# Patient Record
Sex: Female | Born: 1935 | Race: White | Hispanic: No | Smoking: Former smoker
Health system: Southern US, Community
[De-identification: ages and names within clinical notes are randomized; demographics above are authoritative.]

## PROBLEM LIST (undated history)

## (undated) DIAGNOSIS — B029 Zoster without complications: Secondary | ICD-10-CM

## (undated) DIAGNOSIS — E785 Hyperlipidemia, unspecified: Secondary | ICD-10-CM

## (undated) DIAGNOSIS — M810 Age-related osteoporosis without current pathological fracture: Secondary | ICD-10-CM

## (undated) DIAGNOSIS — E559 Vitamin D deficiency, unspecified: Secondary | ICD-10-CM

## (undated) DIAGNOSIS — C3410 Malignant neoplasm of upper lobe, unspecified bronchus or lung: Secondary | ICD-10-CM

## (undated) DIAGNOSIS — I10 Essential (primary) hypertension: Secondary | ICD-10-CM

## (undated) DIAGNOSIS — J439 Emphysema, unspecified: Secondary | ICD-10-CM

## (undated) DIAGNOSIS — Z902 Acquired absence of lung [part of]: Secondary | ICD-10-CM

## (undated) DIAGNOSIS — I700 Atherosclerosis of aorta: Secondary | ICD-10-CM

## (undated) HISTORY — DX: Emphysema, unspecified: J43.9

## (undated) HISTORY — PX: CARPAL TUNNEL RELEASE: SHX101

## (undated) HISTORY — DX: Vitamin D deficiency, unspecified: E55.9

## (undated) HISTORY — PX: SHOULDER ADHESION RELEASE: SHX773

## (undated) HISTORY — DX: Malignant neoplasm of upper lobe, unspecified bronchus or lung: C34.10

## (undated) HISTORY — DX: Age-related osteoporosis without current pathological fracture: M81.0

## (undated) HISTORY — DX: Zoster without complications: B02.9

## (undated) HISTORY — DX: Hyperlipidemia, unspecified: E78.5

## (undated) HISTORY — DX: Essential (primary) hypertension: I10

## (undated) HISTORY — DX: Acquired absence of lung (part of): Z90.2

## (undated) HISTORY — DX: Atherosclerosis of aorta: I70.0

## (undated) HISTORY — PX: LUNG REMOVAL, PARTIAL: SHX233

---

## 1996-12-04 DIAGNOSIS — C3410 Malignant neoplasm of upper lobe, unspecified bronchus or lung: Secondary | ICD-10-CM

## 1996-12-04 DIAGNOSIS — B029 Zoster without complications: Secondary | ICD-10-CM

## 1996-12-04 HISTORY — DX: Zoster without complications: B02.9

## 1996-12-04 HISTORY — DX: Malignant neoplasm of upper lobe, unspecified bronchus or lung: C34.10

## 1999-09-20 ENCOUNTER — Other Ambulatory Visit: Admission: RE | Admit: 1999-09-20 | Discharge: 1999-09-20 | Payer: Self-pay | Admitting: *Deleted

## 1999-10-05 ENCOUNTER — Encounter: Admission: RE | Admit: 1999-10-05 | Discharge: 1999-10-05 | Payer: Self-pay | Admitting: *Deleted

## 1999-10-05 ENCOUNTER — Encounter: Payer: Self-pay | Admitting: *Deleted

## 1999-10-26 ENCOUNTER — Encounter: Admission: RE | Admit: 1999-10-26 | Discharge: 1999-10-26 | Payer: Self-pay | Admitting: Thoracic Surgery

## 1999-10-26 ENCOUNTER — Encounter: Payer: Self-pay | Admitting: Thoracic Surgery

## 2000-04-26 ENCOUNTER — Encounter: Payer: Self-pay | Admitting: *Deleted

## 2000-04-26 ENCOUNTER — Encounter: Admission: RE | Admit: 2000-04-26 | Discharge: 2000-04-26 | Payer: Self-pay | Admitting: *Deleted

## 2000-07-04 ENCOUNTER — Encounter: Admission: RE | Admit: 2000-07-04 | Discharge: 2000-07-04 | Payer: Self-pay | Admitting: Thoracic Surgery

## 2000-07-04 ENCOUNTER — Encounter: Payer: Self-pay | Admitting: Thoracic Surgery

## 2000-09-19 ENCOUNTER — Other Ambulatory Visit: Admission: RE | Admit: 2000-09-19 | Discharge: 2000-09-19 | Payer: Self-pay | Admitting: *Deleted

## 2000-11-21 ENCOUNTER — Encounter: Admission: RE | Admit: 2000-11-21 | Discharge: 2000-11-21 | Payer: Self-pay | Admitting: Thoracic Surgery

## 2000-11-21 ENCOUNTER — Encounter: Payer: Self-pay | Admitting: Thoracic Surgery

## 2001-05-06 ENCOUNTER — Encounter: Payer: Self-pay | Admitting: Hematology & Oncology

## 2001-05-06 ENCOUNTER — Encounter: Admission: RE | Admit: 2001-05-06 | Discharge: 2001-05-06 | Payer: Self-pay | Admitting: Hematology & Oncology

## 2001-05-22 ENCOUNTER — Encounter: Payer: Self-pay | Admitting: Thoracic Surgery

## 2001-05-22 ENCOUNTER — Encounter: Admission: RE | Admit: 2001-05-22 | Discharge: 2001-05-22 | Payer: Self-pay | Admitting: Thoracic Surgery

## 2001-09-04 ENCOUNTER — Other Ambulatory Visit: Admission: RE | Admit: 2001-09-04 | Discharge: 2001-09-04 | Payer: Self-pay | Admitting: *Deleted

## 2001-10-23 ENCOUNTER — Ambulatory Visit (HOSPITAL_COMMUNITY): Admission: RE | Admit: 2001-10-23 | Discharge: 2001-10-23 | Payer: Self-pay | Admitting: Gastroenterology

## 2001-10-23 ENCOUNTER — Encounter (INDEPENDENT_AMBULATORY_CARE_PROVIDER_SITE_OTHER): Payer: Self-pay | Admitting: Specialist

## 2001-11-19 ENCOUNTER — Encounter: Payer: Self-pay | Admitting: Thoracic Surgery

## 2001-11-19 ENCOUNTER — Encounter: Admission: RE | Admit: 2001-11-19 | Discharge: 2001-11-19 | Payer: Self-pay | Admitting: Thoracic Surgery

## 2001-12-13 ENCOUNTER — Encounter: Admission: RE | Admit: 2001-12-13 | Discharge: 2001-12-13 | Payer: Self-pay | Admitting: Thoracic Surgery

## 2001-12-13 ENCOUNTER — Encounter: Payer: Self-pay | Admitting: Thoracic Surgery

## 2002-01-08 ENCOUNTER — Encounter: Admission: RE | Admit: 2002-01-08 | Discharge: 2002-01-08 | Payer: Self-pay | Admitting: Thoracic Surgery

## 2002-01-08 ENCOUNTER — Encounter: Payer: Self-pay | Admitting: Thoracic Surgery

## 2002-03-11 ENCOUNTER — Encounter: Admission: RE | Admit: 2002-03-11 | Discharge: 2002-03-11 | Payer: Self-pay | Admitting: Thoracic Surgery

## 2002-03-11 ENCOUNTER — Encounter: Payer: Self-pay | Admitting: Thoracic Surgery

## 2002-04-23 ENCOUNTER — Ambulatory Visit (HOSPITAL_COMMUNITY): Admission: RE | Admit: 2002-04-23 | Discharge: 2002-04-23 | Payer: Self-pay | Admitting: Internal Medicine

## 2002-04-23 ENCOUNTER — Encounter: Payer: Self-pay | Admitting: Internal Medicine

## 2002-05-06 ENCOUNTER — Encounter: Payer: Self-pay | Admitting: Internal Medicine

## 2002-05-06 ENCOUNTER — Encounter: Admission: RE | Admit: 2002-05-06 | Discharge: 2002-05-06 | Payer: Self-pay | Admitting: Internal Medicine

## 2002-08-20 ENCOUNTER — Encounter: Payer: Self-pay | Admitting: Thoracic Surgery

## 2002-08-20 ENCOUNTER — Encounter: Admission: RE | Admit: 2002-08-20 | Discharge: 2002-08-20 | Payer: Self-pay | Admitting: Thoracic Surgery

## 2002-10-05 ENCOUNTER — Encounter: Admission: RE | Admit: 2002-10-05 | Discharge: 2002-10-05 | Payer: Self-pay | Admitting: Orthopedic Surgery

## 2002-10-05 ENCOUNTER — Encounter: Payer: Self-pay | Admitting: Orthopedic Surgery

## 2002-10-14 ENCOUNTER — Other Ambulatory Visit: Admission: RE | Admit: 2002-10-14 | Discharge: 2002-10-14 | Payer: Self-pay | Admitting: Internal Medicine

## 2003-02-17 ENCOUNTER — Encounter: Payer: Self-pay | Admitting: Thoracic Surgery

## 2003-02-17 ENCOUNTER — Encounter: Admission: RE | Admit: 2003-02-17 | Discharge: 2003-02-17 | Payer: Self-pay | Admitting: Thoracic Surgery

## 2003-05-19 ENCOUNTER — Encounter: Admission: RE | Admit: 2003-05-19 | Discharge: 2003-05-19 | Payer: Self-pay | Admitting: Internal Medicine

## 2003-05-19 ENCOUNTER — Encounter: Payer: Self-pay | Admitting: Internal Medicine

## 2003-08-19 ENCOUNTER — Encounter: Admission: RE | Admit: 2003-08-19 | Discharge: 2003-08-19 | Payer: Self-pay | Admitting: Thoracic Surgery

## 2003-08-19 ENCOUNTER — Encounter: Payer: Self-pay | Admitting: Thoracic Surgery

## 2003-09-23 ENCOUNTER — Ambulatory Visit (HOSPITAL_BASED_OUTPATIENT_CLINIC_OR_DEPARTMENT_OTHER): Admission: RE | Admit: 2003-09-23 | Discharge: 2003-09-23 | Payer: Self-pay | Admitting: Orthopedic Surgery

## 2003-09-23 ENCOUNTER — Ambulatory Visit (HOSPITAL_COMMUNITY): Admission: RE | Admit: 2003-09-23 | Discharge: 2003-09-23 | Payer: Self-pay | Admitting: Orthopedic Surgery

## 2004-02-17 ENCOUNTER — Encounter: Admission: RE | Admit: 2004-02-17 | Discharge: 2004-02-17 | Payer: Self-pay | Admitting: Thoracic Surgery

## 2004-05-26 ENCOUNTER — Encounter: Admission: RE | Admit: 2004-05-26 | Discharge: 2004-05-26 | Payer: Self-pay | Admitting: Internal Medicine

## 2004-09-29 ENCOUNTER — Encounter: Admission: RE | Admit: 2004-09-29 | Discharge: 2004-09-29 | Payer: Self-pay | Admitting: Thoracic Surgery

## 2004-10-20 ENCOUNTER — Other Ambulatory Visit: Admission: RE | Admit: 2004-10-20 | Discharge: 2004-10-20 | Payer: Self-pay | Admitting: Internal Medicine

## 2005-03-15 ENCOUNTER — Encounter: Admission: RE | Admit: 2005-03-15 | Discharge: 2005-03-15 | Payer: Self-pay | Admitting: Thoracic Surgery

## 2005-06-27 ENCOUNTER — Encounter: Admission: RE | Admit: 2005-06-27 | Discharge: 2005-06-27 | Payer: Self-pay | Admitting: Internal Medicine

## 2005-09-13 ENCOUNTER — Encounter: Admission: RE | Admit: 2005-09-13 | Discharge: 2005-09-13 | Payer: Self-pay | Admitting: Thoracic Surgery

## 2006-03-27 ENCOUNTER — Encounter: Admission: EM | Admit: 2006-03-27 | Discharge: 2006-03-27 | Payer: Self-pay | Admitting: Gastroenterology

## 2006-03-27 ENCOUNTER — Encounter: Admission: RE | Admit: 2006-03-27 | Discharge: 2006-03-27 | Payer: Self-pay | Admitting: Thoracic Surgery

## 2006-07-03 ENCOUNTER — Encounter: Admission: RE | Admit: 2006-07-03 | Discharge: 2006-07-03 | Payer: Self-pay | Admitting: Internal Medicine

## 2006-10-17 ENCOUNTER — Encounter: Admission: RE | Admit: 2006-10-17 | Discharge: 2006-10-17 | Payer: Self-pay | Admitting: Thoracic Surgery

## 2006-11-07 ENCOUNTER — Other Ambulatory Visit: Admission: RE | Admit: 2006-11-07 | Discharge: 2006-11-07 | Payer: Self-pay | Admitting: Internal Medicine

## 2007-04-23 ENCOUNTER — Ambulatory Visit: Payer: Self-pay | Admitting: Thoracic Surgery

## 2007-04-23 ENCOUNTER — Encounter: Admission: RE | Admit: 2007-04-23 | Discharge: 2007-04-23 | Payer: Self-pay | Admitting: Thoracic Surgery

## 2007-08-02 ENCOUNTER — Encounter: Admission: RE | Admit: 2007-08-02 | Discharge: 2007-08-02 | Payer: Self-pay | Admitting: Internal Medicine

## 2007-10-16 ENCOUNTER — Ambulatory Visit: Payer: Self-pay | Admitting: Thoracic Surgery

## 2007-10-16 ENCOUNTER — Encounter: Admission: RE | Admit: 2007-10-16 | Discharge: 2007-10-16 | Payer: Self-pay | Admitting: Thoracic Surgery

## 2008-04-14 ENCOUNTER — Ambulatory Visit: Payer: Self-pay | Admitting: Thoracic Surgery

## 2008-04-14 ENCOUNTER — Encounter: Admission: RE | Admit: 2008-04-14 | Discharge: 2008-04-14 | Payer: Self-pay | Admitting: Thoracic Surgery

## 2008-04-25 ENCOUNTER — Emergency Department (HOSPITAL_COMMUNITY): Admission: EM | Admit: 2008-04-25 | Discharge: 2008-04-25 | Payer: Self-pay | Admitting: Emergency Medicine

## 2008-08-03 ENCOUNTER — Encounter: Admission: RE | Admit: 2008-08-03 | Discharge: 2008-08-03 | Payer: Self-pay | Admitting: Internal Medicine

## 2008-08-04 ENCOUNTER — Ambulatory Visit (HOSPITAL_BASED_OUTPATIENT_CLINIC_OR_DEPARTMENT_OTHER): Admission: RE | Admit: 2008-08-04 | Discharge: 2008-08-04 | Payer: Self-pay | Admitting: Orthopedic Surgery

## 2008-10-07 ENCOUNTER — Ambulatory Visit: Payer: Self-pay

## 2008-10-07 ENCOUNTER — Encounter (INDEPENDENT_AMBULATORY_CARE_PROVIDER_SITE_OTHER): Payer: Self-pay | Admitting: Internal Medicine

## 2008-10-20 ENCOUNTER — Encounter: Admission: RE | Admit: 2008-10-20 | Discharge: 2008-10-20 | Payer: Self-pay | Admitting: Thoracic Surgery

## 2008-10-20 ENCOUNTER — Ambulatory Visit: Payer: Self-pay | Admitting: Thoracic Surgery

## 2008-12-23 ENCOUNTER — Encounter: Admission: RE | Admit: 2008-12-23 | Discharge: 2008-12-23 | Payer: Self-pay | Admitting: Thoracic Surgery

## 2008-12-23 ENCOUNTER — Ambulatory Visit: Payer: Self-pay | Admitting: Thoracic Surgery

## 2009-06-23 ENCOUNTER — Ambulatory Visit: Payer: Self-pay | Admitting: Thoracic Surgery

## 2009-06-23 ENCOUNTER — Encounter: Admission: RE | Admit: 2009-06-23 | Discharge: 2009-06-23 | Payer: Self-pay | Admitting: Thoracic Surgery

## 2009-08-04 ENCOUNTER — Encounter: Admission: RE | Admit: 2009-08-04 | Discharge: 2009-08-04 | Payer: Self-pay | Admitting: Internal Medicine

## 2010-01-19 ENCOUNTER — Ambulatory Visit: Payer: Self-pay | Admitting: Thoracic Surgery

## 2010-01-19 ENCOUNTER — Encounter: Admission: RE | Admit: 2010-01-19 | Discharge: 2010-01-19 | Payer: Self-pay | Admitting: Thoracic Surgery

## 2010-09-01 ENCOUNTER — Encounter: Admission: RE | Admit: 2010-09-01 | Discharge: 2010-09-01 | Payer: Self-pay | Admitting: Internal Medicine

## 2010-12-24 ENCOUNTER — Other Ambulatory Visit: Payer: Self-pay | Admitting: Thoracic Surgery

## 2010-12-24 DIAGNOSIS — R911 Solitary pulmonary nodule: Secondary | ICD-10-CM

## 2011-01-13 ENCOUNTER — Other Ambulatory Visit: Payer: Self-pay | Admitting: Gastroenterology

## 2011-01-18 ENCOUNTER — Ambulatory Visit (INDEPENDENT_AMBULATORY_CARE_PROVIDER_SITE_OTHER): Payer: Medicare Other | Admitting: Thoracic Surgery

## 2011-01-18 ENCOUNTER — Ambulatory Visit
Admission: RE | Admit: 2011-01-18 | Discharge: 2011-01-18 | Disposition: A | Discharge status: Home / Self Care | Payer: Medicare Other | Source: Ambulatory Visit | Attending: Gastroenterology | Admitting: Gastroenterology

## 2011-01-18 ENCOUNTER — Other Ambulatory Visit: Payer: Self-pay

## 2011-01-18 ENCOUNTER — Ambulatory Visit
Admission: RE | Admit: 2011-01-18 | Discharge: 2011-01-18 | Disposition: A | Discharge status: Home / Self Care | Payer: Medicare Other | Source: Ambulatory Visit | Attending: Thoracic Surgery | Admitting: Thoracic Surgery

## 2011-01-18 DIAGNOSIS — R911 Solitary pulmonary nodule: Secondary | ICD-10-CM

## 2011-01-18 DIAGNOSIS — C3410 Malignant neoplasm of upper lobe, unspecified bronchus or lung: Secondary | ICD-10-CM

## 2011-01-18 NOTE — Letter (Signed)
January 18, 2011  Lovenia Kim, DO [address], [place] [place], [place] [address]  Re:  [name], Penny D                  DOB:  [birth year]  Dear Dr. Elisabeth Most:  I saw the patient back today and did a CT scan which we have been doing yearly.  She had lung cancer approximately 10 years ago.  There is no evidence of recurrence.  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  Her blood pressure is 107/67, pulse 100, respirations 20, sats were 98%.  I recommend she get at least a chest x-ray once a year from now on since there is a 3% chance of recurrence cancer, even at this second cancer. I doubt, she has since 10 years, she will not have recurrence of original cancer.  I will refer her back to you for long-term followup.  I appreciate the opportunity of seeing the patient.  Sincerely,  [name], M.D. Electronically Signed  DPB/MEDQ  D:  01/18/2011  T:  01/18/2011  Job:  [id number]

## 2011-01-19 NOTE — Letter (Signed)
January 18, 2011  Lovenia Kim, DO [address], [place] [place], [place] [address]  Re:  [name], Penny D                  DOB:  02-Jun-1933  Dear Dr. Elisabeth Most:  I saw the patient back for a yearly CT scan that shows no evidence of recurrence of the cancer.  The upper abdomen did show moderate-sized duodenum and diverticulum.  She is doing well overall and has had no medical problems.  Her blood pressure was 107/67, pulse 88, respirations 20, and sats were 96%.  I will refer her back to you for long-term followup.  She has a 3% risk for a year for this to develop.  I would suggest she get at least a chest x-ray once a year.  I will be happy to see her again if she develops any future problems.  Sincerely,  [name], M.D. Electronically Signed  DPB/MEDQ  D:  01/18/2011  T:  01/19/2011  Job:  [id number]

## 2011-04-03 ENCOUNTER — Other Ambulatory Visit (HOSPITAL_COMMUNITY)
Admission: RE | Admit: 2011-04-03 | Discharge: 2011-04-03 | Disposition: A | Discharge status: Home / Self Care | Payer: Medicare Other | Source: Ambulatory Visit | Attending: Internal Medicine | Admitting: Internal Medicine

## 2011-04-03 ENCOUNTER — Other Ambulatory Visit: Payer: Self-pay | Admitting: Internal Medicine

## 2011-04-03 DIAGNOSIS — Z01419 Encounter for gynecological examination (general) (routine) without abnormal findings: Secondary | ICD-10-CM | POA: Insufficient documentation

## 2011-04-18 NOTE — Assessment & Plan Note (Signed)
OFFICE VISIT   Keirsey, Michalina D  DOB:  [birth year]                                        Apr 14, 2008  CHART #:  [id number]   Ms. Newbury came today.  She is now 11 years since her surgery.  I  continue to follow her on a biannual basis.  Her CT scan showed no  evidence of recurrence.  Her lungs are clear to auscultation and  percussion.  Heart:  Regular, rate and rhythm.  Her blood pressure is  130/71 and pulse 86,  respirations 18, and saturations were 95%.   I plan to see her back again in 6 months with another chest x-ray as she  prefers to have [place] continue to follow her.   [name], M.D.  Electronically Signed   DPB/MEDQ  D:  04/14/2008  T:  04/14/2008  Job:  [id number]

## 2011-04-18 NOTE — Op Note (Signed)
NAME:  [name], [name]                  ACCOUNT NO.:  [id number]   MEDICAL RECORD NO.:  [id number]          PATIENT TYPE:  AMB   LOCATION:  DSC                          FACILITY:  [facility]   PHYSICIAN:  Cindee Salt, M.D.       DATE OF BIRTH:  [birth year]   DATE OF PROCEDURE:  08/04/2008  DATE OF DISCHARGE:                               OPERATIVE REPORT   PREOPERATIVE DIAGNOSIS:  Carpal tunnel syndrome right hand.   POSTOPERATIVE DIAGNOSIS:  Carpal tunnel syndrome right hand.   OPERATION:  Decompression right median nerve.   SURGEON:  Cindee Salt, MD   ANESTHESIA:  Forearm based IV regional.   HISTORY:  The patient is a 77 year old female with history of carpal  tunnel syndrome, EMG nerve conduction is positive.  This has not  responded to conservative treatment.  She has been referred for  consultation and treatment.  She has elected to undergo surgical  decompression.   Pre, peri, and postoperative course have been discussed along with the  risks and complications.  She is aware that there is no guarantee with  the surgery, possibility of infection, recurrence injury to arteries,  nerves, tendons, incomplete relief of symptoms, and dystrophy.  In the  preoperative area, the patient is seen.  The extremity marked by both  the patient and surgeon.  Antibiotic given.   PROCEDURE:  The patient is brought to the operating room where a forearm  based IV regional anesthetic was carried out without difficulty under  the direction of Dr. Katrinka Blazing.  She was prepped using DuraPrep, supine  position, right arm free.  A time-out was taken.  A longitudinal  incision was made in the palm and carried down through subcutaneous  tissue.  Bleeders were electrocauterized.  Palmar fascia was split.  Superficial palmar arch identified.  The flexor tendon to the ring and  little finger identified.  To the ulnar side of the median nerve, the  carpal retinaculum was incised with sharp dissection.  A right-angle  and  Sewall retractor were placed between skin and forearm fascia.  The  fascia released for approximately a centimeter and half proximal to the  wrist crease under direct vision.  Canal was explored.  No further  lesions were identified.  The wound was irrigated.  The skin closed with  interrupted 5-0 Vicryl Rapide sutures.  A sterile compressive dressing  and splint to the wrist applied.  The patient tolerated the procedure  well and was taken to the recovery room for observation in satisfactory  condition.  She will be discharged home to return to the [facility] in 1 week on Vicodin.           ______________________________  Cindee Salt, M.D.    GK/MEDQ  D:  08/04/2008  T:  08/04/2008  Job:  [id number]   cc:   Lovenia Kim, D.O.

## 2011-04-18 NOTE — Letter (Signed)
October 20, 2008   Lovenia Kim, DO  [address], [place]  [place]  [place] [address]   Re:  [name], Penny D                  DOB:  01/20/1933   Dear Dr. Elisabeth Most:   I saw the patient today for her routine followup.  A chest x-ray showed  a new big nodular density in the right lower lobe.  This was not present  on her previous CT scan.  It is a very nondescript density, so I am not  really sure that it is a cancer or not.  We usually get a CT scan in 6  months, but instead we will just shorten that to 2 months and see her  back at that time with a CT scan.  I did tell her this, but it looks to  me like this is probably or not anything she has had a recent upper  respiratory infection.  I appreciate the opportunity of seeing the  patient.  Her blood pressure was 146/84, pulse 100, respirations 18, and  sats were 94%.  She had some rhinorrhea, but her lungs were clear to  auscultation and percussion.  I will see her back again in 2 months with  a CT scan.   [name], M.[name].  Electronically Signed   DPB/MEDQ  [name]:  10/20/2008  T:  10/20/2008  Job:  [id number]

## 2011-04-18 NOTE — Letter (Signed)
December 23, 2008   Lovenia Kim, DO  [address] [place]  [place]  [place] [address]   Re:  Penny [name], Penny D                  DOB:  April 02, 1933   Dear Dr. Elisabeth Most:   I saw the patient back today.  We repeated a CT scan and as you remember  her chest x-ray showed a questionable new nodular density in the right  lung, so we moved her CT scan up from 6 months to 2 months and it was  stable changes.  No evidence of any recurrence of disease.  I informed  the patient of this.  We will see her again in 6 months with a chest x-  ray.  Her blood pressure is 152/87, pulse 79, respirations 18, and sats  were 98%.  Lungs are clear to auscultation and percussion.   [name], M.[name].  Electronically Signed   DPB/MEDQ  [name]:  12/23/2008  T:  12/24/2008  Job:  [id number]

## 2011-04-18 NOTE — Assessment & Plan Note (Signed)
OFFICE VISIT   Penny [name], Penny [name]  DOB:  Jul 30, 1933                                        October 16, 2007  CHART #:  [id number]   [name] came for follow-up today.  She is now 9 to 10 years since her  surgery.  Her chest x-ray was stable after a right upper lobectomy.  Blood pressure is 160/98, pulse 93, respirations 18, sats were 96%.  Lungs were clear to auscultation and  percussion. Overall she is doing  well.  I plan to see her back again in 6 months with a CT scan of the  chest.   [name], M.[name].  Electronically Signed   DPB/MEDQ  [name]:  10/16/2007  T:  10/17/2007  Job:  [id number]

## 2011-04-18 NOTE — Assessment & Plan Note (Signed)
OFFICE VISIT   Wahab, Naleah D  DOB:  [birth year]                                        June 23, 2009  CHART #:  [id number]   The patient came back today.  Her chest x-ray is stable with no evidence  of recurrence and no other new nodule.  She had a recent nodule that we  saw over the last 6 months that resolved.  Because of this, we will have  to repeat her CT scan again in about 6 months, but she is still doing  well.  She is now 12 years since her resection.   [name], M.D.  Electronically Signed   DPB/MEDQ  D:  06/23/2009  T:  06/24/2009  Job:  [id number]

## 2011-04-18 NOTE — Assessment & Plan Note (Signed)
OFFICE VISIT   [name], Penny [name]  DOB:  1933/07/24                                        January 19, 2010  CHART #:  [id number]   The patient came today, and her CT scan showed no evidence of recurrence  of her cancer.  She is now over 10 years since her surgery.  Her blood  pressure is 145/84, pulse 81, respirations 18, sats were 94%.  She has  had no real major problems since we saw her back.  I plan to see her  back again in 1 year with another CT scan.   [name], M.[name].  Electronically Signed   DPB/MEDQ  [name]:  01/19/2010  T:  01/20/2010  Job:  [id number]

## 2011-04-21 NOTE — Op Note (Signed)
NAME:  Penny [name], Penny [name]                            ACCOUNT NO.:  [id number]   MEDICAL RECORD NO.:  [id number]                   PATIENT TYPE:  AMB   LOCATION:  DSC                                  FACILITY:  [facility]   PHYSICIAN:  Mila Homer. Sherlean Foot, M.[name].              DATE OF BIRTH:  1933-07-04   DATE OF PROCEDURE:  09/23/2003  DATE OF DISCHARGE:                                 OPERATIVE REPORT   SURGEON:  Mila Homer. Sherlean Foot, M.[name].   ASSISTANT:  None.   PREOPERATIVE DIAGNOSIS:  Left shoulder impingement syndrome.   POSTOPERATIVE DIAGNOSIS:  Left shoulder impingement syndrome.   OPERATION/PROCEDURE:  Left shoulder arthroscopy with subacromial  decompression and labral debridement.   INDICATIONS FOR PROCEDURE:  The patient is a 77 year old, status post  injury, and also what appears to be overlying impingement syndrome.  MRI's  were done.  Did not verify a full-thickness rotator cuff tear but tendinosis  and a type 2 acromion.  Informed consent was obtained.   DESCRIPTION OF PROCEDURE:  The patient was taken to the operating room and  administered general anesthesia.  She was then placed in the beach chair  position and left shoulder was prepped and draped in the usual sterile  fashion.  Anterior, posterior and direct lateral portals were created with a  #11 blade, blunt trocar and cannula.  From the posterior portal into the  glenohumeral joint with the camera.  I placed the Gator shaver in the  anterior portal and debrided some frayed labral tearing which went from  approximately the 4 o'clock position to the 12 o'clock position.  There was  also a partial-thickness subscapularis tear which was just some fraying of  the subscapularis tendon which I debrided as well as well as some fraying on  the biceps tendon at the rotator cuff interval.  It appeared that there was  a very [place] tearing of the rotator interval.  This was debrided and I could  not see all the way through.  I did place a  needle with a blue Prolene  stitch through it so I could identify when we got in the subacromial space.  I then went from the posterior portal into the subacromial space and created  the direct lateral portal to go in with the shaver and perform an aggressive  bursectomy.  I found the Prolene stitch and debrided aggressively but it was  not a full-thickness tear that I could see needed to be repaired.  After I  completed the bursectomy, I used the ArthroCare Wand to further debride the  bursa off the undersurface of the acromion.  I then performed an aggressive  anterolateral acromioplasty with a 4.0 mm cylindrical bur.  I did not  violate the AC joint.  I then released the CA ligament with the ArthroCare  debridement wand and debrided it back to a stable piece of tissue.  I then  checked our impingement and our 30/30 test was positive for adequate bone  resection.  I then again went through aggressive range of motion, searching  for any full-thickness tears and could not find any.  So I, therefore,  removed the instruments and fluid, closed with interrupted 4-0 nylon  sutures, dressed with Adaptic 4 x 4's, ABD's, 2-inch silk tape, and a simple  sling.  The patient tolerated the procedure well.  Complications none.  Drains none.                                               Mila Homer. Sherlean Foot, M.[name].   SDL/MEDQ  [name]:  09/23/2003  T:  09/23/2003  Job:  [id number]

## 2011-08-25 ENCOUNTER — Other Ambulatory Visit: Payer: Self-pay | Admitting: Internal Medicine

## 2011-08-25 DIAGNOSIS — Z1231 Encounter for screening mammogram for malignant neoplasm of breast: Secondary | ICD-10-CM

## 2011-09-05 ENCOUNTER — Ambulatory Visit
Admission: RE | Admit: 2011-09-05 | Discharge: 2011-09-05 | Disposition: A | Discharge status: Home / Self Care | Payer: Medicare Other | Source: Ambulatory Visit | Attending: Internal Medicine | Admitting: Internal Medicine

## 2011-09-05 DIAGNOSIS — Z1231 Encounter for screening mammogram for malignant neoplasm of breast: Secondary | ICD-10-CM

## 2011-09-06 LAB — POCT HEMOGLOBIN-HEMACUE: Hemoglobin: 13.4

## 2012-04-02 ENCOUNTER — Ambulatory Visit (HOSPITAL_COMMUNITY)
Admission: RE | Admit: 2012-04-02 | Discharge: 2012-04-02 | Disposition: A | Discharge status: Home / Self Care | Payer: Medicare Other | Source: Ambulatory Visit | Attending: Internal Medicine | Admitting: Internal Medicine

## 2012-04-02 ENCOUNTER — Other Ambulatory Visit (HOSPITAL_COMMUNITY): Payer: Self-pay | Admitting: Internal Medicine

## 2012-04-02 DIAGNOSIS — Z85118 Personal history of other malignant neoplasm of bronchus and lung: Secondary | ICD-10-CM | POA: Insufficient documentation

## 2012-04-02 DIAGNOSIS — I10 Essential (primary) hypertension: Secondary | ICD-10-CM

## 2012-07-09 ENCOUNTER — Ambulatory Visit (HOSPITAL_COMMUNITY)
Admission: RE | Admit: 2012-07-09 | Discharge: 2012-07-09 | Disposition: A | Discharge status: Home / Self Care | Payer: Medicare Other | Source: Ambulatory Visit | Attending: Internal Medicine | Admitting: Internal Medicine

## 2012-07-09 ENCOUNTER — Other Ambulatory Visit (HOSPITAL_COMMUNITY): Payer: Self-pay | Admitting: Internal Medicine

## 2012-07-09 DIAGNOSIS — M545 Low back pain, unspecified: Secondary | ICD-10-CM | POA: Insufficient documentation

## 2012-07-09 DIAGNOSIS — G9589 Other specified diseases of spinal cord: Secondary | ICD-10-CM | POA: Insufficient documentation

## 2012-09-20 ENCOUNTER — Other Ambulatory Visit: Payer: Self-pay | Admitting: Internal Medicine

## 2012-09-20 DIAGNOSIS — Z1231 Encounter for screening mammogram for malignant neoplasm of breast: Secondary | ICD-10-CM

## 2012-10-22 ENCOUNTER — Ambulatory Visit
Admission: RE | Admit: 2012-10-22 | Discharge: 2012-10-22 | Disposition: A | Discharge status: Home / Self Care | Payer: Medicare Other | Source: Ambulatory Visit | Attending: Internal Medicine | Admitting: Internal Medicine

## 2012-10-22 DIAGNOSIS — Z1231 Encounter for screening mammogram for malignant neoplasm of breast: Secondary | ICD-10-CM

## 2013-04-09 ENCOUNTER — Ambulatory Visit (HOSPITAL_COMMUNITY)
Admission: RE | Admit: 2013-04-09 | Discharge: 2013-04-09 | Disposition: A | Discharge status: Home / Self Care | Payer: Medicare Other | Source: Ambulatory Visit | Attending: Internal Medicine | Admitting: Internal Medicine

## 2013-04-09 ENCOUNTER — Other Ambulatory Visit (HOSPITAL_COMMUNITY): Payer: Self-pay | Admitting: Internal Medicine

## 2013-04-09 DIAGNOSIS — I10 Essential (primary) hypertension: Secondary | ICD-10-CM

## 2013-09-27 ENCOUNTER — Encounter: Payer: Self-pay | Admitting: Internal Medicine

## 2013-09-29 LAB — HM DEXA SCAN: HM Dexa Scan: 2011

## 2013-09-29 LAB — HM COLONOSCOPY: HM Colonoscopy: 2006

## 2013-09-29 LAB — HM MAMMOGRAPHY: HM Mammogram: 2013

## 2013-10-06 ENCOUNTER — Other Ambulatory Visit: Payer: Self-pay

## 2013-10-06 DIAGNOSIS — Z1231 Encounter for screening mammogram for malignant neoplasm of breast: Secondary | ICD-10-CM

## 2013-10-09 ENCOUNTER — Ambulatory Visit: Payer: Medicare Other | Admitting: Internal Medicine

## 2013-10-09 ENCOUNTER — Encounter (INDEPENDENT_AMBULATORY_CARE_PROVIDER_SITE_OTHER): Payer: Self-pay

## 2013-10-09 ENCOUNTER — Encounter: Payer: Self-pay | Admitting: Internal Medicine

## 2013-10-09 VITALS — BP 114/72 | HR 64 | Temp 97.9°F | Resp 18 | Ht 62.0 in | Wt 145.2 lb

## 2013-10-09 DIAGNOSIS — Z79899 Other long term (current) drug therapy: Secondary | ICD-10-CM

## 2013-10-09 DIAGNOSIS — E782 Mixed hyperlipidemia: Secondary | ICD-10-CM

## 2013-10-09 DIAGNOSIS — E559 Vitamin D deficiency, unspecified: Secondary | ICD-10-CM

## 2013-10-09 DIAGNOSIS — I10 Essential (primary) hypertension: Secondary | ICD-10-CM

## 2013-10-09 DIAGNOSIS — R7309 Other abnormal glucose: Secondary | ICD-10-CM

## 2013-10-09 LAB — LIPID PANEL
HDL: 45 mg/dL (ref >39)
Total CHOL/HDL Ratio: 3.5 Ratio
VLDL: 36 mg/dL (ref 0–40)

## 2013-10-09 LAB — CBC WITH DIFFERENTIAL/PLATELET
Basophils Absolute: 0 10*3/uL (ref 0.0–0.1)
HCT: 38.6 % (ref 36.0–46.0)
Lymphocytes Relative: 33 % (ref 12–46)
Lymphs Abs: 1.6 10*3/uL (ref 0.7–4.0)
Neutro Abs: 2.5 10*3/uL (ref 1.7–7.7)
Platelets: 181 10*3/uL (ref 150–400)
RBC: 4.59 MIL/uL (ref 3.87–5.11)
RDW: 14.2 % (ref 11.5–15.5)
WBC: 4.8 10*3/uL (ref 4.0–10.5)

## 2013-10-09 LAB — MAGNESIUM: Magnesium: 1.8 mg/dL (ref 1.5–2.5)

## 2013-10-09 LAB — BASIC METABOLIC PANEL WITH GFR
BUN: 21 mg/dL (ref 6–23)
CO2: 32 mEq/L (ref 19–32)
Chloride: 103 mEq/L (ref 96–112)
Creat: 0.68 mg/dL (ref 0.50–1.10)
GFR, Est Non African American: 84 mL/min
Potassium: 3.7 mEq/L (ref 3.5–5.3)

## 2013-10-09 LAB — HEPATIC FUNCTION PANEL
ALT: 12 U/L (ref 0–35)
AST: 20 U/L (ref 0–37)
Albumin: 3.9 g/dL (ref 3.5–5.2)
Bilirubin, Direct: 0.1 mg/dL (ref 0.0–0.3)
Total Bilirubin: 0.3 mg/dL (ref 0.3–1.2)

## 2013-10-09 LAB — HEMOGLOBIN A1C: Hgb A1c MFr Bld: 6.1 % — ABNORMAL HIGH (ref <5.7)

## 2013-10-09 NOTE — Patient Instructions (Signed)
Diet and meds as discussed - further disposition pending labs  Vitamin D Deficiency Vitamin D is an important vitamin that your body needs. Having too little of it in your body is called a deficiency. A very bad deficiency can make your bones soft and can cause a condition called rickets.  Vitamin D is important to your body for different reasons, such as:   It helps your body absorb 2 minerals called calcium and phosphorus.  It helps make your bones healthy.  It may prevent some diseases, such as diabetes and multiple sclerosis.  It helps your muscles and heart. You can get vitamin D in several ways. It is a natural part of some foods. The vitamin is also added to some dairy products and cereals. Some people take vitamin D supplements. Also, your body makes vitamin D when you are in the sun. It changes the sun's rays into a form of the vitamin that your body can use. CAUSES   Not eating enough foods that contain vitamin D.  Not getting enough sunlight.  Having certain digestive system diseases that make it hard to absorb vitamin D. These diseases include Crohn's disease, chronic pancreatitis, and cystic fibrosis.  Having a surgery in which part of the stomach or Bauder intestine is removed.  Being obese. Fat cells pull vitamin D out of your blood. That means that obese people may not have enough vitamin D left in their blood and in other body tissues.  Having chronic kidney or liver disease. RISK FACTORS Risk factors are things that make you more likely to develop a vitamin D deficiency. They include:  Being older.  Not being able to get outside very much.  Living in a nursing home.  Having had broken bones.  Having weak or thin bones (osteoporosis).  Having a disease or condition that changes how your body absorbs vitamin D.  Having dark skin.  Some medicines such as seizure medicines or steroids.  Being overweight or obese. SYMPTOMS Mild cases of vitamin D deficiency  may not have any symptoms. If you have a very bad case, symptoms may include:  Bone pain.  Muscle pain.  Falling often.  Broken bones caused by a minor injury, due to osteoporosis. DIAGNOSIS A blood test is the best way to tell if you have a vitamin D deficiency. TREATMENT Vitamin D deficiency can be treated in different ways. Treatment for vitamin D deficiency depends on what is causing it. Options include:  Taking vitamin D supplements.  Taking a calcium supplement. Your caregiver will suggest what dose is best for you. HOME CARE INSTRUCTIONS  Take any supplements that your caregiver prescribes. Follow the directions carefully. Take only the suggested amount.  Have your blood tested 2 months after you start taking supplements.  Eat foods that contain vitamin D. Healthy choices include:  Fortified dairy products, cereals, or juices. Fortified means vitamin D has been added to the food. Check the label on the package to be sure.  Fatty fish like salmon or trout.  Eggs.  Oysters.  Do not use a tanning bed.  Keep your weight at a healthy level. Lose weight if you need to.  Keep all follow-up appointments. Your caregiver will need to perform blood tests to make sure your vitamin D deficiency is going away. SEEK MEDICAL CARE IF:  You have any questions about your treatment.  You continue to have symptoms of vitamin D deficiency.  You have nausea or vomiting.  You are constipated.  You  feel confused.  You have severe abdominal or back pain. MAKE SURE YOU:  Understand these instructions.  Will watch your condition.  Will get help right away if you are not doing well or get worse. Document Released: 02/12/2012 Document Revised: 03/17/2013 Document Reviewed: 02/12/2012 [facility] Patient Information 2014 [place], [place]. Diabetes and Exercise Exercising regularly is important. It is not just about losing weight. It has many health benefits, such as:  Improving  your overall fitness, flexibility, and endurance.  Increasing your bone density.  Helping with weight control.  Decreasing your body fat.  Increasing your muscle strength.  Reducing stress and tension.  Improving your overall health. People with diabetes who exercise gain additional benefits because exercise:  Reduces appetite.  Improves the body's use of blood sugar (glucose).  Helps lower or control blood glucose.  Decreases blood pressure.  Helps control blood lipids (such as cholesterol and triglycerides).  Improves the body's use of the hormone insulin by:  Increasing the body's insulin sensitivity.  Reducing the body's insulin needs.  Decreases the risk for heart disease because exercising:  Lowers cholesterol and triglycerides levels.  Increases the levels of good cholesterol (such as high-density lipoproteins [HDL]) in the body.  Lowers blood glucose levels. YOUR ACTIVITY PLAN  Choose an activity that you enjoy and set realistic goals. Your health care provider or diabetes educator can help you make an activity plan that works for you. You can break activities into 2 or 3 sessions throughout the day. Doing so is as good as one long session. Exercise ideas include:  Taking the dog for a walk.  Taking the stairs instead of the elevator.  Dancing to your favorite song.  Doing your favorite exercise with a friend. RECOMMENDATIONS FOR EXERCISING WITH TYPE 1 OR TYPE 2 DIABETES   Check your blood glucose before exercising. If blood glucose levels are greater than 240 mg/dL, check for urine ketones. Do not exercise if ketones are present.  Avoid injecting insulin into areas of the body that are going to be exercised. For example, avoid injecting insulin into:  The arms when playing tennis.  The legs when jogging.  Keep a record of:  Food intake before and after you exercise.  Expected peak times of insulin action.  Blood glucose levels before and after  you exercise.  The type and amount of exercise you have done.  Review your records with your health care provider. Your health care provider will help you to develop guidelines for adjusting food intake and insulin amounts before and after exercising.  If you take insulin or oral hypoglycemic agents, watch for signs and symptoms of hypoglycemia. They include:  Dizziness.  Shaking.  Sweating.  Chills.  Confusion.  Drink plenty of water while you exercise to prevent dehydration or heat stroke. Body water is lost during exercise and must be replaced.  Talk to your health care provider before starting an exercise program to make sure it is safe for you. Remember, almost any type of activity is better than none. Document Released: 02/10/2004 Document Revised: 07/23/2013 Document Reviewed: 04/29/2013 [facility] Patient Information 2014 [place], [place]. Hypertension As your heart beats, it forces blood through your arteries. This force is your blood pressure. If the pressure is too high, it is called hypertension (HTN) or high blood pressure. HTN is dangerous because you may have it and not know it. High blood pressure may mean that your heart has to work harder to pump blood. Your arteries may be narrow or stiff.  The extra work puts you at risk for heart disease, stroke, and other problems.  Blood pressure consists of two numbers, a higher number over a lower, 110/72, for example. It is stated as "110 over 72." The ideal is below 120 for the top number (systolic) and under 80 for the bottom (diastolic). Write down your blood pressure today. You should pay close attention to your blood pressure if you have certain conditions such as:  Heart failure.  Prior heart attack.  Diabetes  Chronic kidney disease.  Prior stroke.  Multiple risk factors for heart disease. To see if you have HTN, your blood pressure should be measured while you are seated with your arm held at the level of the  heart. It should be measured at least twice. A one-time elevated blood pressure reading (especially in the Emergency Department) does not mean that you need treatment. There may be conditions in which the blood pressure is different between your right and left arms. It is important to see your caregiver soon for a recheck. Most people have essential hypertension which means that there is not a specific cause. This type of high blood pressure may be lowered by changing lifestyle factors such as:  Stress.  Smoking.  Lack of exercise.  Excessive weight.  Drug/tobacco/alcohol use.  Eating less salt. Most people do not have symptoms from high blood pressure until it has caused damage to the body. Effective treatment can often prevent, delay or reduce that damage. TREATMENT  When a cause has been identified, treatment for high blood pressure is directed at the cause. There are a large number of medications to treat HTN. These fall into several categories, and your caregiver will help you select the medicines that are best for you. Medications may have side effects. You should review side effects with your caregiver. If your blood pressure stays high after you have made lifestyle changes or started on medicines,   Your medication(s) may need to be changed.  Other problems may need to be addressed.  Be certain you understand your prescriptions, and know how and when to take your medicine.  Be sure to follow up with your caregiver within the time frame advised (usually within two weeks) to have your blood pressure rechecked and to review your medications.  If you are taking more than one medicine to lower your blood pressure, make sure you know how and at what times they should be taken. Taking two medicines at the same time can result in blood pressure that is too low. SEEK IMMEDIATE MEDICAL CARE IF:  You develop a severe headache, blurred or changing vision, or confusion.  You have unusual  weakness or numbness, or a faint feeling.  You have severe chest or abdominal pain, vomiting, or breathing problems. MAKE SURE YOU:   Understand these instructions.  Will watch your condition.  Will get help right away if you are not doing well or get worse. Document Released: 11/20/2005 Document Revised: 02/12/2012 Document Reviewed: 07/10/2008 [facility] Patient Information 2014 [place], [place]. Cholesterol Cholesterol is a white, waxy, fat-like protein needed by your body in [place] amounts. The liver makes all the cholesterol you need. It is carried from the liver by the blood through the blood vessels. Deposits (plaque) may build up on blood vessel walls. This makes the arteries narrower and stiffer. Plaque increases the risk for heart attack and stroke. You cannot feel your cholesterol level even if it is very high. The only way to know is by a  blood test to check your lipid (fats) levels. Once you know your cholesterol levels, you should keep a record of the test results. Work with your caregiver to to keep your levels in the desired range. WHAT THE RESULTS MEAN:  Total cholesterol is a rough measure of all the cholesterol in your blood.  LDL is the so-called bad cholesterol. This is the type that deposits cholesterol in the walls of the arteries. You want this level to be low.  HDL is the good cholesterol because it cleans the arteries and carries the LDL away. You want this level to be high.  Triglycerides are fat that the body can either burn for energy or store. High levels are closely linked to heart disease. DESIRED LEVELS:  Total cholesterol below 200.  LDL below 100 for people at risk, below 70 for very high risk.  HDL above 50 is good, above 60 is best.  Triglycerides below 150. HOW TO LOWER YOUR CHOLESTEROL:  Diet.  Choose fish or white meat chicken and [place], roasted or baked. Limit fatty cuts of red meat, fried foods, and processed meats, such as sausage and lunch  meat.  Eat lots of fresh fruits and vegetables. Choose whole grains, beans, pasta, potatoes and cereals.  Use only [place] amounts of olive, corn or canola oils. Avoid butter, mayonnaise, shortening or palm kernel oils. Avoid foods with trans-fats.  Use skim/nonfat milk and low-fat/nonfat yogurt and cheeses. Avoid whole milk, cream, ice cream, egg yolks and cheeses. Healthy desserts include angel food cake, ginger snaps, animal crackers, hard candy, popsicles, and low-fat/nonfat frozen yogurt. Avoid pastries, cakes, pies and cookies.  Exercise.  A regular program helps decrease LDL and raises HDL.  Helps with weight control.  Do things that increase your activity level like gardening, walking, or taking the stairs.  Medication.  May be prescribed by your caregiver to help lowering cholesterol and the risk for heart disease.  You may need medicine even if your levels are normal if you have several risk factors. HOME CARE INSTRUCTIONS   Follow your diet and exercise programs as suggested by your caregiver.  Take medications as directed.  Have blood work done when your caregiver feels it is necessary. MAKE SURE YOU:   Understand these instructions.  Will watch your condition.  Will get help right away if you are not doing well or get worse. Document Released: 08/15/2001 Document Revised: 02/12/2012 Document Reviewed: 02/05/2008 [facility] Patient Information 2014 [place], [place].

## 2013-10-09 NOTE — Progress Notes (Signed)
Patient ID: Penny [name], female   DOB: 1933/03/31, 79 y.o.   MRN: [id number]  HPI: Patient presents for 3 month follow up with hypertension, hyperlipidemia, pre-diabetes and vitamin D deficiency.  BP has been controlled at home, today's BP 114/72. Patient denies chest pain, palpitations, dyspnea/orthopnea/PND, dizziness, claudication, or edema. Dyslipidemia is controlled with diet & is on meds. Denies myalgias w/o med SE's. her last cholesterol was 164,HDL 59 and LDL 92. Also; patient has prediabetes/insulin resistance with last A1c of 5.7%.  She denies any diabetic polys, paresthesias, or visual blurring. She also has a Hx/o vitamin D deficiency with pt supplementing same.  Current outpatient prescriptions:aspirin 81 MG chewable tablet, Chew 81 mg by mouth daily., Disp: , Rfl: ;   Biotin 10 MG TABS, Take 10 mg by mouth., Disp: , Rfl: ;   cetirizine (ZYRTEC) 10 MG tablet, Take 10 mg by mouth daily., Disp: , Rfl: ;   cholecalciferol (VITAMIN D) 5000 UNITS tablet, Take 10,000 Units by mouth 4 (four) times a week and 5,000 u 3x/wk, Disp: , Rfl: ;   hydrochlorothiazide (HYDRODIURIL) 25 MG tablet, Take 25 mg by mouth daily., Disp: , Rfl:  magnesium gluconate (MAGONATE) 500 MG tablet, Take 500 mg by mouth 1 day or 1 dose., Disp: , Rfl: ;   metoprolol (LOPRESSOR) 50 MG tablet, Take 50 mg by mouth 2 (two) times daily., Disp: , Rfl: ;  mometasone (NASONEX) 50 MCG/ACT nasal spray, Place 2 sprays into the nose 2 (two) times daily as needed., Disp: , Rfl: ;   simvastatin (ZOCOR) 40 MG tablet, Take 40 mg by mouth every evening., Disp: , Rfl:  loperamide (IMODIUM) 2 MG capsule, Take 2 mg by mouth 3 (three) times a week., Disp: , Rfl:    ALLERGIES Ace inhibitors; Calcium-containing compounds; and Lactose intolerance (gi)  Systems Review: Constitutional: Denies fever, chills, wt changes, headaches, insomnia, fatigue, night sweats, change in appetite. Eyes: Denies redness, blurred vision, diplopia, discharge,  itchy, watery eyes.  ENT: Denies discharge, congestion, post nasal drip, epistaxis, sore throat, earache, hearing loss, dental pain, Tinnitus, Vertigo, Sinus pain, snoring.  CV: Denies chest pain, palpitations, irregular heartbeat, syncope, dyspnea, diaphoresis, orthopnea, PND, claudication, edema. Occasional dry cough and hoarseness she attributes to allergies Respiratory: denies cough, dyspnea, DOE, pleurisy, laryngitis, wheezing.  Gastrointestinal: Denies dysphagia, oodynophagia, heartburn, reflux, waterbrash, pain, cramps, nausea, vomiting, bloating, diarrhea, constipation, hematemesis, melena, hematochezia. Musculoskeletal: Denies arthralgias, myalgias, stiffness, Jt. Swelling, pain, limp, strain/sprain.  Skin: Denies pruritus, rash, hives, warts, acne, eczema, change in in skin lesion(s) Neuro: No weakness, tremor, incoordination, spasms, paresthesia, pain Psychiatric: Denies confusion, memory loss, sensory loss Endo: Denies change in weight, skin, hair change, nocturia, diabetic polys, paresthesias, visual blurring, hyper/hypo-glycemic episodes.  Heme/Lymph: Excessive bleeding, bruising, enlarged lymph nodes PMHx: FHx: Reviewed / unchanged SHx: Reviewed / unchanged Filed Vitals:   10/09/13 1224  BP: 114/72  Pulse: 64  Temp: 97.9 F (36.6 C)  Resp: 18   On Exam: Appears well nourished - in NAD. Eyes: PERRLA, EOMs, conjunctiva no swelling or erythema, normal fundi and vessels. Sinuses: No Frontal/maxillary tenderness ENT/Mouth: EACs clear, TMs nl w/o erythema, bulging. Nares clear w/o erythema, swelling, exudates. OroPharynx clear. No ulcers, cracking, on lips. Dentition- unremarkable. No erythema. Tongue normal, non obstructing. Tonsils not swollen or erythematous. Hearing intact.  Neck: Supple. Car 2+/2+. Thyroid nl. No LN, bruits or JVD. Chest: Respirations nl, BS equal w/o  rales, rhonci, wheezing or stridor.  Cardio: Heart sounds normal, regular rate and rhythm without  sig.  murmurs, gallops,clicks, or rubs. Peripheral pulses brisk and equal bilaterally, without edema.  Abdomen: Flat, soft, with bowl sounds. Non-tender,  no guarding, rebound, hernias, masses, or organomegaly.  Lymphatics: Non tender without lymphadenopathy.  Musculoskeletal: Full ROM all peripheral extremities, joint stability, 5/5 strength, and normal gait.  Skin: Warm, dry without rashes, lesions, ecchymosis.  Neuro: Cranial nerves intact, reflexes equal bilaterally. Sensory-motor testing grossly intact. Tendon reflexes grossly intact.  Pysch: Alert & oriented x 3. Insight and judgement nl & appropriate. No ideations Assessment and Plan: 1. Hypertension: Continue medication, monitor blood pressure at home. Continue diet/meds. 2. Cholesterol: Continue diet/meds, exercise,& lifestyle modifications. Continue monitor cholesterol/LFTs.  3. Pre-diabetes/Diabesity- Continue diet, exercise, lifestyle modifications. Monitor appropriate labs. 4. Vitamin D Deficiency- continue supplements

## 2013-10-10 LAB — INSULIN, FASTING: Insulin fasting, serum: 15 u[IU]/mL (ref 3–28)

## 2013-10-10 LAB — VITAMIN D 25 HYDROXY (VIT D DEFICIENCY, FRACTURES): Vit D, 25-Hydroxy: 90 ng/mL — ABNORMAL HIGH (ref 30–89)

## 2013-10-13 ENCOUNTER — Other Ambulatory Visit: Payer: Self-pay | Admitting: Internal Medicine

## 2013-10-13 DIAGNOSIS — I10 Essential (primary) hypertension: Secondary | ICD-10-CM

## 2013-10-13 MED ORDER — HYDROCHLOROTHIAZIDE 25 MG PO TABS: 25.0000 mg | ORAL_TABLET | Freq: Every day | ORAL | Status: DC

## 2013-11-11 ENCOUNTER — Ambulatory Visit
Admission: RE | Admit: 2013-11-11 | Discharge: 2013-11-11 | Disposition: A | Discharge status: Home / Self Care | Payer: Medicare Other | Source: Ambulatory Visit

## 2013-11-11 DIAGNOSIS — Z1231 Encounter for screening mammogram for malignant neoplasm of breast: Secondary | ICD-10-CM

## 2014-01-08 ENCOUNTER — Other Ambulatory Visit: Payer: Self-pay | Admitting: Internal Medicine

## 2014-01-09 ENCOUNTER — Ambulatory Visit (INDEPENDENT_AMBULATORY_CARE_PROVIDER_SITE_OTHER): Payer: Medicare Other | Admitting: Emergency Medicine

## 2014-01-09 ENCOUNTER — Encounter: Payer: Self-pay | Admitting: Emergency Medicine

## 2014-01-09 VITALS — BP 124/80 | HR 72 | Temp 98.0°F | Resp 18 | Ht 62.5 in | Wt 140.0 lb

## 2014-01-09 DIAGNOSIS — I10 Essential (primary) hypertension: Secondary | ICD-10-CM

## 2014-01-09 DIAGNOSIS — J309 Allergic rhinitis, unspecified: Secondary | ICD-10-CM

## 2014-01-09 DIAGNOSIS — E785 Hyperlipidemia, unspecified: Secondary | ICD-10-CM

## 2014-01-09 DIAGNOSIS — R7309 Other abnormal glucose: Secondary | ICD-10-CM

## 2014-01-09 DIAGNOSIS — E559 Vitamin D deficiency, unspecified: Secondary | ICD-10-CM

## 2014-01-09 DIAGNOSIS — C3410 Malignant neoplasm of upper lobe, unspecified bronchus or lung: Secondary | ICD-10-CM

## 2014-01-09 DIAGNOSIS — E782 Mixed hyperlipidemia: Secondary | ICD-10-CM

## 2014-01-09 LAB — CBC WITH DIFFERENTIAL/PLATELET
BASOS ABS: 0 10*3/uL (ref 0.0–0.1)
Basophils Relative: 0 % (ref 0–1)
EOS PCT: 4 % (ref 0–5)
Eosinophils Absolute: 0.2 10*3/uL (ref 0.0–0.7)
HEMATOCRIT: 40.6 % (ref 36.0–46.0)
HEMOGLOBIN: 13.1 g/dL (ref 12.0–15.0)
LYMPHS ABS: 1.6 10*3/uL (ref 0.7–4.0)
LYMPHS PCT: 32 % (ref 12–46)
MCH: 27.6 pg (ref 26.0–34.0)
MCHC: 32.3 g/dL (ref 30.0–36.0)
MCV: 85.7 fL (ref 78.0–100.0)
MONO ABS: 0.6 10*3/uL (ref 0.1–1.0)
MONOS PCT: 11 % (ref 3–12)
NEUTROS ABS: 2.7 10*3/uL (ref 1.7–7.7)
Neutrophils Relative %: 53 % (ref 43–77)
Platelets: 188 10*3/uL (ref 150–400)
RBC: 4.74 MIL/uL (ref 3.87–5.11)
RDW: 14.4 % (ref 11.5–15.5)
WBC: 5.1 10*3/uL (ref 4.0–10.5)

## 2014-01-09 LAB — BASIC METABOLIC PANEL WITH GFR
BUN: 22 mg/dL (ref 6–23)
CHLORIDE: 99 meq/L (ref 96–112)
CO2: 35 meq/L — AB (ref 19–32)
CREATININE: 0.7 mg/dL (ref 0.50–1.10)
Calcium: 9.5 mg/dL (ref 8.4–10.5)
GFR, Est African American: >89 mL/min
GFR, Est Non African American: 83 mL/min
GLUCOSE: 89 mg/dL (ref 70–99)
POTASSIUM: 4.3 meq/L (ref 3.5–5.3)
Sodium: 142 mEq/L (ref 135–145)

## 2014-01-09 LAB — HEPATIC FUNCTION PANEL
ALBUMIN: 4.1 g/dL (ref 3.5–5.2)
ALT: 14 U/L (ref 0–35)
AST: 20 U/L (ref 0–37)
Alkaline Phosphatase: 62 U/L (ref 39–117)
Bilirubin, Direct: 0.1 mg/dL (ref 0.0–0.3)
Indirect Bilirubin: 0.4 mg/dL (ref 0.2–1.2)
Total Bilirubin: 0.5 mg/dL (ref 0.2–1.2)
Total Protein: 6.7 g/dL (ref 6.0–8.3)

## 2014-01-09 LAB — HEMOGLOBIN A1C
Hgb A1c MFr Bld: 5.9 % — ABNORMAL HIGH (ref <5.7)
MEAN PLASMA GLUCOSE: 123 mg/dL — AB (ref <117)

## 2014-01-09 LAB — LIPID PANEL
CHOLESTEROL: 160 mg/dL (ref 0–200)
HDL: 53 mg/dL (ref >39)
LDL Cholesterol: 91 mg/dL (ref 0–99)
TRIGLYCERIDES: 82 mg/dL (ref <150)
Total CHOL/HDL Ratio: 3 Ratio
VLDL: 16 mg/dL (ref 0–40)

## 2014-01-09 NOTE — Patient Instructions (Signed)
Allergic Rhinitis ALLEGRA allergy over the counter or Fexofenadine Allergic rhinitis is when the mucous membranes in the nose respond to allergens. Allergens are particles in the air that cause your body to have an allergic reaction. This causes you to release allergic antibodies. Through a chain of events, these eventually cause you to release histamine into the blood stream. Although meant to protect the body, it is this release of histamine that causes your discomfort, such as frequent sneezing, congestion, and an itchy, runny nose.  CAUSES  Seasonal allergic rhinitis (hay fever) is caused by pollen allergens that may come from grasses, trees, and weeds. Year-round allergic rhinitis (perennial allergic rhinitis) is caused by allergens such as house dust mites, pet dander, and mold spores.  SYMPTOMS   Nasal stuffiness (congestion).  Itchy, runny nose with sneezing and tearing of the eyes. DIAGNOSIS  Your health care provider can help you determine the allergen or allergens that trigger your symptoms. If you and your health care provider are unable to determine the allergen, skin or blood testing may be used. TREATMENT  Allergic Rhinitis does not have a cure, but it can be controlled by:  Medicines and allergy shots (immunotherapy).  Avoiding the allergen. Hay fever may often be treated with antihistamines in pill or nasal spray forms. Antihistamines block the effects of histamine. There are over-the-counter medicines that may help with nasal congestion and swelling around the eyes. Check with your health care provider before taking or giving this medicine.  If avoiding the allergen or the medicine prescribed do not work, there are many new medicines your health care provider can prescribe. Stronger medicine may be used if initial measures are ineffective. Desensitizing injections can be used if medicine and avoidance does not work. Desensitization is when a patient is given ongoing shots until  the body becomes less sensitive to the allergen. Make sure you follow up with your health care provider if problems continue. HOME CARE INSTRUCTIONS It is not possible to completely avoid allergens, but you can reduce your symptoms by taking steps to limit your exposure to them. It helps to know exactly what you are allergic to so that you can avoid your specific triggers. SEEK MEDICAL CARE IF:   You have a fever.  You develop a cough that does not stop easily (persistent).  You have shortness of breath.  You start wheezing.  Symptoms interfere with normal daily activities. Document Released: 08/15/2001 Document Revised: 09/10/2013 Document Reviewed: 07/28/2013 [facility] Patient Information 2014 [place].

## 2014-01-09 NOTE — Progress Notes (Signed)
Subjective:    Patient ID: Penny [name], female    DOB: 1933-04-26, 80 y.o.   MRN: [id number]  HPI Comments: 80 yo female presents for 3 month F/U for HTN, Cholesterol, Pre-Dm, D. Deficient. She has decreased portions and has lost a few pounds. She is staying more active. She is not checking BP routinely.LAST LABS Chol 157 / LDL vit D 90  A1c 6.1   She has chronic runny nose with clear drainage. She has tried multiple zyrtec/ allegra/ nose sprays without relief. She notes happens without triggers. She had allergy testing at [facility] with dust allergy DX.  Hypertension  Hyperlipidemia   Current Outpatient Prescriptions on File Prior to Visit  Medication Sig Dispense Refill  . aspirin 81 MG chewable tablet Chew 81 mg by mouth daily.      . Biotin 10 MG TABS Take 10 mg by mouth.      . cholecalciferol (VITAMIN D) 1000 UNITS tablet Take 10,000 Units by mouth 4 (four) times a week.      . loperamide (IMODIUM) 2 MG capsule Take 2 mg by mouth 2 (two) times daily. [employer] brand anti-diarrhea      . magnesium gluconate (MAGONATE) 500 MG tablet Take 500 mg by mouth 1 day or 1 dose.      . metoprolol (LOPRESSOR) 50 MG tablet Take 50 mg by mouth 2 (two) times daily.      . simvastatin (ZOCOR) 40 MG tablet Take 40 mg by mouth every evening.      . hydrochlorothiazide (HYDRODIURIL) 25 MG tablet TAKE 1 TABLET BY MOUTH EVERY DAY  90 tablet  0   No current facility-administered medications on file prior to visit.   Allergies  Allergen Reactions  . Ace Inhibitors Cough  . Calcium-Containing Compounds Other (See Comments)    constpation  . Lactose Intolerance (Gi) Diarrhea   Past Medical History  Diagnosis Date  . Hypertension   . Hyperlipidemia   . Lung cancer, upper lobe 12/04/1996    RUL  . Osteoporosis   . Thigh shingles 12/04/1996  . S/P partial lobectomy of lung     [id number]  . Vitamin D deficiency       Review of Systems  HENT: Positive for postnasal drip.   All other systems  reviewed and are negative.   BP 124/80  Pulse 72  Temp(Src) 98 F (36.7 C) (Temporal)  Resp 18  Ht 5' 2.5" (1.588 m)  Wt 140 lb (63.504 kg)  BMI 25.18 kg/m2     Objective:   Physical Exam  Nursing note and vitals reviewed. Constitutional: She is oriented to person, place, and time. She appears well-developed and well-nourished. No distress.  HENT:  Head: Normocephalic and atraumatic.  Right Ear: External ear normal.  Left Ear: External ear normal.  Nose: Nose normal.  Mouth/Throat: Oropharynx is clear and moist. No oropharyngeal exudate.  Cloudy TM's bilaterally   Eyes: Conjunctivae and EOM are normal.  Neck: Normal range of motion. Neck supple. No JVD present. No thyromegaly present.  Cardiovascular: Normal rate, regular rhythm, normal heart sounds and intact distal pulses.   Pulmonary/Chest: Effort normal and breath sounds normal.  Abdominal: Soft. Bowel sounds are normal. She exhibits no distension and no mass. There is no tenderness. There is no rebound and no guarding.  Musculoskeletal: Normal range of motion. She exhibits no edema and no tenderness.  Lymphadenopathy:    She has no cervical adenopathy.  Neurological: She is alert and oriented to person,  place, and time. No cranial nerve deficit.  Skin: Skin is warm and dry. No rash noted. No erythema. No pallor.  Psychiatric: She has a normal mood and affect. Her behavior is normal. Judgment and thought content normal.          Assessment & Plan:  1.  3 month F/U for HTN, Cholesterol, Pre-Dm, D. Deficient. Needs healthy diet, cardio QD and obtain healthy weight. Check Labs, Check BP if >130/80 call office 2. Allergic rhinitis- Allegra OTC, increase H2o, allergy hygiene explained.

## 2014-01-10 LAB — INSULIN, FASTING: Insulin fasting, serum: 6 u[IU]/mL (ref 3–28)

## 2014-01-12 DIAGNOSIS — I10 Essential (primary) hypertension: Secondary | ICD-10-CM | POA: Insufficient documentation

## 2014-01-12 DIAGNOSIS — E782 Mixed hyperlipidemia: Secondary | ICD-10-CM | POA: Insufficient documentation

## 2014-01-12 DIAGNOSIS — E559 Vitamin D deficiency, unspecified: Secondary | ICD-10-CM | POA: Insufficient documentation

## 2014-04-08 ENCOUNTER — Other Ambulatory Visit: Payer: Self-pay | Admitting: Internal Medicine

## 2014-04-15 ENCOUNTER — Ambulatory Visit (HOSPITAL_COMMUNITY)
Admission: RE | Admit: 2014-04-15 | Discharge: 2014-04-15 | Disposition: A | Discharge status: Home / Self Care | Payer: Medicare Other | Source: Ambulatory Visit | Attending: Internal Medicine | Admitting: Internal Medicine

## 2014-04-15 ENCOUNTER — Encounter: Payer: Self-pay | Admitting: Internal Medicine

## 2014-04-15 ENCOUNTER — Ambulatory Visit (INDEPENDENT_AMBULATORY_CARE_PROVIDER_SITE_OTHER): Payer: Medicare Other | Admitting: Internal Medicine

## 2014-04-15 VITALS — BP 128/70 | HR 64 | Temp 98.4°F | Resp 16 | Ht 62.0 in | Wt 135.0 lb

## 2014-04-15 DIAGNOSIS — Z789 Other specified health status: Secondary | ICD-10-CM

## 2014-04-15 DIAGNOSIS — E559 Vitamin D deficiency, unspecified: Secondary | ICD-10-CM

## 2014-04-15 DIAGNOSIS — Z85118 Personal history of other malignant neoplasm of bronchus and lung: Secondary | ICD-10-CM | POA: Insufficient documentation

## 2014-04-15 DIAGNOSIS — R7309 Other abnormal glucose: Secondary | ICD-10-CM

## 2014-04-15 DIAGNOSIS — I10 Essential (primary) hypertension: Secondary | ICD-10-CM

## 2014-04-15 DIAGNOSIS — R7303 Prediabetes: Secondary | ICD-10-CM | POA: Insufficient documentation

## 2014-04-15 DIAGNOSIS — Z0000 Encounter for general adult medical examination without abnormal findings: Secondary | ICD-10-CM | POA: Insufficient documentation

## 2014-04-15 DIAGNOSIS — I77810 Thoracic aortic ectasia: Secondary | ICD-10-CM | POA: Insufficient documentation

## 2014-04-15 DIAGNOSIS — Z1212 Encounter for screening for malignant neoplasm of rectum: Secondary | ICD-10-CM

## 2014-04-15 DIAGNOSIS — Z1331 Encounter for screening for depression: Secondary | ICD-10-CM

## 2014-04-15 DIAGNOSIS — Z79899 Other long term (current) drug therapy: Secondary | ICD-10-CM

## 2014-04-15 DIAGNOSIS — Z0001 Encounter for general adult medical examination with abnormal findings: Secondary | ICD-10-CM | POA: Insufficient documentation

## 2014-04-15 DIAGNOSIS — E785 Hyperlipidemia, unspecified: Secondary | ICD-10-CM

## 2014-04-15 LAB — CBC WITH DIFFERENTIAL/PLATELET
Basophils Absolute: 0 10*3/uL (ref 0.0–0.1)
Basophils Relative: 0 % (ref 0–1)
EOS ABS: 0.1 10*3/uL (ref 0.0–0.7)
EOS PCT: 2 % (ref 0–5)
HCT: 40.2 % (ref 36.0–46.0)
Hemoglobin: 13.2 g/dL (ref 12.0–15.0)
LYMPHS ABS: 1.7 10*3/uL (ref 0.7–4.0)
LYMPHS PCT: 31 % (ref 12–46)
MCH: 28.1 pg (ref 26.0–34.0)
MCHC: 32.8 g/dL (ref 30.0–36.0)
MCV: 85.7 fL (ref 78.0–100.0)
Monocytes Absolute: 0.4 10*3/uL (ref 0.1–1.0)
Monocytes Relative: 7 % (ref 3–12)
Neutro Abs: 3.4 10*3/uL (ref 1.7–7.7)
Neutrophils Relative %: 60 % (ref 43–77)
Platelets: 195 10*3/uL (ref 150–400)
RBC: 4.69 MIL/uL (ref 3.87–5.11)
RDW: 14.5 % (ref 11.5–15.5)
WBC: 5.6 10*3/uL (ref 4.0–10.5)

## 2014-04-15 LAB — HEMOGLOBIN A1C
HEMOGLOBIN A1C: 6 % — AB (ref <5.7)
MEAN PLASMA GLUCOSE: 126 mg/dL — AB (ref <117)

## 2014-04-15 MED ORDER — AZELASTINE HCL 0.1 % NA SOLN: NASAL | Status: DC

## 2014-04-15 NOTE — Progress Notes (Signed)
Patient ID: Penny [name], female   DOB: 03-17-1933, 80 y.o.   MRN: [id number]   Annual Screening Comprehensive Examination  This very nice 80 y.o. WWF presents for complete physical.  Patient has been followed for Labile HTN, Prediabetes, Hyperlipidemia, and Vitamin D Deficiency.    HTN predates since  2000. Patient's BP has been controlled and today's BP: 128/70 mmHg. Patient denies any cardiac symptoms as chest pain, palpitations, shortness of breath, dizziness or ankle swelling.   Patient's hyperlipidemia is controlled with diet and medications. Patient denies myalgias or other medication SE's. Last lipid profile as below was at goal.  Lab Results  Component Value Date   CHOL 160 01/09/2014   HDL 53 01/09/2014   LDLCALC 91 01/09/2014   TRIG 82 01/09/2014   CHOLHDL 3.0 01/09/2014    Patient has prediabetes with A1c 5.9% in 2012 and last A1c was 5.7% in Aug 2014. Patient denies reactive hypoglycemic symptoms, visual blurring, diabetic polys, or paresthesias.    Finally, patient has history of Vitamin D Deficiency of 26 in 2008 and last vitamin D was 60 May 2014.  Medication Sig  . aspirin 81 MG chewable tablet Chew 81 mg by mouth daily.  . Biotin 10 MG TABS Take 10 mg by mouth.  . cholecalciferol (VITAMIN D) 1000 U Take 10,000 Units by mouth 4 (four) times a week.  . hydrochlorothiazide  25 MG  TAKE 1 TABLET BY MOUTH EVERY DAY  . loperamide (IMODIUM) 2 MG  Take 2 mg 2  times daily  . magnesium gluconate  500 MG tab Take 500 mg by mouth 1 day or 1 dose.  . metoprolol (LOPRESSOR) 50 MG tablet Take 50 mg by mouth 2 (two) times daily.  . simvastatin (ZOCOR) 40 MG tablet Take 40 mg by mouth every evening.   Allergies  Allergen Reactions  . Ace Inhibitors Cough  . Calcium-Containing Compounds Other (See Comments)    constpation  . Lactose Intolerance (Gi) Diarrhea   Past Medical History  Diagnosis Date  . Hypertension   . Hyperlipidemia   . Lung cancer, upper lobe 12/04/1996    RUL  .  Osteoporosis   . Thigh shingles 12/04/1996  . S/P partial lobectomy of lung     [id number]  . Vitamin D deficiency    Past Surgical History  Procedure Laterality Date  . Carpal tunnel release Right     2009  . Shoulder adhesion release Right     2004  . Lung removal, partial Right     RUL 1998    Family History  Problem Relation Age of Onset  . Heart disease Mother   . Lung cancer Father     History  Substance Use Topics  . Smoking status: Former Smoker    Quit date: 12/04/1996  . Smokeless tobacco: Never Used  . Alcohol Use: 0.6 oz/week    1 Glasses of wine per week    ROS Constitutional: Denies fever, chills, weight loss/gain, headaches, insomnia, fatigue, night sweats, and change in appetite. Eyes: Denies redness, blurred vision, diplopia, discharge, itchy, watery eyes.  ENT: Denies discharge, congestion, post nasal drip, epistaxis, sore throat, earache, hearing loss, dental pain, Tinnitus, Vertigo, Sinus pain, snoring.  Cardio: Denies chest pain, palpitations, irregular heartbeat, syncope, dyspnea, diaphoresis, orthopnea, PND, claudication, edema Respiratory: denies cough, dyspnea, DOE, pleurisy, hoarseness, laryngitis, wheezing.  Gastrointestinal: Denies dysphagia, heartburn, reflux, water brash, pain, cramps, nausea, vomiting, bloating, diarrhea, constipation, hematemesis, melena, hematochezia, jaundice, hemorrhoids Genitourinary: Denies dysuria, frequency, urgency,  nocturia, hesitancy, discharge, hematuria, flank pain Breast:Breast lumps, nipple discharge, bleeding.  Musculoskeletal: Denies arthralgia, myalgia, stiffness, Jt. Swelling, pain, limp, and strain/sprain. Skin: Denies puritis, rash, hives, warts, acne, eczema, changing in skin lesion Neuro: No weakness, tremor, incoordination, spasms, paresthesia, pain Psychiatric: Denies confusion, memory loss, sensory loss Endocrine: Denies change in weight, skin, hair change, nocturia, and paresthesia, diabetic polys,  visual blurring, hyper / hypo glycemic episodes.  Heme/Lymph: No excessive bleeding, bruising, enlarged lymph nodes.   Physical Exam  BP 128/70  Pulse 64  Temp(Src) 98.4 F (36.9 C) (Temporal)  Resp 16  Ht 5\' 2"  (1.575 m)  Wt 135 lb (61.236 kg)  BMI 24.69 kg/m2  General Appearance: Well nourished, in no apparent distress. Eyes: PERRLA, EOMs, conjunctiva no swelling or erythema, normal fundi and vessels. Sinuses: No frontal/maxillary tenderness ENT/Mouth: EACs patent / TMs  nl. Nares clear without erythema, swelling, mucoid exudates. Oral hygiene is good. No erythema, swelling, or exudate. Tongue normal, non-obstructing. Tonsils not swollen or erythematous. Hearing normal.  Neck: Supple, thyroid normal. No bruits, nodes or JVD. Respiratory: Respiratory effort normal.  BS equal and clear bilateral without rales, rhonci, wheezing or stridor. Cardio: Heart sounds are normal with regular rate and rhythm and no murmurs, rubs or gallops. Peripheral pulses are normal and equal bilaterally without edema. No aortic or femoral bruits. Chest: symmetric with normal excursions and percussion. Breasts: Symmetric, without lumps, nipple discharge, retractions, or fibrocystic changes.  Abdomen: Flat, soft, with bowl sounds. Nontender, no guarding, rebound, hernias, masses, or organomegaly.  Lymphatics: Non tender without lymphadenopathy.  Musculoskeletal: Full ROM all peripheral extremities, joint stability, 5/5 strength, and normal gait. Skin: Warm and dry without rashes, lesions, cyanosis, clubbing or  ecchymosis.  Neuro: Cranial nerves intact, reflexes equal bilaterally. Normal muscle tone, no cerebellar symptoms. Sensation intact.  Pysch: Awake and oriented X 3, normal affect, Insight and Judgment appropriate.   Assessment and Plan  1. Annual Screening Examination 2. Hypertension  3. Hyperlipidemia 4. Pre Diabetes 5. Vitamin D Deficiency  Continue prudent diet as discussed, weight control,  BP monitoring, regular exercise, and medications. Discussed med's effects and SE's. Screening labs and tests as requested with regular follow-up as recommended.

## 2014-04-15 NOTE — Patient Instructions (Signed)
 Hypertension As your heart beats, it forces blood through your arteries. This force is your blood pressure. If the pressure is too high, it is called hypertension (HTN) or high blood pressure. HTN is dangerous because you may have it and not know it. High blood pressure may mean that your heart has to work harder to pump blood. Your arteries may be narrow or stiff. The extra work puts you at risk for heart disease, stroke, and other problems.  Blood pressure consists of two numbers, a higher number over a lower, 110/72, for example. It is stated as "110 over 72." The ideal is below 120 for the top number (systolic) and under 80 for the bottom (diastolic). Write down your blood pressure today. You should pay close attention to your blood pressure if you have certain conditions such as:  Heart failure.  Prior heart attack.  Diabetes  Chronic kidney disease.  Prior stroke.  Multiple risk factors for heart disease. To see if you have HTN, your blood pressure should be measured while you are seated with your arm held at the level of the heart. It should be measured at least twice. A one-time elevated blood pressure reading (especially in the Emergency Department) does not mean that you need treatment. There may be conditions in which the blood pressure is different between your right and left arms. It is important to see your caregiver soon for a recheck. Most people have essential hypertension which means that there is not a specific cause. This type of high blood pressure may be lowered by changing lifestyle factors such as:  Stress.  Smoking.  Lack of exercise.  Excessive weight.  Drug/tobacco/alcohol use.  Eating less salt. Most people do not have symptoms from high blood pressure until it has caused damage to the body. Effective treatment can often prevent, delay or reduce that damage. TREATMENT  When a cause has been identified, treatment for high blood pressure is directed at  the cause. There are a large number of medications to treat HTN. These fall into several categories, and your caregiver will help you select the medicines that are best for you. Medications may have side effects. You should review side effects with your caregiver. If your blood pressure stays high after you have made lifestyle changes or started on medicines,   Your medication(s) may need to be changed.  Other problems may need to be addressed.  Be certain you understand your prescriptions, and know how and when to take your medicine.  Be sure to follow up with your caregiver within the time frame advised (usually within two weeks) to have your blood pressure rechecked and to review your medications.  If you are taking more than one medicine to lower your blood pressure, make sure you know how and at what times they should be taken. Taking two medicines at the same time can result in blood pressure that is too low. SEEK IMMEDIATE MEDICAL CARE IF:  You develop a severe headache, blurred or changing vision, or confusion.  You have unusual weakness or numbness, or a faint feeling.  You have severe chest or abdominal pain, vomiting, or breathing problems. MAKE SURE YOU:   Understand these instructions.  Will watch your condition.  Will get help right away if you are not doing well or get worse.   Diabetes and Exercise Exercising regularly is important. It is not just about losing weight. It has many health benefits, such as:  Improving your overall fitness, flexibility, and   endurance.  Increasing your bone density.  Helping with weight control.  Decreasing your body fat.  Increasing your muscle strength.  Reducing stress and tension.  Improving your overall health. People with diabetes who exercise gain additional benefits because exercise:  Reduces appetite.  Improves the body's use of blood sugar (glucose).  Helps lower or control blood glucose.  Decreases blood  pressure.  Helps control blood lipids (such as cholesterol and triglycerides).  Improves the body's use of the hormone insulin by:  Increasing the body's insulin sensitivity.  Reducing the body's insulin needs.  Decreases the risk for heart disease because exercising:  Lowers cholesterol and triglycerides levels.  Increases the levels of good cholesterol (such as high-density lipoproteins [HDL]) in the body.  Lowers blood glucose levels. YOUR ACTIVITY PLAN  Choose an activity that you enjoy and set realistic goals. Your health care provider or diabetes educator can help you make an activity plan that works for you. You can break activities into 2 or 3 sessions throughout the day. Doing so is as good as one long session. Exercise ideas include:  Taking the dog for a walk.  Taking the stairs instead of the elevator.  Dancing to your favorite song.  Doing your favorite exercise with a friend. RECOMMENDATIONS FOR EXERCISING WITH TYPE 1 OR TYPE 2 DIABETES   Check your blood glucose before exercising. If blood glucose levels are greater than 240 mg/dL, check for urine ketones. Do not exercise if ketones are present.  Avoid injecting insulin into areas of the body that are going to be exercised. For example, avoid injecting insulin into:  The arms when playing tennis.  The legs when jogging.  Keep a record of:  Food intake before and after you exercise.  Expected peak times of insulin action.  Blood glucose levels before and after you exercise.  The type and amount of exercise you have done.  Review your records with your health care provider. Your health care provider will help you to develop guidelines for adjusting food intake and insulin amounts before and after exercising.  If you take insulin or oral hypoglycemic agents, watch for signs and symptoms of hypoglycemia. They include:  Dizziness.  Shaking.  Sweating.  Chills.  Confusion.  Drink plenty of water  while you exercise to prevent dehydration or heat stroke. Body water is lost during exercise and must be replaced.  Talk to your health care provider before starting an exercise program to make sure it is safe for you. Remember, almost any type of activity is better than none.    Cholesterol Cholesterol is a white, waxy, fat-like protein needed by your body in Hogan amounts. The liver makes all the cholesterol you need. It is carried from the liver by the blood through the blood vessels. Deposits (plaque) may build up on blood vessel walls. This makes the arteries narrower and stiffer. Plaque increases the risk for heart attack and stroke. You cannot feel your cholesterol level even if it is very high. The only way to know is by a blood test to check your lipid (fats) levels. Once you know your cholesterol levels, you should keep a record of the test results. Work with your caregiver to to keep your levels in the desired range. WHAT THE RESULTS MEAN:  Total cholesterol is a rough measure of all the cholesterol in your blood.  LDL is the so-called bad cholesterol. This is the type that deposits cholesterol in the walls of the arteries. You want this   level to be low.  HDL is the good cholesterol because it cleans the arteries and carries the LDL away. You want this level to be high.  Triglycerides are fat that the body can either burn for energy or store. High levels are closely linked to heart disease. DESIRED LEVELS:  Total cholesterol below 200.  LDL below 100 for people at risk, below 70 for very high risk.  HDL above 50 is good, above 60 is best.  Triglycerides below 150. HOW TO LOWER YOUR CHOLESTEROL:  Diet.  Choose fish or white meat chicken and turkey, roasted or baked. Limit fatty cuts of red meat, fried foods, and processed meats, such as sausage and lunch meat.  Eat lots of fresh fruits and vegetables. Choose whole grains, beans, pasta, potatoes and cereals.  Use only  K[place]rer amounts of olive, corn or canola oils. Avoid butter, mayonnaise, shortening or palm kernel oils. Avoid foods with trans-fats.  Use skim/nonfat milk and low-fat/nonfat yogurt and cheeses. Avoid whole milk, cream, ice cream, egg yolks and cheeses. Healthy desserts include angel food cake, ginger snaps, animal crackers, hard candy, popsicles, and low-fat/nonfat frozen yogurt. Avoid pastries, cakes, pies and cookies.  Exercise.  A regular program helps decrease LDL and raises HDL.  Helps with weight control.  Do things that increase your activity level like gardening, walking, or taking the stairs.  Medication.  May be prescribed by your caregiver to help lowering cholesterol and the risk for heart disease.  You may need medicine even if your levels are normal if you have several risk factors. HOME CARE INSTRUCTIONS   Follow your diet and exercise programs as suggested by your caregiver.  Take medications as directed.  Have blood work done when your caregiver feels it is necessary. MAKE SURE YOU:   Understand these instructions.  Will watch your condition.  Will get help right away if you are not doing well or get worse.      Vitamin D Deficiency Vitamin D is an important vitamin that your body needs. Having too little of it in your body is called a deficiency. A very bad deficiency can make your bones soft and can cause a condition called rickets.  Vitamin D is important to your body for different reasons, such as:   It helps your body absorb 2 minerals called calcium and phosphorus.  It helps make your bones healthy.  It may prevent some diseases, such as diabetes and multiple sclerosis.  It helps your muscles and heart. You can get vitamin D in several ways. It is a natural part of some foods. The vitamin is also added to some dairy products and cereals. Some people take vitamin D supplements. Also, your body makes vitamin D when you are in the sun. It changes the  sun's rays into a form of the vitamin that your body can use. CAUSES   Not eating enough foods that contain vitamin D.  Not getting enough sunlight.  Having certain digestive system diseases that make it hard to absorb vitamin D. These diseases include Crohn's disease, chronic pancreatitis, and cystic fibrosis.  Having a surgery in which part of the stomach or Arntson intestine is removed.  Being obese. Fat cells pull vitamin D out of your blood. That means that obese people may not have enough vitamin D left in their blood and in other body tissues.  Having chronic kidney or liver disease. RISK FACTORS Risk factors are things that make you more likely to develop a vitamin   D deficiency. They include:  Being older.  Not being able to get outside very much.  Living in a nursing home.  Having had broken bones.  Having weak or thin bones (osteoporosis).  Having a disease or condition that changes how your body absorbs vitamin D.  Having dark skin.  Some medicines such as seizure medicines or steroids.  Being overweight or obese. SYMPTOMS Mild cases of vitamin D deficiency may not have any symptoms. If you have a very bad case, symptoms may include:  Bone pain.  Muscle pain.  Falling often.  Broken bones caused by a minor injury, due to osteoporosis. DIAGNOSIS A blood test is the best way to tell if you have a vitamin D deficiency. TREATMENT Vitamin D deficiency can be treated in different ways. Treatment for vitamin D deficiency depends on what is causing it. Options include:  Taking vitamin D supplements.  Taking a calcium supplement. Your caregiver will suggest what dose is best for you. HOME CARE INSTRUCTIONS  Take any supplements that your caregiver prescribes. Follow the directions carefully. Take only the suggested amount.  Have your blood tested 2 months after you start taking supplements.  Eat foods that contain vitamin D. Healthy choices  include:  Fortified dairy products, cereals, or juices. Fortified means vitamin D has been added to the food. Check the label on the package to be sure.  Fatty fish like salmon or trout.  Eggs.  Oysters.  Do not use a tanning bed.  Keep your weight at a healthy level. Lose weight if you need to.  Keep all follow-up appointments. Your caregiver will need to perform blood tests to make sure your vitamin D deficiency is going away. SEEK MEDICAL CARE IF:  You have any questions about your treatment.  You continue to have symptoms of vitamin D deficiency.  You have nausea or vomiting.  You are constipated.  You feel confused.  You have severe abdominal or back pain. MAKE SURE YOU:  Understand these instructions.  Will watch your condition.  Will get help right away if you are not doing well or get worse.   

## 2014-04-16 LAB — TSH: TSH: 1.708 u[IU]/mL (ref 0.350–4.500)

## 2014-04-16 LAB — BASIC METABOLIC PANEL WITH GFR
BUN: 23 mg/dL (ref 6–23)
CHLORIDE: 102 meq/L (ref 96–112)
CO2: 31 meq/L (ref 19–32)
Calcium: 9.9 mg/dL (ref 8.4–10.5)
Creat: 0.7 mg/dL (ref 0.50–1.10)
GFR, Est African American: >89 mL/min
GFR, Est Non African American: 83 mL/min
Glucose, Bld: 108 mg/dL — ABNORMAL HIGH (ref 70–99)
Potassium: 3.8 mEq/L (ref 3.5–5.3)
Sodium: 143 mEq/L (ref 135–145)

## 2014-04-16 LAB — LIPID PANEL
Cholesterol: 173 mg/dL (ref 0–200)
HDL: 68 mg/dL (ref >39)
LDL CALC: 86 mg/dL (ref 0–99)
Total CHOL/HDL Ratio: 2.5 Ratio
Triglycerides: 93 mg/dL (ref <150)
VLDL: 19 mg/dL (ref 0–40)

## 2014-04-16 LAB — URINALYSIS, MICROSCOPIC ONLY
BACTERIA UA: NONE SEEN
CASTS: NONE SEEN
CRYSTALS: NONE SEEN
Squamous Epithelial / LPF: NONE SEEN

## 2014-04-16 LAB — INSULIN, FASTING: Insulin fasting, serum: 4 u[IU]/mL (ref 3–28)

## 2014-04-16 LAB — MAGNESIUM: Magnesium: 1.8 mg/dL (ref 1.5–2.5)

## 2014-04-16 LAB — HEPATIC FUNCTION PANEL
ALBUMIN: 4.5 g/dL (ref 3.5–5.2)
ALK PHOS: 67 U/L (ref 39–117)
ALT: 16 U/L (ref 0–35)
AST: 27 U/L (ref 0–37)
Bilirubin, Direct: 0.1 mg/dL (ref 0.0–0.3)
Indirect Bilirubin: 0.5 mg/dL (ref 0.2–1.2)
Total Bilirubin: 0.6 mg/dL (ref 0.2–1.2)
Total Protein: 7.4 g/dL (ref 6.0–8.3)

## 2014-04-16 LAB — MICROALBUMIN / CREATININE URINE RATIO
Creatinine, Urine: 20.2 mg/dL
MICROALB UR: 0.5 mg/dL (ref 0.00–1.89)
Microalb Creat Ratio: 24.8 mg/g (ref 0.0–30.0)

## 2014-04-16 LAB — VITAMIN D 25 HYDROXY (VIT D DEFICIENCY, FRACTURES): VIT D 25 HYDROXY: 96 ng/mL — AB (ref 30–89)

## 2014-04-20 ENCOUNTER — Telehealth: Payer: Self-pay

## 2014-04-20 NOTE — Telephone Encounter (Signed)
Message copied by Nadyne Coombes on Mon Apr 20, 2014  4:56 PM ------      Message from: Unk Pinto      Created: Sun Apr 19, 2014  6:01 PM       CBC U/A Kidneys Liver Thyroid Mag - All Nl Ok      Chol 173 - HDL 68 - LDL 86 - all terrific      Vit D 96 great (goal is 70-100_      A1c 6.0% - sl high - can try cinnamon 1000 mg tab 2 x da       Can get at [email or web address].es or any vitamin store - drug store etc ------

## 2014-04-20 NOTE — Telephone Encounter (Signed)
lmom to pt return my call.

## 2014-05-06 ENCOUNTER — Other Ambulatory Visit: Payer: Self-pay | Admitting: Internal Medicine

## 2014-07-21 ENCOUNTER — Ambulatory Visit (INDEPENDENT_AMBULATORY_CARE_PROVIDER_SITE_OTHER): Payer: Medicare Other | Admitting: Physician Assistant

## 2014-07-21 ENCOUNTER — Encounter: Payer: Self-pay | Admitting: Physician Assistant

## 2014-07-21 VITALS — BP 120/78 | HR 72 | Temp 98.6°F | Resp 16 | Wt 138.0 lb

## 2014-07-21 DIAGNOSIS — I10 Essential (primary) hypertension: Secondary | ICD-10-CM

## 2014-07-21 DIAGNOSIS — Z1331 Encounter for screening for depression: Secondary | ICD-10-CM

## 2014-07-21 DIAGNOSIS — E559 Vitamin D deficiency, unspecified: Secondary | ICD-10-CM

## 2014-07-21 DIAGNOSIS — Z79899 Other long term (current) drug therapy: Secondary | ICD-10-CM

## 2014-07-21 DIAGNOSIS — R7309 Other abnormal glucose: Secondary | ICD-10-CM

## 2014-07-21 DIAGNOSIS — Z23 Encounter for immunization: Secondary | ICD-10-CM

## 2014-07-21 DIAGNOSIS — Z789 Other specified health status: Secondary | ICD-10-CM

## 2014-07-21 DIAGNOSIS — E785 Hyperlipidemia, unspecified: Secondary | ICD-10-CM

## 2014-07-21 DIAGNOSIS — Z0000 Encounter for general adult medical examination without abnormal findings: Secondary | ICD-10-CM

## 2014-07-21 LAB — CBC WITH DIFFERENTIAL/PLATELET
BASOS ABS: 0 10*3/uL (ref 0.0–0.1)
Basophils Relative: 0 % (ref 0–1)
EOS PCT: 2 % (ref 0–5)
Eosinophils Absolute: 0.1 10*3/uL (ref 0.0–0.7)
HEMATOCRIT: 37.3 % (ref 36.0–46.0)
Hemoglobin: 12.2 g/dL (ref 12.0–15.0)
LYMPHS ABS: 1.7 10*3/uL (ref 0.7–4.0)
LYMPHS PCT: 33 % (ref 12–46)
MCH: 27.5 pg (ref 26.0–34.0)
MCHC: 32.7 g/dL (ref 30.0–36.0)
MCV: 84 fL (ref 78.0–100.0)
Monocytes Absolute: 0.6 10*3/uL (ref 0.1–1.0)
Monocytes Relative: 11 % (ref 3–12)
NEUTROS ABS: 2.8 10*3/uL (ref 1.7–7.7)
Neutrophils Relative %: 54 % (ref 43–77)
Platelets: 198 10*3/uL (ref 150–400)
RBC: 4.44 MIL/uL (ref 3.87–5.11)
RDW: 14.6 % (ref 11.5–15.5)
WBC: 5.2 10*3/uL (ref 4.0–10.5)

## 2014-07-21 MED ORDER — ALPRAZOLAM 0.5 MG PO TABS: 0.5000 mg | ORAL_TABLET | Freq: Three times a day (TID) | ORAL | Status: DC | PRN

## 2014-07-21 MED ORDER — ZOLPIDEM TARTRATE 5 MG PO TABS: 5.0000 mg | ORAL_TABLET | Freq: Every evening | ORAL | Status: DC | PRN

## 2014-07-21 NOTE — Patient Instructions (Addendum)
[employer] with no obligation # [phone] Tues-Sat 10-6  Preventative Care for Adults - Female      MAINTAIN REGULAR HEALTH EXAMS:  A routine yearly physical is a good way to check in with your primary care provider about your health and preventive screening. It is also an opportunity to share updates about your health and any concerns you have, and receive a thorough all-over exam.   Most health insurance companies pay for at least some preventative services.  Check with your health plan for specific coverages.  WHAT PREVENTATIVE SERVICES DO WOMEN NEED?  Adult women should have their weight and blood pressure checked regularly.   Women age 66 and older should have their cholesterol levels checked regularly.  Women should be screened for cervical cancer with a Pap smear and pelvic exam beginning at either age 72, or 3 years after they become sexually activity.    Breast cancer screening generally begins at age 35 with a mammogram and breast exam by your primary care provider.    Beginning at age 42 and continuing to age 53, women should be screened for colorectal cancer.  Certain people may need continued testing until age 71.  Updating vaccinations is part of preventative care.  Vaccinations help protect against diseases such as the flu.  Osteoporosis is a disease in which the bones lose minerals and strength as we age. Women ages 42 and over should discuss this with their caregivers, as should women after menopause who have other risk factors.  Lab tests are generally done as part of preventative care to screen for anemia and blood disorders, to screen for problems with the kidneys and liver, to screen for bladder problems, to check blood sugar, and to check your cholesterol level.  Preventative services generally include counseling about diet, exercise, avoiding tobacco, drugs, excessive alcohol consumption, and sexually transmitted infections.    GENERAL  RECOMMENDATIONS FOR GOOD HEALTH:  Healthy diet:  Eat a variety of foods, including fruit, vegetables, animal or vegetable protein, such as meat, fish, chicken, and eggs, or beans, lentils, tofu, and grains, such as rice.  Drink plenty of water daily.  Decrease saturated fat in the diet, avoid lots of red meat, processed foods, sweets, fast foods, and fried foods.  Exercise:  Aerobic exercise helps maintain good heart health. At least 30-40 minutes of moderate-intensity exercise is recommended. For example, a brisk walk that increases your heart rate and breathing. This should be done on most days of the week.   Find a type of exercise or a variety of exercises that you enjoy so that it becomes a part of your daily life.  Examples are running, walking, swimming, water aerobics, and biking.  For motivation and support, explore group exercise such as aerobic class, spin class, Zumba, Yoga,or  martial arts, etc.    Set exercise goals for yourself, such as a certain weight goal, walk or run in a race such as a 5k walk/run.  Speak to your primary care provider about exercise goals.  Disease prevention:  If you smoke or chew tobacco, find out from your caregiver how to quit. It can literally save your life, no matter how long you have been a tobacco user. If you do not use tobacco, never begin.   Maintain a healthy diet and normal weight. Increased weight leads to problems with blood pressure and diabetes.   The Body Mass Index or BMI is a way of measuring how much of your  body is fat. Having a BMI above 27 increases the risk of heart disease, diabetes, hypertension, stroke and other problems related to obesity. Your caregiver can help determine your BMI and based on it develop an exercise and dietary program to help you achieve or maintain this important measurement at a healthful level.  High blood pressure causes heart and blood vessel problems.  Persistent high blood pressure should be treated  with medicine if weight loss and exercise do not work.   Fat and cholesterol leaves deposits in your arteries that can block them. This causes heart disease and vessel disease elsewhere in your body.  If your cholesterol is found to be high, or if you have heart disease or certain other medical conditions, then you may need to have your cholesterol monitored frequently and be treated with medication.   Ask if you should have a cardiac stress test if your history suggests this. A stress test is a test done on a treadmill that looks for heart disease. This test can find disease prior to there being a problem.  Menopause can be associated with physical symptoms and risks. Hormone replacement therapy is available to decrease these. You should talk to your caregiver about whether starting or continuing to take hormones is right for you.   Osteoporosis is a disease in which the bones lose minerals and strength as we age. This can result in serious bone fractures. Risk of osteoporosis can be identified using a bone density scan. Women ages 51 and over should discuss this with their caregivers, as should women after menopause who have other risk factors. Ask your caregiver whether you should be taking a calcium supplement and Vitamin D, to reduce the rate of osteoporosis.   Avoid drinking alcohol in excess (more than two drinks per day).  Avoid use of street drugs. Do not share needles with anyone. Ask for professional help if you need assistance or instructions on stopping the use of alcohol, cigarettes, and/or drugs.  Brush your teeth twice a day with fluoride toothpaste, and floss once a day. Good oral hygiene prevents tooth decay and gum disease. The problems can be painful, unattractive, and can cause other health problems. Visit your dentist for a routine oral and dental check up and preventive care every 6-12 months.   Look at your skin regularly.  Use a mirror to look at your back. Notify your  caregivers of changes in moles, especially if there are changes in shapes, colors, a size larger than a pencil eraser, an irregular border, or development of new moles.  Safety:  Use seatbelts 100% of the time, whether driving or as a passenger.  Use safety devices such as hearing protection if you work in environments with loud noise or significant background noise.  Use safety glasses when doing any work that could send debris in to the eyes.  Use a helmet if you ride a bike or motorcycle.  Use appropriate safety gear for contact sports.  Talk to your caregiver about gun safety.  Use sunscreen with a SPF (or skin protection factor) of 15 or greater.  Lighter skinned people are at a greater risk of skin cancer. Don't forget to also wear sunglasses in order to protect your eyes from too much damaging sunlight. Damaging sunlight can accelerate cataract formation.   Practice safe sex. Use condoms. Condoms are used for birth control and to help reduce the spread of sexually transmitted infections (or STIs).  Some of the STIs are gonorrhea (the  clap), chlamydia, syphilis, trichomonas, herpes, HPV (human papilloma virus) and HIV (human immunodeficiency virus) which causes AIDS. The herpes, HIV and HPV are viral illnesses that have no cure. These can result in disability, cancer and death.   Keep carbon monoxide and smoke detectors in your home functioning at all times. Change the batteries every 6 months or use a model that plugs into the wall.   Vaccinations:  Stay up to date with your tetanus shots and other required immunizations. You should have a booster for tetanus every 10 years. Be sure to get your flu shot every year, since 5%-20% of the [place] population comes down with the flu. The flu vaccine changes each year, so being vaccinated once is not enough. Get your shot in the fall, before the flu season peaks.   Other vaccines to consider:  Human Papilloma Virus or HPV causes cancer of the cervix,  and other infections that can be transmitted from person to person. There is a vaccine for HPV, and females should get immunized between the ages of 25 and 17. It requires a series of 3 shots.   Pneumococcal vaccine to protect against certain types of pneumonia.  This is normally recommended for adults age 69 or older.  However, adults younger than 78 years old with certain underlying conditions such as diabetes, heart or lung disease should also receive the vaccine.  Shingles vaccine to protect against Varicella Zoster if you are older than age 68, or younger than 78 years old with certain underlying illness.  Hepatitis A vaccine to protect against a form of infection of the liver by a virus acquired from food.  Hepatitis B vaccine to protect against a form of infection of the liver by a virus acquired from blood or body fluids, particularly if you work in health care.  If you plan to travel internationally, check with your local health department for specific vaccination recommendations.  Cancer Screening:  Breast cancer screening is essential to preventive care for women. All women age 18 and older should perform a breast self-exam every month. At age 42 and older, women should have their caregiver complete a breast exam each year. Women at ages 13 and older should have a mammogram (x-ray film) of the breasts. Your caregiver can discuss how often you need mammograms.    Cervical cancer screening includes taking a Pap smear (sample of cells examined under a microscope) from the cervix (end of the uterus). It also includes testing for HPV (Human Papilloma Virus, which can cause cervical cancer). Screening and a pelvic exam should begin at age 43, or 3 years after a woman becomes sexually active. Screening should occur every year, with a Pap smear but no HPV testing, up to age 57. After age 34, you should have a Pap smear every 3 years with HPV testing, if no HPV was found previously.   Most  routine colon cancer screening begins at the age of 39. On a yearly basis, doctors may provide special easy to use take-home tests to check for hidden blood in the stool. Sigmoidoscopy or colonoscopy can detect the earliest forms of colon cancer and is life saving. These tests use a Diamond camera at the end of a tube to directly examine the colon. Speak to your caregiver about this at age 21, when routine screening begins (and is repeated every 5 years unless early forms of pre-cancerous polyps or Jaimes growths are found).

## 2014-07-21 NOTE — Progress Notes (Signed)
MEDICARE ANNUAL WELLNESS VISIT AND FOLLOW UP  Assessment:   1. Essential hypertension - CBC with Differential - BASIC METABOLIC PANEL WITH GFR - Hepatic function panel - TSH  2. Encounter for long-term (current) use of other medications - Magnesium  3. Hyperlipidemia - Lipid panel  4. PreDiabetes Discussed general issues about diabetes pathophysiology and management., Educational material distributed., Suggested low cholesterol diet., Encouraged aerobic exercise., Discussed foot care., Reminded to get yearly retinal exam. - Hemoglobin A1c - Insulin, fasting - HM DIABETES FOOT EXAM  5. Vitamin D deficiency - Vit D  25 hydroxy (rtn osteoporosis monitoring)  6. History of Lung cancer Continue follow ups, last CXR 04/2014   Plan:   During the course of the visit the patient was educated and counseled about appropriate screening and preventive services including:    Pneumococcal vaccine   Influenza vaccine  Td vaccine  Screening electrocardiogram  Screening mammography  Bone densitometry screening  Colorectal cancer screening  Diabetes screening  Glaucoma screening  Nutrition counseling   Advanced directives: given info/requested  Screening recommendations, referrals:  Vaccinations: Tdap vaccine not indicated Influenza vaccine requested Pneumococcal vaccine requested Shingles vaccine declined Hep B vaccine not indicated  Nutrition assessed and recommended  Colonoscopy due 2017 Mammogram due 11/2014 Pap smear not indicated Pelvic exam not indicated Recommended yearly ophthalmology/optometry visit for glaucoma screening and checkup Recommended yearly dental visit for hygiene and checkup Advanced directives - requested  Conditions/risks identified: BMI: Discussed weight loss, diet, and increase physical activity.  Increase physical activity: AHA recommends 150 minutes of physical activity a week.  Medications reviewed DEXA- due 2016 Diabetes is  at goal Urinary Incontinence is not an issue: discussed non pharmacology and pharmacology options.  Fall risk: low- discussed PT, home fall assessment, medications.    Subjective:   Penny [name] is a 80 y.o. female who presents for Medicare Annual Wellness Visit and 3 month follow up on hypertension, prediabetes, hyperlipidemia, vitamin D def.  Date of last medicare wellness visit is unknown.   Her blood pressure has been controlled at home, today their BP is BP: 120/78 mmHg She does not workout but is very active. She denies chest pain, shortness of breath, dizziness.  She is on cholesterol medication and denies myalgias. Her cholesterol is at goal. The cholesterol last visit was:   Lab Results  Component Value Date   CHOL 173 04/15/2014   HDL 68 04/15/2014   LDLCALC 86 04/15/2014   TRIG 93 04/15/2014   CHOLHDL 2.5 04/15/2014   She has been working on diet and exercise for prediabetes, and denies paresthesia of the feet, polydipsia and polyuria. Last A1C in the office was:  Lab Results  Component Value Date   HGBA1C 6.0* 04/15/2014   Patient is on Vitamin D supplement. Lab Results  Component Value Date   VD25OH 96* 04/15/2014     She has been having some intermittent back pain for years, this past weekend she was picking up something and felt lower back pain, she was using biofreeze but she can not get this anymore and she is using topical. She has some left lateral leg pain, denies incontinence or saddle anesthesia.   Names of Other Physician/Practitioners you currently use: 1. [facility]- here for primary care 2. Dr. Santa Lighter , eye doctor, last visit 01/2014 3. Dr. Gilford Rile, dentist, last visit 12/2013 Patient Care Team: Unk Pinto, MD as PCP - General (Internal Medicine) Simona Huh, MD as Consulting Physician (Dermatology)  Daryll Brod, MD as Consulting Physician (Orthopedic Surgery) Mayme Genta, MD as Consulting  Physician (Gastroenterology) Nicanor Alcon, MD as Attending Physician (Thoracic Surgery)  Medication Review Current Outpatient Prescriptions on File Prior to Visit  Medication Sig Dispense Refill  . aspirin 81 MG chewable tablet Chew 81 mg by mouth daily.      Marland Kitchen azelastine (ASTELIN) 0.1 % nasal spray Use i1 to 2 sprays each nostril once or twice day  30 mL  99  . Biotin 10 MG TABS Take 10 mg by mouth.      . cholecalciferol (VITAMIN D) 1000 UNITS tablet Take 10,000 Units by mouth 4 (four) times a week.      . hydrochlorothiazide (HYDRODIURIL) 25 MG tablet TAKE 1 TABLET BY MOUTH EVERY DAY  90 tablet  3  . loperamide (IMODIUM) 2 MG capsule Take 2 mg by mouth 2 (two) times daily. [employer] brand anti-diarrhea      . magnesium gluconate (MAGONATE) 500 MG tablet Take 500 mg by mouth 1 day or 1 dose.      . metoprolol (LOPRESSOR) 50 MG tablet TAKE 1 TABLET BY MOUTH TWICE DAILY  180 tablet  1  . simvastatin (ZOCOR) 40 MG tablet TAKE ONE TABLET BY MOUTH EVERY NIGHT AT BEDTIME FOR CHOLESTEROL  90 tablet  0   No current facility-administered medications on file prior to visit.    Current Problems (verified) Patient Active Problem List   Diagnosis Date Noted  . PreDiabetes 04/15/2014  . Encounter for long-term (current) use of other medications 04/15/2014  . Hypertension   . Hyperlipidemia   . Vitamin D deficiency   . Lung cancer, upper lobe 12/04/1996    Screening Tests Health Maintenance  Topic Date Due  . Zostavax  06/11/1995  . Pneumococcal Polysaccharide Vaccine Age 55 And Over  06/10/2000  . Influenza Vaccine  07/04/2014  . Tetanus/tdap  04/04/2023  . Colonoscopy  09/30/2023     Immunization History  Administered Date(s) Administered  . Influenza-Unspecified 08/04/2012, 09/03/2013  . Pneumococcal Conjugate-13 12/04/2004  . Td 04/03/2013   Preventative care: Last colonoscopy: 2012 due 2017 Last mammogram: 11/2013 Last pap smear/pelvic exam: 2012 negative DEXA: 2014 due  2016 Echo 2009 normal EF CXR 04/2014  Prior vaccinations: TD or Tdap: 2014  Influenza: 2014 gets from [place]  Pneumococcal: 2006 Prevnar 13: DUE Shingles/Zostavax: declines due to cost  History reviewed: allergies, current medications, past family history, past medical history, past social history, past surgical history and problem list  Risk Factors: Osteoporosis: postmenopausal estrogen deficiency and dietary calcium and/or vitamin D deficiency History of fracture in the past year: no  Tobacco History  Substance Use Topics  . Smoking status: Former Smoker    Quit date: 12/04/1996  . Smokeless tobacco: Never Used  . Alcohol Use: 0.6 oz/week    1 Glasses of wine per week   She does not smoke.  Patient is a former smoker. Are there smokers in your home (other than you)?  No  Alcohol Current alcohol use: glass of wine with dinner  Caffeine Current caffeine use: coffee 2 /day  Exercise Current exercise: walking  Nutrition/Diet Current diet: in general, a "healthy" diet    Cardiac risk factors: advanced age (older than 52 for men, 37 for women), dyslipidemia, hypertension and sedentary lifestyle.  Depression Screen (Note: if answer to either of the following is "Yes", a more complete depression screening is indicated)   Q1: Over the past two weeks, have you felt down,  depressed or hopeless? No  Q2: Over the past two weeks, have you felt little interest or pleasure in doing things? No  Have you lost interest or pleasure in daily life? No  Do you often feel hopeless? No  Do you cry easily over simple problems? No  Activities of Daily Living In your present state of health, do you have any difficulty performing the following activities?:  Driving? No Managing money?  No Feeding yourself? No Getting from bed to chair? No Climbing a flight of stairs? No Preparing food and eating?: No Bathing or showering? No Getting dressed: No Getting to the toilet? No Using  the toilet:No Moving around from place to place: No In the past year have you fallen or had a near fall?:No   Are you sexually active?  No  Do you have more than one partner?  No  Vision Difficulties: No  Hearing Difficulties: Yes Do you often ask people to speak up or repeat themselves? Yes Do you experience ringing or noises in your ears? No Do you have difficulty understanding soft or whispered voices? Yes  Cognition  Do you feel that you have a problem with memory?No  Do you often misplace items? No  Do you feel safe at home?  Yes  Advanced directives Does patient have a [place]? Yes Does patient have a Living Will? Yes   Objective:   Blood pressure 120/78, pulse 72, temperature 98.6 F (37 C), resp. rate 16, weight 138 lb (62.596 kg). Body mass index is 25.23 kg/(m^2).  General appearance: alert, no distress, WD/WN,  female Cognitive Testing  Alert? Yes  Normal Appearance?Yes  Oriented to person? No  Place? Yes   Time? Yes  Recall of three objects?  Yes  Can perform simple calculations? Yes  Displays appropriate judgment?Yes  Can read the correct time from a watch face?Yes  HEENT: normocephalic, sclerae anicteric, TMs pearly, nares patent, no discharge or erythema, pharynx normal Oral cavity: MMM, no lesions Neck: supple, no lymphadenopathy, no thyromegaly, no masses Heart: RRR, normal S1, S2, no murmurs Lungs: CTA bilaterally, no wheezes, rhonchi, or rales Abdomen: +bs, soft, non tender, non distended, no masses, no hepatomegaly, no splenomegaly Musculoskeletal: nontender, no swelling, no obvious deformity Extremities: no edema, no cyanosis, no clubbing Pulses: 2+ symmetric, upper and lower extremities, normal cap refill Neurological: alert, oriented x 3, CN2-12 intact, strength normal upper extremities and lower extremities, sensation normal throughout, DTRs 2+ throughout, no cerebellar signs, gait normal Psychiatric: normal affect,  behavior normal, pleasant  Breast: defer Gyn: defer Rectal: defer  Medicare Attestation I have personally reviewed: The patient's medical and social history Their use of alcohol, tobacco or illicit drugs Their current medications and supplements The patient's functional ability including ADLs,fall risks, home safety risks, cognitive, and hearing and visual impairment Diet and physical activities Evidence for depression or mood disorders  The patient's weight, height, BMI, and visual acuity have been recorded in the chart.  I have made referrals, counseling, and provided education to the patient based on review of the above and I have provided the patient with a written personalized care plan for preventive services.     Vicie Mutters, PA-C   07/21/2014

## 2014-07-22 LAB — HEPATIC FUNCTION PANEL
ALBUMIN: 4.1 g/dL (ref 3.5–5.2)
ALK PHOS: 70 U/L (ref 39–117)
ALT: 14 U/L (ref 0–35)
AST: 20 U/L (ref 0–37)
BILIRUBIN INDIRECT: 0.2 mg/dL (ref 0.2–1.2)
Bilirubin, Direct: 0.1 mg/dL (ref 0.0–0.3)
Total Bilirubin: 0.3 mg/dL (ref 0.2–1.2)
Total Protein: 6.8 g/dL (ref 6.0–8.3)

## 2014-07-22 LAB — BASIC METABOLIC PANEL WITH GFR
BUN: 20 mg/dL (ref 6–23)
CHLORIDE: 102 meq/L (ref 96–112)
CO2: 32 meq/L (ref 19–32)
CREATININE: 0.67 mg/dL (ref 0.50–1.10)
Calcium: 9.6 mg/dL (ref 8.4–10.5)
GFR, EST NON AFRICAN AMERICAN: 84 mL/min
GFR, Est African American: >89 mL/min
Glucose, Bld: 117 mg/dL — ABNORMAL HIGH (ref 70–99)
POTASSIUM: 3.9 meq/L (ref 3.5–5.3)
Sodium: 142 mEq/L (ref 135–145)

## 2014-07-22 LAB — LIPID PANEL
CHOL/HDL RATIO: 2.2 ratio
Cholesterol: 141 mg/dL (ref 0–200)
HDL: 63 mg/dL (ref >39)
LDL CALC: 62 mg/dL (ref 0–99)
Triglycerides: 81 mg/dL (ref <150)
VLDL: 16 mg/dL (ref 0–40)

## 2014-07-22 LAB — MAGNESIUM: Magnesium: 1.7 mg/dL (ref 1.5–2.5)

## 2014-07-22 LAB — VITAMIN D 25 HYDROXY (VIT D DEFICIENCY, FRACTURES): Vit D, 25-Hydroxy: 96 ng/mL — ABNORMAL HIGH (ref 30–89)

## 2014-07-22 LAB — INSULIN, FASTING: Insulin fasting, serum: 21 u[IU]/mL (ref 3–28)

## 2014-07-22 LAB — HEMOGLOBIN A1C
HEMOGLOBIN A1C: 6.1 % — AB (ref <5.7)
Mean Plasma Glucose: 128 mg/dL — ABNORMAL HIGH (ref <117)

## 2014-07-22 LAB — TSH: TSH: 0.964 u[IU]/mL (ref 0.350–4.500)

## 2014-08-05 ENCOUNTER — Other Ambulatory Visit: Payer: Self-pay | Admitting: Emergency Medicine

## 2014-10-16 ENCOUNTER — Other Ambulatory Visit: Payer: Self-pay

## 2014-10-16 DIAGNOSIS — Z1231 Encounter for screening mammogram for malignant neoplasm of breast: Secondary | ICD-10-CM

## 2014-10-20 ENCOUNTER — Inpatient Hospital Stay (HOSPITAL_COMMUNITY)
Admission: EM | Admit: 2014-10-20 | Discharge: 2014-10-22 | DRG: 184 | Disposition: A | Discharge status: Home / Self Care | Payer: Medicare Other | Attending: Surgery | Admitting: Surgery

## 2014-10-20 ENCOUNTER — Emergency Department (HOSPITAL_COMMUNITY): Payer: Medicare Other

## 2014-10-20 ENCOUNTER — Inpatient Hospital Stay (HOSPITAL_COMMUNITY): Payer: Medicare Other

## 2014-10-20 ENCOUNTER — Encounter (HOSPITAL_COMMUNITY): Payer: Self-pay

## 2014-10-20 DIAGNOSIS — S2249XA Multiple fractures of ribs, unspecified side, initial encounter for closed fracture: Secondary | ICD-10-CM | POA: Diagnosis present

## 2014-10-20 DIAGNOSIS — S2241XA Multiple fractures of ribs, right side, initial encounter for closed fracture: Principal | ICD-10-CM | POA: Diagnosis present

## 2014-10-20 DIAGNOSIS — W11XXXA Fall on and from ladder, initial encounter: Secondary | ICD-10-CM | POA: Diagnosis present

## 2014-10-20 DIAGNOSIS — Z87891 Personal history of nicotine dependence: Secondary | ICD-10-CM

## 2014-10-20 DIAGNOSIS — E739 Lactose intolerance, unspecified: Secondary | ICD-10-CM | POA: Diagnosis present

## 2014-10-20 DIAGNOSIS — J449 Chronic obstructive pulmonary disease, unspecified: Secondary | ICD-10-CM | POA: Diagnosis present

## 2014-10-20 DIAGNOSIS — W19XXXA Unspecified fall, initial encounter: Secondary | ICD-10-CM

## 2014-10-20 DIAGNOSIS — Z888 Allergy status to other drugs, medicaments and biological substances status: Secondary | ICD-10-CM

## 2014-10-20 DIAGNOSIS — S42191A Fracture of other part of scapula, right shoulder, initial encounter for closed fracture: Secondary | ICD-10-CM | POA: Diagnosis present

## 2014-10-20 DIAGNOSIS — S0101XA Laceration without foreign body of scalp, initial encounter: Secondary | ICD-10-CM | POA: Diagnosis present

## 2014-10-20 DIAGNOSIS — I10 Essential (primary) hypertension: Secondary | ICD-10-CM | POA: Diagnosis present

## 2014-10-20 DIAGNOSIS — Z85118 Personal history of other malignant neoplasm of bronchus and lung: Secondary | ICD-10-CM

## 2014-10-20 DIAGNOSIS — Z801 Family history of malignant neoplasm of trachea, bronchus and lung: Secondary | ICD-10-CM | POA: Diagnosis not present

## 2014-10-20 DIAGNOSIS — Z8249 Family history of ischemic heart disease and other diseases of the circulatory system: Secondary | ICD-10-CM

## 2014-10-20 DIAGNOSIS — M810 Age-related osteoporosis without current pathological fracture: Secondary | ICD-10-CM | POA: Diagnosis present

## 2014-10-20 DIAGNOSIS — I700 Atherosclerosis of aorta: Secondary | ICD-10-CM | POA: Diagnosis present

## 2014-10-20 DIAGNOSIS — S270XXA Traumatic pneumothorax, initial encounter: Secondary | ICD-10-CM | POA: Diagnosis present

## 2014-10-20 DIAGNOSIS — E785 Hyperlipidemia, unspecified: Secondary | ICD-10-CM | POA: Diagnosis present

## 2014-10-20 DIAGNOSIS — D62 Acute posthemorrhagic anemia: Secondary | ICD-10-CM | POA: Diagnosis present

## 2014-10-20 DIAGNOSIS — T07XXXA Unspecified multiple injuries, initial encounter: Secondary | ICD-10-CM

## 2014-10-20 DIAGNOSIS — J939 Pneumothorax, unspecified: Secondary | ICD-10-CM

## 2014-10-20 LAB — CBC WITH DIFFERENTIAL/PLATELET
BASOS ABS: 0 10*3/uL (ref 0.0–0.1)
BASOS PCT: 0 % (ref 0–1)
EOS ABS: 0 10*3/uL (ref 0.0–0.7)
EOS PCT: 1 % (ref 0–5)
HCT: 36.5 % (ref 36.0–46.0)
Hemoglobin: 12 g/dL (ref 12.0–15.0)
Lymphocytes Relative: 8 % — ABNORMAL LOW (ref 12–46)
Lymphs Abs: 0.6 10*3/uL — ABNORMAL LOW (ref 0.7–4.0)
MCH: 27.7 pg (ref 26.0–34.0)
MCHC: 32.9 g/dL (ref 30.0–36.0)
MCV: 84.3 fL (ref 78.0–100.0)
Monocytes Absolute: 0.5 10*3/uL (ref 0.1–1.0)
Monocytes Relative: 6 % (ref 3–12)
Neutro Abs: 7 10*3/uL (ref 1.7–7.7)
Neutrophils Relative %: 85 % — ABNORMAL HIGH (ref 43–77)
PLATELETS: 144 10*3/uL — AB (ref 150–400)
RBC: 4.33 MIL/uL (ref 3.87–5.11)
RDW: 14.6 % (ref 11.5–15.5)
WBC: 8.2 10*3/uL (ref 4.0–10.5)

## 2014-10-20 LAB — I-STAT CHEM 8, ED
BUN: 21 mg/dL (ref 6–23)
CREATININE: 0.8 mg/dL (ref 0.50–1.10)
Calcium, Ion: 1.18 mmol/L (ref 1.13–1.30)
Chloride: 99 mEq/L (ref 96–112)
Glucose, Bld: 149 mg/dL — ABNORMAL HIGH (ref 70–99)
HCT: 41 % (ref 36.0–46.0)
HEMOGLOBIN: 13.9 g/dL (ref 12.0–15.0)
Potassium: 3.4 mEq/L — ABNORMAL LOW (ref 3.7–5.3)
SODIUM: 139 meq/L (ref 137–147)
TCO2: 29 mmol/L (ref 0–100)

## 2014-10-20 MED ORDER — SODIUM CHLORIDE 0.9 % IV SOLN
INTRAVENOUS | Status: DC
  Administered 2014-10-20: 23:00:00 via INTRAVENOUS
  Administered 2014-10-20: 125 mL/h via INTRAVENOUS
  Administered 2014-10-21: 07:00:00 via INTRAVENOUS

## 2014-10-20 MED ORDER — HYDROCODONE-ACETAMINOPHEN 5-325 MG PO TABS
1.0000 | ORAL_TABLET | ORAL | Status: DC | PRN
  Filled 2014-10-20: qty 2
  Filled 2014-10-20: qty 1

## 2014-10-20 MED ORDER — MORPHINE SULFATE 2 MG/ML IJ SOLN
1.0000 mg | INTRAMUSCULAR | Status: DC | PRN
  Administered 2014-10-20: 1 mg via INTRAVENOUS
  Filled 2014-10-20: qty 1

## 2014-10-20 MED ORDER — MORPHINE SULFATE 4 MG/ML IJ SOLN
4.0000 mg | Freq: Once | INTRAMUSCULAR | Status: AC
  Administered 2014-10-20: 4 mg via INTRAVENOUS
  Filled 2014-10-20: qty 1

## 2014-10-20 MED ORDER — HYDROCODONE-ACETAMINOPHEN 5-325 MG PO TABS
1.0000 | ORAL_TABLET | ORAL | Status: DC | PRN
  Administered 2014-10-21: 1 via ORAL

## 2014-10-20 MED ORDER — HYDROCHLOROTHIAZIDE 25 MG PO TABS
25.0000 mg | ORAL_TABLET | Freq: Every day | ORAL | Status: DC
  Administered 2014-10-21 – 2014-10-22 (×2): 25 mg via ORAL
  Filled 2014-10-20 (×2): qty 1

## 2014-10-20 MED ORDER — MORPHINE SULFATE 4 MG/ML IJ SOLN: 4.0000 mg | INTRAMUSCULAR | Status: DC | PRN

## 2014-10-20 MED ORDER — ENOXAPARIN SODIUM 30 MG/0.3ML ~~LOC~~ SOLN
30.0000 mg | SUBCUTANEOUS | Status: DC
  Administered 2014-10-21 – 2014-10-22 (×2): 30 mg via SUBCUTANEOUS
  Filled 2014-10-20 (×4): qty 0.3

## 2014-10-20 MED ORDER — MORPHINE SULFATE 2 MG/ML IJ SOLN: 1.0000 mg | INTRAMUSCULAR | Status: DC | PRN

## 2014-10-20 MED ORDER — MORPHINE SULFATE 2 MG/ML IJ SOLN: 2.0000 mg | INTRAMUSCULAR | Status: DC | PRN

## 2014-10-20 MED ORDER — HYDROCODONE-ACETAMINOPHEN 5-325 MG PO TABS: 1.0000 | ORAL_TABLET | ORAL | Status: DC | PRN

## 2014-10-20 MED ORDER — ONDANSETRON HCL 4 MG/2ML IJ SOLN
4.0000 mg | Freq: Once | INTRAMUSCULAR | Status: AC
  Administered 2014-10-20: 4 mg via INTRAVENOUS
  Filled 2014-10-20: qty 2

## 2014-10-20 MED ORDER — HYDROCODONE-ACETAMINOPHEN 5-325 MG PO TABS
2.0000 | ORAL_TABLET | ORAL | Status: DC | PRN
  Administered 2014-10-21 – 2014-10-22 (×2): 2 via ORAL
  Filled 2014-10-20: qty 2

## 2014-10-20 MED ORDER — METOPROLOL TARTRATE 50 MG PO TABS
50.0000 mg | ORAL_TABLET | Freq: Two times a day (BID) | ORAL | Status: DC
  Administered 2014-10-21 – 2014-10-22 (×3): 50 mg via ORAL
  Filled 2014-10-20 (×6): qty 1

## 2014-10-20 MED ORDER — IOHEXOL 300 MG/ML  SOLN
80.0000 mL | Freq: Once | INTRAMUSCULAR | Status: AC | PRN
  Administered 2014-10-20: 80 mL via INTRAVENOUS

## 2014-10-20 MED ORDER — ONDANSETRON HCL 4 MG PO TABS: 4.0000 mg | ORAL_TABLET | Freq: Four times a day (QID) | ORAL | Status: DC | PRN

## 2014-10-20 MED ORDER — ONDANSETRON HCL 4 MG/2ML IJ SOLN
4.0000 mg | Freq: Four times a day (QID) | INTRAMUSCULAR | Status: DC | PRN
  Administered 2014-10-21 (×2): 4 mg via INTRAVENOUS
  Filled 2014-10-20 (×2): qty 2

## 2014-10-20 MED ORDER — HYDROMORPHONE HCL 1 MG/ML IJ SOLN: 1.0000 mg | INTRAMUSCULAR | Status: DC | PRN

## 2014-10-20 MED ORDER — CETYLPYRIDINIUM CHLORIDE 0.05 % MT LIQD
7.0000 mL | Freq: Two times a day (BID) | OROMUCOSAL | Status: DC
  Administered 2014-10-21: 7 mL via OROMUCOSAL

## 2014-10-20 NOTE — ED Notes (Addendum)
Per EMS, Patient was on the second rung of the ladder and started to become unsteady. Patient fell backwards and the patient hit her head on a chopper Shredder Leaf Machine and her shoulder landed on the cement. EMS estimates about four to five feet from the ground. Patient presents with a laceration to the back of the head. Vitals  Per EMS: 141/78, 86 HR, 20 RR, 5/10 Pain 100 mcg of Fentanyl Given. Patient alert and oriented x4 upon arrival. Denies loss of consciousness.

## 2014-10-20 NOTE — ED Notes (Signed)
Patient returned from CT

## 2014-10-20 NOTE — ED Notes (Addendum)
MD at the bedside. Patient returned from [place].

## 2014-10-20 NOTE — ED Notes (Signed)
Xray at the bedside.

## 2014-10-20 NOTE — H&P (Signed)
History   Penny [name] is an 80 y.o. female.   Chief Complaint:  Chief Complaint  Patient presents with  . Fall    Fall  this is a 80 year old female who fell off a ladder in her garage. She had her head on an object nearby. There was no loss of consciousness. She arrived complaining of right chest and back pain. A Dodds laceration on her posterior scalp was repaired in the emergency department by the staff there. During her workup, she was found to have right rib fractures and a Galiano right pneumothorax. She currently denies shortness of breath. Her O2 sats have been stable.  She denies neck pain or abdominal pain.  Past Medical History  Diagnosis Date  . Hypertension   . Hyperlipidemia   . Lung cancer, upper lobe 12/04/1996    RUL  . Osteoporosis   . Thigh shingles 12/04/1996  . S/P partial lobectomy of lung     [id number]  . Vitamin D deficiency   . Thoracic aorta atherosclerosis   . COPD (chronic obstructive pulmonary disease) with emphysema     Past Surgical History  Procedure Laterality Date  . Carpal tunnel release Right     2009  . Shoulder adhesion release Right     2004  . Lung removal, partial Right     RUL 1998    Family History  Problem Relation Age of Onset  . Heart disease Mother   . Lung cancer Father    Social History:  reports that she quit smoking about 17 years ago. She has never used smokeless tobacco. She reports that she drinks about 0.6 oz of alcohol per week. She reports that she does not use illicit drugs.  Allergies   Allergies  Allergen Reactions  . Ace Inhibitors Cough  . Calcium-Containing Compounds Other (See Comments)    constpation  . Lactose Intolerance (Gi) Diarrhea    Home Medications   (Not in a hospital admission)  Trauma Course   Results for orders placed or performed during the hospital encounter of 10/20/14 (from the past 48 hour(s))  CBC with Differential     Status: Abnormal   Collection Time: 10/20/14  5:58 PM   Result Value Ref Range   WBC 8.2 4.0 - 10.5 K/uL   RBC 4.33 3.87 - 5.11 MIL/uL   Hemoglobin 12.0 12.0 - 15.0 g/dL   HCT 36.5 36.0 - 46.0 %   MCV 84.3 78.0 - 100.0 fL   MCH 27.7 26.0 - 34.0 pg   MCHC 32.9 30.0 - 36.0 g/dL   RDW 14.6 11.5 - 15.5 %   Platelets 144 (L) 150 - 400 K/uL   Neutrophils Relative % 85 (H) 43 - 77 %   Neutro Abs 7.0 1.7 - 7.7 K/uL   Lymphocytes Relative 8 (L) 12 - 46 %   Lymphs Abs 0.6 (L) 0.7 - 4.0 K/uL   Monocytes Relative 6 3 - 12 %   Monocytes Absolute 0.5 0.1 - 1.0 K/uL   Eosinophils Relative 1 0 - 5 %   Eosinophils Absolute 0.0 0.0 - 0.7 K/uL   Basophils Relative 0 0 - 1 %   Basophils Absolute 0.0 0.0 - 0.1 K/uL  I-Stat Chem 8, ED     Status: Abnormal   Collection Time: 10/20/14  6:16 PM  Result Value Ref Range   Sodium 139 137 - 147 mEq/L   Potassium 3.4 (L) 3.7 - 5.3 mEq/L   Chloride 99 96 - 112 mEq/L  BUN 21 6 - 23 mg/dL   Creatinine, Ser 0.80 0.50 - 1.10 mg/dL   Glucose, Bld 149 (H) 70 - 99 mg/dL   Calcium, Ion 1.18 1.13 - 1.30 mmol/L   TCO2 29 0 - 100 mmol/L   Hemoglobin 13.9 12.0 - 15.0 g/dL   HCT 41.0 36.0 - 46.0 %   Dg Thoracic Spine 2 View  10/20/2014   CLINICAL DATA:  Thoracic spine pain after fall off ladder.  EXAM: THORACIC SPINE - 2 VIEW  COMPARISON:  None.  FINDINGS: There is no evidence of thoracic spine fracture. Alignment is normal. No other significant bone abnormalities are identified.  IMPRESSION: Normal thoracic spine.   Electronically Signed   By: Sabino Dick M.D.   On: 10/20/2014 17:12   Dg Shoulder Right  10/20/2014   CLINICAL DATA:  [place] from ladder  EXAM: RIGHT SHOULDER - 2+ VIEW  COMPARISON:  None.  FINDINGS: Glenohumeral joint is intact. No evidence of scapular fracture or humeral fracture. The acromioclavicular joint is intact.  IMPRESSION: No fracture or dislocation.   Electronically Signed   By: Suzy Bouchard M.D.   On: 10/20/2014 17:11   Ct Head Wo Contrast  10/20/2014   CLINICAL DATA:  Fall from ladder   EXAM: CT HEAD WITHOUT CONTRAST  CT CERVICAL SPINE WITHOUT CONTRAST  TECHNIQUE: Multidetector CT imaging of the head and cervical spine was performed following the standard protocol without intravenous contrast. Multiplanar CT image reconstructions of the cervical spine were also generated.  COMPARISON:  None.  FINDINGS: CT HEAD FINDINGS  No skull fracture is noted. No intracranial hemorrhage, mass effect or midline shift. Moderate cerebral atrophy. Periventricular and patchy subcortical extensive white matter decreased attenuation probable due to chronic Tith vessel ischemic changes. No acute cortical infarction. No mass lesion is noted on this unenhanced scan.  CT CERVICAL SPINE FINDINGS  Axial images of the cervical spine shows no acute fracture or subluxation.  There is soft tissue air in right paraspinal region and right deep chest wall. Best seen in axial image 57. There is pneumothorax in right apex and right upper lung anteriorly.  Computer processed images shows degenerative changes C1-C2 articulation. Significant disc space flattening noted at C4-C5-C5-C6 and C6-C7 level. There is about 3.5 mm anterolisthesis C3 on C4 vertebral body. No prevertebral soft tissue swelling. Cervical airway is patent.  IMPRESSION: 1. No acute intracranial abnormality. Mild cerebral atrophy. Periventricular and extensive patchy subcortical white matter decreased attenuation probable due to chronic Tax vessel ischemic changes. 2. No cervical spine acute fracture or subluxation. Degenerative changes as described above. There is deep soft tissue air in right upper paraspinal region and right upper chest wall. There is right pneumothorax.   Electronically Signed   By: Lahoma Crocker M.D.   On: 10/20/2014 16:53   Ct Cervical Spine Wo Contrast  10/20/2014   CLINICAL DATA:  Fall from ladder  EXAM: CT HEAD WITHOUT CONTRAST  CT CERVICAL SPINE WITHOUT CONTRAST  TECHNIQUE: Multidetector CT imaging of the head and cervical spine was  performed following the standard protocol without intravenous contrast. Multiplanar CT image reconstructions of the cervical spine were also generated.  COMPARISON:  None.  FINDINGS: CT HEAD FINDINGS  No skull fracture is noted. No intracranial hemorrhage, mass effect or midline shift. Moderate cerebral atrophy. Periventricular and patchy subcortical extensive white matter decreased attenuation probable due to chronic Mault vessel ischemic changes. No acute cortical infarction. No mass lesion is noted on this unenhanced scan.  CT CERVICAL SPINE  FINDINGS  Axial images of the cervical spine shows no acute fracture or subluxation.  There is soft tissue air in right paraspinal region and right deep chest wall. Best seen in axial image 57. There is pneumothorax in right apex and right upper lung anteriorly.  Computer processed images shows degenerative changes C1-C2 articulation. Significant disc space flattening noted at C4-C5-C5-C6 and C6-C7 level. There is about 3.5 mm anterolisthesis C3 on C4 vertebral body. No prevertebral soft tissue swelling. Cervical airway is patent.  IMPRESSION: 1. No acute intracranial abnormality. Mild cerebral atrophy. Periventricular and extensive patchy subcortical white matter decreased attenuation probable due to chronic Bye vessel ischemic changes. 2. No cervical spine acute fracture or subluxation. Degenerative changes as described above. There is deep soft tissue air in right upper paraspinal region and right upper chest wall. There is right pneumothorax.   Electronically Signed   By: Lahoma Crocker M.D.   On: 10/20/2014 16:53   Dg Chest Port 1 View  10/20/2014   CLINICAL DATA:  Fall from ladder with right-sided chest and shoulder pain.  EXAM: PORTABLE CHEST - 1 VIEW  COMPARISON:  None.  FINDINGS: Trachea is midline. Heart size normal. Thoracic aorta is calcified and somewhat uncoiled. Minimal bibasilar atelectasis. No definite pneumothorax. Postoperative changes are seen in the  right hilum. No pleural fluid.  There are acute appearing fractures of the right fourth, fifth and sixth posterior ribs. Right clavicle appears intact. Right scapula is poorly evaluated.  IMPRESSION: 1. Nondisplaced fractures of the right fourth, fifth and sixth posterior ribs. 2. Right scapula is poorly assessed on this single AP view. 3. No definite pneumothorax. 4. Mild bibasilar atelectasis, right greater than left.   Electronically Signed   By: Lorin Picket M.D.   On: 10/20/2014 18:07    Review of Systems  All other systems reviewed and are negative.   Blood pressure 124/65, pulse 80, temperature 97.5 F (36.4 C), temperature source Oral, resp. rate 16, SpO2 91 %. Physical Exam  Constitutional: She is oriented to person, place, and time. She appears well-developed and well-nourished. No distress.  HENT:  Head: Normocephalic.  Right Ear: External ear normal.  Left Ear: External ear normal.  Nose: Nose normal.  Mouth/Throat: Oropharynx is clear and moist.  Peake laceration on the posterior scalp with staples  Eyes: Conjunctivae are normal. Pupils are equal, round, and reactive to light. Right eye exhibits no discharge. Left eye exhibits no discharge. No scleral icterus.  Neck: Normal range of motion. Neck supple. No tracheal deviation present.  No cervical spine tenderness  Cardiovascular: Normal rate, regular rhythm and normal heart sounds.   No murmur heard. Respiratory: Effort normal and breath sounds normal. She exhibits tenderness.  There is right sided posterior chest tenderness. Lungs are clear  GI: Soft. Bowel sounds are normal. She exhibits no distension. There is no tenderness.  Musculoskeletal: Normal range of motion. She exhibits no edema or tenderness.  No long bone abnormalities  Neurological: She is alert and oriented to person, place, and time.  Skin: Skin is warm and dry. No rash noted. No erythema.  Psychiatric: Her behavior is normal. Judgment normal.      Assessment/Plan Multiple right rib fractures and Ewings pneumothorax status post fall  A CAT scan of the chest is pending. Plan currently is to hold on chest tube insertion unless she becomes symptomatic or the pneumothorax enlarges. She will be admitted to the stepdown unit for close monitoring and pain control. I explained to the patient  and her family in detail and they agree with the admission  Parris Signer A 10/20/2014, 6:39 PM   Procedures

## 2014-10-20 NOTE — Plan of Care (Signed)
Problem: Phase I Progression Outcomes Goal: Pain controlled with appropriate interventions Outcome: Progressing Goal: OOB as tolerated unless otherwise ordered Outcome: Completed/Met Date Met:  10/20/14 Goal: Voiding-avoid urinary catheter unless indicated Outcome: Completed/Met Date Met:  10/20/14 Goal: Hemodynamically stable Outcome: Progressing

## 2014-10-20 NOTE — ED Notes (Signed)
Attempted to call report x 1  

## 2014-10-20 NOTE — ED Provider Notes (Signed)
Care transferred to me by Dr. Jeneen Rinks. Pending head CT, Plan is to repair laceration to posterior head and discharge home to follow-up with her primary care. Imaging shows multiple rib frx and is suggestive of potential pneumothorax. Trauma saw in ED, admitted. Pt is AAO x 4. Managed nausea and pain during ED stay.  LACERATION REPAIR Performed by: Verl Dicker Authorized by: Verl Dicker Consent: Verbal consent obtained. Risks and benefits: risks, benefits and alternatives were discussed Consent given by: patient Patient identity confirmed: provided demographic data Prepped and Draped in normal sterile fashion Wound explored  Laceration Location: midline posterior occiput  Laceration Length: 2.5cm  No Foreign Bodies seen or palpated  Anesthesia: local infiltration  Local anesthetic: none  Anesthetic total: 0 ml  Irrigation method: syringe Amount of cleaning: standard  Skin closure: Staples  Number of sutures: 3 staples  Patient tolerance: Patient tolerated the procedure well with no immediate complications.   Viona Gilmore [place], PA-C 10/20/14 [place] Alvino [place], MD 10/21/14 [phone]

## 2014-10-20 NOTE — ED Provider Notes (Signed)
CSN: [id number]     Arrival date & time 10/20/14  1450 History   First MD Initiated Contact with Patient 10/20/14 1503     Chief Complaint  Patient presents with  . Fall     HPI  Patient present for evaluation after a fall. She was using a ladder to put some summer supplies up into a copy all her garage. She was on about 4 step of a ladder. The ladder went to the right, she went to the left. She landed on her right side. She struck her head against edge of a piece of lawn equipment has a laceration of the back of her scalp. No neck pain. No loss of consciousness. Has some discomfort in the neck and shoulder. She is ambulatory since that time. She presents here via EMS.  Past Medical History  Diagnosis Date  . Hypertension   . Hyperlipidemia   . Lung cancer, upper lobe 12/04/1996    RUL  . Osteoporosis   . Thigh shingles 12/04/1996  . S/P partial lobectomy of lung     [id number]  . Vitamin D deficiency   . Thoracic aorta atherosclerosis   . COPD (chronic obstructive pulmonary disease) with emphysema    Past Surgical History  Procedure Laterality Date  . Carpal tunnel release Right     2009  . Shoulder adhesion release Right     2004  . Lung removal, partial Right     RUL 1998   Family History  Problem Relation Age of Onset  . Heart disease Mother   . Lung cancer Father    History  Substance Use Topics  . Smoking status: Former Smoker    Quit date: 12/04/1996  . Smokeless tobacco: Never Used  . Alcohol Use: 0.6 oz/week    1 Glasses of wine per week   OB History    No data available     Review of Systems  Constitutional: Negative for fever, chills, diaphoresis, appetite change and fatigue.  HENT: Negative for mouth sores, sore throat and trouble swallowing.   Eyes: Negative for visual disturbance.  Respiratory: Negative for cough, chest tightness, shortness of breath and wheezing.   Cardiovascular: Negative for chest pain.  Gastrointestinal: Negative for nausea,  vomiting, abdominal pain, diarrhea and abdominal distention.  Endocrine: Negative for polydipsia, polyphagia and polyuria.  Genitourinary: Negative for dysuria, frequency and hematuria.  Musculoskeletal: Negative for gait problem.  Skin: Positive for wound. Negative for color change and pallor.  Neurological: Positive for headaches. Negative for dizziness, syncope and light-headedness.  Hematological: Does not bruise/bleed easily.  Psychiatric/Behavioral: Negative for behavioral problems and confusion.      Allergies  Ace inhibitors; Calcium-containing compounds; and Lactose intolerance (gi)  Home Medications   Prior to Admission medications   Medication Sig Start Date End Date Taking? Authorizing Provider  aspirin 81 MG chewable tablet Chew 81 mg by mouth daily.   Yes Historical Provider, MD  Cholecalciferol (VITAMIN D PO) Take 1-2 tablets by mouth See admin instructions. Vitamin d 5000 units per patient. Take twice every day except on Monday, Wednesday, and Fridays take 1 tablet   Yes Historical Provider, MD  diphenhydrAMINE (SOMINEX) 25 MG tablet Take 25 mg by mouth 2 (two) times daily. Take one in the morning and another one every evening per patient   Yes Historical Provider, MD  diphenhydramine-acetaminophen (TYLENOL PM) 25-500 MG TABS Take 1 tablet by mouth at bedtime. Take every night per patient   Yes Historical Provider, MD  hydrochlorothiazide (HYDRODIURIL) 25 MG tablet TAKE 1 TABLET BY MOUTH EVERY DAY   Yes Unk Pinto, MD  loperamide (IMODIUM) 2 MG capsule Take 2 mg by mouth 2 (two) times daily. [employer] brand anti-diarrhea. Take every day per patient   Yes Historical Provider, MD  magnesium gluconate (MAGONATE) 500 MG tablet Take 500 mg by mouth daily.    Yes Historical Provider, MD  metoprolol (LOPRESSOR) 50 MG tablet TAKE 1 TABLET BY MOUTH TWICE DAILY 05/06/14  Yes Melissa R Smith, PA-C  simvastatin (ZOCOR) 40 MG tablet TAKE ONE TABLET BY MOUTH EVERY NIGHT AT BEDTIME FOR  CHOLESTEROL 08/05/14  Yes Unk Pinto, MD  acetaminophen (TYLENOL) 325 MG tablet Take 2 tablets (650 mg total) by mouth every 6 (six) hours. 10/22/14   Megan N Dort, PA-C  ALPRAZolam (XANAX) 0.5 MG tablet Take 1 tablet (0.5 mg total) by mouth 3 (three) times daily as needed for sleep or anxiety. Patient not taking: Reported on 10/20/2014 07/21/14 07/21/15  Vicie Mutters, PA-C  azelastine (ASTELIN) 0.1 % nasal spray Use i1 to 2 sprays each nostril once or twice day Patient not taking: Reported on 10/20/2014 04/15/14 04/16/15  Unk Pinto, MD  Biotin 10 MG TABS Take 10 mg by mouth.    Historical Provider, MD  cholecalciferol (VITAMIN D) 1000 UNITS tablet Take 10,000 Units by mouth 4 (four) times a week.    Historical Provider, MD  docusate sodium 100 MG CAPS Take 100 mg by mouth 2 (two) times daily as needed for mild constipation. 10/22/14   Megan N Dort, PA-C  HYDROcodone-acetaminophen (NORCO/VICODIN) 5-325 MG per tablet Take 1-2 tablets by mouth every 6 (six) hours as needed for moderate pain or severe pain (mild pain). 10/22/14   Megan N Dort, PA-C  methocarbamol (ROBAXIN) 750 MG tablet Take 1 tablet (750 mg total) by mouth every 8 (eight) hours as needed for muscle spasms. 10/22/14   Megan N Dort, PA-C  polyethylene glycol (MIRALAX / GLYCOLAX) packet Take 17 g by mouth daily as needed for mild constipation or moderate constipation. 10/22/14   Megan N Dort, PA-C  promethazine (PHENERGAN) 12.5 MG tablet Take 1 tablet (12.5 mg total) by mouth every 8 (eight) hours as needed for nausea or vomiting. 10/22/14   Megan N Dort, PA-C  traMADol (ULTRAM) 50 MG tablet Take 1-2 tablets (50-100 mg total) by mouth every 6 (six) hours as needed for moderate pain or severe pain. 10/22/14   Megan N Dort, PA-C  zolpidem (AMBIEN) 5 MG tablet Take 1 tablet (5 mg total) by mouth at bedtime as needed for sleep. Patient not taking: Reported on 10/20/2014 07/21/14   Vicie Mutters, PA-C   BP 106/58 mmHg  Pulse 65   Temp(Src) 97.5 F (36.4 C) (Oral)  Resp 15  Ht 5\' 3"  (1.6 m)  Wt 134 lb 0.6 oz (60.8 kg)  BMI 23.75 kg/m2  SpO2 95% Physical Exam  Constitutional: She is oriented to person, place, and time. She appears well-developed and well-nourished. No distress.  HENT:  Head: Normocephalic.    No N the TMs, mastoids, or from ears nose or mouth.  Eyes: Conjunctivae are normal. Pupils are equal, round, and reactive to light. No scleral icterus.  Neck: Normal range of motion. Neck supple. No thyromegaly present.  Cardiovascular: Normal rate and regular rhythm.  Exam reveals no gallop and no friction rub.   No murmur heard. Pulmonary/Chest: Effort normal and breath sounds normal. No respiratory distress. She has no wheezes. She has no rales.  Abdominal: Soft. Bowel sounds are normal. She exhibits no distension. There is no tenderness. There is no rebound.  Musculoskeletal: Normal range of motion.       Back:  Neurological: She is alert and oriented to person, place, and time.  Normal symmetric Strength to shoulder shrug, triceps, biceps, grip,wrist flex/extend,and intrinsics  Norma lsymmetric sensation above and below clavicles, and to all distributions to UEs. Norma symmetric strength to flex/.extend hip and knees, dorsi/plantar flex ankles. Normal symmetric sensation to all distributions to LEs Patellar and achilles reflexes 1-2+. Downgoing Babinski   Skin: Skin is warm and dry. No rash noted.  Psychiatric: She has a normal mood and affect. Her behavior is normal.    ED Course  Procedures (including critical care time) Labs Review Labs Reviewed  CBC WITH DIFFERENTIAL - Abnormal; Notable for the following:    Platelets 144 (*)    Neutrophils Relative % 85 (*)    Lymphocytes Relative 8 (*)    Lymphs Abs 0.6 (*)    All other components within normal limits  CBC - Abnormal; Notable for the following:    Hemoglobin 10.9 (*)    HCT 32.6 (*)    Platelets 145 (*)    All other components  within normal limits  BASIC METABOLIC PANEL - Abnormal; Notable for the following:    Potassium 3.3 (*)    Glucose, Bld 112 (*)    GFR calc non Af Amer 81 (*)    All other components within normal limits  CBC - Abnormal; Notable for the following:    Hemoglobin 11.4 (*)    HCT 35.8 (*)    All other components within normal limits  I-STAT CHEM 8, ED - Abnormal; Notable for the following:    Potassium 3.4 (*)    Glucose, Bld 149 (*)    All other components within normal limits  OCCULT BLOOD, POC DEVICE - Abnormal; Notable for the following:    Fecal Occult Bld POSITIVE (*)    All other components within normal limits  MRSA PCR SCREENING    Imaging Review Dg Chest Port 1 View  10/22/2014   CLINICAL DATA:  80 year old female status post fall off ladder onto concrete with right rib fractures and pneumothorax. Initial encounter.  EXAM: PORTABLE CHEST - 1 VIEW  COMPARISON:  10/21/2014 and earlier.  FINDINGS: Portable AP semi upright view at 0504 hrs. Persistent right pneumothorax, but less apparent. Pleural edge is still visible at the right lung base. Stable lung volumes. Stable cardiac size and mediastinal contours. Visualized tracheal air column is within normal limits. decreased right supraclavicular subcutaneous gas. Stable left lung, with suggestion of Schlie pleural effusion.  IMPRESSION: 1. Right pneumothorax has mildly decreased. 2. Otherwise stable chest.   Electronically Signed   By: Lars Pinks M.D.   On: 10/22/2014 07:28     EKG Interpretation None      MDM   Final diagnoses:  Fall  Scalp laceration, initial encounter  Multiple contusions    Laceration repair. Awaiting CT head and neck, right shoulder, and T-spine.    Tanna Furry, MD 10/23/14 [phone]

## 2014-10-21 ENCOUNTER — Inpatient Hospital Stay (HOSPITAL_COMMUNITY): Payer: Medicare Other

## 2014-10-21 LAB — CBC
HCT: 32.6 % — ABNORMAL LOW (ref 36.0–46.0)
HEMOGLOBIN: 10.9 g/dL — AB (ref 12.0–15.0)
MCH: 28.2 pg (ref 26.0–34.0)
MCHC: 33.4 g/dL (ref 30.0–36.0)
MCV: 84.2 fL (ref 78.0–100.0)
Platelets: 145 10*3/uL — ABNORMAL LOW (ref 150–400)
RBC: 3.87 MIL/uL (ref 3.87–5.11)
RDW: 14.9 % (ref 11.5–15.5)
WBC: 5.7 10*3/uL (ref 4.0–10.5)

## 2014-10-21 LAB — BASIC METABOLIC PANEL
Anion gap: 9 (ref 5–15)
BUN: 16 mg/dL (ref 6–23)
CHLORIDE: 101 meq/L (ref 96–112)
CO2: 28 mEq/L (ref 19–32)
Calcium: 8.5 mg/dL (ref 8.4–10.5)
Creatinine, Ser: 0.67 mg/dL (ref 0.50–1.10)
GFR, EST NON AFRICAN AMERICAN: 81 mL/min — AB (ref >90)
Glucose, Bld: 112 mg/dL — ABNORMAL HIGH (ref 70–99)
Potassium: 3.3 mEq/L — ABNORMAL LOW (ref 3.7–5.3)
SODIUM: 138 meq/L (ref 137–147)

## 2014-10-21 LAB — MRSA PCR SCREENING: MRSA by PCR: NEGATIVE

## 2014-10-21 LAB — OCCULT BLOOD, POC DEVICE: FECAL OCCULT BLD: POSITIVE — AB

## 2014-10-21 MED ORDER — IPRATROPIUM-ALBUTEROL 0.5-2.5 (3) MG/3ML IN SOLN: 3.0000 mL | RESPIRATORY_TRACT | Status: DC | PRN

## 2014-10-21 MED ORDER — POLYETHYLENE GLYCOL 3350 17 G PO PACK
17.0000 g | PACK | Freq: Every day | ORAL | Status: DC | PRN
  Filled 2014-10-21 (×2): qty 1

## 2014-10-21 MED ORDER — IPRATROPIUM-ALBUTEROL 0.5-2.5 (3) MG/3ML IN SOLN
3.0000 mL | RESPIRATORY_TRACT | Status: DC
  Administered 2014-10-21: 3 mL via RESPIRATORY_TRACT
  Filled 2014-10-21: qty 3

## 2014-10-21 MED ORDER — PROMETHAZINE HCL 25 MG/ML IJ SOLN
12.5000 mg | Freq: Once | INTRAMUSCULAR | Status: AC
  Administered 2014-10-22: 12.5 mg via INTRAVENOUS
  Filled 2014-10-21: qty 1

## 2014-10-21 MED ORDER — DOCUSATE SODIUM 100 MG PO CAPS
100.0000 mg | ORAL_CAPSULE | Freq: Two times a day (BID) | ORAL | Status: DC
  Administered 2014-10-21 – 2014-10-22 (×2): 100 mg via ORAL
  Filled 2014-10-21 (×3): qty 1

## 2014-10-21 MED ORDER — ALUM & MAG HYDROXIDE-SIMETH 200-200-20 MG/5ML PO SUSP
30.0000 mL | ORAL | Status: DC | PRN
  Administered 2014-10-21: 30 mL via ORAL
  Filled 2014-10-21: qty 30

## 2014-10-21 MED ORDER — SIMVASTATIN 40 MG PO TABS
40.0000 mg | ORAL_TABLET | Freq: Every day | ORAL | Status: DC
  Administered 2014-10-21: 40 mg via ORAL
  Filled 2014-10-21 (×3): qty 1

## 2014-10-21 NOTE — Progress Notes (Signed)
Orthopedic Tech Progress Note Patient Details:  Penny [name] 1932/12/18 [id number]  Ortho Devices Type of Ortho Device: Arm sling Ortho Device/Splint Location: rue Ortho Device/Splint Interventions: Application   [name] 10/21/2014, 11:27 AM

## 2014-10-21 NOTE — Progress Notes (Signed)
UR completed.  Shamarra Warda, RN BSN [facility] CCM Trauma/Neuro ICU Case Manager [phone]  

## 2014-10-21 NOTE — Progress Notes (Signed)
Report called, pt transferring to [id number] via w/c with belongings.

## 2014-10-21 NOTE — Progress Notes (Signed)
   10/21/14 1500  Clinical Encounter Type  Visited With Patient  Visit Type Initial  Referral From Nurse   Chaplain was referred to patient via spiritual care consult. Chaplain spoke with patient briefly. Patient explained she had just had the lights turned off and was going to take a nap. Patient asked that chaplain come back tomorrow. Chaplain plans to follow up with patient tomorrow. Gar Ponto, Chaplain  3:42 PM

## 2014-10-21 NOTE — Progress Notes (Signed)
[name]  Progress Note   LOS: 1 day   Subjective: Pt doing well.  Sitting up in the chair, eating breakfast.  Mobilizing well.  Denies leg or left arm pain.  C/o pain when she lifts her right arm (pain in her scapula/back).  C/o rib pain.  No more nausea, and no vomiting.  Urinating well, no BM yet.  No abdominal pain, neck/back pain, extremity pain (other than right shoulder).  Objective: Vital signs in last 24 hours: Temp:  [97.5 F (36.4 C)-99.1 F (37.3 C)] 98.1 F (36.7 C) (11/18 0700) Pulse Rate:  [60-88] 73 (11/18 0729) Resp:  [12-18] 12 (11/18 0729) BP: (93-137)/(50-98) 114/56 mmHg (11/18 0729) SpO2:  [89 %-100 %] 97 % (11/18 0729) Weight:  [134 lb 0.6 oz (60.8 kg)] 134 lb 0.6 oz (60.8 kg) (11/17 2014) Last BM Date: 10/20/14 (per patient report )  Lab Results:  CBC  Recent Labs  10/20/14 1758 10/20/14 1816 10/21/14 0318  WBC 8.2  --  5.7  HGB 12.0 13.9 10.9*  HCT 36.5 41.0 32.6*  PLT 144*  --  145*   BMET  Recent Labs  10/20/14 1816 10/21/14 0318  NA 139 138  K 3.4* 3.3*  CL 99 101  CO2  --  28  GLUCOSE 149* 112*  BUN 21 16  CREATININE 0.80 0.67  CALCIUM  --  8.5    Imaging: Dg Thoracic Spine 2 View  10/20/2014   CLINICAL DATA:  Thoracic spine pain after fall off ladder.  EXAM: THORACIC SPINE - 2 VIEW  COMPARISON:  None.  FINDINGS: There is no evidence of thoracic spine fracture. Alignment is normal. No other significant bone abnormalities are identified.  IMPRESSION: Normal thoracic spine.   Electronically Signed   By: Sabino Dick M.D.   On: 10/20/2014 17:12   Dg Shoulder Right  10/20/2014   CLINICAL DATA:  [place] from ladder  EXAM: RIGHT SHOULDER - 2+ VIEW  COMPARISON:  None.  FINDINGS: Glenohumeral joint is intact. No evidence of scapular fracture or humeral fracture. The acromioclavicular joint is intact.  IMPRESSION: No fracture or dislocation.   Electronically Signed   By: Suzy Bouchard M.D.   On: 10/20/2014  17:11   Ct Head Wo Contrast  10/20/2014   CLINICAL DATA:  Fall from ladder  EXAM: CT HEAD WITHOUT CONTRAST  CT CERVICAL SPINE WITHOUT CONTRAST  TECHNIQUE: Multidetector CT imaging of the head and cervical spine was performed following the standard protocol without intravenous contrast. Multiplanar CT image reconstructions of the cervical spine were also generated.  COMPARISON:  None.  FINDINGS: CT HEAD FINDINGS  No skull fracture is noted. No intracranial hemorrhage, mass effect or midline shift. Moderate cerebral atrophy. Periventricular and patchy subcortical extensive white matter decreased attenuation probable due to chronic [place] vessel ischemic changes. No acute cortical infarction. No mass lesion is noted on this unenhanced scan.  CT CERVICAL SPINE FINDINGS  Axial images of the cervical spine shows no acute fracture or subluxation.  There is soft tissue air in right paraspinal region and right deep chest wall. Best seen in axial image 57. There is pneumothorax in right apex and right upper lung anteriorly.  Computer processed images shows degenerative changes C1-C2 articulation. Significant disc space flattening noted at C4-C5-C5-C6 and C6-C7 level. There is about 3.5 mm anterolisthesis C3 on C4 vertebral body. No prevertebral soft tissue swelling. Cervical airway is patent.  IMPRESSION: 1. No acute intracranial abnormality. Mild cerebral atrophy. Periventricular and extensive  patchy subcortical white matter decreased attenuation probable due to chronic [place] vessel ischemic changes. 2. No cervical spine acute fracture or subluxation. Degenerative changes as described above. There is deep soft tissue air in right upper paraspinal region and right upper chest wall. There is right pneumothorax.   Electronically Signed   By: Lahoma Crocker M.D.   On: 10/20/2014 16:53   Ct Chest W Contrast  10/20/2014   CLINICAL DATA:  Fall backwards from ladder with chest pain  EXAM: CT CHEST WITH CONTRAST  TECHNIQUE:  Multidetector CT imaging of the chest was performed during intravenous contrast administration.  CONTRAST:  48mL OMNIPAQUE IOHEXOL 300 MG/ML  SOLN  COMPARISON:  Chest x-ray from earlier in the same day.  FINDINGS: The left lung is well aerated without focal infiltrate or sizable effusion. A right-sided pneumothorax is noted of a mild degree most notably in the right lung base. Mild emphysematous changes are seen. A Romo right-sided pleural effusion is noted. The thoracic inlet is within normal limits. The thoracic AA or shows diffuse calcifications without aneurysmal dilatation. Coronary calcifications are seen. No definitive pulmonary artery is noted centrally. Scanning into the upper abdomen reveals no acute abnormality. Multiple hepatic cysts are seen.  The bony structures show a fracture through the tip of the scapula inferiorly. The thoracic spine demonstrates degenerative changes. No compression deformities are seen. All sternal fracture is noted. The right rib cage shows fractures of the right fourth, fifth and sixth ribs posteriorly. No other definitive rib fractures noted.  IMPRESSION: Right rib fractures with associated pneumothorax predominantly in the base. Mild subcutaneous emphysema is seen. A [place] right effusion is noted. The remainder of the exam shows no acute abnormality. Mild emphysematous changes are seen.  Critical Value/emergent results were called by telephone at the time of interpretation on 10/20/2014 at 7:48 pm to Dr. Davonna Belling , who verbally acknowledged these results.   Electronically Signed   By: Inez Catalina M.D.   On: 10/20/2014 19:49   Ct Cervical Spine Wo Contrast  10/20/2014   CLINICAL DATA:  Fall from ladder  EXAM: CT HEAD WITHOUT CONTRAST  CT CERVICAL SPINE WITHOUT CONTRAST  TECHNIQUE: Multidetector CT imaging of the head and cervical spine was performed following the standard protocol without intravenous contrast. Multiplanar CT image reconstructions of the cervical  spine were also generated.  COMPARISON:  None.  FINDINGS: CT HEAD FINDINGS  No skull fracture is noted. No intracranial hemorrhage, mass effect or midline shift. Moderate cerebral atrophy. Periventricular and patchy subcortical extensive white matter decreased attenuation probable due to chronic [place] vessel ischemic changes. No acute cortical infarction. No mass lesion is noted on this unenhanced scan.  CT CERVICAL SPINE FINDINGS  Axial images of the cervical spine shows no acute fracture or subluxation.  There is soft tissue air in right paraspinal region and right deep chest wall. Best seen in axial image 57. There is pneumothorax in right apex and right upper lung anteriorly.  Computer processed images shows degenerative changes C1-C2 articulation. Significant disc space flattening noted at C4-C5-C5-C6 and C6-C7 level. There is about 3.5 mm anterolisthesis C3 on C4 vertebral body. No prevertebral soft tissue swelling. Cervical airway is patent.  IMPRESSION: 1. No acute intracranial abnormality. Mild cerebral atrophy. Periventricular and extensive patchy subcortical white matter decreased attenuation probable due to chronic Brach vessel ischemic changes. 2. No cervical spine acute fracture or subluxation. Degenerative changes as described above. There is deep soft tissue air in right upper paraspinal region and right  upper chest wall. There is right pneumothorax.   Electronically Signed   By: Lahoma Crocker M.D.   On: 10/20/2014 16:53   Dg Chest Port 1 View  10/21/2014   CLINICAL DATA:  80 year old female with right rib fractures and pneumothorax after fall from ladder. Initial encounter.  EXAM: PORTABLE CHEST - 1 VIEW  COMPARISON:  Chest CT and radiographs 10/20/2014 and earlier.  FINDINGS: Portable AP view at 0613 hrs. [place] to moderate right pneumothorax, with pleural edge visible along the lateral lung and at the costophrenic angle. This is increased from the portable radiograph at 1728 hr yesterday. Stable  cardiac size and mediastinal contours. No pulmonary edema. No definite pleural effusion. No consolidation or confluent pulmonary opacity. Right supraclavicular subcutaneous gas is stable.  IMPRESSION: [place] to moderate right side pneumothorax appears increased compared to the portable film from 1728 hrs yesterday, but probably is stable compared to the subsequent chest CT at 1927 hrs.   Electronically Signed   By: Lars Pinks M.D.   On: 10/21/2014 07:19   Dg Chest Port 1 View  10/20/2014   CLINICAL DATA:  Fall from ladder with right-sided chest and shoulder pain.  EXAM: PORTABLE CHEST - 1 VIEW  COMPARISON:  None.  FINDINGS: Trachea is midline. Heart size normal. Thoracic aorta is calcified and somewhat uncoiled. Minimal bibasilar atelectasis. No definite pneumothorax. Postoperative changes are seen in the right hilum. No pleural fluid.  There are acute appearing fractures of the right fourth, fifth and sixth posterior ribs. Right clavicle appears intact. Right scapula is poorly evaluated.  IMPRESSION: 1. Nondisplaced fractures of the right fourth, fifth and sixth posterior ribs. 2. Right scapula is poorly assessed on this single AP view. 3. No definite pneumothorax. 4. Mild bibasilar atelectasis, right greater than left.   Electronically Signed   By: Lorin Picket M.D.   On: 10/20/2014 18:07     PE: General: pleasant, WD/WN white female who is laying in bed in NAD HEENT: head is normocephalic, laceration to posterior head with 4 staples in place.  Sclera are noninjected.  PERRL.  Ears and nose without any masses or lesions.  Mouth is pink and moist Heart: regular, rate, and rhythm.  Normal s1,s2. No obvious murmurs, gallops, or rubs noted.  Palpable radial and pedal pulses bilaterally Lungs: CTAB, no wheezes, rhonchi, or rales noted.  Respiratory effort nonlabored. Chest wall tenderness to posterior ribs on right.   Abd: soft, NT/ND, +BS, no masses, hernias, or organomegaly MS: all 4 extremities are  symmetrical with no cyanosis, clubbing, or edema.  Right scapula tender , posterior ribs tender, difficulty raising her right arm, distal CSM to all 4 extremities intact b/l Skin: warm and dry with no masses, lesions, or rashes Psych: A&Ox3 with an appropriate affect.   Assessment/Plan: Fall from ladder Right rib fx - pulm toilet/IS, duonebs ordered Sm R pneumothorax/effusion - hold off on CT for now Inferior R scapula fx - sling for comfort, not likely operable, will talk with Trauma ortho to make sure ABL anemia - mild, continue to monitor VTE - SCD's, Lovenox FEN - resume home meds, encourage orals for pain, bowel regimen Dispo -- d/c tomorrow?, transfer to floor   Coralie Keens, PA-C Pager: [place] Pager: [phone]   10/21/2014

## 2014-10-21 NOTE — Plan of Care (Signed)
Problem: Phase I Progression Outcomes Goal: Pain controlled with appropriate interventions Outcome: Progressing Goal: Hemodynamically stable Outcome: Completed/Met Date Met:  10/21/14

## 2014-10-22 ENCOUNTER — Inpatient Hospital Stay (HOSPITAL_COMMUNITY): Payer: Medicare Other

## 2014-10-22 LAB — CBC
HCT: 35.8 % — ABNORMAL LOW (ref 36.0–46.0)
Hemoglobin: 11.4 g/dL — ABNORMAL LOW (ref 12.0–15.0)
MCH: 26.9 pg (ref 26.0–34.0)
MCHC: 31.8 g/dL (ref 30.0–36.0)
MCV: 84.4 fL (ref 78.0–100.0)
Platelets: 151 10*3/uL (ref 150–400)
RBC: 4.24 MIL/uL (ref 3.87–5.11)
RDW: 14.8 % (ref 11.5–15.5)
WBC: 5.8 10*3/uL (ref 4.0–10.5)

## 2014-10-22 MED ORDER — PROMETHAZINE HCL 12.5 MG PO TABS: 12.5000 mg | ORAL_TABLET | Freq: Three times a day (TID) | ORAL | Status: DC | PRN

## 2014-10-22 MED ORDER — TRAMADOL HCL 50 MG PO TABS
50.0000 mg | ORAL_TABLET | Freq: Four times a day (QID) | ORAL | Status: DC | PRN
  Administered 2014-10-22 (×2): 100 mg via ORAL
  Filled 2014-10-22 (×2): qty 2

## 2014-10-22 MED ORDER — HYDROCODONE-ACETAMINOPHEN 5-325 MG PO TABS: 1.0000 | ORAL_TABLET | Freq: Four times a day (QID) | ORAL | Status: DC | PRN

## 2014-10-22 MED ORDER — METHOCARBAMOL 750 MG PO TABS: 750.0000 mg | ORAL_TABLET | Freq: Three times a day (TID) | ORAL | Status: DC | PRN

## 2014-10-22 MED ORDER — POLYETHYLENE GLYCOL 3350 17 G PO PACK: 17.0000 g | PACK | Freq: Every day | ORAL | Status: DC | PRN

## 2014-10-22 MED ORDER — ACETAMINOPHEN 325 MG PO TABS
650.0000 mg | ORAL_TABLET | Freq: Four times a day (QID) | ORAL | Status: DC
  Administered 2014-10-22 (×2): 650 mg via ORAL
  Filled 2014-10-22 (×2): qty 2

## 2014-10-22 MED ORDER — ACETAMINOPHEN 325 MG PO TABS: 650.0000 mg | ORAL_TABLET | Freq: Four times a day (QID) | ORAL | Status: DC

## 2014-10-22 MED ORDER — METHOCARBAMOL 750 MG PO TABS
750.0000 mg | ORAL_TABLET | Freq: Three times a day (TID) | ORAL | Status: DC
  Administered 2014-10-22 (×2): 750 mg via ORAL
  Filled 2014-10-22 (×4): qty 1

## 2014-10-22 MED ORDER — DSS 100 MG PO CAPS: 100.0000 mg | ORAL_CAPSULE | Freq: Two times a day (BID) | ORAL | Status: DC | PRN

## 2014-10-22 MED ORDER — TRAMADOL HCL 50 MG PO TABS: 50.0000 mg | ORAL_TABLET | Freq: Four times a day (QID) | ORAL | Status: DC | PRN

## 2014-10-22 NOTE — Consult Note (Signed)
Orthopaedic Trauma Service (OTS)  Reason for Consult: Right scapular body fracture Referring Physician: Trauma service   HPI: Penny [name] is an 80 y.o. right hand dominant white female who sustained a fall off of a ladder while trying to put cushions up. Patient sustained multiple right rib fractures, Bracamonte pneumothorax and a Dao fracture of the inferior angle of her right scapula. Orthopedic trauma service was consult and regarding her scapula fracture. Patient doing well overall does complain mostly of right-sided chest wall pain due to her rib fractures. She is able to move her right arm without tremendous difficulty. She does have a little nausea this morning but is doing much better overall.  Past Medical History  Diagnosis Date  . Hypertension   . Hyperlipidemia   . Lung cancer, upper lobe 12/04/1996    RUL  . Osteoporosis   . Thigh shingles 12/04/1996  . S/P partial lobectomy of lung     [id number]  . Vitamin D deficiency   . Thoracic aorta atherosclerosis   . COPD (chronic obstructive pulmonary disease) with emphysema     Past Surgical History  Procedure Laterality Date  . Carpal tunnel release Right     2009  . Shoulder adhesion release Right     2004  . Lung removal, partial Right     RUL 1998    Family History  Problem Relation Age of Onset  . Heart disease Mother   . Lung cancer Father     Social History:  reports that she quit smoking about 17 years ago. She has never used smokeless tobacco. She reports that she drinks about 0.6 oz of alcohol per week. She reports that she does not use illicit drugs.  Allergies:  Allergies  Allergen Reactions  . Ace Inhibitors Cough  . Calcium-Containing Compounds Other (See Comments)    constpation  . Lactose Intolerance (Gi) Diarrhea    Medications: I have reviewed the patient's current medications.  Results for orders placed or performed during the hospital encounter of 10/20/14 (from the past 48 hour(s))  CBC  with Differential     Status: Abnormal   Collection Time: 10/20/14  5:58 PM  Result Value Ref Range   WBC 8.2 4.0 - 10.5 K/uL   RBC 4.33 3.87 - 5.11 MIL/uL   Hemoglobin 12.0 12.0 - 15.0 g/dL   HCT 56.5 49.9 - 67.9 %   MCV 84.3 78.0 - 100.0 fL   MCH 27.7 26.0 - 34.0 pg   MCHC 32.9 30.0 - 36.0 g/dL   RDW 80.9 13.8 - 39.1 %   Platelets 144 (L) 150 - 400 K/uL   Neutrophils Relative % 85 (H) 43 - 77 %   Neutro Abs 7.0 1.7 - 7.7 K/uL   Lymphocytes Relative 8 (L) 12 - 46 %   Lymphs Abs 0.6 (L) 0.7 - 4.0 K/uL   Monocytes Relative 6 3 - 12 %   Monocytes Absolute 0.5 0.1 - 1.0 K/uL   Eosinophils Relative 1 0 - 5 %   Eosinophils Absolute 0.0 0.0 - 0.7 K/uL   Basophils Relative 0 0 - 1 %   Basophils Absolute 0.0 0.0 - 0.1 K/uL  I-Stat Chem 8, ED     Status: Abnormal   Collection Time: 10/20/14  6:16 PM  Result Value Ref Range   Sodium 139 137 - 147 mEq/L   Potassium 3.4 (L) 3.7 - 5.3 mEq/L   Chloride 99 96 - 112 mEq/L   BUN 21 6 - 23  mg/dL   Creatinine, Ser 0.80 0.50 - 1.10 mg/dL   Glucose, Bld 149 (H) 70 - 99 mg/dL   Calcium, Ion 1.18 1.13 - 1.30 mmol/L   TCO2 29 0 - 100 mmol/L   Hemoglobin 13.9 12.0 - 15.0 g/dL   HCT 41.0 36.0 - 46.0 %  Occult blood, poc device     Status: Abnormal   Collection Time: 10/20/14  9:33 PM  Result Value Ref Range   Fecal Occult Bld POSITIVE (A) NEGATIVE  MRSA PCR Screening     Status: None   Collection Time: 10/21/14  3:09 AM  Result Value Ref Range   MRSA by PCR NEGATIVE NEGATIVE    Comment:        The GeneXpert MRSA Assay (FDA approved for NASAL specimens only), is one component of a comprehensive MRSA colonization surveillance program. It is not intended to diagnose MRSA infection nor to guide or monitor treatment for MRSA infections.   CBC     Status: Abnormal   Collection Time: 10/21/14  3:18 AM  Result Value Ref Range   WBC 5.7 4.0 - 10.5 K/uL   RBC 3.87 3.87 - 5.11 MIL/uL   Hemoglobin 10.9 (L) 12.0 - 15.0 g/dL    Comment: REPEATED  TO VERIFY   HCT 32.6 (L) 36.0 - 46.0 %   MCV 84.2 78.0 - 100.0 fL   MCH 28.2 26.0 - 34.0 pg   MCHC 33.4 30.0 - 36.0 g/dL   RDW 14.9 11.5 - 15.5 %   Platelets 145 (L) 150 - 400 K/uL  Basic metabolic panel     Status: Abnormal   Collection Time: 10/21/14  3:18 AM  Result Value Ref Range   Sodium 138 137 - 147 mEq/L   Potassium 3.3 (L) 3.7 - 5.3 mEq/L   Chloride 101 96 - 112 mEq/L   CO2 28 19 - 32 mEq/L   Glucose, Bld 112 (H) 70 - 99 mg/dL   BUN 16 6 - 23 mg/dL   Creatinine, Ser 0.67 0.50 - 1.10 mg/dL   Calcium 8.5 8.4 - 10.5 mg/dL   GFR calc non Af Amer 81 (L) >90 mL/min   GFR calc Af Amer >90 >90 mL/min    Comment: (NOTE) The eGFR has been calculated using the CKD EPI equation. This calculation has not been validated in all clinical situations. eGFR's persistently <90 mL/min signify possible Chronic Kidney Disease.    Anion gap 9 5 - 15  CBC     Status: Abnormal   Collection Time: 10/22/14  4:39 AM  Result Value Ref Range   WBC 5.8 4.0 - 10.5 K/uL   RBC 4.24 3.87 - 5.11 MIL/uL   Hemoglobin 11.4 (L) 12.0 - 15.0 g/dL   HCT 35.8 (L) 36.0 - 46.0 %   MCV 84.4 78.0 - 100.0 fL   MCH 26.9 26.0 - 34.0 pg   MCHC 31.8 30.0 - 36.0 g/dL   RDW 14.8 11.5 - 15.5 %   Platelets 151 150 - 400 K/uL    Dg Thoracic Spine 2 View  10/20/2014   CLINICAL DATA:  Thoracic spine pain after fall off ladder.  EXAM: THORACIC SPINE - 2 VIEW  COMPARISON:  None.  FINDINGS: There is no evidence of thoracic spine fracture. Alignment is normal. No other significant bone abnormalities are identified.  IMPRESSION: Normal thoracic spine.   Electronically Signed   By: Sabino Dick M.D.   On: 10/20/2014 17:12   Dg Shoulder Right  10/20/2014  CLINICAL DATA:  [place] from ladder  EXAM: RIGHT SHOULDER - 2+ VIEW  COMPARISON:  None.  FINDINGS: Glenohumeral joint is intact. No evidence of scapular fracture or humeral fracture. The acromioclavicular joint is intact.  IMPRESSION: No fracture or dislocation.    Electronically Signed   By: Suzy Bouchard M.D.   On: 10/20/2014 17:11   Ct Head Wo Contrast  10/20/2014   CLINICAL DATA:  Fall from ladder  EXAM: CT HEAD WITHOUT CONTRAST  CT CERVICAL SPINE WITHOUT CONTRAST  TECHNIQUE: Multidetector CT imaging of the head and cervical spine was performed following the standard protocol without intravenous contrast. Multiplanar CT image reconstructions of the cervical spine were also generated.  COMPARISON:  None.  FINDINGS: CT HEAD FINDINGS  No skull fracture is noted. No intracranial hemorrhage, mass effect or midline shift. Moderate cerebral atrophy. Periventricular and patchy subcortical extensive white matter decreased attenuation probable due to chronic Ell vessel ischemic changes. No acute cortical infarction. No mass lesion is noted on this unenhanced scan.  CT CERVICAL SPINE FINDINGS  Axial images of the cervical spine shows no acute fracture or subluxation.  There is soft tissue air in right paraspinal region and right deep chest wall. Best seen in axial image 57. There is pneumothorax in right apex and right upper lung anteriorly.  Computer processed images shows degenerative changes C1-C2 articulation. Significant disc space flattening noted at C4-C5-C5-C6 and C6-C7 level. There is about 3.5 mm anterolisthesis C3 on C4 vertebral body. No prevertebral soft tissue swelling. Cervical airway is patent.  IMPRESSION: 1. No acute intracranial abnormality. Mild cerebral atrophy. Periventricular and extensive patchy subcortical white matter decreased attenuation probable due to chronic Bogus vessel ischemic changes. 2. No cervical spine acute fracture or subluxation. Degenerative changes as described above. There is deep soft tissue air in right upper paraspinal region and right upper chest wall. There is right pneumothorax.   Electronically Signed   By: Lahoma Crocker M.D.   On: 10/20/2014 16:53   Ct Chest W Contrast  10/20/2014   CLINICAL DATA:  Fall backwards from  ladder with chest pain  EXAM: CT CHEST WITH CONTRAST  TECHNIQUE: Multidetector CT imaging of the chest was performed during intravenous contrast administration.  CONTRAST:  50mL OMNIPAQUE IOHEXOL 300 MG/ML  SOLN  COMPARISON:  Chest x-ray from earlier in the same day.  FINDINGS: The left lung is well aerated without focal infiltrate or sizable effusion. A right-sided pneumothorax is noted of a mild degree most notably in the right lung base. Mild emphysematous changes are seen. A Wahid right-sided pleural effusion is noted. The thoracic inlet is within normal limits. The thoracic AA or shows diffuse calcifications without aneurysmal dilatation. Coronary calcifications are seen. No definitive pulmonary artery is noted centrally. Scanning into the upper abdomen reveals no acute abnormality. Multiple hepatic cysts are seen.  The bony structures show a fracture through the tip of the scapula inferiorly. The thoracic spine demonstrates degenerative changes. No compression deformities are seen. All sternal fracture is noted. The right rib cage shows fractures of the right fourth, fifth and sixth ribs posteriorly. No other definitive rib fractures noted.  IMPRESSION: Right rib fractures with associated pneumothorax predominantly in the base. Mild subcutaneous emphysema is seen. A Auth right effusion is noted. The remainder of the exam shows no acute abnormality. Mild emphysematous changes are seen.  Critical Value/emergent results were called by telephone at the time of interpretation on 10/20/2014 at 7:48 pm to Dr. Davonna Belling , who verbally acknowledged these  results.   Electronically Signed   By: Inez Catalina M.D.   On: 10/20/2014 19:49   Ct Cervical Spine Wo Contrast  10/20/2014   CLINICAL DATA:  Fall from ladder  EXAM: CT HEAD WITHOUT CONTRAST  CT CERVICAL SPINE WITHOUT CONTRAST  TECHNIQUE: Multidetector CT imaging of the head and cervical spine was performed following the standard protocol without  intravenous contrast. Multiplanar CT image reconstructions of the cervical spine were also generated.  COMPARISON:  None.  FINDINGS: CT HEAD FINDINGS  No skull fracture is noted. No intracranial hemorrhage, mass effect or midline shift. Moderate cerebral atrophy. Periventricular and patchy subcortical extensive white matter decreased attenuation probable due to chronic Howells vessel ischemic changes. No acute cortical infarction. No mass lesion is noted on this unenhanced scan.  CT CERVICAL SPINE FINDINGS  Axial images of the cervical spine shows no acute fracture or subluxation.  There is soft tissue air in right paraspinal region and right deep chest wall. Best seen in axial image 57. There is pneumothorax in right apex and right upper lung anteriorly.  Computer processed images shows degenerative changes C1-C2 articulation. Significant disc space flattening noted at C4-C5-C5-C6 and C6-C7 level. There is about 3.5 mm anterolisthesis C3 on C4 vertebral body. No prevertebral soft tissue swelling. Cervical airway is patent.  IMPRESSION: 1. No acute intracranial abnormality. Mild cerebral atrophy. Periventricular and extensive patchy subcortical white matter decreased attenuation probable due to chronic Stempel vessel ischemic changes. 2. No cervical spine acute fracture or subluxation. Degenerative changes as described above. There is deep soft tissue air in right upper paraspinal region and right upper chest wall. There is right pneumothorax.   Electronically Signed   By: Lahoma Crocker M.D.   On: 10/20/2014 16:53   Dg Chest Port 1 View  10/22/2014   CLINICAL DATA:  80 year old female status post fall off ladder onto concrete with right rib fractures and pneumothorax. Initial encounter.  EXAM: PORTABLE CHEST - 1 VIEW  COMPARISON:  10/21/2014 and earlier.  FINDINGS: Portable AP semi upright view at 0504 hrs. Persistent right pneumothorax, but less apparent. Pleural edge is still visible at the right lung base. Stable  lung volumes. Stable cardiac size and mediastinal contours. Visualized tracheal air column is within normal limits. decreased right supraclavicular subcutaneous gas. Stable left lung, with suggestion of Ahn pleural effusion.  IMPRESSION: 1. Right pneumothorax has mildly decreased. 2. Otherwise stable chest.   Electronically Signed   By: Lars Pinks M.D.   On: 10/22/2014 07:28   Dg Chest Port 1 View  10/21/2014   CLINICAL DATA:  81 year old female with right rib fractures and pneumothorax after fall from ladder. Initial encounter.  EXAM: PORTABLE CHEST - 1 VIEW  COMPARISON:  Chest CT and radiographs 10/20/2014 and earlier.  FINDINGS: Portable AP view at 0613 hrs. Nuzzo to moderate right pneumothorax, with pleural edge visible along the lateral lung and at the costophrenic angle. This is increased from the portable radiograph at 1728 hr yesterday. Stable cardiac size and mediastinal contours. No pulmonary edema. No definite pleural effusion. No consolidation or confluent pulmonary opacity. Right supraclavicular subcutaneous gas is stable.  IMPRESSION: Reisz to moderate right side pneumothorax appears increased compared to the portable film from 1728 hrs yesterday, but probably is stable compared to the subsequent chest CT at 1927 hrs.   Electronically Signed   By: Lars Pinks M.D.   On: 10/21/2014 07:19   Dg Chest Port 1 View  10/20/2014   CLINICAL DATA:  Fall from  ladder with right-sided chest and shoulder pain.  EXAM: PORTABLE CHEST - 1 VIEW  COMPARISON:  None.  FINDINGS: Trachea is midline. Heart size normal. Thoracic aorta is calcified and somewhat uncoiled. Minimal bibasilar atelectasis. No definite pneumothorax. Postoperative changes are seen in the right hilum. No pleural fluid.  There are acute appearing fractures of the right fourth, fifth and sixth posterior ribs. Right clavicle appears intact. Right scapula is poorly evaluated.  IMPRESSION: 1. Nondisplaced fractures of the right fourth, fifth and  sixth posterior ribs. 2. Right scapula is poorly assessed on this single AP view. 3. No definite pneumothorax. 4. Mild bibasilar atelectasis, right greater than left.   Electronically Signed   By: Lorin Picket M.D.   On: 10/20/2014 18:07    Review of Systems  Constitutional: Negative for fever and chills.  Respiratory: Negative for shortness of breath and wheezing.   Cardiovascular:       Chest wall pain from right-sided rib fractures  Gastrointestinal: Positive for nausea. Negative for abdominal pain.  Musculoskeletal:       Right posterior shoulder pain  Neurological: Negative for tingling, sensory change and headaches.   Blood pressure 152/73, pulse 77, temperature 97.9 F (36.6 C), temperature source Oral, resp. rate 18, height $RemoveBe'5\' 3"'bvwJnECWT$  (1.6 m), weight 60.8 kg (134 lb 0.6 oz), SpO2 94 %. Physical Exam  Constitutional: She appears well-developed. She is cooperative. No distress.  Musculoskeletal:  Right upper extremity Inspection:   No gross deformities   No open wounds or lesions Bony eval:   Acromioclavicular and sternoclavicular joints are unremarkable   Clavicle nontender   Proximal humerus humeral shaft elbow forearm wrist and hand are nontender   Mild tenderness palpation over her scapula   No crepitus or gross motion appreciated with manipulation of her right upper extremity Soft tissue:   No significant swelling   Shoulder, elbow, wrist and hand are stable ROM:   Excellent passive and active range of motion of her right shoulder minimal pain with motion. No significant disruption of her scapulohumeral rhythm is noted   Sensation:   Radial, ulnar, median, axillary nerve sensory functions intact Motor:   Radial, ulnar, median, axillary nerve motor functions intact Vascular:   Extremity is warm   Palpable radial pulse   Neurological: She is alert.  Psychiatric: She has a normal mood and affect. Her speech is normal and behavior is normal. Thought content normal.  Cognition and memory are normal.    Assessment/Plan:  80 year old right-hand-dominant white female status post fall off of a ladder  1. Fall off ladder  2. Right inferior angle scapular body fracture  Nonoperative  Symptomatic care  Weight-bear as tolerated, range of motion as tolerated, no lifting restrictions  Sling for comfort only, prefer patient to be out of sling as much as possible  Ice as needed  Follow-up in 4 weeks  3. Disposition  Orthopedic issues stable  Continue per TS   Jari Pigg, PA-C [facility] [phone] (P) 10/22/2014, 9:38 AM

## 2014-10-22 NOTE — Progress Notes (Signed)
Patient c/o indigestion and nausea. Medicated earlier with Zofran for c/o nausea. Good relief. Not time for repeat dose. M.D. Paged.

## 2014-10-22 NOTE — Progress Notes (Signed)
[facility] Trauma [facility] Discharge Summary   Patient ID: Penny [name] MRN: [id number] DOB/AGE: Nov 27, 1933 80 y.o.  Admit date: 10/20/2014 Discharge date: 10/22/2014  Discharge Diagnoses Patient Active Problem List   Diagnosis Date Noted  . Multiple rib fractures 10/20/2014  . PreDiabetes 04/15/2014  . Encounter for long-term (current) use of other medications 04/15/2014  . Hypertension   . Hyperlipidemia   . Vitamin D deficiency   . Lung cancer, upper lobe 12/04/1996    Consultants Dr. Marcelino Scot (Orthopedics)  Procedures None  Hospital Course:  80 year old female who fell off a ladder in her garage. She had her head on an object nearby. There was no loss of consciousness. She arrived complaining of right chest and back pain. A Merry laceration on her posterior scalp was repaired in the emergency department by the staff there. During her workup, she was found to have right rib fractures, Soltys right scapula tip fracture, and a Whan right pneumothorax. She denies neck pain or abdominal pain.  Patient was admitted and was transferred to the floor for close monitoring of her pneumothorax and pain control.  Follow up xrays showed be stable and improving pneumothorax.  Diet was advanced as tolerated.  She did experience some nausea which was improved by switching her to Ultram and robaxin instead of Vicodin.  Orthopedics evaluated the patients scapula fracture and recommended a sling for comfort, ROM as tolerated and OP follow up.  Physical therapy worked with the patient and recommended home with intermittent supervision.  No HH equipment or PT needed.  Her nausea is much improved.  On HD #3, the patient was voiding well, tolerating diet, ambulating well, pain well controlled, vital signs stable, incisions c/d/i and felt stable for discharge home.  Patient will follow up in our office in 2 weeks for staple removal and recheck of her ribs and knows to call with questions or  concerns.  She should follow up with Dr. Marcelino Scot in 4 weeks for a recheck on her scapula.      Medication List    TAKE these medications        acetaminophen 325 MG tablet  Commonly known as:  TYLENOL  Take 2 tablets (650 mg total) by mouth every 6 (six) hours.     ALPRAZolam 0.5 MG tablet  Commonly known as:  XANAX  Take 1 tablet (0.5 mg total) by mouth 3 (three) times daily as needed for sleep or anxiety.     aspirin 81 MG chewable tablet  Chew 81 mg by mouth daily.     azelastine 0.1 % nasal spray  Commonly known as:  ASTELIN  Use i1 to 2 sprays each nostril once or twice day     Biotin 10 MG Tabs  Take 10 mg by mouth.     cholecalciferol 1000 UNITS tablet  Commonly known as:  VITAMIN D  Take 10,000 Units by mouth 4 (four) times a week.     VITAMIN D PO  Take 1-2 tablets by mouth See admin instructions. Vitamin d 5000 units per patient. Take twice every day except on Monday, Wednesday, and Fridays take 1 tablet     diphenhydrAMINE 25 MG tablet  Commonly known as:  SOMINEX  Take 25 mg by mouth 2 (two) times daily. Take one in the morning and another one every evening per patient     diphenhydramine-acetaminophen 25-500 MG Tabs  Commonly known as:  TYLENOL PM  Take 1 tablet by mouth at bedtime. Take every  night per patient     DSS 100 MG Caps  Take 100 mg by mouth 2 (two) times daily as needed for mild constipation.     hydrochlorothiazide 25 MG tablet  Commonly known as:  HYDRODIURIL  TAKE 1 TABLET BY MOUTH EVERY DAY     HYDROcodone-acetaminophen 5-325 MG per tablet  Commonly known as:  NORCO/VICODIN  Take 1-2 tablets by mouth every 6 (six) hours as needed for moderate pain or severe pain (mild pain).     loperamide 2 MG capsule  Commonly known as:  IMODIUM  Take 2 mg by mouth 2 (two) times daily. [employer] brand anti-diarrhea. Take every day per patient     magnesium gluconate 500 MG tablet  Commonly known as:  MAGONATE  Take 500 mg by mouth daily.      methocarbamol 750 MG tablet  Commonly known as:  ROBAXIN  Take 1 tablet (750 mg total) by mouth every 8 (eight) hours as needed for muscle spasms.     metoprolol 50 MG tablet  Commonly known as:  LOPRESSOR  TAKE 1 TABLET BY MOUTH TWICE DAILY     polyethylene glycol packet  Commonly known as:  MIRALAX / GLYCOLAX  Take 17 g by mouth daily as needed for mild constipation or moderate constipation.     promethazine 12.5 MG tablet  Commonly known as:  PHENERGAN  Take 1 tablet (12.5 mg total) by mouth every 8 (eight) hours as needed for nausea or vomiting.     simvastatin 40 MG tablet  Commonly known as:  ZOCOR  TAKE ONE TABLET BY MOUTH EVERY NIGHT AT BEDTIME FOR CHOLESTEROL     traMADol 50 MG tablet  Commonly known as:  ULTRAM  Take 1-2 tablets (50-100 mg total) by mouth every 6 (six) hours as needed for moderate pain or severe pain.     zolpidem 5 MG tablet  Commonly known as:  AMBIEN  Take 1 tablet (5 mg total) by mouth at bedtime as needed for sleep.         Follow-up Information    Follow up with MCKEOWN,WILLIAM DAVID, MD. Schedule an appointment as soon as possible for a visit in 2 weeks.   Specialty:  Internal Medicine   Why:  For post-hospital follow up   Contact information:   [address] [place] [address] [phone]       Follow up with [facility]. Go on 11/04/2014.   Why:  For staple removal and recheck on your ribs, For post-hospital follow up at 2:00pm, please arrive 30 minutes early to fill out paperwork and check in   Contact information:   [place] [id number] [phone]      Follow up with Rozanna Box, MD. Schedule an appointment as soon as possible for a visit in 4 weeks.   Specialty:  Orthopedic Surgery   Contact information:   [place] [address] [phone]       Signed: Nehemiah Massed. [place], [facility] Surgery  Trauma  Service [phone]  10/22/2014, 2:45 PM

## 2014-10-22 NOTE — Progress Notes (Signed)
[facility] Trauma Service  Progress Note   LOS: 2 days   Subjective: Pt feels a bit nauseated.  No vomiting.  Thinks it may be from moving too quickly out of bed or because of the pain medication.  Not very hungry, but does tolerate PO when she tries.  Ambulating well OOB.  Pain well controlled.  IS to 750.  No BM yet.  +flatus.  Objective: Vital signs in last 24 hours: Temp:  [97.7 F (36.5 C)-98.3 F (36.8 C)] 97.7 F (36.5 C) (11/19 0454) Pulse Rate:  [70-80] 76 (11/19 0454) Resp:  [16] 16 (11/19 0454) BP: (129-154)/(65-75) 148/65 mmHg (11/19 0454) SpO2:  [91 %-96 %] 95 % (11/19 0454) Last BM Date: 10/20/14  Lab Results:  CBC  Recent Labs  10/21/14 0318 10/22/14 0439  WBC 5.7 5.8  HGB 10.9* 11.4*  HCT 32.6* 35.8*  PLT 145* 151   BMET  Recent Labs  10/20/14 1816 10/21/14 0318  NA 139 138  K 3.4* 3.3*  CL 99 101  CO2  --  28  GLUCOSE 149* 112*  BUN 21 16  CREATININE 0.80 0.67  CALCIUM  --  8.5    Imaging: Dg Thoracic Spine 2 View  10/20/2014   CLINICAL DATA:  Thoracic spine pain after fall off ladder.  EXAM: THORACIC SPINE - 2 VIEW  COMPARISON:  None.  FINDINGS: There is no evidence of thoracic spine fracture. Alignment is normal. No other significant bone abnormalities are identified.  IMPRESSION: Normal thoracic spine.   Electronically Signed   By: Sabino Dick M.D.   On: 10/20/2014 17:12   Dg Shoulder Right  10/20/2014   CLINICAL DATA:  [place] from ladder  EXAM: RIGHT SHOULDER - 2+ VIEW  COMPARISON:  None.  FINDINGS: Glenohumeral joint is intact. No evidence of scapular fracture or humeral fracture. The acromioclavicular joint is intact.  IMPRESSION: No fracture or dislocation.   Electronically Signed   By: Suzy Bouchard M.D.   On: 10/20/2014 17:11   Ct Head Wo Contrast  10/20/2014   CLINICAL DATA:  Fall from ladder  EXAM: CT HEAD WITHOUT CONTRAST  CT CERVICAL SPINE WITHOUT CONTRAST  TECHNIQUE: Multidetector CT imaging of the head and  cervical spine was performed following the standard protocol without intravenous contrast. Multiplanar CT image reconstructions of the cervical spine were also generated.  COMPARISON:  None.  FINDINGS: CT HEAD FINDINGS  No skull fracture is noted. No intracranial hemorrhage, mass effect or midline shift. Moderate cerebral atrophy. Periventricular and patchy subcortical extensive white matter decreased attenuation probable due to chronic [place] vessel ischemic changes. No acute cortical infarction. No mass lesion is noted on this unenhanced scan.  CT CERVICAL SPINE FINDINGS  Axial images of the cervical spine shows no acute fracture or subluxation.  There is soft tissue air in right paraspinal region and right deep chest wall. Best seen in axial image 57. There is pneumothorax in right apex and right upper lung anteriorly.  Computer processed images shows degenerative changes C1-C2 articulation. Significant disc space flattening noted at C4-C5-C5-C6 and C6-C7 level. There is about 3.5 mm anterolisthesis C3 on C4 vertebral body. No prevertebral soft tissue swelling. Cervical airway is patent.  IMPRESSION: 1. No acute intracranial abnormality. Mild cerebral atrophy. Periventricular and extensive patchy subcortical white matter decreased attenuation probable due to chronic Shoff vessel ischemic changes. 2. No cervical spine acute fracture or subluxation. Degenerative changes as described above. There is deep soft tissue air in right upper paraspinal region  and right upper chest wall. There is right pneumothorax.   Electronically Signed   By: Lahoma Crocker M.D.   On: 10/20/2014 16:53   Ct Chest W Contrast  10/20/2014   CLINICAL DATA:  Fall backwards from ladder with chest pain  EXAM: CT CHEST WITH CONTRAST  TECHNIQUE: Multidetector CT imaging of the chest was performed during intravenous contrast administration.  CONTRAST:  70mL OMNIPAQUE IOHEXOL 300 MG/ML  SOLN  COMPARISON:  Chest x-ray from earlier in the same day.   FINDINGS: The left lung is well aerated without focal infiltrate or sizable effusion. A right-sided pneumothorax is noted of a mild degree most notably in the right lung base. Mild emphysematous changes are seen. A Sarno right-sided pleural effusion is noted. The thoracic inlet is within normal limits. The thoracic AA or shows diffuse calcifications without aneurysmal dilatation. Coronary calcifications are seen. No definitive pulmonary artery is noted centrally. Scanning into the upper abdomen reveals no acute abnormality. Multiple hepatic cysts are seen.  The bony structures show a fracture through the tip of the scapula inferiorly. The thoracic spine demonstrates degenerative changes. No compression deformities are seen. All sternal fracture is noted. The right rib cage shows fractures of the right fourth, fifth and sixth ribs posteriorly. No other definitive rib fractures noted.  IMPRESSION: Right rib fractures with associated pneumothorax predominantly in the base. Mild subcutaneous emphysema is seen. A Bon right effusion is noted. The remainder of the exam shows no acute abnormality. Mild emphysematous changes are seen.  Critical Value/emergent results were called by telephone at the time of interpretation on 10/20/2014 at 7:48 pm to Dr. Davonna Belling , who verbally acknowledged these results.   Electronically Signed   By: Inez Catalina M.D.   On: 10/20/2014 19:49   Ct Cervical Spine Wo Contrast  10/20/2014   CLINICAL DATA:  Fall from ladder  EXAM: CT HEAD WITHOUT CONTRAST  CT CERVICAL SPINE WITHOUT CONTRAST  TECHNIQUE: Multidetector CT imaging of the head and cervical spine was performed following the standard protocol without intravenous contrast. Multiplanar CT image reconstructions of the cervical spine were also generated.  COMPARISON:  None.  FINDINGS: CT HEAD FINDINGS  No skull fracture is noted. No intracranial hemorrhage, mass effect or midline shift. Moderate cerebral atrophy. Periventricular  and patchy subcortical extensive white matter decreased attenuation probable due to chronic [place] vessel ischemic changes. No acute cortical infarction. No mass lesion is noted on this unenhanced scan.  CT CERVICAL SPINE FINDINGS  Axial images of the cervical spine shows no acute fracture or subluxation.  There is soft tissue air in right paraspinal region and right deep chest wall. Best seen in axial image 57. There is pneumothorax in right apex and right upper lung anteriorly.  Computer processed images shows degenerative changes C1-C2 articulation. Significant disc space flattening noted at C4-C5-C5-C6 and C6-C7 level. There is about 3.5 mm anterolisthesis C3 on C4 vertebral body. No prevertebral soft tissue swelling. Cervical airway is patent.  IMPRESSION: 1. No acute intracranial abnormality. Mild cerebral atrophy. Periventricular and extensive patchy subcortical white matter decreased attenuation probable due to chronic [place] vessel ischemic changes. 2. No cervical spine acute fracture or subluxation. Degenerative changes as described above. There is deep soft tissue air in right upper paraspinal region and right upper chest wall. There is right pneumothorax.   Electronically Signed   By: Lahoma Crocker M.D.   On: 10/20/2014 16:53   Dg Chest Port 1 View  10/22/2014   CLINICAL DATA:  80 year old  female status post fall off ladder onto concrete with right rib fractures and pneumothorax. Initial encounter.  EXAM: PORTABLE CHEST - 1 VIEW  COMPARISON:  10/21/2014 and earlier.  FINDINGS: Portable AP semi upright view at 0504 hrs. Persistent right pneumothorax, but less apparent. Pleural edge is still visible at the right lung base. Stable lung volumes. Stable cardiac size and mediastinal contours. Visualized tracheal air column is within normal limits. decreased right supraclavicular subcutaneous gas. Stable left lung, with suggestion of Lanting pleural effusion.  IMPRESSION: 1. Right pneumothorax has mildly  decreased. 2. Otherwise stable chest.   Electronically Signed   By: Lars Pinks M.D.   On: 10/22/2014 07:28   Dg Chest Port 1 View  10/21/2014   CLINICAL DATA:  80 year old female with right rib fractures and pneumothorax after fall from ladder. Initial encounter.  EXAM: PORTABLE CHEST - 1 VIEW  COMPARISON:  Chest CT and radiographs 10/20/2014 and earlier.  FINDINGS: Portable AP view at 0613 hrs. [place] to moderate right pneumothorax, with pleural edge visible along the lateral lung and at the costophrenic angle. This is increased from the portable radiograph at 1728 hr yesterday. Stable cardiac size and mediastinal contours. No pulmonary edema. No definite pleural effusion. No consolidation or confluent pulmonary opacity. Right supraclavicular subcutaneous gas is stable.  IMPRESSION: [place] to moderate right side pneumothorax appears increased compared to the portable film from 1728 hrs yesterday, but probably is stable compared to the subsequent chest CT at 1927 hrs.   Electronically Signed   By: Lars Pinks M.D.   On: 10/21/2014 07:19   Dg Chest Port 1 View  10/20/2014   CLINICAL DATA:  Fall from ladder with right-sided chest and shoulder pain.  EXAM: PORTABLE CHEST - 1 VIEW  COMPARISON:  None.  FINDINGS: Trachea is midline. Heart size normal. Thoracic aorta is calcified and somewhat uncoiled. Minimal bibasilar atelectasis. No definite pneumothorax. Postoperative changes are seen in the right hilum. No pleural fluid.  There are acute appearing fractures of the right fourth, fifth and sixth posterior ribs. Right clavicle appears intact. Right scapula is poorly evaluated.  IMPRESSION: 1. Nondisplaced fractures of the right fourth, fifth and sixth posterior ribs. 2. Right scapula is poorly assessed on this single AP view. 3. No definite pneumothorax. 4. Mild bibasilar atelectasis, right greater than left.   Electronically Signed   By: Lorin Picket M.D.   On: 10/20/2014 18:07     Physical Exam: General:  pleasant, WD/WN white female who is laying in bed in NAD HEENT: head is normocephalic, laceration to posterior head with 4 staples in place. Sclera are noninjected. PERRL. Ears and nose without any masses or lesions. Mouth is pink but dry. Heart: regular, rate, and rhythm. Normal s1,s2. No obvious murmurs, gallops, or rubs noted. Palpable radial and pedal pulses bilaterally Lungs: CTAB, no wheezes, rhonchi, or rales noted. Respiratory effort nonlabored. Chest wall tenderness to posterior ribs on right. IS to 750. Abd: soft, NT/ND, +BS, no masses, hernias, or organomegaly MS: all 4 extremities are symmetrical with no cyanosis, clubbing, or edema. Right scapula tender, posterior ribs tender, difficulty raising her right arm, distal CSM to all 4 extremities intact b/l Skin: warm and dry with no masses, lesions, or rashes Psych: A&Ox3 with an appropriate affect.   Assessment/Plan: Fall from ladder Right rib fx - pulm toilet/IS, duonebs ordered Sm R pneumothorax/effusion - No CT needed Inferior R scapula fx - sling for comfort, not operable, ROM as tolerated, Dr. Marcelino Scot to eval today ABL  anemia - improved VTE - SCD's, Lovenox FEN - home meds, encourage orals for pain, bowel regimen Dispo -- d/c today likely, ? Need for HH or OP PT, does have son and daughter locally to help her, but they both work   Coralie Keens, [place] Pager: [place] Pager: [phone]   10/22/2014

## 2014-10-22 NOTE — Evaluation (Signed)
Physical Therapy Evaluation Patient Details Name: Penny [name] MRN: [id number] DOB: Jun 04, 1933 Today's Date: 10/22/2014   History of Present Illness  Adm 10/20/14 after fall from ladder with Rt rib fractures, Jackson pneumothorax, Rt inferior angle of scapula fracture PMHx- HTN, lung Ca,, osteoporosis, COPD  Clinical Impression  Patient evaluated by Physical Therapy with no further acute PT needs identified. All education has been completed and the patient has no further questions.  See below for any follow-up Physial Therapy or equipment needs. PT is signing off. Thank you for this referral.     Follow Up Recommendations No PT follow up    Equipment Recommendations  None recommended by PT    Recommendations for Other Services       Precautions / Restrictions Precautions Precautions: None Restrictions Weight Bearing Restrictions: No      Mobility  Bed Mobility Overal bed mobility: Needs Assistance Bed Mobility: Rolling;Sidelying to Sit;Sit to Sidelying Rolling: Supervision Sidelying to sit: Supervision     Sit to sidelying: Supervision General bed mobility comments: incr effort with HOB 0 and no rail;   Transfers Overall transfer level: Independent Equipment used: None                Ambulation/Gait Ambulation/Gait assistance: Supervision;Independent Ambulation Distance (Feet): 250 Feet Assistive device: None Gait Pattern/deviations: WFL(Within Functional Limits) Gait velocity: sl decr, however able to incr to WNL with cues Gait velocity interpretation: at or above normal speed for age/gender General Gait Details: no drifting or LOB with head turns, turns, sudden stop  Stairs Stairs: Yes Stairs assistance: Min guard Stair Management: No rails;Step to pattern;Forwards Number of Stairs: 3 General stair comments: hesitant, however did well  Wheelchair Mobility    Modified Rankin (Stroke Patients Only)       Balance Overall balance assessment:  Independent                   Tandem Stance - Right Leg: 10 (then steps to her Rt)   Rhomberg - Eyes Opened: 30 Rhomberg - Eyes Closed: 10 High level balance activites: Backward walking;Direction changes;Turns;Sudden stops;Head turns               Pertinent Vitals/Pain Pain Assessment: 0-10 Pain Score: 3  Pain Location: Rt scapula Pain Intervention(s): Limited activity within patient's tolerance;Monitored during session;Repositioned (used sling when up)    Home Living Family/patient expects to be discharged to:: Private residence Living Arrangements: Children Available Help at Discharge: Family;Available 24 hours/day (daughter plans not to return to work until pt can be alone) Type of Home: House Home Access: Stairs to enter Entrance Stairs-Rails: None Entrance Stairs-Number of Steps: 2 Home Layout: Two level;Able to live on main level with bedroom/bathroom Home Equipment: Kasandra Knudsen - single point;Shower seat - built in      Prior Function Level of Independence: Independent         Comments: denies h/o balance deficits; reports ladder "was wobbly" and she fell     Hand Dominance   Dominant Hand: Right    Extremity/Trunk Assessment   Upper Extremity Assessment: RUE deficits/detail RUE Deficits / Details: AROM shoulder flexion 45, elbow, wrist, hand WFL         Lower Extremity Assessment: Overall WFL for tasks assessed      Cervical / Trunk Assessment: Normal  Communication   Communication: No difficulties  Cognition Arousal/Alertness: Awake/alert Behavior During Therapy: WFL for tasks assessed/performed Overall Cognitive Status: Within Functional Limits for tasks assessed  General Comments General comments (skin integrity, edema, etc.): Daughter present throughout.     Exercises Other Exercises Other Exercises: Educated on use of RUE (including shoulder flexion and abdct to avoid adhesive capsulitis; educated to  perform sitting or standing without leaning against her scapula. Other Exercises: Discussed proper fit  and use of RUE sling.      Assessment/Plan    PT Assessment Patent does not need any further PT services  PT Diagnosis Acute pain;Difficulty walking   PT Problem List    PT Treatment Interventions     PT Goals (Current goals can be found in the Care Plan section) Acute Rehab PT Goals Patient Stated Goal: Go home and regain independence PT Goal Formulation: All assessment and education complete, DC therapy    Frequency     Barriers to discharge        Co-evaluation               End of Session Equipment Utilized During Treatment: Gait belt (RUE sling) Activity Tolerance: Patient tolerated treatment well Patient left: in bed;with call bell/phone within reach;with family/visitor present Nurse Communication: Mobility status         Time: 6168-3729 PT Time Calculation (min) (ACUTE ONLY): 31 min   Charges:   PT Evaluation $Initial PT Evaluation Tier I: 1 Procedure PT Treatments $Gait Training: 8-22 mins   PT G Codes:          Meaghan Whistler October 28, 2014, 2:53 PM Pager [phone]

## 2014-10-22 NOTE — Progress Notes (Signed)
Medicare IM (Important Message) delivered to patient today by me in anticipation of discharge.   Sandi Mariscal, RN BSN MHA CCM  Case Manager, Trauma Service/Unit [facility] [phone]

## 2014-10-22 NOTE — Discharge Instructions (Signed)
Scapular Fracture You have a fracture (break in bone) of your scapula. This is your shoulder blade. It is the large flat bone behind your shoulder. This is also the bone that makes up the ball and socket joint of your shoulder. Most of the time surgery is not required for injuries to this bone unless the socket of the shoulder joint is involved. DIAGNOSIS  Because of the severity of force usually required to break this bone, x-rays are often taken of other bones likely to be injured at the same time. X-rays of the hip, knee, and pelvis may be taken. Specialized x-rays (arteriograms) may be needed if there are injuries to large blood vessels associated with this injury. HOME CARE INSTRUCTIONS   Simple fractures of the scapula can be treated with a sling and swathe type of immobilization. This means the involved area is held in place by putting the arm in a sling. A wrap is made around the upper arm with the sling holding the arm next to the chest. This may be removed for bathing as instructed by your caregiver.  Apply ice to the injury for 15-20 minutes 03-04 times per day. Put the ice in a plastic bag. Place a towel between the bag of ice and your skin, splint, or immobilization device.  Do not resume use until instructed by your caregiver. Usually full rehabilitation (exercises to improve the injury site) will begin sometime after the sling and swathe are removed. Then begin use gradually as directed. Do not increase use to the point of pain. If pain develops, decrease use and continue the above measures. Slowly increase activities that do not cause discomfort until you gradually achieve normal use without pain.  Only take over-the-counter or prescription medicines for pain, discomfort, or fever as directed by your caregiver. SEEK IMMEDIATE MEDICAL CARE IF:   Your pain and swelling increase and is uncontrolled with medications.  You develop new, unexplained symptoms or an increase of the symptoms  which brought you to your caregiver.  You develop shortness or breath or cough up blood.  You are unable to move your arm or fingers. You develop warmth and swelling in your affected arm.  You develop an unexplained temperature. Document Released: 11/20/2005 Document Revised: 02/12/2012 Document Reviewed: 10/12/2006 [facility] Patient Information 2015 [place], [place]. This information is not intended to replace advice given to you by your health care provider. Make sure you discuss any questions you have with your health care provider. Rib Fracture A rib fracture is a break or crack in one of the bones of the ribs. The ribs are a group of long, curved bones that wrap around your chest and attach to your spine. They protect your lungs and other organs in the chest cavity. A broken or cracked rib is often painful, but most do not cause other problems. Most rib fractures heal on their own over time. However, rib fractures can be more serious if multiple ribs are broken or if broken ribs move out of place and push against other structures. CAUSES   A direct blow to the chest. For example, this could happen during contact sports, a car accident, or a fall against a hard object.  Repetitive movements with high force, such as pitching a baseball or having severe coughing spells. SYMPTOMS   Pain when you breathe in or cough.  Pain when someone presses on the injured area. DIAGNOSIS  Your caregiver will perform a physical exam. Various imaging tests may be ordered to confirm  the diagnosis and to look for related injuries. These tests may include a chest X-ray, computed tomography (CT), magnetic resonance imaging (MRI), or a bone scan. TREATMENT  Rib fractures usually heal on their own in 1-3 months. The longer healing period is often associated with a continued cough or other aggravating activities. During the healing period, pain control is very important. Medication is usually given to control pain.  Hospitalization or surgery may be needed for more severe injuries, such as those in which multiple ribs are broken or the ribs have moved out of place.  HOME CARE INSTRUCTIONS   Avoid strenuous activity and any activities or movements that cause pain. Be careful during activities and avoid bumping the injured rib.  Gradually increase activity as directed by your caregiver.  Only take over-the-counter or prescription medications as directed by your caregiver. Do not take other medications without asking your caregiver first.  Apply ice to the injured area for the first 1-2 days after you have been treated or as directed by your caregiver. Applying ice helps to reduce inflammation and pain.  Put ice in a plastic bag.  Place a towel between your skin and the bag.   Leave the ice on for 15-20 minutes at a time, every 2 hours while you are awake.  Perform deep breathing as directed by your caregiver. This will help prevent pneumonia, which is a common complication of a broken rib. Your caregiver may instruct you to:  Take deep breaths several times a day.  Try to cough several times a day, holding a pillow against the injured area.  Use a device called an incentive spirometer to practice deep breathing several times a day.  Drink enough fluids to keep your urine clear or pale yellow. This will help you avoid constipation.   Do not wear a rib belt or binder. These restrict breathing, which can lead to pneumonia.  SEEK IMMEDIATE MEDICAL CARE IF:   You have a fever.   You have difficulty breathing or shortness of breath.   You develop a continual cough, or you cough up thick or bloody sputum.  You feel sick to your stomach (nausea), throw up (vomit), or have abdominal pain.   You have worsening pain not controlled with medications.  MAKE SURE YOU:  Understand these instructions.  Will watch your condition.  Will get help right away if you are not doing well or get  worse. Document Released: 11/20/2005 Document Revised: 07/23/2013 Document Reviewed: 01/22/2013 [facility] [place] Patient Information 2015 [place], [place]. This information is not intended to replace advice given to you by your health care provider. Make sure you discuss any questions you have with your health care provider.   Orthopaedic DC instructions  Weight-bear as tolerated, range of motion as tolerated, no lifting restrictions  Sling for comfort only, prefer you to be out of sling as much as possible

## 2014-10-22 NOTE — Plan of Care (Signed)
Problem: Phase II Progression Outcomes Goal: Progress activity as tolerated unless otherwise ordered Outcome: Progressing     

## 2014-10-22 NOTE — Progress Notes (Signed)
Patient transferred to [place]. S/P fall from ladder. Alert and oriented. RUE with sling. Oriented to room. Call bell in reach. Family visiting.

## 2014-10-22 NOTE — Plan of Care (Signed)
Problem: Discharge Progression Outcomes Goal: Activity appropriate for discharge plan Outcome: Completed/Met Date Met:  10/22/14

## 2014-10-23 NOTE — Discharge Summary (Signed)
Created as a progress note yesterday, now resubmitting as discharge summary note type.  Attestation signed by Georganna Skeans, MD at 10/22/2014 2:49 PM  Georganna Skeans, MD, MPH, FACS Trauma: [phone] General Surgery: [facility] Discharge Summary   Patient ID: Penny [name] MRN: [id number] DOB/AGE: 1933/05/04 80 y.o.  Admit date: 10/20/2014 Discharge date: 10/22/2014  Discharge Diagnoses Patient Active Problem List   Diagnosis Date Noted  . Multiple rib fractures 10/20/2014  . PreDiabetes 04/15/2014  . Encounter for long-term (current) use of other medications 04/15/2014  . Hypertension   . Hyperlipidemia   . Vitamin D deficiency   . Lung cancer, upper lobe 12/04/1996    Consultants Dr. Marcelino Scot (Orthopedics)  Procedures None  Hospital Course:  80 year old female who fell off a ladder in her garage. She had her head on an object nearby. There was no loss of consciousness. She arrived complaining of right chest and back pain. A [place] laceration on her posterior scalp was repaired in the emergency department by the staff there. During her workup, she was found to have right rib fractures, [place] right scapula tip fracture, and a Newman right pneumothorax. She denies neck pain or abdominal pain.  Patient was admitted and was transferred to the floor for close monitoring of her pneumothorax and pain control. Follow up xrays showed be stable and improving pneumothorax. Diet was advanced as tolerated. She did experience some nausea which was improved by switching her to Ultram and robaxin instead of Vicodin. Orthopedics evaluated the patients scapula fracture and recommended a sling for comfort, ROM as tolerated and OP follow up. Physical therapy worked with the patient and recommended home with intermittent supervision. No HH equipment or PT needed. Her nausea is much improved. On  HD #3, the patient was voiding well, tolerating diet, ambulating well, pain well controlled, vital signs stable, incisions c/d/i and felt stable for discharge home. Patient will follow up in our office in 2 weeks for staple removal and recheck of her ribs and knows to call with questions or concerns. She should follow up with Dr. Marcelino Scot in 4 weeks for a recheck on her scapula.      Medication List    TAKE these medications       acetaminophen 325 MG tablet  Commonly known as: TYLENOL  Take 2 tablets (650 mg total) by mouth every 6 (six) hours.     ALPRAZolam 0.5 MG tablet  Commonly known as: XANAX  Take 1 tablet (0.5 mg total) by mouth 3 (three) times daily as needed for sleep or anxiety.     aspirin 81 MG chewable tablet  Chew 81 mg by mouth daily.     azelastine 0.1 % nasal spray  Commonly known as: ASTELIN  Use i1 to 2 sprays each nostril once or twice day     Biotin 10 MG Tabs  Take 10 mg by mouth.     cholecalciferol 1000 UNITS tablet  Commonly known as: VITAMIN D  Take 10,000 Units by mouth 4 (four) times a week.     VITAMIN D PO  Take 1-2 tablets by mouth See admin instructions. Vitamin d 5000 units per patient. Take twice every day except on Monday, Wednesday, and Fridays take 1 tablet     diphenhydrAMINE 25 MG tablet  Commonly known as: SOMINEX  Take 25 mg by mouth 2 (two) times daily. Take one in the morning and another one  every evening per patient     diphenhydramine-acetaminophen 25-500 MG Tabs  Commonly known as: TYLENOL PM  Take 1 tablet by mouth at bedtime. Take every night per patient     DSS 100 MG Caps  Take 100 mg by mouth 2 (two) times daily as needed for mild constipation.     hydrochlorothiazide 25 MG tablet  Commonly known as: HYDRODIURIL  TAKE 1 TABLET BY MOUTH EVERY DAY     HYDROcodone-acetaminophen 5-325 MG per tablet  Commonly known as: NORCO/VICODIN  Take 1-2 tablets  by mouth every 6 (six) hours as needed for moderate pain or severe pain (mild pain).     loperamide 2 MG capsule  Commonly known as: IMODIUM  Take 2 mg by mouth 2 (two) times daily. [employer] brand anti-diarrhea. Take every day per patient     magnesium gluconate 500 MG tablet  Commonly known as: MAGONATE  Take 500 mg by mouth daily.     methocarbamol 750 MG tablet  Commonly known as: ROBAXIN  Take 1 tablet (750 mg total) by mouth every 8 (eight) hours as needed for muscle spasms.     metoprolol 50 MG tablet  Commonly known as: LOPRESSOR  TAKE 1 TABLET BY MOUTH TWICE DAILY     polyethylene glycol packet  Commonly known as: MIRALAX / GLYCOLAX  Take 17 g by mouth daily as needed for mild constipation or moderate constipation.     promethazine 12.5 MG tablet  Commonly known as: PHENERGAN  Take 1 tablet (12.5 mg total) by mouth every 8 (eight) hours as needed for nausea or vomiting.     simvastatin 40 MG tablet  Commonly known as: ZOCOR  TAKE ONE TABLET BY MOUTH EVERY NIGHT AT BEDTIME FOR CHOLESTEROL     traMADol 50 MG tablet  Commonly known as: ULTRAM  Take 1-2 tablets (50-100 mg total) by mouth every 6 (six) hours as needed for moderate pain or severe pain.     zolpidem 5 MG tablet  Commonly known as: AMBIEN  Take 1 tablet (5 mg total) by mouth at bedtime as needed for sleep.         Follow-up Information    Follow up with MCKEOWN,WILLIAM DAVID, MD. Schedule an appointment as soon as possible for a visit in 2 weeks.   Specialty: Internal Medicine   Why: For post-hospital follow up   Contact information:   [address] [place] [address] [phone]       Follow up with [facility]. [facility] on 11/04/2014.   Why: For staple removal and recheck on your ribs, For post-hospital follow up at 2:00pm, please arrive 30 minutes early to fill out paperwork and check in    Contact information:   [place] [address] [phone]      Follow up with Rozanna Box, MD. Schedule an appointment as soon as possible for a visit in 4 weeks.   Specialty: Orthopedic Surgery   Contact information:   [place] [address] [phone]       Signed: Nehemiah Massed. Dort, [facility]  Trauma Service [phone]  10/22/2014, 2:45 PM        Cosigned by: Georganna Skeans, MD at 10/22/2014 2:49 PM

## 2014-10-27 ENCOUNTER — Encounter: Payer: Self-pay | Admitting: Internal Medicine

## 2014-10-27 ENCOUNTER — Ambulatory Visit (INDEPENDENT_AMBULATORY_CARE_PROVIDER_SITE_OTHER): Payer: Medicare Other | Admitting: Internal Medicine

## 2014-10-27 VITALS — BP 128/78 | HR 76 | Temp 98.1°F | Resp 18 | Ht 62.5 in | Wt 135.6 lb

## 2014-10-27 DIAGNOSIS — I10 Essential (primary) hypertension: Secondary | ICD-10-CM

## 2014-10-27 DIAGNOSIS — E559 Vitamin D deficiency, unspecified: Secondary | ICD-10-CM

## 2014-10-27 DIAGNOSIS — E785 Hyperlipidemia, unspecified: Secondary | ICD-10-CM

## 2014-10-27 DIAGNOSIS — R7309 Other abnormal glucose: Secondary | ICD-10-CM

## 2014-10-27 LAB — BASIC METABOLIC PANEL WITH GFR
BUN: 19 mg/dL (ref 6–23)
CHLORIDE: 97 meq/L (ref 96–112)
CO2: 32 mEq/L (ref 19–32)
Calcium: 9.4 mg/dL (ref 8.4–10.5)
Creat: 0.66 mg/dL (ref 0.50–1.10)
GFR, Est Non African American: 84 mL/min
Glucose, Bld: 161 mg/dL — ABNORMAL HIGH (ref 70–99)
POTASSIUM: 3.5 meq/L (ref 3.5–5.3)
SODIUM: 139 meq/L (ref 135–145)

## 2014-10-27 LAB — HEPATIC FUNCTION PANEL
ALK PHOS: 107 U/L (ref 39–117)
ALT: 109 U/L — ABNORMAL HIGH (ref 0–35)
AST: 71 U/L — ABNORMAL HIGH (ref 0–37)
Albumin: 3.8 g/dL (ref 3.5–5.2)
BILIRUBIN DIRECT: 0.1 mg/dL (ref 0.0–0.3)
BILIRUBIN INDIRECT: 0.4 mg/dL (ref 0.2–1.2)
TOTAL PROTEIN: 6.6 g/dL (ref 6.0–8.3)
Total Bilirubin: 0.5 mg/dL (ref 0.2–1.2)

## 2014-10-27 LAB — CBC WITH DIFFERENTIAL/PLATELET
Basophils Absolute: 0 10*3/uL (ref 0.0–0.1)
Basophils Relative: 0 % (ref 0–1)
EOS PCT: 3 % (ref 0–5)
Eosinophils Absolute: 0.2 10*3/uL (ref 0.0–0.7)
HEMATOCRIT: 37.4 % (ref 36.0–46.0)
HEMOGLOBIN: 12.2 g/dL (ref 12.0–15.0)
LYMPHS ABS: 1 10*3/uL (ref 0.7–4.0)
LYMPHS PCT: 12 % (ref 12–46)
MCH: 27.8 pg (ref 26.0–34.0)
MCHC: 32.6 g/dL (ref 30.0–36.0)
MCV: 85.2 fL (ref 78.0–100.0)
MPV: 9.8 fL (ref 9.4–12.4)
Monocytes Absolute: 1.2 10*3/uL — ABNORMAL HIGH (ref 0.1–1.0)
Monocytes Relative: 14 % — ABNORMAL HIGH (ref 3–12)
NEUTROS ABS: 5.9 10*3/uL (ref 1.7–7.7)
Neutrophils Relative %: 71 % (ref 43–77)
Platelets: 227 10*3/uL (ref 150–400)
RBC: 4.39 MIL/uL (ref 3.87–5.11)
RDW: 14.9 % (ref 11.5–15.5)
WBC: 8.3 10*3/uL (ref 4.0–10.5)

## 2014-10-27 LAB — TSH: TSH: 1.172 u[IU]/mL (ref 0.350–4.500)

## 2014-10-27 LAB — LIPID PANEL
Cholesterol: 159 mg/dL (ref 0–200)
HDL: 54 mg/dL (ref >39)
LDL CALC: 87 mg/dL (ref 0–99)
Total CHOL/HDL Ratio: 2.9 Ratio
Triglycerides: 89 mg/dL (ref <150)
VLDL: 18 mg/dL (ref 0–40)

## 2014-10-27 LAB — MAGNESIUM: Magnesium: 1.4 mg/dL — ABNORMAL LOW (ref 1.5–2.5)

## 2014-10-27 NOTE — Patient Instructions (Signed)

## 2014-10-27 NOTE — Progress Notes (Signed)
Patient ID: Penny [name], female   DOB: 1933/09/04, 80 y.o.   MRN: [id number]   This very nice 80 y.o.WWF presents for 3 month follow up with Hypertension, Hyperlipidemia, Pre-Diabetes and Vitamin D Deficiency.    Patient fell off of a ladder last week and at ER was found to have a nondisplaced Rt Scapular fx and several rt posterior ribs. She's doing well with decreased pain since the accident and no respiratory sx's.   Patient is treated for HTN & BP has been controlled at home. Today's BP: 128/78 mmHg. Patient has had no complaints of any cardiac type chest pain, palpitations, dyspnea/orthopnea/PND, dizziness, claudication, or dependent edema.   Hyperlipidemia is controlled with diet & meds. Patient denies myalgias or other med SE's. Last Lipids were at goal - Total Chol 141; HDL  63; LDL  62; Trig 81 on 07/21/2014.   Also, the patient has history of T2_NIDDM PreDiabetes and has had no symptoms of reactive hypoglycemia, diabetic polys, paresthesias or visual blurring.  Last A1c was  6.1% on  07/21/2014.   Further, the patient also has history of Vitamin D Deficiency and supplements vitamin D without any suspected side-effects. Last vitamin D was  96 on  07/21/2014.    Medication List   ALPRAZolam 0.5 MG tablet  Commonly known as:  XANAX  Take 1 tablet (0.5 mg total) by mouth 3 (three) times daily as needed for sleep or anxiety.     aspirin 81 MG chewable tablet  Chew 81 mg by mouth daily.     azelastine 0.1 % nasal spray  Commonly known as:  ASTELIN  Use i1 to 2 sprays each nostril once or twice day     Biotin 10 MG Tabs  Take 10 mg by mouth.     cholecalciferol 1000 UNITS tablet  Commonly known as:  VITAMIN D  Take 10,000 Units by mouth 4 (four) times a week.     VITAMIN D PO  Take 1-2 tablets by mouth See admin instructions. Vitamin d 5000 units per patient. Take twice every day except on Monday, Wednesday, and Fridays take 1 tablet     diphenhydrAMINE 25 MG tablet  Commonly known  as:  SOMINEX  Take 25 mg by mouth 2 (two) times daily. Take one in the morning and another one every evening per patient     diphenhydramine-acetaminophen 25-500 MG Tabs  Commonly known as:  TYLENOL PM  Take 1 tablet by mouth at bedtime. Take every night per patient     DSS 100 MG Caps  Take 100 mg by mouth 2 (two) times daily as needed for mild constipation.     hydrochlorothiazide 25 MG tablet  Commonly known as:  HYDRODIURIL  TAKE 1 TABLET BY MOUTH EVERY DAY     HYDROcodone-acetaminophen 5-325 MG per tablet  Commonly known as:  NORCO/VICODIN  Take 1-2 tablets by mouth every 6 (six) hours as needed for moderate pain or severe pain (mild pain).     loperamide 2 MG capsule  Commonly known as:  IMODIUM  Take 2 mg by mouth 2 (two) times daily. [employer] brand anti-diarrhea. Take every day per patient     magnesium gluconate 500 MG tablet  Commonly known as:  MAGONATE  Take 500 mg by mouth daily.     methocarbamol 750 MG tablet  Commonly known as:  ROBAXIN  Take 1 tablet (750 mg total) by mouth every 8 (eight) hours as needed for muscle spasms.  metoprolol 50 MG tablet  Commonly known as:  LOPRESSOR  TAKE 1 TABLET BY MOUTH TWICE DAILY     polyethylene glycol packet  Commonly known as:  MIRALAX / GLYCOLAX  Take 17 g by mouth daily as needed for mild constipation or moderate constipation.     promethazine 12.5 MG tablet  Commonly known as:  PHENERGAN  Take 1 tablet (12.5 mg total) by mouth every 8 (eight) hours as needed for nausea or vomiting.     simvastatin 40 MG tablet  Commonly known as:  ZOCOR  TAKE ONE TABLET BY MOUTH EVERY NIGHT AT BEDTIME FOR CHOLESTEROL     traMADol 50 MG tablet  Commonly known as:  ULTRAM  Take 1-2 tablets (50-100 mg total) by mouth every 6 (six) hours as needed for moderate pain or severe pain.     zolpidem 5 MG tablet  Commonly known as:  AMBIEN  Take 1 tablet (5 mg total) by mouth at bedtime as needed for sleep.       Allergies   Allergen Reactions  . Ace Inhibitors Cough  . Calcium-Containing Compounds Other (See Comments)    constpation  . Lactose Intolerance (Gi) Diarrhea    PMHx:   Past Medical History  Diagnosis Date  . Hypertension   . Hyperlipidemia   . Lung cancer, upper lobe 12/04/1996    RUL  . Osteoporosis   . Thigh shingles 12/04/1996  . S/P partial lobectomy of lung     [id number]  . Vitamin D deficiency   . Thoracic aorta atherosclerosis   . COPD (chronic obstructive pulmonary disease) with emphysema    Immunization History  Administered Date(s) Administered  . Influenza-Unspecified 08/04/2012, 09/03/2013  . Pneumococcal Conjugate-13 12/04/2004, 07/21/2014  . Td 04/03/2013   Past Surgical History  Procedure Laterality Date  . Carpal tunnel release Right     2009  . Shoulder adhesion release Right     2004  . Lung removal, partial Right     RUL 1998   FHx:    Reviewed / unchanged  SHx:    Reviewed / unchanged  Systems Review:  Constitutional: Denies fever, chills, wt changes, headaches, insomnia, fatigue, night sweats, change in appetite. Eyes: Denies redness, blurred vision, diplopia, discharge, itchy, watery eyes.  ENT: Denies discharge, congestion, post nasal drip, epistaxis, sore throat, earache, hearing loss, dental pain, tinnitus, vertigo, sinus pain, snoring.  CV: Denies chest pain, palpitations, irregular heartbeat, syncope, dyspnea, diaphoresis, orthopnea, PND, claudication or edema. Respiratory: denies cough, dyspnea, DOE, pleurisy, hoarseness, laryngitis, wheezing.  Gastrointestinal: Denies dysphagia, odynophagia, heartburn, reflux, water brash, abdominal pain or cramps, nausea, vomiting, bloating, diarrhea, constipation, hematemesis, melena, hematochezia  or hemorrhoids. Genitourinary: Denies dysuria, frequency, urgency, nocturia, hesitancy, discharge, hematuria or flank pain. Musculoskeletal: Denies arthralgias, myalgias, stiffness, jt. swelling, pain, limping or  strain/sprain.  Skin: Denies pruritus, rash, hives, warts, acne, eczema or change in skin lesion(s). Neuro: No weakness, tremor, incoordination, spasms, paresthesia or pain. Psychiatric: Denies confusion, memory loss or sensory loss. Endo: Denies change in weight, skin or hair change.  Heme/Lymph: No excessive bleeding, bruising or enlarged lymph nodes.  Exam:  BP 128/78   Pulse 76  Temp 98.1 F (36.7 C)  Resp 18  Ht 5' 2.5"  Wt 135 lb 9.6 oz   BMI 24.39 kg/m2  Appears well nourished and in no distress. Eyes: PERRLA, EOMs, conjunctiva no swelling or erythema. Sinuses: No frontal/maxillary tenderness ENT/Mouth: EAC's clear, TM's nl w/o erythema, bulging. Nares clear w/o erythema, swelling, exudates.  Oropharynx clear without erythema or exudates. Oral hygiene is good. Tongue normal, non obstructing. Hearing intact.  Neck: Supple. Thyroid nl. Car 2+/2+ without bruits, nodes or JVD. Chest: Respirations nl with BS clear & equal w/o rales, rhonchi, wheezing or stridor.  Cor: Heart sounds normal w/ regular rate and rhythm without sig. murmurs, gallops, clicks, or rubs. Peripheral pulses normal and equal  without edema.  Abdomen: Soft & bowel sounds normal. Non-tender w/o guarding, rebound, hernias, masses, or organomegaly.  Lymphatics: Unremarkable.  Musculoskeletal: Full ROM all peripheral extremities, joint stability, 5/5 strength, and normal gait.  Skin: Warm, dry without exposed rashes, lesions or ecchymosis apparent.  Neuro: Cranial nerves intact, reflexes equal bilaterally. Sensory-motor testing grossly intact. Tendon reflexes grossly intact.  Pysch: Alert & oriented x 3.  Insight and judgement nl & appropriate. No ideations.  Assessment and Plan:  1. Hypertension - Continue monitor blood pressure at home. Continue diet/meds same.  2. Hyperlipidemia - Continue diet/meds, exercise,& lifestyle modifications. Continue monitor periodic cholesterol/liver & renal functions   3.  Pre-Diabetes - Continue diet, exercise, lifestyle modifications. Monitor appropriate labs.  4. Vitamin D Deficiency - Continue supplementation.   Recommended regular exercise, BP monitoring, weight control, and discussed med and SE's. Recommended labs to assess and monitor clinical status. Further disposition pending results of labs.

## 2014-10-28 LAB — HEMOGLOBIN A1C
Hgb A1c MFr Bld: 5.9 % — ABNORMAL HIGH
Mean Plasma Glucose: 123 mg/dL — ABNORMAL HIGH

## 2014-10-28 LAB — VITAMIN D 25 HYDROXY (VIT D DEFICIENCY, FRACTURES): Vit D, 25-Hydroxy: 74 ng/mL (ref 30–100)

## 2014-10-28 LAB — INSULIN, FASTING: Insulin fasting, serum: 29.3 u[IU]/mL — ABNORMAL HIGH (ref 2.0–19.6)

## 2014-11-05 ENCOUNTER — Other Ambulatory Visit: Payer: Self-pay | Admitting: Emergency Medicine

## 2014-11-05 MED ORDER — METOPROLOL TARTRATE 50 MG PO TABS: 50.0000 mg | ORAL_TABLET | Freq: Two times a day (BID) | ORAL | Status: DC

## 2014-11-12 ENCOUNTER — Ambulatory Visit: Payer: Medicare Other

## 2014-11-30 ENCOUNTER — Ambulatory Visit (INDEPENDENT_AMBULATORY_CARE_PROVIDER_SITE_OTHER): Payer: Medicare Other | Admitting: *Deleted

## 2014-11-30 DIAGNOSIS — Z79899 Other long term (current) drug therapy: Secondary | ICD-10-CM

## 2014-11-30 LAB — HEPATIC FUNCTION PANEL
ALT: 13 U/L (ref 0–35)
AST: 21 U/L (ref 0–37)
Albumin: 4 g/dL (ref 3.5–5.2)
Alkaline Phosphatase: 103 U/L (ref 39–117)
BILIRUBIN DIRECT: 0.1 mg/dL (ref 0.0–0.3)
BILIRUBIN INDIRECT: 0.3 mg/dL (ref 0.2–1.2)
TOTAL PROTEIN: 6.6 g/dL (ref 6.0–8.3)
Total Bilirubin: 0.4 mg/dL (ref 0.2–1.2)

## 2014-11-30 NOTE — Progress Notes (Signed)
Patient ID: Penny [name], female   DOB: 1933/02/15, 80 y.o.   MRN: [id number] Patient presents for 1 month recheck HFP per Dr. Idell Pickles orders.

## 2014-12-17 ENCOUNTER — Ambulatory Visit
Admission: RE | Admit: 2014-12-17 | Discharge: 2014-12-17 | Disposition: A | Discharge status: Home / Self Care | Payer: Medicare Other | Source: Ambulatory Visit

## 2014-12-17 DIAGNOSIS — Z1231 Encounter for screening mammogram for malignant neoplasm of breast: Secondary | ICD-10-CM | POA: Diagnosis not present

## 2015-02-11 ENCOUNTER — Encounter: Payer: Self-pay | Admitting: Physician Assistant

## 2015-02-11 ENCOUNTER — Ambulatory Visit (INDEPENDENT_AMBULATORY_CARE_PROVIDER_SITE_OTHER): Payer: Medicare Other | Admitting: Physician Assistant

## 2015-02-11 VITALS — BP 110/68 | HR 68 | Temp 97.7°F | Resp 16 | Ht 62.5 in | Wt 134.0 lb

## 2015-02-11 DIAGNOSIS — E785 Hyperlipidemia, unspecified: Secondary | ICD-10-CM | POA: Diagnosis not present

## 2015-02-11 DIAGNOSIS — M8580 Other specified disorders of bone density and structure, unspecified site: Secondary | ICD-10-CM

## 2015-02-11 DIAGNOSIS — R7303 Prediabetes: Secondary | ICD-10-CM

## 2015-02-11 DIAGNOSIS — D225 Melanocytic nevi of trunk: Secondary | ICD-10-CM | POA: Diagnosis not present

## 2015-02-11 DIAGNOSIS — M7711 Lateral epicondylitis, right elbow: Secondary | ICD-10-CM

## 2015-02-11 DIAGNOSIS — R6889 Other general symptoms and signs: Secondary | ICD-10-CM

## 2015-02-11 DIAGNOSIS — Z0001 Encounter for general adult medical examination with abnormal findings: Secondary | ICD-10-CM | POA: Diagnosis not present

## 2015-02-11 DIAGNOSIS — R7309 Other abnormal glucose: Secondary | ICD-10-CM

## 2015-02-11 DIAGNOSIS — I10 Essential (primary) hypertension: Secondary | ICD-10-CM

## 2015-02-11 DIAGNOSIS — Z1331 Encounter for screening for depression: Secondary | ICD-10-CM

## 2015-02-11 DIAGNOSIS — L814 Other melanin hyperpigmentation: Secondary | ICD-10-CM | POA: Diagnosis not present

## 2015-02-11 DIAGNOSIS — Z9109 Other allergy status, other than to drugs and biological substances: Secondary | ICD-10-CM

## 2015-02-11 DIAGNOSIS — E559 Vitamin D deficiency, unspecified: Secondary | ICD-10-CM | POA: Diagnosis not present

## 2015-02-11 DIAGNOSIS — Z0000 Encounter for general adult medical examination without abnormal findings: Secondary | ICD-10-CM

## 2015-02-11 DIAGNOSIS — Z85118 Personal history of other malignant neoplasm of bronchus and lung: Secondary | ICD-10-CM

## 2015-02-11 DIAGNOSIS — L570 Actinic keratosis: Secondary | ICD-10-CM | POA: Diagnosis not present

## 2015-02-11 DIAGNOSIS — Z85828 Personal history of other malignant neoplasm of skin: Secondary | ICD-10-CM | POA: Diagnosis not present

## 2015-02-11 DIAGNOSIS — Z08 Encounter for follow-up examination after completed treatment for malignant neoplasm: Secondary | ICD-10-CM | POA: Diagnosis not present

## 2015-02-11 DIAGNOSIS — Z9181 History of falling: Secondary | ICD-10-CM

## 2015-02-11 DIAGNOSIS — Z79899 Other long term (current) drug therapy: Secondary | ICD-10-CM | POA: Diagnosis not present

## 2015-02-11 LAB — CBC WITH DIFFERENTIAL/PLATELET
BASOS ABS: 0 10*3/uL (ref 0.0–0.1)
BASOS PCT: 0 % (ref 0–1)
Eosinophils Absolute: 0.2 10*3/uL (ref 0.0–0.7)
Eosinophils Relative: 3 % (ref 0–5)
HCT: 41.5 % (ref 36.0–46.0)
Hemoglobin: 13.3 g/dL (ref 12.0–15.0)
Lymphocytes Relative: 27 % (ref 12–46)
Lymphs Abs: 1.6 10*3/uL (ref 0.7–4.0)
MCH: 27.7 pg (ref 26.0–34.0)
MCHC: 32 g/dL (ref 30.0–36.0)
MCV: 86.5 fL (ref 78.0–100.0)
MPV: 9.7 fL (ref 8.6–12.4)
Monocytes Absolute: 0.7 10*3/uL (ref 0.1–1.0)
Monocytes Relative: 11 % (ref 3–12)
NEUTROS PCT: 59 % (ref 43–77)
Neutro Abs: 3.6 10*3/uL (ref 1.7–7.7)
Platelets: 200 10*3/uL (ref 150–400)
RBC: 4.8 MIL/uL (ref 3.87–5.11)
RDW: 14.2 % (ref 11.5–15.5)
WBC: 6.1 10*3/uL (ref 4.0–10.5)

## 2015-02-11 LAB — BASIC METABOLIC PANEL WITH GFR
BUN: 21 mg/dL (ref 6–23)
CO2: 30 mEq/L (ref 19–32)
CREATININE: 0.79 mg/dL (ref 0.50–1.10)
Calcium: 9.3 mg/dL (ref 8.4–10.5)
Chloride: 100 mEq/L (ref 96–112)
GFR, Est African American: 82 mL/min
GFR, Est Non African American: 71 mL/min
GLUCOSE: 92 mg/dL (ref 70–99)
POTASSIUM: 4.3 meq/L (ref 3.5–5.3)
Sodium: 143 mEq/L (ref 135–145)

## 2015-02-11 LAB — HEPATIC FUNCTION PANEL
ALBUMIN: 4.3 g/dL (ref 3.5–5.2)
ALT: 18 U/L (ref 0–35)
AST: 28 U/L (ref 0–37)
Alkaline Phosphatase: 78 U/L (ref 39–117)
BILIRUBIN DIRECT: 0.1 mg/dL (ref 0.0–0.3)
BILIRUBIN TOTAL: 0.5 mg/dL (ref 0.2–1.2)
Indirect Bilirubin: 0.4 mg/dL (ref 0.2–1.2)
TOTAL PROTEIN: 6.8 g/dL (ref 6.0–8.3)

## 2015-02-11 LAB — LIPID PANEL
CHOL/HDL RATIO: 2.3 ratio
CHOLESTEROL: 160 mg/dL (ref 0–200)
HDL: 69 mg/dL (ref >=46)
LDL Cholesterol: 74 mg/dL (ref 0–99)
Triglycerides: 84 mg/dL (ref <150)
VLDL: 17 mg/dL (ref 0–40)

## 2015-02-11 LAB — MAGNESIUM: Magnesium: 1.7 mg/dL (ref 1.5–2.5)

## 2015-02-11 LAB — TSH: TSH: 1.136 u[IU]/mL (ref 0.350–4.500)

## 2015-02-11 MED ORDER — ALPRAZOLAM 0.5 MG PO TABS: 0.5000 mg | ORAL_TABLET | Freq: Three times a day (TID) | ORAL | Status: AC | PRN

## 2015-02-11 MED ORDER — MELOXICAM 15 MG PO TABS: ORAL_TABLET | ORAL | Status: DC

## 2015-02-11 MED ORDER — ACYCLOVIR 400 MG PO TABS: 400.0000 mg | ORAL_TABLET | Freq: Two times a day (BID) | ORAL | Status: DC

## 2015-02-11 NOTE — Progress Notes (Signed)
MEDICARE ANNUAL WELLNESS VISIT AND FOLLOW UP  Assessment:   1. Essential hypertension - continue medications, DASH diet, exercise and monitor at home. Call if greater than 130/80.  - CBC with Differential/Platelet - BASIC METABOLIC PANEL WITH GFR - Hepatic function panel - TSH  2. Hyperlipidemia -continue medications, check lipids, decrease fatty foods, increase activity.  - Lipid panel  3. Prediabetes Discussed general issues about diabetes pathophysiology and management., Educational material distributed., Suggested low cholesterol diet., Encouraged aerobic exercise., Discussed foot care., Reminded to get yearly retinal exam. - Hemoglobin A1c - HM DIABETES FOOT EXAM  4. Vitamin D deficiency - Vit D  25 hydroxy (rtn osteoporosis monitoring)  5. Medication management - Magnesium  6. Screening for depression negative  7. Routine general medical examination at a health care facility  8. Hx of cancer of lung CXR 10/2014 normal, continue follow up  9. At moderate risk for fall Fall safety discussed  10. Osteopenia Get with recent Fracture - DG Bone Density; Future  11. Environmental allergies Allergic rhinitis- Allegra OTC, increase H20, allergy hygiene explained.  12. Lateral epicondylitis (tennis elbow), right If worse follow up in office.  - meloxicam (MOBIC) 15 MG tablet; Take once daily as needed daily with food for pain, do not take aleve/ibuprofen with it, can take tylenol  Dispense: 90 tablet; Refill: 0   Plan:   During the course of the visit the patient was educated and counseled about appropriate screening and preventive services including:    Pneumococcal vaccine   Influenza vaccine  Td vaccine  Screening electrocardiogram  Screening mammography  Bone densitometry screening  Colorectal cancer screening  Diabetes screening  Glaucoma screening  Nutrition counseling   Advanced directives: given info/requested  Conditions/risks  identified: BMI: Discussed weight loss, diet, and increase physical activity.  Increase physical activity: AHA recommends 150 minutes of physical activity a week.  Medications reviewed DEXA- due 2016, especially with fracture.  Diabetes is at goal Urinary Incontinence is not an issue: discussed non pharmacology and pharmacology options.  Fall risk: low-moderate- had fall from ladder- discussed PT, home fall assessment, medications.    Subjective:   Penny [name] is a 81 y.o. female who presents for Medicare Annual Wellness Visit and 3 month follow up on hypertension, prediabetes, hyperlipidemia, vitamin D def.  Date of last medicare wellness visit 07/21/2014  Her blood pressure has been controlled at home, today their BP is BP: 110/68 mmHg She does not workout but is very active and does try to walk. She denies chest pain, shortness of breath, dizziness.  She is on cholesterol medication, pravastatin 40 and denies myalgias. Her cholesterol is at goal. The cholesterol last visit was:   Lab Results  Component Value Date   CHOL 159 10/27/2014   HDL 54 10/27/2014   LDLCALC 87 10/27/2014   TRIG 89 10/27/2014   CHOLHDL 2.9 10/27/2014   She has been working on diet and exercise for prediabetes, and denies paresthesia of the feet, polydipsia and polyuria. Last A1C in the office was:  Lab Results  Component Value Date   HGBA1C 5.9* 10/27/2014  Patient is on Vitamin D supplement. Lab Results  Component Value Date   VD25OH 74 10/27/2014     She had a fall in Nov off a ladder with fractured ribs and shoulder, she states that she has recovered well, no sequela other than her right elbow is tender. Has lateral epicondyle pain, worse with extension, with lifting items, no numbness/tingling/radiation of pain/weakness.  Last  bone density was 09/2013. She is part time job at [employer] and tries to be careful not to pick up anything too heavy.    Medication Review Current Outpatient  Prescriptions on File Prior to Visit  Medication Sig Dispense Refill  . aspirin 81 MG chewable tablet Chew 81 mg by mouth daily.    . Cholecalciferol (VITAMIN D PO) Take 1-2 tablets by mouth See admin instructions. Vitamin d 5000 units per patient. Take twice every day except on Monday, Wednesday, and Fridays take 1 tablet    . diphenhydramine-acetaminophen (TYLENOL PM) 25-500 MG TABS Take 1 tablet by mouth at bedtime. Take every night per patient    . hydrochlorothiazide (HYDRODIURIL) 25 MG tablet TAKE 1 TABLET BY MOUTH EVERY DAY 90 tablet 3  . loperamide (IMODIUM) 2 MG capsule Take 2 mg by mouth 2 (two) times daily. [employer] brand anti-diarrhea. Take every day per patient    . magnesium gluconate (MAGONATE) 500 MG tablet Take 500 mg by mouth daily.     . metoprolol (LOPRESSOR) 50 MG tablet Take 1 tablet (50 mg total) by mouth 2 (two) times daily. 180 tablet 1  . simvastatin (ZOCOR) 40 MG tablet TAKE ONE TABLET BY MOUTH EVERY NIGHT AT BEDTIME FOR CHOLESTEROL 90 tablet 2   No current facility-administered medications on file prior to visit.    Current Problems (verified) Patient Active Problem List   Diagnosis Date Noted  . Multiple rib fractures 10/20/2014  . Prediabetes 04/15/2014  . Medication management 04/15/2014  . Hypertension   . Hyperlipidemia   . Vitamin D deficiency   . Lung cancer, upper lobe 12/04/1996    Immunization History  Administered Date(s) Administered  . Influenza-Unspecified 08/04/2012, 09/03/2013  . Pneumococcal Conjugate-13 12/04/2004, 07/21/2014  . Td 04/03/2013   Preventative care: Last colonoscopy: 2012 due 2017 Last mammogram: 12/2014 Last pap smear/pelvic exam: 2012 negative DEXA: 2014 due 2016 Echo 2009 normal EF CXR 10/2014 CT cervical spin 10/2014 CT head 10/20/2014  Prior vaccinations: TD or Tdap: 2014  Influenza: 2014 gets from [facility]  Pneumococcal: 2006 Prevnar 13: DUE Shingles/Zostavax: declines due to cost   Names of Other  Physician/Practitioners you currently use: 1. [facility]- here for primary care 2. Dr. Santa Lighter , eye doctor, last visit 02/2014, has appointment tomorrow 3. Dr. Gilford Rile, dentist, last visit 07/2014 Patient Care Team: Unk Pinto, MD as PCP - General (Internal Medicine) Druscilla Brownie, MD as Consulting Physician (Dermatology)- has appointment tomorrow Daryll Brod, MD as Consulting Physician (Orthopedic Surgery) Richmond Campbell, MD as Consulting Physician (Gastroenterology) Nicanor Alcon, MD as Attending Physician (Thoracic Surgery)  History reviewed: allergies, current medications, past family history, past medical history, past social history, past surgical history and problem list  Past Surgical History  Procedure Laterality Date  . Carpal tunnel release Right     2009  . Shoulder adhesion release Right     2004  . Lung removal, partial Right     RUL 1998   Allergies  Allergen Reactions  . Ace Inhibitors Cough  . Calcium-Containing Compounds Other (See Comments)    constpation  . Lactose Intolerance (Gi) Diarrhea   Family History  Problem Relation Age of Onset  . Heart disease Mother   . Lung cancer Father     Tobacco History  Substance Use Topics  . Smoking status: Former Smoker    Quit date: 12/04/1996  . Smokeless tobacco: Never Used  . Alcohol Use: 0.6 oz/week    1  Glasses of wine per week   MEDICARE WELLNESS OBJECTIVES: Tobacco use: She does not smoke.  Patient is a former smoker. Alcohol Current alcohol use: glass of wine with dinner Caffeine Current caffeine use: coffee 2 /day Osteoporosis: postmenopausal estrogen deficiency and dietary calcium and/or vitamin D deficiency, History of fracture in the past year: yes Diet: in general, a "healthy" diet   Physical activity: walking Depression/mood screen:  Yes - No Depression Hearing: impaired Visual acuity: normal,  does perform annual eye exam  ADLs: self care Fall  risk:  Low- Moderate fall risk Home safety: good, discussed fall safety and not climbing on ladders.  Cognitive Testing  Alert? Yes  Normal Appearance?Yes  Oriented to person? Yes  Place? Yes   Time? Yes  Recall of three objects?  Yes  Can perform simple calculations? Yes  Displays appropriate judgment?Yes  Can read the correct time from a watch face?Yes EOL planning: Yes   Objective:   Blood pressure 110/68, pulse 68, temperature 97.7 F (36.5 C), resp. rate 16, height 5' 2.5" (1.588 m), weight 134 lb (60.782 kg). Body mass index is 24.1 kg/(m^2).  General appearance: alert, no distress, WD/WN,  female HEENT: normocephalic, sclerae anicteric, TMs pearly, nares patent, no discharge or erythema, pharynx normal Oral cavity: MMM, no lesions Neck: supple, no lymphadenopathy, no thyromegaly, no masses Heart: RRR, normal S1, S2, no murmurs Lungs: CTA bilaterally, no wheezes, rhonchi, or rales Abdomen: +bs, soft, non tender, non distended, no masses, no hepatomegaly, no splenomegaly Musculoskeletal:  no swelling, no obvious deformity, Right shoulder and right wrist full ROM and nontender.right elbow without erythema, warmth. without minor swelling, with tenderness at lateral epicondyle with slight decreased grip. Pain with supination against resistance. Good distal pulses, cap refill, and sensation intact.  ROM Intact bilaterally in upper extremities. Extremities: no edema, no cyanosis, no clubbing Pulses: 2+ symmetric, upper and lower extremities, normal cap refill Neurological: alert, oriented x 3, CN2-12 intact, strength normal upper extremities and lower extremities, sensation normal throughout, DTRs 2+ throughout, no cerebellar signs, gait normal Psychiatric: normal affect, behavior normal, pleasant  Breast: defer Gyn: defer Rectal: defer  Medicare Attestation I have personally reviewed: The patient's medical and social history Their use of alcohol, tobacco or illicit  drugs Their current medications and supplements The patient's functional ability including ADLs,fall risks, home safety risks, cognitive, and hearing and visual impairment Diet and physical activities Evidence for depression or mood disorders  The patient's weight, height, BMI, and visual acuity have been recorded in the chart.  I have made referrals, counseling, and provided education to the patient based on review of the above and I have provided the patient with a written personalized care plan for preventive services.     Vicie Mutters, PA-C   02/11/2015

## 2015-02-11 NOTE — Patient Instructions (Signed)
Will need DEXA   Lateral Epicondylitis (Tennis Elbow) with Rehab Lateral epicondylitis involves inflammation and pain around the outer portion of the elbow. The pain is caused by inflammation of the tendons in the forearm that bring back (extend) the wrist. Lateral epicondylitis is also called tennis elbow, because it is very common in tennis players. However, it may occur in any individual who extends the wrist repetitively. If lateral epicondylitis is left untreated, it may become a chronic problem. SYMPTOMS   Pain, tenderness, and inflammation on the outer (lateral) side of the elbow.  Pain or weakness with gripping activities.  Pain that increases with wrist-twisting motions (playing tennis, using a screwdriver, opening a door or a jar).  Pain with lifting objects, including a coffee cup. CAUSES  Lateral epicondylitis is caused by inflammation of the tendons that extend the wrist. Causes of injury may include:  Repetitive stress and strain on the muscles and tendons that extend the wrist.  Sudden change in activity level or intensity.  Incorrect grip in racquet sports.  Incorrect grip size of racquet (often too large).  Incorrect hitting position or technique (usually backhand, leading with the elbow).  Using a racket that is too heavy. RISK INCREASES WITH:  Sports or occupations that require repetitive and/or strenuous forearm and wrist movements (tennis, squash, racquetball, carpentry).  Poor wrist and forearm strength and flexibility.  Failure to warm up properly before activity.  Resuming activity before healing, rehabilitation, and conditioning are complete. PREVENTION   Warm up and stretch properly before activity.  Maintain physical fitness:  Strength, flexibility, and endurance.  Cardiovascular fitness.  Wear and use properly fitted equipment.  Learn and use proper technique and have a coach correct improper technique.  Wear a tennis elbow  (counterforce) brace. PROGNOSIS  The course of this condition depends on the degree of the injury. If treated properly, acute cases (symptoms lasting less than 4 weeks) are often resolved in 2 to 6 weeks. Chronic (longer lasting cases) often resolve in 3 to 6 months but may require physical therapy. RELATED COMPLICATIONS   Frequently recurring symptoms, resulting in a chronic problem. Properly treating the problem the first time decreases frequency of recurrence.  Chronic inflammation, scarring tendon degeneration, and partial tendon tear, requiring surgery.  Delayed healing or resolution of symptoms. TREATMENT  Treatment first involves the use of ice and medicine to reduce pain and inflammation. Strengthening and stretching exercises may help reduce discomfort if performed regularly. These exercises may be performed at home if the condition is an acute injury. Chronic cases may require a referral to a physical therapist for evaluation and treatment. Your caregiver may advise a corticosteroid injection to help reduce inflammation. Rarely, surgery is needed. MEDICATION  If pain medicine is needed, nonsteroidal anti-inflammatory medicines (aspirin and ibuprofen), or other minor pain relievers (acetaminophen), are often advised.  Do not take pain medicine for 7 days before surgery.  Prescription pain relievers may be given, if your caregiver thinks they are needed. Use only as directed and only as much as you need.  Corticosteroid injections may be recommended. These injections should be reserved only for the most severe cases, because they can only be given a certain number of times. HEAT AND COLD  Cold treatment (icing) should be applied for 10 to 15 minutes every 2 to 3 hours for inflammation and pain, and immediately after activity that aggravates your symptoms. Use ice packs or an ice massage.  Heat treatment may be used before performing stretching and strengthening  activities prescribed  by your caregiver, physical therapist, or athletic trainer. Use a heat pack or a warm water soak. SEEK MEDICAL CARE IF: Symptoms get worse or do not improve in 2 weeks, despite treatment. EXERCISES  RANGE OF MOTION (ROM) AND STRETCHING EXERCISES - Epicondylitis, Lateral (Tennis Elbow) These exercises may help you when beginning to rehabilitate your injury. Your symptoms may go away with or without further involvement from your physician, physical therapist, or athletic trainer. While completing these exercises, remember:   Restoring tissue flexibility helps normal motion to return to the joints. This allows healthier, less painful movement and activity.  An effective stretch should be held for at least 30 seconds.  A stretch should never be painful. You should only feel a gentle lengthening or release in the stretched tissue. RANGE OF MOTION - Wrist Flexion, Active-Assisted  Extend your right / left elbow with your fingers pointing down.*  Gently pull the back of your hand towards you, until you feel a gentle stretch on the top of your forearm.  Hold this position for __________ seconds. Repeat __________ times. Complete this exercise __________ times per day.  *If directed by your physician, physical therapist or athletic trainer, complete this stretch with your elbow bent, rather than extended. RANGE OF MOTION - Wrist Extension, Active-Assisted  Extend your right / left elbow and turn your palm upwards.*  Gently pull your palm and fingertips back, so your wrist extends and your fingers point more toward the ground.  You should feel a gentle stretch on the inside of your forearm.  Hold this position for __________ seconds. Repeat __________ times. Complete this exercise __________ times per day. *If directed by your physician, physical therapist or athletic trainer, complete this stretch with your elbow bent, rather than extended. STRETCH - Wrist Flexion  Place the back of your  right / left hand on a tabletop, leaving your elbow slightly bent. Your fingers should point away from your body.  Gently press the back of your hand down onto the table by straightening your elbow. You should feel a stretch on the top of your forearm.  Hold this position for __________ seconds. Repeat __________ times. Complete this stretch __________ times per day.  STRETCH - Wrist Extension   Place your right / left fingertips on a tabletop, leaving your elbow slightly bent. Your fingers should point backwards.  Gently press your fingers and palm down onto the table by straightening your elbow. You should feel a stretch on the inside of your forearm.  Hold this position for __________ seconds. Repeat __________ times. Complete this stretch __________ times per day.  STRENGTHENING EXERCISES - Epicondylitis, Lateral (Tennis Elbow) These exercises may help you when beginning to rehabilitate your injury. They may resolve your symptoms with or without further involvement from your physician, physical therapist, or athletic trainer. While completing these exercises, remember:   Muscles can gain both the endurance and the strength needed for everyday activities through controlled exercises.  Complete these exercises as instructed by your physician, physical therapist or athletic trainer. Increase the resistance and repetitions only as guided.  You may experience muscle soreness or fatigue, but the pain or discomfort you are trying to eliminate should never worsen during these exercises. If this pain does get worse, stop and make sure you are following the directions exactly. If the pain is still present after adjustments, discontinue the exercise until you can discuss the trouble with your caregiver. STRENGTH - Wrist Flexors  Sit with your right /  left forearm palm-up and fully supported on a table or countertop. Your elbow should be resting below the height of your shoulder. Allow your wrist to  extend over the edge of the surface.  Loosely holding a __________ weight, or a piece of rubber exercise band or tubing, slowly curl your hand up toward your forearm.  Hold this position for __________ seconds. Slowly lower the wrist back to the starting position in a controlled manner. Repeat __________ times. Complete this exercise __________ times per day.  STRENGTH - Wrist Extensors  Sit with your right / left forearm palm-down and fully supported on a table or countertop. Your elbow should be resting below the height of your shoulder. Allow your wrist to extend over the edge of the surface.  Loosely holding a __________ weight, or a piece of rubber exercise band or tubing, slowly curl your hand up toward your forearm.  Hold this position for __________ seconds. Slowly lower the wrist back to the starting position in a controlled manner. Repeat __________ times. Complete this exercise __________ times per day.  STRENGTH - Ulnar Deviators  Stand with a ____________________ weight in your right / left hand, or sit while holding a rubber exercise band or tubing, with your healthy arm supported on a table or countertop.  Move your wrist, so that your pinkie travels toward your forearm and your thumb moves away from your forearm.  Hold this position for __________ seconds and then slowly lower the wrist back to the starting position. Repeat __________ times. Complete this exercise __________ times per day STRENGTH - Radial Deviators  Stand with a ____________________ weight in your right / left hand, or sit while holding a rubber exercise band or tubing, with your injured arm supported on a table or countertop.  Raise your hand upward in front of you or pull up on the rubber tubing.  Hold this position for __________ seconds and then slowly lower the wrist back to the starting position. Repeat __________ times. Complete this exercise __________ times per day. STRENGTH - Forearm  Supinators   Sit with your right / left forearm supported on a table, keeping your elbow below shoulder height. Rest your hand over the edge, palm down.  Gently grip a hammer or a soup ladle.  Without moving your elbow, slowly turn your palm and hand upward to a "thumbs-up" position.  Hold this position for __________ seconds. Slowly return to the starting position. Repeat __________ times. Complete this exercise __________ times per day.  STRENGTH - Forearm Pronators   Sit with your right / left forearm supported on a table, keeping your elbow below shoulder height. Rest your hand over the edge, palm up.  Gently grip a hammer or a soup ladle.  Without moving your elbow, slowly turn your palm and hand upward to a "thumbs-up" position.  Hold this position for __________ seconds. Slowly return to the starting position. Repeat __________ times. Complete this exercise __________ times per day.  STRENGTH - Grip  Grasp a tennis ball, a dense sponge, or a large, rolled sock in your hand.  Squeeze as hard as you can, without increasing any pain.  Hold this position for __________ seconds. Release your grip slowly. Repeat __________ times. Complete this exercise __________ times per day.  STRENGTH - Elbow Extensors, Isometric  Stand or sit upright, on a firm surface. Place your right / left arm so that your palm faces your stomach, and it is at the height of your waist.  Place  your opposite hand on the underside of your forearm. Gently push up as your right / left arm resists. Push as hard as you can with both arms, without causing any pain or movement at your right / left elbow. Hold this stationary position for __________ seconds. Gradually release the tension in both arms. Allow your muscles to relax completely before repeating. Document Released: 11/20/2005 Document Revised: 04/06/2014 Document Reviewed: 03/04/2009 [facility] Patient Information 2015 [place], [place]. This information is  not intended to replace advice given to you by your health care provider. Make sure you discuss any questions you have with your health care provider.

## 2015-02-12 DIAGNOSIS — H2513 Age-related nuclear cataract, bilateral: Secondary | ICD-10-CM | POA: Diagnosis not present

## 2015-02-12 LAB — HEMOGLOBIN A1C
Hgb A1c MFr Bld: 5.6 % (ref <5.7)
MEAN PLASMA GLUCOSE: 114 mg/dL (ref <117)

## 2015-02-12 LAB — VITAMIN D 25 HYDROXY (VIT D DEFICIENCY, FRACTURES): VIT D 25 HYDROXY: 80 ng/mL (ref 30–100)

## 2015-02-18 ENCOUNTER — Telehealth: Payer: Self-pay

## 2015-02-18 NOTE — Telephone Encounter (Signed)
I called patient and left message on her voicemail that the order/referral has been faxed to [facility] fax # [phone], verified that referral was received and that they did make a call to her that she has not yet returned, I advised her on her voicemail to call [facility] at [phone] to schedule bone density at her convenience

## 2015-02-25 ENCOUNTER — Encounter: Payer: Self-pay | Admitting: Physician Assistant

## 2015-02-25 DIAGNOSIS — I700 Atherosclerosis of aorta: Secondary | ICD-10-CM | POA: Insufficient documentation

## 2015-02-25 DIAGNOSIS — J439 Emphysema, unspecified: Secondary | ICD-10-CM | POA: Insufficient documentation

## 2015-02-26 DIAGNOSIS — M8580 Other specified disorders of bone density and structure, unspecified site: Secondary | ICD-10-CM | POA: Diagnosis not present

## 2015-03-02 ENCOUNTER — Other Ambulatory Visit: Payer: Self-pay

## 2015-03-02 NOTE — Telephone Encounter (Signed)
Patient aware of Bone Density results, declines Fosamax at this time, will discuss with provider at next follow up

## 2015-04-17 ENCOUNTER — Encounter: Payer: Self-pay | Admitting: *Deleted

## 2015-04-17 DIAGNOSIS — M8580 Other specified disorders of bone density and structure, unspecified site: Secondary | ICD-10-CM

## 2015-04-19 ENCOUNTER — Encounter: Payer: Self-pay | Admitting: Internal Medicine

## 2015-05-04 ENCOUNTER — Other Ambulatory Visit: Payer: Self-pay | Admitting: Emergency Medicine

## 2015-05-04 ENCOUNTER — Other Ambulatory Visit: Payer: Self-pay | Admitting: Internal Medicine

## 2015-05-13 ENCOUNTER — Ambulatory Visit (INDEPENDENT_AMBULATORY_CARE_PROVIDER_SITE_OTHER): Payer: Medicare Other | Admitting: Internal Medicine

## 2015-05-13 ENCOUNTER — Encounter: Payer: Self-pay | Admitting: Internal Medicine

## 2015-05-13 VITALS — BP 132/70 | HR 74 | Temp 98.2°F | Resp 16 | Ht 62.5 in | Wt 133.0 lb

## 2015-05-13 DIAGNOSIS — E559 Vitamin D deficiency, unspecified: Secondary | ICD-10-CM

## 2015-05-13 DIAGNOSIS — E785 Hyperlipidemia, unspecified: Secondary | ICD-10-CM

## 2015-05-13 DIAGNOSIS — R7309 Other abnormal glucose: Secondary | ICD-10-CM

## 2015-05-13 DIAGNOSIS — Z79899 Other long term (current) drug therapy: Secondary | ICD-10-CM

## 2015-05-13 DIAGNOSIS — I10 Essential (primary) hypertension: Secondary | ICD-10-CM

## 2015-05-13 DIAGNOSIS — R21 Rash and other nonspecific skin eruption: Secondary | ICD-10-CM

## 2015-05-13 DIAGNOSIS — R7303 Prediabetes: Secondary | ICD-10-CM

## 2015-05-13 MED ORDER — VALACYCLOVIR HCL 1 G PO TABS: 2000.0000 mg | ORAL_TABLET | Freq: Two times a day (BID) | ORAL | Status: DC

## 2015-05-13 MED ORDER — LIDOCAINE-HYDROCORTISONE ACE 2-2 % RE KIT: PACK | RECTAL | Status: DC

## 2015-05-13 MED ORDER — PREDNISONE 20 MG PO TABS: ORAL_TABLET | ORAL | Status: DC

## 2015-05-13 MED ORDER — VALACYCLOVIR HCL 1 G PO TABS: 1000.0000 mg | ORAL_TABLET | Freq: Three times a day (TID) | ORAL | Status: AC

## 2015-05-13 NOTE — Progress Notes (Signed)
Patient ID: Penny [name], female   DOB: 09/22/1933, 81 y.o.   MRN: [id number]  Assessment and Plan:    1. Essential hypertension -monitor at home -cont diet and exercise - TSH  2. Prediabetes  - Hemoglobin A1c - Insulin, random  3. Hyperlipidemia -cut simvistatin to T,Th, S - Lipid panel  4. Vitamin D deficiency -cont supplement - Vit D  25 hydroxy (rtn osteoporosis monitoring)  5. Medication management  - CBC with Differential/Platelet - BASIC METABOLIC PANEL WITH GFR - Hepatic function panel - Magnesium  6. Rash and nonspecific skin eruption -rash is vessicular and appears to be one sided.  History of HSV.  Shingles vs HSV infection?   - predniSONE (DELTASONE) 20 MG tablet; 3 tabs po day one, then 2 tabs daily x 4 days  Dispense: 11 tablet; Refill: 0 - Lidocaine-Hydrocortisone Ace 2-2 % KIT; Use 2-3 times daily as needed for rash and rectal discomfort.  Dispense: 1 each; Refill: 3 - valACYclovir (VALTREX) 1000 MG tablet; Take 1 tablet (1,000 mg total) by mouth 3 (three) times daily.  Dispense: 21 tablet; Refill: 0  Patient to call early next week to check in and report on rash.    Continue diet and meds as discussed. Further disposition pending results of labs.  HPI 81 y.o. female  presents for 3 month follow up with hypertension, hyperlipidemia, prediabetes and vitamin D.   Her blood pressure has been controlled at home, today their BP is BP: 132/70 mmHg.   She does workout. She denies chest pain, shortness of breath, dizziness.   She is on cholesterol medication and denies myalgias. Her cholesterol is at goal. The cholesterol last visit was:   Lab Results  Component Value Date   CHOL 160 02/11/2015   HDL 69 02/11/2015   LDLCALC 74 02/11/2015   TRIG 84 02/11/2015   CHOLHDL 2.3 02/11/2015     She has been working on diet and exercise for prediabetes, and denies foot ulcerations, hyperglycemia, hypoglycemia , increased appetite, nausea, paresthesia of the feet,  polydipsia, polyuria, visual disturbances, vomiting and weight loss. Last A1C in the office was:  Lab Results  Component Value Date   HGBA1C 5.6 02/11/2015    Patient is on Vitamin D supplement.  Lab Results  Component Value Date   VD25OH 80 02/11/2015     Patient reports that she has a rash and sensitivity on her right butt cheek.  She reports that she has some bumps on her bottom and reports that it has been itchy and sensitive.  She reports that she noticed this this week.  She has tried taking acyclovir.  She does have a history of shingles and also a history of HSV1.   She has tried taking lidocaine and also acyclovir with some mild relief.     Current Medications:  Current Outpatient Prescriptions on File Prior to Visit  Medication Sig Dispense Refill  . acyclovir (ZOVIRAX) 400 MG tablet Take 1 tablet (400 mg total) by mouth 2 (two) times daily. 30 tablet 0  . ALPRAZolam (XANAX) 0.5 MG tablet Take 1 tablet (0.5 mg total) by mouth 3 (three) times daily as needed for sleep or anxiety. 60 tablet 1  . aspirin 81 MG chewable tablet Chew 81 mg by mouth daily.    . Cholecalciferol (VITAMIN D PO) Take 1-2 tablets by mouth See admin instructions. Vitamin d 5000 units per patient. Take twice every day except on Monday, Wednesday, and Fridays take 1 tablet    .  diphenhydramine-acetaminophen (TYLENOL PM) 25-500 MG TABS Take 1 tablet by mouth at bedtime. Take every night per patient    . loperamide (IMODIUM) 2 MG capsule Take 2 mg by mouth 2 (two) times daily. [employer] brand anti-diarrhea. Take every day per patient    . magnesium gluconate (MAGONATE) 500 MG tablet Take 500 mg by mouth daily.     . meloxicam (MOBIC) 15 MG tablet Take once daily as needed daily with food for pain, do not take aleve/ibuprofen with it, can take tylenol 90 tablet 0  . metoprolol (LOPRESSOR) 50 MG tablet TAKE ONE TABLET BY MOUTH TWICE DAILY 180 tablet 0  . simvastatin (ZOCOR) 40 MG tablet TAKE 1 TABLET BY MOUTH EVERY  NIGHT AT BEDTIME FOR CHOLESTEROL 90 tablet 0  . hydrochlorothiazide (HYDRODIURIL) 25 MG tablet TAKE 1 TABLET BY MOUTH EVERY DAY (Patient not taking: Reported on 05/13/2015) 90 tablet 3   No current facility-administered medications on file prior to visit.    Medical History:  Past Medical History  Diagnosis Date  . Hypertension   . Hyperlipidemia   . Lung cancer, upper lobe 12/04/1996    RUL  . Osteoporosis   . Thigh shingles 12/04/1996  . S/P partial lobectomy of lung     [id number]  . Vitamin D deficiency   . Thoracic aorta atherosclerosis   . COPD (chronic obstructive pulmonary disease) with emphysema     Allergies:  Allergies  Allergen Reactions  . Ace Inhibitors Cough  . Calcium-Containing Compounds Other (See Comments)    constpation  . Lactose Intolerance (Gi) Diarrhea     Review of Systems:  Review of Systems  Constitutional: Negative for fever, chills and malaise/fatigue.  HENT: Negative for congestion, sore throat and tinnitus.   Respiratory: Negative for cough, shortness of breath and wheezing.   Cardiovascular: Negative for chest pain, palpitations and leg swelling.  Gastrointestinal: Negative for nausea, vomiting, diarrhea, constipation, blood in stool and melena.  Genitourinary: Negative.   Skin: Positive for itching and rash.  Neurological: Negative for dizziness, sensory change, loss of consciousness and headaches.  Psychiatric/Behavioral: Negative for depression. The patient is not nervous/anxious and does not have insomnia.     Family history- Review and unchanged  Social history- Review and unchanged  Physical Exam: BP 132/70 mmHg  Pulse 74  Temp(Src) 98.2 F (36.8 C) (Temporal)  Resp 16  Ht 5' 2.5" (1.588 m)  Wt 133 lb (60.328 kg)  BMI 23.92 kg/m2 Wt Readings from Last 3 Encounters:  05/13/15 133 lb (60.328 kg)  02/11/15 134 lb (60.782 kg)  10/27/14 135 lb 9.6 oz (61.508 kg)    General Appearance: Well nourished well developed, in no  apparent distress. Eyes: PERRLA, EOMs, conjunctiva no swelling or erythema ENT/Mouth: Ear canals normal without obstruction, swelling, erythma, discharge.  TMs normal bilaterally.  Oropharynx moist, clear, without exudate, or postoropharyngeal swelling. Neck: Supple, thyroid normal,no cervical adenopathy  Respiratory: Respiratory effort normal, Breath sounds clear A&P without rhonchi, wheeze, or rale.  No retractions, no accessory usage. Cardio: RRR with no MRGs. Brisk peripheral pulses without edema.  Abdomen: Soft, + BS,  Non tender, no guarding, rebound, hernias, masses. Musculoskeletal: Full ROM, 5/5 strength, Normal gait Skin: Warm, dry without rashes, lesions, ecchymosis. Vessicular rash around the buttocks on the right side with scattered erythematous bases.  NO evidence of drainage.  Some mild ulceration.   Neuro: Awake and oriented X 3, Cranial nerves intact. Normal muscle tone, no cerebellar symptoms. Psych: Normal affect, Insight and Judgment appropriate.  FORCUCCI, Dywane Peruski, PA-C 4:05 PM [facility]

## 2015-05-13 NOTE — Patient Instructions (Signed)
Please start taking your simvistatin T, TH, and Sat in the evening.    Please take the valtrex 3 times daily with meals.  To help with rash.  Please take the prednisone to help with itching and inflammation.  You can use the cream to place on the area up to three times daily as needed for discomfort and itching.  Call Monday or Tuesday to let me know how the rash is doing.

## 2015-05-14 LAB — HEPATIC FUNCTION PANEL
ALBUMIN: 4.1 g/dL (ref 3.5–5.2)
ALT: 20 U/L (ref 0–35)
AST: 29 U/L (ref 0–37)
Alkaline Phosphatase: 65 U/L (ref 39–117)
BILIRUBIN DIRECT: 0.1 mg/dL (ref 0.0–0.3)
Indirect Bilirubin: 0.4 mg/dL (ref 0.2–1.2)
Total Bilirubin: 0.5 mg/dL (ref 0.2–1.2)
Total Protein: 6.6 g/dL (ref 6.0–8.3)

## 2015-05-14 LAB — LIPID PANEL
Cholesterol: 151 mg/dL (ref 0–200)
HDL: 68 mg/dL (ref >=46)
LDL CALC: 72 mg/dL (ref 0–99)
TRIGLYCERIDES: 56 mg/dL (ref <150)
Total CHOL/HDL Ratio: 2.2 Ratio
VLDL: 11 mg/dL (ref 0–40)

## 2015-05-14 LAB — BASIC METABOLIC PANEL WITH GFR
BUN: 17 mg/dL (ref 6–23)
CHLORIDE: 104 meq/L (ref 96–112)
CO2: 29 meq/L (ref 19–32)
CREATININE: 0.64 mg/dL (ref 0.50–1.10)
Calcium: 9.2 mg/dL (ref 8.4–10.5)
GFR, Est African American: >89 mL/min
GFR, Est Non African American: 85 mL/min
Glucose, Bld: 88 mg/dL (ref 70–99)
POTASSIUM: 4.2 meq/L (ref 3.5–5.3)
Sodium: 142 mEq/L (ref 135–145)

## 2015-05-14 LAB — CBC WITH DIFFERENTIAL/PLATELET
BASOS ABS: 0 10*3/uL (ref 0.0–0.1)
Basophils Relative: 0 % (ref 0–1)
EOS ABS: 0.1 10*3/uL (ref 0.0–0.7)
Eosinophils Relative: 2 % (ref 0–5)
HEMATOCRIT: 38.4 % (ref 36.0–46.0)
HEMOGLOBIN: 12.2 g/dL (ref 12.0–15.0)
LYMPHS ABS: 1.7 10*3/uL (ref 0.7–4.0)
Lymphocytes Relative: 37 % (ref 12–46)
MCH: 27.5 pg (ref 26.0–34.0)
MCHC: 31.8 g/dL (ref 30.0–36.0)
MCV: 86.5 fL (ref 78.0–100.0)
MPV: 9.7 fL (ref 8.6–12.4)
Monocytes Absolute: 0.5 10*3/uL (ref 0.1–1.0)
Monocytes Relative: 10 % (ref 3–12)
NEUTROS ABS: 2.3 10*3/uL (ref 1.7–7.7)
NEUTROS PCT: 51 % (ref 43–77)
Platelets: 158 10*3/uL (ref 150–400)
RBC: 4.44 MIL/uL (ref 3.87–5.11)
RDW: 15 % (ref 11.5–15.5)
WBC: 4.6 10*3/uL (ref 4.0–10.5)

## 2015-05-14 LAB — MAGNESIUM: MAGNESIUM: 1.9 mg/dL (ref 1.5–2.5)

## 2015-05-14 LAB — TSH: TSH: 1.422 u[IU]/mL (ref 0.350–4.500)

## 2015-05-14 LAB — VITAMIN D 25 HYDROXY (VIT D DEFICIENCY, FRACTURES): Vit D, 25-Hydroxy: 63 ng/mL (ref 30–100)

## 2015-05-14 LAB — INSULIN, RANDOM: Insulin: 1.9 u[IU]/mL — ABNORMAL LOW (ref 2.0–19.6)

## 2015-05-14 LAB — HEMOGLOBIN A1C
HEMOGLOBIN A1C: 5.8 % — AB (ref <5.7)
Mean Plasma Glucose: 120 mg/dL — ABNORMAL HIGH (ref <117)

## 2015-06-15 ENCOUNTER — Other Ambulatory Visit: Payer: Self-pay | Admitting: *Deleted

## 2015-06-15 ENCOUNTER — Encounter: Payer: Self-pay | Admitting: Internal Medicine

## 2015-06-15 ENCOUNTER — Ambulatory Visit (INDEPENDENT_AMBULATORY_CARE_PROVIDER_SITE_OTHER): Payer: Medicare Other | Admitting: Internal Medicine

## 2015-06-15 VITALS — BP 148/84 | HR 60 | Temp 97.3°F | Resp 16 | Ht 62.0 in | Wt 132.6 lb

## 2015-06-15 DIAGNOSIS — C3410 Malignant neoplasm of upper lobe, unspecified bronchus or lung: Secondary | ICD-10-CM

## 2015-06-15 DIAGNOSIS — I10 Essential (primary) hypertension: Secondary | ICD-10-CM | POA: Diagnosis not present

## 2015-06-15 DIAGNOSIS — J309 Allergic rhinitis, unspecified: Secondary | ICD-10-CM

## 2015-06-15 DIAGNOSIS — Z79899 Other long term (current) drug therapy: Secondary | ICD-10-CM

## 2015-06-15 DIAGNOSIS — Z6824 Body mass index (BMI) 24.0-24.9, adult: Secondary | ICD-10-CM

## 2015-06-15 DIAGNOSIS — E559 Vitamin D deficiency, unspecified: Secondary | ICD-10-CM | POA: Diagnosis not present

## 2015-06-15 DIAGNOSIS — E785 Hyperlipidemia, unspecified: Secondary | ICD-10-CM | POA: Diagnosis not present

## 2015-06-15 DIAGNOSIS — R7303 Prediabetes: Secondary | ICD-10-CM

## 2015-06-15 DIAGNOSIS — R7309 Other abnormal glucose: Secondary | ICD-10-CM

## 2015-06-15 DIAGNOSIS — Z9181 History of falling: Secondary | ICD-10-CM

## 2015-06-15 DIAGNOSIS — Z1331 Encounter for screening for depression: Secondary | ICD-10-CM

## 2015-06-15 DIAGNOSIS — Z1212 Encounter for screening for malignant neoplasm of rectum: Secondary | ICD-10-CM

## 2015-06-15 LAB — CBC WITH DIFFERENTIAL/PLATELET
BASOS PCT: 1 % (ref 0–1)
Basophils Absolute: 0 10*3/uL (ref 0.0–0.1)
EOS ABS: 0.1 10*3/uL (ref 0.0–0.7)
Eosinophils Relative: 2 % (ref 0–5)
HCT: 41.2 % (ref 36.0–46.0)
Hemoglobin: 13.1 g/dL (ref 12.0–15.0)
Lymphocytes Relative: 32 % (ref 12–46)
Lymphs Abs: 1.5 10*3/uL (ref 0.7–4.0)
MCH: 27.8 pg (ref 26.0–34.0)
MCHC: 31.8 g/dL (ref 30.0–36.0)
MCV: 87.5 fL (ref 78.0–100.0)
MONO ABS: 0.4 10*3/uL (ref 0.1–1.0)
MONOS PCT: 9 % (ref 3–12)
MPV: 9.6 fL (ref 8.6–12.4)
NEUTROS ABS: 2.6 10*3/uL (ref 1.7–7.7)
Neutrophils Relative %: 56 % (ref 43–77)
Platelets: 184 10*3/uL (ref 150–400)
RBC: 4.71 MIL/uL (ref 3.87–5.11)
RDW: 14.9 % (ref 11.5–15.5)
WBC: 4.6 10*3/uL (ref 4.0–10.5)

## 2015-06-15 LAB — BASIC METABOLIC PANEL WITH GFR
BUN: 22 mg/dL (ref 6–23)
CALCIUM: 9.2 mg/dL (ref 8.4–10.5)
CO2: 29 meq/L (ref 19–32)
Chloride: 103 mEq/L (ref 96–112)
Creat: 0.63 mg/dL (ref 0.50–1.10)
GFR, EST NON AFRICAN AMERICAN: 85 mL/min
Glucose, Bld: 85 mg/dL (ref 70–99)
Potassium: 4.2 mEq/L (ref 3.5–5.3)
Sodium: 142 mEq/L (ref 135–145)

## 2015-06-15 LAB — LIPID PANEL
CHOLESTEROL: 187 mg/dL (ref 0–200)
HDL: 70 mg/dL (ref >=46)
LDL CALC: 106 mg/dL — AB (ref 0–99)
Total CHOL/HDL Ratio: 2.7 Ratio
Triglycerides: 56 mg/dL (ref <150)
VLDL: 11 mg/dL (ref 0–40)

## 2015-06-15 LAB — HEPATIC FUNCTION PANEL
ALT: 14 U/L (ref 0–35)
AST: 20 U/L (ref 0–37)
Albumin: 4.1 g/dL (ref 3.5–5.2)
Alkaline Phosphatase: 74 U/L (ref 39–117)
BILIRUBIN TOTAL: 0.4 mg/dL (ref 0.2–1.2)
Bilirubin, Direct: 0.1 mg/dL (ref 0.0–0.3)
Indirect Bilirubin: 0.3 mg/dL (ref 0.2–1.2)
Total Protein: 6.4 g/dL (ref 6.0–8.3)

## 2015-06-15 LAB — TSH: TSH: 1.186 u[IU]/mL (ref 0.350–4.500)

## 2015-06-15 LAB — MAGNESIUM: Magnesium: 1.8 mg/dL (ref 1.5–2.5)

## 2015-06-15 MED ORDER — MONTELUKAST SODIUM 10 MG PO TABS: ORAL_TABLET | ORAL | Status: DC

## 2015-06-15 MED ORDER — HYDROCHLOROTHIAZIDE 25 MG PO TABS: 25.0000 mg | ORAL_TABLET | Freq: Every day | ORAL | Status: DC

## 2015-06-15 NOTE — Patient Instructions (Signed)
Recommend Adult Low dose Aspirin or coated  Aspirin 81 mg daily   To reduce risk of Colon Cancer 20 %,   Skin Cancer 26 % ,   Melanoma 46%   and   Pancreatic cancer 60% ++++++++++++++++++ Vitamin D goal is between 70-100.   Please make sure that you are taking your Vitamin D as directed.   It is very important as a natural anti-inflammatory   helping hair, skin, and nails, as well as reducing stroke and heart attack risk.   It helps your bones and helps with mood.  It also decreases numerous cancer risks so please take it as directed.   Low Vit D is associated with a 200-300% higher risk for CANCER   and 200-300% higher risk for HEART   ATTACK  &  STROKE.   ......................................  It is also associated with higher death rate at younger ages,   autoimmune diseases like Rheumatoid arthritis, Lupus, Multiple Sclerosis.     Also many other serious conditions, like depression, Alzheimer's  Dementia, infertility, muscle aches, fatigue, fibromyalgia - just to name a few.  +++++++++++++++++++  Recommend the book "The END of DIETING" by Dr Joel Fuhrman   & the book "The END of DIABETES " by Dr Joel Fuhrman  At [employer] - get book & Audio CD's     Being diabetic has a  300% increased risk for heart attack, stroke, cancer, and alzheimer- type vascular dementia. It is very important that you work harder with diet by avoiding all foods that are white. Avoid white rice (brown & wild rice is OK), white potatoes (sweetpotatoes in moderation is OK), White bread or wheat bread or anything made out of white flour like bagels, donuts, rolls, buns, biscuits, cakes, pastries, cookies, pizza crust, and pasta (made from white flour & egg whites) - vegetarian pasta or spinach or wheat pasta is OK. Multigrain breads like Arnold's or [employer], or multigrain sandwich thins or flatbreads.  Diet, exercise and weight loss can reverse and cure diabetes in the early stages.   Diet, exercise and weight loss is very important in the control and prevention of complications of diabetes which affects every system in your body, ie. Brain - dementia/stroke, eyes - glaucoma/blindness, heart - heart attack/heart failure, kidneys - dialysis, stomach - gastric paralysis, intestines - malabsorption, nerves - severe painful neuritis, circulation - gangrene & loss of a leg(s), and finally cancer and Alzheimers.    I recommend avoid fried & greasy foods,  sweets/candy, white rice (brown or wild rice or Quinoa is OK), white potatoes (sweet potatoes are OK) - anything made from white flour - bagels, doughnuts, rolls, buns, biscuits,white and wheat breads, pizza crust and traditional pasta made of white flour & egg white(vegetarian pasta or spinach or wheat pasta is OK).  Multi-grain bread is OK - like multi-grain flat bread or sandwich thins. Avoid alcohol in excess. Exercise is also important.    Eat all the vegetables you want - avoid meat, especially red meat and dairy - especially cheese.  Cheese is the most concentrated form of trans-fats which is the worst thing to clog up our arteries. Veggie cheese is OK which can be found in the fresh produce section at [employer] or [employer] or [employer]  ++++++++++++++++++++++++++   Preventive Care for Adults  A healthy lifestyle and preventive care can promote health and wellness. Preventive health guidelines for women include the following key practices.  A routine yearly physical is a good way   to check with your health care provider about your health and preventive screening. It is a chance to share any concerns and updates on your health and to receive a thorough exam.  Visit your dentist for a routine exam and preventive care every 6 months. Brush your teeth twice a day and floss once a day. Good oral hygiene prevents tooth decay and gum disease.  The frequency of eye exams is based on your age, health, family medical history, use  of contact lenses, and other factors. Follow your health care provider's recommendations for frequency of eye exams.  Eat a healthy diet. Foods like vegetables, fruits, whole grains, low-fat dairy products, and lean protein foods contain the nutrients you need without too many calories. Decrease your intake of foods high in solid fats, added sugars, and salt. Eat the right amount of calories for you.Get information about a proper diet from your health care provider, if necessary.  Regular physical exercise is one of the most important things you can do for your health. Most adults should get at least 150 minutes of moderate-intensity exercise (any activity that increases your heart rate and causes you to sweat) each week. In addition, most adults need muscle-strengthening exercises on 2 or more days a week.  Maintain a healthy weight. The body mass index (BMI) is a screening tool to identify possible weight problems. It provides an estimate of body fat based on height and weight. Your health care provider can find your BMI and can help you achieve or maintain a healthy weight.For adults 20 years and older:  A BMI below 18.5 is considered underweight.  A BMI of 18.5 to 24.9 is normal.  A BMI of 25 to 29.9 is considered overweight.  A BMI of 30 and above is considered obese.  Maintain normal blood lipids and cholesterol levels by exercising and minimizing your intake of saturated fat. Eat a balanced diet with plenty of fruit and vegetables. If your lipid or cholesterol levels are high, you are over 50, or you are at high risk for heart disease, you may need your cholesterol levels checked more frequently.Ongoing high lipid and cholesterol levels should be treated with medicines if diet and exercise are not working.  If you smoke, find out from your health care provider how to quit. If you do not use tobacco, do not start.  Lung cancer screening is recommended for adults aged 17-80 years who are  at high risk for developing lung cancer because of a history of smoking. A yearly low-dose CT scan of the lungs is recommended for people who have at least a 30-pack-year history of smoking and are a current smoker or have quit within the past 15 years. A pack year of smoking is smoking an average of 1 pack of cigarettes a day for 1 year (for example: 1 pack a day for 30 years or 2 packs a day for 15 years). Yearly screening should continue until the smoker has stopped smoking for at least 15 years. Yearly screening should be stopped for people who develop a health problem that would prevent them from having lung cancer treatment.  Avoid use of street drugs. Do not share needles with anyone. Ask for help if you need support or instructions about stopping the use of drugs.  High blood pressure causes heart disease and increases the risk of stroke.  Ongoing high blood pressure should be treated with medicines if weight loss and exercise do not work.  If you are 55-79  years old, ask your health care provider if you should take aspirin to prevent strokes.  Diabetes screening involves taking a blood sample to check your fasting blood sugar level. This should be done once every 3 years, after age 3, if you are within normal weight and without risk factors for diabetes. Testing should be considered at a younger age or be carried out more frequently if you are overweight and have at least 1 risk factor for diabetes.  Breast cancer screening is essential preventive care for women. You should practice "breast self-awareness." This means understanding the normal appearance and feel of your breasts and may include breast self-examination. Any changes detected, no matter how Arman, should be reported to a health care provider. Women in their 10s and 30s should have a clinical breast exam (CBE) by a health care provider as part of a regular health exam every 1 to 3 years. After age 67, women should have a CBE every  year. Starting at age 66, women should consider having a mammogram (breast X-ray test) every year. Women who have a family history of breast cancer should talk to their health care provider about genetic screening. Women at a high risk of breast cancer should talk to their health care providers about having an MRI and a mammogram every year.  Breast cancer gene (BRCA)-related cancer risk assessment is recommended for women who have family members with BRCA-related cancers. BRCA-related cancers include breast, ovarian, tubal, and peritoneal cancers. Having family members with these cancers may be associated with an increased risk for harmful changes (mutations) in the breast cancer genes BRCA1 and BRCA2. Results of the assessment will determine the need for genetic counseling and BRCA1 and BRCA2 testing.  Routine pelvic exams to screen for cancer are no longer recommended for nonpregnant women who are considered low risk for cancer of the pelvic organs (ovaries, uterus, and vagina) and who do not have symptoms. Ask your health care provider if a screening pelvic exam is right for you.  If you have had past treatment for cervical cancer or a condition that could lead to cancer, you need Pap tests and screening for cancer for at least 20 years after your treatment. If Pap tests have been discontinued, your risk factors (such as having a new sexual partner) need to be reassessed to determine if screening should be resumed. Some women have medical problems that increase the chance of getting cervical cancer. In these cases, your health care provider may recommend more frequent screening and Pap tests.    Colorectal cancer can be detected and often prevented. Most routine colorectal cancer screening begins at the age of 97 years and continues through age 68 years. However, your health care provider may recommend screening at an earlier age if you have risk factors for colon cancer. On a yearly basis, your health  care provider may provide home test kits to check for hidden blood in the stool. Use of a Deems camera at the end of a tube, to directly examine the colon (sigmoidoscopy or colonoscopy), can detect the earliest forms of colorectal cancer. Talk to your health care provider about this at age 65, when routine screening begins. Direct exam of the colon should be repeated every 5-10 years through age 81 years, unless early forms of pre-cancerous polyps or Sthilaire growths are found.  Osteoporosis is a disease in which the bones lose minerals and strength with aging. This can result in serious bone fractures or breaks. The risk of osteoporosis  can be identified using a bone density scan. Women ages 13 years and over and women at risk for fractures or osteoporosis should discuss screening with their health care providers. Ask your health care provider whether you should take a calcium supplement or vitamin D to reduce the rate of osteoporosis.  Menopause can be associated with physical symptoms and risks. Hormone replacement therapy is available to decrease symptoms and risks. You should talk to your health care provider about whether hormone replacement therapy is right for you.  Use sunscreen. Apply sunscreen liberally and repeatedly throughout the day. You should seek shade when your shadow is shorter than you. Protect yourself by wearing long sleeves, pants, a wide-brimmed hat, and sunglasses year round, whenever you are outdoors.  Once a month, do a whole body skin exam, using a mirror to look at the skin on your back. Tell your health care provider of new moles, moles that have irregular borders, moles that are larger than a pencil eraser, or moles that have changed in shape or color.  Stay current with required vaccines (immunizations).  Influenza vaccine. All adults should be immunized every year.  Tetanus, diphtheria, and acellular pertussis (Td, Tdap) vaccine. Pregnant women should receive 1 dose of  Tdap vaccine during each pregnancy. The dose should be obtained regardless of the length of time since the last dose. Immunization is preferred during the 27th-36th week of gestation. An adult who has not previously received Tdap or who does not know her vaccine status should receive 1 dose of Tdap. This initial dose should be followed by tetanus and diphtheria toxoids (Td) booster doses every 10 years. Adults with an unknown or incomplete history of completing a 3-dose immunization series with Td-containing vaccines should begin or complete a primary immunization series including a Tdap dose. Adults should receive a Td booster every 10 years.    Zoster vaccine. One dose is recommended for adults aged 35 years or older unless certain conditions are present.    Pneumococcal 13-valent conjugate (PCV13) vaccine. When indicated, a person who is uncertain of her immunization history and has no record of immunization should receive the PCV13 vaccine. An adult aged 71 years or older who has certain medical conditions and has not been previously immunized should receive 1 dose of PCV13 vaccine. This PCV13 should be followed with a dose of pneumococcal polysaccharide (PPSV23) vaccine. The PPSV23 vaccine dose should be obtained at least 8 weeks after the dose of PCV13 vaccine. An adult aged 63 years or older who has certain medical conditions and previously received 1 or more doses of PPSV23 vaccine should receive 1 dose of PCV13. The PCV13 vaccine dose should be obtained 1 or more years after the last PPSV23 vaccine dose.    Pneumococcal polysaccharide (PPSV23) vaccine. When PCV13 is also indicated, PCV13 should be obtained first. All adults aged 40 years and older should be immunized. An adult younger than age 62 years who has certain medical conditions should be immunized. Any person who resides in a nursing home or long-term care facility should be immunized. An adult smoker should be immunized. People with an  immunocompromised condition and certain other conditions should receive both PCV13 and PPSV23 vaccines. People with human immunodeficiency virus (HIV) infection should be immunized as soon as possible after diagnosis. Immunization during chemotherapy or radiation therapy should be avoided. Routine use of PPSV23 vaccine is not recommended for American Indians, [place] Natives, or people younger than 65 years unless there are medical conditions that require  PPSV23 vaccine. When indicated, people who have unknown immunization and have no record of immunization should receive PPSV23 vaccine. One-time revaccination 5 years after the first dose of PPSV23 is recommended for people aged 19-64 years who have chronic kidney failure, nephrotic syndrome, asplenia, or immunocompromised conditions. People who received 1-2 doses of PPSV23 before age 6 years should receive another dose of PPSV23 vaccine at age 81 years or later if at least 5 years have passed since the previous dose. Doses of PPSV23 are not needed for people immunized with PPSV23 at or after age 25 years.   Preventive Services / Frequency  Ages 65 years and over  Blood pressure check.  Lipid and cholesterol check.  Lung cancer screening. / Every year if you are aged 72-80 years and have a 30-pack-year history of smoking and currently smoke or have quit within the past 15 years. Yearly screening is stopped once you have quit smoking for at least 15 years or develop a health problem that would prevent you from having lung cancer treatment.  Clinical breast exam.** / Every year after age 28 years.  BRCA-related cancer risk assessment.** / For women who have family members with a BRCA-related cancer (breast, ovarian, tubal, or peritoneal cancers).  Mammogram.** / Every year beginning at age 67 years and continuing for as long as you are in good health. Consult with your health care provider.  Pap test.** / Every 3 years starting at age 44 years  through age 38 or 31 years with 3 consecutive normal Pap tests. Testing can be stopped between 65 and 70 years with 3 consecutive normal Pap tests and no abnormal Pap or HPV tests in the past 10 years.  Fecal occult blood test (FOBT) of stool. / Every year beginning at age 97 years and continuing until age 31 years. You may not need to do this test if you get a colonoscopy every 10 years.  Flexible sigmoidoscopy or colonoscopy.** / Every 5 years for a flexible sigmoidoscopy or every 10 years for a colonoscopy beginning at age 3 years and continuing until age 60 years.  Hepatitis C blood test.** / For all people born from [birth year] through 1963 and any individual with known risks for hepatitis C.  Osteoporosis screening.** / A one-time screening for women ages 44 years and over and women at risk for fractures or osteoporosis.  Skin self-exam. / Monthly.  Influenza vaccine. / Every year.  Tetanus, diphtheria, and acellular pertussis (Tdap/Td) vaccine.** / 1 dose of Td every 10 years.  Zoster vaccine.** / 1 dose for adults aged 72 years or older.  Pneumococcal 13-valent conjugate (PCV13) vaccine.** / Consult your health care provider.  Pneumococcal polysaccharide (PPSV23) vaccine.** / 1 dose for all adults aged 64 years and older. Screening for abdominal aortic aneurysm (AAA)  by ultrasound is recommended for people who have history of high blood pressure or who are current or former smokers.

## 2015-06-15 NOTE — Progress Notes (Signed)
Patient ID: Penny [name], female   DOB: 12/31/1932, 81 y.o.   MRN: [id number]  Annual Comprehensive Examination  This very nice 81 y.o. [facility] presents for complete physical.  Patient has been followed for HTN, Prediabetes, Hyperlipidemia and Vitamin D Deficiency.    HTN predates since 2000. Patient's BP has been controlled at home and patient denies any cardiac symptoms as chest pain, palpitations, shortness of breath, dizziness or ankle swelling. Today's BP is 148/84 and dropped to 136/ 82 on recheck.    Patient's hyperlipidemia is controlled with diet and medications. Patient denies myalgias or other medication SE's. Last lipids were at goal -  Cholesterol 151; HDL 68; LDL 72; Trig 56 on 05/13/2015.   Patient has prediabetes predating since with A1c 5.9% in 2009 and patient denies reactive hypoglycemic symptoms, visual blurring, diabetic polys, or paresthesias. Last A1c was  5.8% on6/08/2015.       Finally, patient has history of Vitamin D Deficiency of 26 in 2008 and last Vitamin D was 63 on 05/13/2015.      Medication Sig  . acyclovir  400 MG tablet Take 1 tablet  2  times daily.  Marland Kitchen ALPRAZolam  0.5 MG tablet Take 1 tab 3  times daily as needed for sleep or anxiety.  Marland Kitchen aspirin 81 MG chewable tab Chewdaily.  Marland Kitchen VITAMIN D   Vitamin d 5000 units twice every day except on Monday, Wednesday, and Fridays take 1 tablet  . diphenhydramine-APAP  25-500  Take 1 tab at bedtime.   . hctz 25 MG tab TAKE 1 TAB EVERY DAY  . Lidocaine-Hydrocortisone Ace 2-2 % KIT Use 2-3 times daily as needed for rash and rectal discomfort.  . loperamide (IMODIUM) 2 MG capsule Take 2 mg 2 x daily. [employer] brand . Take every day per patient  . magnesium gluconate  500 MG  Take  daily.   . meloxicam 15 MG tablet Take once daily as needed daily with food f  . metoprolol tartrate 50 MG tablet TAKE ONE TAB TWICE DAILY  . simvastatin  40 MG tablet TAKE 1 TAB EVERY NIGHT    Allergies  Allergen Reactions  . Ace Inhibitors Cough  .  Calcium-Containing Compounds Other (See Comments)    constpation  . Lactose Intolerance (Gi) Diarrhea   Past Medical History  Diagnosis Date  . Hypertension   . Hyperlipidemia   . Lung cancer, upper lobe 12/04/1996    RUL  . Osteoporosis   . Thigh shingles 12/04/1996  . S/P partial lobectomy of lung     [id number]  . Vitamin D deficiency   . Thoracic aorta atherosclerosis   . COPD (chronic obstructive pulmonary disease) with emphysema    Health Maintenance  Topic Date Due  . ZOSTAVAX  06/11/1995  . INFLUENZA VACCINE  07/05/2015  . PNA vac Low Risk Adult (2 of 2 - PPSV23) 07/22/2015  . TETANUS/TDAP  04/04/2023  . COLONOSCOPY  09/30/2023  . DEXA SCAN  Completed   Immunization History  Administered Date(s) Administered  . Influenza-Unspecified 08/04/2012, 09/03/2013  . Pneumococcal Conjugate-13 12/04/2004, 07/21/2014  . Td 04/03/2013   Past Surgical History  Procedure Laterality Date  . Carpal tunnel release Right     2009  . Shoulder adhesion release Right     2004  . Lung removal, partial Right     RUL 1998   Family History  Problem Relation Age of Onset  . Heart disease Mother   . Lung cancer Father  History  Substance Use Topics  . Smoking status: Former Smoker    Quit date: 12/04/1996  . Smokeless tobacco: Never Used  . Alcohol Use: 0.6 oz/week    1 Glasses of wine per week    ROS Constitutional: Denies fever, chills, weight loss/gain, headaches, insomnia,  night sweats, and change in appetite. Does c/o fatigue. Eyes: Denies redness, blurred vision, diplopia, discharge, itchy, watery eyes.  ENT: Denies discharge, congestion, post nasal drip, epistaxis, sore throat, earache, hearing loss, dental pain, Tinnitus, Vertigo, Sinus pain, snoring.  Cardio: Denies chest pain, palpitations, irregular heartbeat, syncope, dyspnea, diaphoresis, orthopnea, PND, claudication, edema Respiratory: denies cough, dyspnea, DOE, pleurisy, hoarseness, laryngitis, wheezing.   Gastrointestinal: Denies dysphagia, heartburn, reflux, water brash, pain, cramps, nausea, vomiting, bloating, diarrhea, constipation, hematemesis, melena, hematochezia, jaundice, hemorrhoids Genitourinary: Denies dysuria, frequency, urgency, nocturia, hesitancy, discharge, hematuria, flank pain Breast: Breast lumps, nipple discharge, bleeding.  Musculoskeletal: Denies arthralgia, myalgia, stiffness, Jt. Swelling, pain, limp, and strain/sprain. Denies falls. Skin: Denies puritis, rash, hives, warts, acne, eczema, changing in skin lesion Neuro: No weakness, tremor, incoordination, spasms, paresthesia, pain Psychiatric: Denies confusion, memory loss, sensory loss. Denies Depression. Endocrine: Denies change in weight, skin, hair change, nocturia, and paresthesia, diabetic polys, visual blurring, hyper / hypo glycemic episodes.  Heme/Lymph: No excessive bleeding, bruising, enlarged lymph nodes.  Physical Exam  BP 148/84   Pulse 60  Temp 97.3 F   Resp 16  Ht $R'5\' 2"'Op$    Wt 132 lb 9.6 oz     BMI 24.25   General Appearance: Well nourished and in no apparent distress. Eyes: PERRLA, EOMs, conjunctiva no swelling or erythema, normal fundi and vessels. Sinuses: No frontal/maxillary tenderness ENT/Mouth: EACs patent / TMs  nl. Nares clear without erythema, swelling, mucoid exudates. Oral hygiene is good. No erythema, swelling, or exudate. Tongue normal, non-obstructing. Tonsils not swollen or erythematous. Hearing normal.  Neck: Supple, thyroid normal. No bruits, nodes or JVD. Respiratory: Respiratory effort normal.  BS equal and clear bilateral without rales, rhonci, wheezing or stridor. Cardio: Heart sounds are normal with regular rate and rhythm and no murmurs, rubs or gallops. Peripheral pulses are normal and equal bilaterally without edema. No aortic or femoral bruits. Chest: symmetric with normal excursions and percussion. Breasts: Symmetric, without lumps, nipple discharge, retractions, or  fibrocystic changes.  Abdomen: Flat, soft, with bowel sounds. Nontender, no guarding, rebound, hernias, masses, or organomegaly.  Lymphatics: Non tender without lymphadenopathy.  Genitourinary:  Musculoskeletal: Full ROM all peripheral extremities, joint stability, 5/5 strength, and normal gait. Skin: Warm and dry without rashes, lesions, cyanosis, clubbing or  ecchymosis.  Neuro: Cranial nerves intact, reflexes equal bilaterally. Normal muscle tone, no cerebellar symptoms. Sensation intact.  Pysch: Awake and oriented X 3, normal affect, Insight and Judgment appropriate.   Assessment and Plan  1. Essential hypertension  - Microalbumin / creatinine urine ratio - EKG 12-Lead - [place], RETROPERITNL ABD,  LTD - TSH  2. Hyperlipidemia  - Lipid panel  3. Prediabetes  - Hemoglobin A1c - Insulin, random  4. Vitamin D deficiency  - Vit D  25 hydroxy   5. 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 Medication management  - Urine Microscopic - CBC with Differential/Platelet - BASIC METABOLIC PANEL WITH GFR - Hepatic function panel - Magnesium  7. Screening for rectal cancer  - POC Hemoccult Bld/Stl    Continue prudent diet as discussed, weight control, BP monitoring, regular exercise, and medications. Discussed med's effects and SE's. Screening labs and tests  as requested with regular follow-up as recommended.  Over 40 minutes of exam, counseling, chart review was performed.

## 2015-06-16 ENCOUNTER — Ambulatory Visit (HOSPITAL_COMMUNITY)
Admission: RE | Admit: 2015-06-16 | Discharge: 2015-06-16 | Disposition: A | Discharge status: Home / Self Care | Payer: Medicare Other | Source: Ambulatory Visit | Attending: Internal Medicine | Admitting: Internal Medicine

## 2015-06-16 DIAGNOSIS — R0602 Shortness of breath: Secondary | ICD-10-CM | POA: Insufficient documentation

## 2015-06-16 DIAGNOSIS — J439 Emphysema, unspecified: Secondary | ICD-10-CM | POA: Diagnosis not present

## 2015-06-16 DIAGNOSIS — I10 Essential (primary) hypertension: Secondary | ICD-10-CM

## 2015-06-16 DIAGNOSIS — J449 Chronic obstructive pulmonary disease, unspecified: Secondary | ICD-10-CM | POA: Insufficient documentation

## 2015-06-16 LAB — URINALYSIS, MICROSCOPIC ONLY
Bacteria, UA: NONE SEEN
Casts: NONE SEEN
Crystals: NONE SEEN
SQUAMOUS EPITHELIAL / LPF: NONE SEEN

## 2015-06-16 LAB — MICROALBUMIN / CREATININE URINE RATIO
Creatinine, Urine: 39.9 mg/dL
MICROALB/CREAT RATIO: 37.6 mg/g — AB (ref 0.0–30.0)
Microalb, Ur: 1.5 mg/dL (ref <2.0)

## 2015-06-16 LAB — VITAMIN D 25 HYDROXY (VIT D DEFICIENCY, FRACTURES): Vit D, 25-Hydroxy: 69 ng/mL (ref 30–100)

## 2015-06-16 LAB — HEMOGLOBIN A1C
Hgb A1c MFr Bld: 5.8 % — ABNORMAL HIGH (ref <5.7)
MEAN PLASMA GLUCOSE: 120 mg/dL — AB (ref <117)

## 2015-06-16 LAB — INSULIN, RANDOM: INSULIN: 2.9 u[IU]/mL (ref 2.0–19.6)

## 2015-08-05 ENCOUNTER — Other Ambulatory Visit: Payer: Self-pay | Admitting: Internal Medicine

## 2015-09-15 ENCOUNTER — Ambulatory Visit (INDEPENDENT_AMBULATORY_CARE_PROVIDER_SITE_OTHER): Payer: Medicare Other | Admitting: Internal Medicine

## 2015-09-15 ENCOUNTER — Encounter: Payer: Self-pay | Admitting: Internal Medicine

## 2015-09-15 VITALS — BP 132/74 | HR 64 | Temp 97.9°F | Resp 16 | Ht 62.0 in | Wt 136.0 lb

## 2015-09-15 DIAGNOSIS — I10 Essential (primary) hypertension: Secondary | ICD-10-CM | POA: Diagnosis not present

## 2015-09-15 DIAGNOSIS — R7303 Prediabetes: Secondary | ICD-10-CM | POA: Diagnosis not present

## 2015-09-15 DIAGNOSIS — Z23 Encounter for immunization: Secondary | ICD-10-CM | POA: Diagnosis not present

## 2015-09-15 DIAGNOSIS — E785 Hyperlipidemia, unspecified: Secondary | ICD-10-CM | POA: Diagnosis not present

## 2015-09-15 DIAGNOSIS — E559 Vitamin D deficiency, unspecified: Secondary | ICD-10-CM

## 2015-09-15 DIAGNOSIS — Z79899 Other long term (current) drug therapy: Secondary | ICD-10-CM

## 2015-09-15 LAB — HEPATIC FUNCTION PANEL
ALBUMIN: 4.3 g/dL (ref 3.6–5.1)
ALK PHOS: 68 U/L (ref 33–130)
ALT: 15 U/L (ref 6–29)
AST: 25 U/L (ref 10–35)
BILIRUBIN DIRECT: 0.1 mg/dL (ref <=0.2)
BILIRUBIN INDIRECT: 0.6 mg/dL (ref 0.2–1.2)
BILIRUBIN TOTAL: 0.7 mg/dL (ref 0.2–1.2)
Total Protein: 6.7 g/dL (ref 6.1–8.1)

## 2015-09-15 LAB — CBC WITH DIFFERENTIAL/PLATELET
Basophils Absolute: 0.1 10*3/uL (ref 0.0–0.1)
Basophils Relative: 1 % (ref 0–1)
EOS ABS: 0.2 10*3/uL (ref 0.0–0.7)
EOS PCT: 3 % (ref 0–5)
HCT: 40.9 % (ref 36.0–46.0)
Hemoglobin: 13.2 g/dL (ref 12.0–15.0)
Lymphocytes Relative: 29 % (ref 12–46)
Lymphs Abs: 1.6 10*3/uL (ref 0.7–4.0)
MCH: 27.8 pg (ref 26.0–34.0)
MCHC: 32.3 g/dL (ref 30.0–36.0)
MCV: 86.1 fL (ref 78.0–100.0)
MPV: 9.9 fL (ref 8.6–12.4)
Monocytes Absolute: 0.7 10*3/uL (ref 0.1–1.0)
Monocytes Relative: 12 % (ref 3–12)
Neutro Abs: 3.1 10*3/uL (ref 1.7–7.7)
Neutrophils Relative %: 55 % (ref 43–77)
Platelets: 190 10*3/uL (ref 150–400)
RBC: 4.75 MIL/uL (ref 3.87–5.11)
RDW: 15.4 % (ref 11.5–15.5)
WBC: 5.6 10*3/uL (ref 4.0–10.5)

## 2015-09-15 LAB — BASIC METABOLIC PANEL WITH GFR
BUN: 14 mg/dL (ref 7–25)
CHLORIDE: 101 mmol/L (ref 98–110)
CO2: 34 mmol/L — AB (ref 20–31)
Calcium: 9.5 mg/dL (ref 8.6–10.4)
Creat: 0.69 mg/dL (ref 0.60–0.88)
GFR, EST NON AFRICAN AMERICAN: 82 mL/min (ref >=60)
GFR, Est African American: >89 mL/min (ref >=60)
Glucose, Bld: 99 mg/dL (ref 65–99)
POTASSIUM: 3.8 mmol/L (ref 3.5–5.3)
SODIUM: 142 mmol/L (ref 135–146)

## 2015-09-15 MED ORDER — ACYCLOVIR 400 MG PO TABS
400.0000 mg | ORAL_TABLET | Freq: Three times a day (TID) | ORAL | Status: DC
Start: 2015-09-15 — End: 2018-02-20

## 2015-09-15 NOTE — Progress Notes (Signed)
Patient ID: Penny [name], female   DOB: [birth year], 81 y.o.   MRN: [id number]  Assessment and Plan:  Hypertension:  -Continue medication,  -monitor blood pressure at home.  -Continue DASH diet.   -Reminder to go to the ER if any CP, SOB, nausea, dizziness, severe HA, changes vision/speech, left arm numbness and tingling, and jaw pain.  Cholesterol: -Continue diet and exercise.  -Check cholesterol.   Pre-diabetes: -Continue diet and exercise.  -Check A1C  Vitamin D Def: -check level -continue medications.   HSV -acyclovir refilled  Continue diet and meds as discussed. Further disposition pending results of labs.  HPI 81 y.o. female  presents for 3 month follow up with hypertension, hyperlipidemia, prediabetes and vitamin D.   Her blood pressure has been controlled at home, today their BP is BP: 132/74 mmHg.   She does workout. She denies chest pain, shortness of breath, dizziness.   She is on cholesterol medication and denies myalgias. Her cholesterol is at goal. The cholesterol last visit was:   Lab Results  Component Value Date   CHOL 187 06/15/2015   HDL 70 06/15/2015   LDLCALC 106* 06/15/2015   TRIG 56 06/15/2015   CHOLHDL 2.7 06/15/2015     She has been working on diet and exercise for prediabetes, and denies foot ulcerations, hyperglycemia, hypoglycemia , increased appetite, nausea, paresthesia of the feet, polydipsia, polyuria, visual disturbances, vomiting and weight loss. Last A1C in the office was:  Lab Results  Component Value Date   HGBA1C 5.8* 06/15/2015    Patient is on Vitamin D supplement.  Lab Results  Component Value Date   VD25OH 69 06/15/2015     Patient reports that she does need a refill today on her acyclovir as she takes it when she is having mouth lesion outbreaks and it helps.  Current Medications:  Current Outpatient Prescriptions on File Prior to Visit  Medication Sig Dispense Refill  . acyclovir (ZOVIRAX) 400 MG tablet Take 1 tablet  (400 mg total) by mouth 2 (two) times daily. 30 tablet 0  . ALPRAZolam (XANAX) 0.5 MG tablet Take 1 tablet (0.5 mg total) by mouth 3 (three) times daily as needed for sleep or anxiety. 60 tablet 1  . aspirin 81 MG chewable tablet Chew 81 mg by mouth daily.    . Cholecalciferol (VITAMIN D PO) Take 1-2 tablets by mouth See admin instructions. Vitamin d 5000 units per patient. Take twice every day except on Monday, Wednesday, and Fridays take 1 tablet    . diphenhydramine-acetaminophen (TYLENOL PM) 25-500 MG TABS Take 1 tablet by mouth at bedtime. Take every night per patient    . hydrochlorothiazide (HYDRODIURIL) 25 MG tablet Take 1 tablet (25 mg total) by mouth daily. 90 tablet 3  . Lidocaine-Hydrocortisone Ace 2-2 % KIT Use 2-3 times daily as needed for rash and rectal discomfort. 1 each 3  . loperamide (IMODIUM) 2 MG capsule Take 2 mg by mouth 2 (two) times daily. [employer] brand anti-diarrhea. Take every day per patient    . magnesium gluconate (MAGONATE) 500 MG tablet Take 500 mg by mouth daily.     . meloxicam (MOBIC) 15 MG tablet Take once daily as needed daily with food for pain, do not take aleve/ibuprofen with it, can take tylenol 90 tablet 0  . metoprolol (LOPRESSOR) 50 MG tablet TAKE 1 TABLET BY MOUTH TWICE DAILY 180 tablet 1  . montelukast (SINGULAIR) 10 MG tablet Take 1 tablet daily for allergies 30 tablet 99  .  simvastatin (ZOCOR) 40 MG tablet TAKE 1 TABLET BY MOUTH EVERY NIGHT AT BEDTIME FOR CHOLESTEROL 90 tablet 1   No current facility-administered medications on file prior to visit.    Medical History:  Past Medical History  Diagnosis Date  . Hypertension   . Hyperlipidemia   . Lung cancer, upper lobe ([place]) 12/04/1996    RUL  . Osteoporosis   . Thigh shingles 12/04/1996  . S/P partial lobectomy of lung     [id number]  . Vitamin D deficiency   . Thoracic aorta atherosclerosis ([place])   . COPD (chronic obstructive pulmonary disease) with emphysema ([facility])     Allergies:   Allergies  Allergen Reactions  . Ace Inhibitors Cough  . Calcium-Containing Compounds Other (See Comments)    constpation  . Lactose Intolerance (Gi) Diarrhea     Review of Systems:  Review of Systems  Constitutional: Negative for fever, chills and malaise/fatigue.  HENT: Positive for congestion. Negative for ear pain and sore throat.   Eyes: Negative.   Respiratory: Negative for cough, shortness of breath and wheezing.   Cardiovascular: Negative for chest pain, palpitations and leg swelling.  Gastrointestinal: Negative for heartburn, diarrhea, constipation, blood in stool and melena.  Genitourinary: Negative.   Skin: Negative.   Neurological: Negative for dizziness, sensory change and headaches.  Psychiatric/Behavioral: Negative for depression. The patient is not nervous/anxious.     Family history- Review and unchanged  Social history- Review and unchanged  Physical Exam: BP 132/74 mmHg  Pulse 64  Temp(Src) 97.9 F (36.6 C)  Resp 16  Ht $R'5\' 2"'Ps$  (1.575 m)  Wt 136 lb (61.689 kg)  BMI 24.87 kg/m2 Wt Readings from Last 3 Encounters:  09/15/15 136 lb (61.689 kg)  06/15/15 132 lb 9.6 oz (60.147 kg)  05/13/15 133 lb (60.328 kg)    General Appearance: Well nourished well developed, in no apparent distress. Eyes: PERRLA, EOMs, conjunctiva no swelling or erythema ENT/Mouth: Ear canals normal without obstruction, swelling, erythma, discharge.  TMs normal bilaterally.  Oropharynx moist, clear, without exudate, or postoropharyngeal swelling. Neck: Supple, thyroid normal,no cervical adenopathy  Respiratory: Respiratory effort normal, Breath sounds clear A&P without rhonchi, wheeze, or rale.  No retractions, no accessory usage. Cardio: RRR with no MRGs. Brisk peripheral pulses without edema.  Abdomen: Soft, + BS,  Non tender, no guarding, rebound, hernias, masses. Musculoskeletal: Full ROM, 5/5 strength, Normal gait Skin: Warm, dry without rashes, lesions, ecchymosis.  Neuro:  Awake and oriented X 3, Cranial nerves intact. Normal muscle tone, no cerebellar symptoms. Psych: Normal affect, Insight and Judgment appropriate.    Starlyn Skeans, PA-C 11:44 AM [facility]

## 2015-11-30 ENCOUNTER — Other Ambulatory Visit: Payer: Self-pay | Admitting: *Deleted

## 2015-11-30 DIAGNOSIS — Z1212 Encounter for screening for malignant neoplasm of rectum: Secondary | ICD-10-CM

## 2015-11-30 LAB — POC HEMOCCULT BLD/STL (HOME/3-CARD/SCREEN)
FECAL OCCULT BLD: NEGATIVE
FECAL OCCULT BLD: NEGATIVE
Fecal Occult Blood, POC: NEGATIVE

## 2015-12-28 ENCOUNTER — Encounter: Payer: Self-pay | Admitting: Physician Assistant

## 2015-12-28 ENCOUNTER — Ambulatory Visit (INDEPENDENT_AMBULATORY_CARE_PROVIDER_SITE_OTHER): Payer: Medicare Other | Admitting: Physician Assistant

## 2015-12-28 VITALS — BP 110/70 | HR 61 | Temp 97.5°F | Resp 14 | Ht 63.0 in | Wt 139.0 lb

## 2015-12-28 DIAGNOSIS — Z79899 Other long term (current) drug therapy: Secondary | ICD-10-CM

## 2015-12-28 DIAGNOSIS — C3410 Malignant neoplasm of upper lobe, unspecified bronchus or lung: Secondary | ICD-10-CM

## 2015-12-28 DIAGNOSIS — E785 Hyperlipidemia, unspecified: Secondary | ICD-10-CM | POA: Diagnosis not present

## 2015-12-28 DIAGNOSIS — J439 Emphysema, unspecified: Secondary | ICD-10-CM

## 2015-12-28 DIAGNOSIS — E559 Vitamin D deficiency, unspecified: Secondary | ICD-10-CM | POA: Diagnosis not present

## 2015-12-28 DIAGNOSIS — R6889 Other general symptoms and signs: Secondary | ICD-10-CM

## 2015-12-28 DIAGNOSIS — M810 Age-related osteoporosis without current pathological fracture: Secondary | ICD-10-CM | POA: Diagnosis not present

## 2015-12-28 DIAGNOSIS — Z0001 Encounter for general adult medical examination with abnormal findings: Secondary | ICD-10-CM

## 2015-12-28 DIAGNOSIS — I700 Atherosclerosis of aorta: Secondary | ICD-10-CM | POA: Diagnosis not present

## 2015-12-28 DIAGNOSIS — R7303 Prediabetes: Secondary | ICD-10-CM

## 2015-12-28 DIAGNOSIS — I10 Essential (primary) hypertension: Secondary | ICD-10-CM | POA: Diagnosis not present

## 2015-12-28 DIAGNOSIS — R7309 Other abnormal glucose: Secondary | ICD-10-CM | POA: Diagnosis not present

## 2015-12-28 DIAGNOSIS — Z0000 Encounter for general adult medical examination without abnormal findings: Secondary | ICD-10-CM

## 2015-12-28 LAB — CBC WITH DIFFERENTIAL/PLATELET
BASOS PCT: 0 % (ref 0–1)
Basophils Absolute: 0 10*3/uL (ref 0.0–0.1)
Eosinophils Absolute: 0.1 10*3/uL (ref 0.0–0.7)
Eosinophils Relative: 3 % (ref 0–5)
HCT: 40.7 % (ref 36.0–46.0)
HEMOGLOBIN: 13.2 g/dL (ref 12.0–15.0)
LYMPHS ABS: 1.5 10*3/uL (ref 0.7–4.0)
LYMPHS PCT: 31 % (ref 12–46)
MCH: 28.3 pg (ref 26.0–34.0)
MCHC: 32.4 g/dL (ref 30.0–36.0)
MCV: 87.3 fL (ref 78.0–100.0)
MPV: 9.7 fL (ref 8.6–12.4)
Monocytes Absolute: 0.5 10*3/uL (ref 0.1–1.0)
Monocytes Relative: 10 % (ref 3–12)
NEUTROS ABS: 2.7 10*3/uL (ref 1.7–7.7)
Neutrophils Relative %: 56 % (ref 43–77)
Platelets: 171 10*3/uL (ref 150–400)
RBC: 4.66 MIL/uL (ref 3.87–5.11)
RDW: 14.5 % (ref 11.5–15.5)
WBC: 4.8 10*3/uL (ref 4.0–10.5)

## 2015-12-28 LAB — BASIC METABOLIC PANEL WITH GFR
BUN: 23 mg/dL (ref 7–25)
CALCIUM: 9 mg/dL (ref 8.6–10.4)
CO2: 32 mmol/L — AB (ref 20–31)
CREATININE: 0.68 mg/dL (ref 0.60–0.88)
Chloride: 101 mmol/L (ref 98–110)
GFR, EST NON AFRICAN AMERICAN: 83 mL/min (ref >=60)
GLUCOSE: 89 mg/dL (ref 65–99)
Potassium: 4 mmol/L (ref 3.5–5.3)
SODIUM: 141 mmol/L (ref 135–146)

## 2015-12-28 LAB — LIPID PANEL
CHOLESTEROL: 153 mg/dL (ref 125–200)
HDL: 57 mg/dL (ref >=46)
LDL Cholesterol: 76 mg/dL (ref <130)
TRIGLYCERIDES: 99 mg/dL (ref <150)
Total CHOL/HDL Ratio: 2.7 Ratio (ref <=5.0)
VLDL: 20 mg/dL (ref <30)

## 2015-12-28 LAB — HEPATIC FUNCTION PANEL
ALT: 16 U/L (ref 6–29)
AST: 23 U/L (ref 10–35)
Albumin: 4 g/dL (ref 3.6–5.1)
Alkaline Phosphatase: 62 U/L (ref 33–130)
BILIRUBIN INDIRECT: 0.4 mg/dL (ref 0.2–1.2)
Bilirubin, Direct: 0.1 mg/dL (ref <=0.2)
TOTAL PROTEIN: 6.3 g/dL (ref 6.1–8.1)
Total Bilirubin: 0.5 mg/dL (ref 0.2–1.2)

## 2015-12-28 LAB — MAGNESIUM: MAGNESIUM: 1.8 mg/dL (ref 1.5–2.5)

## 2015-12-28 LAB — TSH: TSH: 1.076 u[IU]/mL (ref 0.350–4.500)

## 2015-12-28 MED ORDER — ALENDRONATE SODIUM 70 MG PO TABS: 70.0000 mg | ORAL_TABLET | ORAL | Status: DC

## 2015-12-28 NOTE — Patient Instructions (Addendum)
Call Dr. Earlean Shawl Phone: [phone];  [employer] Free Hearing Test with no obligation # [phone] Do not have to be a member Tues-Sat 10-6  [place]- free test with no obligation # [phone] MUST BE A MEMBER Call for store hours   Your bone density shows that you have osteoporosis. Osteoporosis is the weakening of your bones and this puts you at much greater risk for fracture with a fall. I'm going to send a medication called Fosamax for you to take once weekly, here is some information below:  Breaking a bone can be serious, especially if the bone is in the hip. People who break a hip sometimes lose the ability to walk on their own. Many of them end up in a nursing home. That's why it is so important to avoid breaking a bone in the first place.   What can I do to keep my bones as healthy as possible? - You can: -Eat foods with a lot of calcium, such as milk, yogurt, and green leafy vegetables  -Eat foods with a lot of vitamin D, such as milk that has vitamin D added, and fish from the ocean  -Take calcium and vitamin D pills (if you do not get enough from the food that you eat) -Be active for at least 30 minutes, most days of the week -Avoid smoking  -Limit the amount of alcohol you drink to 1 to 2 drinks a day at most  What do osteoporosis medicines do? - If you have osteoporosis or a high risk of breaking a bone, the medicines your doctor prescribes can: -Reduce bone loss  -Increase bone density or keep it about the same -Reduce the chances that you will break a bone   Bisphosphonates - Most people being treated for osteoporosis take these medicines first. If they do not work well enough or cause side effects that are too hard to handle, there are other options.   Most people take one pill every week. If your doctor prescribes a bisphosphonate pill, you must take the medicine exactly as directed. If you don't, the medicine can irritate your throat or stomach. For  most bisphosphonate pills, you must:  -Take the pill first thing in the morning, before you have anything to eat or drink.  -Drink an 8-ounce glass of water with the pill, but not eat or drink anything else for 30 minutes or 1 hour (depending on which pill you take).  -Avoid lying down for 30 minutes after taking the pill. (You must sit or stand during that time).

## 2015-12-28 NOTE — Progress Notes (Signed)
MEDICARE ANNUAL WELLNESS VISIT AND FOLLOW UP  Assessment:   1. Essential hypertension - continue medications, DASH diet, exercise and monitor at home. Call if greater than 130/80.  - CBC with Differential/Platelet - BASIC METABOLIC PANEL WITH GFR - Hepatic function panel - TSH  2. Hyperlipidemia -continue medications, check lipids, decrease fatty foods, increase activity.  - Lipid panel  3. Prediabetes Discussed general issues about diabetes pathophysiology and management., Educational material distributed., Suggested low cholesterol diet., Encouraged aerobic exercise., Discussed foot care., Reminded to get yearly retinal exam. - Hemoglobin A1c - HM DIABETES FOOT EXAM  4. Vitamin D deficiency - Vit D  25 hydroxy (rtn osteoporosis monitoring)  5. Medication management - Magnesium  6. COPD monitor  7. Atherosclerosis of aorta Control blood pressure, cholesterol, glucose, increase exercise.   8. Hx of cancer of lung CXR 06/2015 normal, continue follow up  9. At low- moderate risk for fall Fall safety discussed  10. Osteoporosis Continue vitamin D, Ca Will start on fosamax  11. Environmental allergies Allergic rhinitis- Allegra OTC, increase H20, allergy hygiene explained.  Future Appointments Date Time Provider [place]  06/30/2016 10:00 AM Unk Pinto, MD [facility] None     Plan:   During the course of the visit the patient was educated and counseled about appropriate screening and preventive services including:    Pneumococcal vaccine   Influenza vaccine  Td vaccine  Screening electrocardiogram  Screening mammography  Bone densitometry screening  Colorectal cancer screening  Diabetes screening  Glaucoma screening  Nutrition counseling   Advanced directives: given info/requested  Conditions/risks identified: BMI: Discussed weight loss, diet, and increase physical activity.  Increase physical activity: AHA recommends 150  minutes of physical activity a week.  Medications reviewed Diabetes is at goal Urinary Incontinence is not an issue: discussed non pharmacology and pharmacology options.  Fall risk: low-moderate- had fall from ladder- discussed PT, home fall assessment, medications.    Subjective:   Penny [name] is a 82 y.o. female who presents for Medicare Annual Wellness Visit and 3 month follow up on hypertension, prediabetes, hyperlipidemia, vitamin D def.  Date of last medicare wellness visit 02/2015  Her blood pressure has been controlled at home, today their BP is BP: 110/70 mmHg She does not workout but is very active and does try to walk, works 3 days a week, Youth worker. She denies chest pain, shortness of breath, dizziness.  She is on cholesterol medication, pravastatin 40 and denies myalgias. Her cholesterol is at goal. The cholesterol last visit was:   Lab Results  Component Value Date   CHOL 187 06/15/2015   HDL 70 06/15/2015   LDLCALC 106* 06/15/2015   TRIG 56 06/15/2015   CHOLHDL 2.7 06/15/2015   She has been working on diet and exercise for prediabetes, and denies paresthesia of the feet, polydipsia and polyuria. Last A1C in the office was:  Lab Results  Component Value Date   HGBA1C 5.8* 06/15/2015  Patient is on Vitamin D supplement. Lab Results  Component Value Date   VD25OH 69 06/15/2015        Medication Review Current Outpatient Prescriptions on File Prior to Visit  Medication Sig Dispense Refill  . acyclovir (ZOVIRAX) 400 MG tablet Take 1 tablet (400 mg total) by mouth 3 (three) times daily. 90 tablet 0  . ALPRAZolam (XANAX) 0.5 MG tablet Take 1 tablet (0.5 mg total) by mouth 3 (three) times daily as needed for sleep or anxiety. 60 tablet 1  .  aspirin 81 MG chewable tablet Chew 81 mg by mouth daily.    . Cholecalciferol (VITAMIN D PO) Take 1-2 tablets by mouth See admin instructions. Vitamin d 5000 units per patient. Take twice every day except on  Monday, Wednesday, and Fridays take 1 tablet    . diphenhydramine-acetaminophen (TYLENOL PM) 25-500 MG TABS Take 1 tablet by mouth at bedtime. Take every night per patient    . hydrochlorothiazide (HYDRODIURIL) 25 MG tablet Take 1 tablet (25 mg total) by mouth daily. 90 tablet 3  . Lidocaine-Hydrocortisone Ace 2-2 % KIT Use 2-3 times daily as needed for rash and rectal discomfort. 1 each 3  . loperamide (IMODIUM) 2 MG capsule Take 2 mg by mouth 2 (two) times daily. [employer] brand anti-diarrhea. Take every day per patient    . magnesium gluconate (MAGONATE) 500 MG tablet Take 500 mg by mouth daily.     . meloxicam (MOBIC) 15 MG tablet Take once daily as needed daily with food for pain, do not take aleve/ibuprofen with it, can take tylenol 90 tablet 0  . metoprolol (LOPRESSOR) 50 MG tablet TAKE 1 TABLET BY MOUTH TWICE DAILY 180 tablet 1  . montelukast (SINGULAIR) 10 MG tablet Take 1 tablet daily for allergies 30 tablet 99  . simvastatin (ZOCOR) 40 MG tablet TAKE 1 TABLET BY MOUTH EVERY NIGHT AT BEDTIME FOR CHOLESTEROL 90 tablet 1   No current facility-administered medications on file prior to visit.    Current Problems (verified) Patient Active Problem List   Diagnosis Date Noted  . COPD (chronic obstructive pulmonary disease) with emphysema ([place]) 02/25/2015  . Atherosclerosis of aorta ([place]) 02/25/2015  . Multiple rib fractures 10/20/2014  . Prediabetes 04/15/2014  . Medication management 04/15/2014  . Hypertension   . Hyperlipidemia   . Vitamin D deficiency   . Lung cancer, upper lobe ([place]) 12/04/1996    Immunization History  Administered Date(s) Administered  . Influenza, High Dose Seasonal PF 09/15/2015  . Influenza-Unspecified 08/04/2012, 09/03/2013  . Pneumococcal Conjugate-13 12/04/2004, 07/21/2014  . Td 04/03/2013   Preventative care: Last colonoscopy: 2012 due 2017 Last mammogram: 12/2014, will schedule this week Last pap smear/pelvic exam: 2012 negative DEXA: 02/2015 Echo  2009 normal EF CXR 10/2014 CT cervical spin 10/2014 CT head 10/20/2014  Prior vaccinations: TD or Tdap: 2014  Influenza: 2016 Pneumococcal: 2006 Prevnar 13: 2015 Shingles/Zostavax: declines due to cost   Names of Other Physician/Practitioners you currently use: 1.  [facility]- here for primary care 2. Dr. Santa Lighter , eye doctor, last visit 02/2015 3. Dr. Gilford Rile, dentist, last visit 07/2015 Patient Care Team: Unk Pinto, MD as PCP - General (Internal Medicine) Druscilla Brownie, MD as Consulting Physician (Dermatology)- has appointment tomorrow Daryll Brod, MD as Consulting Physician (Orthopedic Surgery) Richmond Campbell, MD as Consulting Physician (Gastroenterology) Nicanor Alcon, MD as Attending Physician (Thoracic Surgery)   Past Surgical History  Procedure Laterality Date  . Carpal tunnel release Right     2009  . Shoulder adhesion release Right     2004  . Lung removal, partial Right     RUL 1998   Allergies  Allergen Reactions  . Ace Inhibitors Cough  . Calcium-Containing Compounds Other (See Comments)    constpation  . Lactose Intolerance (Gi) Diarrhea   Family History  Problem Relation Age of Onset  . Heart disease Mother   . Lung cancer Father     Tobacco Social History  Substance Use Topics  . Smoking status: Former Smoker  Quit date: 12/04/1996  . Smokeless tobacco: Never Used  . Alcohol Use: 0.6 oz/week    1 Glasses of wine per week   MEDICARE WELLNESS OBJECTIVES: Tobacco use: She does not smoke.  Patient is a former smoker. Alcohol Current alcohol use: glass of wine with dinner Caffeine Current caffeine use: coffee 2 /day Osteoporosis: postmenopausal estrogen deficiency and dietary calcium and/or vitamin D deficiency, History of fracture in the past year: No Diet: in general, a "healthy" diet   Physical activity: walking Depression/mood screen:  Yes - No Depression Hearing: impaired Visual acuity:  normal,  does perform annual eye exam  ADLs: self care Fall risk:  Low fall risk Home safety: good, discussed fall safety and not climbing on ladders.  Cognitive Testing  Alert? Yes  Normal Appearance?Yes  Oriented to person? Yes  Place? Yes   Time? Yes  Recall of three objects?  Yes  Can perform simple calculations? Yes  Displays appropriate judgment?Yes  Can read the correct time from a watch face?Yes EOL planning: Yes   Objective:   Blood pressure 110/70, pulse 61, temperature 97.5 F (36.4 C), temperature source Temporal, resp. rate 14, height _0  (1.6 m), weight 139 lb (63.05 kg), SpO2 94 %. Body mass index is 24.63 kg/(m^2).  General appearance: alert, no distress, WD/WN,  female HEENT: normocephalic, sclerae anicteric, TMs pearly, nares patent, no discharge or erythema, pharynx normal Oral cavity: MMM, no lesions Neck: supple, no lymphadenopathy, no thyromegaly, no masses Heart: RRR, normal S1, S2, no murmurs Lungs: CTA bilaterally, no wheezes, rhonchi, or rales Abdomen: +bs, soft, non tender, non distended, no masses, no hepatomegaly, no splenomegaly Musculoskeletal:  no swelling, no obvious deformity Extremities: no edema, no cyanosis, no clubbing Pulses: 2+ symmetric, upper and lower extremities, normal cap refill Neurological: alert, oriented x 3, CN2-12 intact, strength normal upper extremities and lower extremities, sensation normal throughout, DTRs 2+ throughout, no cerebellar signs, gait normal Psychiatric: normal affect, behavior normal, pleasant  Breast: defer Gyn: defer Rectal: defer  Medicare Attestation I have personally reviewed: The patient's medical and social history Their use of alcohol, tobacco or illicit drugs Their current medications and supplements The patient's functional ability including ADLs,fall risks, home safety risks, cognitive, and hearing and visual impairment Diet and physical activities Evidence for depression or mood  disorders  The patient's weight, height, BMI, and visual acuity have been recorded in the chart.  I have made referrals, counseling, and provided education to the patient based on review of the above and I have provided the patient with a written personalized care plan for preventive services.     Vicie Mutters, PA-C   12/28/2015

## 2015-12-29 LAB — HEMOGLOBIN A1C
Hgb A1c MFr Bld: 6 % — ABNORMAL HIGH (ref <5.7)
MEAN PLASMA GLUCOSE: 126 mg/dL — AB (ref <117)

## 2015-12-29 LAB — VITAMIN D 25 HYDROXY (VIT D DEFICIENCY, FRACTURES): VIT D 25 HYDROXY: 67 ng/mL (ref 30–100)

## 2016-01-25 ENCOUNTER — Other Ambulatory Visit: Payer: Self-pay

## 2016-01-25 DIAGNOSIS — Z1231 Encounter for screening mammogram for malignant neoplasm of breast: Secondary | ICD-10-CM

## 2016-02-08 ENCOUNTER — Other Ambulatory Visit: Payer: Self-pay | Admitting: Internal Medicine

## 2016-02-14 DIAGNOSIS — L821 Other seborrheic keratosis: Secondary | ICD-10-CM | POA: Diagnosis not present

## 2016-02-14 DIAGNOSIS — L919 Hypertrophic disorder of the skin, unspecified: Secondary | ICD-10-CM | POA: Diagnosis not present

## 2016-02-15 ENCOUNTER — Ambulatory Visit
Admission: RE | Admit: 2016-02-15 | Discharge: 2016-02-15 | Disposition: A | Discharge status: Home / Self Care | Payer: Medicare Other | Source: Ambulatory Visit

## 2016-02-15 DIAGNOSIS — Z1231 Encounter for screening mammogram for malignant neoplasm of breast: Secondary | ICD-10-CM | POA: Diagnosis not present

## 2016-02-16 DIAGNOSIS — Z0100 Encounter for examination of eyes and vision without abnormal findings: Secondary | ICD-10-CM | POA: Diagnosis not present

## 2016-02-16 DIAGNOSIS — H2513 Age-related nuclear cataract, bilateral: Secondary | ICD-10-CM | POA: Diagnosis not present

## 2016-03-21 DIAGNOSIS — H25812 Combined forms of age-related cataract, left eye: Secondary | ICD-10-CM | POA: Diagnosis not present

## 2016-03-21 DIAGNOSIS — H2512 Age-related nuclear cataract, left eye: Secondary | ICD-10-CM | POA: Diagnosis not present

## 2016-04-14 ENCOUNTER — Other Ambulatory Visit: Payer: Self-pay | Admitting: Physician Assistant

## 2016-05-07 ENCOUNTER — Other Ambulatory Visit: Payer: Self-pay | Admitting: Internal Medicine

## 2016-05-09 ENCOUNTER — Ambulatory Visit (INDEPENDENT_AMBULATORY_CARE_PROVIDER_SITE_OTHER): Payer: Medicare Other | Admitting: Physician Assistant

## 2016-05-09 VITALS — BP 118/66 | HR 72 | Temp 97.9°F | Resp 16 | Ht 62.5 in | Wt 135.8 lb

## 2016-05-09 DIAGNOSIS — M7022 Olecranon bursitis, left elbow: Secondary | ICD-10-CM | POA: Diagnosis not present

## 2016-05-09 MED ORDER — PREDNISONE 20 MG PO TABS: ORAL_TABLET | ORAL | Status: DC

## 2016-05-09 NOTE — Patient Instructions (Signed)
Elbow Bursitis Elbow bursitis is inflammation of the fluid-filled sac (bursa) between the tip of your elbow bone (olecranon) and your skin. Elbow bursitis may also be called olecranon bursitis. Normally, the olecranon bursa has only a [place] amount of fluid in it to cushion and protect your elbow bone. Elbow bursitis causes fluid to build up inside the bursa. Over time, this swelling and inflammation can cause pain when you bend or lean on your elbow.  CAUSES Elbow bursitis may be caused by:   Elbow injury (acute trauma).  Leaning on hard surfaces for long periods of time.  Infection from an injury that breaks the skin near your elbow.  A bone growth (spur) that forms at the tip of your elbow.  A medical condition that causes inflammation in your body, such as gout or rheumatoid arthritis.  The cause may also be unknown.  SIGNS AND SYMPTOMS  The first sign of elbow bursitis is usually swelling over the tip of your elbow. This can grow to be the size of a golf ball. This may start suddenly or develop gradually. You may also have:  Pain when bending or leaning on your elbow.  Restricted movement of your elbow.  If your bursitis is caused by an infection, symptoms may also include:  Redness, warmth, and tenderness of the elbow.  Drainage of pus from the swollen area over your elbow, if the skin breaks open. DIAGNOSIS  Your health care provider may be able to diagnose elbow bursitis based on your signs and symptoms, especially if you have recently been injured. Your health care provider will also do a physical exam. This may include:  X-rays to look for a bone spur or a bone fracture.  Draining fluid from the bursa to test it for infection.  Blood tests to rule out gout or rheumatoid arthritis. TREATMENT  Treatment for elbow bursitis depends on the cause. Treatment may include:  Medicines. These may include:  Over-the-counter medicines to relieve pain and  inflammation.  Antibiotic medicines to fight infection.  Injections of anti-inflammatory medicines (steroids).  Wrapping your elbow with a bandage.  Draining fluid from the bursa.  Wearing elbow pads.  If your bursitis does not get better with treatment, surgery may be needed to remove the bursa.  HOME CARE INSTRUCTIONS   Take medicines only as directed by your health care provider.  If you were prescribed an antibiotic medicine, finish all of it even if you start to feel better.  If your bursitis is caused by an injury, rest your elbow and wear your bandage as directed by your health care provider. You may alsoapply ice to the injured area as directed by your health care provider:  Put ice in a plastic bag.  Place a towel between your skin and the bag.  Leave the ice on for 20 minutes, 2-3 times per day.  Avoid any activities that cause elbow pain.  Use elbow pads or elbow wraps to cushion your elbow. SEEK MEDICAL CARE IF:  You have a fever.   Your symptoms do not get better with treatment.  Your pain or swelling gets worse.  Your elbow pain or swelling goes away and then returns.  You have drainage of pus from the swollen area over your elbow.   This information is not intended to replace advice given to you by your health care provider. Make sure you discuss any questions you have with your health care provider.   Document Released: 12/20/2006 Document Revised: 12/11/2014 Document  Reviewed: 07/29/2014 Elsevier Interactive Patient Education 2016 [place] A bursa is a fluid-filled sac that is located between soft tissues (ligaments, tendons, skin) and bones. The purpose of a bursa is to allow the soft tissue to function smoothly, without friction. The olecranon bursa is located between the back of the elbow (olecranon) and the skin. Olecranon bursitis involves inflammation of this bursa, resulting in pain. SYMPTOMS   Pain,  tenderness, swelling, warmth, or redness over the back of the elbow.  Reduced range of motion of the affected elbow.  Sometimes, severe pain with movement of the affected elbow.  Crackling sound (crepitation) when the bursa is moved or touched.  Often, painless swelling of the bursa.  Fever (when infected). CAUSES  Olecranon bursitis is often caused by direct hit (trauma) to the elbow. Less commonly, it is due to overuse and/or strenuous exercise that the elbow is not used to. RISK INCREASES WITH:  Sports that require bending or landing on the elbow (football, volleyball).  Vigorous or repetitive athletic training, or sudden increase or change in activity level (weekend warriors).  Failure to warm up properly before activity.  Poor exercise technique.  Playing on artificial turf. PREVENTION  Avoid injuries and the overuse of muscles whenever possible.  Warm up and stretch properly before activity.  Allow for adequate recovery between workouts.  Maintain physical fitness:  Strength, flexibility, and endurance.  Cardiovascular fitness.  Learn and use proper technique.  Wear properly fitted and padded protective equipment. PROGNOSIS  If treated properly, olecranon bursitis is usually curable within 2 weeks.  RELATED COMPLICATIONS   Longer healing time, if not properly treated or if not given enough time to heal.  Recurring symptoms that result in a chronic problem.  Joint stiffness with permanent limitation of the affected joint's movement.  Infection of the bursa.  Chronic inflammation or scarring of the bursa. TREATMENT Treatment first involves the use of ice and medicine to reduce pain and inflammation. The use of strengthening and stretching exercises may help reduce pain with activity. These exercises may be performed at home or with a therapist. Elbow pads may be advised to protect the bursa. If symptoms persist despite nonsurgical treatment, a procedure to  withdraw fluid from the bursa may be advised. This procedure may be accompanied with an injection of corticosteroids to reduce inflammation. Sometimes, surgery is needed to remove the bursa. MEDICATION  If pain medicine is needed, nonsteroidal anti-inflammatory medicines (aspirin and ibuprofen), or other minor pain relievers (acetaminophen), are often advised.  Do not take pain medicine for 7 days before surgery.  Prescription pain relievers may be given, if your caregiver thinks they are needed. Use only as directed and only as much as you need.  Corticosteroid injections may be given by your caregiver. These injections should be reserved for the most serious cases, because they may only be given a certain number of times. HEAT AND COLD  Cold treatment (icing) should be applied for 10 to 15 minutes every 2 to 3 hours for inflammation and pain, and immediately after activity that aggravates your symptoms. Use ice packs or an ice massage.  Heat treatment may be used before performing stretching and strengthening activities prescribed by your caregiver, physical therapist, or athletic trainer. Use a heat pack or a warm water soak. SEEK IMMEDIATE MEDICAL CARE IF:   Symptoms get worse or do not improve in 2 weeks, despite treatment.  Signs of infection develop, including fever of 102 F (38.9  C), increased pain, redness, warmth, or pus draining from the bursa.  New, unexplained symptoms develop. (Drugs used in treatment may produce side effects.) EXERCISES  RANGE OF MOTION (ROM) AND STRETCHING EXERCISES - Olecranon Bursitis These exercises may help you when beginning to rehabilitate your injury. Your symptoms may resolve with or without further involvement from your physician, physical therapist or athletic trainer. While completing these exercises, remember:   Restoring tissue flexibility helps normal motion to return to the joints. This allows healthier, less painful movement and  activity.  An effective stretch should be held for at least 30 seconds.  A stretch should never be painful. You should only feel a gentle lengthening or release in the stretched tissue. RANGE OF MOTION - Elbow Flexion, Supine  Lie on your back. Extend your right / left arm into the air, bracing it with your opposite hand. Allow your right / left arm to relax.  Let your elbow bend, allowing your hand to fall slowly toward your chest.  You should feel a gentle stretch along the back of your upper arm and elbow. Your physician, physical therapist or athletic trainer may ask you to hold a __________ hand weight to increase the intensity of this stretch.  Hold for __________ seconds. Slowly return your right / left arm to the upright position. Repeat __________ times. Complete this exercise __________ times per day. STRETCH - Elbow Flexors  Lie on a firm bed or countertop on your back. Be sure that you are in a comfortable position which will allow you to relax your arm muscles.  Place a folded towel under your right / left upper arm, so that your elbow and shoulder are at the same height. Extend your arm; your elbow should not rest on the bed or towel  Allow the weight of your hand to straighten your elbow. Keep your arm and chest muscles relaxed. Your caregiver may ask you to increase the intensity of your stretch by adding a Comp wrist or hand weight.  Hold for __________ seconds. You should feel a stretch on the inside of your elbow. Slowly return to the starting position. Repeat __________ times. Complete this exercise __________ times per day. STRENGTHENING EXERCISES - Olecranon Bursitis These exercises will help you regain your strength. They may resolve your symptoms with or without further involvement from your physician, physical therapist or athletic trainer. While completing these exercises, remember:   Muscles can gain both the endurance and the strength needed for everyday  activities through controlled exercises.  Complete these exercises as instructed by your physician, physical therapist or athletic trainer. Increase the resistance and repetitions only as guided by your caregiver.  You may experience muscle soreness or fatigue, but the pain or discomfort you are trying to eliminate should never worsen during these exercises. If this pain does worsen, stop and make certain you are following the directions exactly. If the pain is still present after adjustments, discontinue the exercise until you can discuss the trouble with your caregiver. STRENGTH - Elbow Extensors, Isometric  Stand or sit upright on a firm surface. Place your right / left arm so that your palm faces your stomach, and it is at the height of your waist.  Place your opposite hand on the underside of your forearm. Gently push up as your right / left arm resists. Push as hard as you can with both arms without causing any pain or movement at your right / left elbow. Hold this stationary position for __________ seconds.  Gradually release the tension in both arms. Allow your muscles to relax completely before repeating. Repeat __________ times. Complete this exercise __________ times per day. STRENGTH - Elbow Flexors, Isometric  Stand or sit upright on a firm surface. Place your right / left arm so that your hand is palm-up and at the height of your waist.  Place your opposite hand on top of your forearm. Gently push down as your right / left arm resists. Push as hard as you can with both arms without causing any pain or movement at your right / left elbow. Hold this stationary position for __________ seconds.  Gradually release the tension in both arms. Allow your muscles to relax completely before repeating. Repeat __________ times. Complete this exercise __________ times per day. STRENGTH - Elbow Flexors, Supinated  With good posture, stand or sit on a firm chair without armrests. Allow your  right / left arm to rest at your side with your palm facing forward.  Holding a __________ weight, or gripping a rubber exercise band or tubing,  bring your hand toward your shoulder.  Allow your muscles to control the resistance as your hand returns to your side. Repeat __________ times. Complete this exercise __________ times per day.  STRENGTH - Elbow Flexors, Neutral  With good posture, stand or sit on a firm chair without armrests. Allow your right / left arm to rest at your side with your thumb facing forward.  Holding a __________weight, or gripping a rubber exercise band or tubing,  bring your hand toward your shoulder.  Allow your muscles to control the resistance as your hand returns to your side. Repeat __________ times. Complete this exercise __________ times per day.  STRENGTH - Elbow Extensors  Lie on your back. Extend your right / left elbow into the air, pointing it toward the ceiling. Brace your arm with your opposite hand.*  Holding a __________ weight in your hand, slowly straighten your right / left elbow.  Allow your muscles to control the weight as your hand returns to its starting position. Repeat __________ times. Complete this exercise __________ times per day. *You may also stand with your elbow overhead and pointed toward the ceiling, supported by your opposite hand. STRENGTH - Elbow Extensors, Dynamic  With good posture, stand, or sit on a firm chair without armrests. Keeping your upper arms at your side, bring both hands up to your right / left shoulder while gripping a rubber exercise band or tubing. Your right / left hand should be just below the other hand.  Straighten your right / left elbow. Hold for __________ seconds.  Allow your muscles to control the rubber exercise band, as your hand returns to your shoulder. Repeat __________ times. Complete this exercise __________ times per day.   This information is not intended to replace advice given to  you by your health care provider. Make sure you discuss any questions you have with your health care provider.   Document Released: 11/20/2005 Document Revised: 04/06/2015 Document Reviewed: 03/04/2009 Elsevier Interactive Patient Education [employer].

## 2016-05-09 NOTE — Progress Notes (Addendum)
Subjective:    Patient ID: Penny [name], female    DOB: [birth year], 82 y.o.   MRN: [id number]  HPI 82 y.o. W right handed F with history of HTN, chol, RUL cancer, osteoporosis, COPD presents with left elbow swelling. No injury, no pain, worked in the yard all day Saturday/sunday, noticed left elbow was swollen late Sunday night. No fever, chills, warmth, tenderness. Has not done anything to it.   Blood pressure 118/66, pulse 72, temperature 97.9 F (36.6 C), resp. rate 16, height 5' 2.5" (1.588 m), weight 135 lb 12.8 oz (61.598 kg).   Past Medical History  Diagnosis Date  . Hypertension   . Hyperlipidemia   . Lung cancer, upper lobe ([place]) 12/04/1996    RUL  . Osteoporosis   . Thigh shingles 12/04/1996  . S/P partial lobectomy of lung     [id number]  . Vitamin D deficiency   . Thoracic aorta atherosclerosis ([place])   . COPD (chronic obstructive pulmonary disease) with emphysema ([place])    Current Outpatient Prescriptions on File Prior to Visit  Medication Sig Dispense Refill  . acyclovir (ZOVIRAX) 400 MG tablet Take 1 tablet (400 mg total) by mouth 3 (three) times daily. 90 tablet 0  . alendronate (FOSAMAX) 70 MG tablet TAKE 1 TABLET(70 MG) BY MOUTH 1 TIME A WEEK 12 tablet 1  . aspirin 81 MG chewable tablet Chew 81 mg by mouth daily.    . Cholecalciferol (VITAMIN D PO) Take 1-2 tablets by mouth See admin instructions. Vitamin d 5000 units per patient. Take twice every day except on Monday, Wednesday, and Fridays take 1 tablet    . diphenhydramine-acetaminophen (TYLENOL PM) 25-500 MG TABS Take 1 tablet by mouth at bedtime. Take every night per patient    . hydrochlorothiazide (HYDRODIURIL) 25 MG tablet Take 1 tablet (25 mg total) by mouth daily. 90 tablet 3  . Lidocaine-Hydrocortisone Ace 2-2 % KIT Use 2-3 times daily as needed for rash and rectal discomfort. 1 each 3  . loperamide (IMODIUM) 2 MG capsule Take 2 mg by mouth 2 (two) times daily. [employer] brand anti-diarrhea. Take every day per  patient    . magnesium gluconate (MAGONATE) 500 MG tablet Take 500 mg by mouth daily.     . meloxicam (MOBIC) 15 MG tablet Take once daily as needed daily with food for pain, do not take aleve/ibuprofen with it, can take tylenol 90 tablet 0  . metoprolol (LOPRESSOR) 50 MG tablet TAKE 1 TABLET BY MOUTH TWICE DAILY 180 tablet 0  . montelukast (SINGULAIR) 10 MG tablet Take 1 tablet daily for allergies 30 tablet 99  . simvastatin (ZOCOR) 40 MG tablet TAKE 1 TABLET BY MOUTH EVERY NIGHT AT BEDTIME FOR CHOLESTEROL 90 tablet 1   No current facility-administered medications on file prior to visit.    Review of Systems  Constitutional: Negative for fever and chills.  HENT: Positive for congestion. Negative for ear pain and sore throat.   Eyes: Negative.   Respiratory: Negative for cough, shortness of breath and wheezing.   Cardiovascular: Negative for chest pain, palpitations and leg swelling.  Gastrointestinal: Negative for diarrhea, constipation and blood in stool.  Genitourinary: Negative.   Musculoskeletal: Positive for joint swelling (left elbow). Negative for myalgias and arthralgias.  Skin: Positive for rash.  Neurological: Negative for dizziness and headaches.  Psychiatric/Behavioral: The patient is not nervous/anxious.        Objective:   Physical Exam  Constitutional: She is oriented to person, place, and time.  She appears well-developed.  Cardiovascular: Normal rate and regular rhythm.   Pulmonary/Chest: Effort normal and breath sounds normal.  Musculoskeletal:  Left olecranon bursa swollen without warmth or tenderness, easily mobile, normal distal neurovascular exam.   Neurological: She is alert and oriented to person, place, and time. She has normal reflexes. No cranial nerve deficit.  Skin: Skin is warm and dry. No rash noted.       Assessment & Plan:  left elbow olecranon bursitis- RICE, prednisone, do not put pressure on elbow, if any redness, warmth call office, will  refer to ortho in 2 weeks if not better   Future Appointments Date Time Provider [place]  06/30/2016 10:00 AM Unk Pinto, MD [facility] None   Addendum: Patient called 05/25/2016 stating that elbow is not better, will refer to ortho, continue RICE.

## 2016-05-25 NOTE — Addendum Note (Signed)
Addended by: Vicie Mutters R on: 05/25/2016 12:53 PM   Modules accepted: Orders

## 2016-05-31 DIAGNOSIS — M25422 Effusion, left elbow: Secondary | ICD-10-CM | POA: Diagnosis not present

## 2016-06-04 ENCOUNTER — Other Ambulatory Visit: Payer: Self-pay | Admitting: Internal Medicine

## 2016-06-09 ENCOUNTER — Ambulatory Visit (INDEPENDENT_AMBULATORY_CARE_PROVIDER_SITE_OTHER): Payer: Medicare Other | Admitting: Internal Medicine

## 2016-06-09 ENCOUNTER — Encounter: Payer: Self-pay | Admitting: Internal Medicine

## 2016-06-09 VITALS — BP 140/72 | HR 76 | Temp 97.2°F | Resp 16 | Ht 62.0 in | Wt 136.6 lb

## 2016-06-09 DIAGNOSIS — R300 Dysuria: Secondary | ICD-10-CM

## 2016-06-09 MED ORDER — CIPROFLOXACIN HCL 250 MG PO TABS: ORAL_TABLET | ORAL | Status: DC

## 2016-06-09 NOTE — Progress Notes (Signed)
[facility]   Unk Pinto, M.D.    Uvaldo Bristle. Silverio Lay, P.A.-C      Starlyn Skeans, P.A.-C   [facility]                [address] [place], [place] [address] Telephone [phone] Telefax [phone] _______________________________ Subjective:    Patient ID: Penny [name], female    DOB: 1933/09/08, 82 y.o.   MRN: [id number]  HPI  This very nice 82 yo WWF presented w/ complaints of dyuria and urinary frequency. Denied fever, chills, rash , sweats or vaginal discharge.   Medication Sig  . acyclovir  400 MG tablet Take 1 tablet (400 mg total) by mouth 3 (three) times daily.  Marland Kitchen alendronate  70 MG tablet TAKE 1 TABLET(70 MG) BY MOUTH 1 TIME A WEEK  . aspirin 81 MG chewable tablet Chew 81 mg by mouth daily.  Marland Kitchen VITAMIN D 5000 units Take twice every day except on Monday, Wednesday, and Fridays take 1 tab = 11 tabs /week  . TYLENOL PM 25-500 MG  Take 1 tablet by mouth at bedtime. Take every night per patient  . hydrochlorothiazide25 MG  TAKE 1 TABLET(25 MG) BY MOUTH DAILY  . Lidocaine-Hydrocortisone Ace 2-2 %  Use 2-3 times daily as needed for rash and rectal discomfort.  . loperamide 2 MG capsule Take 2 mg by mouth 2 (two) times daily. [employer] brand anti-diarrhea. Take every day per patient  . magnesium ) 500 MG tablet Take 500 mg by mouth daily.   . meloxicam (MOBIC) 15 MG tablet Take once daily as needed daily with food for pain, do not take aleve/ibuprofen with it, can take tylenol  . metoprolol  50 MG tablet TAKE 1 TABLET BY MOUTH TWICE DAILY  . montelukast  10 MG tablet Take 1 tablet daily for allergies  . Simvastatin 40 MG tablet TAKE 1 TABLET BY MOUTH EVERY NIGHT AT BEDTIME FOR CHOLESTEROL   Allergies  Allergen Reactions  . Ace Inhibitors Cough  . Calcium-Containing Compounds Other (See Comments)    constpation  . Lactose Intolerance (Gi) Diarrhea   Past Medical History  Diagnosis Date  .  Hypertension   . Hyperlipidemia   . Lung cancer, upper lobe ([place]) 12/04/1996    RUL  . Osteoporosis   . Thigh shingles 12/04/1996  . S/P partial lobectomy of lung     [id number]  . Vitamin D deficiency   . Thoracic aorta atherosclerosis ([place])   . COPD (chronic obstructive pulmonary disease) with emphysema [facility])    Past Surgical History  Procedure Laterality Date  . Carpal tunnel release Right     2009  . Shoulder adhesion release Right     2004  . Lung removal, partial Right     RUL 1998   Review of Systems  10 point systems review negative except as above.    Objective:   Physical Exam  BP 140/72 mmHg  Pulse 76  Temp(Src) 97.2 F (36.2 C)  Resp 16  Ht '5\' 2"'$  (1.575 m)  Wt 136 lb 9.6 oz (61.961 kg)  BMI 24.98 kg/m2  Skin - clear w/o rash, lesions, cyanosis or icterus  HEENT - Eac's patent. TM's Nl. EOM's full. PERRLA. NasoOroPharynx clear. Neck - supple. Nl Thyroid. Chest - Clear. Nl CVAT.  Cor - Nl HS. RRR w/o sig MGR. PP 1(+). No edema.  Abd - No palpable organomegaly, masses or tenderness. BS nl. MS- FROM w/o deformities. Muscle power, tone and bulk Nl. Gait Nl. Neuro - Nl w/o focal abnormalities. Psyche - Mental status normal & appropriate.  .    Assessment & Plan:   1. Dysuria  - Urinalysis, Routine w reflex microscopic (not at [facility]) - Urine culture - ciprofloxacin (CIPRO) 250 MG tablet; Take 1 tablet 2 x / day with food for Infection  Dispense: 20 tablet; Refill: 0

## 2016-06-09 NOTE — Patient Instructions (Addendum)
Urinary Tract Infection  Urinary tract infections (UTIs) can develop anywhere along your urinary tract. Your urinary tract is your body's drainage system for removing wastes and extra water. Your urinary tract includes two kidneys, two ureters, a bladder, and a urethra. Your kidneys are a pair of bean-shaped organs. Each kidney is about the size of your fist. They are located below your ribs, one on each side of your spine. CAUSES Infections are caused by microbes, which are microscopic organisms, including fungi, viruses, and bacteria. These organisms are so [place] that they can only be seen through a microscope. Bacteria are the microbes that most commonly cause UTIs. SYMPTOMS  Symptoms of UTIs may vary by age and gender of the patient and by the location of the infection. Symptoms in young women typically include a frequent and intense urge to urinate and a painful, burning feeling in the bladder or urethra during urination. Older women and men are more likely to be tired, shaky, and weak and have muscle aches and abdominal pain. A fever may mean the infection is in your kidneys. Other symptoms of a kidney infection include pain in your back or sides below the ribs, nausea, and vomiting. DIAGNOSIS To diagnose a UTI, your caregiver will ask you about your symptoms. Your caregiver will also ask you to provide a urine sample. The urine sample will be tested for bacteria and white blood cells. White blood cells are made by your body to help fight infection. TREATMENT  Typically, UTIs can be treated with medication. Because most UTIs are caused by a bacterial infection, they usually can be treated with the use of antibiotics. The choice of antibiotic and length of treatment depend on your symptoms and the type of bacteria causing your infection. HOME CARE INSTRUCTIONS  If you were prescribed antibiotics, take them exactly as your caregiver instructs you. Finish the medication even if you feel better after  you have only taken some of the medication.  Drink enough water and fluids to keep your urine clear or pale yellow.  Avoid caffeine, tea, and carbonated beverages. They tend to irritate your bladder.  Empty your bladder often. Avoid holding urine for long periods of time.  Empty your bladder before and after sexual intercourse.  After a bowel movement, women should cleanse from front to back. Use each tissue only once. SEEK MEDICAL CARE IF:   You have back pain.  You develop a fever.  Your symptoms do not begin to resolve within 3 days. SEEK IMMEDIATE MEDICAL CARE IF:   You have severe back pain or lower abdominal pain.  You develop chills.  You have nausea or vomiting.  You have continued burning or discomfort with urination. MAKE SURE YOU:   Understand these instructions.  Will watch your condition.  Will get help right away if you are not doing well or get worse.

## 2016-06-10 LAB — URINALYSIS, ROUTINE W REFLEX MICROSCOPIC
Bilirubin Urine: NEGATIVE
GLUCOSE, UA: NEGATIVE
Ketones, ur: NEGATIVE
NITRITE: POSITIVE — AB
PH: 6.5 (ref 5.0–8.0)
SPECIFIC GRAVITY, URINE: 1.014 (ref 1.001–1.035)

## 2016-06-10 LAB — URINALYSIS, MICROSCOPIC ONLY
CASTS: NONE SEEN [LPF]
CRYSTALS: NONE SEEN [HPF]
SQUAMOUS EPITHELIAL / LPF: NONE SEEN [HPF] (ref <=5)
WBC, UA: >60 WBC/HPF — AB (ref <=5)
YEAST: NONE SEEN [HPF]

## 2016-06-12 LAB — URINE CULTURE: Colony Count: >=100000

## 2016-06-20 DIAGNOSIS — H25811 Combined forms of age-related cataract, right eye: Secondary | ICD-10-CM | POA: Diagnosis not present

## 2016-06-20 DIAGNOSIS — H2511 Age-related nuclear cataract, right eye: Secondary | ICD-10-CM | POA: Diagnosis not present

## 2016-06-28 DIAGNOSIS — M25422 Effusion, left elbow: Secondary | ICD-10-CM | POA: Diagnosis not present

## 2016-06-30 ENCOUNTER — Ambulatory Visit (INDEPENDENT_AMBULATORY_CARE_PROVIDER_SITE_OTHER): Payer: Medicare Other | Admitting: Internal Medicine

## 2016-06-30 VITALS — BP 132/80 | HR 64 | Temp 97.7°F | Resp 16 | Ht 62.0 in | Wt 135.4 lb

## 2016-06-30 DIAGNOSIS — R7303 Prediabetes: Secondary | ICD-10-CM

## 2016-06-30 DIAGNOSIS — M810 Age-related osteoporosis without current pathological fracture: Secondary | ICD-10-CM

## 2016-06-30 DIAGNOSIS — I700 Atherosclerosis of aorta: Secondary | ICD-10-CM

## 2016-06-30 DIAGNOSIS — Z1212 Encounter for screening for malignant neoplasm of rectum: Secondary | ICD-10-CM

## 2016-06-30 DIAGNOSIS — E559 Vitamin D deficiency, unspecified: Secondary | ICD-10-CM

## 2016-06-30 DIAGNOSIS — E785 Hyperlipidemia, unspecified: Secondary | ICD-10-CM

## 2016-06-30 DIAGNOSIS — I10 Essential (primary) hypertension: Secondary | ICD-10-CM | POA: Diagnosis not present

## 2016-06-30 DIAGNOSIS — Z136 Encounter for screening for cardiovascular disorders: Secondary | ICD-10-CM | POA: Diagnosis not present

## 2016-06-30 DIAGNOSIS — Z0000 Encounter for general adult medical examination without abnormal findings: Secondary | ICD-10-CM | POA: Diagnosis not present

## 2016-06-30 DIAGNOSIS — Z79899 Other long term (current) drug therapy: Secondary | ICD-10-CM

## 2016-06-30 DIAGNOSIS — C3410 Malignant neoplasm of upper lobe, unspecified bronchus or lung: Secondary | ICD-10-CM

## 2016-06-30 DIAGNOSIS — R6889 Other general symptoms and signs: Secondary | ICD-10-CM | POA: Diagnosis not present

## 2016-06-30 DIAGNOSIS — Z0001 Encounter for general adult medical examination with abnormal findings: Secondary | ICD-10-CM

## 2016-06-30 LAB — BASIC METABOLIC PANEL WITH GFR
BUN: 18 mg/dL (ref 7–25)
CALCIUM: 9.2 mg/dL (ref 8.6–10.4)
CO2: 28 mmol/L (ref 20–31)
CREATININE: 0.72 mg/dL (ref 0.60–0.88)
Chloride: 102 mmol/L (ref 98–110)
GFR, Est African American: >89 mL/min (ref >=60)
GFR, Est Non African American: 79 mL/min (ref >=60)
GLUCOSE: 87 mg/dL (ref 65–99)
Potassium: 3.8 mmol/L (ref 3.5–5.3)
SODIUM: 141 mmol/L (ref 135–146)

## 2016-06-30 LAB — LIPID PANEL
CHOLESTEROL: 161 mg/dL (ref 125–200)
HDL: 80 mg/dL (ref >=46)
LDL Cholesterol: 68 mg/dL (ref <130)
Total CHOL/HDL Ratio: 2 Ratio (ref <=5.0)
Triglycerides: 66 mg/dL (ref <150)
VLDL: 13 mg/dL (ref <30)

## 2016-06-30 LAB — CBC WITH DIFFERENTIAL/PLATELET
BASOS ABS: 0 {cells}/uL (ref 0–200)
Basophils Relative: 0 %
EOS ABS: 148 {cells}/uL (ref 15–500)
Eosinophils Relative: 4 %
HCT: 41.3 % (ref 35.0–45.0)
HEMOGLOBIN: 13.1 g/dL (ref 11.7–15.5)
Lymphocytes Relative: 28 %
Lymphs Abs: 1036 cells/uL (ref 850–3900)
MCH: 27.4 pg (ref 27.0–33.0)
MCHC: 31.7 g/dL — AB (ref 32.0–36.0)
MCV: 86.4 fL (ref 80.0–100.0)
MONOS PCT: 13 %
MPV: 10.2 fL (ref 7.5–12.5)
Monocytes Absolute: 481 cells/uL (ref 200–950)
NEUTROS PCT: 55 %
Neutro Abs: 2035 cells/uL (ref 1500–7800)
PLATELETS: 189 10*3/uL (ref 140–400)
RBC: 4.78 MIL/uL (ref 3.80–5.10)
RDW: 14.4 % (ref 11.0–15.0)
WBC: 3.7 10*3/uL — ABNORMAL LOW (ref 3.8–10.8)

## 2016-06-30 LAB — HEPATIC FUNCTION PANEL
ALBUMIN: 4.1 g/dL (ref 3.6–5.1)
ALT: 14 U/L (ref 6–29)
AST: 23 U/L (ref 10–35)
Alkaline Phosphatase: 58 U/L (ref 33–130)
BILIRUBIN DIRECT: 0.1 mg/dL (ref <=0.2)
Indirect Bilirubin: 0.4 mg/dL (ref 0.2–1.2)
TOTAL PROTEIN: 6.5 g/dL (ref 6.1–8.1)
Total Bilirubin: 0.5 mg/dL (ref 0.2–1.2)

## 2016-06-30 LAB — MAGNESIUM: MAGNESIUM: 1.7 mg/dL (ref 1.5–2.5)

## 2016-06-30 LAB — HEMOGLOBIN A1C
HEMOGLOBIN A1C: 5.6 % (ref <5.7)
MEAN PLASMA GLUCOSE: 114 mg/dL

## 2016-06-30 LAB — TSH: TSH: 0.74 mIU/L

## 2016-06-30 NOTE — Progress Notes (Signed)
[facility]                   Unk Pinto, M.D.    Uvaldo Bristle. Silverio Lay, P.A.-C      Starlyn Skeans, P.A.-C   [facility]                [address] [place], [place] [address] Telephone [phone] Telefax [phone]  Annual Screening/Preventative Visit And Comprehensive Evaluation &  Examination     This very nice 82 y.o.female WWF presents for a Wellness/Preventative Visit & comprehensive evaluation and management of multiple medical co-morbidities.  Patient has been followed for HTN, Prediabetes, Hyperlipidemia and Vitamin D Deficiency. Patient has significant hx/o RULobectomy for Ca in 1998 and remains in remission.       HTN predates circa 2000. Patient's BP has been controlled at home and patient denies any cardiac symptoms as chest pain, palpitations, shortness of breath, dizziness or ankle swelling. Today's BP: 132/80      Patient's hyperlipidemia is controlled with diet and medications. Patient denies myalgias or other medication SE's. Last lipids were  Lab Results  Component Value Date   CHOL 161 06/30/2016   HDL 80 06/30/2016   LDLCALC 68 06/30/2016   TRIG 66 06/30/2016   CHOLHDL 2.0 06/30/2016      Patient has prediabetes with A1c 5.9% predating since 2009 and patient denies reactive hypoglycemic symptoms, visual blurring, diabetic polys, or paresthesias. Last A1c was normal: Lab Results  Component Value Date   HGBA1C 5.6 06/30/2016      Patient is on Fosamax for Osteoporosis. Finally, patient has history of Vitamin D Deficiency  Of "12" in 2008 and last Vitamin D was  Lab Results  Component Value Date   VD25OH 67 06/30/2016   Current Outpatient Prescriptions on File Prior to Visit  Medication Sig  . acyclovir (ZOVIRAX) 400 MG tablet Take 1 tablet (400 mg total) by mouth 3 (three) times daily.  Marland Kitchen alendronate (FOSAMAX) 70 MG tablet TAKE 1 TABLET(70 MG) BY MOUTH 1 TIME A WEEK   . aspirin 81 MG chewable tablet Chew 81 mg by mouth daily.  . Cholecalciferol (VITAMIN D PO) Take 1-2 tablets by mouth See admin instructions. Vitamin d 5000 units per patient. Take twice every day except on Monday, Wednesday, and Fridays take 1 tablet  . diphenhydramine-acetaminophen (TYLENOL PM) 25-500 MG TABS Take 1 tablet by mouth at bedtime. Take every night per patient  . hydrochlorothiazide (HYDRODIURIL) 25 MG tablet TAKE 1 TABLET(25 MG) BY MOUTH DAILY  . loperamide (IMODIUM) 2 MG capsule Take 2 mg by mouth 2 (two) times daily. [employer] brand anti-diarrhea. Take every day per patient  . magnesium gluconate (MAGONATE) 500 MG tablet Take 500 mg by mouth daily.   . meloxicam (MOBIC) 15 MG tablet Take once daily as needed daily with food for pain, do not take aleve/ibuprofen with it, can take tylenol  . metoprolol (LOPRESSOR) 50 MG tablet TAKE 1 TABLET BY MOUTH TWICE DAILY  . simvastatin (ZOCOR) 40 MG tablet TAKE 1 TABLET BY MOUTH EVERY NIGHT AT BEDTIME FOR CHOLESTEROL   No current facility-administered medications on file prior to visit.    Allergies  Allergen Reactions  . Ace Inhibitors Cough  . Calcium-Containing Compounds Other (See Comments)    constpation  . Lactose Intolerance (Gi) Diarrhea   Past Medical History:  Diagnosis Date  .  COPD (chronic obstructive pulmonary disease) with emphysema ([place])   . Hyperlipidemia   . Hypertension   . Lung cancer, upper lobe ([place]) 12/04/1996   RUL  . Osteoporosis   . S/P partial lobectomy of lung    [id number]  . Thigh shingles 12/04/1996  . Thoracic aorta atherosclerosis ([place])   . Vitamin D deficiency    Health Maintenance  Topic Date Due  . ZOSTAVAX  09/04/2016 (Originally 06/11/1995)  . INFLUENZA VACCINE  07/04/2016  . TETANUS/TDAP  04/04/2023  . DEXA SCAN  Completed  . PNA vac Low Risk Adult  Completed   Immunization History  Administered Date(s) Administered  . Influenza, High Dose Seasonal PF 09/15/2015  .  Influenza-Unspecified 08/04/2012, 09/03/2013  . Pneumococcal Conjugate-13 07/21/2014  . Pneumococcal Polysaccharide-23 10/30/2005  . Td 04/03/2013   Past Surgical History:  Procedure Laterality Date  . CARPAL TUNNEL RELEASE Right    2009  . LUNG REMOVAL, PARTIAL Right    RUL 1998  . SHOULDER ADHESION RELEASE Right    2004   Family History  Problem Relation Age of Onset  . Heart disease Mother   . Lung cancer Father    Social History  Substance Use Topics  . Smoking status: Former Smoker    Quit date: 12/04/1996  . Smokeless tobacco: Never Used  . Alcohol use 0.6 oz/week    1 Glasses of wine per week    ROS Constitutional: Denies fever, chills, weight loss/gain, headaches, insomnia,  night sweats, and change in appetite. Does c/o fatigue. Eyes: Denies redness, blurred vision, diplopia, discharge, itchy, watery eyes.  ENT: Denies discharge, congestion, post nasal drip, epistaxis, sore throat, earache, hearing loss, dental pain, Tinnitus, Vertigo, Sinus pain, snoring.  Cardio: Denies chest pain, palpitations, irregular heartbeat, syncope, dyspnea, diaphoresis, orthopnea, PND, claudication, edema Respiratory: denies cough, dyspnea, DOE, pleurisy, hoarseness, laryngitis, wheezing.  Gastrointestinal: Denies dysphagia, heartburn, reflux, water brash, pain, cramps, nausea, vomiting, bloating, diarrhea, constipation, hematemesis, melena, hematochezia, jaundice, hemorrhoids Genitourinary: Denies dysuria, frequency, urgency, nocturia, hesitancy, discharge, hematuria, flank pain Breast: Breast lumps, nipple discharge, bleeding.  Musculoskeletal: Denies arthralgia, myalgia, stiffness, Jt. Swelling, pain, limp, and strain/sprain. Denies falls. Skin: Denies puritis, rash, hives, warts, acne, eczema, changing in skin lesion Neuro: No weakness, tremor, incoordination, spasms, paresthesia, pain Psychiatric: Denies confusion, memory loss, sensory loss. Denies Depression. Endocrine: Denies change  in weight, skin, hair change, nocturia, and paresthesia, diabetic polys, visual blurring, hyper / hypo glycemic episodes.  Heme/Lymph: No excessive bleeding, bruising, enlarged lymph nodes.  Physical Exam  BP 132/80   Pulse 64   Temp 97.7 F (36.5 C)   Resp 16   Ht '5\' 2"'$  (1.575 m)   Wt 135 lb 6.4 oz (61.4 kg)   BMI 24.76 kg/m   General Appearance: Well nourished and in no apparent distress.  Eyes:  Pseudophakia, ou. PERRLA, EOMs, conjunctiva no swelling or erythema, normal fundi and vessels. Sinuses: No frontal/maxillary tenderness ENT/Mouth: EACs patent / TMs  nl. Nares clear without erythema, swelling, mucoid exudates. Oral hygiene is good. No erythema, swelling, or exudate. Tongue normal, non-obstructing. Tonsils not swollen or erythematous. Hearing normal.  Neck: Supple, thyroid normal. No bruits, nodes or JVD. Respiratory: Respiratory effort normal.  BS equal and clear bilateral without rales, rhonci, wheezing or stridor. Cardio: Heart sounds are normal with regular rate and rhythm and no murmurs, rubs or gallops. Peripheral pulses are normal and equal bilaterally without edema. No aortic or femoral bruits. Chest: symmetric with normal excursions and percussion. Breasts: Symmetric, without  lumps, nipple discharge, retractions, or fibrocystic changes.  Abdomen: Flat, soft with bowel sounds active. Nontender, no guarding, rebound, hernias, masses, or organomegaly.  Lymphatics: Non tender without lymphadenopathy.  Genitourinary:  Musculoskeletal: Full ROM all peripheral extremities, joint stability, 5/5 strength, and normal gait. Skin: Warm and dry without rashes, lesions, cyanosis, clubbing or  ecchymosis.  Neuro: Cranial nerves intact, reflexes equal bilaterally. Normal muscle tone, no cerebellar symptoms. Sensation intact.  Pysch: Alert and oriented X 3, normal affect, Insight and Judgment appropriate.   Assessment and Plan  1. Annual Preventative Screening Examination  -  Microalbumin / creatinine urine ratio - EKG 12-Lead - [place], RETROPERITNL ABD,  LTD - POC Hemoccult Bld/Stl  - Urinalysis, Routine w reflex microscopic  - CBC with Differential/Platelet - BASIC METABOLIC PANEL WITH GFR - Hepatic function panel - Magnesium - Lipid panel - TSH - Hemoglobin A1c - Insulin, random - VITAMIN D 25 Hydroxy  2. Essential hypertension  - Microalbumin / creatinine urine ratio - EKG 12-Lead - [place], RETROPERITNL ABD,  LTD - TSH  3. Hyperlipidemia  - EKG 12-Lead - [place], RETROPERITNL ABD,  LTD - Lipid panel - TSH  4. Prediabetes  - EKG 12-Lead - [place], RETROPERITNL ABD,  LTD - Hemoglobin A1c - Insulin, random  5. Vitamin D deficiency  - VITAMIN D 25 Hydroxy   6. Osteoporosis   7. Malignant neoplasm of upper lobe of lung, unspecified laterality ([place])   8. Encounter for screening for malignant neoplasm of rectum  - POC Hemoccult Bld/Stl  9. Screening for ischemic heart disease  - EKG 12-Lead  10. Screening for AAA (aortic abdominal aneurysm)  - [place], RETROPERITNL ABD,  LTD  11. Atherosclerosis of aorta ([facility])  - [place], RETROPERITNL ABD,  LTD  12. Medication management  - Urinalysis, Routine w reflex microscopic  - CBC with Differential/Platelet - BASIC METABOLIC PANEL WITH GFR - Hepatic function panel - Magnesium   Continue prudent diet as discussed, weight control, BP monitoring, regular exercise, and medications. Discussed med's effects and SE's. Screening labs and tests as requested with regular follow-up as recommended. Over 40 minutes of exam, counseling, chart review and high complex critical decision making was performed.

## 2016-06-30 NOTE — Patient Instructions (Signed)

## 2016-07-01 ENCOUNTER — Encounter: Payer: Self-pay | Admitting: Internal Medicine

## 2016-07-01 LAB — URINALYSIS, ROUTINE W REFLEX MICROSCOPIC
BILIRUBIN URINE: NEGATIVE
GLUCOSE, UA: NEGATIVE
HGB URINE DIPSTICK: NEGATIVE
Ketones, ur: NEGATIVE
LEUKOCYTES UA: NEGATIVE
Nitrite: NEGATIVE
PH: 6.5 (ref 5.0–8.0)
Protein, ur: NEGATIVE
Specific Gravity, Urine: 1.007 (ref 1.001–1.035)

## 2016-07-01 LAB — INSULIN, RANDOM: INSULIN: 2.9 u[IU]/mL (ref 2.0–19.6)

## 2016-07-01 LAB — VITAMIN D 25 HYDROXY (VIT D DEFICIENCY, FRACTURES): VIT D 25 HYDROXY: 67 ng/mL (ref 30–100)

## 2016-07-03 LAB — MICROALBUMIN / CREATININE URINE RATIO
CREATININE, URINE: 32 mg/dL (ref 20–320)
MICROALB UR: 0.5 mg/dL
MICROALB/CREAT RATIO: 16 ug/mg{creat} (ref <30)

## 2016-07-11 ENCOUNTER — Encounter (INDEPENDENT_AMBULATORY_CARE_PROVIDER_SITE_OTHER): Payer: Self-pay

## 2016-07-11 ENCOUNTER — Ambulatory Visit (HOSPITAL_COMMUNITY)
Admission: RE | Admit: 2016-07-11 | Discharge: 2016-07-11 | Disposition: A | Discharge status: Home / Self Care | Payer: Medicare Other | Source: Ambulatory Visit | Attending: Internal Medicine | Admitting: Internal Medicine

## 2016-07-11 DIAGNOSIS — Z85118 Personal history of other malignant neoplasm of bronchus and lung: Secondary | ICD-10-CM | POA: Insufficient documentation

## 2016-07-11 DIAGNOSIS — Z0000 Encounter for general adult medical examination without abnormal findings: Secondary | ICD-10-CM | POA: Diagnosis not present

## 2016-07-11 DIAGNOSIS — C3410 Malignant neoplasm of upper lobe, unspecified bronchus or lung: Secondary | ICD-10-CM

## 2016-07-11 DIAGNOSIS — Z87891 Personal history of nicotine dependence: Secondary | ICD-10-CM | POA: Insufficient documentation

## 2016-07-26 DIAGNOSIS — M25422 Effusion, left elbow: Secondary | ICD-10-CM | POA: Diagnosis not present

## 2016-08-04 ENCOUNTER — Other Ambulatory Visit: Payer: Self-pay | Admitting: Physician Assistant

## 2016-09-01 ENCOUNTER — Other Ambulatory Visit: Payer: Self-pay | Admitting: Internal Medicine

## 2016-09-06 DIAGNOSIS — M25422 Effusion, left elbow: Secondary | ICD-10-CM | POA: Diagnosis not present

## 2016-09-19 ENCOUNTER — Other Ambulatory Visit: Payer: Self-pay | Admitting: Internal Medicine

## 2016-09-30 ENCOUNTER — Other Ambulatory Visit: Payer: Self-pay | Admitting: Internal Medicine

## 2016-10-04 ENCOUNTER — Ambulatory Visit: Payer: Self-pay | Admitting: Internal Medicine

## 2016-10-11 ENCOUNTER — Ambulatory Visit (INDEPENDENT_AMBULATORY_CARE_PROVIDER_SITE_OTHER): Payer: Medicare Other | Admitting: Internal Medicine

## 2016-10-11 ENCOUNTER — Encounter: Payer: Self-pay | Admitting: Internal Medicine

## 2016-10-11 VITALS — BP 120/70 | HR 82 | Temp 98.4°F | Resp 16 | Ht 62.0 in | Wt 140.0 lb

## 2016-10-11 DIAGNOSIS — S39012A Strain of muscle, fascia and tendon of lower back, initial encounter: Secondary | ICD-10-CM

## 2016-10-11 DIAGNOSIS — R7303 Prediabetes: Secondary | ICD-10-CM

## 2016-10-11 DIAGNOSIS — E559 Vitamin D deficiency, unspecified: Secondary | ICD-10-CM | POA: Diagnosis not present

## 2016-10-11 DIAGNOSIS — I10 Essential (primary) hypertension: Secondary | ICD-10-CM

## 2016-10-11 DIAGNOSIS — L918 Other hypertrophic disorders of the skin: Secondary | ICD-10-CM | POA: Diagnosis not present

## 2016-10-11 DIAGNOSIS — E782 Mixed hyperlipidemia: Secondary | ICD-10-CM | POA: Diagnosis not present

## 2016-10-11 DIAGNOSIS — Z79899 Other long term (current) drug therapy: Secondary | ICD-10-CM

## 2016-10-11 DIAGNOSIS — M7020 Olecranon bursitis, unspecified elbow: Secondary | ICD-10-CM

## 2016-10-11 NOTE — Progress Notes (Signed)
Assessment and Plan:  Hypertension:  -Continue medication -monitor blood pressure at home. -Continue DASH diet -Reminder to go to the ER if any CP, SOB, nausea, dizziness, severe HA, changes vision/speech, left arm numbness and tingling and jaw pain.  Cholesterol - Continue diet and exercise  PreDiabetes without complications -Continue diet and exercise.   Vitamin D Def -continue medications.   Low back strain -cont tylenol prn -offered muscle relaxer which patient declined -recommended heating pad -gentle stretching prn  Skin tag -removed here in office -no concern for malignancy -did not send to pathology  Elbow bursitis -followed by Dr. Fredna Dow  Continue diet and meds as discussed. Further disposition pending results of labs. Discussed med's effects and SE's.    HPI 82 y.o. female  presents for 3 month follow up with hypertension, hyperlipidemia, diabetes and vitamin D deficiency.   Her blood pressure has been controlled at home, today their BP is BP: 120/70.She does not workout. She denies chest pain, shortness of breath, dizziness.   She is on cholesterol medication and denies myalgias. Her cholesterol is at goal. The cholesterol was:  06/30/2016: Cholesterol 161; HDL 80; LDL Cholesterol 68; Triglycerides 66   She has been working on diet and exercise for diabetes without complications, she is on bASA, she is not on ACE/ARB, and denies  foot ulcerations, hyperglycemia, hypoglycemia , increased appetite, nausea, paresthesia of the feet, polydipsia, polyuria, visual disturbances, vomiting and weight loss. Last A1C was: 06/30/2016: Hgb A1c MFr Bld 5.6   Patient is on Vitamin D supplement. 06/30/2016: Vit D, 25-Hydroxy 67  She reports that she has been having some low back pain which started when she went to pick up a heavy box yesterday.    She does have a mole on her left shoulder which does bleed occasionally.  She notes that it does seem like it might fall off at any  point in time.  She does have left elbow bursitis.  She reports that she has been seeing Dr. Fredna Dow for this and he may do a procedure for it.     Current Medications:  Current Outpatient Prescriptions on File Prior to Visit  Medication Sig Dispense Refill  . acyclovir (ZOVIRAX) 400 MG tablet Take 1 tablet (400 mg total) by mouth 3 (three) times daily. 90 tablet 0  . alendronate (FOSAMAX) 70 MG tablet TAKE 1 TABLET(70 MG) BY MOUTH 1 TIME A WEEK 12 tablet 3  . aspirin 81 MG chewable tablet Chew 81 mg by mouth daily.    . Cholecalciferol (VITAMIN D PO) Take 1-2 tablets by mouth See admin instructions. Vitamin d 5000 units per patient. Take twice every day except on Monday, Wednesday, and Fridays take 1 tablet    . diphenhydramine-acetaminophen (TYLENOL PM) 25-500 MG TABS Take 1 tablet by mouth at bedtime. Take every night per patient    . hydrochlorothiazide (HYDRODIURIL) 25 MG tablet TAKE 1 TABLET(25 MG) BY MOUTH DAILY 90 tablet 1  . magnesium gluconate (MAGONATE) 500 MG tablet Take 500 mg by mouth daily.     . metoprolol (LOPRESSOR) 50 MG tablet TAKE 1 TABLET BY MOUTH TWICE DAILY 180 tablet 1  . simvastatin (ZOCOR) 40 MG tablet TAKE 1 TABLET BY MOUTH EVERY NIGHT AT BEDTIME FOR CHOLESTEROL 90 tablet 0   No current facility-administered medications on file prior to visit.    Medical History:  Past Medical History:  Diagnosis Date  . COPD (chronic obstructive pulmonary disease) with emphysema ([place])   . Hyperlipidemia   .  Hypertension   . Lung cancer, upper lobe ([place]) 12/04/1996   RUL  . Osteoporosis   . S/P partial lobectomy of lung    [id number]  . Thigh shingles 12/04/1996  . Thoracic aorta atherosclerosis ([place])   . Vitamin D deficiency    Allergies:  Allergies  Allergen Reactions  . Ace Inhibitors Cough  . Calcium-Containing Compounds Other (See Comments)    constpation  . Lactose Intolerance (Gi) Diarrhea     Review of Systems:  Review of Systems  Constitutional: Negative  for chills, fever and malaise/fatigue.  HENT: Negative for congestion, ear pain and sore throat.   Eyes: Negative.   Respiratory: Negative for cough, shortness of breath and wheezing.   Cardiovascular: Negative for chest pain, palpitations and leg swelling.  Gastrointestinal: Negative for abdominal pain, blood in stool, constipation, diarrhea, heartburn and melena.  Genitourinary: Negative.   Skin: Negative.   Neurological: Negative for dizziness, sensory change, loss of consciousness and headaches.  Psychiatric/Behavioral: Negative for depression. The patient is not nervous/anxious and does not have insomnia.     Family history- Review and unchanged  Social history- Review and unchanged  Physical Exam: BP 120/70   Pulse 82   Temp 98.4 F (36.9 C) (Temporal)   Resp 16   Ht '5\' 2"'$  (1.575 m)   Wt 140 lb (63.5 kg)   BMI 25.61 kg/m  Wt Readings from Last 3 Encounters:  10/11/16 140 lb (63.5 kg)  06/30/16 135 lb 6.4 oz (61.4 kg)  06/09/16 136 lb 9.6 oz (62 kg)   General Appearance: Well nourished well developed, non-toxic appearing, in no apparent distress. Eyes: PERRLA, EOMs, conjunctiva no swelling or erythema ENT/Mouth: Ear canals clear with no erythema, swelling, or discharge.  TMs normal bilaterally, oropharynx clear, moist, with no exudate.   Neck: Supple, thyroid normal, no JVD, no cervical adenopathy.  Respiratory: Respiratory effort normal, breath sounds clear A&P, no wheeze, rhonchi or rales noted.  No retractions, no accessory muscle usage Cardio: RRR with no MRGs. No noted edema.  Abdomen: Soft, + BS.  Non tender, no guarding, rebound, hernias, masses. Musculoskeletal: Full ROM, 5/5 strength, Normal gait Skin: Warm, dry without rashes, lesions, ecchymosis.  Neuro: Awake and oriented X 3, Cranial nerves intact. No cerebellar symptoms.  Psych: normal affect, Insight and Judgment appropriate.    Starlyn Skeans, PA-C 10:04 AM [facility]

## 2016-12-17 ENCOUNTER — Other Ambulatory Visit: Payer: Self-pay | Admitting: Physician Assistant

## 2016-12-27 DIAGNOSIS — M25422 Effusion, left elbow: Secondary | ICD-10-CM | POA: Diagnosis not present

## 2016-12-28 ENCOUNTER — Other Ambulatory Visit: Payer: Self-pay | Admitting: Orthopedic Surgery

## 2017-01-10 ENCOUNTER — Ambulatory Visit: Payer: Self-pay | Admitting: Internal Medicine

## 2017-01-29 ENCOUNTER — Other Ambulatory Visit: Payer: Self-pay | Admitting: Internal Medicine

## 2017-01-29 ENCOUNTER — Ambulatory Visit (INDEPENDENT_AMBULATORY_CARE_PROVIDER_SITE_OTHER): Payer: Medicare Other | Admitting: Internal Medicine

## 2017-01-29 VITALS — BP 98/62 | HR 75 | Temp 98.4°F | Resp 16 | Wt 140.0 lb

## 2017-01-29 DIAGNOSIS — J069 Acute upper respiratory infection, unspecified: Secondary | ICD-10-CM | POA: Diagnosis not present

## 2017-01-29 MED ORDER — IPRATROPIUM BROMIDE 0.03 % NA SOLN: 2.0000 | Freq: Three times a day (TID) | NASAL | 2 refills | Status: DC

## 2017-01-29 MED ORDER — AZITHROMYCIN 250 MG PO TABS: ORAL_TABLET | ORAL | 0 refills | Status: DC

## 2017-01-29 MED ORDER — FLUTICASONE PROPIONATE 50 MCG/ACT NA SUSP: 2.0000 | Freq: Every day | NASAL | 0 refills | Status: DC

## 2017-01-29 MED ORDER — PROMETHAZINE-DM 6.25-15 MG/5ML PO SYRP: ORAL_SOLUTION | ORAL | 1 refills | Status: DC

## 2017-01-29 MED ORDER — PREDNISONE 20 MG PO TABS: ORAL_TABLET | ORAL | 0 refills | Status: DC

## 2017-01-29 NOTE — Patient Instructions (Signed)
Please continue to take the alka seltzer cough and cold every 6 hours as needed.  Please take claritin, zyrtec, or allegra once daily.  Please take flonase 2 sprays per nostril.  Please take prednisone with breakfast until it is gone.  Please use phenergan dm up to 3 times daily as needed for coughing.  Please take zpak if you develop colored mucous or consistent fever.  When this resolves you can start using atrovent nasal spray 2 sprays per nostril up to 3 times daily.

## 2017-01-29 NOTE — Progress Notes (Signed)
HPI  Patient presents to the office for evaluation of cough.  It has been going on for 3 days.  Patient reports night > day, dry, barky, worse with lying down.  They also endorse change in voice, chills, postnasal drip and nasal congestion, sore throat, headaches.  .  They have tried ibuprofen and alka seltzer cold and cough.  They report that nothing has worked.  They admits to other sick contacts.    Review of Systems  Constitutional: Positive for malaise/fatigue. Negative for chills and fever.  HENT: Positive for congestion, ear pain, hearing loss and sore throat.   Respiratory: Positive for cough. Negative for sputum production, shortness of breath and wheezing.   Cardiovascular: Negative for chest pain, palpitations and leg swelling.  Neurological: Positive for headaches.    PE:  Vitals:   01/29/17 1513  BP: 98/62  Pulse: 75  Resp: 16  Temp: 98.4 F (36.9 C)    General:  Alert and non-toxic, WDWN, NAD HEENT: NCAT, PERLA, EOM normal, no occular discharge or erythema.  Nasal mucosal edema with sinus tenderness to palpation.  Oropharynx clear with minimal oropharyngeal edema and erythema.  Mucous membranes moist and pink. Neck:  Cervical adenopathy Chest:  RRR no MRGs.  Lungs clear to auscultation A&P with no wheezes rhonchi or rales.   Abdomen: +BS x 4 quadrants, soft, non-tender, no guarding, rigidity, or rebound. Skin: warm and dry no rash Neuro: A&Ox4, CN II-XII grossly intact  Assessment and Plan:   1. Acute URI  - predniSONE (DELTASONE) 20 MG tablet; 3 tabs po daily x 3 days, then 2 tabs x 3 days, then 1.5 tabs x 3 days, then 1 tab x 3 days, then 0.5 tabs x 3 days  Dispense: 27 tablet; Refill: 0 - azithromycin (ZITHROMAX Z-PAK) 250 MG tablet; 2 po day one, then 1 daily x 4 days  Dispense: 6 tablet; Refill: 0 - promethazine-dextromethorphan (PROMETHAZINE-DM) 6.25-15 MG/5ML syrup; Take 5-10 ML PO q8hrs prn for cough  Dispense: 180 mL; Refill: 1 - ipratropium (ATROVENT)  0.03 % nasal spray; Place 2 sprays into the nose 3 (three) times daily.  Dispense: 30 mL; Refill: 2 - fluticasone (FLONASE) 50 MCG/ACT nasal spray; Place 2 sprays into both nostrils daily.  Dispense: 16 g; Refill: 0

## 2017-02-07 ENCOUNTER — Ambulatory Visit: Payer: Self-pay | Admitting: Internal Medicine

## 2017-02-07 ENCOUNTER — Ambulatory Visit (INDEPENDENT_AMBULATORY_CARE_PROVIDER_SITE_OTHER): Payer: Medicare Other | Admitting: Physician Assistant

## 2017-02-07 ENCOUNTER — Encounter: Payer: Self-pay | Admitting: Physician Assistant

## 2017-02-07 VITALS — BP 114/66 | HR 74 | Temp 97.5°F | Resp 16 | Ht 62.0 in | Wt 144.6 lb

## 2017-02-07 DIAGNOSIS — J439 Emphysema, unspecified: Secondary | ICD-10-CM

## 2017-02-07 DIAGNOSIS — I10 Essential (primary) hypertension: Secondary | ICD-10-CM

## 2017-02-07 DIAGNOSIS — M810 Age-related osteoporosis without current pathological fracture: Secondary | ICD-10-CM | POA: Diagnosis not present

## 2017-02-07 DIAGNOSIS — E782 Mixed hyperlipidemia: Secondary | ICD-10-CM | POA: Diagnosis not present

## 2017-02-07 DIAGNOSIS — Z79899 Other long term (current) drug therapy: Secondary | ICD-10-CM | POA: Diagnosis not present

## 2017-02-07 DIAGNOSIS — E559 Vitamin D deficiency, unspecified: Secondary | ICD-10-CM | POA: Diagnosis not present

## 2017-02-07 DIAGNOSIS — R7303 Prediabetes: Secondary | ICD-10-CM

## 2017-02-07 DIAGNOSIS — C3410 Malignant neoplasm of upper lobe, unspecified bronchus or lung: Secondary | ICD-10-CM | POA: Diagnosis not present

## 2017-02-07 DIAGNOSIS — R6889 Other general symptoms and signs: Secondary | ICD-10-CM | POA: Diagnosis not present

## 2017-02-07 DIAGNOSIS — I700 Atherosclerosis of aorta: Secondary | ICD-10-CM | POA: Diagnosis not present

## 2017-02-07 DIAGNOSIS — Z0001 Encounter for general adult medical examination with abnormal findings: Secondary | ICD-10-CM | POA: Diagnosis not present

## 2017-02-07 DIAGNOSIS — Z0000 Encounter for general adult medical examination without abnormal findings: Secondary | ICD-10-CM

## 2017-02-07 LAB — CBC WITH DIFFERENTIAL/PLATELET
Basophils Absolute: 0 cells/uL (ref 0–200)
Basophils Relative: 0 %
Eosinophils Absolute: 0 cells/uL — ABNORMAL LOW (ref 15–500)
Eosinophils Relative: 0 %
HCT: 41.7 % (ref 35.0–45.0)
Hemoglobin: 13.5 g/dL (ref 11.7–15.5)
LYMPHS PCT: 14 %
Lymphs Abs: 1288 cells/uL (ref 850–3900)
MCH: 28.2 pg (ref 27.0–33.0)
MCHC: 32.4 g/dL (ref 32.0–36.0)
MCV: 87.2 fL (ref 80.0–100.0)
MONOS PCT: 4 %
MPV: 10.1 fL (ref 7.5–12.5)
Monocytes Absolute: 368 cells/uL (ref 200–950)
Neutro Abs: 7544 cells/uL (ref 1500–7800)
Neutrophils Relative %: 82 %
PLATELETS: 224 10*3/uL (ref 140–400)
RBC: 4.78 MIL/uL (ref 3.80–5.10)
RDW: 14 % (ref 11.0–15.0)
WBC: 9.2 10*3/uL (ref 3.8–10.8)

## 2017-02-07 LAB — BASIC METABOLIC PANEL WITH GFR
BUN: 32 mg/dL — AB (ref 7–25)
CO2: 29 mmol/L (ref 20–31)
CREATININE: 0.83 mg/dL (ref 0.60–0.88)
Calcium: 9.9 mg/dL (ref 8.6–10.4)
Chloride: 100 mmol/L (ref 98–110)
GFR, EST AFRICAN AMERICAN: 76 mL/min (ref >=60)
GFR, Est Non African American: 66 mL/min (ref >=60)
Glucose, Bld: 145 mg/dL — ABNORMAL HIGH (ref 65–99)
POTASSIUM: 4.2 mmol/L (ref 3.5–5.3)
Sodium: 139 mmol/L (ref 135–146)

## 2017-02-07 LAB — HEPATIC FUNCTION PANEL
ALBUMIN: 3.8 g/dL (ref 3.6–5.1)
ALT: 31 U/L — ABNORMAL HIGH (ref 6–29)
AST: 27 U/L (ref 10–35)
Alkaline Phosphatase: 74 U/L (ref 33–130)
BILIRUBIN TOTAL: 0.4 mg/dL (ref 0.2–1.2)
Bilirubin, Direct: 0.1 mg/dL (ref <=0.2)
Indirect Bilirubin: 0.3 mg/dL (ref 0.2–1.2)
Total Protein: 6.6 g/dL (ref 6.1–8.1)

## 2017-02-07 LAB — LIPID PANEL
CHOLESTEROL: 182 mg/dL (ref <200)
HDL: 73 mg/dL (ref >50)
LDL Cholesterol: 90 mg/dL (ref <100)
TRIGLYCERIDES: 95 mg/dL (ref <150)
Total CHOL/HDL Ratio: 2.5 Ratio (ref <5.0)
VLDL: 19 mg/dL (ref <30)

## 2017-02-07 LAB — MAGNESIUM: Magnesium: 1.6 mg/dL (ref 1.5–2.5)

## 2017-02-07 LAB — TSH: TSH: 0.67 m[IU]/L

## 2017-02-07 NOTE — Patient Instructions (Addendum)
If keep getting dizziness with standing do every other day OR half a pill a day Wear compression stockings Increase fluids  [facility]  7 a.m.-6:30 p.m., Monday 7 a.m.-5 p.m., Tuesday-Friday Schedule an appointment by calling [phone].  Get bone density Get colonoscopy  Monitor your blood pressure at home. Go to the ER if any CP, SOB, nausea, dizziness, severe HA, changes vision/speech  Goal BP:  For patients younger than 60: Goal BP < 140/90. For patients 60 and older: Goal BP < 150/90. For patients with diabetes: Goal BP < 140/90. Your most recent BP: BP: 114/66   Take your medications faithfully as instructed. Maintain a healthy weight. Get at least 150 minutes of aerobic exercise per week. Minimize salt intake. Minimize alcohol intake  DASH Eating Plan DASH stands for "Dietary Approaches to Stop Hypertension." The DASH eating plan is a healthy eating plan that has been shown to reduce high blood pressure (hypertension). Additional health benefits may include reducing the risk of type 2 diabetes mellitus, heart disease, and stroke. The DASH eating plan may also help with weight loss. WHAT DO I NEED TO KNOW ABOUT THE DASH EATING PLAN? For the DASH eating plan, you will follow these general guidelines:  Choose foods with a percent daily value for sodium of less than 5% (as listed on the food label).  Use salt-free seasonings or herbs instead of table salt or sea salt.  Check with your health care provider or pharmacist before using salt substitutes.  Eat lower-sodium products, often labeled as "lower sodium" or "no salt added."  Eat fresh foods.  Eat more vegetables, fruits, and low-fat dairy products.  Choose whole grains. Look for the word "whole" as the first word in the ingredient list.  Choose fish and skinless chicken or [place] more often than red meat. Limit fish, poultry, and meat to 6 oz (170 g) each day.  Limit sweets,  desserts, sugars, and sugary drinks.  Choose heart-healthy fats.  Limit cheese to 1 oz (28 g) per day.  Eat more home-cooked food and less restaurant, buffet, and fast food.  Limit fried foods.  Cook foods using methods other than frying.  Limit canned vegetables. If you do use them, rinse them well to decrease the sodium.  When eating at a restaurant, ask that your food be prepared with less salt, or no salt if possible. WHAT FOODS CAN I EAT? Seek help from a dietitian for individual calorie needs. Grains Whole grain or whole wheat bread. Brown rice. Whole grain or whole wheat pasta. Quinoa, bulgur, and whole grain cereals. Low-sodium cereals. Corn or whole wheat flour tortillas. Whole grain cornbread. Whole grain crackers. Low-sodium crackers. Vegetables Fresh or frozen vegetables (raw, steamed, roasted, or grilled). Low-sodium or reduced-sodium tomato and vegetable juices. Low-sodium or reduced-sodium tomato sauce and paste. Low-sodium or reduced-sodium canned vegetables.  Fruits All fresh, canned (in natural juice), or frozen fruits. Meat and Other Protein Products Ground beef (85% or leaner), grass-fed beef, or beef trimmed of fat. Skinless chicken or [place]. Ground chicken or [place]. Pork trimmed of fat. All fish and seafood. Eggs. Dried beans, peas, or lentils. Unsalted nuts and seeds. Unsalted canned beans. Dairy Low-fat dairy products, such as skim or 1% milk, 2% or reduced-fat cheeses, low-fat ricotta or cottage cheese, or plain low-fat yogurt. Low-sodium or reduced-sodium cheeses. Fats and Oils Tub margarines without trans fats. Light or reduced-fat mayonnaise and salad dressings (reduced sodium). Avocado. Safflower, olive, or canola oils. Natural  peanut or almond butter. Other Unsalted popcorn and pretzels. The items listed above may not be a complete list of recommended foods or beverages. Contact your dietitian for more options. WHAT FOODS ARE NOT  RECOMMENDED? Grains White bread. White pasta. White rice. Refined cornbread. Bagels and croissants. Crackers that contain trans fat. Vegetables Creamed or fried vegetables. Vegetables in a cheese sauce. Regular canned vegetables. Regular canned tomato sauce and paste. Regular tomato and vegetable juices. Fruits Dried fruits. Canned fruit in light or heavy syrup. Fruit juice. Meat and Other Protein Products Fatty cuts of meat. Ribs, chicken wings, bacon, sausage, bologna, salami, chitterlings, fatback, hot dogs, bratwurst, and packaged luncheon meats. Salted nuts and seeds. Canned beans with salt. Dairy Whole or 2% milk, cream, half-and-half, and cream cheese. Whole-fat or sweetened yogurt. Full-fat cheeses or blue cheese. Nondairy creamers and whipped toppings. Processed cheese, cheese spreads, or cheese curds. Condiments Onion and garlic salt, seasoned salt, table salt, and sea salt. Canned and packaged gravies. Worcestershire sauce. Tartar sauce. Barbecue sauce. Teriyaki sauce. Soy sauce, including reduced sodium. Steak sauce. Fish sauce. Oyster sauce. Cocktail sauce. Horseradish. Ketchup and mustard. Meat flavorings and tenderizers. Bouillon cubes. Hot sauce. Tabasco sauce. Marinades. Taco seasonings. Relishes. Fats and Oils Butter, stick margarine, lard, shortening, ghee, and bacon fat. Coconut, palm kernel, or palm oils. Regular salad dressings. Other Pickles and olives. Salted popcorn and pretzels. The items listed above may not be a complete list of foods and beverages to avoid. Contact your dietitian for more information. WHERE CAN I FIND MORE INFORMATION? National Heart, Lung, and Blood Institute: travelstabloid.com Document Released: 11/09/2011 Document Revised: 04/06/2014 Document Reviewed: 09/24/2013 [facility] Patient Information 2015 [place], [place]. This information is not intended to replace advice given to you by your health care provider. Make  sure you discuss any questions you have with your health care provider.

## 2017-02-07 NOTE — Progress Notes (Signed)
MEDICARE ANNUAL WELLNESS VISIT AND FOLLOW UP  Assessment:    Essential hypertension - continue medications, DASH diet, exercise and monitor at home. Call if greater than 130/80.  - can cut HCTZ in half - CBC with Differential/Platelet - BASIC METABOLIC PANEL WITH GFR - Hepatic function panel - TSH   Hyperlipidemia -continue medications, check lipids, decrease fatty foods, increase activity.  - Lipid panel   Prediabetes Discussed general issues about diabetes pathophysiology and management., Educational material distributed., Suggested low cholesterol diet., Encouraged aerobic exercise., Discussed foot care., Reminded to get yearly retinal exam. - Hemoglobin A1c   Vitamin D deficiency - Vit D  25 hydroxy (rtn osteoporosis monitoring)   Medication management - Magnesium  COPD monitor   Atherosclerosis of aorta Control blood pressure, cholesterol, glucose, increase exercise.   Hx of cancer of lung CXR 06/2015 normal, continue follow up   Osteoporosis Continue vitamin D, Ca  Environmental allergies Allergic rhinitis- Allegra OTC, increase H20, allergy hygiene explained.  Future Appointments Date Time Provider [place]  07/27/2017 9:00 AM Unk Pinto, MD [facility] None     Plan:   During the course of the visit the patient was educated and counseled about appropriate screening and preventive services including:    Pneumococcal vaccine   Influenza vaccine  Td vaccine  Screening electrocardiogram  Screening mammography  Bone densitometry screening  Colorectal cancer screening  Diabetes screening  Glaucoma screening  Nutrition counseling   Advanced directives: given info/requested  Subjective:   Penny [name] is a 83 y.o. female who presents for Medicare Annual Wellness Visit and 3 month follow up on hypertension, prediabetes, hyperlipidemia, vitamin D def.   Her blood pressure has been controlled at home, today their BP is BP:  114/66 She does not workout but is very active and does try to walk, works 3 days a week, Youth worker. She denies chest pain, shortness of breath, dizziness.  She is on cholesterol medication, pravastatin 40 and denies myalgias. Her cholesterol is at goal. The cholesterol last visit was:   Lab Results  Component Value Date   CHOL 161 06/30/2016   HDL 80 06/30/2016   LDLCALC 68 06/30/2016   TRIG 66 06/30/2016   CHOLHDL 2.0 06/30/2016   She has been working on diet and exercise for prediabetes, and denies paresthesia of the feet, polydipsia and polyuria. Last A1C in the office was:  Lab Results  Component Value Date   HGBA1C 5.6 06/30/2016  Patient is on Vitamin D supplement. Lab Results  Component Value Date   VD25OH 67 06/30/2016        Medication Review Current Outpatient Prescriptions on File Prior to Visit  Medication Sig Dispense Refill  . acyclovir (ZOVIRAX) 400 MG tablet Take 1 tablet (400 mg total) by mouth 3 (three) times daily. 90 tablet 0  . alendronate (FOSAMAX) 70 MG tablet TAKE 1 TABLET(70 MG) BY MOUTH 1 TIME A WEEK 12 tablet 3  . aspirin 81 MG chewable tablet Chew 81 mg by mouth daily.    Marland Kitchen azithromycin (ZITHROMAX Z-PAK) 250 MG tablet 2 po day one, then 1 daily x 4 days 6 tablet 0  . Cholecalciferol (VITAMIN D PO) Take 1-2 tablets by mouth See admin instructions. Vitamin d 5000 units per patient. Take twice every day except on Monday, Wednesday, and Fridays take 1 tablet    . diphenhydramine-acetaminophen (TYLENOL PM) 25-500 MG TABS Take 1 tablet by mouth at bedtime. Take every night per patient    .  fluticasone (FLONASE) 50 MCG/ACT nasal spray '@1'$ -2 spray to nostrils daily 48 g 1  . hydrochlorothiazide (HYDRODIURIL) 25 MG tablet TAKE 1 TABLET(25 MG) BY MOUTH DAILY 90 tablet 1  . ipratropium (ATROVENT) 0.03 % nasal spray Place 2 sprays into the nose 3 (three) times daily. 30 mL 2  . metoprolol (LOPRESSOR) 50 MG tablet TAKE 1 TABLET BY MOUTH TWICE DAILY  180 tablet 1  . predniSONE (DELTASONE) 20 MG tablet 3 tabs po daily x 3 days, then 2 tabs x 3 days, then 1.5 tabs x 3 days, then 1 tab x 3 days, then 0.5 tabs x 3 days 27 tablet 0  . promethazine-dextromethorphan (PROMETHAZINE-DM) 6.25-15 MG/5ML syrup Take 5-10 ML PO q8hrs prn for cough 180 mL 1  . simvastatin (ZOCOR) 40 MG tablet TAKE 1 TABLET BY MOUTH EVERY NIGHT AT BEDTIME FOR CHOLESTEROL 90 tablet 1   No current facility-administered medications on file prior to visit.     Current Problems (verified) Patient Active Problem List   Diagnosis Date Noted  . Encounter for general adult medical examination with abnormal findings 06/30/2016  . Osteoporosis 12/28/2015  . COPD (chronic obstructive pulmonary disease) with emphysema ([place]) 02/25/2015  . Atherosclerosis of aorta ([place]) 02/25/2015  . Prediabetes 04/15/2014  . Medication management 04/15/2014  . Hypertension   . Hyperlipidemia   . Vitamin D deficiency   . Lung cancer, upper lobe ([place]) 12/04/1996    Immunization History  Administered Date(s) Administered  . Influenza, High Dose Seasonal PF 09/15/2015  . Influenza-Unspecified 08/04/2012, 09/03/2013  . Pneumococcal Conjugate-13 07/21/2014  . Pneumococcal Polysaccharide-23 10/30/2005  . Td 04/03/2013   Preventative care: Last colonoscopy: 2012 due 2017 DUE Last mammogram: 12/2015, will schedule this week Last pap smear/pelvic exam: 2012 negative DEXA: 02/2015 DUE with MGM Echo 2009 normal EF CXR 07/2016 CT cervical spin 10/2014 CT head 10/20/2014  Prior vaccinations: TD or Tdap: 2014  Influenza: 2016 Pneumococcal: 2006 Prevnar 13: 2015 Shingles/Zostavax: declines due to cost   Names of Other Physician/Practitioners you currently use: 1. [facility]- here for primary care 2. Dr. Santa Lighter , eye doctor, last visit 02/2016 3. Dr. Gilford Rile, dentist, last visit 07/2015 Patient Care Team: Unk Pinto, MD as PCP - General (Internal  Medicine) Druscilla Brownie, MD as Consulting Physician (Dermatology)- has appointment tomorrow Daryll Brod, MD as Consulting Physician (Orthopedic Surgery) Richmond Campbell, MD as Consulting Physician (Gastroenterology) Nicanor Alcon, MD as Attending Physician (Thoracic Surgery)  Allergies Allergies  Allergen Reactions  . Ace Inhibitors Cough  . Calcium-Containing Compounds Other (See Comments)    constpation  . Lactose Intolerance (Gi) Diarrhea    SURGICAL HISTORY She  has a past surgical history that includes Carpal tunnel release (Right); Shoulder adhesion release (Right); and Lung removal, partial (Right). FAMILY HISTORY Her family history includes Heart disease in her mother; Lung cancer in her father. SOCIAL HISTORY She  reports that she quit smoking about 20 years ago. She has never used smokeless tobacco. She reports that she drinks about 0.6 oz of alcohol per week . She reports that she does not use drugs.  MEDICARE WELLNESS OBJECTIVES: Physical activity:   Cardiac risk factors:   Depression/mood screen:   Depression screen [facility] 2/9 07/01/2016  Decreased Interest 0  Down, Depressed, Hopeless 0  PHQ - 2 Score 0    ADLs:  In your present state of health, do you have any difficulty performing the following activities: 07/01/2016  Hearing? N  Vision? N  Difficulty concentrating  or making decisions? N  Walking or climbing stairs? N  Dressing or bathing? N  Doing errands, shopping? N  Some recent data might be hidden     Cognitive Testing  Alert? Yes  Normal Appearance?Yes  Oriented to person? Yes  Place? Yes   Time? Yes  Recall of three objects?  Yes  Can perform simple calculations? Yes  Displays appropriate judgment?Yes  Can read the correct time from a watch face?Yes  EOL planning: Does Patient Have a Medical Advance Directive?: Yes Type of Advance Directive: Healthcare Power of Attorney, Living will [place] in Chart?: No - copy  requested   Objective:   Blood pressure 114/66, pulse 74, temperature 97.5 F (36.4 C), resp. rate 16, height '5\' 2"'$  (1.575 m), weight 144 lb 9.6 oz (65.6 kg), SpO2 95 %. Body mass index is 26.45 kg/m.  General appearance: alert, no distress, WD/WN,  female HEENT: normocephalic, sclerae anicteric, TMs pearly, nares patent, no discharge or erythema, pharynx normal Oral cavity: MMM, no lesions Neck: supple, no lymphadenopathy, no thyromegaly, no masses Heart: RRR, normal S1, S2, no murmurs Lungs: CTA bilaterally, no wheezes, rhonchi, or rales Abdomen: +bs, soft, non tender, non distended, no masses, no hepatomegaly, no splenomegaly Musculoskeletal:  no swelling, no obvious deformity Extremities: no edema, no cyanosis, no clubbing Pulses: 2+ symmetric, upper and lower extremities, normal cap refill Neurological: alert, oriented x 3, CN2-12 intact, strength normal upper extremities and lower extremities, sensation normal throughout, DTRs 2+ throughout, no cerebellar signs, gait normal Psychiatric: normal affect, behavior normal, pleasant  Breast: defer Gyn: defer Rectal: defer  Medicare Attestation I have personally reviewed: The patient's medical and social history Their use of alcohol, tobacco or illicit drugs Their current medications and supplements The patient's functional ability including ADLs,fall risks, home safety risks, cognitive, and hearing and visual impairment Diet and physical activities Evidence for depression or mood disorders  The patient's weight, height, BMI, and visual acuity have been recorded in the chart.  I have made referrals, counseling, and provided education to the patient based on review of the above and I have provided the patient with a written personalized care plan for preventive services.     Vicie Mutters, PA-C   02/07/2017

## 2017-02-08 NOTE — Progress Notes (Signed)
LVM for pt to return office call for LAB results.

## 2017-02-09 ENCOUNTER — Other Ambulatory Visit: Payer: Self-pay | Admitting: Physician Assistant

## 2017-02-09 ENCOUNTER — Other Ambulatory Visit: Payer: Self-pay | Admitting: Internal Medicine

## 2017-02-09 DIAGNOSIS — Z1231 Encounter for screening mammogram for malignant neoplasm of breast: Secondary | ICD-10-CM

## 2017-02-13 ENCOUNTER — Telehealth: Payer: Self-pay | Admitting: *Deleted

## 2017-02-13 NOTE — Telephone Encounter (Signed)
Patient aware of lab results and instructions.  Patient notes she has lowered her Magnesium dose d/t increased diarrhea symptoms.  She states she will add additional magnesium supplement back slowly.

## 2017-02-14 DIAGNOSIS — L821 Other seborrheic keratosis: Secondary | ICD-10-CM | POA: Diagnosis not present

## 2017-02-14 DIAGNOSIS — L570 Actinic keratosis: Secondary | ICD-10-CM | POA: Diagnosis not present

## 2017-02-14 DIAGNOSIS — D235 Other benign neoplasm of skin of trunk: Secondary | ICD-10-CM | POA: Diagnosis not present

## 2017-02-14 DIAGNOSIS — L814 Other melanin hyperpigmentation: Secondary | ICD-10-CM | POA: Diagnosis not present

## 2017-02-19 NOTE — Progress Notes (Signed)
Pt aware of lab results & voiced understanding of those results. Pt states Magnesium causes her to have diarrhea so she only takes it 3 times a week.

## 2017-02-28 ENCOUNTER — Inpatient Hospital Stay: Admission: RE | Admit: 2017-02-28 | Payer: Medicare Other | Source: Ambulatory Visit

## 2017-02-28 ENCOUNTER — Ambulatory Visit: Payer: Self-pay

## 2017-02-28 ENCOUNTER — Ambulatory Visit
Admission: RE | Admit: 2017-02-28 | Discharge: 2017-02-28 | Disposition: A | Discharge status: Home / Self Care | Payer: Medicare Other | Source: Ambulatory Visit | Attending: Physician Assistant | Admitting: Physician Assistant

## 2017-02-28 ENCOUNTER — Other Ambulatory Visit: Payer: Self-pay

## 2017-02-28 DIAGNOSIS — M810 Age-related osteoporosis without current pathological fracture: Secondary | ICD-10-CM | POA: Diagnosis not present

## 2017-02-28 DIAGNOSIS — Z1231 Encounter for screening mammogram for malignant neoplasm of breast: Secondary | ICD-10-CM

## 2017-02-28 DIAGNOSIS — M8589 Other specified disorders of bone density and structure, multiple sites: Secondary | ICD-10-CM | POA: Diagnosis not present

## 2017-03-05 ENCOUNTER — Telehealth: Payer: Self-pay

## 2017-03-05 ENCOUNTER — Other Ambulatory Visit: Payer: Self-pay | Admitting: Internal Medicine

## 2017-03-05 NOTE — Telephone Encounter (Signed)
Informed pt of DEXA results.

## 2017-03-12 ENCOUNTER — Encounter: Payer: Self-pay | Admitting: *Deleted

## 2017-03-16 ENCOUNTER — Encounter (HOSPITAL_BASED_OUTPATIENT_CLINIC_OR_DEPARTMENT_OTHER): Payer: Self-pay | Admitting: *Deleted

## 2017-03-19 NOTE — Anesthesia Preprocedure Evaluation (Signed)
Anesthesia Evaluation  Patient identified by MRN, date of birth, ID band Patient awake    Reviewed: Allergy & Precautions, NPO status , Patient's Chart, lab work & pertinent test results  Airway Mallampati: II  TM Distance: >3 FB Neck ROM: Full    Dental no notable dental hx.    Pulmonary COPD, former smoker,    Pulmonary exam normal breath sounds clear to auscultation       Cardiovascular hypertension, Pt. on medications + Peripheral Vascular Disease  Normal cardiovascular exam Rhythm:Regular Rate:Normal     Neuro/Psych negative neurological ROS  negative psych ROS   GI/Hepatic negative GI ROS, Neg liver ROS,   Endo/Other  negative endocrine ROS  Renal/GU negative Renal ROS     Musculoskeletal negative musculoskeletal ROS (+)   Abdominal   Peds  Hematology negative hematology ROS (+)   Anesthesia Other Findings   Reproductive/Obstetrics negative OB ROS                             Anesthesia Physical Anesthesia Plan  ASA: III  Anesthesia Plan: Regional   Post-op Pain Management:    Induction:   Airway Management Planned:   Additional Equipment:   Intra-op Plan:   Post-operative Plan:   Informed Consent: I have reviewed the patients History and Physical, chart, labs and discussed the procedure including the risks, benefits and alternatives for the proposed anesthesia with the patient or authorized representative who has indicated his/her understanding and acceptance.   Dental advisory given  Plan Discussed with: CRNA  Anesthesia Plan Comments:         Anesthesia Quick Evaluation

## 2017-03-20 ENCOUNTER — Encounter (HOSPITAL_BASED_OUTPATIENT_CLINIC_OR_DEPARTMENT_OTHER): Payer: Self-pay | Admitting: *Deleted

## 2017-03-20 ENCOUNTER — Ambulatory Visit (HOSPITAL_BASED_OUTPATIENT_CLINIC_OR_DEPARTMENT_OTHER): Payer: Medicare Other | Admitting: Anesthesiology

## 2017-03-20 ENCOUNTER — Ambulatory Visit (HOSPITAL_BASED_OUTPATIENT_CLINIC_OR_DEPARTMENT_OTHER)
Admission: RE | Admit: 2017-03-20 | Discharge: 2017-03-20 | Disposition: A | Discharge status: Home / Self Care | Payer: Medicare Other | Source: Ambulatory Visit | Attending: Orthopedic Surgery | Admitting: Orthopedic Surgery

## 2017-03-20 ENCOUNTER — Encounter (HOSPITAL_BASED_OUTPATIENT_CLINIC_OR_DEPARTMENT_OTHER)
Admission: RE | Disposition: A | Discharge status: Home / Self Care | Payer: Self-pay | Source: Ambulatory Visit | Attending: Orthopedic Surgery

## 2017-03-20 DIAGNOSIS — Z79899 Other long term (current) drug therapy: Secondary | ICD-10-CM | POA: Insufficient documentation

## 2017-03-20 DIAGNOSIS — E559 Vitamin D deficiency, unspecified: Secondary | ICD-10-CM | POA: Insufficient documentation

## 2017-03-20 DIAGNOSIS — Z87891 Personal history of nicotine dependence: Secondary | ICD-10-CM | POA: Insufficient documentation

## 2017-03-20 DIAGNOSIS — M7022 Olecranon bursitis, left elbow: Secondary | ICD-10-CM | POA: Insufficient documentation

## 2017-03-20 DIAGNOSIS — I739 Peripheral vascular disease, unspecified: Secondary | ICD-10-CM | POA: Insufficient documentation

## 2017-03-20 DIAGNOSIS — E785 Hyperlipidemia, unspecified: Secondary | ICD-10-CM | POA: Insufficient documentation

## 2017-03-20 DIAGNOSIS — J439 Emphysema, unspecified: Secondary | ICD-10-CM | POA: Insufficient documentation

## 2017-03-20 DIAGNOSIS — M810 Age-related osteoporosis without current pathological fracture: Secondary | ICD-10-CM | POA: Insufficient documentation

## 2017-03-20 DIAGNOSIS — Z902 Acquired absence of lung [part of]: Secondary | ICD-10-CM | POA: Diagnosis not present

## 2017-03-20 DIAGNOSIS — I10 Essential (primary) hypertension: Secondary | ICD-10-CM | POA: Diagnosis not present

## 2017-03-20 DIAGNOSIS — Z7982 Long term (current) use of aspirin: Secondary | ICD-10-CM | POA: Diagnosis not present

## 2017-03-20 DIAGNOSIS — M7032 Other bursitis of elbow, left elbow: Secondary | ICD-10-CM | POA: Diagnosis not present

## 2017-03-20 DIAGNOSIS — Z85118 Personal history of other malignant neoplasm of bronchus and lung: Secondary | ICD-10-CM | POA: Insufficient documentation

## 2017-03-20 HISTORY — PX: OLECRANON BURSECTOMY: SHX2097

## 2017-03-20 LAB — POCT I-STAT, CHEM 8
BUN: 26 mg/dL — AB (ref 6–20)
CHLORIDE: 102 mmol/L (ref 101–111)
Calcium, Ion: 1.15 mmol/L (ref 1.15–1.40)
Creatinine, Ser: 0.8 mg/dL (ref 0.44–1.00)
Glucose, Bld: 116 mg/dL — ABNORMAL HIGH (ref 65–99)
HEMATOCRIT: 42 % (ref 36.0–46.0)
Hemoglobin: 14.3 g/dL (ref 12.0–15.0)
POTASSIUM: 3.9 mmol/L (ref 3.5–5.1)
SODIUM: 142 mmol/L (ref 135–145)
TCO2: 30 mmol/L (ref 0–100)

## 2017-03-20 SURGERY — BURSECTOMY, ELBOW
Anesthesia: Regional | Site: Elbow | Laterality: Left

## 2017-03-20 MED ORDER — OXYCODONE HCL 5 MG/5ML PO SOLN: 5.0000 mg | Freq: Once | ORAL | Status: DC | PRN

## 2017-03-20 MED ORDER — HYDROCODONE-ACETAMINOPHEN 5-325 MG PO TABS: 1.0000 | ORAL_TABLET | Freq: Four times a day (QID) | ORAL | 0 refills | Status: DC | PRN

## 2017-03-20 MED ORDER — FENTANYL CITRATE (PF) 100 MCG/2ML IJ SOLN
INTRAMUSCULAR | Status: AC
  Filled 2017-03-20: qty 2

## 2017-03-20 MED ORDER — PROPOFOL 500 MG/50ML IV EMUL
INTRAVENOUS | Status: DC | PRN
  Administered 2017-03-20: 50 ug/kg/min via INTRAVENOUS

## 2017-03-20 MED ORDER — LACTATED RINGERS IV SOLN
INTRAVENOUS | Status: DC
  Administered 2017-03-20: 08:00:00 via INTRAVENOUS

## 2017-03-20 MED ORDER — LIDOCAINE 2% (20 MG/ML) 5 ML SYRINGE
INTRAMUSCULAR | Status: DC | PRN
  Administered 2017-03-20: 20 mg via INTRAVENOUS

## 2017-03-20 MED ORDER — CEFAZOLIN SODIUM-DEXTROSE 2-4 GM/100ML-% IV SOLN
2.0000 g | INTRAVENOUS | Status: AC
  Administered 2017-03-20: 2 g via INTRAVENOUS

## 2017-03-20 MED ORDER — HYDROMORPHONE HCL 1 MG/ML IJ SOLN: 0.2500 mg | INTRAMUSCULAR | Status: DC | PRN

## 2017-03-20 MED ORDER — PROMETHAZINE HCL 25 MG/ML IJ SOLN: 6.2500 mg | INTRAMUSCULAR | Status: DC | PRN

## 2017-03-20 MED ORDER — DEXAMETHASONE SODIUM PHOSPHATE 10 MG/ML IJ SOLN
INTRAMUSCULAR | Status: AC
  Filled 2017-03-20: qty 1

## 2017-03-20 MED ORDER — ONDANSETRON HCL 4 MG/2ML IJ SOLN
INTRAMUSCULAR | Status: AC
  Filled 2017-03-20: qty 14

## 2017-03-20 MED ORDER — FENTANYL CITRATE (PF) 100 MCG/2ML IJ SOLN
50.0000 ug | INTRAMUSCULAR | Status: DC | PRN
  Administered 2017-03-20: 50 ug via INTRAVENOUS

## 2017-03-20 MED ORDER — LIDOCAINE HCL (PF) 2 % IJ SOLN
INTRAMUSCULAR | Status: DC | PRN
  Administered 2017-03-20: 10 mL via PERINEURAL

## 2017-03-20 MED ORDER — BUPIVACAINE HCL (PF) 0.5 % IJ SOLN
INTRAMUSCULAR | Status: AC
  Filled 2017-03-20: qty 30

## 2017-03-20 MED ORDER — OXYCODONE HCL 5 MG PO TABS: 5.0000 mg | ORAL_TABLET | Freq: Once | ORAL | Status: DC | PRN

## 2017-03-20 MED ORDER — CHLORHEXIDINE GLUCONATE 4 % EX LIQD: 60.0000 mL | Freq: Once | CUTANEOUS | Status: DC

## 2017-03-20 MED ORDER — SCOPOLAMINE 1 MG/3DAYS TD PT72: 1.0000 | MEDICATED_PATCH | Freq: Once | TRANSDERMAL | Status: DC | PRN

## 2017-03-20 MED ORDER — MEPERIDINE HCL 25 MG/ML IJ SOLN: 6.2500 mg | INTRAMUSCULAR | Status: DC | PRN

## 2017-03-20 MED ORDER — BUPIVACAINE-EPINEPHRINE (PF) 0.5% -1:200000 IJ SOLN
INTRAMUSCULAR | Status: DC | PRN
  Administered 2017-03-20: 30 mL via PERINEURAL

## 2017-03-20 MED ORDER — MIDAZOLAM HCL 2 MG/2ML IJ SOLN: 1.0000 mg | INTRAMUSCULAR | Status: DC | PRN

## 2017-03-20 MED ORDER — CEFAZOLIN SODIUM-DEXTROSE 2-4 GM/100ML-% IV SOLN
INTRAVENOUS | Status: AC
  Filled 2017-03-20: qty 100

## 2017-03-20 SURGICAL SUPPLY — 57 items
BLADE MINI RND TIP GREEN BEAV (BLADE) IMPLANT
BLADE SURG 15 STRL LF DISP TIS (BLADE) ×1 IMPLANT
BLADE SURG 15 STRL SS (BLADE) ×2
BNDG CMPR 9X4 STRL LF SNTH (GAUZE/BANDAGES/DRESSINGS) ×1
BNDG COHESIVE 3X5 TAN STRL LF (GAUZE/BANDAGES/DRESSINGS) ×4 IMPLANT
BNDG ESMARK 4X9 LF (GAUZE/BANDAGES/DRESSINGS) ×2 IMPLANT
BNDG GAUZE ELAST 4 BULKY (GAUZE/BANDAGES/DRESSINGS) ×2 IMPLANT
CHLORAPREP W/TINT 26ML (MISCELLANEOUS) ×2 IMPLANT
CORDS BIPOLAR (ELECTRODE) ×2 IMPLANT
COVER BACK TABLE 60X90IN (DRAPES) ×2 IMPLANT
COVER MAYO STAND STRL (DRAPES) ×2 IMPLANT
CUFF TOURNIQUET SINGLE 18IN (TOURNIQUET CUFF) IMPLANT
DRAPE EXTREMITY T 121X128X90 (DRAPE) ×2 IMPLANT
DRAPE SURG 17X23 STRL (DRAPES) ×2 IMPLANT
DRSG PAD ABDOMINAL 8X10 ST (GAUZE/BANDAGES/DRESSINGS) ×2 IMPLANT
GAUZE SPONGE 4X4 12PLY STRL (GAUZE/BANDAGES/DRESSINGS) ×2 IMPLANT
GAUZE SPONGE 4X4 16PLY XRAY LF (GAUZE/BANDAGES/DRESSINGS) IMPLANT
GAUZE XEROFORM 1X8 LF (GAUZE/BANDAGES/DRESSINGS) ×2 IMPLANT
GLOVE BIO SURGEON STRL SZ 6.5 (GLOVE) ×1 IMPLANT
GLOVE BIOGEL PI IND STRL 7.0 (GLOVE) IMPLANT
GLOVE BIOGEL PI IND STRL 7.5 (GLOVE) IMPLANT
GLOVE BIOGEL PI IND STRL 8.5 (GLOVE) ×1 IMPLANT
GLOVE BIOGEL PI INDICATOR 7.0 (GLOVE) ×2
GLOVE BIOGEL PI INDICATOR 7.5 (GLOVE) ×1
GLOVE BIOGEL PI INDICATOR 8.5 (GLOVE) ×1
GLOVE SURG ORTHO 8.0 STRL STRW (GLOVE) ×2 IMPLANT
GLOVE SURG SS PI 7.0 STRL IVOR (GLOVE) ×1 IMPLANT
GOWN STRL REUS W/ TWL LRG LVL3 (GOWN DISPOSABLE) ×1 IMPLANT
GOWN STRL REUS W/TWL LRG LVL3 (GOWN DISPOSABLE) ×2
GOWN STRL REUS W/TWL XL LVL3 (GOWN DISPOSABLE) ×2 IMPLANT
NDL PRECISIONGLIDE 27X1.5 (NEEDLE) IMPLANT
NDL SUT 6 .5 CRC .975X.05 MAYO (NEEDLE) IMPLANT
NEEDLE MAYO TAPER (NEEDLE)
NEEDLE PRECISIONGLIDE 27X1.5 (NEEDLE) IMPLANT
NS IRRIG 1000ML POUR BTL (IV SOLUTION) ×2 IMPLANT
PACK BASIN DAY SURGERY FS (CUSTOM PROCEDURE TRAY) ×2 IMPLANT
PAD CAST 3X4 CTTN HI CHSV (CAST SUPPLIES) ×1 IMPLANT
PAD CAST 4YDX4 CTTN HI CHSV (CAST SUPPLIES) ×1 IMPLANT
PADDING CAST ABS 4INX4YD NS (CAST SUPPLIES) ×1
PADDING CAST ABS COTTON 4X4 ST (CAST SUPPLIES) ×1 IMPLANT
PADDING CAST COTTON 3X4 STRL (CAST SUPPLIES) ×2
PADDING CAST COTTON 4X4 STRL (CAST SUPPLIES) ×2
SLEEVE SCD COMPRESS KNEE MED (MISCELLANEOUS) IMPLANT
SPLINT PLASTER CAST XFAST 3X15 (CAST SUPPLIES) IMPLANT
SPLINT PLASTER XTRA FASTSET 3X (CAST SUPPLIES)
STOCKINETTE 4X48 STRL (DRAPES) ×2 IMPLANT
SUT BONE WAX W31G (SUTURE) IMPLANT
SUT CHROMIC 4 0 P 3 18 (SUTURE) IMPLANT
SUT ETHILON 4 0 PS 2 18 (SUTURE) ×2 IMPLANT
SUT MERSILENE 3 0 FS 1 (SUTURE) IMPLANT
SUT VIC AB 4-0 RB1 27 (SUTURE)
SUT VIC AB 4-0 RB1 27X BRD (SUTURE) IMPLANT
SUT VICRYL 4-0 PS2 18IN ABS (SUTURE) IMPLANT
SYR BULB 3OZ (MISCELLANEOUS) ×2 IMPLANT
SYR CONTROL 10ML LL (SYRINGE) ×1 IMPLANT
TOWEL OR 17X24 6PK STRL BLUE (TOWEL DISPOSABLE) ×4 IMPLANT
UNDERPAD 30X30 (UNDERPADS AND DIAPERS) ×2 IMPLANT

## 2017-03-20 NOTE — Op Note (Signed)
Dictation Number [id number]

## 2017-03-20 NOTE — Anesthesia Procedure Notes (Signed)
Procedure Name: MAC Date/Time: 03/20/2017 8:35 AM Performed by: Faun Mcqueen D Pre-anesthesia Checklist: Patient identified, Emergency Drugs available, Suction available, Patient being monitored and Timeout performed Patient Re-evaluated:Patient Re-evaluated prior to inductionOxygen Delivery Method: Simple face mask

## 2017-03-20 NOTE — Brief Op Note (Signed)
03/20/2017  9:13 AM  PATIENT:  Bonnye Fava Tavares  83 y.o. female  PRE-OPERATIVE DIAGNOSIS:  BURSITIS LEFT ELBOW  POST-OPERATIVE DIAGNOSIS:  BURSITIS LEFT ELBOW  PROCEDURE:  Procedure(s) with comments: EXICISION OLECRANON BURSA LEFT ELBOW (Left) - with scb block in preop  SURGEON:  Surgeon(s) and Role:    * Daryll Brod, MD - Primary  PHYSICIAN ASSISTANT:   ASSISTANTS: none   ANESTHESIA:   regional and IV sedation  EBL:  Total I/O In: 500 [I.V.:500] Out: -   BLOOD ADMINISTERED:none  DRAINS: none   LOCAL MEDICATIONS USED:  NONE  SPECIMEN:  Excision  DISPOSITION OF SPECIMEN:  PATHOLOGY  COUNTS:  YES  TOURNIQUET:   Total Tourniquet Time Documented: Upper Arm (Left) - 22 minutes Total: Upper Arm (Left) - 22 minutes   DICTATION: .Other Dictation: Dictation Number [id number]  PLAN OF CARE: Discharge to home after PACU  PATIENT DISPOSITION:  PACU - hemodynamically stable.

## 2017-03-20 NOTE — Discharge Instructions (Addendum)

## 2017-03-20 NOTE — Anesthesia Postprocedure Evaluation (Signed)
Anesthesia Post Note  Patient: Penny [name]  Procedure(s) Performed: Procedure(s) (LRB): EXICISION OLECRANON BURSA LEFT ELBOW (Left)  Patient location during evaluation: PACU Anesthesia Type: Regional Level of consciousness: awake and alert Pain management: pain level controlled Vital Signs Assessment: post-procedure vital signs reviewed and stable Respiratory status: spontaneous breathing Cardiovascular status: stable Anesthetic complications: no       Last Vitals:  Vitals:   03/20/17 0930 03/20/17 0945  BP: (!) 141/81 138/68  Pulse: 68 66  Resp: 12 11  Temp:      Last Pain:  Vitals:   03/20/17 0945  TempSrc:   PainSc: 0-No pain                 Nolon Nations

## 2017-03-20 NOTE — Transfer of Care (Signed)
Immediate Anesthesia Transfer of Care Note  Patient: Penny [name]  Procedure(s) Performed: Procedure(s) with comments: EXICISION OLECRANON BURSA LEFT ELBOW (Left) - with scb block in preop  Patient Location: PACU  Anesthesia Type:MAC  Level of Consciousness: awake, alert , oriented and patient cooperative  Airway & Oxygen Therapy: Patient Spontanous Breathing and Patient connected to face mask oxygen  Post-op Assessment: Report given to RN and Post -op Vital signs reviewed and stable  Post vital signs: Reviewed and stable  Last Vitals:  Vitals:   03/20/17 0817 03/20/17 0818  BP: (!) 157/78   Pulse: 65 68  Resp: 13 13  Temp:      Last Pain:  Vitals:   03/20/17 0731  TempSrc: Oral      Patients Stated Pain Goal: 0 (16/38/45 3646)  Complications: No apparent anesthesia complications

## 2017-03-20 NOTE — Progress Notes (Signed)
Assisted Dr. Lissa Hoard with left, ultrasound guided, supraclavicular block. Side rails up, monitors on throughout procedure. See vital signs in flow sheet. Tolerated Procedure well.

## 2017-03-20 NOTE — H&P (Signed)
Penny [name] is an 83 y.o. female.   Chief Complaint: mass left elbow HPI: Penny [name] is an 83 year old right hand dominant female  with a complaint of swelling of her left elbow. This has been going on for approximately 2 weeks. She complains of the mass over the posterior aspect. She is referred by Dr. Melford Aase. She was given a Prednisone Dosepak and ice pack. She is not complaining of any pain. She has no history of injury. She states that the Prednisone Dosepak has caused little change. The ice pack has also caused little change to the mass. It has not responded to conservative treatment. She would like to have it removed.          Past Medical History:  Diagnosis Date  . COPD (chronic obstructive pulmonary disease) with emphysema ([place])   . Hyperlipidemia   . Hypertension   . Lung cancer, upper lobe ([place]) 12/04/1996   RUL  . Osteoporosis   . S/P partial lobectomy of lung    [id number]  . Thigh shingles 12/04/1996  . Thoracic aorta atherosclerosis ([place])   . Vitamin D deficiency     Past Surgical History:  Procedure Laterality Date  . CARPAL TUNNEL RELEASE Right    2009  . LUNG REMOVAL, PARTIAL Right    RUL 1998  . SHOULDER ADHESION RELEASE Right    2004    Family History  Problem Relation Age of Onset  . Heart disease Mother   . Lung cancer Father   . Breast cancer Maternal Aunt    Social History:  reports that she quit smoking about 20 years ago. She has never used smokeless tobacco. She reports that she drinks about 0.6 oz of alcohol per week . She reports that she does not use drugs.  Allergies:  Allergies  Allergen Reactions  . Ace Inhibitors Cough  . Calcium-Containing Compounds Other (See Comments)    constpation  . Lactose Intolerance (Gi) Diarrhea    No prescriptions prior to admission.    No results found for this or any previous visit (from the past 48 hour(s)).  No results found.   Pertinent items are noted in HPI.  Height 5' 2.5" (1.588 m),  weight 65.3 kg (144 lb).  General appearance: alert, cooperative and appears stated age Head: Normocephalic, without obvious abnormality Neck: no JVD Resp: clear to auscultation bilaterally Cardio: regular rate and rhythm, S1, S2 normal, no murmur, click, rub or gallop GI: soft, non-tender; bowel sounds normal; no masses,  no organomegaly Extremities: mass left elbow Pulses: 2+ and symmetric Skin: Skin color, texture, turgor normal. No rashes or lesions Neurologic: Grossly normal Incision/Wound: na  Assessment/Plan Assessment:  1. Effusion of left olecranon bursa    Plan: Diagnosis olecranon bursitis left elbow. Plan she would like to have this surgically excised. Pre-peri-and postoperative course are discussed along with risk complications. She is aware that there is no guarantee to the surgery the possibility of infection recurrence injury to arteries nerves tendons incomplete release symptoms dystrophy possibility stiffness to the elbow. Possibility of recurrence. Questions are encouraged and answered to her satisfaction. She is scheduled for a left olecranon bursa as an outpatient under regional anesthesia.      Penny [name] R 03/20/2017, 4:56 AM

## 2017-03-20 NOTE — Op Note (Signed)
NAME:  Seeling, Jadwiga                  ACCOUNT NO.:  [id number]  MEDICAL RECORD NO.:  [id number]  LOCATION:                                 FACILITY:  PHYSICIAN:  Daryll Brod, M.D.            DATE OF BIRTH:  DATE OF PROCEDURE:  03/20/2017 DATE OF DISCHARGE:                              OPERATIVE REPORT   PREOPERATIVE DIAGNOSIS:  Olecranon bursitis, left elbow.  POSTOPERATIVE DIAGNOSIS:  Olecranon bursitis, left elbow.  OPERATION:  Excision of olecranon bursa, left elbow.  SURGEON:  Daryll Brod, MD.  ANESTHESIA:  Axillary block.  PLACE OF SURGERY:  [facility].  HISTORY:  The patient is an 83 year old female with history of a large olecranon bursa of her left elbow.  This has not responded to conservative treatment, and she has elected to undergo surgical excision.  Pre, peri, and postoperative course have been discussed along with risks and complications.  She is aware that there is no guarantee to the surgery; the possibility of infection; recurrence of injury to arteries, nerves, tendons; incomplete relief of symptoms and dystrophy. In the preoperative area, the patient is seen, the extremity marked by both patient and surgeon.  Antibiotic given.  PROCEDURE IN DETAIL:  The patient was brought to the operating room, where axillary block was carried out without difficulty.  She was prepped using ChloraPrep in a supine position with the left arm free.  A 3-minute dry time was allowed and time-out taken confirming the patient and procedure.  The limb was exsanguinated with an Esmarch bandage and tourniquet placed on the upper arm, was inflated to 250 mmHg.  A longitudinal incision was made over the posterior aspect of the elbow, carried down through subcutaneous tissue.  Bleeders were electrocauterized with bipolar.  A large cystic mass was immediately encountered.  With blunt and sharp dissection, this was dissected free from the surrounding tissue.  Care was taken on  the medial aspect to protect any area near the ulnar nerve.  The large mass was then opened draining out a clear fluid.  The mass was then excised using blunt and sharp dissection, dissecting it off from the olecranon with sharp dissection.  The specimen was sent to Pathology.  The wound was copiously irrigated with saline.  The subcutaneous tissue was then closed with interrupted 4-0 Vicryl sutures.  The skin was closed with interrupted 4-0 nylon sutures.  A sterile compressive dressing and long- arm splint were applied.  On deflation of the tourniquet, all fingers immediately pinked.  She was taken to the recovery room for observation in satisfactory condition.  She will be discharged home to return to the [facility] in 1 week on Norco.          ______________________________ Daryll Brod, M.D.     GK/MEDQ  D:  03/20/2017  T:  03/20/2017  Job:  [id number]

## 2017-03-20 NOTE — Anesthesia Procedure Notes (Addendum)
Anesthesia Regional Block: Supraclavicular block   Pre-Anesthetic Checklist: ,, timeout performed, Correct Patient, Correct Site, Correct Laterality, Correct Procedure, Correct Position, site marked, Risks and benefits discussed,  Surgical consent,  Pre-op evaluation,  At surgeon's request and post-op pain management  Laterality: Left  Prep: chloraprep       Needles:  Injection technique: Single-shot  Needle Type: Stimiplex          Additional Needles:   Procedures: ultrasound guided,,,,,,,,  Narrative:  Start time: 03/20/2017 8:11 AM End time: 03/20/2017 8:16 AM Injection made incrementally with aspirations every 5 mL.  Performed by: Personally  Anesthesiologist: Nolon Nations  Additional Notes: Risks, benefits and alternative to block explained extensively.  Patient tolerated procedure well, without complications.

## 2017-03-20 NOTE — Addendum Note (Signed)
Addendum  created 03/20/17 1330 by Ernesta Amble Sabrea Sankey, CRNA   Charge Capture section accepted

## 2017-03-22 ENCOUNTER — Encounter (HOSPITAL_BASED_OUTPATIENT_CLINIC_OR_DEPARTMENT_OTHER): Payer: Self-pay | Admitting: Orthopedic Surgery

## 2017-03-26 NOTE — Addendum Note (Signed)
Addendum  created 03/26/17 1632 by Nolon Nations, MD   Anesthesia Intra Blocks edited, Sign clinical note

## 2017-04-24 ENCOUNTER — Other Ambulatory Visit: Payer: Self-pay

## 2017-04-24 DIAGNOSIS — J069 Acute upper respiratory infection, unspecified: Secondary | ICD-10-CM

## 2017-04-24 MED ORDER — IPRATROPIUM BROMIDE 0.03 % NA SOLN: 2.0000 | Freq: Three times a day (TID) | NASAL | 2 refills | Status: DC

## 2017-05-12 ENCOUNTER — Other Ambulatory Visit: Payer: Self-pay | Admitting: Physician Assistant

## 2017-06-26 ENCOUNTER — Other Ambulatory Visit: Payer: Self-pay | Admitting: Internal Medicine

## 2017-07-11 DIAGNOSIS — M25422 Effusion, left elbow: Secondary | ICD-10-CM | POA: Diagnosis not present

## 2017-07-11 DIAGNOSIS — M65312 Trigger thumb, left thumb: Secondary | ICD-10-CM | POA: Diagnosis not present

## 2017-07-11 DIAGNOSIS — M180 Bilateral primary osteoarthritis of first carpometacarpal joints: Secondary | ICD-10-CM | POA: Diagnosis not present

## 2017-07-18 DIAGNOSIS — M180 Bilateral primary osteoarthritis of first carpometacarpal joints: Secondary | ICD-10-CM | POA: Diagnosis not present

## 2017-07-18 DIAGNOSIS — M79645 Pain in left finger(s): Secondary | ICD-10-CM | POA: Diagnosis not present

## 2017-07-27 ENCOUNTER — Ambulatory Visit (INDEPENDENT_AMBULATORY_CARE_PROVIDER_SITE_OTHER): Payer: Medicare Other | Admitting: Internal Medicine

## 2017-07-27 ENCOUNTER — Ambulatory Visit (HOSPITAL_COMMUNITY)
Admission: RE | Admit: 2017-07-27 | Discharge: 2017-07-27 | Disposition: A | Discharge status: Home / Self Care | Payer: Medicare Other | Source: Ambulatory Visit | Attending: Internal Medicine | Admitting: Internal Medicine

## 2017-07-27 VITALS — BP 124/80 | HR 64 | Temp 97.5°F | Resp 16 | Ht 62.0 in | Wt 140.2 lb

## 2017-07-27 DIAGNOSIS — Z0000 Encounter for general adult medical examination without abnormal findings: Secondary | ICD-10-CM | POA: Diagnosis not present

## 2017-07-27 DIAGNOSIS — Z0001 Encounter for general adult medical examination with abnormal findings: Secondary | ICD-10-CM

## 2017-07-27 DIAGNOSIS — E782 Mixed hyperlipidemia: Secondary | ICD-10-CM | POA: Diagnosis not present

## 2017-07-27 DIAGNOSIS — E559 Vitamin D deficiency, unspecified: Secondary | ICD-10-CM | POA: Diagnosis not present

## 2017-07-27 DIAGNOSIS — Z136 Encounter for screening for cardiovascular disorders: Secondary | ICD-10-CM | POA: Diagnosis not present

## 2017-07-27 DIAGNOSIS — J439 Emphysema, unspecified: Secondary | ICD-10-CM | POA: Diagnosis not present

## 2017-07-27 DIAGNOSIS — Z1212 Encounter for screening for malignant neoplasm of rectum: Secondary | ICD-10-CM

## 2017-07-27 DIAGNOSIS — R7303 Prediabetes: Secondary | ICD-10-CM

## 2017-07-27 DIAGNOSIS — I10 Essential (primary) hypertension: Secondary | ICD-10-CM | POA: Insufficient documentation

## 2017-07-27 DIAGNOSIS — Z1211 Encounter for screening for malignant neoplasm of colon: Secondary | ICD-10-CM

## 2017-07-27 DIAGNOSIS — Z79899 Other long term (current) drug therapy: Secondary | ICD-10-CM

## 2017-07-27 MED ORDER — TERBINAFINE HCL 250 MG PO TABS: ORAL_TABLET | ORAL | 0 refills | Status: AC

## 2017-07-27 NOTE — Patient Instructions (Signed)

## 2017-07-27 NOTE — Progress Notes (Signed)
[facility] Unk Pinto, M.D.      Uvaldo Bristle. Silverio Lay, P.A.-C [facility]                [address] [place] [address] Telephone [phone] Telefax [phone]  Annual Screening/Preventative Visit & Comprehensive Evaluation &  Examination     This very nice 83 y.o. [facility] presents for a Screening/Preventative Visit & comprehensive evaluation and management of multiple medical co-morbidities.  Patient has been followed for HTN,  Hx/o Prediabetes, Hyperlipidemia and Vitamin D Deficiency. Patient is s/p  RULobectomy (1998) for Ca and remains in remission. Patient had Lt Elbow surgery 2 weeks ago by Dr Fredna Dow and now is being followed for Trigger Lt Thumb.      HTN predates since 2000. Patient's BP has been controlled at home and patient denies any cardiac symptoms as chest pain, palpitations, shortness of breath, dizziness or ankle swelling. Today's BP is at goal - 124/80      Patient's hyperlipidemia is controlled with diet and medications. Patient denies myalgias or other medication SE's. Last lipids were at goal: Lab Results  Component Value Date   CHOL 182 02/07/2017   HDL 73 02/07/2017   LDLCALC 90 02/07/2017   TRIG 95 02/07/2017   CHOLHDL 2.5 02/07/2017      Patient has prediabetes (A1c 5.9% in 2009) and patient denies reactive hypoglycemic symptoms, visual blurring, diabetic polys, or paresthesias. Last A1c was at goal: Lab Results  Component Value Date   HGBA1C 5.6 06/30/2016      Patient has Osteoporosis ands is on Fosamax. Finally, patient has history of Vitamin D Deficiency ("26" in 2008) and last Vitamin D was at goal: Lab Results  Component Value Date   VD25OH 67 06/30/2016   Current Outpatient Prescriptions on File Prior to Visit  Medication Sig  . acyclovir (ZOVIRAX) 400 MG tablet Take 1 tablet (400 mg total) by mouth 3 (three) times daily.  Marland Kitchen alendronate (FOSAMAX) 70 MG  tablet TAKE 1 TABLET(70 MG) BY MOUTH 1 TIME A WEEK  . aspirin 81 MG chewable tablet Chew 81 mg by mouth daily.  . Cholecalciferol (VITAMIN D PO) Take 1-2 tablets by mouth See admin instructions. Vitamin d 5000 units per patient. Take twice every day except on Monday, Wednesday, and Fridays take 1 tablet  . diphenhydramine-acetaminophen (TYLENOL PM) 25-500 MG TABS Take 1 tablet by mouth at bedtime. Take every night per patient  . hydrochlorothiazide (HYDRODIURIL) 25 MG tablet TAKE 1 TABLET(25 MG) BY MOUTH DAILY (Patient taking differently: TAKE 1 TABLET EVERY OTHER DAY)  . ipratropium (ATROVENT) 0.03 % nasal spray Place 2 sprays into the nose 3 (three) times daily.  . metoprolol tartrate (LOPRESSOR) 50 MG tablet TAKE 1 TABLET BY MOUTH TWICE DAILY  . simvastatin (ZOCOR) 40 MG tablet TAKE 1 TABLET BY MOUTH EVERY NIGHT AT BEDTIME FOR CHOLESTEROL   No current facility-administered medications on file prior to visit.    Allergies  Allergen Reactions  . Ace Inhibitors Cough  . Calcium-Containing Compounds Other (See Comments)    constpation  . Lactose Intolerance (Gi) Diarrhea  . Polysporin [Bacitracin-Polymyxin B] Rash    Polysporin or neosporin gave her a rash, couldn't remember which   Past Medical History:  Diagnosis Date  . COPD (chronic obstructive pulmonary disease) with emphysema ([place])   . Hyperlipidemia   . Hypertension   .  Lung cancer, upper lobe ([place]) 12/04/1996   RUL  . Osteoporosis   . S/P partial lobectomy of lung    [id number]  . Thigh shingles 12/04/1996  . Thoracic aorta atherosclerosis ([place])   . Vitamin D deficiency    Health Maintenance  Topic Date Due  . INFLUENZA VACCINE  07/04/2017  . TETANUS/TDAP  04/04/2023  . DEXA SCAN  Completed  . PNA vac Low Risk Adult  Completed   Immunization History  Administered Date(s) Administered  . Influenza, High Dose Seasonal PF 09/15/2015  . Influenza-Unspecified 08/04/2012, 09/03/2013  . Pneumococcal Conjugate-13 07/21/2014   . Pneumococcal Polysaccharide-23 10/30/2005  . Td 04/03/2013   Past Surgical History:  Procedure Laterality Date  . CARPAL TUNNEL RELEASE Right    2009  . LUNG REMOVAL, PARTIAL Right    RUL 1998  . OLECRANON BURSECTOMY Left 03/20/2017   Procedure: EXICISION OLECRANON BURSA LEFT ELBOW;  Surgeon: Daryll Brod, MD;  Location: [facility];  Service: Orthopedics;  Laterality: Left;  with scb block in preop  . SHOULDER ADHESION RELEASE Right    2004   Family History  Problem Relation Age of Onset  . Heart disease Mother   . Lung cancer Father   . Breast cancer Maternal Aunt    Social History  Substance Use Topics  . Smoking status: Former Smoker    Quit date: 12/04/1996  . Smokeless tobacco: Never Used  . Alcohol use 0.6 oz/week    1 Glasses of wine per week     Comment: social    ROS Constitutional: Denies fever, chills, weight loss/gain, headaches, insomnia,  night sweats, and change in appetite. Does c/o fatigue. Eyes: Denies redness, blurred vision, diplopia, discharge, itchy, watery eyes.  ENT: Denies discharge, congestion, post nasal drip, epistaxis, sore throat, earache, hearing loss, dental pain, Tinnitus, Vertigo, Sinus pain, snoring.  Cardio: Denies chest pain, palpitations, irregular heartbeat, syncope, dyspnea, diaphoresis, orthopnea, PND, claudication, edema Respiratory: denies cough, dyspnea, DOE, pleurisy, hoarseness, laryngitis, wheezing.  Gastrointestinal: Denies dysphagia, heartburn, reflux, water brash, pain, cramps, nausea, vomiting, bloating, diarrhea, constipation, hematemesis, melena, hematochezia, jaundice, hemorrhoids Genitourinary: Denies dysuria, frequency, urgency, nocturia, hesitancy, discharge, hematuria, flank pain Breast: Breast lumps, nipple discharge, bleeding.  Musculoskeletal: Denies arthralgia, myalgia, stiffness, Jt. Swelling, pain, limp, and strain/sprain. Denies falls. Skin: Denies puritis, rash, hives, warts, acne, eczema, changing  in skin lesion Neuro: No weakness, tremor, incoordination, spasms, paresthesia, pain Psychiatric: Denies confusion, memory loss, sensory loss. Denies Depression. Endocrine: Denies change in weight, skin, hair change, nocturia, and paresthesia, diabetic polys, visual blurring, hyper / hypo glycemic episodes.  Heme/Lymph: No excessive bleeding, bruising, enlarged lymph nodes.  Physical Exam  BP 124/80   Pulse 64   Temp (!) 97.5 F (36.4 C)   Resp 16   Ht 5\' 2"  (1.575 m)   Wt 140 lb 3.2 oz (63.6 kg)   BMI 25.64 kg/m   General Appearance: Well nourished, well groomed and in no apparent distress.  Eyes: PERRLA, EOMs, conjunctiva no swelling or erythema, normal fundi and vessels. Sinuses: No frontal/maxillary tenderness ENT/Mouth: EACs patent / TMs  nl. Nares clear without erythema, swelling, mucoid exudates. Oral hygiene is good. No erythema, swelling, or exudate. Tongue normal, non-obstructing. Tonsils not swollen or erythematous. Hearing normal.  Neck: Supple, thyroid normal. No bruits, nodes or JVD. Respiratory: Respiratory effort normal.  BS equal and clear bilateral without rales, rhonci, wheezing or stridor. Cardio: Heart sounds are normal with regular rate and rhythm and no murmurs, rubs or gallops.  Peripheral pulses are normal and equal bilaterally without edema. No aortic or femoral bruits. Chest: symmetric with normal excursions and percussion. Breasts: Symmetric, without lumps, nipple discharge, retractions, or fibrocystic changes.  Abdomen: Flat, soft with bowel sounds active. Nontender, no guarding, rebound, hernias, masses, or organomegaly.  Lymphatics: Non tender without lymphadenopathy.   Musculoskeletal: Full ROM all peripheral extremities, joint stability, 5/5 strength, and normal gait. Skin: Warm and dry without rashes, lesions, cyanosis, clubbing or  ecchymosis.  Neuro: Cranial nerves intact, reflexes equal bilaterally. Normal muscle tone, no cerebellar symptoms.  Sensation intact.  Pysch: Alert and oriented X 3, normal affect, Insight and Judgment appropriate.   Assessment and Plan  1. Annual Preventative Screening Examination  2. Essential hypertension  - EKG 12-Lead - Urinalysis, Routine w reflex microscopic - Microalbumin / creatinine urine ratio - CBC with Differential/Platelet - BASIC METABOLIC PANEL WITH GFR - Magnesium - TSH - DG Chest 2 View; Future  3. Hyperlipidemia, mixed  - EKG 12-Lead - Hepatic function panel - Lipid panel - TSH  4. Prediabetes  - EKG 12-Lead - Hemoglobin A1c - Insulin, fasting  5. Vitamin D deficiency  - VITAMIN D 25 Hydroxy   6. Pulmonary emphysema ([place])  - DG Chest 2 View; Future  7. Screening for ischemic heart disease  - EKG 12-Lead  8. Encounter for colorectal cancer screening  - POC Hemoccult Bld/Stl  9. Medication management  - Urinalysis, Routine w reflex microscopic - Microalbumin / creatinine urine ratio - CBC with Differential/Platelet - BASIC METABOLIC PANEL WITH GFR - Hepatic function panel - Magnesium - Lipid panel - TSH - Hemoglobin A1c - Insulin, fasting - VITAMIN D 25 Hydroxy        Patient was counseled in prudent diet to achieve/maintain BMI less than 25 for weight control, BP monitoring, regular exercise and medications. Discussed med's effects and SE's. Screening labs and tests as requested with regular follow-up as recommended. Over 40 minutes of exam, counseling, chart review and high complex critical decision making was performed.

## 2017-07-29 ENCOUNTER — Encounter: Payer: Self-pay | Admitting: Internal Medicine

## 2017-08-02 LAB — HEMOGLOBIN A1C
Hgb A1c MFr Bld: 5.7 % of total Hgb — ABNORMAL HIGH (ref <5.7)
Mean Plasma Glucose: 117 (calc)
eAG (mmol/L): 6.5 (calc)

## 2017-08-02 LAB — CBC WITH DIFFERENTIAL/PLATELET
BASOS ABS: 19 {cells}/uL (ref 0–200)
BASOS PCT: 0.4 %
EOS ABS: 110 {cells}/uL (ref 15–500)
EOS PCT: 2.3 %
HEMATOCRIT: 43.1 % (ref 35.0–45.0)
HEMOGLOBIN: 13.8 g/dL (ref 11.7–15.5)
LYMPHS ABS: 1435 {cells}/uL (ref 850–3900)
MCH: 27.7 pg (ref 27.0–33.0)
MCHC: 32 g/dL (ref 32.0–36.0)
MCV: 86.5 fL (ref 80.0–100.0)
MONOS PCT: 10.9 %
MPV: 11.1 fL (ref 7.5–12.5)
NEUTROS ABS: 2712 {cells}/uL (ref 1500–7800)
Neutrophils Relative %: 56.5 %
Platelets: 181 10*3/uL (ref 140–400)
RBC: 4.98 10*6/uL (ref 3.80–5.10)
RDW: 13 % (ref 11.0–15.0)
Total Lymphocyte: 29.9 %
WBC mixed population: 523 cells/uL (ref 200–950)
WBC: 4.8 10*3/uL (ref 3.8–10.8)

## 2017-08-02 LAB — URINE CULTURE
MICRO NUMBER: 80926713
RESULT: NO GROWTH
SPECIMEN QUALITY:: ADEQUATE

## 2017-08-02 LAB — LIPID PANEL
CHOL/HDL RATIO: 2.5 (calc) (ref <5.0)
CHOLESTEROL: 188 mg/dL (ref <200)
HDL: 74 mg/dL (ref >50)
LDL CHOLESTEROL (CALC): 96 mg/dL
NON-HDL CHOLESTEROL (CALC): 114 mg/dL (ref <130)
Triglycerides: 86 mg/dL (ref <150)

## 2017-08-02 LAB — TSH: TSH: 0.95 mIU/L (ref 0.40–4.50)

## 2017-08-02 LAB — BASIC METABOLIC PANEL WITH GFR
BUN: 22 mg/dL (ref 7–25)
CHLORIDE: 100 mmol/L (ref 98–110)
CO2: 30 mmol/L (ref 20–32)
Calcium: 10 mg/dL (ref 8.6–10.4)
Creat: 0.7 mg/dL (ref 0.60–0.88)
GFR, Est African American: 94 mL/min/{1.73_m2} (ref >=60)
GFR, Est Non African American: 81 mL/min/{1.73_m2} (ref >=60)
GLUCOSE: 96 mg/dL (ref 65–99)
Potassium: 4.1 mmol/L (ref 3.5–5.3)
SODIUM: 141 mmol/L (ref 135–146)

## 2017-08-02 LAB — HEPATIC FUNCTION PANEL
AG RATIO: 1.6 (calc) (ref 1.0–2.5)
ALKALINE PHOSPHATASE (APISO): 56 U/L (ref 33–130)
ALT: 13 U/L (ref 6–29)
AST: 22 U/L (ref 10–35)
Albumin: 4.5 g/dL (ref 3.6–5.1)
BILIRUBIN INDIRECT: 0.5 mg/dL (ref 0.2–1.2)
Bilirubin, Direct: 0.1 mg/dL (ref 0.0–0.2)
Globulin: 2.8 g/dL (calc) (ref 1.9–3.7)
TOTAL PROTEIN: 7.3 g/dL (ref 6.1–8.1)
Total Bilirubin: 0.6 mg/dL (ref 0.2–1.2)

## 2017-08-02 LAB — URINALYSIS, ROUTINE W REFLEX MICROSCOPIC
Bilirubin Urine: NEGATIVE
Glucose, UA: NEGATIVE
HGB URINE DIPSTICK: NEGATIVE
KETONES UR: NEGATIVE
Leukocytes, UA: NEGATIVE
NITRITE: NEGATIVE
Protein, ur: NEGATIVE
Specific Gravity, Urine: 1.008 (ref 1.001–1.03)
pH: 6.5 (ref 5.0–8.0)

## 2017-08-02 LAB — MICROALBUMIN / CREATININE URINE RATIO
CREATININE, URINE: 32 mg/dL (ref 20–320)
MICROALB UR: 0.6 mg/dL
Microalb Creat Ratio: 19 mcg/mg creat (ref <30)

## 2017-08-02 LAB — VITAMIN D 25 HYDROXY (VIT D DEFICIENCY, FRACTURES): Vit D, 25-Hydroxy: 83 ng/mL (ref 30–100)

## 2017-08-02 LAB — MAGNESIUM: MAGNESIUM: 1.6 mg/dL (ref 1.5–2.5)

## 2017-08-02 LAB — INSULIN, FASTING: Insulin: 2.6 u[IU]/mL (ref 2.0–19.6)

## 2017-08-08 DIAGNOSIS — M180 Bilateral primary osteoarthritis of first carpometacarpal joints: Secondary | ICD-10-CM | POA: Diagnosis not present

## 2017-08-08 DIAGNOSIS — M65312 Trigger thumb, left thumb: Secondary | ICD-10-CM | POA: Diagnosis not present

## 2017-09-11 ENCOUNTER — Other Ambulatory Visit: Payer: Self-pay | Admitting: Internal Medicine

## 2017-10-02 ENCOUNTER — Ambulatory Visit (INDEPENDENT_AMBULATORY_CARE_PROVIDER_SITE_OTHER): Payer: Medicare Other

## 2017-10-02 DIAGNOSIS — Z23 Encounter for immunization: Secondary | ICD-10-CM

## 2017-10-02 NOTE — Progress Notes (Signed)
Patient presents to the office for HD Flu vaccine. Vaccination administered in right deltoid without any complications. No questions or concerns.

## 2017-10-08 ENCOUNTER — Other Ambulatory Visit: Payer: Self-pay | Admitting: Internal Medicine

## 2017-11-06 ENCOUNTER — Other Ambulatory Visit: Payer: Self-pay | Admitting: Internal Medicine

## 2017-11-13 NOTE — Progress Notes (Signed)
FOLLOW UP  Assessment and Plan:   Hypertension Well controlled with current medications  Monitor blood pressure at home; patient to call if consistently greater than 130/80 Continue DASH diet.   Reminder to go to the ER if any CP, SOB, nausea, dizziness, severe HA, changes vision/speech, left arm numbness and tingling and jaw pain.  Cholesterol Continue medication  Continue low cholesterol diet and exercise.  Check lipid panel.   Prediabetes Discussed disease and risks Discussed diet/exercise, weight management A1C  Vitamin D Def/ osteoporosis prevention Continue supplementation Check Vit D level  COPD/emphysema Stable without treatment; continue to monitor   Atherosclerosis of aorta Control blood pressure, cholesterol, glucose, increase exercise.    Continue diet and meds as discussed. Further disposition pending results of labs. Discussed med's effects and SE's.   Over 30 minutes of exam, counseling, chart review, and critical decision making was performed.   Future Appointments  Date Time Provider [place]  02/20/2018 10:30 AM Unk Pinto, MD [facility] None  08/20/2018  9:00 AM Unk Pinto, MD [facility] None    ----------------------------------------------------------------------------------------------------------------------  HPI 83 y.o. female with hx of RUL lung CA s/p partial lobectomy in 1998; presents for 3 month follow up on COPD/emphysema, hypertension, cholesterol, prediabetes and vitamin D deficiency. COPD/emphysema noted on CT chest 2016 is currently stable without treatment.   Her blood pressure has been controlled at home, today their BP is BP: 118/68  She does workout. She denies chest pain, shortness of breath, dizziness.   She is on cholesterol medication and denies myalgias. Her cholesterol is at goal. The cholesterol last visit was:   Lab Results  Component Value Date   CHOL 188 07/27/2017   HDL 74 07/27/2017   LDLCALC 90  02/07/2017   TRIG 86 07/27/2017   CHOLHDL 2.5 07/27/2017    She has been working on diet and exercise for prediabetes, and denies nausea, paresthesia of the feet, polydipsia, polyuria, visual disturbances and vomiting. Last A1C in the office was:  Lab Results  Component Value Date   HGBA1C 5.7 (H) 07/27/2017   Patient is on Vitamin D supplement and at goal:    Lab Results  Component Value Date   VD25OH 83 07/27/2017       Current Medications:  Current Outpatient Medications on File Prior to Visit  Medication Sig  . acyclovir (ZOVIRAX) 400 MG tablet Take 1 tablet (400 mg total) by mouth 3 (three) times daily.  Marland Kitchen alendronate (FOSAMAX) 70 MG tablet TAKE 1 TABLET(70 MG) BY MOUTH 1 TIME A WEEK  . aspirin 81 MG chewable tablet Chew 81 mg by mouth daily.  . Cholecalciferol (VITAMIN D PO) Take 1-2 tablets by mouth See admin instructions. Vitamin d 5000 units per patient. Take twice every day except on Monday, Wednesday, and Fridays take 1 tablet  . diphenhydramine-acetaminophen (TYLENOL PM) 25-500 MG TABS Take 1 tablet by mouth at bedtime. Take every night per patient  . hydrochlorothiazide (HYDRODIURIL) 25 MG tablet TAKE 1 TABLET(25 MG) BY MOUTH DAILY  . ipratropium (ATROVENT) 0.03 % nasal spray Place 2 sprays into the nose 3 (three) times daily.  . metoprolol tartrate (LOPRESSOR) 50 MG tablet TAKE 1 TABLET BY MOUTH TWICE DAILY  . simvastatin (ZOCOR) 40 MG tablet TAKE 1 TABLET BY MOUTH EVERY NIGHT AT BEDTIME FOR CHOLESTEROL   No current facility-administered medications on file prior to visit.      Allergies:  Allergies  Allergen Reactions  . Ace Inhibitors Cough  . Calcium-Containing Compounds Other (See Comments)  constpation  . Lactose Intolerance (Gi) Diarrhea  . Polysporin [Bacitracin-Polymyxin B] Rash    Polysporin or neosporin gave her a rash, couldn't remember which     Medical History:  Past Medical History:  Diagnosis Date  . COPD (chronic obstructive pulmonary  disease) with emphysema ([place])   . Hyperlipidemia   . Hypertension   . Lung cancer, upper lobe ([place]) 12/04/1996   RUL  . Osteoporosis   . S/P partial lobectomy of lung    [id number]  . Thigh shingles 12/04/1996  . Thoracic aorta atherosclerosis ([place])   . Vitamin D deficiency    Family history- Reviewed and unchanged Social history- Reviewed and unchanged   Review of Systems:  Review of Systems  Constitutional: Negative for malaise/fatigue and weight loss.  HENT: Negative for hearing loss and tinnitus.   Eyes: Negative for blurred vision and double vision.  Respiratory: Negative for cough, shortness of breath and wheezing.   Cardiovascular: Negative for chest pain, palpitations, orthopnea, claudication and leg swelling.  Gastrointestinal: Negative for abdominal pain, blood in stool, constipation, diarrhea, heartburn, melena, nausea and vomiting.  Genitourinary: Negative.   Musculoskeletal: Negative for joint pain and myalgias.  Skin: Negative for rash.  Neurological: Negative for dizziness, tingling, sensory change, weakness and headaches.  Endo/Heme/Allergies: Negative for polydipsia.  Psychiatric/Behavioral: Negative.   All other systems reviewed and are negative.     Physical Exam: BP 118/68   Pulse 65   Temp 97.9 F (36.6 C)   Ht 5\' 2"  (1.575 m)   Wt 139 lb (63 kg)   SpO2 97%   BMI 25.42 kg/m  Wt Readings from Last 3 Encounters:  11/14/17 139 lb (63 kg)  07/27/17 140 lb 3.2 oz (63.6 kg)  03/20/17 142 lb 12.8 oz (64.8 kg)   General Appearance: Well nourished, in no apparent distress. Eyes: PERRLA, EOMs, conjunctiva no swelling or erythema Sinuses: No Frontal/maxillary tenderness ENT/Mouth: Ext aud canals clear, TMs without erythema, bulging. No erythema, swelling, or exudate on post pharynx.  Tonsils not swollen or erythematous. Hearing normal.  Neck: Supple, thyroid normal.  Respiratory: Respiratory effort normal, BS equal bilaterally without rales, rhonchi,  wheezing or stridor.  Cardio: RRR with no MRGs. Brisk peripheral pulses without edema.  Abdomen: Soft, + BS.  Non tender, no guarding, rebound, hernias, masses. Lymphatics: Non tender without lymphadenopathy.  Musculoskeletal: Full ROM, 5/5 strength, Normal gait Skin: Warm, dry without rashes, lesions, ecchymosis.  Neuro: Cranial nerves intact. No cerebellar symptoms.  Psych: Awake and oriented X 3, normal affect, Insight and Judgment appropriate.    Izora Ribas, NP 10:47 AM [facility]

## 2017-11-14 ENCOUNTER — Ambulatory Visit (INDEPENDENT_AMBULATORY_CARE_PROVIDER_SITE_OTHER): Payer: Medicare Other | Admitting: Adult Health

## 2017-11-14 ENCOUNTER — Encounter: Payer: Self-pay | Admitting: Adult Health

## 2017-11-14 VITALS — BP 118/68 | HR 65 | Temp 97.9°F | Ht 62.0 in | Wt 139.0 lb

## 2017-11-14 DIAGNOSIS — I10 Essential (primary) hypertension: Secondary | ICD-10-CM | POA: Diagnosis not present

## 2017-11-14 DIAGNOSIS — R7303 Prediabetes: Secondary | ICD-10-CM

## 2017-11-14 DIAGNOSIS — I700 Atherosclerosis of aorta: Secondary | ICD-10-CM | POA: Diagnosis not present

## 2017-11-14 DIAGNOSIS — E559 Vitamin D deficiency, unspecified: Secondary | ICD-10-CM | POA: Diagnosis not present

## 2017-11-14 DIAGNOSIS — Z79899 Other long term (current) drug therapy: Secondary | ICD-10-CM

## 2017-11-14 DIAGNOSIS — E782 Mixed hyperlipidemia: Secondary | ICD-10-CM | POA: Diagnosis not present

## 2017-11-14 DIAGNOSIS — J439 Emphysema, unspecified: Secondary | ICD-10-CM

## 2017-11-14 NOTE — Patient Instructions (Signed)

## 2017-11-15 LAB — BASIC METABOLIC PANEL WITH GFR
BUN: 12 mg/dL (ref 7–25)
CHLORIDE: 100 mmol/L (ref 98–110)
CO2: 33 mmol/L — ABNORMAL HIGH (ref 20–32)
Calcium: 9.4 mg/dL (ref 8.6–10.4)
Creat: 0.68 mg/dL (ref 0.60–0.88)
GFR, Est African American: 94 mL/min/{1.73_m2} (ref >=60)
GFR, Est Non African American: 81 mL/min/{1.73_m2} (ref >=60)
GLUCOSE: 102 mg/dL — AB (ref 65–99)
POTASSIUM: 4.1 mmol/L (ref 3.5–5.3)
Sodium: 139 mmol/L (ref 135–146)

## 2017-11-15 LAB — CBC WITH DIFFERENTIAL/PLATELET
BASOS PCT: 0.4 %
Basophils Absolute: 28 cells/uL (ref 0–200)
Eosinophils Absolute: 117 cells/uL (ref 15–500)
Eosinophils Relative: 1.7 %
HCT: 38.5 % (ref 35.0–45.0)
Hemoglobin: 12.3 g/dL (ref 11.7–15.5)
Lymphs Abs: 1042 cells/uL (ref 850–3900)
MCH: 27.5 pg (ref 27.0–33.0)
MCHC: 31.9 g/dL — ABNORMAL LOW (ref 32.0–36.0)
MCV: 85.9 fL (ref 80.0–100.0)
MONOS PCT: 12.3 %
MPV: 11.2 fL (ref 7.5–12.5)
Neutro Abs: 4865 cells/uL (ref 1500–7800)
Neutrophils Relative %: 70.5 %
PLATELETS: 164 10*3/uL (ref 140–400)
RBC: 4.48 10*6/uL (ref 3.80–5.10)
RDW: 13 % (ref 11.0–15.0)
TOTAL LYMPHOCYTE: 15.1 %
WBC mixed population: 849 cells/uL (ref 200–950)
WBC: 6.9 10*3/uL (ref 3.8–10.8)

## 2017-11-15 LAB — HEPATIC FUNCTION PANEL
AG Ratio: 1.4 (calc) (ref 1.0–2.5)
ALKALINE PHOSPHATASE (APISO): 61 U/L (ref 33–130)
ALT: 10 U/L (ref 6–29)
AST: 18 U/L (ref 10–35)
Albumin: 3.9 g/dL (ref 3.6–5.1)
BILIRUBIN INDIRECT: 0.5 mg/dL (ref 0.2–1.2)
Bilirubin, Direct: 0.2 mg/dL (ref 0.0–0.2)
GLOBULIN: 2.8 g/dL (ref 1.9–3.7)
TOTAL PROTEIN: 6.7 g/dL (ref 6.1–8.1)
Total Bilirubin: 0.7 mg/dL (ref 0.2–1.2)

## 2017-11-15 LAB — TSH: TSH: 1.83 m[IU]/L (ref 0.40–4.50)

## 2017-11-15 LAB — LIPID PANEL
CHOL/HDL RATIO: 2.4 (calc) (ref <5.0)
Cholesterol: 176 mg/dL (ref <200)
HDL: 74 mg/dL (ref >50)
LDL CHOLESTEROL (CALC): 85 mg/dL
NON-HDL CHOLESTEROL (CALC): 102 mg/dL (ref <130)
TRIGLYCERIDES: 83 mg/dL (ref <150)

## 2017-11-15 LAB — HEMOGLOBIN A1C
Hgb A1c MFr Bld: 5.7 % of total Hgb — ABNORMAL HIGH (ref <5.7)
Mean Plasma Glucose: 117 (calc)
eAG (mmol/L): 6.5 (calc)

## 2018-01-08 ENCOUNTER — Other Ambulatory Visit: Payer: Self-pay | Admitting: Internal Medicine

## 2018-01-25 ENCOUNTER — Other Ambulatory Visit: Payer: Self-pay | Admitting: Physician Assistant

## 2018-01-25 DIAGNOSIS — Z1231 Encounter for screening mammogram for malignant neoplasm of breast: Secondary | ICD-10-CM

## 2018-02-13 DIAGNOSIS — L821 Other seborrheic keratosis: Secondary | ICD-10-CM | POA: Diagnosis not present

## 2018-02-13 DIAGNOSIS — Z85828 Personal history of other malignant neoplasm of skin: Secondary | ICD-10-CM | POA: Diagnosis not present

## 2018-02-13 DIAGNOSIS — D1801 Hemangioma of skin and subcutaneous tissue: Secondary | ICD-10-CM | POA: Diagnosis not present

## 2018-02-13 DIAGNOSIS — D229 Melanocytic nevi, unspecified: Secondary | ICD-10-CM | POA: Diagnosis not present

## 2018-02-13 DIAGNOSIS — L814 Other melanin hyperpigmentation: Secondary | ICD-10-CM | POA: Diagnosis not present

## 2018-02-20 ENCOUNTER — Ambulatory Visit (INDEPENDENT_AMBULATORY_CARE_PROVIDER_SITE_OTHER): Payer: Medicare Other | Admitting: Internal Medicine

## 2018-02-20 ENCOUNTER — Encounter: Payer: Self-pay | Admitting: Internal Medicine

## 2018-02-20 VITALS — BP 128/72 | HR 56 | Temp 97.5°F | Resp 16 | Ht 62.0 in | Wt 138.4 lb

## 2018-02-20 DIAGNOSIS — R7303 Prediabetes: Secondary | ICD-10-CM | POA: Diagnosis not present

## 2018-02-20 DIAGNOSIS — R7309 Other abnormal glucose: Secondary | ICD-10-CM

## 2018-02-20 DIAGNOSIS — Z79899 Other long term (current) drug therapy: Secondary | ICD-10-CM | POA: Diagnosis not present

## 2018-02-20 DIAGNOSIS — E782 Mixed hyperlipidemia: Secondary | ICD-10-CM

## 2018-02-20 DIAGNOSIS — E559 Vitamin D deficiency, unspecified: Secondary | ICD-10-CM | POA: Diagnosis not present

## 2018-02-20 DIAGNOSIS — I10 Essential (primary) hypertension: Secondary | ICD-10-CM | POA: Diagnosis not present

## 2018-02-20 NOTE — Progress Notes (Signed)
This very nice 84 y.o. WWF presents for 6 month follow up with HTN, HLD, Pre-Diabetes and Vitamin D Deficiency.  Patient has remote hx/o RULobectomy for Lung Ca in 1998. Patient is on Fosamax for hx/o Osteoporosis.     Patient is treated for HTN  (2000) & BP has been controlled at home. Today's BP is at goal - 128/72. Patient has had no complaints of any cardiac type chest pain, palpitations, dyspnea / orthopnea / PND, dizziness, claudication, or dependent edema.     Hyperlipidemia is controlled with diet & meds. Patient denies myalgias or other med SE's. Last Lipids were  Lab Results  Component Value Date   CHOL 166 02/20/2018   HDL 63 02/20/2018   LDLCALC 86 02/20/2018   TRIG 81 02/20/2018   CHOLHDL 2.6 02/20/2018      Also, the patient has history of PreDiabetes (A1c 5.9%/2009) and has had no symptoms of reactive hypoglycemia, diabetic polys, paresthesias or visual blurring.  Last A1c was near goal:  Lab Results  Component Value Date   HGBA1C 5.7 (H) 11/14/2017      Further, the patient also has history of Vitamin D Deficiency ("26"/2008)  and supplements vitamin D without any suspected side-effects. Last vitamin D was at goal: Lab Results  Component Value Date   VD25OH 83 07/27/2017   Current Outpatient Medications on File Prior to Visit  Medication Sig  . alendronate (FOSAMAX) 70 MG tablet TAKE 1 TABLET(70 MG) BY MOUTH 1 TIME A WEEK  . aspirin 81 MG chewable tablet Chew 81 mg by mouth daily.  . Cholecalciferol (VITAMIN D PO) Take 1-2 tablets by mouth See admin instructions. Vitamin d 5000 units per patient. Take twice every day except on Monday, Wednesday, and Fridays take 1 tablet  . diphenhydramine-acetaminophen (TYLENOL PM) 25-500 MG TABS Take 1 tablet by mouth at bedtime. Take every night per patient  . hydrochlorothiazide (HYDRODIURIL) 25 MG tablet TAKE 1 TABLET(25 MG) BY MOUTH DAILY  . metoprolol tartrate (LOPRESSOR) 50 MG tablet TAKE 1 TABLET BY MOUTH TWICE DAILY  .  simvastatin (ZOCOR) 40 MG tablet TAKE 1 TABLET BY MOUTH EVERY NIGHT AT BEDTIME FOR CHOLESTEROL   No current facility-administered medications on file prior to visit.    Allergies  Allergen Reactions  . Ace Inhibitors Cough  . Calcium-Containing Compounds Other (See Comments)    constpation  . Lactose Intolerance (Gi) Diarrhea  . Polysporin [Bacitracin-Polymyxin B] Rash    Polysporin or neosporin gave her a rash, couldn't remember which   PMHx:   Past Medical History:  Diagnosis Date  . COPD (chronic obstructive pulmonary disease) with emphysema ([place])   . Hyperlipidemia   . Hypertension   . Lung cancer, upper lobe ([place]) 12/04/1996   RUL  . Osteoporosis   . S/P partial lobectomy of lung    [id number]  . Thigh shingles 12/04/1996  . Thoracic aorta atherosclerosis ([place])   . Vitamin D deficiency    Immunization History  Administered Date(s) Administered  . Influenza, High Dose Seasonal PF 09/15/2015, 10/02/2017  . Influenza-Unspecified 08/04/2012, 09/03/2013  . Pneumococcal Conjugate-13 07/21/2014  . Pneumococcal Polysaccharide-23 10/30/2005  . Td 04/03/2013   Past Surgical History:  Procedure Laterality Date  . CARPAL TUNNEL RELEASE Right    2009  . LUNG REMOVAL, PARTIAL Right    RUL 1998  . OLECRANON BURSECTOMY Left 03/20/2017   Procedure: EXICISION OLECRANON BURSA LEFT ELBOW;  Surgeon: Daryll Brod, MD;  Location: [facility];  Service: Orthopedics;  Laterality: Left;  with scb block in preop  . SHOULDER ADHESION RELEASE Right    2004   FHx:    Reviewed / unchanged  SHx:    Reviewed / unchanged  Systems Review:  Constitutional: Denies fever, chills, wt changes, headaches, insomnia, fatigue, night sweats, change in appetite. Eyes: Denies redness, blurred vision, diplopia, discharge, itchy, watery eyes.  ENT: Denies discharge, congestion, post nasal drip, epistaxis, sore throat, earache, hearing loss, dental pain, tinnitus, vertigo, sinus pain, snoring.  CV:  Denies chest pain, palpitations, irregular heartbeat, syncope, dyspnea, diaphoresis, orthopnea, PND, claudication or edema. Respiratory: denies cough, dyspnea, DOE, pleurisy, hoarseness, laryngitis, wheezing.  Gastrointestinal: Denies dysphagia, odynophagia, heartburn, reflux, water brash, abdominal pain or cramps, nausea, vomiting, bloating, diarrhea, constipation, hematemesis, melena, hematochezia  or hemorrhoids. Genitourinary: Denies dysuria, frequency, urgency, nocturia, hesitancy, discharge, hematuria or flank pain. Musculoskeletal: Denies arthralgias, myalgias, stiffness, jt. swelling, pain, limping or strain/sprain.  Skin: Denies pruritus, rash, hives, warts, acne, eczema or change in skin lesion(s). Neuro: No weakness, tremor, incoordination, spasms, paresthesia or pain. Psychiatric: Denies confusion, memory loss or sensory loss. Endo: Denies change in weight, skin or hair change.  Heme/Lymph: No excessive bleeding, bruising or enlarged lymph nodes.  Physical Exam  BP 128/72   Pulse (!) 56   Temp (!) 97.5 F (36.4 C)   Resp 16   Ht 5\' 2"  (1.575 m)   Wt 138 lb 6.4 oz (62.8 kg)   BMI 25.31 kg/m   Appears  well nourished, well groomed  and in no distress.  Eyes: PERRLA, EOMs, conjunctiva no swelling or erythema. Sinuses: No frontal/maxillary tenderness ENT/Mouth: EAC's clear, TM's nl w/o erythema, bulging. Nares clear w/o erythema, swelling, exudates. Oropharynx clear without erythema or exudates. Oral hygiene is good. Tongue normal, non obstructing. Hearing intact.  Neck: Supple. Thyroid not palpable. Car 2+/2+ without bruits, nodes or JVD. Chest: Respirations nl with BS clear & equal w/o rales, rhonchi, wheezing or stridor.  Cor: Heart sounds normal w/ regular rate and rhythm without sig. murmurs, gallops, clicks or rubs. Peripheral pulses normal and equal  without edema.  Abdomen: Soft & bowel sounds normal. Non-tender w/o guarding, rebound, hernias, masses or organomegaly.    Lymphatics: Unremarkable.  Musculoskeletal: Full ROM all peripheral extremities, joint stability, 5/5 strength and normal gait.  Skin: Warm, dry without exposed rashes, lesions or ecchymosis apparent.  Neuro: Cranial nerves intact, reflexes equal bilaterally. Sensory-motor testing grossly intact. Tendon reflexes grossly intact.  Pysch: Alert & oriented x 3.  Insight and judgement nl & appropriate. No ideations.  Assessment and Plan:  1. Essential hypertension  - Continue medication, monitor blood pressure at home.  - Continue DASH diet. Reminder to go to the ER if any CP,  SOB, nausea, dizziness, severe HA, changes vision/speech.  - CBC with Differential/Platelet - BASIC METABOLIC PANEL WITH GFR - Magnesium - TSH  2. Hyperlipidemia, mixed  - Continue diet/meds, exercise,& lifestyle modifications.  - Continue monitor periodic cholesterol/liver & renal functions   - Hepatic function panel - Lipid panel  3. Abnormal glucose  - Continue diet, exercise, lifestyle modifications.  - Monitor appropriate labs.  - Hemoglobin A1c - Insulin, random  4. Vitamin D deficiency  - Continue supplementation.  - VITAMIN D 25 Hydroxyl  5. Prediabetes  - Hemoglobin A1c - Insulin, random  6. Medication management  - CBC with Differential/Platelet - BASIC METABOLIC PANEL WITH GFR - Hepatic function panel - Magnesium - Lipid panel - TSH - Hemoglobin A1c -  Insulin, random - VITAMIN D 25 Hydroxyl         Discussed  regular exercise, BP monitoring, weight control to achieve/maintain BMI less than 25 and discussed med and SE's. Recommended labs to assess and monitor clinical status with further disposition pending results of labs. Over 30 minutes of exam, counseling, chart review was performed.

## 2018-02-20 NOTE — Patient Instructions (Signed)

## 2018-02-21 LAB — TSH: TSH: 1.34 mIU/L (ref 0.40–4.50)

## 2018-02-21 LAB — CBC WITH DIFFERENTIAL/PLATELET
BASOS ABS: 22 {cells}/uL (ref 0–200)
Basophils Relative: 0.5 %
EOS ABS: 132 {cells}/uL (ref 15–500)
EOS PCT: 3 %
HCT: 42.8 % (ref 35.0–45.0)
Hemoglobin: 13.6 g/dL (ref 11.7–15.5)
Lymphs Abs: 1681 cells/uL (ref 850–3900)
MCH: 27.6 pg (ref 27.0–33.0)
MCHC: 31.8 g/dL — AB (ref 32.0–36.0)
MCV: 86.8 fL (ref 80.0–100.0)
MONOS PCT: 10.3 %
MPV: 11.1 fL (ref 7.5–12.5)
Neutro Abs: 2112 cells/uL (ref 1500–7800)
Neutrophils Relative %: 48 %
PLATELETS: 177 10*3/uL (ref 140–400)
RBC: 4.93 10*6/uL (ref 3.80–5.10)
RDW: 12.5 % (ref 11.0–15.0)
TOTAL LYMPHOCYTE: 38.2 %
WBC mixed population: 453 cells/uL (ref 200–950)
WBC: 4.4 10*3/uL (ref 3.8–10.8)

## 2018-02-21 LAB — HEMOGLOBIN A1C
HEMOGLOBIN A1C: 5.8 %{Hb} — AB (ref <5.7)
MEAN PLASMA GLUCOSE: 120 (calc)
eAG (mmol/L): 6.6 (calc)

## 2018-02-21 LAB — BASIC METABOLIC PANEL WITH GFR
BUN: 20 mg/dL (ref 7–25)
CHLORIDE: 101 mmol/L (ref 98–110)
CO2: 35 mmol/L — ABNORMAL HIGH (ref 20–32)
Calcium: 9.7 mg/dL (ref 8.6–10.4)
Creat: 0.77 mg/dL (ref 0.60–0.88)
GFR, Est African American: 83 mL/min/{1.73_m2} (ref >=60)
GFR, Est Non African American: 72 mL/min/{1.73_m2} (ref >=60)
GLUCOSE: 106 mg/dL — AB (ref 65–99)
Potassium: 4.1 mmol/L (ref 3.5–5.3)
SODIUM: 140 mmol/L (ref 135–146)

## 2018-02-21 LAB — LIPID PANEL
CHOL/HDL RATIO: 2.6 (calc) (ref <5.0)
CHOLESTEROL: 166 mg/dL (ref <200)
HDL: 63 mg/dL (ref >50)
LDL Cholesterol (Calc): 86 mg/dL (calc)
Non-HDL Cholesterol (Calc): 103 mg/dL (calc) (ref <130)
Triglycerides: 81 mg/dL (ref <150)

## 2018-02-21 LAB — HEPATIC FUNCTION PANEL
AG RATIO: 1.6 (calc) (ref 1.0–2.5)
ALKALINE PHOSPHATASE (APISO): 64 U/L (ref 33–130)
ALT: 14 U/L (ref 6–29)
AST: 23 U/L (ref 10–35)
Albumin: 4.4 g/dL (ref 3.6–5.1)
BILIRUBIN INDIRECT: 0.4 mg/dL (ref 0.2–1.2)
Bilirubin, Direct: 0.1 mg/dL (ref 0.0–0.2)
Globulin: 2.7 g/dL (calc) (ref 1.9–3.7)
TOTAL PROTEIN: 7.1 g/dL (ref 6.1–8.1)
Total Bilirubin: 0.5 mg/dL (ref 0.2–1.2)

## 2018-02-21 LAB — INSULIN, RANDOM: Insulin: 2.1 u[IU]/mL (ref 2.0–19.6)

## 2018-02-21 LAB — MAGNESIUM: MAGNESIUM: 1.5 mg/dL (ref 1.5–2.5)

## 2018-02-21 LAB — VITAMIN D 25 HYDROXY (VIT D DEFICIENCY, FRACTURES): Vit D, 25-Hydroxy: 82 ng/mL (ref 30–100)

## 2018-03-06 ENCOUNTER — Ambulatory Visit
Admission: RE | Admit: 2018-03-06 | Discharge: 2018-03-06 | Disposition: A | Discharge status: Home / Self Care | Payer: Medicare Other | Source: Ambulatory Visit | Attending: Physician Assistant | Admitting: Physician Assistant

## 2018-03-06 DIAGNOSIS — Z1231 Encounter for screening mammogram for malignant neoplasm of breast: Secondary | ICD-10-CM

## 2018-03-27 ENCOUNTER — Other Ambulatory Visit: Payer: Self-pay | Admitting: Internal Medicine

## 2018-04-04 ENCOUNTER — Other Ambulatory Visit: Payer: Self-pay | Admitting: Internal Medicine

## 2018-04-24 DIAGNOSIS — Z961 Presence of intraocular lens: Secondary | ICD-10-CM | POA: Diagnosis not present

## 2018-05-26 ENCOUNTER — Other Ambulatory Visit: Payer: Self-pay | Admitting: Internal Medicine

## 2018-06-20 ENCOUNTER — Other Ambulatory Visit: Payer: Self-pay | Admitting: Internal Medicine

## 2018-06-23 ENCOUNTER — Other Ambulatory Visit: Payer: Self-pay | Admitting: Internal Medicine

## 2018-07-02 NOTE — Progress Notes (Signed)
MEDICARE ANNUAL WELLNESS VISIT AND FOLLOW UP  Assessment:   Encounter for annual medicare wellness exam  Essential hypertension - continue medications, DASH diet, exercise and monitor at home. Call if greater than 130/80.  - can cut HCTZ in half - CBC with Differential/Platelet - BASIC METABOLIC PANEL WITH GFR - Hepatic function panel - TSH   Hyperlipidemia -continue medications, check lipids, decrease fatty foods, increase activity.  - Lipid panel   Prediabetes Discussed general issues about diabetes pathophysiology and management., Educational material distributed., Suggested low cholesterol diet., Encouraged aerobic exercise., Discussed foot care., Reminded to get yearly retinal exam. - Hemoglobin A1c   Vitamin D deficiency - Vit D  25 hydroxy (rtn osteoporosis monitoring)   Medication management - Magnesium  COPD monitor   Atherosclerosis of aorta Control blood pressure, cholesterol, glucose, increase exercise.   Hx of cancer of lung CXR 06/2015 normal, continue follow up   Osteoporosis Continue vitamin D, Ca, stop alendronate until next year - has been on for 3 years  Environmental allergies Allergic rhinitis- Allegra OTC, increase H20, allergy hygiene explained.  Future Appointments  Date Time Provider [place]  10/09/2018 11:00 AM Unk Pinto, MD [facility] None     Plan:   During the course of the visit the patient was educated and counseled about appropriate screening and preventive services including:    Pneumococcal vaccine   Influenza vaccine  Td vaccine  Screening electrocardiogram  Screening mammography  Bone densitometry screening  Colorectal cancer screening  Diabetes screening  Glaucoma screening  Nutrition counseling   Advanced directives: given info/requested  Subjective:   Penny [name] is a 84 y.o. female who presents for Medicare Annual Wellness Visit and 3 month follow up on hypertension, prediabetes,  hyperlipidemia, vitamin D def.   BMI is Body mass index is 25.97 kg/m., she has been working on diet and exercise, working part time at [employer] 3 days a week Wt Readings from Last 3 Encounters:  07/03/18 142 lb (64.4 kg)  02/20/18 138 lb 6.4 oz (62.8 kg)  11/14/17 139 lb (63 kg)   Her blood pressure has been controlled at home, today their BP is BP: 138/78 She does not workout but is very active and does try to walk, works 3 days a week, Youth worker. She denies chest pain, shortness of breath, dizziness.   She is on cholesterol medication, pravastatin 40 and denies myalgias. Her cholesterol is at goal. The cholesterol last visit was:   Lab Results  Component Value Date   CHOL 166 02/20/2018   HDL 63 02/20/2018   LDLCALC 86 02/20/2018   TRIG 81 02/20/2018   CHOLHDL 2.6 02/20/2018   She has been working on diet and exercise for prediabetes, and denies paresthesia of the feet, polydipsia and polyuria. Last A1C in the office was:  Lab Results  Component Value Date   HGBA1C 5.8 (H) 02/20/2018   Patient is on Vitamin D supplement. Lab Results  Component Value Date   VD25OH 82 02/20/2018        Medication Review Current Outpatient Medications on File Prior to Visit  Medication Sig Dispense Refill  . alendronate (FOSAMAX) 70 MG tablet TAKE 1 TABLET(70 MG) BY MOUTH 1 TIME A WEEK 12 tablet 0  . aspirin 81 MG chewable tablet Chew 81 mg by mouth daily.    . Cholecalciferol (VITAMIN D PO) Take 1-2 tablets by mouth See admin instructions. Vitamin d 5000 units per patient. Take twice every day except on  Monday, Wednesday, and Fridays take 1 tablet    . diphenhydramine-acetaminophen (TYLENOL PM) 25-500 MG TABS Take 1 tablet by mouth at bedtime. Take every night per patient    . hydrochlorothiazide (HYDRODIURIL) 25 MG tablet TAKE 1 TABLET(25 MG) BY MOUTH DAILY 90 tablet 0  . metoprolol tartrate (LOPRESSOR) 50 MG tablet TAKE 1 TABLET BY MOUTH TWICE DAILY 180 tablet 0   . simvastatin (ZOCOR) 40 MG tablet TAKE 1 TABLET BY MOUTH EVERY NIGHT AT BEDTIME FOR CHOLESTEROL 90 tablet 1   No current facility-administered medications on file prior to visit.     Current Problems (verified) Patient Active Problem List   Diagnosis Date Noted  . Osteoporosis 12/28/2015  . COPD (chronic obstructive pulmonary disease) with emphysema ([place]) 02/25/2015  . Atherosclerosis of aorta ([place]) 02/25/2015  . Prediabetes 04/15/2014  . Medication management 04/15/2014  . Hypertension   . Hyperlipidemia   . Vitamin D deficiency   . Personal history of lung cancer 12/04/1996    Immunization History  Administered Date(s) Administered  . Influenza, High Dose Seasonal PF 09/15/2015, 10/02/2017  . Influenza-Unspecified 08/04/2012, 09/03/2013  . Pneumococcal Conjugate-13 07/21/2014  . Pneumococcal Polysaccharide-23 10/30/2005  . Td 04/03/2013   Preventative care: Last colonoscopy: 2012 done  Last mammogram: 03/2018 Last pap smear/pelvic exam: 2012 negative DEXA: 02/2017, has been on alendronate since 2016, will stop until next year Echo 2009 normal EF CXR 07/2016 CT cervical spin 10/2014 CT head 10/20/2014  Prior vaccinations: TD or Tdap: 2014  Influenza: 2016 Pneumococcal: 2006 Prevnar 13: 2015 Shingles/Zostavax: declines due to cost  Names of Other Physician/Practitioners you currently use: 1. [facility]- here for primary care 2. Dr. Prudencio Burly, eye doctor, last visit 2019 3. , dentist, last visit 07/2015 4. Dr. Allyson Sabal, derm, 2019, goes annually  Patient Care Team: Unk Pinto, MD as PCP - General (Internal Medicine) Druscilla Brownie, MD as Consulting Physician (Dermatology) Daryll Brod, MD as Consulting Physician (Orthopedic Surgery) Richmond Campbell, MD as Consulting Physician (Gastroenterology) Nicanor Alcon, MD as Attending Physician (Thoracic Surgery)  Allergies Allergies  Allergen Reactions  . Ace Inhibitors  Cough  . Calcium-Containing Compounds Other (See Comments)    constpation  . Lactose Intolerance (Gi) Diarrhea  . Polysporin [Bacitracin-Polymyxin B] Rash    Polysporin or neosporin gave her a rash, couldn't remember which    SURGICAL HISTORY She  has a past surgical history that includes Carpal tunnel release (Right); Shoulder adhesion release (Right); Lung removal, partial (Right); and Olecranon bursectomy (Left, 03/20/2017). FAMILY HISTORY Her family history includes Breast cancer in her paternal aunt; Heart disease in her mother; Lung cancer in her father. SOCIAL HISTORY She  reports that she quit smoking about 21 years ago. She has never used smokeless tobacco. She reports that she drinks about 0.6 oz of alcohol per week. She reports that she does not use drugs.  MEDICARE WELLNESS OBJECTIVES: Physical activity: Current Exercise Habits: The patient has a physically strenous job, but has no regular exercise apart from work.(works on her feet, lots of walking and lifting 3 days a week), Exercise limited by: None identified Cardiac risk factors: Cardiac Risk Factors include: advanced age (>80men, >73 women);dyslipidemia;hypertension;smoking/ tobacco exposure Depression/mood screen:   Depression screen [facility] 2/9 07/03/2018  Decreased Interest 0  Down, Depressed, Hopeless 0  PHQ - 2 Score 0    ADLs:  In your present state of health, do you have any difficulty performing the following activities: 07/03/2018 02/20/2018  Hearing? N N  Vision?  N N  Difficulty concentrating or making decisions? N N  Walking or climbing stairs? N N  Dressing or bathing? N N  Doing errands, shopping? N N  Some recent data might be hidden     Cognitive Testing  Alert? Yes  Normal Appearance?Yes  Oriented to person? Yes  Place? Yes   Time? Yes  Recall of three objects?  Yes  Can perform simple calculations? Yes  Displays appropriate judgment?Yes  Can read the correct time from a watch face?Yes  EOL  planning: Does Patient Have a Medical Advance Directive?: Yes Type of Advance Directive: Healthcare Power of Attorney, Living will Does patient want to make changes to medical advance directive?: No - Patient declined Copy of [place] in Chart?: No - copy requested   Objective:   Blood pressure 138/78, pulse 96, temperature (!) 97.3 F (36.3 C), height 5\' 2"  (1.575 m), weight 142 lb (64.4 kg), SpO2 90 %. Body mass index is 25.97 kg/m.  General appearance: alert, no distress, WD/WN,  female HEENT: normocephalic, sclerae anicteric, TMs pearly, nares patent, no discharge or erythema, pharynx normal Oral cavity: MMM, no lesions Neck: supple, no lymphadenopathy, no thyromegaly, no masses Heart: RRR, normal S1, S2, no murmurs Lungs: CTA bilaterally, no wheezes, rhonchi, or rales Abdomen: +bs, soft, non tender, non distended, no masses, no hepatomegaly, no splenomegaly Musculoskeletal:  no swelling, no obvious deformity Extremities: no edema, no cyanosis, no clubbing Pulses: 2+ symmetric, upper and lower extremities, normal cap refill Neurological: alert, oriented x 3, CN2-12 intact, strength normal upper extremities and lower extremities, sensation normal throughout, DTRs 2+ throughout, no cerebellar signs, gait normal Psychiatric: normal affect, behavior normal, pleasant  Breast: defer Gyn: defer Rectal: defer  Medicare Attestation I have personally reviewed: The patient's medical and social history Their use of alcohol, tobacco or illicit drugs Their current medications and supplements The patient's functional ability including ADLs,fall risks, home safety risks, cognitive, and hearing and visual impairment Diet and physical activities Evidence for depression or mood disorders  The patient's weight, height, BMI, and visual acuity have been recorded in the chart.  I have made referrals, counseling, and provided education to the patient based on review of the above  and I have provided the patient with a written personalized care plan for preventive services.     Izora Ribas, NP   07/03/2018

## 2018-07-03 ENCOUNTER — Encounter: Payer: Self-pay | Admitting: Adult Health

## 2018-07-03 ENCOUNTER — Ambulatory Visit (INDEPENDENT_AMBULATORY_CARE_PROVIDER_SITE_OTHER): Payer: Medicare Other | Admitting: Adult Health

## 2018-07-03 VITALS — BP 138/78 | HR 96 | Temp 97.3°F | Ht 62.0 in | Wt 142.0 lb

## 2018-07-03 DIAGNOSIS — J439 Emphysema, unspecified: Secondary | ICD-10-CM

## 2018-07-03 DIAGNOSIS — E559 Vitamin D deficiency, unspecified: Secondary | ICD-10-CM | POA: Diagnosis not present

## 2018-07-03 DIAGNOSIS — R7303 Prediabetes: Secondary | ICD-10-CM | POA: Diagnosis not present

## 2018-07-03 DIAGNOSIS — E782 Mixed hyperlipidemia: Secondary | ICD-10-CM

## 2018-07-03 DIAGNOSIS — M810 Age-related osteoporosis without current pathological fracture: Secondary | ICD-10-CM | POA: Diagnosis not present

## 2018-07-03 DIAGNOSIS — Z79899 Other long term (current) drug therapy: Secondary | ICD-10-CM | POA: Diagnosis not present

## 2018-07-03 DIAGNOSIS — I700 Atherosclerosis of aorta: Secondary | ICD-10-CM

## 2018-07-03 DIAGNOSIS — Z0001 Encounter for general adult medical examination with abnormal findings: Secondary | ICD-10-CM | POA: Diagnosis not present

## 2018-07-03 DIAGNOSIS — I10 Essential (primary) hypertension: Secondary | ICD-10-CM | POA: Diagnosis not present

## 2018-07-03 DIAGNOSIS — Z0000 Encounter for general adult medical examination without abnormal findings: Secondary | ICD-10-CM

## 2018-07-03 DIAGNOSIS — Z6825 Body mass index (BMI) 25.0-25.9, adult: Secondary | ICD-10-CM

## 2018-07-03 DIAGNOSIS — Z85118 Personal history of other malignant neoplasm of bronchus and lung: Secondary | ICD-10-CM

## 2018-07-03 DIAGNOSIS — R6889 Other general symptoms and signs: Secondary | ICD-10-CM | POA: Diagnosis not present

## 2018-07-03 NOTE — Patient Instructions (Addendum)
Bring by advanced directive/ living will  Stop alendronate after this script  Aim for 7+ servings of fruits and vegetables daily  65-80+ fluid ounces of water or unsweet tea for healthy kidneys  Limit animal fats in diet for cholesterol and heart health - choose grass fed whenever available  Aim for low stress - take time to unwind and care for your mental health  Aim for 150 min of moderate intensity exercise weekly for heart health, and weights twice weekly for bone health  Aim for 7-9 hours of sleep daily      When it comes to diets, agreement about the perfect plan isn't easy to find, even among the experts. Experts at the [employer] developed an idea known as the Healthy Eating Plate. Just imagine a plate divided into logical, healthy portions.  The emphasis is on diet quality:  Load up on vegetables and fruits - one-half of your plate: Aim for color and variety, and remember that potatoes don't count.  Go for whole grains - one-quarter of your plate: Whole wheat, barley, wheat berries, quinoa, oats, brown rice, and foods made with them. If you want pasta, go with whole wheat pasta.  Protein power - one-quarter of your plate: Fish, chicken, beans, and nuts are all healthy, versatile protein sources. Limit red meat.  The diet, however, does go beyond the plate, offering a few other suggestions.  Use healthy plant oils, such as olive, canola, soy, corn, sunflower and peanut. Check the labels, and avoid partially hydrogenated oil, which have unhealthy trans fats.  If you're thirsty, drink water. Coffee and tea are good in moderation, but skip sugary drinks and limit milk and dairy products to one or two daily servings.  The type of carbohydrate in the diet is more important than the amount. Some sources of carbohydrates, such as vegetables, fruits, whole grains, and beans-are healthier than others.  Finally, stay active.

## 2018-07-04 LAB — LIPID PANEL
CHOL/HDL RATIO: 2.4 (calc) (ref <5.0)
CHOLESTEROL: 174 mg/dL (ref <200)
HDL: 74 mg/dL (ref >50)
LDL CHOLESTEROL (CALC): 84 mg/dL
NON-HDL CHOLESTEROL (CALC): 100 mg/dL (ref <130)
Triglycerides: 71 mg/dL (ref <150)

## 2018-07-04 LAB — CBC WITH DIFFERENTIAL/PLATELET
BASOS ABS: 28 {cells}/uL (ref 0–200)
Basophils Relative: 0.5 %
EOS PCT: 2.7 %
Eosinophils Absolute: 151 cells/uL (ref 15–500)
HCT: 41.3 % (ref 35.0–45.0)
HEMOGLOBIN: 13.2 g/dL (ref 11.7–15.5)
Lymphs Abs: 1355 cells/uL (ref 850–3900)
MCH: 28.1 pg (ref 27.0–33.0)
MCHC: 32 g/dL (ref 32.0–36.0)
MCV: 87.9 fL (ref 80.0–100.0)
MONOS PCT: 10.5 %
MPV: 10.7 fL (ref 7.5–12.5)
Neutro Abs: 3478 cells/uL (ref 1500–7800)
Neutrophils Relative %: 62.1 %
Platelets: 180 10*3/uL (ref 140–400)
RBC: 4.7 10*6/uL (ref 3.80–5.10)
RDW: 12.3 % (ref 11.0–15.0)
Total Lymphocyte: 24.2 %
WBC mixed population: 588 cells/uL (ref 200–950)
WBC: 5.6 10*3/uL (ref 3.8–10.8)

## 2018-07-04 LAB — COMPLETE METABOLIC PANEL WITH GFR
AG Ratio: 1.7 (calc) (ref 1.0–2.5)
ALT: 14 U/L (ref 6–29)
AST: 22 U/L (ref 10–35)
Albumin: 4.4 g/dL (ref 3.6–5.1)
Alkaline phosphatase (APISO): 83 U/L (ref 33–130)
BUN: 24 mg/dL (ref 7–25)
CALCIUM: 9.6 mg/dL (ref 8.6–10.4)
CO2: 28 mmol/L (ref 20–32)
CREATININE: 0.75 mg/dL (ref 0.60–0.88)
Chloride: 104 mmol/L (ref 98–110)
GFR, EST AFRICAN AMERICAN: 85 mL/min/{1.73_m2} (ref >=60)
GFR, EST NON AFRICAN AMERICAN: 74 mL/min/{1.73_m2} (ref >=60)
GLUCOSE: 90 mg/dL (ref 65–99)
Globulin: 2.6 g/dL (calc) (ref 1.9–3.7)
Potassium: 4 mmol/L (ref 3.5–5.3)
Sodium: 145 mmol/L (ref 135–146)
TOTAL PROTEIN: 7 g/dL (ref 6.1–8.1)
Total Bilirubin: 0.4 mg/dL (ref 0.2–1.2)

## 2018-07-04 LAB — TSH: TSH: 1.01 m[IU]/L (ref 0.40–4.50)

## 2018-07-04 LAB — HEMOGLOBIN A1C
Hgb A1c MFr Bld: 5.9 % of total Hgb — ABNORMAL HIGH (ref <5.7)
MEAN PLASMA GLUCOSE: 123 (calc)
eAG (mmol/L): 6.8 (calc)

## 2018-08-20 ENCOUNTER — Encounter: Payer: Self-pay | Admitting: Internal Medicine

## 2018-09-18 ENCOUNTER — Other Ambulatory Visit: Payer: Self-pay | Admitting: Internal Medicine

## 2018-10-09 ENCOUNTER — Ambulatory Visit (HOSPITAL_COMMUNITY)
Admission: RE | Admit: 2018-10-09 | Discharge: 2018-10-09 | Disposition: A | Discharge status: Home / Self Care | Payer: Medicare Other | Source: Ambulatory Visit | Attending: Internal Medicine | Admitting: Internal Medicine

## 2018-10-09 ENCOUNTER — Ambulatory Visit (INDEPENDENT_AMBULATORY_CARE_PROVIDER_SITE_OTHER): Payer: Medicare Other | Admitting: Internal Medicine

## 2018-10-09 ENCOUNTER — Encounter: Payer: Self-pay | Admitting: Internal Medicine

## 2018-10-09 VITALS — BP 114/76 | HR 68 | Temp 97.6°F | Resp 16 | Ht 62.5 in | Wt 143.6 lb

## 2018-10-09 DIAGNOSIS — R7303 Prediabetes: Secondary | ICD-10-CM

## 2018-10-09 DIAGNOSIS — E782 Mixed hyperlipidemia: Secondary | ICD-10-CM | POA: Diagnosis not present

## 2018-10-09 DIAGNOSIS — C3410 Malignant neoplasm of upper lobe, unspecified bronchus or lung: Secondary | ICD-10-CM | POA: Insufficient documentation

## 2018-10-09 DIAGNOSIS — Z0001 Encounter for general adult medical examination with abnormal findings: Secondary | ICD-10-CM

## 2018-10-09 DIAGNOSIS — Z0000 Encounter for general adult medical examination without abnormal findings: Secondary | ICD-10-CM

## 2018-10-09 DIAGNOSIS — Z1211 Encounter for screening for malignant neoplasm of colon: Secondary | ICD-10-CM

## 2018-10-09 DIAGNOSIS — E559 Vitamin D deficiency, unspecified: Secondary | ICD-10-CM | POA: Diagnosis not present

## 2018-10-09 DIAGNOSIS — R079 Chest pain, unspecified: Secondary | ICD-10-CM | POA: Diagnosis not present

## 2018-10-09 DIAGNOSIS — M810 Age-related osteoporosis without current pathological fracture: Secondary | ICD-10-CM

## 2018-10-09 DIAGNOSIS — I10 Essential (primary) hypertension: Secondary | ICD-10-CM

## 2018-10-09 DIAGNOSIS — Z1212 Encounter for screening for malignant neoplasm of rectum: Secondary | ICD-10-CM

## 2018-10-09 DIAGNOSIS — Z136 Encounter for screening for cardiovascular disorders: Secondary | ICD-10-CM

## 2018-10-09 DIAGNOSIS — R7309 Other abnormal glucose: Secondary | ICD-10-CM | POA: Diagnosis not present

## 2018-10-09 DIAGNOSIS — J449 Chronic obstructive pulmonary disease, unspecified: Secondary | ICD-10-CM | POA: Insufficient documentation

## 2018-10-09 DIAGNOSIS — Z79899 Other long term (current) drug therapy: Secondary | ICD-10-CM

## 2018-10-09 DIAGNOSIS — Z8249 Family history of ischemic heart disease and other diseases of the circulatory system: Secondary | ICD-10-CM

## 2018-10-09 DIAGNOSIS — I700 Atherosclerosis of aorta: Secondary | ICD-10-CM

## 2018-10-09 DIAGNOSIS — Z87891 Personal history of nicotine dependence: Secondary | ICD-10-CM

## 2018-10-09 NOTE — Progress Notes (Signed)
[name] Unk Pinto, M.D.     Uvaldo Bristle. Silverio Lay, P.A.-C Liane Comber, [place] [address] [place] [address] Telephone [phone] Telefax [phone] Annual Screening/Preventative Visit & Comprehensive Evaluation &  Examination     This very nice 84 y.o. WWF presents for a Screening /Preventative Visit & comprehensive evaluation and management of multiple medical co-morbidities.  Patient has been followed for HTN, HLD, Prediabetes  and Vitamin D Deficiency.   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        HTN predates circa 2000. Patient's BP has been controlled at home and patient denies any cardiac symptoms as chest pain, palpitations, shortness of breath, dizziness or ankle swelling. Today's BP is at goal: 114/76.      Patient's hyperlipidemia is controlled with diet and medications. Patient denies myalgias or other medication SE's. Last lipids were at goal: Lab Results  Component Value Date   CHOL 174 07/03/2018   HDL 74 07/03/2018   LDLCALC 84 07/03/2018   TRIG 71 07/03/2018   CHOLHDL 2.4 07/03/2018      Patient has hx/o prediabetes (A1c 5.9% / 2009)  and patient denies reactive hypoglycemic symptoms, visual blurring, diabetic polys or paresthesias. Last A1c was not at goal: Lab Results  Component Value Date   HGBA1C 5.9 (H) 07/03/2018      Patient is on Fosamax (Mar 2016) for hx/o Osteoporosis. Last dexaBMD on 02/28/2017 was improved to Osteopenia.      Finally, patient has history of Vitamin D Deficiency ("26" / 2008)  and last Vitamin D was at goal: Lab Results  Component Value Date   VD25OH 82 02/20/2018   Current Outpatient Medications on File Prior to Visit  Medication Sig  . aspirin 81 MG chewable tablet Chew 81 mg by mouth daily.  . Cholecalciferol (VITAMIN D PO) Take 1-2 tablets by mouth See admin instructions. Vitamin d 5000 units per  patient. Take twice every day except on Monday, Wednesday, and Fridays take 1 tablet  . diphenhydramine-acetaminophen (TYLENOL PM) 25-500 MG TABS Take 1 tablet by mouth at bedtime. Take every night per patient  . hydrochlorothiazide (HYDRODIURIL) 25 MG tablet TAKE 1 TABLET(25 MG) BY MOUTH DAILY  . metoprolol tartrate (LOPRESSOR) 50 MG tablet TAKE 1 TABLET BY MOUTH TWICE DAILY  . simvastatin (ZOCOR) 40 MG tablet TAKE 1 TABLET BY MOUTH EVERY NIGHT AT BEDTIME FOR CHOLESTEROL   No current facility-administered medications on file prior to visit.    Allergies  Allergen Reactions  . Ace Inhibitors Cough  . Calcium-Containing Compounds Other (See Comments)    constpation  . Lactose Intolerance (Gi) Diarrhea  . Polysporin [Bacitracin-Polymyxin B] Rash    Polysporin or neosporin gave her a rash, couldn't remember which   Past Medical History:  Diagnosis Date  . COPD (chronic obstructive pulmonary disease) with emphysema ([place])   . Hyperlipidemia   . Hypertension   . Lung cancer, upper lobe ([place]) 12/04/1996   RUL  . Osteoporosis   . S/P partial lobectomy of lung    [id number]  . Thigh shingles 12/04/1996  . Thoracic aorta atherosclerosis ([place])   . Vitamin D deficiency    Health Maintenance  Topic Date Due  . INFLUENZA VACCINE  07/04/2018  . TETANUS/TDAP  04/04/2023  . DEXA SCAN  Completed  . PNA vac Low Risk Adult  Completed   Immunization History  Administered Date(s) Administered  . Influenza, High Dose  Seasonal PF 09/15/2015, 10/02/2017  . Influenza-Unspecified 08/04/2012, 09/03/2013, 09/03/2018  . Pneumococcal Conjugate-13 07/21/2014  . Pneumococcal Polysaccharide-23 10/30/2005  . Td 04/03/2013   Last Colon -   Last MGM -   Past Surgical History:  Procedure Laterality Date  . CARPAL TUNNEL RELEASE Right    2009  . LUNG REMOVAL, PARTIAL Right    RUL 1998  . OLECRANON BURSECTOMY Left 03/20/2017   Procedure: EXICISION OLECRANON BURSA LEFT ELBOW;  Surgeon: Daryll Brod, MD;   Location: [facility];  Service: Orthopedics;  Laterality: Left;  with scb block in preop  . SHOULDER ADHESION RELEASE Right    2004   Family History  Problem Relation Age of Onset  . Heart disease Mother   . Lung cancer Father   . Breast cancer Paternal Aunt    Social History   Tobacco Use  . Smoking status: Former Smoker    Last attempt to quit: 12/04/1996    Years since quitting: 21.8  . Smokeless tobacco: Never Used  Substance Use Topics  . Alcohol use: Yes    Alcohol/week: 1.0 standard drinks    Types: 1 Glasses of wine per week    Comment: social  . Drug use: No    ROS Constitutional: Denies fever, chills, weight loss/gain, headaches, insomnia,  night sweats, and change in appetite. Does c/o fatigue. Eyes: Denies redness, blurred vision, diplopia, discharge, itchy, watery eyes.  ENT: Denies discharge, congestion, post nasal drip, epistaxis, sore throat, earache, hearing loss, dental pain, Tinnitus, Vertigo, Sinus pain, snoring.  Cardio: Denies chest pain, palpitations, irregular heartbeat, syncope, dyspnea, diaphoresis, orthopnea, PND, claudication, edema Respiratory: denies cough, dyspnea, DOE, pleurisy, hoarseness, laryngitis, wheezing.  Gastrointestinal: Denies dysphagia, heartburn, reflux, water brash, pain, cramps, nausea, vomiting, bloating, diarrhea, constipation, hematemesis, melena, hematochezia, jaundice, hemorrhoids Genitourinary: Denies dysuria, frequency, urgency, nocturia, hesitancy, discharge, hematuria, flank pain Breast: Breast lumps, nipple discharge, bleeding.  Musculoskeletal: Denies arthralgia, myalgia, stiffness, Jt. Swelling, pain, limp, and strain/sprain. Denies falls. Skin: Denies puritis, rash, hives, warts, acne, eczema, changing in skin lesion Neuro: No weakness, tremor, incoordination, spasms, paresthesia, pain Psychiatric: Denies confusion, memory loss, sensory loss. Denies Depression. Endocrine: Denies change in weight, skin, hair  change, nocturia, and paresthesia, diabetic polys, visual blurring, hyper / hypo glycemic episodes.  Heme/Lymph: No excessive bleeding, bruising, enlarged lymph nodes.  Physical Exam  BP 114/76   Pulse 68   Temp 97.6 F (36.4 C)   Resp 16   Ht 5' 2.5" (1.588 m)   Wt 143 lb 9.6 oz (65.1 kg)   BMI 25.85 kg/m   General Appearance: Well nourished, well groomed and in no apparent distress.  Eyes: PERRLA, EOMs, conjunctiva no swelling or erythema, normal fundi and vessels. Sinuses: No frontal/maxillary tenderness ENT/Mouth: EACs patent / TMs  nl. Nares clear without erythema, swelling, mucoid exudates. Oral hygiene is good. No erythema, swelling, or exudate. Tongue normal, non-obstructing. Tonsils not swollen or erythematous. Hearing normal.  Neck: Supple, thyroid not palpable. No bruits, nodes or JVD. Respiratory: Respiratory effort normal.  BS equal and clear bilateral without rales, rhonci, wheezing or stridor. Cardio: Heart sounds are normal with regular rate and rhythm and no murmurs, rubs or gallops. Peripheral pulses are normal and equal bilaterally without edema. No aortic or femoral bruits. Chest: symmetric with normal excursions and percussion. Breasts: Symmetric, without lumps, nipple discharge, retractions, or fibrocystic changes.  Abdomen: Flat, soft with bowel sounds active. Nontender, no guarding, rebound, hernias, masses, or organomegaly.  Lymphatics: Non tender without lymphadenopathy.  Musculoskeletal: Full ROM all peripheral extremities, joint stability, 5/5 strength, and normal gait. Skin: Warm and dry without rashes, lesions, cyanosis, clubbing or  ecchymosis.  Neuro: Cranial nerves intact, reflexes equal bilaterally. Normal muscle tone, no cerebellar symptoms. Sensation intact.  Pysch: Alert and oriented X 3, normal affect, Insight and Judgment appropriate.   Assessment and Plan  1. Annual Preventative Screening Examination  2. Essential hypertension  - EKG  12-Lead - Urinalysis, Routine w reflex microscopic - Microalbumin / creatinine urine ratio - CBC with Differential/Platelet - COMPLETE METABOLIC PANEL WITH GFR - Magnesium - TSH - DG Chest 2 View; Future  3. Hyperlipidemia, mixed  - EKG 12-Lead - TSH  4. Abnormal glucose  - EKG 12-Lead - Hemoglobin A1c - Insulin, random  5. Vitamin D deficiency  - VITAMIN D 25 Hydroxyl  6. Prediabetes  - EKG 12-Lead - Hemoglobin A1c - Insulin, random  7. Malignant neoplasm of upper lobe of lung, right ([place])  - DG Chest 2 View; Future  8. Osteoporosis  - COMPLETE METABOLIC PANEL WITH GFR  9. Screening for colorectal cancer  - POC Hemoccult Bld/Stl  10. Screening for ischemic heart disease  - EKG 12-Lead  11. FHx: heart disease  - EKG 12-Lead  12. Former smoker  - EKG 12-Lead  13. Medication management  - Urinalysis, Routine w reflex microscopic - Microalbumin / creatinine urine ratio - COMPLETE METABOLIC PANEL WITH GFR - Magnesium - TSH - Hemoglobin A1c - Insulin, random - VITAMIN D 25 Hydroxyl       Patient was counseled in prudent diet to achieve/maintain BMI less than 25 for weight control, BP monitoring, regular exercise and medications. Discussed med's effects and SE's. Screening labs and tests as requested with regular follow-up as recommended. Over 40 minutes of exam, counseling, chart review and high complex critical decision making was performed.

## 2018-10-09 NOTE — Patient Instructions (Signed)
We Do NOT Approve of  [facility] [place] Soliciting Our Patients  To Do Home Visits & We Do NOT Approve of [facility] > > > > > > > > > > > > > > > > > > > > > > > > > > > > > > > > > > > > > > >  Preventive Care for Adults  A healthy lifestyle and preventive care can promote health and wellness. Preventive health guidelines for women include the following key practices.  A routine yearly physical is a good way to check with your health care provider about your health and preventive screening. It is a chance to share any concerns and updates on your health and to receive a thorough exam.  Visit your dentist for a routine exam and preventive care every 6 months. Brush your teeth twice a day and floss once a day. Good oral hygiene prevents tooth decay and gum disease.  The frequency of eye exams is based on your age, health, family medical history, use of contact lenses, and other factors. Follow your health care provider's recommendations for frequency of eye exams.  Eat a healthy diet. Foods like vegetables, fruits, whole grains, low-fat dairy products, and lean protein foods contain the nutrients you need without too many calories. Decrease your intake of foods high in solid fats, added sugars, and salt. Eat the right amount of calories for you. Get information about a proper diet from your health care provider, if necessary.  Regular physical exercise is one of the most important things you can do for your health. Most adults should get at least 150 minutes of moderate-intensity exercise (any activity that increases your heart rate and causes you to sweat) each week. In addition, most adults need muscle-strengthening exercises on 2 or more days a week.  Maintain a healthy weight. The body mass index (BMI) is a screening tool to identify possible weight problems. It provides an estimate of body fat based on height and weight. Your health care provider can find your BMI  and can help you achieve or maintain a healthy weight. For adults 20 years and older:  A BMI below 18.5 is considered underweight.  A BMI of 18.5 to 24.9 is normal.  A BMI of 25 to 29.9 is considered overweight.  A BMI of 30 and above is considered obese.  Maintain normal blood lipids and cholesterol levels by exercising and minimizing your intake of saturated fat. Eat a balanced diet with plenty of fruit and vegetables. If your lipid or cholesterol levels are high, you are over 50, or you are at high risk for heart disease, you may need your cholesterol levels checked more frequently. Ongoing high lipid and cholesterol levels should be treated with medicines if diet and exercise are not working.  If you smoke, find out from your health care provider how to quit. If you do not use tobacco, do not start.  Lung cancer screening is recommended for adults aged 55-80 years who are at high risk for developing lung cancer because of a history of smoking. A yearly low-dose CT scan of the lungs is recommended for people who have at least a 30-pack-year history of smoking and are a current smoker or have quit within the past 15 years. A pack year of smoking is smoking an average of 1 pack of cigarettes a day for 1 year (for example: 1 pack a day for 30 years or 2 packs a day   for 15 years). Yearly screening should continue until the smoker has stopped smoking for at least 15 years. Yearly screening should be stopped for people who develop a health problem that would prevent them from having lung cancer treatment.  Avoid use of street drugs. Do not share needles with anyone. Ask for help if you need support or instructions about stopping the use of drugs.  High blood pressure causes heart disease and increases the risk of stroke.  Ongoing high blood pressure should be treated with medicines if weight loss and exercise do not work.  If you are 29-34 years old, ask your health care provider if you should take  aspirin to prevent strokes.  Diabetes screening involves taking a blood sample to check your fasting blood sugar level. This should be done once every 3 years, after age 27, if you are within normal weight and without risk factors for diabetes. Testing should be considered at a younger age or be carried out more frequently if you are overweight and have at least 1 risk factor for diabetes.  Breast cancer screening is essential preventive care for women. You should practice "breast self-awareness." This means understanding the normal appearance and feel of your breasts and may include breast self-examination. Any changes detected, no matter how [place], should be reported to a health care provider. Women in their 64s and 30s should have a clinical breast exam (CBE) by a health care provider as part of a regular health exam every 1 to 3 years. After age 69, women should have a CBE every year. Starting at age 6, women should consider having a mammogram (breast X-ray test) every year. Women who have a family history of breast cancer should talk to their health care provider about genetic screening. Women at a high risk of breast cancer should talk to their health care providers about having an MRI and a mammogram every year.  Breast cancer gene (BRCA)-related cancer risk assessment is recommended for women who have family members with BRCA-related cancers. BRCA-related cancers include breast, ovarian, tubal, and peritoneal cancers. Having family members with these cancers may be associated with an increased risk for harmful changes (mutations) in the breast cancer genes BRCA1 and BRCA2. Results of the assessment will determine the need for genetic counseling and BRCA1 and BRCA2 testing.  Routine pelvic exams to screen for cancer are no longer recommended for nonpregnant women who are considered low risk for cancer of the pelvic organs (ovaries, uterus, and vagina) and who do not have symptoms. Ask your health  care provider if a screening pelvic exam is right for you.  If you have had past treatment for cervical cancer or a condition that could lead to cancer, you need Pap tests and screening for cancer for at least 20 years after your treatment. If Pap tests have been discontinued, your risk factors (such as having a new sexual partner) need to be reassessed to determine if screening should be resumed. Some women have medical problems that increase the chance of getting cervical cancer. In these cases, your health care provider may recommend more frequent screening and Pap tests.    Colorectal cancer can be detected and often prevented. Most routine colorectal cancer screening begins at the age of 21 years and continues through age 8 years. However, your health care provider may recommend screening at an earlier age if you have risk factors for colon cancer. On a yearly basis, your health care provider may provide home test kits to check  for hidden blood in the stool. Use of a Fenstermacher camera at the end of a tube, to directly examine the colon (sigmoidoscopy or colonoscopy), can detect the earliest forms of colorectal cancer. Talk to your health care provider about this at age 50, when routine screening begins.  Direct exam of the colon should be repeated every 5-10 years through age 75 years, unless early forms of pre-cancerous polyps or Pulcini growths are found.  Osteoporosis is a disease in which the bones lose minerals and strength with aging. This can result in serious bone fractures or breaks. The risk of osteoporosis can be identified using a bone density scan. Women ages 65 years and over and women at risk for fractures or osteoporosis should discuss screening with their health care providers. Ask your health care provider whether you should take a calcium supplement or vitamin D to reduce the rate of osteoporosis.  Menopause can be associated with physical symptoms and risks. Hormone replacement therapy  is available to decrease symptoms and risks. You should talk to your health care provider about whether hormone replacement therapy is right for you.  Use sunscreen. Apply sunscreen liberally and repeatedly throughout the day. You should seek shade when your shadow is shorter than you. Protect yourself by wearing long sleeves, pants, a wide-brimmed hat, and sunglasses year round, whenever you are outdoors.  Once a month, do a whole body skin exam, using a mirror to look at the skin on your back. Tell your health care provider of new moles, moles that have irregular borders, moles that are larger than a pencil eraser, or moles that have changed in shape or color.  Stay current with required vaccines (immunizations).  Influenza vaccine. All adults should be immunized every year.  Tetanus, diphtheria, and acellular pertussis (Td, Tdap) vaccine. Pregnant women should receive 1 dose of Tdap vaccine during each pregnancy. The dose should be obtained regardless of the length of time since the last dose. Immunization is preferred during the 27th-36th week of gestation. An adult who has not previously received Tdap or who does not know her vaccine status should receive 1 dose of Tdap. This initial dose should be followed by tetanus and diphtheria toxoids (Td) booster doses every 10 years. Adults with an unknown or incomplete history of completing a 3-dose immunization series with Td-containing vaccines should begin or complete a primary immunization series including a Tdap dose. Adults should receive a Td booster every 10 years.    Zoster vaccine. One dose is recommended for adults aged 60 years or older unless certain conditions are present.    Pneumococcal 13-valent conjugate (PCV13) vaccine. When indicated, a person who is uncertain of her immunization history and has no record of immunization should receive the PCV13 vaccine. An adult aged 19 years or older who has certain medical conditions and has not  been previously immunized should receive 1 dose of PCV13 vaccine. This PCV13 should be followed with a dose of pneumococcal polysaccharide (PPSV23) vaccine. The PPSV23 vaccine dose should be obtained at least 1 or more year(s) after the dose of PCV13 vaccine. An adult aged 19 years or older who has certain medical conditions and previously received 1 or more doses of PPSV23 vaccine should receive 1 dose of PCV13. The PCV13 vaccine dose should be obtained 1 or more years after the last PPSV23 vaccine dose.    Pneumococcal polysaccharide (PPSV23) vaccine. When PCV13 is also indicated, PCV13 should be obtained first. All adults aged 65 years and older should   be immunized. An adult younger than age 65 years who has certain medical conditions should be immunized. Any person who resides in a nursing home or long-term care facility should be immunized. An adult smoker should be immunized. People with an immunocompromised condition and certain other conditions should receive both PCV13 and PPSV23 vaccines. People with human immunodeficiency virus (HIV) infection should be immunized as soon as possible after diagnosis. Immunization during chemotherapy or radiation therapy should be avoided. Routine use of PPSV23 vaccine is not recommended for American Indians, [place] Natives, or people younger than 65 years unless there are medical conditions that require PPSV23 vaccine. When indicated, people who have unknown immunization and have no record of immunization should receive PPSV23 vaccine. One-time revaccination 5 years after the first dose of PPSV23 is recommended for people aged 19-64 years who have chronic kidney failure, nephrotic syndrome, asplenia, or immunocompromised conditions. People who received 1-2 doses of PPSV23 before age 65 years should receive another dose of PPSV23 vaccine at age 65 years or later if at least 5 years have passed since the previous dose. Doses of PPSV23 are not needed for people immunized  with PPSV23 at or after age 65 years.   Preventive Services / Frequency  Ages 65 years and over  Blood pressure check.  Lipid and cholesterol check.  Lung cancer screening. / Every year if you are aged 55-80 years and have a 30-pack-year history of smoking and currently smoke or have quit within the past 15 years. Yearly screening is stopped once you have quit smoking for at least 15 years or develop a health problem that would prevent you from having lung cancer treatment.  Clinical breast exam.** / Every year after age 40 years.   BRCA-related cancer risk assessment.** / For women who have family members with a BRCA-related cancer (breast, ovarian, tubal, or peritoneal cancers).  Mammogram.** / Every year beginning at age 40 years and continuing for as long as you are in good health. Consult with your health care provider.  Pap test.** / Every 3 years starting at age 30 years through age 65 or 70 years with 3 consecutive normal Pap tests. Testing can be stopped between 65 and 70 years with 3 consecutive normal Pap tests and no abnormal Pap or HPV tests in the past 10 years.  Fecal occult blood test (FOBT) of stool. / Every year beginning at age 50 years and continuing until age 75 years. You may not need to do this test if you get a colonoscopy every 10 years.  Flexible sigmoidoscopy or colonoscopy.** / Every 5 years for a flexible sigmoidoscopy or every 10 years for a colonoscopy beginning at age 50 years and continuing until age 75 years.  Hepatitis C blood test.** / For all people born from 1945 through 1965 and any individual with known risks for hepatitis C.  Osteoporosis screening.** / A one-time screening for women ages 65 years and over and women at risk for fractures or osteoporosis.  Skin self-exam. / Monthly.  Influenza vaccine. / Every year.  Tetanus, diphtheria, and acellular pertussis (Tdap/Td) vaccine.** / 1 dose of Td every 10 years.  Zoster vaccine.** / 1 dose  for adults aged 60 years or older.  Pneumococcal 13-valent conjugate (PCV13) vaccine.** / Consult your health care provider.  Pneumococcal polysaccharide (PPSV23) vaccine.** / 1 dose for all adults aged 65 years and older. Screening for abdominal aortic aneurysm (AAA)  by ultrasound is recommended for people who have history of high blood pressure   or who are current or former smokers. ++++++++++++++++++++ Recommend Adult Low Dose Aspirin or  coated  Aspirin 81 mg daily  To reduce risk of Colon Cancer 20 %,  Skin Cancer 26 % ,  Melanoma 46%  and  Pancreatic cancer 60% ++++++++++++++++++++ Vitamin D goal  is between 70-100.  Please make sure that you are taking your Vitamin D as directed.  It is very important as a natural anti-inflammatory  helping hair, skin, and nails, as well as reducing stroke and heart attack risk.  It helps your bones and helps with mood. It also decreases numerous cancer risks so please take it as directed.  Low Vit D is associated with a 200-300% higher risk for CANCER  and 200-300% higher risk for HEART   ATTACK  &  STROKE.   ...................................... It is also associated with higher death rate at younger ages,  autoimmune diseases like Rheumatoid arthritis, Lupus, Multiple Sclerosis.    Also many other serious conditions, like depression, Alzheimer's Dementia, infertility, muscle aches, fatigue, fibromyalgia - just to name a few. ++++++++++++++++++ Recommend the book "The END of DIETING" by Dr Joel Fuhrman  & the book "The END of DIABETES " by Dr Joel Fuhrman At [employer] - get book & Audio CD's    Being diabetic has a  300% increased risk for heart attack, stroke, cancer, and alzheimer- type vascular dementia. It is very important that you work harder with diet by avoiding all foods that are white. Avoid white rice (brown & wild rice is OK), white potatoes (sweetpotatoes in moderation is OK), White bread or wheat bread or anything made out of  white flour like bagels, donuts, rolls, buns, biscuits, cakes, pastries, cookies, pizza crust, and pasta (made from white flour & egg whites) - vegetarian pasta or spinach or wheat pasta is OK. Multigrain breads like Arnold's or Pepperidge Farm, or multigrain sandwich thins or flatbreads.  Diet, exercise and weight loss can reverse and cure diabetes in the early stages.  Diet, exercise and weight loss is very important in the control and prevention of complications of diabetes which affects every system in your body, ie. Brain - dementia/stroke, eyes - glaucoma/blindness, heart - heart attack/heart failure, kidneys - dialysis, stomach - gastric paralysis, intestines - malabsorption, nerves - severe painful neuritis, circulation - gangrene & loss of a leg(s), and finally cancer and Alzheimers.    I recommend avoid fried & greasy foods,  sweets/candy, white rice (brown or wild rice or Quinoa is OK), white potatoes (sweet potatoes are OK) - anything made from white flour - bagels, doughnuts, rolls, buns, biscuits,white and wheat breads, pizza crust and traditional pasta made of white flour & egg white(vegetarian pasta or spinach or wheat pasta is OK).  Multi-grain bread is OK - like multi-grain flat bread or sandwich thins. Avoid alcohol in excess. Exercise is also important.    Eat all the vegetables you want - avoid meat, especially red meat and dairy - especially cheese.  Cheese is the most concentrated form of trans-fats which is the worst thing to clog up our arteries. Veggie cheese is OK which can be found in the fresh produce section at [employer] or [employer] or [employer]  +++++++++++++++++++ DASH Eating Plan  DASH stands for "Dietary Approaches to Stop Hypertension."   The DASH eating plan is a healthy eating plan that has been shown to reduce high blood pressure (hypertension). Additional health benefits may include reducing the risk of type 2 diabetes mellitus, heart disease,   and stroke. The  DASH eating plan may also help with weight loss. WHAT DO I NEED TO KNOW ABOUT THE DASH EATING PLAN? For the DASH eating plan, you will follow these general guidelines:  Choose foods with a percent daily value for sodium of less than 5% (as listed on the food label).  Use salt-free seasonings or herbs instead of table salt or sea salt.  Check with your health care provider or pharmacist before using salt substitutes.  Eat lower-sodium products, often labeled as "lower sodium" or "no salt added."  Eat fresh foods.  Eat more vegetables, fruits, and low-fat dairy products.  Choose whole grains. Look for the word "whole" as the first word in the ingredient list.  Choose fish   Limit sweets, desserts, sugars, and sugary drinks.  Choose heart-healthy fats.  Eat veggie cheese   Eat more home-cooked food and less restaurant, buffet, and fast food.  Limit fried foods.  Cook foods using methods other than frying.  Limit canned vegetables. If you do use them, rinse them well to decrease the sodium.  When eating at a restaurant, ask that your food be prepared with less salt, or no salt if possible.                      WHAT FOODS CAN I EAT? Read Dr Fara Olden Fuhrman's books on The End of Dieting & The End of Diabetes  Grains Whole grain or whole wheat bread. Brown rice. Whole grain or whole wheat pasta. Quinoa, bulgur, and whole grain cereals. Low-sodium cereals. Corn or whole wheat flour tortillas. Whole grain cornbread. Whole grain crackers. Low-sodium crackers.  Vegetables Fresh or frozen vegetables (raw, steamed, roasted, or grilled). Low-sodium or reduced-sodium tomato and vegetable juices. Low-sodium or reduced-sodium tomato sauce and paste. Low-sodium or reduced-sodium canned vegetables.   Fruits All fresh, canned (in natural juice), or frozen fruits.  Protein Products  All fish and seafood.  Dried beans, peas, or lentils. Unsalted nuts and seeds. Unsalted canned  beans.  Dairy Low-fat dairy products, such as skim or 1% milk, 2% or reduced-fat cheeses, low-fat ricotta or cottage cheese, or plain low-fat yogurt. Low-sodium or reduced-sodium cheeses.  Fats and Oils Tub margarines without trans fats. Light or reduced-fat mayonnaise and salad dressings (reduced sodium). Avocado. Safflower, olive, or canola oils. Natural peanut or almond butter.  Other Unsalted popcorn and pretzels. The items listed above may not be a complete list of recommended foods or beverages. Contact your dietitian for more options.  +++++++++++++++  WHAT FOODS ARE NOT RECOMMENDED? Grains/ White flour or wheat flour White bread. White pasta. White rice. Refined cornbread. Bagels and croissants. Crackers that contain trans fat.  Vegetables  Creamed or fried vegetables. Vegetables in a . Regular canned vegetables. Regular canned tomato sauce and paste. Regular tomato and vegetable juices.  Fruits Dried fruits. Canned fruit in light or heavy syrup. Fruit juice.  Meat and Other Protein Products Meat in general - RED meat & White meat.  Fatty cuts of meat. Ribs, chicken wings, all processed meats as bacon, sausage, bologna, salami, fatback, hot dogs, bratwurst and packaged luncheon meats.  Dairy Whole or 2% milk, cream, half-and-half, and cream cheese. Whole-fat or sweetened yogurt. Full-fat cheeses or blue cheese. Non-dairy creamers and whipped toppings. Processed cheese, cheese spreads, or cheese curds.  Condiments Onion and garlic salt, seasoned salt, table salt, and sea salt. Canned and packaged gravies. Worcestershire sauce. Tartar sauce. Barbecue sauce. Teriyaki sauce. Soy sauce, including reduced  sodium. Steak sauce. Fish sauce. Oyster sauce. Cocktail sauce. Horseradish. Ketchup and mustard. Meat flavorings and tenderizers. Bouillon cubes. Hot sauce. Tabasco sauce. Marinades. Taco seasonings. Relishes.  Fats and Oils Butter, stick margarine, lard, shortening and bacon  fat. Coconut, palm kernel, or palm oils. Regular salad dressings.  Pickles and olives. Salted popcorn and pretzels.  The items listed above may not be a complete list of foods and beverages to avoid.

## 2018-10-10 LAB — MICROALBUMIN / CREATININE URINE RATIO
Creatinine, Urine: 72 mg/dL (ref 20–275)
MICROALB/CREAT RATIO: 49 ug/mg{creat} — AB (ref <30)
Microalb, Ur: 3.5 mg/dL

## 2018-10-10 LAB — COMPLETE METABOLIC PANEL WITH GFR
AG RATIO: 1.7 (calc) (ref 1.0–2.5)
ALT: 13 U/L (ref 6–29)
AST: 26 U/L (ref 10–35)
Albumin: 4.6 g/dL (ref 3.6–5.1)
Alkaline phosphatase (APISO): 82 U/L (ref 33–130)
BILIRUBIN TOTAL: 0.4 mg/dL (ref 0.2–1.2)
BUN: 19 mg/dL (ref 7–25)
CHLORIDE: 100 mmol/L (ref 98–110)
CO2: 34 mmol/L — ABNORMAL HIGH (ref 20–32)
Calcium: 9.8 mg/dL (ref 8.6–10.4)
Creat: 0.84 mg/dL (ref 0.60–0.88)
GFR, EST AFRICAN AMERICAN: 74 mL/min/{1.73_m2} (ref >=60)
GFR, Est Non African American: 64 mL/min/{1.73_m2} (ref >=60)
GLOBULIN: 2.7 g/dL (ref 1.9–3.7)
Glucose, Bld: 95 mg/dL (ref 65–99)
POTASSIUM: 3.8 mmol/L (ref 3.5–5.3)
SODIUM: 140 mmol/L (ref 135–146)
TOTAL PROTEIN: 7.3 g/dL (ref 6.1–8.1)

## 2018-10-10 LAB — CBC WITH DIFFERENTIAL/PLATELET
BASOS ABS: 38 {cells}/uL (ref 0–200)
Basophils Relative: 0.6 %
EOS ABS: 107 {cells}/uL (ref 15–500)
EOS PCT: 1.7 %
HCT: 41.5 % (ref 35.0–45.0)
HEMOGLOBIN: 13.2 g/dL (ref 11.7–15.5)
Lymphs Abs: 1481 cells/uL (ref 850–3900)
MCH: 27.8 pg (ref 27.0–33.0)
MCHC: 31.8 g/dL — AB (ref 32.0–36.0)
MCV: 87.6 fL (ref 80.0–100.0)
MONOS PCT: 9.2 %
MPV: 10.1 fL (ref 7.5–12.5)
NEUTROS ABS: 4095 {cells}/uL (ref 1500–7800)
NEUTROS PCT: 65 %
Platelets: 204 10*3/uL (ref 140–400)
RBC: 4.74 10*6/uL (ref 3.80–5.10)
RDW: 11.8 % (ref 11.0–15.0)
Total Lymphocyte: 23.5 %
WBC mixed population: 580 cells/uL (ref 200–950)
WBC: 6.3 10*3/uL (ref 3.8–10.8)

## 2018-10-10 LAB — URINALYSIS, ROUTINE W REFLEX MICROSCOPIC
Bilirubin Urine: NEGATIVE
GLUCOSE, UA: NEGATIVE
HGB URINE DIPSTICK: NEGATIVE
KETONES UR: NEGATIVE
LEUKOCYTES UA: NEGATIVE
NITRITE: NEGATIVE
PROTEIN: NEGATIVE
Specific Gravity, Urine: 1.014 (ref 1.001–1.03)
pH: 6 (ref 5.0–8.0)

## 2018-10-10 LAB — HEMOGLOBIN A1C
Hgb A1c MFr Bld: 5.6 % of total Hgb (ref <5.7)
Mean Plasma Glucose: 114 (calc)
eAG (mmol/L): 6.3 (calc)

## 2018-10-10 LAB — VITAMIN D 25 HYDROXY (VIT D DEFICIENCY, FRACTURES): VIT D 25 HYDROXY: 95 ng/mL (ref 30–100)

## 2018-10-10 LAB — INSULIN, RANDOM: INSULIN: 7.2 u[IU]/mL (ref 2.0–19.6)

## 2018-10-10 LAB — MAGNESIUM: Magnesium: 1.6 mg/dL (ref 1.5–2.5)

## 2018-10-10 LAB — TSH: TSH: 1.01 mIU/L (ref 0.40–4.50)

## 2018-12-18 ENCOUNTER — Other Ambulatory Visit: Payer: Self-pay | Admitting: Internal Medicine

## 2019-01-13 NOTE — Progress Notes (Deleted)
MEDICARE ANNUAL WELLNESS VISIT AND FOLLOW UP  Assessment:   Encounter for annual medicare wellness exam  Essential hypertension - continue medications, DASH diet, exercise and monitor at home. Call if greater than 130/80.  - can cut HCTZ in half - CBC with Differential/Platelet - BASIC METABOLIC PANEL WITH GFR - Hepatic function panel - TSH   Hyperlipidemia -continue medications, check lipids, decrease fatty foods, increase activity.  - Lipid panel  Abnormal glucose Discussed general issues about diabetes pathophysiology and management., Educational material distributed., Suggested low cholesterol diet., Encouraged aerobic exercise., Discussed foot care., Reminded to get yearly retinal exam. - Hemoglobin A1c   Vitamin D deficiency - Vit D  25 hydroxy (rtn osteoporosis monitoring)   Medication management - Magnesium  COPD monitor   Atherosclerosis of aorta Control blood pressure, cholesterol, glucose, increase exercise.   Hx of cancer of lung CXR 10/2018 normal, continue follow up   Osteoporosis Continue vitamin D, Ca, stop alendronate until next year - has been on for 3 years  Environmental allergies Allergic rhinitis- Allegra OTC, increase H20, allergy hygiene explained.  Future Appointments  Date Time Provider [place]  01/15/2019 10:30 AM Vicie Mutters, PA-C [facility] None  04/23/2019 10:30 AM Unk Pinto, MD [facility] None  07/09/2019 10:45 AM Liane Comber, NP [facility] None  10/28/2019 10:00 AM Unk Pinto, MD [facility] None     Plan:   During the course of the visit the patient was educated and counseled about appropriate screening and preventive services including:    Pneumococcal vaccine   Influenza vaccine  Td vaccine  Screening electrocardiogram  Screening mammography  Bone densitometry screening  Colorectal cancer screening  Diabetes screening  Glaucoma screening  Nutrition counseling   Advanced  directives: given info/requested  Subjective:   Penny [name] is a 85 y.o. female who presents for Medicare Annual Wellness Visit and 3 month follow up on hypertension, prediabetes, hyperlipidemia, vitamin D def.   BMI is There is no height or weight on file to calculate BMI., she has been working on diet and exercise, working part time at [employer] 3 days a week [employer] from Last 3 Encounters:  10/09/18 143 lb 9.6 oz (65.1 kg)  07/03/18 142 lb (64.4 kg)  02/20/18 138 lb 6.4 oz (62.8 kg)   Her blood pressure has been controlled at home, today their BP is   She does not workout but is very active and does try to walk, works 3 days a week, Youth worker. She denies chest pain, shortness of breath, dizziness.   She is on cholesterol medication, simvastatin 40 and denies myalgias. Her cholesterol is at goal. The cholesterol last visit was:   Lab Results  Component Value Date   CHOL 174 07/03/2018   HDL 74 07/03/2018   LDLCALC 84 07/03/2018   TRIG 71 07/03/2018   CHOLHDL 2.4 07/03/2018   She has been working on diet and exercise for prediabetes, and denies paresthesia of the feet, polydipsia and polyuria. Last A1C in the office was:  Lab Results  Component Value Date   HGBA1C 5.6 10/09/2018   Patient is on Vitamin D supplement. Lab Results  Component Value Date   VD25OH 95 10/09/2018        Medication Review Current Outpatient Medications on File Prior to Visit  Medication Sig Dispense Refill  . aspirin 81 MG chewable tablet Chew 81 mg by mouth daily.    . Cholecalciferol (VITAMIN D PO) Take 1-2 tablets by mouth See admin  instructions. Vitamin d 5000 units per patient. Take twice every day except on Monday, Wednesday, and Fridays take 1 tablet    . diphenhydramine-acetaminophen (TYLENOL PM) 25-500 MG TABS Take 1 tablet by mouth at bedtime. Take every night per patient    . hydrochlorothiazide (HYDRODIURIL) 25 MG tablet TAKE 1 TABLET(25 MG) BY MOUTH DAILY 90  tablet 0  . metoprolol tartrate (LOPRESSOR) 50 MG tablet TAKE 1 TABLET BY MOUTH TWICE DAILY 180 tablet 0  . simvastatin (ZOCOR) 40 MG tablet TAKE 1 TABLET BY MOUTH EVERY NIGHT AT BEDTIME FOR CHOLESTEROL 90 tablet 1   No current facility-administered medications on file prior to visit.     Current Problems (verified) Patient Active Problem List   Diagnosis Date Noted  . Abnormal glucose 10/09/2018  . Osteoporosis 12/28/2015  . COPD (chronic obstructive pulmonary disease) with emphysema ([place]) 02/25/2015  . Atherosclerosis of aorta ([place]) 02/25/2015  . Prediabetes 04/15/2014  . Encounter for general adult medical examination with abnormal findings 04/15/2014  . Essential hypertension   . Hyperlipidemia, mixed   . Vitamin D deficiency   . Personal history of lung cancer 12/04/1996    Immunization History  Administered Date(s) Administered  . Influenza, High Dose Seasonal PF 09/15/2015, 10/02/2017  . Influenza-Unspecified 08/04/2012, 09/03/2013, 09/03/2018  . Pneumococcal Conjugate-13 07/21/2014  . Pneumococcal Polysaccharide-23 10/30/2005  . Td 04/03/2013   Preventative care: Last colonoscopy: 2012 done  Last mammogram: 03/2018 Last pap smear/pelvic exam: 2012 negative DEXA: 02/2017, has been on alendronate since 2016, will stop this year- DEXA DUE Echo 2009 normal EF CXR 2019 CT cervical spin 10/2014 CT head 10/20/2014  Prior vaccinations: TD or Tdap: 2014  Influenza: 2016 Pneumococcal: 2006 Prevnar 13: 2015 Shingles/Zostavax: declines due to cost  Names of Other Physician/Practitioners you currently use: 1.  [facility]- here for primary care 2. Dr. Prudencio Burly, eye doctor, last visit 2019 3. , dentist, last visit 07/2015 4. Dr. Allyson Sabal, derm, 2019, goes annually  Patient Care Team: Unk Pinto, MD as PCP - General (Internal Medicine) Druscilla Brownie, MD as Consulting Physician (Dermatology) Daryll Brod, MD as Consulting  Physician (Orthopedic Surgery) Richmond Campbell, MD as Consulting Physician (Gastroenterology) Nicanor Alcon, MD as Attending Physician (Thoracic Surgery)  Allergies Allergies  Allergen Reactions  . Ace Inhibitors Cough  . Calcium-Containing Compounds Other (See Comments)    constpation  . Lactose Intolerance (Gi) Diarrhea  . Polysporin [Bacitracin-Polymyxin B] Rash    Polysporin or neosporin gave her a rash, couldn't remember which    SURGICAL HISTORY She  has a past surgical history that includes Carpal tunnel release (Right); Shoulder adhesion release (Right); Lung removal, partial (Right); and Olecranon bursectomy (Left, 03/20/2017). FAMILY HISTORY Her family history includes Breast cancer in her paternal aunt; Heart disease in her mother; Lung cancer in her father. SOCIAL HISTORY She  reports that she quit smoking about 22 years ago. She has never used smokeless tobacco. She reports current alcohol use of about 1.0 standard drinks of alcohol per week. She reports that she does not use drugs.  MEDICARE WELLNESS OBJECTIVES: Physical activity:   Cardiac risk factors:   Depression/mood screen:   Depression screen [facility] 2/9 10/09/2018  Decreased Interest 0  Down, Depressed, Hopeless 0  PHQ - 2 Score 0    ADLs:  In your present state of health, do you have any difficulty performing the following activities: 10/09/2018 07/03/2018  Hearing? N N  Vision? N N  Difficulty concentrating or making decisions? N N  Walking or climbing stairs? N N  Dressing or bathing? N N  Doing errands, shopping? N N  Some recent data might be hidden     Cognitive Testing  Alert? Yes  Normal Appearance?Yes  Oriented to person? Yes  Place? Yes   Time? Yes  Recall of three objects?  Yes  Can perform simple calculations? Yes  Displays appropriate judgment?Yes  Can read the correct time from a watch face?Yes  EOL planning:     Objective:   There were no vitals taken for this visit. There is no  height or weight on file to calculate BMI.  General appearance: alert, no distress, WD/WN,  female HEENT: normocephalic, sclerae anicteric, TMs pearly, nares patent, no discharge or erythema, pharynx normal Oral cavity: MMM, no lesions Neck: supple, no lymphadenopathy, no thyromegaly, no masses Heart: RRR, normal S1, S2, no murmurs Lungs: CTA bilaterally, no wheezes, rhonchi, or rales Abdomen: +bs, soft, non tender, non distended, no masses, no hepatomegaly, no splenomegaly Musculoskeletal:  no swelling, no obvious deformity Extremities: no edema, no cyanosis, no clubbing Pulses: 2+ symmetric, upper and lower extremities, normal cap refill Neurological: alert, oriented x 3, CN2-12 intact, strength normal upper extremities and lower extremities, sensation normal throughout, DTRs 2+ throughout, no cerebellar signs, gait normal Psychiatric: normal affect, behavior normal, pleasant  Breast: defer Gyn: defer Rectal: defer  Medicare Attestation I have personally reviewed: The patient's medical and social history Their use of alcohol, tobacco or illicit drugs Their current medications and supplements The patient's functional ability including ADLs,fall risks, home safety risks, cognitive, and hearing and visual impairment Diet and physical activities Evidence for depression or mood disorders  The patient's weight, height, BMI, and visual acuity have been recorded in the chart.  I have made referrals, counseling, and provided education to the patient based on review of the above and I have provided the patient with a written personalized care plan for preventive services.     Vicie Mutters, PA-C   01/13/2019

## 2019-01-15 ENCOUNTER — Ambulatory Visit: Payer: Self-pay | Admitting: Physician Assistant

## 2019-01-24 ENCOUNTER — Other Ambulatory Visit: Payer: Self-pay | Admitting: Physician Assistant

## 2019-01-24 DIAGNOSIS — Z1231 Encounter for screening mammogram for malignant neoplasm of breast: Secondary | ICD-10-CM

## 2019-01-28 NOTE — Progress Notes (Signed)
MEDICARE ANNUAL WELLNESS VISIT AND FOLLOW UP  Assessment:   Encounter for annual medicare wellness exam 1 year  Essential hypertension - continue medications, DASH diet, exercise and monitor at home. Call if greater than 130/80.  - can cut HCTZ in half - CBC with Differential/Platelet - BASIC METABOLIC PANEL WITH GFR - Hepatic function panel - TSH   Hyperlipidemia -stop simvastatin and start crestor check lipids, decrease fatty foods, increase activity.  - Lipid panel -     rosuvastatin (CRESTOR) 5 MG tablet; Take 1 tablet (5 mg total) by mouth at bedtime.  Abnormal glucose Discussed general issues about diabetes pathophysiology and management., Educational material distributed., Suggested low cholesterol diet., Encouraged aerobic exercise., Discussed foot care., Reminded to get yearly retinal exam. - Hemoglobin A1c   Vitamin D deficiency - Vit D  25 hydroxy (rtn osteoporosis monitoring)   Medication management - Magnesium   Atherosclerosis of aorta Control blood pressure, cholesterol, glucose, increase exercise.   Hx of cancer of lung CXR 10/2018 normal, continue follow up   Osteoporosis Continue vitamin D, Ca, stop alendronate until next year - has been on for 3 years  Environmental allergies Allergic rhinitis- Allegra OTC, increase H20, allergy hygiene explained.  B12 deficiency -     Iron,Total/Total Iron Binding Cap  Iron deficiency -     Vitamin B12  Medication management -     Magnesium  Atherosclerosis of aorta (H[facility]) Control blood pressure, cholesterol, glucose, increase exercise.   Pulmonary emphysema, unspecified emphysema type ([place]) Monitor  Memory issues check B12, good recall on exam, may be from trying to sell house/stress, will monitor next OV She was informed to call 911 if she develop any new symptoms such as worsening headaches, episodes of blurred vision, double vision or complete loss of vision or speech difficulties or motor  weakness. B12 deficiency -     Iron,Total/Total Iron Binding Cap  Iron deficiency -     Vitamin B12   Future Appointments  Date Time Provider [place]  03/12/2019 10:30 AM [facility] 3 [facility]  04/23/2019 10:30 AM Unk Pinto, MD [facility] None  07/09/2019 10:45 AM Liane Comber, NP [facility] None  10/28/2019 10:00 AM Unk Pinto, MD [facility] None     Plan:   During the course of the visit the patient was educated and counseled about appropriate screening and preventive services including:    Pneumococcal vaccine   Influenza vaccine  Td vaccine  Screening electrocardiogram  Screening mammography  Bone densitometry screening  Colorectal cancer screening  Diabetes screening  Glaucoma screening  Nutrition counseling   Advanced directives: given info/requested  Subjective:   Penny [name] is a 85 y.o. female who presents for Medicare Annual Wellness Visit and 3 month follow up on hypertension, prediabetes, hyperlipidemia, vitamin D def.   Putting house on the market in March and going to move into a townhouse. 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 [place] daughter, lives with her 5-6 years ago after divorce and 2 boys come here, at [facility] and one is working, Scientist, physiological he comes here.   BMI is Body mass index is 26.19 kg/m., she has been working on diet and exercise.  Wt Readings from Last 3 Encounters:  01/29/19 143 lb 3.2 oz (65 kg)  10/09/18 143 lb 9.6 oz (65.1 kg)  07/03/18 142 lb (64.4 kg)   Her blood pressure has been controlled at home, today their BP is BP: 124/68 She does not workout but is very active  and does try to walk.  She denies chest pain, shortness of breath, dizziness.   She is on cholesterol medication, simvastatin 40 and denies myalgias. Her cholesterol is at goal. The cholesterol last visit was:   Lab Results  Component Value Date   CHOL 174 07/03/2018   HDL 74 07/03/2018   LDLCALC 84 07/03/2018   TRIG 71  07/03/2018   CHOLHDL 2.4 07/03/2018   She has been working on diet and exercise for prediabetes, and denies paresthesia of the feet, polydipsia and polyuria. Last A1C in the office was:  Lab Results  Component Value Date   HGBA1C 5.6 10/09/2018   Patient is on Vitamin D supplement. Lab Results  Component Value Date   VD25OH 95 10/09/2018      She is on prevagin and memory pills, states she will put things places and can not remember where she puts it. Has a hard time remembering names. She has no issues with sleeping, she wakes up resting well. No numbness/tingling in hands or feet. Has not had B12 checked.   Medication Review Current Outpatient Medications on File Prior to Visit  Medication Sig Dispense Refill  . aspirin 81 MG chewable tablet Chew 81 mg by mouth daily.    . Cholecalciferol (VITAMIN D PO) Take 1-2 tablets by mouth See admin instructions. Vitamin d 5000 units per patient. Take twice every day except on Monday, Wednesday, and Fridays take 1 tablet    . diphenhydramine-acetaminophen (TYLENOL PM) 25-500 MG TABS Take 1 tablet by mouth at bedtime. Take every night per patient    . hydrochlorothiazide (HYDRODIURIL) 25 MG tablet TAKE 1 TABLET(25 MG) BY MOUTH DAILY 90 tablet 0  . metoprolol tartrate (LOPRESSOR) 50 MG tablet TAKE 1 TABLET BY MOUTH TWICE DAILY 180 tablet 0  . OVER THE COUNTER MEDICATION     . simvastatin (ZOCOR) 40 MG tablet TAKE 1 TABLET BY MOUTH EVERY NIGHT AT BEDTIME FOR CHOLESTEROL 90 tablet 1   No current facility-administered medications on file prior to visit.     Current Problems (verified) Patient Active Problem List   Diagnosis Date Noted  . Abnormal glucose 10/09/2018  . Osteoporosis 12/28/2015  . COPD (chronic obstructive pulmonary disease) with emphysema ([place]) 02/25/2015  . Atherosclerosis of aorta ([place]) 02/25/2015  . Essential hypertension   . Hyperlipidemia, mixed   . Vitamin D deficiency   . Personal history of lung cancer 12/04/1996     Immunization History  Administered Date(s) Administered  . Influenza, High Dose Seasonal PF 09/15/2015, 10/02/2017  . Influenza-Unspecified 08/04/2012, 09/03/2013, 09/03/2018  . Pneumococcal Conjugate-13 07/21/2014  . Pneumococcal Polysaccharide-23 10/30/2005  . Td 04/03/2013   Preventative care: Last colonoscopy: 2012 done  Last mammogram: 03/2018 Last pap smear/pelvic exam: 2012 negative DEXA: 02/2017, has been on alendronate since 2016- stopped 2019- will repeat this year with [facility] Echo 2009 normal EF CXR 2019 CT cervical spin 10/2014 CT head 10/20/2014  Prior vaccinations: TD or Tdap: 2014  Influenza: 2016 Pneumococcal: 2006 Prevnar 13: 2015 Shingles/Zostavax: declines due to cost  Names of Other Physician/Practitioners you currently use: 1. [facility]- here for primary care 2. Dr. Prudencio Burly, eye doctor, last visit 2019 3. , dentist, last visit 07/2015 4. Dr. Allyson Sabal, derm, 2019, goes annually  Patient Care Team: Unk Pinto, MD as PCP - General (Internal Medicine) Druscilla Brownie, MD as Consulting Physician (Dermatology) Daryll Brod, MD as Consulting Physician (Orthopedic Surgery) Richmond Campbell, MD as Consulting Physician (Gastroenterology) Nicanor Alcon, MD as  Attending Physician (Thoracic Surgery)  Allergies Allergies  Allergen Reactions  . Ace Inhibitors Cough  . Calcium-Containing Compounds Other (See Comments)    constpation  . Lactose Intolerance (Gi) Diarrhea  . Polysporin [Bacitracin-Polymyxin B] Rash    Polysporin or neosporin gave her a rash, couldn't remember which    SURGICAL HISTORY She  has a past surgical history that includes Carpal tunnel release (Right); Shoulder adhesion release (Right); Lung removal, partial (Right); and Olecranon bursectomy (Left, 03/20/2017). FAMILY HISTORY Her family history includes Breast cancer in her paternal aunt; Heart disease in her mother; Lung cancer in her  father. SOCIAL HISTORY She  reports that she quit smoking about 22 years ago. She has never used smokeless tobacco. She reports current alcohol use of about 1.0 standard drinks of alcohol per week. She reports that she does not use drugs.  MEDICARE WELLNESS OBJECTIVES: Hearing test scheduled today- [facility] Physical activity:   Cardiac risk factors:   Depression/mood screen:   Depression screen [facility] 2/9 10/09/2018  Decreased Interest 0  Down, Depressed, Hopeless 0  PHQ - 2 Score 0    ADLs:  In your present state of health, do you have any difficulty performing the following activities: 10/09/2018 07/03/2018  Hearing? N N  Vision? N N  Difficulty concentrating or making decisions? N N  Walking or climbing stairs? N N  Dressing or bathing? N N  Doing errands, shopping? N N  Some recent data might be hidden     Cognitive Testing  Alert? Yes  Normal Appearance?Yes  Oriented to person? Yes  Place? Yes   Time? Yes  Recall of three objects?  Yes  Can perform simple calculations? Yes  Displays appropriate judgment?Yes  Can read the correct time from a watch face?Yes  EOL planning: Does Patient Have a Medical Advance Directive?: Yes Type of Advance Directive: Healthcare Power of Attorney, Living will [place] in Chart?: No - copy requested   Objective:   Blood pressure 124/68, pulse 66, temperature 98.1 F (36.7 C), height 5\' 2"  (1.575 m), weight 143 lb 3.2 oz (65 kg), SpO2 96 %. Body mass index is 26.19 kg/m.  General appearance: alert, no distress, WD/WN,  female HEENT: normocephalic, sclerae anicteric, TMs pearly, nares patent, no discharge or erythema, pharynx normal Oral cavity: MMM, no lesions Neck: supple, no lymphadenopathy, no thyromegaly, no masses Heart: RRR, normal S1, S2, no murmurs Lungs: CTA bilaterally, no wheezes, rhonchi, or rales Abdomen: +bs, soft, non tender, non distended, no masses, no hepatomegaly, no  splenomegaly Musculoskeletal:  no swelling, no obvious deformity Extremities: no edema, no cyanosis, no clubbing Pulses: 2+ symmetric, upper and lower extremities, normal cap refill Neurological: alert, oriented x 3, CN2-12 intact, strength normal upper extremities and lower extremities, sensation normal throughout, DTRs 2+ throughout, no cerebellar signs, gait normal Psychiatric: normal affect, behavior normal, pleasant  Breast: defer Gyn: defer Rectal: defer  Medicare Attestation I have personally reviewed: The patient's medical and social history Their use of alcohol, tobacco or illicit drugs Their current medications and supplements The patient's functional ability including ADLs,fall risks, home safety risks, cognitive, and hearing and visual impairment Diet and physical activities Evidence for depression or mood disorders  The patient's weight, height, BMI, and visual acuity have been recorded in the chart.  I have made referrals, counseling, and provided education to the patient based on review of the above and I have provided the patient with a written personalized care plan for  preventive services.     Vicie Mutters, PA-C   01/29/2019

## 2019-01-29 ENCOUNTER — Encounter: Payer: Self-pay | Admitting: Physician Assistant

## 2019-01-29 ENCOUNTER — Ambulatory Visit (INDEPENDENT_AMBULATORY_CARE_PROVIDER_SITE_OTHER): Payer: Medicare Other | Admitting: Physician Assistant

## 2019-01-29 VITALS — BP 124/68 | HR 66 | Temp 98.1°F | Ht 62.0 in | Wt 143.2 lb

## 2019-01-29 DIAGNOSIS — R6889 Other general symptoms and signs: Secondary | ICD-10-CM | POA: Diagnosis not present

## 2019-01-29 DIAGNOSIS — E538 Deficiency of other specified B group vitamins: Secondary | ICD-10-CM | POA: Diagnosis not present

## 2019-01-29 DIAGNOSIS — I700 Atherosclerosis of aorta: Secondary | ICD-10-CM | POA: Diagnosis not present

## 2019-01-29 DIAGNOSIS — Z85118 Personal history of other malignant neoplasm of bronchus and lung: Secondary | ICD-10-CM | POA: Diagnosis not present

## 2019-01-29 DIAGNOSIS — J439 Emphysema, unspecified: Secondary | ICD-10-CM | POA: Diagnosis not present

## 2019-01-29 DIAGNOSIS — Z0000 Encounter for general adult medical examination without abnormal findings: Secondary | ICD-10-CM

## 2019-01-29 DIAGNOSIS — E559 Vitamin D deficiency, unspecified: Secondary | ICD-10-CM

## 2019-01-29 DIAGNOSIS — Z79899 Other long term (current) drug therapy: Secondary | ICD-10-CM | POA: Diagnosis not present

## 2019-01-29 DIAGNOSIS — Z0001 Encounter for general adult medical examination with abnormal findings: Secondary | ICD-10-CM

## 2019-01-29 DIAGNOSIS — E782 Mixed hyperlipidemia: Secondary | ICD-10-CM

## 2019-01-29 DIAGNOSIS — I10 Essential (primary) hypertension: Secondary | ICD-10-CM

## 2019-01-29 DIAGNOSIS — M810 Age-related osteoporosis without current pathological fracture: Secondary | ICD-10-CM

## 2019-01-29 DIAGNOSIS — E611 Iron deficiency: Secondary | ICD-10-CM

## 2019-01-29 DIAGNOSIS — R7309 Other abnormal glucose: Secondary | ICD-10-CM

## 2019-01-29 MED ORDER — ROSUVASTATIN CALCIUM 5 MG PO TABS: 5.0000 mg | ORAL_TABLET | Freq: Every day | ORAL | 3 refills | Status: DC

## 2019-01-29 NOTE — Patient Instructions (Addendum)
Stop the simvastatin 40 mg and get on the crestor 5 mg daily Will check some labs on you  TUMERIC FOR INFLAMMATION  Tumeric with black pepper extract is a great natural antiinflammatory that helps with arthritis and aches and pain. Can get from [employer] or any health food store. Need to take at least 800mg  twice a day with food TO [place].   Memory Compensation Strategies  1. Use "WARM" strategy.  W= write it down  A= associate it  R= repeat it  M= make a mental note  2.   You can keep a Social worker.  Use a 3-ring notebook with sections for the following: calendar, important names and phone numbers,  medications, doctors' names/phone numbers, lists/reminders, and a section to journal what you did  each day.   3.    Use a calendar to write appointments down.  4.    Write yourself a schedule for the day.  This can be placed on the calendar or in a separate section of the Memory Notebook.  Keeping a  regular schedule can help memory.  5.    Use medication organizer with sections for each day or morning/evening pills.  You may need help loading it  6.    Keep a basket, or pegboard by the door.  Place items that you need to take out with you in the basket or on the pegboard.  You may also want to  include a message board for reminders.  7.    Use sticky notes.  Place sticky notes with reminders in a place where the task is performed.  For example: " turn off the  stove" placed by the stove, "lock the door" placed on the door at eye level, " take your medications" on  the bathroom mirror or by the place where you normally take your medications.  8.    Use alarms/timers.  Use while cooking to remind yourself to check on food or as a reminder to take your medicine, or as a  reminder to make a call, or as a reminder to perform another task, etc.   Ways to prevent diarrhea with magnesium:  1) Don't take all your magnesium at the same time, have 2-3 smaller doses through out the  day 2) Try taking your magnesium with high fiber meals.  3) If this does not help, take the magnesium on an empty stomach. Fiber for some people can bind the magnesium too well and prevent absorption in your gut.  4) Lastly try different types of magnesium. Most people are taking magnesium citrate, you can also try dimalate capsules which are slow release. You can also find magnesium lotions/sprays for the skin that bypass the gut. Another one that has good absorption is ReMag (pico-iconic magnesium formula), this has great cellular absorption so less of a laxative effect. You can find these type at health food stores or online.

## 2019-01-30 LAB — VITAMIN B12: Vitamin B-12: 340 pg/mL (ref 200–1100)

## 2019-01-30 LAB — COMPLETE METABOLIC PANEL WITH GFR
AG RATIO: 1.8 (calc) (ref 1.0–2.5)
ALKALINE PHOSPHATASE (APISO): 57 U/L (ref 37–153)
ALT: 13 U/L (ref 6–29)
AST: 23 U/L (ref 10–35)
Albumin: 4.3 g/dL (ref 3.6–5.1)
BILIRUBIN TOTAL: 0.5 mg/dL (ref 0.2–1.2)
BUN: 23 mg/dL (ref 7–25)
CALCIUM: 10.1 mg/dL (ref 8.6–10.4)
CHLORIDE: 102 mmol/L (ref 98–110)
CO2: 33 mmol/L — ABNORMAL HIGH (ref 20–32)
Creat: 0.86 mg/dL (ref 0.60–0.88)
GFR, EST NON AFRICAN AMERICAN: 62 mL/min/{1.73_m2} (ref >=60)
GFR, Est African American: 72 mL/min/{1.73_m2} (ref >=60)
Globulin: 2.4 g/dL (calc) (ref 1.9–3.7)
Glucose, Bld: 92 mg/dL (ref 65–99)
POTASSIUM: 4.1 mmol/L (ref 3.5–5.3)
SODIUM: 143 mmol/L (ref 135–146)
Total Protein: 6.7 g/dL (ref 6.1–8.1)

## 2019-01-30 LAB — CBC WITH DIFFERENTIAL/PLATELET
ABSOLUTE MONOCYTES: 481 {cells}/uL (ref 200–950)
BASOS PCT: 0.4 %
Basophils Absolute: 22 cells/uL (ref 0–200)
EOS PCT: 2.2 %
Eosinophils Absolute: 119 cells/uL (ref 15–500)
HEMATOCRIT: 41.4 % (ref 35.0–45.0)
Hemoglobin: 13.1 g/dL (ref 11.7–15.5)
LYMPHS ABS: 1415 {cells}/uL (ref 850–3900)
MCH: 27.2 pg (ref 27.0–33.0)
MCHC: 31.6 g/dL — ABNORMAL LOW (ref 32.0–36.0)
MCV: 86.1 fL (ref 80.0–100.0)
MPV: 10.9 fL (ref 7.5–12.5)
Monocytes Relative: 8.9 %
NEUTROS ABS: 3364 {cells}/uL (ref 1500–7800)
Neutrophils Relative %: 62.3 %
Platelets: 188 10*3/uL (ref 140–400)
RBC: 4.81 10*6/uL (ref 3.80–5.10)
RDW: 13.2 % (ref 11.0–15.0)
Total Lymphocyte: 26.2 %
WBC: 5.4 10*3/uL (ref 3.8–10.8)

## 2019-01-30 LAB — MAGNESIUM: Magnesium: 1.5 mg/dL (ref 1.5–2.5)

## 2019-01-30 LAB — LIPID PANEL
Cholesterol: 168 mg/dL (ref <200)
HDL: 63 mg/dL (ref >=50)
LDL Cholesterol (Calc): 90 mg/dL (calc)
Non-HDL Cholesterol (Calc): 105 mg/dL (calc) (ref <130)
Total CHOL/HDL Ratio: 2.7 (calc) (ref <5.0)
Triglycerides: 68 mg/dL (ref <150)

## 2019-01-30 LAB — TSH: TSH: 0.75 m[IU]/L (ref 0.40–4.50)

## 2019-01-30 LAB — IRON, TOTAL/TOTAL IRON BINDING CAP
%SAT: 39 % (ref 16–45)
Iron: 129 ug/dL (ref 45–160)
TIBC: 328 ug/dL (ref 250–450)

## 2019-02-20 ENCOUNTER — Other Ambulatory Visit: Payer: Self-pay | Admitting: Internal Medicine

## 2019-03-12 ENCOUNTER — Ambulatory Visit: Payer: Medicare Other

## 2019-03-18 ENCOUNTER — Other Ambulatory Visit: Payer: Self-pay | Admitting: Internal Medicine

## 2019-03-18 DIAGNOSIS — I10 Essential (primary) hypertension: Secondary | ICD-10-CM

## 2019-03-18 MED ORDER — HYDROCHLOROTHIAZIDE 25 MG PO TABS: ORAL_TABLET | ORAL | 1 refills | Status: DC

## 2019-03-18 MED ORDER — METOPROLOL TARTRATE 50 MG PO TABS: ORAL_TABLET | ORAL | 1 refills | Status: DC

## 2019-03-20 ENCOUNTER — Other Ambulatory Visit: Payer: Self-pay | Admitting: Internal Medicine

## 2019-03-20 DIAGNOSIS — I10 Essential (primary) hypertension: Secondary | ICD-10-CM

## 2019-03-20 MED ORDER — HYDROCHLOROTHIAZIDE 25 MG PO TABS: ORAL_TABLET | ORAL | 3 refills | Status: DC

## 2019-04-10 ENCOUNTER — Other Ambulatory Visit: Payer: Medicare Other

## 2019-04-23 ENCOUNTER — Ambulatory Visit: Payer: Self-pay | Admitting: Internal Medicine

## 2019-05-01 NOTE — Patient Instructions (Signed)

## 2019-05-01 NOTE — Progress Notes (Signed)
THIS ENCOUNTER IS A VIRTUAL VISIT DUE TO COVID-19 - PATIENT WAS NOT SEEN IN THE OFFICE.  PATIENT HAS CONSENTED TO VIRTUAL VISIT / TELEMEDICINE VISIT  This provider placed a call to Penny [name] using telephone, her appointment was changed to a virtual office visit to reduce the risk of exposure to the COVID-19 virus and to help Penny [name] remain healthy and safe. The virtual visit will also provide continuity of care. She verbalizes understanding.   Virtual Visit via telephone Note  I connected with  Penny [name]  on 05/02/19  by telephone.  I verified that I am speaking with the correct person using two identifiers.        I discussed the limitations of evaluation and management by telemedicine and the availability of in person appointments. The patient expressed understanding and agreed to proceed.  History of Present Illness:      This very nice 85 y.o. WWF presents for 6 month follow up with HTN, HLD, Pre-Diabetes and Vitamin D Deficiency.  In 1998, patient underwent RULobectomy for Lung Ca. Patient has hx/o Osteoporosis and has been on Fosamax since 2016 with dexaBMD in  improved to Osteopenia.      Patient is treated for HTN (2000)  & BP has been controlled at home. Today's  . Patient has had no complaints of any cardiac type chest pain, palpitations, dyspnea / orthopnea / PND, dizziness, claudication, or dependent edema.      Hyperlipidemia is controlled with diet & Rosuvastatin. Patient denies myalgias or other med SE's. Last Lipids were at goal: Lab Results  Component Value Date   CHOL 168 01/29/2019   HDL 63 01/29/2019   LDLCALC 90 01/29/2019   TRIG 68 01/29/2019   CHOLHDL 2.7 01/29/2019       Also, the patient has history of PreDiabetes (A1c 5.9% / 2009)  and has had no symptoms of reactive hypoglycemia, diabetic polys, paresthesias or visual blurring.  Last A1c was Normal & at goal: Lab Results  Component Value Date   HGBA1C 5.6 10/09/2018      Further, the patient  also has history of Vitamin D Deficiency ("26" / 2008)  and supplements vitamin D without any suspected side-effects. Last vitamin D was at goal: Lab Results  Component Value Date   VD25OH 95 10/09/2018   Current Outpatient Medications on File Prior to Visit  Medication Sig  . aspirin 81 MG chewable tablet Chew 81 mg by mouth daily.  . diphenhydramine-acetaminophen (TYLENOL PM) 25-500 MG TABS Take 1 tablet by mouth at bedtime. Take every night per patient  . hydrochlorothiazide (HYDRODIURIL) 25 MG tablet Take ONE (1) tablet Daily for BP & Fluid Retention  . metoprolol tartrate (LOPRESSOR) 50 MG tablet Take 1 tablet 2 x /day for BP  . OVER THE COUNTER MEDICATION   . rosuvastatin (CRESTOR) 5 MG tablet Take 1 tablet (5 mg total) by mouth at bedtime.   No current facility-administered medications on file prior to visit.    Allergies  Allergen Reactions  . Ace Inhibitors Cough  . Calcium-Containing Compounds Other (See Comments)    constpation  . Lactose Intolerance (Gi) Diarrhea  . Polysporin [Bacitracin-Polymyxin B] Rash    Polysporin or neosporin gave her a rash, couldn't remember which   PMHx:   Past Medical History:  Diagnosis Date  . COPD (chronic obstructive pulmonary disease) with emphysema ([place])   . Hyperlipidemia   . Hypertension   . Lung cancer, upper lobe ([place]) 12/04/1996  RUL  . Osteoporosis   . S/P partial lobectomy of lung    [id number]  . Thigh shingles 12/04/1996  . Thoracic aorta atherosclerosis ([place])   . Vitamin D deficiency    Immunization History  Administered Date(s) Administered  . Influenza, High Dose Seasonal PF 09/15/2015, 10/02/2017  . Influenza-Unspecified 08/04/2012, 09/03/2013, 09/03/2018  . Pneumococcal Conjugate-13 07/21/2014  . Pneumococcal Polysaccharide-23 10/30/2005  . Td 04/03/2013   Past Surgical History:  Procedure Laterality Date  . CARPAL TUNNEL RELEASE Right    2009  . LUNG REMOVAL, PARTIAL Right    RUL 1998  . OLECRANON  BURSECTOMY Left 03/20/2017   Procedure: EXICISION OLECRANON BURSA LEFT ELBOW;  Surgeon: Daryll Brod, MD;  Location: [facility];  Service: Orthopedics;  Laterality: Left;  with scb block in preop  . SHOULDER ADHESION RELEASE Right    2004   FHx:    Reviewed / unchanged  SHx:    Reviewed / unchanged   Systems Review:  Constitutional: Denies fever, chills, wt changes, headaches, insomnia, fatigue, night sweats, change in appetite. Eyes: Denies redness, blurred vision, diplopia, discharge, itchy, watery eyes.  ENT: Denies discharge, congestion, post nasal drip, epistaxis, sore throat, earache, hearing loss, dental pain, tinnitus, vertigo, sinus pain, snoring.  CV: Denies chest pain, palpitations, irregular heartbeat, syncope, dyspnea, diaphoresis, orthopnea, PND, claudication or edema. Respiratory: denies cough, dyspnea, DOE, pleurisy, hoarseness, laryngitis, wheezing.  Gastrointestinal: Denies dysphagia, odynophagia, heartburn, reflux, water brash, abdominal pain or cramps, nausea, vomiting, bloating, diarrhea, constipation, hematemesis, melena, hematochezia  or hemorrhoids. Genitourinary: Denies dysuria, frequency, urgency, nocturia, hesitancy, discharge, hematuria or flank pain. Musculoskeletal: Denies arthralgias, myalgias, stiffness, jt. swelling, pain, limping or strain/sprain.  Skin: Denies pruritus, rash, hives, warts, acne, eczema or change in skin lesion(s). Neuro: No weakness, tremor, incoordination, spasms, paresthesia or pain. Psychiatric: Denies confusion, memory loss or sensory loss. Endo: Denies change in weight, skin or hair change.  Heme/Lymph: No excessive bleeding, bruising or enlarged lymph nodes.  Physical Exam  ( patient encouraged to purchase a home BP cuff)   General : Well sounding patient in no apparent distress HEENT: no hoarseness, no cough for duration of visit Lungs: speaks in complete sentences, no audible wheezing, no apparent distress  Neurological: alert, oriented x 3 Psychiatric: pleasant, judgement appropriate   Assessment and Plan:  1. Essential hypertension  - Continue medication, monitor blood pressure at home.  - Continue DASH diet.  Reminder to go to the ER if any CP,  SOB, nausea, dizziness, severe HA, changes vision/speech.  2. Hyperlipidemia, mixed  - Continue diet/meds, exercise,& lifestyle modifications.  - Continue monitor periodic cholesterol/liver & renal functions   3. Abnormal glucose  - Continue diet, exercise  - Lifestyle modifications.  - Monitor appropriate labs.  4. Vitamin D deficiency  - Continue supplementation.       Discussed  regular exercise, BP monitoring, weight control to achieve/maintain BMI less than 25 and discussed med and SE's. Deferred  labs  Til next OV. I discussed the assessment and treatment plan with the patient. The patient was provided an opportunity to ask questions and all were answered. The patient agreed with the plan and demonstrated an understanding of the instructions. I provided  21 minutes of non-face-to-face time during this encounter and over 28 minutes of exam, counseling, chart review and  complex critical decision making was performed   Kirtland Bouchard, MD

## 2019-05-02 ENCOUNTER — Encounter: Payer: Self-pay | Admitting: Internal Medicine

## 2019-05-02 ENCOUNTER — Ambulatory Visit: Payer: Self-pay | Admitting: Internal Medicine

## 2019-05-02 ENCOUNTER — Ambulatory Visit: Payer: Medicare Other | Admitting: Internal Medicine

## 2019-05-02 DIAGNOSIS — R7303 Prediabetes: Secondary | ICD-10-CM

## 2019-05-02 DIAGNOSIS — R7309 Other abnormal glucose: Secondary | ICD-10-CM

## 2019-05-02 DIAGNOSIS — E559 Vitamin D deficiency, unspecified: Secondary | ICD-10-CM

## 2019-05-02 DIAGNOSIS — E782 Mixed hyperlipidemia: Secondary | ICD-10-CM | POA: Diagnosis not present

## 2019-05-02 DIAGNOSIS — I10 Essential (primary) hypertension: Secondary | ICD-10-CM | POA: Diagnosis not present

## 2019-05-02 DIAGNOSIS — J301 Allergic rhinitis due to pollen: Secondary | ICD-10-CM

## 2019-05-02 MED ORDER — LEVOCETIRIZINE DIHYDROCHLORIDE 5 MG PO TABS: ORAL_TABLET | ORAL | Status: DC

## 2019-05-02 MED ORDER — CHOLECALCIFEROL 125 MCG (5000 UT) PO CAPS: ORAL_CAPSULE | ORAL | Status: DC

## 2019-05-02 MED ORDER — VITAMIN B-12 1000 MCG SL SUBL: SUBLINGUAL_TABLET | SUBLINGUAL | 0 refills | Status: DC

## 2019-05-02 MED ORDER — MONTELUKAST SODIUM 10 MG PO TABS: ORAL_TABLET | ORAL | 3 refills | Status: DC

## 2019-05-02 MED ORDER — CETIRIZINE HCL 10 MG PO TABS: ORAL_TABLET | ORAL | Status: DC

## 2019-05-05 ENCOUNTER — Other Ambulatory Visit: Payer: Self-pay

## 2019-05-21 ENCOUNTER — Ambulatory Visit
Admission: RE | Admit: 2019-05-21 | Discharge: 2019-05-21 | Disposition: A | Discharge status: Home / Self Care | Payer: Medicare Other | Source: Ambulatory Visit | Attending: Physician Assistant | Admitting: Physician Assistant

## 2019-05-21 ENCOUNTER — Other Ambulatory Visit: Payer: Self-pay

## 2019-05-21 DIAGNOSIS — Z780 Asymptomatic menopausal state: Secondary | ICD-10-CM | POA: Diagnosis not present

## 2019-05-21 DIAGNOSIS — M810 Age-related osteoporosis without current pathological fracture: Secondary | ICD-10-CM

## 2019-05-21 DIAGNOSIS — Z1231 Encounter for screening mammogram for malignant neoplasm of breast: Secondary | ICD-10-CM | POA: Diagnosis not present

## 2019-05-21 DIAGNOSIS — M85851 Other specified disorders of bone density and structure, right thigh: Secondary | ICD-10-CM | POA: Diagnosis not present

## 2019-07-09 ENCOUNTER — Ambulatory Visit: Payer: Self-pay | Admitting: Adult Health

## 2019-07-09 DIAGNOSIS — Z961 Presence of intraocular lens: Secondary | ICD-10-CM | POA: Diagnosis not present

## 2019-08-04 ENCOUNTER — Ambulatory Visit: Payer: Self-pay | Admitting: Physician Assistant

## 2019-08-04 NOTE — Progress Notes (Signed)
FOLLOW UP  Assessment and Plan:   Acute pain of right shoulder -     meloxicam (MOBIC) 15 MG tablet; Take one daily with food for 2 weeks, can take with tylenol, can not take with aleve, iburpofen, then as needed daily for pain  Hypertension Well controlled with current medications  Monitor blood pressure at home; patient to call if consistently greater than 130/80 Continue DASH diet.   Reminder to go to the ER if any CP, SOB, nausea, dizziness, severe HA, changes vision/speech, left arm numbness and tingling and jaw pain.  Cholesterol Continue medication  Continue low cholesterol diet and exercise.  Check lipid panel.   Prediabetes Discussed disease and risks Discussed diet/exercise, weight management A1C  Vitamin D Def/ osteoporosis prevention Continue supplementation Check Vit D level  COPD/emphysema Stable without treatment; continue to monitor   Atherosclerosis of aorta Control blood pressure, cholesterol, glucose, increase exercise.    Continue diet and meds as discussed. Further disposition pending results of labs. Discussed med's effects and SE's.   Over 30 minutes of exam, counseling, chart review, and critical decision making was performed.   Future Appointments  Date Time Provider [place]  11/11/2019 11:00 AM Unk Pinto, MD [facility] None  02/10/2020 11:15 AM Vicie Mutters, PA-C [facility] None    ----------------------------------------------------------------------------------------------------------------------  HPI 85 y.o. female with hx of RUL lung CA s/p partial lobectomy in 1998; presents for 3 month follow up on COPD/emphysema, hypertension, cholesterol, prediabetes and vitamin D deficiency.   States she tripped several weeks ago, mechanical fall from the concrete, tripped and fell on her nose and bilateral knees. States her nose bleed but it is better at this time. Bilateral knees with some pain and right shoulder with pain with  external rotation and abduction.   COPD/emphysema noted on CT chest 2016 is currently stable without treatment. Some dyspnea with rushing or exertion.   Her blood pressure has been controlled at home, today their BP is BP: 118/62  She does workout. She denies chest pain, dizziness. BMI is Body mass index is 27.33 kg/m., she is working on diet and exercise. Wt Readings from Last 3 Encounters:  08/06/19 149 lb 6.4 oz (67.8 kg)  01/29/19 143 lb 3.2 oz (65 kg)  10/09/18 143 lb 9.6 oz (65.1 kg)    She is on cholesterol medication and denies myalgias. Her cholesterol is at goal. The cholesterol last visit was:   Lab Results  Component Value Date   CHOL 168 01/29/2019   HDL 63 01/29/2019   LDLCALC 90 01/29/2019   TRIG 68 01/29/2019   CHOLHDL 2.7 01/29/2019    She has been working on diet and exercise for prediabetes, and denies nausea, paresthesia of the feet, polydipsia, polyuria, visual disturbances and vomiting. Last A1C in the office was:  Lab Results  Component Value Date   HGBA1C 5.6 10/09/2018   Patient is on Vitamin D supplement and at goal:    Lab Results  Component Value Date   VD25OH 95 10/09/2018       Current Medications:  Current Outpatient Medications on File Prior to Visit  Medication Sig  . aspirin 81 MG chewable tablet Chew 81 mg by mouth daily.  . cetirizine (ZYRTEC) 10 MG tablet Takes 1 tablet Daily  . Cholecalciferol 125 MCG (5000 UT) capsule Takes 1 cap 3 x /week MWF and 2 caps 4 x /wk TThSS  . Cyanocobalamin (VITAMIN B-12) 1000 MCG SUBL Takes 1 tab SL Daily  . diphenhydramine-acetaminophen (TYLENOL PM)  25-500 MG TABS Take 1 tablet by mouth at bedtime. Take every night per patient  . hydrochlorothiazide (HYDRODIURIL) 25 MG tablet Take ONE (1) tablet Daily for BP & Fluid Retention  . levocetirizine (XYZAL) 5 MG tablet Takes 1 tablet Daily  . metoprolol tartrate (LOPRESSOR) 50 MG tablet Take 1 tablet 2 x /day for BP  . montelukast (SINGULAIR) 10 MG tablet  Take 1 tablet daily for Allergies  . OVER THE COUNTER MEDICATION   . rosuvastatin (CRESTOR) 5 MG tablet Take 1 tablet (5 mg total) by mouth at bedtime.   No current facility-administered medications on file prior to visit.      Allergies:  Allergies  Allergen Reactions  . Ace Inhibitors Cough  . Calcium-Containing Compounds Other (See Comments)    constpation  . Lactose Intolerance (Gi) Diarrhea  . Polysporin [Bacitracin-Polymyxin B] Rash    Polysporin or neosporin gave her a rash, couldn't remember which     Medical History:  Past Medical History:  Diagnosis Date  . COPD (chronic obstructive pulmonary disease) with emphysema ([place])   . Hyperlipidemia   . Hypertension   . Lung cancer, upper lobe ([place]) 12/04/1996   RUL  . Osteoporosis   . S/P partial lobectomy of lung    [id number]  . Thigh shingles 12/04/1996  . Thoracic aorta atherosclerosis ([place])   . Vitamin D deficiency    Family history- Reviewed and unchanged Social history- Reviewed and unchanged   Review of Systems:  Review of Systems  Constitutional: Negative for malaise/fatigue and weight loss.  HENT: Negative for hearing loss and tinnitus.   Eyes: Negative for blurred vision and double vision.  Respiratory: Negative for cough, shortness of breath and wheezing.   Cardiovascular: Negative for chest pain, palpitations, orthopnea, claudication and leg swelling.  Gastrointestinal: Negative for abdominal pain, blood in stool, constipation, diarrhea, heartburn, melena, nausea and vomiting.  Genitourinary: Negative.   Musculoskeletal: Positive for falls and joint pain. Negative for myalgias.  Skin: Negative for rash.  Neurological: Negative for dizziness, tingling, sensory change, weakness and headaches.  Endo/Heme/Allergies: Negative for polydipsia.  Psychiatric/Behavioral: Negative.   All other systems reviewed and are negative.   Physical Exam: BP 118/62   Pulse 74   Temp 97.7 F (36.5 C)   Ht 5\' 2"  (1.575  m)   Wt 149 lb 6.4 oz (67.8 kg)   SpO2 96%   BMI 27.33 kg/m  Wt Readings from Last 3 Encounters:  08/06/19 149 lb 6.4 oz (67.8 kg)  01/29/19 143 lb 3.2 oz (65 kg)  10/09/18 143 lb 9.6 oz (65.1 kg)   General Appearance: Well nourished, in no apparent distress. Eyes: PERRLA, EOMs, conjunctiva no swelling or erythema Sinuses: No Frontal/maxillary tenderness ENT/Mouth: Ext aud canals clear, TMs without erythema, bulging. No erythema, swelling, or exudate on post pharynx.  Tonsils not swollen or erythematous. Hearing normal.  Neck: Supple, thyroid normal.  Respiratory: Respiratory effort normal, BS equal bilaterally without rales, rhonchi, wheezing or stridor.  Cardio: RRR with no MRGs. Brisk peripheral pulses without edema.  Abdomen: Soft, + BS.  Non tender, no guarding, rebound, hernias, masses. Lymphatics: Non tender without lymphadenopathy.  Musculoskeletal: Full ROM, 5/5 strength, Normal gait. Right bicipital groove tenderness, pain with external rotation of right shoulder, pain with abduction to 90 degrees, full abduction to 180 with some pain, + yergason test, + speed test. Normal strength bilateral upper extremities, normal distal neurovascular exam.  Skin: Warm, dry without rashes, lesions, ecchymosis.  Neuro: Cranial nerves intact. No  cerebellar symptoms.  Psych: Awake and oriented X 3, normal affect, Insight and Judgment appropriate.    Vicie Mutters, PA-C 12:02 PM [facility]

## 2019-08-06 ENCOUNTER — Encounter: Payer: Self-pay | Admitting: Physician Assistant

## 2019-08-06 ENCOUNTER — Ambulatory Visit (INDEPENDENT_AMBULATORY_CARE_PROVIDER_SITE_OTHER): Payer: Medicare Other | Admitting: Physician Assistant

## 2019-08-06 ENCOUNTER — Other Ambulatory Visit: Payer: Self-pay | Admitting: Physician Assistant

## 2019-08-06 ENCOUNTER — Other Ambulatory Visit: Payer: Self-pay

## 2019-08-06 VITALS — BP 118/62 | HR 74 | Temp 97.7°F | Ht 62.0 in | Wt 149.4 lb

## 2019-08-06 DIAGNOSIS — J439 Emphysema, unspecified: Secondary | ICD-10-CM

## 2019-08-06 DIAGNOSIS — M25511 Pain in right shoulder: Secondary | ICD-10-CM

## 2019-08-06 DIAGNOSIS — Z79899 Other long term (current) drug therapy: Secondary | ICD-10-CM

## 2019-08-06 DIAGNOSIS — E559 Vitamin D deficiency, unspecified: Secondary | ICD-10-CM

## 2019-08-06 DIAGNOSIS — R7309 Other abnormal glucose: Secondary | ICD-10-CM | POA: Diagnosis not present

## 2019-08-06 DIAGNOSIS — E782 Mixed hyperlipidemia: Secondary | ICD-10-CM

## 2019-08-06 DIAGNOSIS — I700 Atherosclerosis of aorta: Secondary | ICD-10-CM | POA: Diagnosis not present

## 2019-08-06 DIAGNOSIS — I10 Essential (primary) hypertension: Secondary | ICD-10-CM

## 2019-08-06 MED ORDER — MELOXICAM 15 MG PO TABS: ORAL_TABLET | ORAL | 1 refills | Status: DC

## 2019-08-06 NOTE — Patient Instructions (Signed)
Mobic is an antiinflammatory It helps pain, can not take with aleve, or ibuprofen You can take tylenol (500mg ) or tylenol arthritis (650mg ) with the meloxicam/antiinflammatories. The max you can take of tylenol a day is 3000mg  daily, this is a max of 6 pills a day of the regular tyelnol (500mg ) or a max of 4 a day of the tylenol arthritis (650mg ) as long as no other medications you are taking contain tylenol.   Mobic can cause inflammation in your stomach and can cause ulcers or bleeding, this will look like black tarry stools Make sure you take your mobic with food Try not to take it daily, take AS needed Can take with pepcid  MAXIMUM AMOUNT OF TYLENOL IN A DAY  You can take tylenol (500mg ) or tylenol arthritis (650mg ). The max you can take of tylenol a day is 3000mg  daily This is a max of 6 pills a day of the regular tyelnol (500mg )  Or a max of 4 a day of the tylenol arthritis (650mg ) as long as no other medications you are taking contain tylenol.   Rest with ice or heat for 2-3 days then you can start the range of motion exercises below  We can refer to physical therapy if you want or we can refer you to ortho for evaluation   Biceps Tendon Tendinitis (Proximal) and Tenosynovitis Rehab Ask your health care provider which exercises are safe for you. Do exercises exactly as told by your health care provider and adjust them as directed. It is normal to feel mild stretching, pulling, tightness, or discomfort as you do these exercises, but you should stop right away if you feel sudden pain or your pain gets worse. Do not begin these exercises until told by your health care provider. Stretching and range of motion exercises These exercises warm up your muscles and joints and improve the movement and flexibility of your arm and shoulder. These exercises also help to relieve pain and stiffness. Exercise A: Shoulder flexion   1. Stand facing a wall. Put your left / right hand on the wall. 2.  Slide your left / right hand up the wall. Stop when you feel a stretch in your shoulder, or when you reach the angle that is recommended by your health care provider. ? Use your other hand to help raise your arm, if needed. ? As your hand gets higher, you may need to step closer to the wall. ? Avoid shrugging your shoulder while you raise your arm. To do this, keep your shoulder blade tucked down toward your spine. 3. Hold for __________ seconds. 4. Slowly return to the starting position. Use your other arm to help, if needed. Repeat __________ times. Complete this exercise __________ times a day. Exercise B: Posterior capsule stretch (passive horizontal adduction)   1. Sit or stand and pull your left / right elbow across your chest, toward your other shoulder. Stop when you feel a gentle stretch in the back of your shoulder and upper arm. ? Keep your arm at shoulder height. ? Keep your arm as close to your body as you comfortably can. 2. Hold for __________ seconds. 3. Slowly return to the starting position. Repeat __________ times. Complete this exercise __________ times a day. Strengthening exercises These exercises build strength and endurance in your arm and shoulder. Endurance is the ability to use your muscles for a long time, even after your muscles get tired. Exercise C: Elbow flexion, supinated   1. Sit on a stable  chair without armrests, or stand. 2. If directed, hold a __________ weight in your left / right hand, or hold an exercise band with both hands. Your palms should face up toward the ceiling at the starting position. 3. Bend your left / right elbow and move your hand up toward your shoulder. Keep your other arm straight down, in the starting position. 4. Slowly return to the starting position. Repeat __________ times. Complete this exercise __________ times a day. Exercise D: Scapular protraction, supine   1. Lie on your back on a firm surface. If directed, hold a  __________ weight in your left / right hand. 2. Raise your left / right arm straight into the air so your hand is directly above your shoulder joint. 3. Push the weight into the air so your shoulder lifts off of the surface that you are lying on. Do not move your head, neck, or back. 4. Hold for __________ seconds. 5. Slowly return to the starting position. Let your muscles relax completely before you repeat this exercise. Repeat __________ times. Complete this exercise __________ times a day. Exercise E: Scapular retraction   1. Sit in a stable chair without armrests, or stand. 2. Secure an exercise band to a stable object in front of you so the band is at shoulder height. 3. Hold one end of the exercise band in each hand. 4. Squeeze your shoulder blades together and move your elbows slightly behind you. Do not shrug your shoulders. 5. Hold for __________ seconds. 6. Slowly return to the starting position. Repeat __________ times. Complete this exercise __________ times a day. This information is not intended to replace advice given to you by your health care provider. Make sure you discuss any questions you have with your health care provider. Document Released: 11/20/2005 Document Revised: 07/27/2016 Document Reviewed: 10/29/2015 Elsevier Interactive Patient Education  2019 [employer].

## 2019-08-07 LAB — CBC WITH DIFFERENTIAL/PLATELET
Absolute Monocytes: 525 cells/uL (ref 200–950)
Basophils Absolute: 20 cells/uL (ref 0–200)
Basophils Relative: 0.4 %
Eosinophils Absolute: 112 cells/uL (ref 15–500)
Eosinophils Relative: 2.2 %
HCT: 39.4 % (ref 35.0–45.0)
Hemoglobin: 12.6 g/dL (ref 11.7–15.5)
Lymphs Abs: 1403 cells/uL (ref 850–3900)
MCH: 27.9 pg (ref 27.0–33.0)
MCHC: 32 g/dL (ref 32.0–36.0)
MCV: 87.2 fL (ref 80.0–100.0)
MPV: 11.1 fL (ref 7.5–12.5)
Monocytes Relative: 10.3 %
Neutro Abs: 3040 cells/uL (ref 1500–7800)
Neutrophils Relative %: 59.6 %
Platelets: 179 10*3/uL (ref 140–400)
RBC: 4.52 10*6/uL (ref 3.80–5.10)
RDW: 12.6 % (ref 11.0–15.0)
Total Lymphocyte: 27.5 %
WBC: 5.1 10*3/uL (ref 3.8–10.8)

## 2019-08-07 LAB — COMPLETE METABOLIC PANEL WITH GFR
AG Ratio: 1.6 (calc) (ref 1.0–2.5)
ALT: 11 U/L (ref 6–29)
AST: 17 U/L (ref 10–35)
Albumin: 4 g/dL (ref 3.6–5.1)
Alkaline phosphatase (APISO): 57 U/L (ref 37–153)
BUN/Creatinine Ratio: 25 (calc) — ABNORMAL HIGH (ref 6–22)
BUN: 24 mg/dL (ref 7–25)
CO2: 34 mmol/L — ABNORMAL HIGH (ref 20–32)
Calcium: 9.6 mg/dL (ref 8.6–10.4)
Chloride: 102 mmol/L (ref 98–110)
Creat: 0.95 mg/dL — ABNORMAL HIGH (ref 0.60–0.88)
GFR, Est African American: 64 mL/min/{1.73_m2} (ref >=60)
GFR, Est Non African American: 55 mL/min/{1.73_m2} — ABNORMAL LOW (ref >=60)
Globulin: 2.5 g/dL (calc) (ref 1.9–3.7)
Glucose, Bld: 112 mg/dL — ABNORMAL HIGH (ref 65–99)
Potassium: 3.2 mmol/L — ABNORMAL LOW (ref 3.5–5.3)
Sodium: 143 mmol/L (ref 135–146)
Total Bilirubin: 0.3 mg/dL (ref 0.2–1.2)
Total Protein: 6.5 g/dL (ref 6.1–8.1)

## 2019-08-07 LAB — LIPID PANEL
Cholesterol: 159 mg/dL (ref <200)
HDL: 61 mg/dL (ref >=50)
LDL Cholesterol (Calc): 81 mg/dL (calc)
Non-HDL Cholesterol (Calc): 98 mg/dL (calc) (ref <130)
Total CHOL/HDL Ratio: 2.6 (calc) (ref <5.0)
Triglycerides: 90 mg/dL (ref <150)

## 2019-08-07 LAB — HEMOGLOBIN A1C
Hgb A1c MFr Bld: 5.9 % of total Hgb — ABNORMAL HIGH (ref <5.7)
Mean Plasma Glucose: 123 (calc)
eAG (mmol/L): 6.8 (calc)

## 2019-08-07 LAB — MAGNESIUM: Magnesium: 1.3 mg/dL — ABNORMAL LOW (ref 1.5–2.5)

## 2019-08-07 LAB — TSH: TSH: 1.33 mIU/L (ref 0.40–4.50)

## 2019-09-01 NOTE — Progress Notes (Deleted)
  Assessment and Plan:    HPI 85 y.o.female presents for follow up for her shoulder and low potassium/magnesium.   Past Medical History:  Diagnosis Date  . COPD (chronic obstructive pulmonary disease) with emphysema ([place])   . Hyperlipidemia   . Hypertension   . Lung cancer, upper lobe ([place]) 12/04/1996   RUL  . Osteoporosis   . S/P partial lobectomy of lung    [id number]  . Thigh shingles 12/04/1996  . Thoracic aorta atherosclerosis ([place])   . Vitamin D deficiency      Allergies  Allergen Reactions  . Ace Inhibitors Cough  . Calcium-Containing Compounds Other (See Comments)    constpation  . Lactose Intolerance (Gi) Diarrhea  . Polysporin [Bacitracin-Polymyxin B] Rash    Polysporin or neosporin gave her a rash, couldn't remember which    Current Outpatient Medications on File Prior to Visit  Medication Sig  . aspirin 81 MG chewable tablet Chew 81 mg by mouth daily.  . cetirizine (ZYRTEC) 10 MG tablet Takes 1 tablet Daily  . Cholecalciferol 125 MCG (5000 UT) capsule Takes 1 cap 3 x /week MWF and 2 caps 4 x /wk TThSS  . Cyanocobalamin (VITAMIN B-12) 1000 MCG SUBL Takes 1 tab SL Daily  . diphenhydramine-acetaminophen (TYLENOL PM) 25-500 MG TABS Take 1 tablet by mouth at bedtime. Take every night per patient  . hydrochlorothiazide (HYDRODIURIL) 25 MG tablet Take ONE (1) tablet Daily for BP & Fluid Retention  . levocetirizine (XYZAL) 5 MG tablet Takes 1 tablet Daily  . meloxicam (MOBIC) 15 MG tablet TAKE 1 TABLET BY MOUTH DAILY WITH FOOD FOR 2 WEEKS, CAN TAKE WITH TYLENOL, CAN NOT TAKE WITH ALEVE, IBUPROFEN, THEN AS NEEDED DAILY FOR PAIN  . metoprolol tartrate (LOPRESSOR) 50 MG tablet Take 1 tablet 2 x /day for BP  . montelukast (SINGULAIR) 10 MG tablet Take 1 tablet daily for Allergies  . OVER THE COUNTER MEDICATION   . rosuvastatin (CRESTOR) 5 MG tablet Take 1 tablet (5 mg total) by mouth at bedtime.   No current facility-administered medications on file prior to visit.      ROS: all negative except above.   Physical Exam: There were no vitals filed for this visit. There were no vitals taken for this visit. General Appearance: Well nourished, in no apparent distress. Eyes: PERRLA, EOMs, conjunctiva no swelling or erythema Sinuses: No Frontal/maxillary tenderness ENT/Mouth: Ext aud canals clear, TMs without erythema, bulging. No erythema, swelling, or exudate on post pharynx.  Tonsils not swollen or erythematous. Hearing normal.  Neck: Supple, thyroid normal.  Respiratory: Respiratory effort normal, BS equal bilaterally without rales, rhonchi, wheezing or stridor.  Cardio: RRR with no MRGs. Brisk peripheral pulses without edema.  Abdomen: Soft, + BS.  Non tender, no guarding, rebound, hernias, masses. Lymphatics: Non tender without lymphadenopathy.  Musculoskeletal: Full ROM, 5/5 strength, normal gait.  Skin: Warm, dry without rashes, lesions, ecchymosis.  Neuro: Cranial nerves intact. Normal muscle tone, no cerebellar symptoms. Sensation intact.  Psych: Awake and oriented X 3, normal affect, Insight and Judgment appropriate.     Vicie Mutters, PA-C 8:31 AM [facility]

## 2019-09-03 ENCOUNTER — Ambulatory Visit: Payer: Self-pay | Admitting: Physician Assistant

## 2019-09-15 NOTE — Progress Notes (Signed)
Assessment and Plan:  Acute pain of right shoulder Resolved, has improved  Hypokalemia -     CBC with Differential/Platelet -     COMPLETE METABOLIC PANEL WITH GFR - recheck, add on lite salt  Hypomagnesemia -     Magnesium  Vitamin D deficiency -     VITAMIN D 25 Hydroxy (Vit-D Deficiency, Fractures) Continue supplement- increase to 70,000 a week   HPI 85 y.o.female presents for follow up for abnormal kidney function.  She sold her house of 44 years, moved her things into 4 storage pods, living with her son Sonia Side, very stressed with this move and trying to find a condo to live in. She have been having nausea mainly in the morning, no vomiting with it, some burping/bletching, no black stool, no blood in stool. She is feeling better now, just flashes of nausea.    Her potassium and magnesium was low last visit, she is on HCTZ. She could not tolerate the magnesium due to diarrhea.  Lab Results  Component Value Date   CREATININE 0.95 (H) 08/06/2019   BUN 24 08/06/2019   NA 143 08/06/2019   K 3.2 (L) 08/06/2019   CL 102 08/06/2019   CO2 34 (H) 08/06/2019     Patient Active Problem List   Diagnosis Date Noted  . Abnormal glucose 10/09/2018  . Osteoporosis 12/28/2015  . COPD (chronic obstructive pulmonary disease) with emphysema ([place]) 02/25/2015  . Atherosclerosis of aorta ([place]) 02/25/2015  . Essential hypertension   . Hyperlipidemia, mixed   . Vitamin D deficiency   . Personal history of lung cancer 12/04/1996      Current Outpatient Medications (Cardiovascular):  .  hydrochlorothiazide (HYDRODIURIL) 25 MG tablet, Take ONE (1) tablet Daily for BP & Fluid Retention .  metoprolol tartrate (LOPRESSOR) 50 MG tablet, Take 1 tablet 2 x /day for BP .  rosuvastatin (CRESTOR) 5 MG tablet, Take 1 tablet (5 mg total) by mouth at bedtime.  Current Outpatient Medications (Respiratory):  .  cetirizine (ZYRTEC) 10 MG tablet, Takes 1 tablet Daily .  levocetirizine (XYZAL) 5 MG  tablet, Takes 1 tablet Daily .  montelukast (SINGULAIR) 10 MG tablet, Take 1 tablet daily for Allergies  Current Outpatient Medications (Analgesics):  .  aspirin 81 MG chewable tablet, Chew 81 mg by mouth daily. .  meloxicam (MOBIC) 15 MG tablet, TAKE 1 TABLET BY MOUTH DAILY WITH FOOD FOR 2 WEEKS, CAN TAKE WITH TYLENOL, CAN NOT TAKE WITH ALEVE, IBUPROFEN, THEN AS NEEDED DAILY FOR PAIN  Current Outpatient Medications (Hematological):  Marland Kitchen  Cyanocobalamin (VITAMIN B-12) 1000 MCG SUBL, Takes 1 tab SL Daily  Current Outpatient Medications (Other):  Marland Kitchen  Cholecalciferol 125 MCG (5000 UT) capsule, Takes 1 cap 3 x /week MWF and 2 caps 4 x /wk TThSS .  diphenhydramine-acetaminophen (TYLENOL PM) 25-500 MG TABS, Take 1 tablet by mouth at bedtime. Take every night per patient .  OVER THE COUNTER MEDICATION,   Allergies  Allergen Reactions  . Ace Inhibitors Cough  . Calcium-Containing Compounds Other (See Comments)    constpation  . Lactose Intolerance (Gi) Diarrhea  . Polysporin [Bacitracin-Polymyxin B] Rash    Polysporin or neosporin gave her a rash, couldn't remember which    ROS: all negative except above.   Physical Exam: There were no vitals filed for this visit. There were no vitals taken for this visit. General Appearance: Well nourished, in no apparent distress. Eyes: PERRLA, EOMs, conjunctiva no swelling or erythema Sinuses: No Frontal/maxillary tenderness ENT/Mouth: Ext  aud canals clear, TMs without erythema, bulging. No erythema, swelling, or exudate on post pharynx.  Tonsils not swollen or erythematous. Hearing normal.  Neck: Supple, thyroid normal.  Respiratory: Respiratory effort normal, BS equal bilaterally without rales, rhonchi, wheezing or stridor.  Cardio: RRR with no MRGs. Brisk peripheral pulses without edema.  Abdomen: Soft, + BS.  Non tender, no guarding, rebound, hernias, masses. Lymphatics: Non tender without lymphadenopathy.  Musculoskeletal: Full ROM, 5/5 strength,  normal gait.  Skin: Warm, dry without rashes, lesions, ecchymosis.  Neuro: Cranial nerves intact. Normal muscle tone, no cerebellar symptoms. Sensation intact.  Psych: Awake and oriented X 3, normal affect, Insight and Judgment appropriate.     Vicie Mutters, PA-C 12:05 PM [facility]

## 2019-09-17 ENCOUNTER — Ambulatory Visit (INDEPENDENT_AMBULATORY_CARE_PROVIDER_SITE_OTHER): Payer: Medicare Other | Admitting: Physician Assistant

## 2019-09-17 ENCOUNTER — Other Ambulatory Visit: Payer: Self-pay

## 2019-09-17 VITALS — BP 128/82 | HR 64 | Temp 97.2°F | Resp 16 | Ht 62.0 in | Wt 147.6 lb

## 2019-09-17 DIAGNOSIS — E559 Vitamin D deficiency, unspecified: Secondary | ICD-10-CM | POA: Diagnosis not present

## 2019-09-17 DIAGNOSIS — E876 Hypokalemia: Secondary | ICD-10-CM | POA: Diagnosis not present

## 2019-09-17 DIAGNOSIS — M25511 Pain in right shoulder: Secondary | ICD-10-CM

## 2019-09-17 NOTE — Patient Instructions (Addendum)
Ways to prevent diarrhea with magnesium:  1) Don't take all your magnesium at the same time, have 2-3 smaller doses through out the day 2) Try taking your magnesium with high fiber meals.  3) If this does not help, take the magnesium on an empty stomach. Fiber for some people can bind the magnesium too well and prevent absorption in your gut.  4) Lastly try different types of magnesium. Most people are taking magnesium citrate, you can also try dimalate capsules which are slow release. You can also find magnesium lotions/sprays for the skin that bypass the gut. Another one that has good absorption is ReMag (pico-iconic magnesium formula), this has great cellular absorption so less of a laxative effect. You can find these type at health food stores or online.     Your potassium is low. The body needs potassium to control blood pressure and to keep the muscles and nervous system healthy. You can get the white label salt called "NO SALT" salt substitute, this has potassium and sodium in it, 1/4 teaspoon of this is equivalent to 57meq potassium. I will also provide you with a list of foods high in potassium you can eat.    Hypokalemia Hypokalemia means that the amount of potassium in the blood is lower than normal. Potassium is a chemical (electrolyte) that helps regulate the amount of fluid in the body. It also stimulates muscle tightening (contraction) and helps nerves work properly. Normally, most of the body's potassium is inside cells, and only a very [place] amount is in the blood. Because the amount in the blood is so [place], minor changes to potassium levels in the blood can be life-threatening. What are the causes? This condition may be caused by:  Antibiotic medicine.  Diarrhea or vomiting. Taking too much of a medicine that helps you have a bowel movement (laxative) can cause diarrhea and lead to hypokalemia.  Chronic kidney disease (CKD).  Medicines that help the body get rid of excess  fluid (diuretics).  Eating disorders, such as bulimia.  Low magnesium levels in the body.  Sweating a lot. What are the signs or symptoms? Symptoms of this condition include:  Weakness.  Constipation.  Fatigue.  Muscle cramps.  Mental confusion.  Skipped heartbeats or irregular heartbeat (palpitations).  Tingling or numbness. How is this diagnosed? This condition is diagnosed with a blood test. How is this treated? This condition may be treated by:  Taking potassium supplements by mouth.  Adjusting the medicines that you take.  Eating more foods that contain a lot of potassium. If your potassium level is very low, you may need to get potassium through an IV and be monitored in the hospital. Follow these instructions at home:   Take over-the-counter and prescription medicines only as told by your health care provider. This includes vitamins and supplements.  Eat a healthy diet. A healthy diet includes fresh fruits and vegetables, whole grains, healthy fats, and lean proteins.  If instructed, eat more foods that contain a lot of potassium. This includes: ? Nuts, such as peanuts and pistachios. ? Seeds, such as sunflower seeds and pumpkin seeds. ? Peas, lentils, and lima beans. ? Whole grain and bran cereals and breads. ? Fresh fruits and vegetables, such as apricots, avocado, bananas, cantaloupe, kiwi, oranges, tomatoes, asparagus, and potatoes. ? Orange juice. ? Tomato juice. ? Red meats. ? Yogurt.  Keep all follow-up visits as told by your health care provider. This is important. Contact a health care provider if you:  Have weakness that gets worse.  Feel your heart pounding or racing.  Vomit.  Have diarrhea.  Have diabetes (diabetes mellitus) and you have trouble keeping your blood sugar (glucose) in your target range. Get help right away if you:  Have chest pain.  Have shortness of breath.  Have vomiting or diarrhea that lasts for more than 2  days.  Faint. Summary  Hypokalemia means that the amount of potassium in the blood is lower than normal.  This condition is diagnosed with a blood test.  Hypokalemia may be treated by taking potassium supplements, adjusting the medicines that you take, or eating more foods that are high in potassium.  If your potassium level is very low, you may need to get potassium through an IV and be monitored in the hospital. This information is not intended to replace advice given to you by your health care provider. Make sure you discuss any questions you have with your health care provider. Document Released: 11/20/2005 Document Revised: 07/03/2018 Document Reviewed: 07/03/2018 Elsevier Patient Education  2020 [employer].

## 2019-09-18 LAB — COMPLETE METABOLIC PANEL WITH GFR
AG Ratio: 1.5 (calc) (ref 1.0–2.5)
ALT: 13 U/L (ref 6–29)
AST: 21 U/L (ref 10–35)
Albumin: 4 g/dL (ref 3.6–5.1)
Alkaline phosphatase (APISO): 62 U/L (ref 37–153)
BUN: 18 mg/dL (ref 7–25)
CO2: 34 mmol/L — ABNORMAL HIGH (ref 20–32)
Calcium: 9.4 mg/dL (ref 8.6–10.4)
Chloride: 102 mmol/L (ref 98–110)
Creat: 0.83 mg/dL (ref 0.60–0.88)
GFR, Est African American: 75 mL/min/{1.73_m2} (ref >=60)
GFR, Est Non African American: 65 mL/min/{1.73_m2} (ref >=60)
Globulin: 2.7 g/dL (calc) (ref 1.9–3.7)
Glucose, Bld: 99 mg/dL (ref 65–99)
Potassium: 3.6 mmol/L (ref 3.5–5.3)
Sodium: 143 mmol/L (ref 135–146)
Total Bilirubin: 0.5 mg/dL (ref 0.2–1.2)
Total Protein: 6.7 g/dL (ref 6.1–8.1)

## 2019-09-18 LAB — CBC WITH DIFFERENTIAL/PLATELET
Absolute Monocytes: 424 cells/uL (ref 200–950)
Basophils Absolute: 32 cells/uL (ref 0–200)
Basophils Relative: 0.6 %
Eosinophils Absolute: 154 cells/uL (ref 15–500)
Eosinophils Relative: 2.9 %
HCT: 41.4 % (ref 35.0–45.0)
Hemoglobin: 13.3 g/dL (ref 11.7–15.5)
Lymphs Abs: 1362 cells/uL (ref 850–3900)
MCH: 28 pg (ref 27.0–33.0)
MCHC: 32.1 g/dL (ref 32.0–36.0)
MCV: 87.2 fL (ref 80.0–100.0)
MPV: 10.7 fL (ref 7.5–12.5)
Monocytes Relative: 8 %
Neutro Abs: 3328 cells/uL (ref 1500–7800)
Neutrophils Relative %: 62.8 %
Platelets: 168 10*3/uL (ref 140–400)
RBC: 4.75 10*6/uL (ref 3.80–5.10)
RDW: 12.6 % (ref 11.0–15.0)
Total Lymphocyte: 25.7 %
WBC: 5.3 10*3/uL (ref 3.8–10.8)

## 2019-09-18 LAB — MAGNESIUM: Magnesium: 1.4 mg/dL — ABNORMAL LOW (ref 1.5–2.5)

## 2019-09-18 LAB — VITAMIN D 25 HYDROXY (VIT D DEFICIENCY, FRACTURES): Vit D, 25-Hydroxy: 103 ng/mL — ABNORMAL HIGH (ref 30–100)

## 2019-10-28 ENCOUNTER — Encounter: Payer: Self-pay | Admitting: Internal Medicine

## 2019-11-04 ENCOUNTER — Encounter: Payer: Self-pay | Admitting: Internal Medicine

## 2019-11-10 ENCOUNTER — Encounter: Payer: Self-pay | Admitting: Internal Medicine

## 2019-11-10 NOTE — Patient Instructions (Signed)
Vit D  & Vit C 1,000 mg   are recommended to help protect  against the Covid-19 and other Corona viruses.    Also it's recommended  to take  Zinc 50 mg  to help  protect against the Covid-19   and best place to get  is also on [email or web address]  and don't pay more than 6-8 cents /pill !  ================================ Coronavirus (COVID-19) Are you at risk?  Are you at risk for the Coronavirus (COVID-19)?  To be considered HIGH RISK for Coronavirus (COVID-19), you have to meet the following criteria:  . Traveled to [place], [place], [place], [place] or [place]; or in the [place] to [place], [place], [place]  . or [place]; and have fever, cough, and shortness of breath within the last 2 weeks of travel OR . Been in close contact with a person diagnosed with COVID-19 within the last 2 weeks and have  . fever, cough,and shortness of breath .  . IF YOU DO NOT MEET THESE CRITERIA, YOU ARE CONSIDERED LOW RISK FOR COVID-19.  What to do if you are HIGH RISK for COVID-19?  Marland Kitchen If you are having a medical emergency, call 911. . Seek medical care right away. Before you go to a doctor's office, urgent care or emergency department, .  call ahead and tell them about your recent travel, contact with someone diagnosed with COVID-19  .  and your symptoms.  . You should receive instructions from your physician's office regarding next steps of care.  . When you arrive at healthcare provider, tell the healthcare staff immediately you have returned from  . visiting [place], [place], [place], [place] or [place]; or traveled in the [place] to [place], [place],  . [place] or [place] in the last two weeks or you have been in close contact with a person diagnosed with  . COVID-19 in the last 2 weeks.   . Tell the health care staff about your symptoms: fever, cough and shortness of breath. . After you have been seen by a medical provider, you will be either: o Tested for (COVID-19) and  discharged home on quarantine except to seek medical care if  o symptoms worsen, and asked to  - Stay home and avoid contact with others until you get your results (4-5 days)  - Avoid travel on public transportation if possible (such as bus, train, or airplane) or o Sent to the Emergency Department by EMS for evaluation, COVID-19 testing  and  o possible admission depending on your condition and test results.  What to do if you are LOW RISK for COVID-19?  Reduce your risk of any infection by using the same precautions used for avoiding the common cold or flu:  Marland Kitchen Wash your hands often with soap and warm water for at least 20 seconds.  If soap and water are not readily available,  . use an alcohol-based hand sanitizer with at least 60% alcohol.  . If coughing or sneezing, cover your mouth and nose by coughing or sneezing into the elbow areas of your shirt or coat, .  into a tissue or into your sleeve (not your hands). . Avoid shaking hands with others and consider head nods or verbal greetings only. . Avoid touching your eyes, nose, or mouth with unwashed hands.  . Avoid close contact with people who are sick. . Avoid places or events with large numbers of people in one location, like concerts or sporting events. Marland Kitchen  Carefully consider travel plans you have or are making. . If you are planning any travel outside or inside the [place], visit the CDC's Travelers' Health webpage for the latest health notices. . If you have some symptoms but not all symptoms, continue to monitor at home and seek medical attention  . if your symptoms worsen. . If you are having a medical emergency, call 911.   . >>>>>>>>>>>>>>>>>>>>>>>>>>>>>>>>> . We Do NOT Approve of  [facility], [employer]  Our Patients  To Do Home Visits & We Do NOT Approve of [employer] > > > > > > > > > > > > > > > > > > > > > > > > > > > > > > > > > > > > > > >  Preventive Care for Adults  A healthy lifestyle  and preventive care can promote health and wellness. Preventive health guidelines for women include the following key practices.  A routine yearly physical is a good way to check with your health care provider about your health and preventive screening. It is a chance to share any concerns and updates on your health and to receive a thorough exam.  Visit your dentist for a routine exam and preventive care every 6 months. Brush your teeth twice a day and floss once a day. Good oral hygiene prevents tooth decay and gum disease.  The frequency of eye exams is based on your age, health, family medical history, use of contact lenses, and other factors. Follow your health care provider's recommendations for frequency of eye exams.  Eat a healthy diet. Foods like vegetables, fruits, whole grains, low-fat dairy products, and lean protein foods contain the nutrients you need without too many calories. Decrease your intake of foods high in solid fats, added sugars, and salt. Eat the right amount of calories for you. Get information about a proper diet from your health care provider, if necessary.  Regular physical exercise is one of the most important things you can do for your health. Most adults should get at least 150 minutes of moderate-intensity exercise (any activity that increases your heart rate and causes you to sweat) each week. In addition, most adults need muscle-strengthening exercises on 2 or more days a week.  Maintain a healthy weight. The body mass index (BMI) is a screening tool to identify possible weight problems. It provides an estimate of body fat based on height and weight. Your health care provider can find your BMI and can help you achieve or maintain a healthy weight. For adults 20 years and older:  A BMI below 18.5 is considered underweight.  A BMI of 18.5 to 24.9 is normal.  A BMI of 25 to 29.9 is considered overweight.  A BMI of 30 and above is considered obese.  Maintain  normal blood lipids and cholesterol levels by exercising and minimizing your intake of saturated fat. Eat a balanced diet with plenty of fruit and vegetables. If your lipid or cholesterol levels are high, you are over 50, or you are at high risk for heart disease, you may need your cholesterol levels checked more frequently. Ongoing high lipid and cholesterol levels should be treated with medicines if diet and exercise are not working.  If you smoke, find out from your health care provider how to quit. If you do not use tobacco, do not start.  Lung cancer screening is recommended for adults aged 34-80 years who are at high risk for developing lung cancer  because of a history of smoking. A yearly low-dose CT scan of the lungs is recommended for people who have at least a 30-pack-year history of smoking and are a current smoker or have quit within the past 15 years. A pack year of smoking is smoking an average of 1 pack of cigarettes a day for 1 year (for example: 1 pack a day for 30 years or 2 packs a day for 15 years). Yearly screening should continue until the smoker has stopped smoking for at least 15 years. Yearly screening should be stopped for people who develop a health problem that would prevent them from having lung cancer treatment.  Avoid use of street drugs. Do not share needles with anyone. Ask for help if you need support or instructions about stopping the use of drugs.  High blood pressure causes heart disease and increases the risk of stroke.  Ongoing high blood pressure should be treated with medicines if weight loss and exercise do not work.  If you are 66-18 years old, ask your health care provider if you should take aspirin to prevent strokes.  Diabetes screening involves taking a blood sample to check your fasting blood sugar level. This should be done once every 3 years, after age 35, if you are within normal weight and without risk factors for diabetes. Testing should be considered  at a younger age or be carried out more frequently if you are overweight and have at least 1 risk factor for diabetes.  Breast cancer screening is essential preventive care for women. You should practice "breast self-awareness." This means understanding the normal appearance and feel of your breasts and may include breast self-examination. Any changes detected, no matter how K[place]z, should be reported to a health care provider. Women in their 74s and 30s should have a clinical breast exam (CBE) by a health care provider as part of a regular health exam every 1 to 3 years. After age 5, women should have a CBE every year. Starting at age 52, women should consider having a mammogram (breast X-ray test) every year. Women who have a family history of breast cancer should talk to their health care provider about genetic screening. Women at a high risk of breast cancer should talk to their health care providers about having an MRI and a mammogram every year.  Breast cancer gene (BRCA)-related cancer risk assessment is recommended for women who have family members with BRCA-related cancers. BRCA-related cancers include breast, ovarian, tubal, and peritoneal cancers. Having family members with these cancers may be associated with an increased risk for harmful changes (mutations) in the breast cancer genes BRCA1 and BRCA2. Results of the assessment will determine the need for genetic counseling and BRCA1 and BRCA2 testing.  Routine pelvic exams to screen for cancer are no longer recommended for nonpregnant women who are considered low risk for cancer of the pelvic organs (ovaries, uterus, and vagina) and who do not have symptoms. Ask your health care provider if a screening pelvic exam is right for you.  If you have had past treatment for cervical cancer or a condition that could lead to cancer, you need Pap tests and screening for cancer for at least 20 years after your treatment. If Pap tests have been discontinued,  your risk factors (such as having a new sexual partner) need to be reassessed to determine if screening should be resumed. Some women have medical problems that increase the chance of getting cervical cancer. In these cases, your health care  provider may recommend more frequent screening and Pap tests.    Colorectal cancer can be detected and often prevented. Most routine colorectal cancer screening begins at the age of 12 years and continues through age 30 years. However, your health care provider may recommend screening at an earlier age if you have risk factors for colon cancer. On a yearly basis, your health care provider may provide home test kits to check for hidden blood in the stool. Use of a Kil camera at the end of a tube, to directly examine the colon (sigmoidoscopy or colonoscopy), can detect the earliest forms of colorectal cancer. Talk to your health care provider about this at age 20, when routine screening begins.  Direct exam of the colon should be repeated every 5-10 years through age 68 years, unless early forms of pre-cancerous polyps or Caroll growths are found.  Osteoporosis is a disease in which the bones lose minerals and strength with aging. This can result in serious bone fractures or breaks. The risk of osteoporosis can be identified using a bone density scan. Women ages 11 years and over and women at risk for fractures or osteoporosis should discuss screening with their health care providers. Ask your health care provider whether you should take a calcium supplement or vitamin D to reduce the rate of osteoporosis.  Menopause can be associated with physical symptoms and risks. Hormone replacement therapy is available to decrease symptoms and risks. You should talk to your health care provider about whether hormone replacement therapy is right for you.  Use sunscreen. Apply sunscreen liberally and repeatedly throughout the day. You should seek shade when your shadow is shorter  than you. Protect yourself by wearing long sleeves, pants, a wide-brimmed hat, and sunglasses year round, whenever you are outdoors.  Once a month, do a whole body skin exam, using a mirror to look at the skin on your back. Tell your health care provider of new moles, moles that have irregular borders, moles that are larger than a pencil eraser, or moles that have changed in shape or color.  Stay current with required vaccines (immunizations).  Influenza vaccine. All adults should be immunized every year.  Tetanus, diphtheria, and acellular pertussis (Td, Tdap) vaccine. Pregnant women should receive 1 dose of Tdap vaccine during each pregnancy. The dose should be obtained regardless of the length of time since the last dose. Immunization is preferred during the 27th-36th week of gestation. An adult who has not previously received Tdap or who does not know her vaccine status should receive 1 dose of Tdap. This initial dose should be followed by tetanus and diphtheria toxoids (Td) booster doses every 10 years. Adults with an unknown or incomplete history of completing a 3-dose immunization series with Td-containing vaccines should begin or complete a primary immunization series including a Tdap dose. Adults should receive a Td booster every 10 years.    Zoster vaccine. One dose is recommended for adults aged 25 years or older unless certain conditions are present.    Pneumococcal 13-valent conjugate (PCV13) vaccine. When indicated, a person who is uncertain of her immunization history and has no record of immunization should receive the PCV13 vaccine. An adult aged 77 years or older who has certain medical conditions and has not been previously immunized should receive 1 dose of PCV13 vaccine. This PCV13 should be followed with a dose of pneumococcal polysaccharide (PPSV23) vaccine. The PPSV23 vaccine dose should be obtained at least 1 or more year(s) after the dose of  PCV13 vaccine. An adult aged 68  years or older who has certain medical conditions and previously received 1 or more doses of PPSV23 vaccine should receive 1 dose of PCV13. The PCV13 vaccine dose should be obtained 1 or more years after the last PPSV23 vaccine dose.    Pneumococcal polysaccharide (PPSV23) vaccine. When PCV13 is also indicated, PCV13 should be obtained first. All adults aged 61 years and older should be immunized. An adult younger than age 57 years who has certain medical conditions should be immunized. Any person who resides in a nursing home or long-term care facility should be immunized. An adult smoker should be immunized. People with an immunocompromised condition and certain other conditions should receive both PCV13 and PPSV23 vaccines. People with human immunodeficiency virus (HIV) infection should be immunized as soon as possible after diagnosis. Immunization during chemotherapy or radiation therapy should be avoided. Routine use of PPSV23 vaccine is not recommended for American Indians, [place], or people younger than 65 years unless there are medical conditions that require PPSV23 vaccine. When indicated, people who have unknown immunization and have no record of immunization should receive PPSV23 vaccine. One-time revaccination 5 years after the first dose of PPSV23 is recommended for people aged 19-64 years who have chronic kidney failure, nephrotic syndrome, asplenia, or immunocompromised conditions. People who received 1-2 doses of PPSV23 before age 30 years should receive another dose of PPSV23 vaccine at age 85 years or later if at least 5 years have passed since the previous dose. Doses of PPSV23 are not needed for people immunized with PPSV23 at or after age 57 years.   Preventive Services / Frequency  Ages 77 years and over  Blood pressure check.  Lipid and cholesterol check.  Lung cancer screening. / Every year if you are aged 69-80 years and have a 30-pack-year history of smoking and  currently smoke or have quit within the past 15 years. Yearly screening is stopped once you have quit smoking for at least 15 years or develop a health problem that would prevent you from having lung cancer treatment.  Clinical breast exam.** / Every year after age 1 years.   BRCA-related cancer risk assessment.** / For women who have family members with a BRCA-related cancer (breast, ovarian, tubal, or peritoneal cancers).  Mammogram.** / Every year beginning at age 74 years and continuing for as long as you are in good health. Consult with your health care provider.  Pap test.** / Every 3 years starting at age 59 years through age 36 or 61 years with 3 consecutive normal Pap tests. Testing can be stopped between 65 and 70 years with 3 consecutive normal Pap tests and no abnormal Pap or HPV tests in the past 10 years.  Fecal occult blood test (FOBT) of stool. / Every year beginning at age 69 years and continuing until age 70 years. You may not need to do this test if you get a colonoscopy every 10 years.  Flexible sigmoidoscopy or colonoscopy.** / Every 5 years for a flexible sigmoidoscopy or every 10 years for a colonoscopy beginning at age 58 years and continuing until age 64 years.  Hepatitis C blood test.** / For all people born from [birth year] through 1963 and any individual with known risks for hepatitis C.  Osteoporosis screening.** / A one-time screening for women ages 70 years and over and women at risk for fractures or osteoporosis.  Skin self-exam. / Monthly.  Influenza vaccine. / Every year.  Tetanus, diphtheria, and acellular  pertussis (Tdap/Td) vaccine.** / 1 dose of Td every 10 years.  Zoster vaccine.** / 1 dose for adults aged 60 years or older.  Pneumococcal 13-valent conjugate (PCV13) vaccine.** / Consult your health care provider.  Pneumococcal polysaccharide (PPSV23) vaccine.** / 1 dose for all adults aged 65 years and older. Screening for abdominal aortic aneurysm (AAA)   by ultrasound is recommended for people who have history of high blood pressure or who are current or former smokers. ++++++++++++++++++++ Recommend Adult Low Dose Aspirin or  coated  Aspirin 81 mg daily  To reduce risk of Colon Cancer 40 %,  Skin Cancer 26 % ,  Melanoma 46%  and  Pancreatic cancer 60% ++++++++++++++++++++ Vitamin D goal  is between 70-100.  Please make sure that you are taking your Vitamin D as directed.  It is very important as a natural anti-inflammatory  helping hair, skin, and nails, as well as reducing stroke and heart attack risk.  It helps your bones and helps with mood. It also decreases numerous cancer risks so please take it as directed.  Low Vit D is associated with a 200-300% higher risk for CANCER  and 200-300% higher risk for HEART   ATTACK  &  STROKE.   ...................................... It is also associated with higher death rate at younger ages,  autoimmune diseases like Rheumatoid arthritis, Lupus, Multiple Sclerosis.    Also many other serious conditions, like depression, Alzheimer's Dementia, infertility, muscle aches, fatigue, fibromyalgia - just to name a few. ++++++++++++++++++ Recommend the book "The END of DIETING" by Dr Joel Fuhrman  & the book "The END of DIABETES " by Dr Joel Fuhrman At [employer]com - get book & Audio CD's    Being diabetic has a  300% increased risk for heart attack, stroke, cancer, and alzheimer- type vascular dementia. It is very important that you work harder with diet by avoiding all foods that are white. Avoid white rice (brown & wild rice is OK), white potatoes (sweetpotatoes in moderation is OK), White bread or wheat bread or anything made out of white flour like bagels, donuts, rolls, buns, biscuits, cakes, pastries, cookies, pizza crust, and pasta (made from white flour & egg whites) - vegetarian pasta or spinach or wheat pasta is OK. Multigrain breads like Arnold's or [employer], or multigrain sandwich thins  or flatbreads.  Diet, exercise and weight loss can reverse and cure diabetes in the early stages.  Diet, exercise and weight loss is very important in the control and prevention of complications of diabetes which affects every system in your body, ie. Brain - dementia/stroke, eyes - glaucoma/blindness, heart - heart attack/heart failure, kidneys - dialysis, stomach - gastric paralysis, intestines - malabsorption, nerves - severe painful neuritis, circulation - gangrene & loss of a leg(s), and finally cancer and Alzheimers.    I recommend avoid fried & greasy foods,  sweets/candy, white rice (brown or wild rice or Quinoa is OK), white potatoes (sweet potatoes are OK) - anything made from white flour - bagels, doughnuts, rolls, buns, biscuits,white and wheat breads, pizza crust and traditional pasta made of white flour & egg white(vegetarian pasta or spinach or wheat pasta is OK).  Multi-grain bread is OK - like multi-grain flat bread or sandwich thins. Avoid alcohol in excess. Exercise is also important.    Eat all the vegetables you want - avoid meat, especially red meat and dairy - especially cheese.  Cheese is the most concentrated form of trans-fats which is the worst thing to clog   up our arteries. Veggie cheese is OK which can be found in the fresh produce section at [employer] or [employer] or [employer]  +++++++++++++++++++ DASH Eating Plan  DASH stands for "Dietary Approaches to Stop Hypertension."   The DASH eating plan is a healthy eating plan that has been shown to reduce high blood pressure (hypertension). Additional health benefits may include reducing the risk of type 2 diabetes mellitus, heart disease, and stroke. The DASH eating plan may also help with weight loss. WHAT DO I NEED TO KNOW ABOUT THE DASH EATING PLAN? For the DASH eating plan, you will follow these general guidelines:  Choose foods with a percent daily value for sodium of less than 5% (as listed on the food  label).  Use salt-free seasonings or herbs instead of table salt or sea salt.  Check with your health care provider or pharmacist before using salt substitutes.  Eat lower-sodium products, often labeled as "lower sodium" or "no salt added."  Eat fresh foods.  Eat more vegetables, fruits, and low-fat dairy products.  Choose whole grains. Look for the word "whole" as the first word in the ingredient list.  Choose fish   Limit sweets, desserts, sugars, and sugary drinks.  Choose heart-healthy fats.  Eat veggie cheese   Eat more home-cooked food and less restaurant, buffet, and fast food.  Limit fried foods.  Cook foods using methods other than frying.  Limit canned vegetables. If you do use them, rinse them well to decrease the sodium.  When eating at a restaurant, ask that your food be prepared with less salt, or no salt if possible.                      WHAT FOODS CAN I EAT? Read Dr Fara Olden Fuhrman's books on The End of Dieting & The End of Diabetes  Grains Whole grain or whole wheat bread. Brown rice. Whole grain or whole wheat pasta. Quinoa, bulgur, and whole grain cereals. Low-sodium cereals. Corn or whole wheat flour tortillas. Whole grain cornbread. Whole grain crackers. Low-sodium crackers.  Vegetables Fresh or frozen vegetables (raw, steamed, roasted, or grilled). Low-sodium or reduced-sodium tomato and vegetable juices. Low-sodium or reduced-sodium tomato sauce and paste. Low-sodium or reduced-sodium canned vegetables.   Fruits All fresh, canned (in natural juice), or frozen fruits.  Protein Products  All fish and seafood.  Dried beans, peas, or lentils. Unsalted nuts and seeds. Unsalted canned beans.  Dairy Low-fat dairy products, such as skim or 1% milk, 2% or reduced-fat cheeses, low-fat ricotta or cottage cheese, or plain low-fat yogurt. Low-sodium or reduced-sodium cheeses.  Fats and Oils Tub margarines without trans fats. Light or reduced-fat mayonnaise  and salad dressings (reduced sodium). Avocado. Safflower, olive, or canola oils. Natural peanut or almond butter.  Other Unsalted popcorn and pretzels. The items listed above may not be a complete list of recommended foods or beverages. Contact your dietitian for more options.  +++++++++++++++  WHAT FOODS ARE NOT RECOMMENDED? Grains/ White flour or wheat flour White bread. White pasta. White rice. Refined cornbread. Bagels and croissants. Crackers that contain trans fat.  Vegetables  Creamed or fried vegetables. Vegetables in a . Regular canned vegetables. Regular canned tomato sauce and paste. Regular tomato and vegetable juices.  Fruits Dried fruits. Canned fruit in light or heavy syrup. Fruit juice.  Meat and Other Protein Products Meat in general - RED meat & White meat.  Fatty cuts of meat. Ribs, chicken wings, all processed meats as  bacon, sausage, bologna, salami, fatback, hot dogs, bratwurst and packaged luncheon meats.  Dairy Whole or 2% milk, cream, half-and-half, and cream cheese. Whole-fat or sweetened yogurt. Full-fat cheeses or blue cheese. Non-dairy creamers and whipped toppings. Processed cheese, cheese spreads, or cheese curds.  Condiments Onion and garlic salt, seasoned salt, table salt, and sea salt. Canned and packaged gravies. Worcestershire sauce. Tartar sauce. Barbecue sauce. Teriyaki sauce. Soy sauce, including reduced sodium. Steak sauce. Fish sauce. Oyster sauce. Cocktail sauce. Horseradish. Ketchup and mustard. Meat flavorings and tenderizers. Bouillon cubes. Hot sauce. Tabasco sauce. Marinades. Taco seasonings. Relishes.  Fats and Oils Butter, stick margarine, lard, shortening and bacon fat. Coconut, palm kernel, or palm oils. Regular salad dressings.  Pickles and olives. Salted popcorn and pretzels.  The items listed above may not be a complete list of foods and beverages to avoid.

## 2019-11-10 NOTE — Progress Notes (Signed)
Annual Screening/Preventative Visit & Comprehensive Evaluation &  Examination     This very nice 85 y.o. WWF  presents for a Screening /Preventative Visit & comprehensive evaluation and management of multiple medical co-morbidities.  Patient has been followed for HTN, HLD, Prediabetes  and Vitamin D Deficiency.In 1998, she underwent RUL for Ca & remains in remission.       HTN predates since 2000. Patient's BP has been controlled at home and patient denies any cardiac symptoms as chest pain, palpitations, shortness of breath, dizziness or ankle swelling. Today's BP is at goal - 126/72.      Patient's hyperlipidemia is controlled with diet and medications. Patient denies myalgias or other medication SE's. Last lipids were at goal:  Lab Results  Component Value Date   CHOL 159 08/06/2019   HDL 61 08/06/2019   LDLCALC 81 08/06/2019   TRIG 90 08/06/2019   CHOLHDL 2.6 08/06/2019       Patient has hx/o prediabetes (A1c 5.9% / 2009)  and patient denies reactive hypoglycemic symptoms, visual blurring, diabetic polys or paresthesias. Last A1c was not at goal:  Lab Results  Component Value Date   HGBA1C 5.9 (H) 08/06/2019       Finally, patient has history of Vitamin D Deficiency ("26" / 2008)  and last Vitamin D was at goal:  Lab Results  Component Value Date   VD25OH 103 (H) 09/17/2019    Current Outpatient Medications on File Prior to Visit  Medication Sig  . aspirin 81 MG chewable tablet Chew 81 mg by mouth daily.  . cetirizine (ZYRTEC) 10 MG tablet Takes 1 tablet Daily  . Cyanocobalamin (VITAMIN B-12) 1000 MCG SUBL Takes 1 tab SL Daily  . diphenhydramine-acetaminophen (TYLENOL PM) 25-500 MG TABS Take 1 tablet by mouth at bedtime. Take every night per patient  . hydrochlorothiazide (HYDRODIURIL) 25 MG tablet Take ONE (1) tablet Daily for BP & Fluid Retention  . metoprolol tartrate (LOPRESSOR) 50 MG tablet Take 1 tablet 2 x /day for BP  . montelukast (SINGULAIR) 10 MG tablet Take 1  tablet daily for Allergies  . OVER THE COUNTER MEDICATION   . rosuvastatin (CRESTOR) 5 MG tablet Take 1 tablet (5 mg total) by mouth at bedtime.   No current facility-administered medications on file prior to visit.    Allergies  Allergen Reactions  . Ace Inhibitors Cough  . Calcium-Containing Compounds Other (See Comments)    constpation  . Lactose Intolerance (Gi) Diarrhea  . Polysporin [Bacitracin-Polymyxin B] Rash    Polysporin or neosporin gave her a rash, couldn't remember which   Past Medical History:  Diagnosis Date  . COPD (chronic obstructive pulmonary disease) with emphysema ([place])   . Hyperlipidemia   . Hypertension   . Lung cancer, upper lobe ([place]) 12/04/1996   RUL  . Osteoporosis   . S/P partial lobectomy of lung    [id number]  . Thigh shingles 12/04/1996  . Thoracic aorta atherosclerosis ([place])   . Vitamin D deficiency    Health Maintenance  Topic Date Due  . INFLUENZA VACCINE  07/05/2019  . TETANUS/TDAP  04/04/2023  . DEXA SCAN  Completed  . PNA vac Low Risk Adult  Completed   Immunization History  Administered Date(s) Administered  . Influenza, High Dose Seasonal PF 09/15/2015, 10/02/2017, 07/27/2019  . Influenza-Unspecified 08/04/2012, 09/03/2013, 09/03/2018  . Pneumococcal Conjugate-13 07/21/2014  . Pneumococcal Polysaccharide-23 10/30/2005  . Td 04/03/2013   Last Colon - 09/29/2013 - Dr Earlean Shawl  Last MGM  & dexa  BMD - 05/21/2019  Past Surgical History:  Procedure Laterality Date  . CARPAL TUNNEL RELEASE Right    2009  . LUNG REMOVAL, PARTIAL Right    RUL 1998  . OLECRANON BURSECTOMY Left 03/20/2017   Procedure: EXICISION OLECRANON BURSA LEFT ELBOW;  Surgeon: Daryll Brod, MD;  Location: [facility];  Service: Orthopedics;  Laterality: Left;  with scb block in preop  . SHOULDER ADHESION RELEASE Right    2004   Family History  Problem Relation Age of Onset  . Heart disease Mother   . Lung cancer Father   . Breast cancer Paternal  Aunt    Social History   Tobacco Use  . Smoking status: Former Smoker    Quit date: 12/04/1996    Years since quitting: 22.9  . Smokeless tobacco: Never Used  Substance Use Topics  . Alcohol use: Yes    Alcohol/week: 1.0 standard drinks    Types: 1 Glasses of wine per week    Comment: social  . Drug use: No    ROS Constitutional: Denies fever, chills, weight loss/gain, headaches, insomnia,  night sweats, and change in appetite. Does c/o fatigue. Eyes: Denies redness, blurred vision, diplopia, discharge, itchy, watery eyes.  ENT: Denies discharge, congestion, post nasal drip, epistaxis, sore throat, earache, hearing loss, dental pain, Tinnitus, Vertigo, Sinus pain, snoring.  Cardio: Denies chest pain, palpitations, irregular heartbeat, syncope, dyspnea, diaphoresis, orthopnea, PND, claudication, edema Respiratory: denies cough, dyspnea, DOE, pleurisy, hoarseness, laryngitis, wheezing.  Gastrointestinal: Denies dysphagia, heartburn, reflux, water brash, pain, cramps, nausea, vomiting, bloating, diarrhea, constipation, hematemesis, melena, hematochezia, jaundice, hemorrhoids Genitourinary: Denies dysuria, frequency, urgency, nocturia, hesitancy, discharge, hematuria, flank pain Breast: Breast lumps, nipple discharge, bleeding.  Musculoskeletal: Denies arthralgia, myalgia, stiffness, Jt. Swelling, pain, limp, and strain/sprain. Denies falls. Skin: Denies puritis, rash, hives, warts, acne, eczema, changing in skin lesion Neuro: No weakness, tremor, incoordination, spasms, paresthesia, pain Psychiatric: Denies confusion, memory loss, sensory loss. Denies Depression. Endocrine: Denies change in weight, skin, hair change, nocturia, and paresthesia, diabetic polys, visual blurring, hyper / hypo glycemic episodes.  Heme/Lymph: No excessive bleeding, bruising, enlarged lymph nodes.  Physical Exam  BP 126/72   Pulse 64   Temp (!) 97 F (36.1 C)   Resp 16   Ht 5' 2.5" (1.588 m)   Wt 144 lb  12.8 oz (65.7 kg)   BMI 26.06 kg/m   General Appearance: Well nourished, well groomed and in no apparent distress.  Eyes: PERRLA, EOMs, conjunctiva no swelling or erythema, normal fundi and vessels. Sinuses: No frontal/maxillary tenderness ENT/Mouth: EACs patent / TMs  nl. Nares clear without erythema, swelling, mucoid exudates. Oral hygiene is good. No erythema, swelling, or exudate. Tongue normal, non-obstructing. Tonsils not swollen or erythematous. Hearing normal.  Neck: Supple, thyroid not palpable. No bruits, nodes or JVD. Respiratory: Respiratory effort normal.  BS equal and clear bilateral without rales, rhonci, wheezing or stridor. Cardio: Heart sounds are normal with regular rate and rhythm and no murmurs, rubs or gallops. Peripheral pulses are normal and equal bilaterally without edema. No aortic or femoral bruits. Chest: symmetric with normal excursions and percussion. Breasts: Symmetric, without lumps, nipple discharge, retractions, or fibrocystic changes.  Abdomen: Flat, soft with bowel sounds active. Nontender, no guarding, rebound, hernias, masses, or organomegaly.  Lymphatics: Non tender without lymphadenopathy.  Genitourinary:  Musculoskeletal: Full ROM all peripheral extremities, joint stability, 5/5 strength, and normal gait. Skin: Warm and dry without rashes, lesions, cyanosis, clubbing or  ecchymosis.  Neuro: Cranial nerves intact,  reflexes equal bilaterally. Normal muscle tone, no cerebellar symptoms. Sensation intact.  Pysch: Alert and oriented X 3, normal affect, Insight and Judgment appropriate.   Assessment and Plan  1. Annual Preventative Screening Examination  2. Essential hypertension  - EKG 12-Lead - Urinalysis, Routine w reflex microscopic - Microalbumin / Creatinine Urine Ratio - DG Chest 2 View; Future - CBC with Diff - COMPLETE METABOLIC PANEL WITH GFR - Magnesium - TSH  3. Hyperlipidemia, mixed  - EKG 12-Lead - Lipid Profile - TSH  4.  Abnormal glucose  - EKG 12-Lead - Hemoglobin A1c ([facility]) - Insulin, random  5. Vitamin D deficiency  - Vitamin D (25 hydroxy)  6. Prediabetes  - EKG 12-Lead - Hemoglobin A1c ([facility]) - Insulin, random  7. Pulmonary emphysema ([place])  - DG Chest 2 View; Future  8. Screening for colorectal cancer  - POC Hemoccult Bld/Stl   9. Screening for ischemic heart disease  - EKG 12-Lead  10. FHx: heart disease  - EKG 12-Lead  11. Former smoker - EKG 12-Lead  12. Atherosclerosis of aorta ([facility])  - EKG 12-Lead  13. Medication management  - Urinalysis, Routine w reflex microscopic - Microalbumin / Creatinine Urine Ratio - CBC with Diff - COMPLETE METABOLIC PANEL WITH GFR - Magnesium - Lipid Profile - TSH - Hemoglobin A1c ([facility]) - Insulin, random - Vitamin D (25 hydroxy)         Patient was counseled in prudent diet to achieve/maintain BMI less than 25 for weight control, BP monitoring, regular exercise and medications. Discussed med's effects and SE's. Screening labs and tests as requested with regular follow-up as recommended. Over 40 minutes of exam, counseling, chart review and high complex critical decision making was performed.   Kirtland Bouchard, MD

## 2019-11-11 ENCOUNTER — Ambulatory Visit
Admission: RE | Admit: 2019-11-11 | Discharge: 2019-11-11 | Disposition: A | Discharge status: Home / Self Care | Payer: Medicare Other | Source: Ambulatory Visit | Attending: Internal Medicine | Admitting: Internal Medicine

## 2019-11-11 ENCOUNTER — Other Ambulatory Visit: Payer: Self-pay

## 2019-11-11 ENCOUNTER — Ambulatory Visit (INDEPENDENT_AMBULATORY_CARE_PROVIDER_SITE_OTHER): Payer: Medicare Other | Admitting: Internal Medicine

## 2019-11-11 VITALS — BP 126/72 | HR 64 | Temp 97.0°F | Resp 16 | Ht 62.5 in | Wt 144.8 lb

## 2019-11-11 DIAGNOSIS — Z0000 Encounter for general adult medical examination without abnormal findings: Secondary | ICD-10-CM

## 2019-11-11 DIAGNOSIS — I10 Essential (primary) hypertension: Secondary | ICD-10-CM

## 2019-11-11 DIAGNOSIS — Z0001 Encounter for general adult medical examination with abnormal findings: Secondary | ICD-10-CM

## 2019-11-11 DIAGNOSIS — Z1211 Encounter for screening for malignant neoplasm of colon: Secondary | ICD-10-CM

## 2019-11-11 DIAGNOSIS — R7303 Prediabetes: Secondary | ICD-10-CM | POA: Diagnosis not present

## 2019-11-11 DIAGNOSIS — R7309 Other abnormal glucose: Secondary | ICD-10-CM

## 2019-11-11 DIAGNOSIS — E559 Vitamin D deficiency, unspecified: Secondary | ICD-10-CM | POA: Diagnosis not present

## 2019-11-11 DIAGNOSIS — Z87891 Personal history of nicotine dependence: Secondary | ICD-10-CM

## 2019-11-11 DIAGNOSIS — I700 Atherosclerosis of aorta: Secondary | ICD-10-CM | POA: Diagnosis not present

## 2019-11-11 DIAGNOSIS — J439 Emphysema, unspecified: Secondary | ICD-10-CM

## 2019-11-11 DIAGNOSIS — E782 Mixed hyperlipidemia: Secondary | ICD-10-CM | POA: Diagnosis not present

## 2019-11-11 DIAGNOSIS — Z79899 Other long term (current) drug therapy: Secondary | ICD-10-CM

## 2019-11-11 DIAGNOSIS — Z136 Encounter for screening for cardiovascular disorders: Secondary | ICD-10-CM

## 2019-11-11 DIAGNOSIS — Z8249 Family history of ischemic heart disease and other diseases of the circulatory system: Secondary | ICD-10-CM

## 2019-11-11 DIAGNOSIS — M25511 Pain in right shoulder: Secondary | ICD-10-CM

## 2019-11-11 DIAGNOSIS — Z85118 Personal history of other malignant neoplasm of bronchus and lung: Secondary | ICD-10-CM | POA: Diagnosis not present

## 2019-11-11 DIAGNOSIS — Z1212 Encounter for screening for malignant neoplasm of rectum: Secondary | ICD-10-CM

## 2019-11-11 MED ORDER — MELOXICAM 15 MG PO TABS: ORAL_TABLET | ORAL | 3 refills | Status: DC

## 2019-11-11 MED ORDER — CHOLECALCIFEROL 125 MCG (5000 UT) PO CAPS: ORAL_CAPSULE | ORAL | 99 refills | Status: DC

## 2019-11-12 LAB — URINALYSIS, ROUTINE W REFLEX MICROSCOPIC
Bacteria, UA: NONE SEEN /HPF
Bilirubin Urine: NEGATIVE
Glucose, UA: NEGATIVE
Hgb urine dipstick: NEGATIVE
Hyaline Cast: NONE SEEN /LPF
Ketones, ur: NEGATIVE
Nitrite: NEGATIVE
Protein, ur: NEGATIVE
Specific Gravity, Urine: 1.008 (ref 1.001–1.03)
pH: 7 (ref 5.0–8.0)

## 2019-11-12 LAB — COMPLETE METABOLIC PANEL WITH GFR
AG Ratio: 1.6 (calc) (ref 1.0–2.5)
ALT: 11 U/L (ref 6–29)
AST: 22 U/L (ref 10–35)
Albumin: 4.3 g/dL (ref 3.6–5.1)
Alkaline phosphatase (APISO): 59 U/L (ref 37–153)
BUN: 20 mg/dL (ref 7–25)
CO2: 31 mmol/L (ref 20–32)
Calcium: 9.8 mg/dL (ref 8.6–10.4)
Chloride: 102 mmol/L (ref 98–110)
Creat: 0.83 mg/dL (ref 0.60–0.88)
GFR, Est African American: 75 mL/min/{1.73_m2} (ref >=60)
GFR, Est Non African American: 65 mL/min/{1.73_m2} (ref >=60)
Globulin: 2.7 g/dL (calc) (ref 1.9–3.7)
Glucose, Bld: 102 mg/dL — ABNORMAL HIGH (ref 65–99)
Potassium: 3.7 mmol/L (ref 3.5–5.3)
Sodium: 143 mmol/L (ref 135–146)
Total Bilirubin: 0.6 mg/dL (ref 0.2–1.2)
Total Protein: 7 g/dL (ref 6.1–8.1)

## 2019-11-12 LAB — CBC WITH DIFFERENTIAL/PLATELET
Absolute Monocytes: 506 cells/uL (ref 200–950)
Basophils Absolute: 18 cells/uL (ref 0–200)
Basophils Relative: 0.4 %
Eosinophils Absolute: 147 cells/uL (ref 15–500)
Eosinophils Relative: 3.2 %
HCT: 41.3 % (ref 35.0–45.0)
Hemoglobin: 13.6 g/dL (ref 11.7–15.5)
Lymphs Abs: 1504 cells/uL (ref 850–3900)
MCH: 28.8 pg (ref 27.0–33.0)
MCHC: 32.9 g/dL (ref 32.0–36.0)
MCV: 87.3 fL (ref 80.0–100.0)
MPV: 11.3 fL (ref 7.5–12.5)
Monocytes Relative: 11 %
Neutro Abs: 2424 cells/uL (ref 1500–7800)
Neutrophils Relative %: 52.7 %
Platelets: 168 10*3/uL (ref 140–400)
RBC: 4.73 10*6/uL (ref 3.80–5.10)
RDW: 12.9 % (ref 11.0–15.0)
Total Lymphocyte: 32.7 %
WBC: 4.6 10*3/uL (ref 3.8–10.8)

## 2019-11-12 LAB — MICROALBUMIN / CREATININE URINE RATIO
Creatinine, Urine: 37 mg/dL (ref 20–275)
Microalb Creat Ratio: 38 mcg/mg creat — ABNORMAL HIGH (ref <30)
Microalb, Ur: 1.4 mg/dL

## 2019-11-12 LAB — LIPID PANEL
Cholesterol: 170 mg/dL (ref <200)
HDL: 64 mg/dL (ref >=50)
LDL Cholesterol (Calc): 89 mg/dL (calc)
Non-HDL Cholesterol (Calc): 106 mg/dL (calc) (ref <130)
Total CHOL/HDL Ratio: 2.7 (calc) (ref <5.0)
Triglycerides: 84 mg/dL (ref <150)

## 2019-11-12 LAB — MAGNESIUM: Magnesium: 1.5 mg/dL (ref 1.5–2.5)

## 2019-11-12 LAB — HEMOGLOBIN A1C
Hgb A1c MFr Bld: 5.9 % of total Hgb — ABNORMAL HIGH (ref <5.7)
Mean Plasma Glucose: 123 (calc)
eAG (mmol/L): 6.8 (calc)

## 2019-11-12 LAB — TSH: TSH: 1.57 mIU/L (ref 0.40–4.50)

## 2019-11-12 LAB — INSULIN, RANDOM: Insulin: 3 u[IU]/mL

## 2019-11-12 LAB — VITAMIN D 25 HYDROXY (VIT D DEFICIENCY, FRACTURES): Vit D, 25-Hydroxy: 113 ng/mL — ABNORMAL HIGH (ref 30–100)

## 2019-11-21 ENCOUNTER — Other Ambulatory Visit: Payer: Medicare Other

## 2019-12-09 ENCOUNTER — Other Ambulatory Visit: Payer: Self-pay

## 2019-12-09 ENCOUNTER — Encounter: Payer: Self-pay | Admitting: Physician Assistant

## 2019-12-09 ENCOUNTER — Ambulatory Visit (INDEPENDENT_AMBULATORY_CARE_PROVIDER_SITE_OTHER): Payer: Medicare Other | Admitting: Physician Assistant

## 2019-12-09 ENCOUNTER — Ambulatory Visit
Admission: RE | Admit: 2019-12-09 | Discharge: 2019-12-09 | Disposition: A | Discharge status: Home / Self Care | Payer: Medicare Other | Source: Ambulatory Visit | Attending: Physician Assistant | Admitting: Physician Assistant

## 2019-12-09 VITALS — BP 118/66 | HR 61 | Temp 97.3°F | Wt 136.2 lb

## 2019-12-09 DIAGNOSIS — U071 COVID-19: Secondary | ICD-10-CM

## 2019-12-09 DIAGNOSIS — E611 Iron deficiency: Secondary | ICD-10-CM

## 2019-12-09 DIAGNOSIS — J1282 Pneumonia due to coronavirus disease 2019: Secondary | ICD-10-CM

## 2019-12-09 DIAGNOSIS — R350 Frequency of micturition: Secondary | ICD-10-CM | POA: Diagnosis not present

## 2019-12-09 DIAGNOSIS — R918 Other nonspecific abnormal finding of lung field: Secondary | ICD-10-CM | POA: Diagnosis not present

## 2019-12-09 DIAGNOSIS — R5383 Other fatigue: Secondary | ICD-10-CM

## 2019-12-09 HISTORY — DX: COVID-19: U07.1

## 2019-12-09 MED ORDER — DOXYCYCLINE HYCLATE 100 MG PO CAPS: ORAL_CAPSULE | ORAL | 0 refills | Status: DC

## 2019-12-09 NOTE — Patient Instructions (Signed)

## 2019-12-09 NOTE — Progress Notes (Addendum)
+ COVID pnuemonia on CXR  20 [phone]- PLEASE CALL THIS NUMBER TO CONTACT PATIENT, THIS IS HER DAUGHTER SANDY  CALLED PATIENT VIA DAUGHTERS PHONE She is on doxy, patient is feeling better today, she is up and moving.  Did strict ER precautions, any confusion, worsening SOB, CP, weakness will go to ER.  Will try to get evaluated for COVID pneumonia treatment outpatient.   Currently pending some inflammatory markers but CBC and CMP normal other than elevated CO2, ferritin slightly elevated, LDH negative.  Will call patient to see if symptoms have gotten worse, otherwise will try to set patient up for outpatient treatment if possible though patient is very high risk for decompensation due to age and related co morbidities. Very low threshold to send to the ER.  CXR 12/09/2019 IMPRESSION: 1. Mild left upper lobe and bibasilar infiltrates, left greater than right. Assessment and Plan:  COVID-19 -     + rhonchi left lower lobe, Secondary infection versus COVID pneumonia, saturation is 96% on RA, will get labs, and CXR  - treat for possible secondary infection -     doxycycline (VIBRAMYCIN) 100 MG capsule; Take 1 capsule twice daily with food - STRICT er precautions dicussed with patient and Sandy.  -     D-dimer, quantitative -     Lactate dehydrogenase -     EKG 12-Lead -     DG Chest 2 View; Future  Fatigue, unspecified type -     doxycycline (VIBRAMYCIN) 100 MG capsule; Take 1 capsule twice daily with food ? Post covid syndrome but will rule out infection, anemia, COVID pneumonia, COPD exacerbation, heart issues, etc.  -     CBC with Diff -     COMPLETE METABOLIC PANEL WITH GFR -     TSH -     D-dimer, quantitative -     Lactate dehydrogenase -     EKG 12-Lead -     DG Chest 2 View; Future  Urinary frequency -     Urinalysis, Routine w reflex microscopic -     Culture, Urine  Iron deficiency -     Iron,Total/Total Iron Binding Cap -     Ferritin   The patient wore a mask  the entire visit and I was wearing an N95 and face shield with gown.   Emphasized social distancing, proper face mask wearing, and hand washing.  The patient was advised to call immediately if she has any concerning symptoms in the interval. The patient voices understanding of current treatment options and is in agreement with the current care plan.The patient knows to call the clinic with any problems, questions or concerns or go to the ER if any further progression of symptoms.   Over 30 minutes of exam, counseling, chart review, and complex, high level critical decision making was performed this visit.   HPI 86 y.o.female with history COPD, lung cancer s/p right lobectomy, HTN, chol  presents for 1 month follow up for labs. She was seen for a physical 12/08 with Dr Melford Aase. She has been moving, unpacking and this has slowed down. Lovey Newcomer has been helping her at home.  Her magnesium was low last visit.   + for COVID Dec 17th, had slight cough that day but resolved very quicckly, then middle of the next week she had fatigue, she continues to have fatigue.  She has never had a fever or cough.  Denies HA, trouble swallowing, CP, worsening SOB, leg swelling, palpitations, diarrhea, constipation, blood in  stool. She has had some decreased appetite and nausea.   Blood pressure 118/66, pulse 61, temperature (!) 97.3 F (36.3 C), weight 136 lb 3.2 oz (61.8 kg), SpO2 96 %.   Patient Active Problem List   Diagnosis Date Noted  . Abnormal glucose 10/09/2018  . Osteoporosis 12/28/2015  . COPD (chronic obstructive pulmonary disease) with emphysema ([place]) 02/25/2015  . Atherosclerosis of aorta ([place]) 02/25/2015  . Essential hypertension   . Hyperlipidemia, mixed   . Vitamin D deficiency   . Personal history of lung cancer 12/04/1996      Current Outpatient Medications (Cardiovascular):  .  hydrochlorothiazide (HYDRODIURIL) 25 MG tablet, Take ONE (1) tablet Daily for BP & Fluid Retention .   metoprolol tartrate (LOPRESSOR) 50 MG tablet, Take 1 tablet 2 x /day for BP .  rosuvastatin (CRESTOR) 5 MG tablet, Take 1 tablet (5 mg total) by mouth at bedtime.  Current Outpatient Medications (Respiratory):  .  cetirizine (ZYRTEC) 10 MG tablet, Takes 1 tablet Daily .  montelukast (SINGULAIR) 10 MG tablet, Take 1 tablet daily for Allergies  Current Outpatient Medications (Analgesics):  .  aspirin 81 MG chewable tablet, Chew 81 mg by mouth daily. .  meloxicam (MOBIC) 15 MG tablet, Take 1/2 to 1  tablet Daily with Food for Pain & Inflammation  Current Outpatient Medications (Hematological):  Marland Kitchen  Cyanocobalamin (VITAMIN B-12) 1000 MCG SUBL, Takes 1 tab SL Daily  Current Outpatient Medications (Other):  Marland Kitchen  Cholecalciferol 125 MCG (5000 UT) capsule, Take 2 capsules Daily .  diphenhydramine-acetaminophen (TYLENOL PM) 25-500 MG TABS, Take 1 tablet by mouth at bedtime. Take every night per patient .  OVER THE COUNTER MEDICATION,   Allergies  Allergen Reactions  . Ace Inhibitors Cough  . Calcium-Containing Compounds Other (See Comments)    constpation  . Lactose Intolerance (Gi) Diarrhea  . Polysporin [Bacitracin-Polymyxin B] Rash    Polysporin or neosporin gave her a rash, couldn't remember which   Allergies Allergies  Allergen Reactions  . Ace Inhibitors Cough  . Calcium-Containing Compounds Other (See Comments)    constpation  . Lactose Intolerance (Gi) Diarrhea  . Polysporin [Bacitracin-Polymyxin B] Rash    Polysporin or neosporin gave her a rash, couldn't remember which    SURGICAL HISTORY She  has a past surgical history that includes Carpal tunnel release (Right); Shoulder adhesion release (Right); Lung removal, partial (Right); and Olecranon bursectomy (Left, 03/20/2017). FAMILY HISTORY Her family history includes Breast cancer in her paternal aunt; Heart disease in her mother; Lung cancer in her father. SOCIAL HISTORY She  reports that she quit smoking about 23 years ago.  She has never used smokeless tobacco. She reports current alcohol use of about 1.0 standard drinks of alcohol per week. She reports that she does not use drugs.  ROS: all negative except above.   Physical Exam: Filed Weights   12/09/19 1040  Weight: 136 lb 3.2 oz (61.8 kg)   BP 118/66   Pulse 61   Temp (!) 97.3 F (36.3 C)   Wt 136 lb 3.2 oz (61.8 kg)   SpO2 96%   BMI 24.51 kg/m  General Appearance:  in no apparent distress, pale appearing.  Eyes: PERRLA, EOMs, conjunctiva no swelling or erythema Sinuses: No Frontal/maxillary tenderness ENT/Mouth: mouth not checked, patient in a mask.Marland Kitchen Hearing decreased Neck: Supple, thyroid normal.  Respiratory: Respiratory effort normal, Left lower lobe with rhonchi.  Cardio: RRR with no MRGs. Brisk peripheral pulses without edema.  Abdomen: Soft, + BS.  Non  tender, no guarding, rebound, hernias, masses. Lymphatics: Non tender without lymphadenopathy.  Musculoskeletal: Full ROM, 4/5 strength diffuse, slow gait.  Skin: Warm, dry without rashes, lesions, ecchymosis.  Neuro: Cranial nerves intact. Normal muscle tone, no cerebellar symptoms.  Psych: Awake and oriented X 3, normal affect, Insight and Judgment appropriate.     Vicie Mutters, PA-C 10:59 AM [facility]

## 2019-12-10 LAB — URINE CULTURE
MICRO NUMBER:: 10009208
Result:: NO GROWTH
SPECIMEN QUALITY:: ADEQUATE

## 2019-12-10 LAB — TSH: TSH: 1.64 mIU/L (ref 0.40–4.50)

## 2019-12-10 LAB — COMPLETE METABOLIC PANEL WITH GFR
AG Ratio: 1.2 (calc) (ref 1.0–2.5)
ALT: 12 U/L (ref 6–29)
AST: 22 U/L (ref 10–35)
Albumin: 3.6 g/dL (ref 3.6–5.1)
Alkaline phosphatase (APISO): 58 U/L (ref 37–153)
BUN: 23 mg/dL (ref 7–25)
CO2: 38 mmol/L — ABNORMAL HIGH (ref 20–32)
Calcium: 10.4 mg/dL (ref 8.6–10.4)
Chloride: 96 mmol/L — ABNORMAL LOW (ref 98–110)
Creat: 0.82 mg/dL (ref 0.60–0.88)
GFR, Est African American: 76 mL/min/{1.73_m2} (ref >=60)
GFR, Est Non African American: 66 mL/min/{1.73_m2} (ref >=60)
Globulin: 3.1 g/dL (calc) (ref 1.9–3.7)
Glucose, Bld: 82 mg/dL (ref 65–99)
Potassium: 3.6 mmol/L (ref 3.5–5.3)
Sodium: 140 mmol/L (ref 135–146)
Total Bilirubin: 0.4 mg/dL (ref 0.2–1.2)
Total Protein: 6.7 g/dL (ref 6.1–8.1)

## 2019-12-10 LAB — CBC WITH DIFFERENTIAL/PLATELET
Absolute Monocytes: 837 cells/uL (ref 200–950)
Basophils Absolute: 31 cells/uL (ref 0–200)
Basophils Relative: 0.5 %
Eosinophils Absolute: 112 cells/uL (ref 15–500)
Eosinophils Relative: 1.8 %
HCT: 41.2 % (ref 35.0–45.0)
Hemoglobin: 13.1 g/dL (ref 11.7–15.5)
Lymphs Abs: 1339 cells/uL (ref 850–3900)
MCH: 27.2 pg (ref 27.0–33.0)
MCHC: 31.8 g/dL — ABNORMAL LOW (ref 32.0–36.0)
MCV: 85.7 fL (ref 80.0–100.0)
MPV: 10.1 fL (ref 7.5–12.5)
Monocytes Relative: 13.5 %
Neutro Abs: 3881 cells/uL (ref 1500–7800)
Neutrophils Relative %: 62.6 %
Platelets: 339 10*3/uL (ref 140–400)
RBC: 4.81 10*6/uL (ref 3.80–5.10)
RDW: 12.8 % (ref 11.0–15.0)
Total Lymphocyte: 21.6 %
WBC: 6.2 10*3/uL (ref 3.8–10.8)

## 2019-12-10 LAB — D-DIMER, QUANTITATIVE: D-Dimer, Quant: 3.88 mcg/mL FEU — ABNORMAL HIGH (ref <0.50)

## 2019-12-10 LAB — FERRITIN: Ferritin: 373 ng/mL — ABNORMAL HIGH (ref 16–288)

## 2019-12-10 LAB — IRON, TOTAL/TOTAL IRON BINDING CAP
%SAT: 18 % (calc) (ref 16–45)
Iron: 46 ug/dL (ref 45–160)
TIBC: 259 mcg/dL (calc) (ref 250–450)

## 2019-12-10 LAB — LACTATE DEHYDROGENASE: LDH: 191 U/L (ref 120–250)

## 2019-12-10 NOTE — Addendum Note (Signed)
Addended by: Vicie Mutters R on: 12/10/2019 08:46 AM   Modules accepted: Orders

## 2019-12-11 ENCOUNTER — Telehealth: Payer: Self-pay | Admitting: Infectious Diseases

## 2019-12-11 NOTE — Telephone Encounter (Signed)
Called to discuss with patient about Covid symptoms and the use of bamlanivimab, a monoclonal antibody infusion for those with mild to moderate Covid symptoms and at a high risk of hospitalization.  Given her symptoms have been lasting > 10 days I cannot offer her treatment with monoclonal antibodies.   She sounds to be improved since starting an antibiotic for possibly secondary bacterial pneumonia. She has a history of lung cancer s/p resection in the past and asked about repeating another chest xray - I told her that I would prefer to defer that to Dr. Melford Aase given he is more familiar with her history, which she agreed was the best idea.

## 2019-12-13 ENCOUNTER — Other Ambulatory Visit: Payer: Self-pay | Admitting: Internal Medicine

## 2019-12-13 DIAGNOSIS — I10 Essential (primary) hypertension: Secondary | ICD-10-CM

## 2019-12-13 MED ORDER — METOPROLOL TARTRATE 50 MG PO TABS: ORAL_TABLET | ORAL | 1 refills | Status: DC

## 2019-12-22 ENCOUNTER — Other Ambulatory Visit: Payer: Self-pay | Admitting: Physician Assistant

## 2020-02-09 ENCOUNTER — Encounter: Payer: Self-pay | Admitting: Adult Health

## 2020-02-09 NOTE — Progress Notes (Deleted)
MEDICARE ANNUAL WELLNESS VISIT AND FOLLOW UP  Assessment:   Encounter for annual medicare wellness exam 1 year  Essential hypertension - continue medications, DASH diet, exercise and monitor at home. Call if greater than 130/80.  - CBC with Differential/Platelet - CMP/GFR - TSH   Hyperlipidemia check lipids, decrease fatty foods, increase activity.  - Lipid panel -     rosuvastatin (CRESTOR) 5 MG tablet; Take 1 tablet (5 mg total) by mouth at bedtime.  Abnormal glucose Discussed general issues about diabetes pathophysiology and management., Educational material distributed., Suggested low cholesterol diet., Encouraged aerobic exercise., Discussed foot care., Reminded to get yearly retinal exam. - Hemoglobin A1c   Vitamin D deficiency - Vit D  25 hydroxy (rtn osteoporosis monitoring)   Medication management - Magnesium  Atherosclerosis of aorta ([place]) Per CT 10/2014 Control blood pressure, cholesterol, glucose, increase exercise.   Pulmonary emphysema, unspecified emphysema type ([place]) Remote hx of smoking; stable sx s/p lobectomy for lung cancer; Monitor  Hx of cancer of lung CXR 10/2018 normal, continue follow up   Osteoporosis Continue vitamin D, Ca, weight bearing exercises Was on alendronate 2016-2019 with T score improved -2.7 > -1.9 Dexa 05/2019 showed T -2.3  Repeat DEXA 05/2021, likely restart alendronate at that time   Environmental allergies Allergic rhinitis- Allegra OTC, increase H20, allergy hygiene explained.  Medication management -     Magnesium   Future Appointments  Date Time Provider [place]  02/10/2020 11:15 AM Liane Comber, NP [facility] None  05/18/2020 11:30 AM Unk Pinto, MD [facility] None  12/07/2020 11:00 AM Unk Pinto, MD [facility] None     Plan:   During the course of the visit the patient was educated and counseled about appropriate screening and preventive services including:    Pneumococcal vaccine    Influenza vaccine  Td vaccine  Screening electrocardiogram  Screening mammography  Bone densitometry screening  Colorectal cancer screening  Diabetes screening  Glaucoma screening  Nutrition counseling   Advanced directives: given info/requested  Subjective:   Penny [name] is a 86 y.o. female who presents for Medicare Annual Wellness Visit and 3 month follow up on hypertension, prediabetes, hyperlipidemia, vitamin D def.   Putting house on the market in March and going to move into a townhouse. 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 [place] daughter, lives with her 5-6 years ago after divorce and 2 boys come here, at [facility] and one is working, Scientist, physiological he comes here.   Former smoker, quit in 1998. She has hx of RUL lung CA s/p partial lobectomy in 1998. COPD/emphysema noted on CT chest 2016 has been stable without treatment other than stable mild dyspnea with rushing or exertion. She had covid 19 in Jan 2021, CXR from 12/09/2019 showed mild left upper lobe and bibasilar infiltrates, left greater than right. *** follow up?   BMI is There is no height or weight on file to calculate BMI., she has been working on diet and exercise.  Wt Readings from Last 3 Encounters:  12/09/19 136 lb 3.2 oz (61.8 kg)  11/11/19 144 lb 12.8 oz (65.7 kg)  09/17/19 147 lb 9.6 oz (67 kg)   She has diffuse aortic atherosclerosis per CT 10/2014.  Her blood pressure has been controlled at home, today their BP is   She does not workout but is very active and does try to walk.  She denies chest pain, shortness of breath, dizziness.   She is on cholesterol medication, rosuvastatin 5 mg and denies  myalgias. Her cholesterol is at goal. The cholesterol last visit was:   Lab Results  Component Value Date   CHOL 170 11/11/2019   HDL 64 11/11/2019   LDLCALC 89 11/11/2019   TRIG 84 11/11/2019   CHOLHDL 2.7 11/11/2019   She has been working on diet and exercise for prediabetes, and denies paresthesia of the  feet, polydipsia and polyuria. Last A1C in the office was:  Lab Results  Component Value Date   HGBA1C 5.9 (H) 11/11/2019    Last GFR:  Lab Results  Component Value Date   GFRNONAA 66 12/09/2019   Patient is on Vitamin D supplement, *** Lab Results  Component Value Date   VD25OH 113 (H) 11/11/2019       Medication Review Current Outpatient Medications on File Prior to Visit  Medication Sig Dispense Refill  . aspirin 81 MG chewable tablet Chew 81 mg by mouth daily.    . cetirizine (ZYRTEC) 10 MG tablet Takes 1 tablet Daily    . Cholecalciferol 125 MCG (5000 UT) capsule Take 2 capsules Daily 180 capsule 99  . Cyanocobalamin (VITAMIN B-12) 1000 MCG SUBL Takes 1 tab SL Daily  0  . diphenhydramine-acetaminophen (TYLENOL PM) 25-500 MG TABS Take 1 tablet by mouth at bedtime. Take every night per patient    . doxycycline (VIBRAMYCIN) 100 MG capsule Take 1 capsule twice daily with food 20 capsule 0  . hydrochlorothiazide (HYDRODIURIL) 25 MG tablet Take ONE (1) tablet Daily for BP & Fluid Retention 90 tablet 3  . meloxicam (MOBIC) 15 MG tablet Take 1/2 to 1  tablet Daily with Food for Pain & Inflammation 90 tablet 3  . metoprolol tartrate (LOPRESSOR) 50 MG tablet Take 1 tablet 2 x /day for BP 180 tablet 1  . montelukast (SINGULAIR) 10 MG tablet Take 1 tablet daily for Allergies 90 tablet 3  . OVER THE COUNTER MEDICATION     . rosuvastatin (CRESTOR) 5 MG tablet Take 1 tablet (5 mg total) by mouth at bedtime. 90 tablet 3   No current facility-administered medications on file prior to visit.    Current Problems (verified) Patient Active Problem List   Diagnosis Date Noted  . Abnormal glucose 10/09/2018  . Osteoporosis 12/28/2015  . COPD (chronic obstructive pulmonary disease) with emphysema ([place]) 02/25/2015  . Atherosclerosis of aorta ([place]) 02/25/2015  . Essential hypertension   . Hyperlipidemia, mixed   . Vitamin D deficiency   . Personal history of lung cancer 12/04/1996     Immunization History  Administered Date(s) Administered  . Influenza, High Dose Seasonal PF 09/15/2015, 10/02/2017, 07/27/2019  . Influenza-Unspecified 08/04/2012, 09/03/2013, 09/03/2018  . Pneumococcal Conjugate-13 07/21/2014  . Pneumococcal Polysaccharide-23 10/30/2005  . Td 04/03/2013   Preventative care: Last colonoscopy: 2012 done  Last mammogram: 03/2018 Last pap smear/pelvic exam: 2012 negative DEXA: last 05/2019 has been on alendronate since 2016- stopped 2019 - T - 2.7 > -1.9 > - 2.3 Echo 2009 normal EF CXR 12/2019 CT cervical spin 10/2014 CT head 10/20/2014  Prior vaccinations: TD or Tdap: 2014  Influenza: 07/2019 Pneumococcal: 2006 Prevnar 13: 2015 Shingles/Zostavax: declines due to cost  Names of Other Physician/Practitioners you currently use: 1. [facility]- here for primary care 2. Dr. Prudencio Burly, eye doctor, last visit 2019 3. , dentist, last visit 07/2015 4. Dr. Allyson Sabal, derm, 2019, goes annually  Patient Care Team: Unk Pinto, MD as PCP - General (Internal Medicine) Druscilla Brownie, MD as Consulting Physician (Dermatology) Daryll Brod, MD as  Consulting Physician (Orthopedic Surgery) Richmond Campbell, MD as Consulting Physician (Gastroenterology) Nicanor Alcon, MD as Attending Physician (Thoracic Surgery)  Allergies Allergies  Allergen Reactions  . Ace Inhibitors Cough  . Calcium-Containing Compounds Other (See Comments)    constpation  . Lactose Intolerance (Gi) Diarrhea  . Polysporin [Bacitracin-Polymyxin B] Rash    Polysporin or neosporin gave her a rash, couldn't remember which    SURGICAL HISTORY She  has a past surgical history that includes Carpal tunnel release (Right); Shoulder adhesion release (Right); Lung removal, partial (Right); and Olecranon bursectomy (Left, 03/20/2017). FAMILY HISTORY Her family history includes Breast cancer in her paternal aunt; Heart disease in her mother; Lung cancer  in her father. SOCIAL HISTORY She  reports that she quit smoking about 23 years ago. She has never used smokeless tobacco. She reports current alcohol use of about 1.0 standard drinks of alcohol per week. She reports that she does not use drugs.  MEDICARE WELLNESS OBJECTIVES: Physical activity:   Cardiac risk factors:   Depression/mood screen:   Depression screen [facility] 2/9 11/10/2019  Decreased Interest 0  Down, Depressed, Hopeless 0  PHQ - 2 Score 0    ADLs:  In your present state of health, do you have any difficulty performing the following activities: 11/10/2019 05/02/2019  Hearing? N N  Vision? N N  Difficulty concentrating or making decisions? N N  Walking or climbing stairs? N N  Dressing or bathing? N N  Doing errands, shopping? N N  Some recent data might be hidden     Cognitive Testing  Alert? Yes  Normal Appearance?Yes  Oriented to person? Yes  Place? Yes   Time? Yes  Recall of three objects?  Yes  Can perform simple calculations? Yes  Displays appropriate judgment?Yes  Can read the correct time from a watch face?Yes  EOL planning:     Objective:   There were no vitals taken for this visit. There is no height or weight on file to calculate BMI.  General appearance: alert, no distress, WD/WN,  female HEENT: normocephalic, sclerae anicteric, TMs pearly, nares patent, no discharge or erythema, pharynx normal Oral cavity: MMM, no lesions Neck: supple, no lymphadenopathy, no thyromegaly, no masses Heart: RRR, normal S1, S2, no murmurs Lungs: CTA bilaterally, no wheezes, rhonchi, or rales Abdomen: +bs, soft, non tender, non distended, no masses, no hepatomegaly, no splenomegaly Musculoskeletal:  no swelling, no obvious deformity Extremities: no edema, no cyanosis, no clubbing Pulses: 2+ symmetric, upper and lower extremities, normal cap refill Neurological: alert, oriented x 3, CN2-12 intact, strength normal upper extremities and lower extremities, sensation normal  throughout, DTRs 2+ throughout, no cerebellar signs, gait normal Psychiatric: normal affect, behavior normal, pleasant  Breast: defer Gyn: defer Rectal: defer  Medicare Attestation I have personally reviewed: The patient's medical and social history Their use of alcohol, tobacco or illicit drugs Their current medications and supplements The patient's functional ability including ADLs,fall risks, home safety risks, cognitive, and hearing and visual impairment Diet and physical activities Evidence for depression or mood disorders  The patient's weight, height, BMI, and visual acuity have been recorded in the chart.  I have made referrals, counseling, and provided education to the patient based on review of the above and I have provided the patient with a written personalized care plan for preventive services.     Izora Ribas, NP   02/09/2020

## 2020-02-10 ENCOUNTER — Ambulatory Visit: Payer: Self-pay | Admitting: Adult Health

## 2020-02-10 ENCOUNTER — Ambulatory Visit: Payer: Self-pay | Admitting: Physician Assistant

## 2020-02-20 ENCOUNTER — Telehealth: Payer: Self-pay

## 2020-02-20 NOTE — Telephone Encounter (Signed)
Pt called with question about her med list. All questions were addressed & patient voiced understanding of this.

## 2020-03-12 ENCOUNTER — Other Ambulatory Visit: Payer: Self-pay | Admitting: *Deleted

## 2020-03-12 DIAGNOSIS — I10 Essential (primary) hypertension: Secondary | ICD-10-CM

## 2020-03-12 MED ORDER — SIMVASTATIN 40 MG PO TABS: ORAL_TABLET | ORAL | 1 refills | Status: DC

## 2020-03-12 MED ORDER — HYDROCHLOROTHIAZIDE 25 MG PO TABS: ORAL_TABLET | ORAL | 3 refills | Status: DC

## 2020-03-22 DIAGNOSIS — S92501A Displaced unspecified fracture of right lesser toe(s), initial encounter for closed fracture: Secondary | ICD-10-CM | POA: Diagnosis not present

## 2020-03-22 DIAGNOSIS — M79675 Pain in left toe(s): Secondary | ICD-10-CM | POA: Diagnosis not present

## 2020-04-07 DIAGNOSIS — M79672 Pain in left foot: Secondary | ICD-10-CM | POA: Diagnosis not present

## 2020-04-07 DIAGNOSIS — S92515A Nondisplaced fracture of proximal phalanx of left lesser toe(s), initial encounter for closed fracture: Secondary | ICD-10-CM | POA: Diagnosis not present

## 2020-04-21 ENCOUNTER — Other Ambulatory Visit: Payer: Self-pay | Admitting: Internal Medicine

## 2020-04-21 DIAGNOSIS — Z1231 Encounter for screening mammogram for malignant neoplasm of breast: Secondary | ICD-10-CM

## 2020-04-28 DIAGNOSIS — S92912D Unspecified fracture of left toe(s), subsequent encounter for fracture with routine healing: Secondary | ICD-10-CM | POA: Diagnosis not present

## 2020-04-29 NOTE — Progress Notes (Signed)
05/20/2021 due DEXA BCGI

## 2020-05-11 ENCOUNTER — Other Ambulatory Visit: Payer: Self-pay | Admitting: Internal Medicine

## 2020-05-11 DIAGNOSIS — J301 Allergic rhinitis due to pollen: Secondary | ICD-10-CM

## 2020-05-11 MED ORDER — MONTELUKAST SODIUM 10 MG PO TABS: ORAL_TABLET | ORAL | 3 refills | Status: DC

## 2020-05-17 NOTE — Progress Notes (Deleted)
MEDICARE ANNUAL WELLNESS VISIT AND FOLLOW UP  Assessment:   Encounter for annual medicare wellness exam 1 year  Essential hypertension - continue medications, DASH diet, exercise and monitor at home. Call if greater than 130/80.  - can cut HCTZ in half - CBC with Differential/Platelet - BASIC METABOLIC PANEL WITH GFR - Hepatic function panel - TSH   Hyperlipidemia -stop simvastatin and start crestor check lipids, decrease fatty foods, increase activity.  - Lipid panel -     rosuvastatin (CRESTOR) 5 MG tablet; Take 1 tablet (5 mg total) by mouth at bedtime.  Abnormal glucose Discussed general issues about diabetes pathophysiology and management., Educational material distributed., Suggested low cholesterol diet., Encouraged aerobic exercise., Discussed foot care., Reminded to get yearly retinal exam. - Hemoglobin A1c   Vitamin D deficiency - Vit D  25 hydroxy (rtn osteoporosis monitoring)   Medication management - Magnesium  Hx of cancer of lung CXR 10/2018 normal, continue follow up   Osteoporosis Continue vitamin D, Ca, stop alendronate until next year - has been on for 3 years  Environmental allergies Allergic rhinitis- Allegra OTC, increase H20, allergy hygiene explained.  Medication management -     Magnesium  Atherosclerosis of aorta ([facility]) Control blood pressure, cholesterol, glucose, increase exercise.   Pulmonary emphysema, unspecified emphysema type ([place]) Monitor  B12 deficiency -     Iron,Total/Total Iron Binding Cap  Iron deficiency -     Vitamin B12   Future Appointments  Date Time Provider [place]  05/18/2020 11:45 AM Vicie Mutters, PA-C [facility] None  05/26/2020 11:40 AM [facility] 3 [facility]  12/07/2020 11:00 AM Vicie Mutters, PA-C [facility] None     Plan:   During the course of the visit the patient was educated and counseled about appropriate screening and preventive services including:    Pneumococcal  vaccine   Influenza vaccine  Td vaccine  Screening electrocardiogram  Screening mammography  Bone densitometry screening  Colorectal cancer screening  Diabetes screening  Glaucoma screening  Nutrition counseling   Advanced directives: given info/requested  Subjective:   Penny [name] is a 86 y.o. female who presents for Medicare Annual Wellness Visit and 3 month follow up on hypertension, prediabetes, hyperlipidemia, vitamin D def.   Putting house on the market in March and going to move into a townhouse. 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 [place] daughter, lives with her 5-6 years ago after divorce and 2 boys come here, at [facility] and one is working, Scientist, physiological he comes here.   BMI is There is no height or weight on file to calculate BMI., she has been working on diet and exercise.  Wt Readings from Last 3 Encounters:  12/09/19 136 lb 3.2 oz (61.8 kg)  11/11/19 144 lb 12.8 oz (65.7 kg)  09/17/19 147 lb 9.6 oz (67 kg)   Her blood pressure has been controlled at home, today their BP is   She does not workout but is very active and does try to walk.  She denies chest pain, shortness of breath, dizziness.   She is on cholesterol medication, simvastatin 40 and denies myalgias. Her cholesterol is at goal. The cholesterol last visit was:   Lab Results  Component Value Date   CHOL 170 11/11/2019   HDL 64 11/11/2019   LDLCALC 89 11/11/2019   TRIG 84 11/11/2019   CHOLHDL 2.7 11/11/2019   She has been working on diet and exercise for prediabetes, and denies paresthesia of the feet, polydipsia and  polyuria. Last A1C in the office was:  Lab Results  Component Value Date   HGBA1C 5.9 (H) 11/11/2019   Patient is on Vitamin D supplement. Lab Results  Component Value Date   VD25OH 113 (H) 11/11/2019      She is on prevagin and memory pills, states she will put things places and can not remember where she puts it. Has a hard time remembering names. She has no issues with sleeping,  she wakes up resting well. No numbness/tingling in hands or feet.  Lab Results  Component Value Date   VITAMINB12 340 01/29/2019    Medication Review   Current Outpatient Medications (Cardiovascular):  .  hydrochlorothiazide (HYDRODIURIL) 25 MG tablet, Take ONE (1) tablet Daily for BP & Fluid Retention .  metoprolol tartrate (LOPRESSOR) 50 MG tablet, Take 1 tablet 2 x /day for BP .  simvastatin (ZOCOR) 40 MG tablet, Takes 1 tablet every night for cholesterol.  Current Outpatient Medications (Respiratory):  .  cetirizine (ZYRTEC) 10 MG tablet, Takes 1 tablet Daily .  montelukast (SINGULAIR) 10 MG tablet, Take 1 tablet daily for Allergies  Current Outpatient Medications (Analgesics):  .  aspirin 81 MG chewable tablet, Chew 81 mg by mouth daily. .  meloxicam (MOBIC) 15 MG tablet, Take 1/2 to 1  tablet Daily with Food for Pain & Inflammation  Current Outpatient Medications (Hematological):  Marland Kitchen  Cyanocobalamin (VITAMIN B-12) 1000 MCG SUBL, Takes 1 tab SL Daily  Current Outpatient Medications (Other):  Marland Kitchen  Cholecalciferol 125 MCG (5000 UT) capsule, Take 2 capsules Daily .  diphenhydramine-acetaminophen (TYLENOL PM) 25-500 MG TABS, Take 1 tablet by mouth at bedtime. Take every night per patient .  doxycycline (VIBRAMYCIN) 100 MG capsule, Take 1 capsule twice daily with food .  OVER THE COUNTER MEDICATION,   Current Problems (verified) Patient Active Problem List   Diagnosis Date Noted  . Abnormal glucose 10/09/2018  . Osteoporosis 12/28/2015  . COPD (chronic obstructive pulmonary disease) with emphysema ([place]) 02/25/2015  . Atherosclerosis of aorta ([place]) 02/25/2015  . Essential hypertension   . Hyperlipidemia, mixed   . Vitamin D deficiency   . Personal history of lung cancer 12/04/1996    Immunization History  Administered Date(s) Administered  . Influenza, High Dose Seasonal PF 09/15/2015, 10/02/2017, 07/27/2019  . Influenza-Unspecified 08/04/2012, 09/03/2013, 09/03/2018  .  Pneumococcal Conjugate-13 07/21/2014  . Pneumococcal Polysaccharide-23 10/30/2005  . Td 04/03/2013   Health Maintenance  Topic Date Due  . COVID-19 Vaccine (1) Never done  . INFLUENZA VACCINE  07/04/2020  . TETANUS/TDAP  04/04/2023  . DEXA SCAN  Completed  . PNA vac Low Risk Adult  Completed    Preventative care: Last colonoscopy: 2012 done  Last mammogram: 03/2018 Last pap smear/pelvic exam: 2012 negative DEXA: 02/2017, has been on alendronate since 2016- stopped 2019- will repeat this year with [facility] Echo 2009 normal EF CXR 2019 CT cervical spin 10/2014 CT head 10/20/2014  Prior vaccinations: TD or Tdap: 2014  Influenza: 2016 Pneumococcal: 2006 Prevnar 13: 2015 Shingles/Zostavax: declines due to cost  Names of Other Physician/Practitioners you currently use: 1. [facility]- here for primary care 2. Dr. Prudencio Burly, eye doctor, last visit 2019 3. , dentist, last visit 07/2015 4. Dr. Allyson Sabal, derm, 2019, goes annually  Patient Care Team: Unk Pinto, MD as PCP - General (Internal Medicine) Druscilla Brownie, MD as Consulting Physician (Dermatology) Daryll Brod, MD as Consulting Physician (Orthopedic Surgery) Richmond Campbell, MD as Consulting Physician (Gastroenterology) Nicanor Alcon, MD  as Attending Physician (Thoracic Surgery)  Allergies Allergies  Allergen Reactions  . Ace Inhibitors Cough  . Calcium-Containing Compounds Other (See Comments)    constpation  . Lactose Intolerance (Gi) Diarrhea  . Polysporin [Bacitracin-Polymyxin B] Rash    Polysporin or neosporin gave her a rash, couldn't remember which    SURGICAL HISTORY She  has a past surgical history that includes Carpal tunnel release (Right); Shoulder adhesion release (Right); Lung removal, partial (Right); and Olecranon bursectomy (Left, 03/20/2017). FAMILY HISTORY Her family history includes Breast cancer in her paternal aunt; Heart disease in her mother; Lung  cancer in her father. SOCIAL HISTORY She  reports that she quit smoking about 23 years ago. She has never used smokeless tobacco. She reports current alcohol use of about 1.0 standard drink of alcohol per week. She reports that she does not use drugs.  MEDICARE WELLNESS OBJECTIVES: Hearing test scheduled today- [facility] Physical activity:   Cardiac risk factors:   Depression/mood screen:   Depression screen [facility] 2/9 11/10/2019  Decreased Interest 0  Down, Depressed, Hopeless 0  PHQ - 2 Score 0    ADLs:  In your present state of health, do you have any difficulty performing the following activities: 11/10/2019  Hearing? N  Vision? N  Difficulty concentrating or making decisions? N  Walking or climbing stairs? N  Dressing or bathing? N  Doing errands, shopping? N  Some recent data might be hidden     Cognitive Testing  Alert? Yes  Normal Appearance?Yes  Oriented to person? Yes  Place? Yes   Time? Yes  Recall of three objects?  Yes  Can perform simple calculations? Yes  Displays appropriate judgment?Yes  Can read the correct time from a watch face?Yes  EOL planning:     Objective:   There were no vitals taken for this visit. There is no height or weight on file to calculate BMI.  General appearance: alert, no distress, WD/WN,  female HEENT: normocephalic, sclerae anicteric, TMs pearly, nares patent, no discharge or erythema, pharynx normal Oral cavity: MMM, no lesions Neck: supple, no lymphadenopathy, no thyromegaly, no masses Heart: RRR, normal S1, S2, no murmurs Lungs: CTA bilaterally, no wheezes, rhonchi, or rales Abdomen: +bs, soft, non tender, non distended, no masses, no hepatomegaly, no splenomegaly Musculoskeletal:  no swelling, no obvious deformity Extremities: no edema, no cyanosis, no clubbing Pulses: 2+ symmetric, upper and lower extremities, normal cap refill Neurological: alert, oriented x 3, CN2-12 intact, strength normal upper  extremities and lower extremities, sensation normal throughout, DTRs 2+ throughout, no cerebellar signs, gait normal Psychiatric: normal affect, behavior normal, pleasant   Medicare Attestation I have personally reviewed: The patient's medical and social history Their use of alcohol, tobacco or illicit drugs Their current medications and supplements The patient's functional ability including ADLs,fall risks, home safety risks, cognitive, and hearing and visual impairment Diet and physical activities Evidence for depression or mood disorders  The patient's weight, height, BMI, and visual acuity have been recorded in the chart.  I have made referrals, counseling, and provided education to the patient based on review of the above and I have provided the patient with a written personalized care plan for preventive services.     Vicie Mutters, PA-C   05/17/2020

## 2020-05-18 ENCOUNTER — Ambulatory Visit: Payer: Medicare Other | Admitting: Physician Assistant

## 2020-05-26 ENCOUNTER — Ambulatory Visit
Admission: RE | Admit: 2020-05-26 | Discharge: 2020-05-26 | Disposition: A | Discharge status: Home / Self Care | Payer: Medicare Other | Source: Ambulatory Visit | Attending: Internal Medicine | Admitting: Internal Medicine

## 2020-05-26 ENCOUNTER — Other Ambulatory Visit: Payer: Self-pay

## 2020-05-26 DIAGNOSIS — Z1231 Encounter for screening mammogram for malignant neoplasm of breast: Secondary | ICD-10-CM

## 2020-05-31 DIAGNOSIS — M79672 Pain in left foot: Secondary | ICD-10-CM | POA: Diagnosis not present

## 2020-05-31 DIAGNOSIS — S92912D Unspecified fracture of left toe(s), subsequent encounter for fracture with routine healing: Secondary | ICD-10-CM | POA: Diagnosis not present

## 2020-06-10 ENCOUNTER — Other Ambulatory Visit: Payer: Self-pay | Admitting: *Deleted

## 2020-06-10 DIAGNOSIS — I10 Essential (primary) hypertension: Secondary | ICD-10-CM

## 2020-06-10 MED ORDER — METOPROLOL TARTRATE 50 MG PO TABS: ORAL_TABLET | ORAL | 1 refills | Status: DC

## 2020-06-23 NOTE — Progress Notes (Signed)
MEDICARE ANNUAL WELLNESS VISIT AND FOLLOW UP  Assessment:   Encounter for annual medicare wellness exam 1 year  Essential hypertension - continue medications, DASH diet, exercise and monitor at home. Call if greater than 130/80.  - CBC with Differential/Platelet - BASIC METABOLIC PANEL WITH GFR - Hepatic function panel - TSH   Hyperlipidemia -continue simvastatin and could not tolerate crestor  -check lipids, decrease fatty foods, increase activity.  - Lipid panel  Abnormal glucose Discussed general issues about diabetes pathophysiology and management., Educational material distributed., Suggested low cholesterol diet., Encouraged aerobic exercise., Discussed foot care., Reminded to get yearly retinal exam. - Hemoglobin A1c   Vitamin D deficiency - Vit D  25 hydroxy (rtn osteoporosis monitoring)   Medication management - Magnesium   Atherosclerosis of aorta Control blood pressure, cholesterol, glucose, increase exercise.   Hx of cancer of lung CXR 12/2019, continue follow up   Osteoporosis Continue vitamin D, Ca, will restart fosamax 2021- has been off  and get DEXA 2022  Environmental allergies Allergic rhinitis- Allegra OTC, increase H20, allergy hygiene explained.  B12 deficiency -     Iron,Total/Total Iron Binding Cap  Iron deficiency -     Vitamin B12  Medication management -     Magnesium  Atherosclerosis of aorta (HCC) Control blood pressure, cholesterol, glucose, increase exercise.   Pulmonary emphysema, unspecified emphysema type ([place]) Monitor- due CXR 12/2020  Basal cell right hand Will schedule removal Dr. Melford Aase   Future Appointments  Date Time Provider [place]  12/07/2020 11:00 AM Vicie Mutters, PA-C [id number] None     Plan:   During the course of the visit the patient was educated and counseled about appropriate screening and preventive services including:    Pneumococcal vaccine   Influenza vaccine  Td  vaccine  Screening electrocardiogram  Screening mammography  Bone densitometry screening  Colorectal cancer screening  Diabetes screening  Glaucoma screening  Nutrition counseling   Advanced directives: given info/requested  Subjective:   Penny [name] is a 86 y.o. female who presents for Medicare Annual Wellness Visit and 3 month follow up on hypertension, prediabetes, hyperlipidemia, vitamin D def.   She had COVID in Jan with post Covid pneumonia, she feels she is completely recovered, she is in her new townhouse. [place] daughter, lives with her 5-6 years ago after divorce and 2 boys come here, at [facility] and one is working, Scientist, physiological he comes here.   BMI is Body mass index is 25.97 kg/m., she has been working on diet and exercise.  Wt Readings from Last 3 Encounters:  06/28/20 142 lb (64.4 kg)  12/09/19 136 lb 3.2 oz (61.8 kg)  11/11/19 144 lb 12.8 oz (65.7 kg)   Her blood pressure has been controlled at home, today their BP is BP: 128/76 She does not workout but is very active and does try to walk.  She denies chest pain, shortness of breath, dizziness.   She is on cholesterol medication, simvastatin 40 and denies myalgias. Her cholesterol is at goal. The cholesterol last visit was:   Lab Results  Component Value Date   CHOL 170 11/11/2019   HDL 64 11/11/2019   LDLCALC 89 11/11/2019   TRIG 84 11/11/2019   CHOLHDL 2.7 11/11/2019   She has been working on diet and exercise for prediabetes, and denies paresthesia of the feet, polydipsia and polyuria. Last A1C in the office was:  Lab Results  Component Value Date   HGBA1C 5.9 (H) 11/11/2019   Patient is on  Vitamin D supplement. Lab Results  Component Value Date   VD25OH 113 (H) 11/11/2019      She is on prevagin and memory pills, states she will put things places and can not remember where she puts it. Has a hard time remembering names. She has no issues with sleeping, she wakes up resting well. No numbness/tingling  in hands or feet. Has not had B12 checked.  Lab Results  Component Value Date   VITAMINB12 340 01/29/2019    Medication Review Current Outpatient Medications on File Prior to Visit  Medication Sig Dispense Refill  . aspirin 81 MG chewable tablet Chew 81 mg by mouth daily.    . cetirizine (ZYRTEC) 10 MG tablet Takes 1 tablet Daily    . Cholecalciferol 125 MCG (5000 UT) capsule Take 2 capsules Daily 180 capsule 99  . Cyanocobalamin (VITAMIN B-12) 1000 MCG SUBL Takes 1 tab SL Daily  0  . diphenhydramine-acetaminophen (TYLENOL PM) 25-500 MG TABS Take 1 tablet by mouth at bedtime. Take every night per patient    . doxycycline (VIBRAMYCIN) 100 MG capsule Take 1 capsule twice daily with food 20 capsule 0  . hydrochlorothiazide (HYDRODIURIL) 25 MG tablet Take ONE (1) tablet Daily for BP & Fluid Retention 90 tablet 3  . meloxicam (MOBIC) 15 MG tablet Take 1/2 to 1  tablet Daily with Food for Pain & Inflammation 90 tablet 3  . metoprolol tartrate (LOPRESSOR) 50 MG tablet Take 1 tablet 2 x /day for BP 180 tablet 1  . montelukast (SINGULAIR) 10 MG tablet Take 1 tablet daily for Allergies 90 tablet 3  . OVER THE COUNTER MEDICATION     . simvastatin (ZOCOR) 40 MG tablet Takes 1 tablet every night for cholesterol. 90 tablet 1   No current facility-administered medications on file prior to visit.    Current Problems (verified) Patient Active Problem List   Diagnosis Date Noted  . B12 deficiency 06/28/2020  . Abnormal glucose 10/09/2018  . Osteoporosis 12/28/2015  . COPD (chronic obstructive pulmonary disease) with emphysema ([place]) 02/25/2015  . Atherosclerosis of aorta ([place]) 02/25/2015  . Essential hypertension   . Hyperlipidemia, mixed   . Vitamin D deficiency   . Personal history of lung cancer 12/04/1996    Immunization History  Administered Date(s) Administered  . Influenza, High Dose Seasonal PF 09/15/2015, 10/02/2017, 07/27/2019  . Influenza-Unspecified 08/04/2012, 09/03/2013,  09/03/2018  . PFIZER SARS-COV-2 Vaccination 01/26/2020, 02/16/2020  . Pneumococcal Conjugate-13 07/21/2014  . Pneumococcal Polysaccharide-23 10/30/2005  . Td 04/03/2013   Preventative care: Last colonoscopy: 2012 done  Last mammogram: 05/2020 Last pap smear/pelvic exam: 2012 negative DEXA: 05/2019, has been on alendronate since 2016- stopped 2019- -2.3 Echo 2009 normal EF CXR 2021 CT cervical spin 10/2014 CT head 10/20/2014  Names of Other Physician/Practitioners you currently use: 1. [facility]- here for primary care 2. Dr. Prudencio Burly, eye doctor, last visit 2020 3. , dentist, last visit 2020 4. Dr. Allyson Sabal, derm, 2019, goes annually  Patient Care Team: Unk Pinto, MD as PCP - General (Internal Medicine) Druscilla Brownie, MD as Consulting Physician (Dermatology) Daryll Brod, MD as Consulting Physician (Orthopedic Surgery) Richmond Campbell, MD as Consulting Physician (Gastroenterology) Nicanor Alcon, MD as Attending Physician (Thoracic Surgery)  Allergies Allergies  Allergen Reactions  . Ace Inhibitors Cough  . Calcium-Containing Compounds Other (See Comments)    constpation  . Lactose Intolerance (Gi) Diarrhea  . Polysporin [Bacitracin-Polymyxin B] Rash    Polysporin or neosporin gave her a rash,  couldn't remember which    SURGICAL HISTORY She  has a past surgical history that includes Carpal tunnel release (Right); Shoulder adhesion release (Right); Lung removal, partial (Right); and Olecranon bursectomy (Left, 03/20/2017). FAMILY HISTORY Her family history includes Breast cancer in her paternal aunt; Heart disease in her mother; Lung cancer in her father. SOCIAL HISTORY She  reports that she quit smoking about 23 years ago. She has never used smokeless tobacco. She reports current alcohol use of about 1.0 standard drink of alcohol per week. She reports that she does not use drugs.  MEDICARE WELLNESS OBJECTIVES: Physical  activity: Current Exercise Habits: The patient does not participate in regular exercise at present (she moves around a lot and does a lot of house work) Cardiac risk factors: Cardiac Risk Factors include: advanced age (>74men, >68 women);dyslipidemia;hypertension;sedentary lifestyle Depression/mood screen:   Depression screen [facility] 2/9 06/28/2020  Decreased Interest 0  Down, Depressed, Hopeless 0  PHQ - 2 Score 0    ADLs:  In your present state of health, do you have any difficulty performing the following activities: 06/28/2020 11/10/2019  Hearing? Y N  Comment has hearing aids -  Vision? N N  Difficulty concentrating or making decisions? N N  Comment has imroved with B12 def -  Walking or climbing stairs? N N  Dressing or bathing? N N  Doing errands, shopping? N N  Some recent data might be hidden     Cognitive Testing  Alert? Yes  Normal Appearance?Yes  Oriented to person? Yes  Place? Yes   Time? Yes  Recall of three objects?  Yes  Can perform simple calculations? Yes  Displays appropriate judgment?Yes  Can read the correct time from a watch face?Yes  EOL planning: Does Patient Have a Medical Advance Directive?: Yes Type of Advance Directive: [place] will Does patient want to make changes to medical advance directive?: No - Patient declined   Objective:   Blood pressure 128/76, pulse 63, temperature 98.1 F (36.7 C), resp. rate 12, height 5\' 2"  (1.575 m), weight 142 lb (64.4 kg), SpO2 96 %. Body mass index is 25.97 kg/m.  General appearance: alert, no distress, WD/WN,  female HEENT: normocephalic, sclerae anicteric, TMs pearly, nares patent, no discharge or erythema, pharynx normal Oral cavity: MMM, no lesions Neck: supple, no lymphadenopathy, no thyromegaly, no masses Heart: RRR, normal S1, S2, no murmurs Lungs: CTA bilaterally, no wheezes, rhonchi, or rales Abdomen: +bs, soft, non tender, non distended, no masses, no hepatomegaly, no  splenomegaly Musculoskeletal:  no swelling, no obvious deformity Extremities: no edema, no cyanosis, no clubbing Pulses: 2+ symmetric, upper and lower extremities, normal cap refill Neurological: alert, oriented x 3, CN2-12 intact, strength normal upper extremities and lower extremities, sensation normal throughout, DTRs 2+ throughout, no cerebellar signs, gait normal Psychiatric: normal affect, behavior normal, pleasant  Breast: defer Gyn: defer Rectal: defer  Medicare Attestation I have personally reviewed: The patient's medical and social history Their use of alcohol, tobacco or illicit drugs Their current medications and supplements The patient's functional ability including ADLs,fall risks, home safety risks, cognitive, and hearing and visual impairment Diet and physical activities Evidence for depression or mood disorders  The patient's weight, height, BMI, and visual acuity have been recorded in the chart.  I have made referrals, counseling, and provided education to the patient based on review of the above and I have provided the patient with a written personalized care plan for preventive services.     Vicie Mutters,  PA-C   06/28/2020

## 2020-06-28 ENCOUNTER — Encounter: Payer: Self-pay | Admitting: Physician Assistant

## 2020-06-28 ENCOUNTER — Other Ambulatory Visit: Payer: Self-pay

## 2020-06-28 ENCOUNTER — Ambulatory Visit (INDEPENDENT_AMBULATORY_CARE_PROVIDER_SITE_OTHER): Payer: Medicare Other | Admitting: Physician Assistant

## 2020-06-28 VITALS — BP 128/76 | HR 63 | Temp 98.1°F | Resp 12 | Ht 62.0 in | Wt 142.0 lb

## 2020-06-28 DIAGNOSIS — I10 Essential (primary) hypertension: Secondary | ICD-10-CM | POA: Diagnosis not present

## 2020-06-28 DIAGNOSIS — J439 Emphysema, unspecified: Secondary | ICD-10-CM

## 2020-06-28 DIAGNOSIS — R6889 Other general symptoms and signs: Secondary | ICD-10-CM | POA: Diagnosis not present

## 2020-06-28 DIAGNOSIS — Z79899 Other long term (current) drug therapy: Secondary | ICD-10-CM

## 2020-06-28 DIAGNOSIS — E538 Deficiency of other specified B group vitamins: Secondary | ICD-10-CM | POA: Diagnosis not present

## 2020-06-28 DIAGNOSIS — E782 Mixed hyperlipidemia: Secondary | ICD-10-CM

## 2020-06-28 DIAGNOSIS — M810 Age-related osteoporosis without current pathological fracture: Secondary | ICD-10-CM

## 2020-06-28 DIAGNOSIS — Z0001 Encounter for general adult medical examination with abnormal findings: Secondary | ICD-10-CM | POA: Diagnosis not present

## 2020-06-28 DIAGNOSIS — Z85118 Personal history of other malignant neoplasm of bronchus and lung: Secondary | ICD-10-CM

## 2020-06-28 DIAGNOSIS — R7309 Other abnormal glucose: Secondary | ICD-10-CM | POA: Diagnosis not present

## 2020-06-28 DIAGNOSIS — E559 Vitamin D deficiency, unspecified: Secondary | ICD-10-CM

## 2020-06-28 DIAGNOSIS — I700 Atherosclerosis of aorta: Secondary | ICD-10-CM | POA: Diagnosis not present

## 2020-06-28 DIAGNOSIS — Z0000 Encounter for general adult medical examination without abnormal findings: Secondary | ICD-10-CM

## 2020-06-28 MED ORDER — ALENDRONATE SODIUM 70 MG PO TABS: 70.0000 mg | ORAL_TABLET | ORAL | 3 refills | Status: DC

## 2020-06-28 NOTE — Patient Instructions (Addendum)
Use a dropper or use a cap to put peroxide, olive oil,mineral oil or canola oil in the effected ear- 2-3 times a week. Let it soak for 20-30 min then you can take a shower or use a baby bulb with warm water to wash out the ear wax.  Can buy debrox wax removal kit over the counter.  Do not use Qtips  Breaking a bone can be serious, especially if the bone is in the hip. People who break a hip sometimes lose the ability to walk on their own. Many of them end up in a nursing home. That's why it is so important to avoid breaking a bone in the first place.   What can I do to keep my bones as healthy as possible? - You can: -Eat foods with a lot of calcium, such as milk, yogurt, and green leafy vegetables  -Eat foods with a lot of vitamin D, such as milk that has vitamin D added, and fish from the ocean  -Take calcium and vitamin D pills (if you do not get enough from the food that you eat) -Be active for at least 30 minutes, most days of the week -Avoid smoking  -Limit the amount of alcohol you drink to 1 to 2 drinks a day at most  What do osteoporosis medicines do? - If you have osteoporosis or a high risk of breaking a bone, the medicines your doctor prescribes can: -Reduce bone loss  -Increase bone density or keep it about the same -Reduce the chances that you will break a bone   Bisphosphonates - Most people being treated for osteoporosis take these medicines first. If they do not work well      enough or cause side effects that are too hard to handle, there are other options.   Most people take one pill every week. If your doctor prescribes a bisphosphonate pill, you must take the medicine exactly as directed. If you don't, the medicine can irritate your throat or stomach. For most bisphosphonate pills, you must:  -Take the pill first thing in the morning, before you have anything to eat or drink.  -Drink an 8-ounce glass of water with the pill, but not eat or drink anything else for 30 minutes  or 1 hour (depending on which pill you take).  -Avoid lying down for 30 minutes after taking the pill. (You must sit or stand during that time).     Memory Compensation Strategies  1. Use "WARM" strategy.  W= write it down  A= associate it  R= repeat it  M= make a mental note  2.   You can keep a Social worker.  Use a 3-ring notebook with sections for the following: calendar, important names and phone numbers,  medications, doctors' names/phone numbers, lists/reminders, and a section to journal what you did  each day.   3.    Use a calendar to write appointments down.  4.    Write yourself a schedule for the day.  This can be placed on the calendar or in a separate section of the Memory Notebook.  Keeping a  regular schedule can help memory.  5.    Use medication organizer with sections for each day or morning/evening pills.  You may need help loading it  6.    Keep a basket, or pegboard by the door.  Place items that you need to take out with you in the basket or on the pegboard.  You may also want  to  include a message board for reminders.  7.    Use sticky notes.  Place sticky notes with reminders in a place where the task is performed.  For example: " turn off the  stove" placed by the stove, "lock the door" placed on the door at eye level, " take your medications" on  the bathroom mirror or by the place where you normally take your medications.  8.    Use alarms/timers.  Use while cooking to remind yourself to check on food or as a reminder to take your medicine, or as a  reminder to make a call, or as a reminder to perform another task, etc.

## 2020-06-29 LAB — COMPLETE METABOLIC PANEL WITH GFR
AG Ratio: 1.6 (calc) (ref 1.0–2.5)
ALT: 12 U/L (ref 6–29)
AST: 22 U/L (ref 10–35)
Albumin: 4.1 g/dL (ref 3.6–5.1)
Alkaline phosphatase (APISO): 66 U/L (ref 37–153)
BUN: 18 mg/dL (ref 7–25)
CO2: 33 mmol/L — ABNORMAL HIGH (ref 20–32)
Calcium: 9.5 mg/dL (ref 8.6–10.4)
Chloride: 104 mmol/L (ref 98–110)
Creat: 0.78 mg/dL (ref 0.60–0.88)
GFR, Est African American: 80 mL/min/{1.73_m2} (ref >=60)
GFR, Est Non African American: 69 mL/min/{1.73_m2} (ref >=60)
Globulin: 2.6 g/dL (calc) (ref 1.9–3.7)
Glucose, Bld: 85 mg/dL (ref 65–99)
Potassium: 4 mmol/L (ref 3.5–5.3)
Sodium: 142 mmol/L (ref 135–146)
Total Bilirubin: 0.5 mg/dL (ref 0.2–1.2)
Total Protein: 6.7 g/dL (ref 6.1–8.1)

## 2020-06-29 LAB — CBC WITH DIFFERENTIAL/PLATELET
Absolute Monocytes: 495 cells/uL (ref 200–950)
Basophils Absolute: 20 cells/uL (ref 0–200)
Basophils Relative: 0.4 %
Eosinophils Absolute: 98 cells/uL (ref 15–500)
Eosinophils Relative: 2 %
HCT: 39.3 % (ref 35.0–45.0)
Hemoglobin: 12.6 g/dL (ref 11.7–15.5)
Lymphs Abs: 1470 cells/uL (ref 850–3900)
MCH: 28.1 pg (ref 27.0–33.0)
MCHC: 32.1 g/dL (ref 32.0–36.0)
MCV: 87.7 fL (ref 80.0–100.0)
MPV: 10.5 fL (ref 7.5–12.5)
Monocytes Relative: 10.1 %
Neutro Abs: 2818 cells/uL (ref 1500–7800)
Neutrophils Relative %: 57.5 %
Platelets: 163 10*3/uL (ref 140–400)
RBC: 4.48 10*6/uL (ref 3.80–5.10)
RDW: 13.1 % (ref 11.0–15.0)
Total Lymphocyte: 30 %
WBC: 4.9 10*3/uL (ref 3.8–10.8)

## 2020-06-29 LAB — MAGNESIUM: Magnesium: 1.6 mg/dL (ref 1.5–2.5)

## 2020-06-29 LAB — LIPID PANEL
Cholesterol: 173 mg/dL (ref <200)
HDL: 63 mg/dL (ref >=50)
LDL Cholesterol (Calc): 92 mg/dL (calc)
Non-HDL Cholesterol (Calc): 110 mg/dL (calc) (ref <130)
Total CHOL/HDL Ratio: 2.7 (calc) (ref <5.0)
Triglycerides: 87 mg/dL (ref <150)

## 2020-06-29 LAB — VITAMIN D 25 HYDROXY (VIT D DEFICIENCY, FRACTURES): Vit D, 25-Hydroxy: 91 ng/mL (ref 30–100)

## 2020-06-29 LAB — HEMOGLOBIN A1C
Hgb A1c MFr Bld: 5.5 % of total Hgb (ref <5.7)
Mean Plasma Glucose: 111 (calc)
eAG (mmol/L): 6.2 (calc)

## 2020-06-29 LAB — VITAMIN B12: Vitamin B-12: >2000 pg/mL — ABNORMAL HIGH (ref 200–1100)

## 2020-06-29 LAB — TSH: TSH: 1.32 mIU/L (ref 0.40–4.50)

## 2020-07-02 IMAGING — MG DIGITAL SCREENING BILAT W/ TOMO W/ CAD
8 series · 9 of 24 positions shown · non-contrast
Comparison: Previous exam(s).

CLINICAL DATA: Screening.

EXAM:
DIGITAL SCREENING BILATERAL MAMMOGRAM WITH TOMO AND CAD

[L MLO synth-2D]
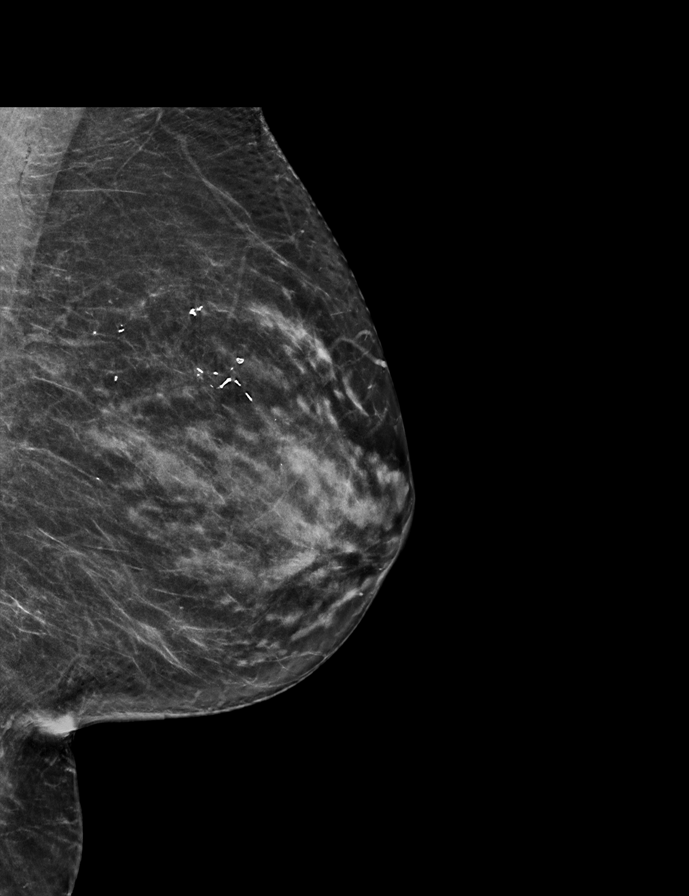

[L CC synth-2D]
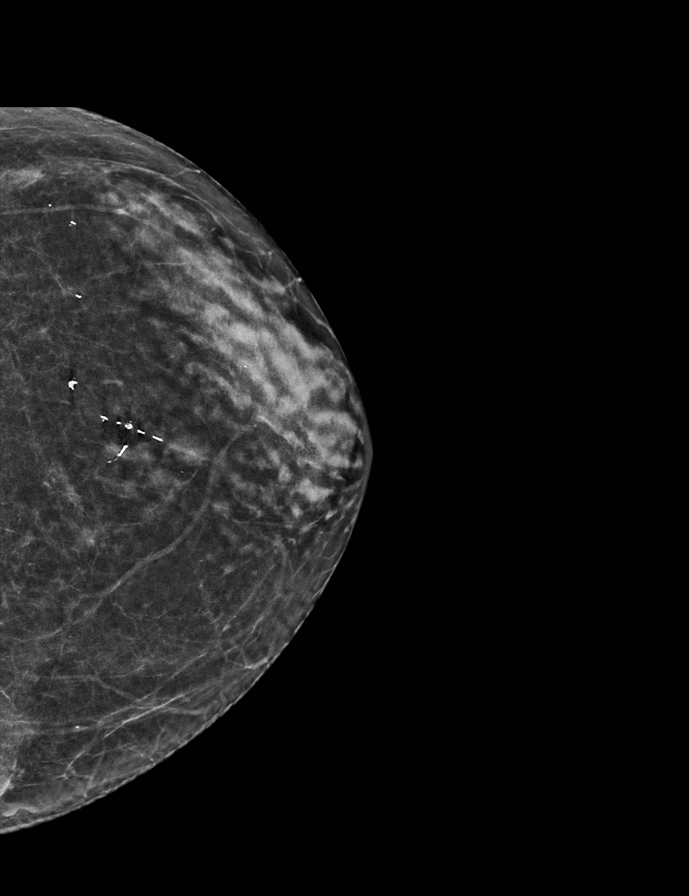

[R MLO synth-2D]
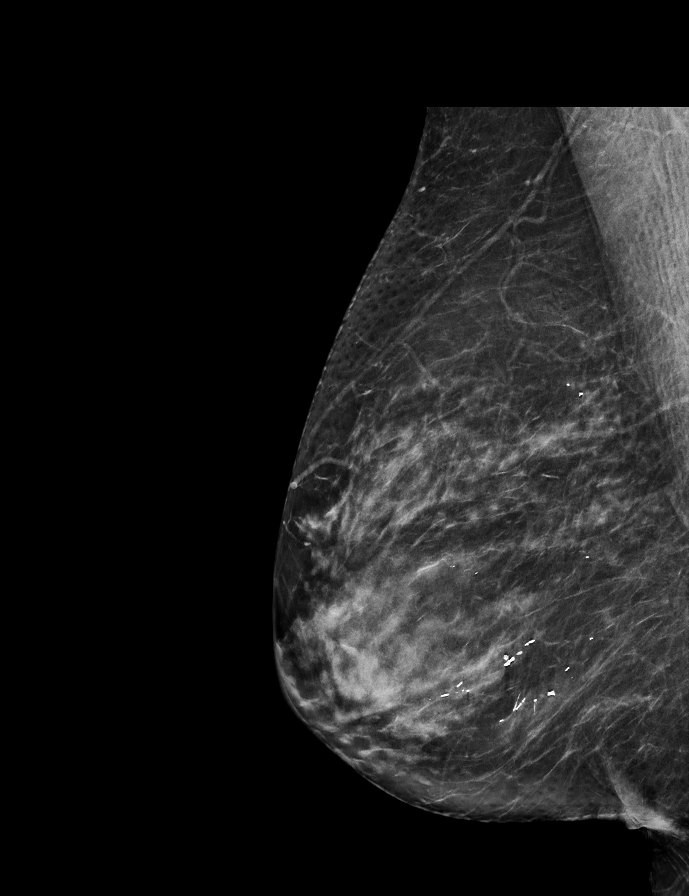

[R CC synth-2D]
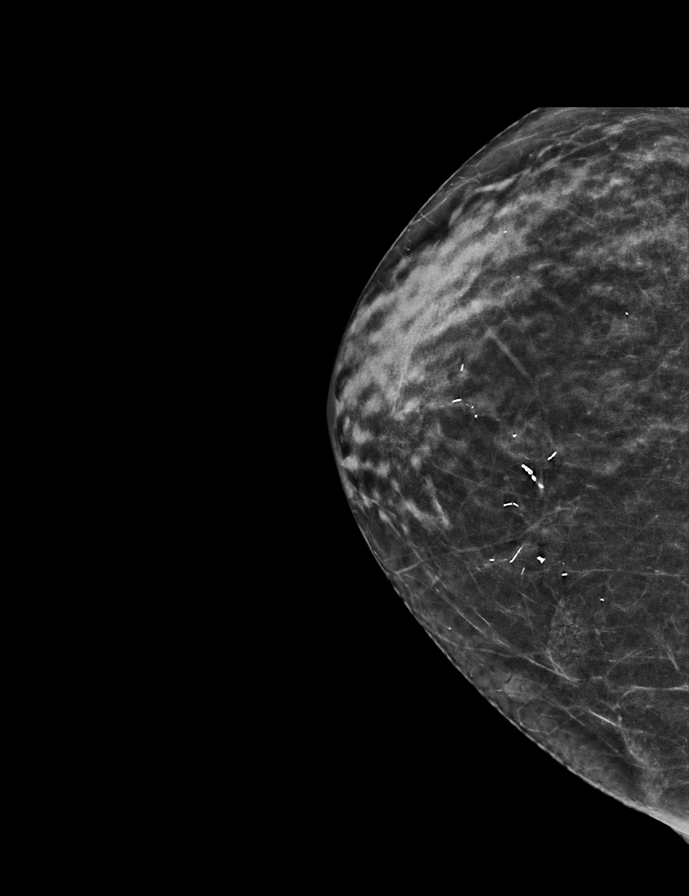

[R CC tomo · 2 of 53 frames shown]
[frame 18/53]
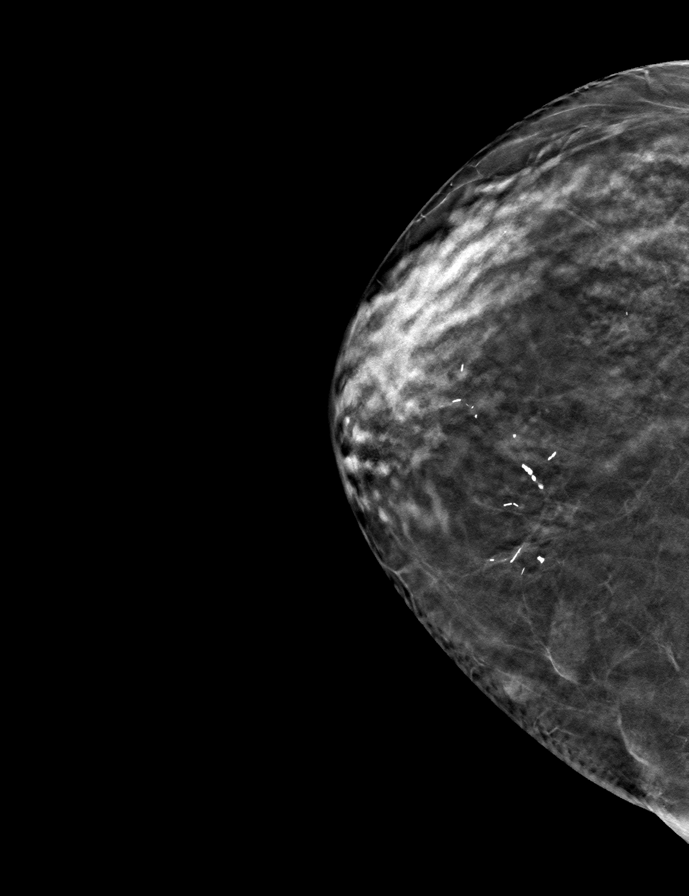
[frame 27/53]
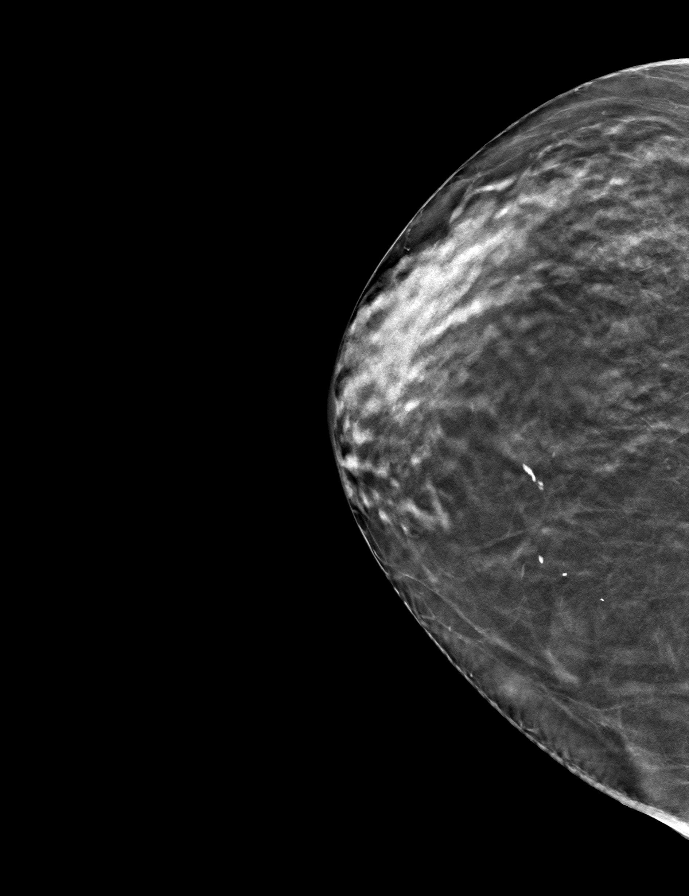

[L CC tomo · tomo slice 27/53.0]
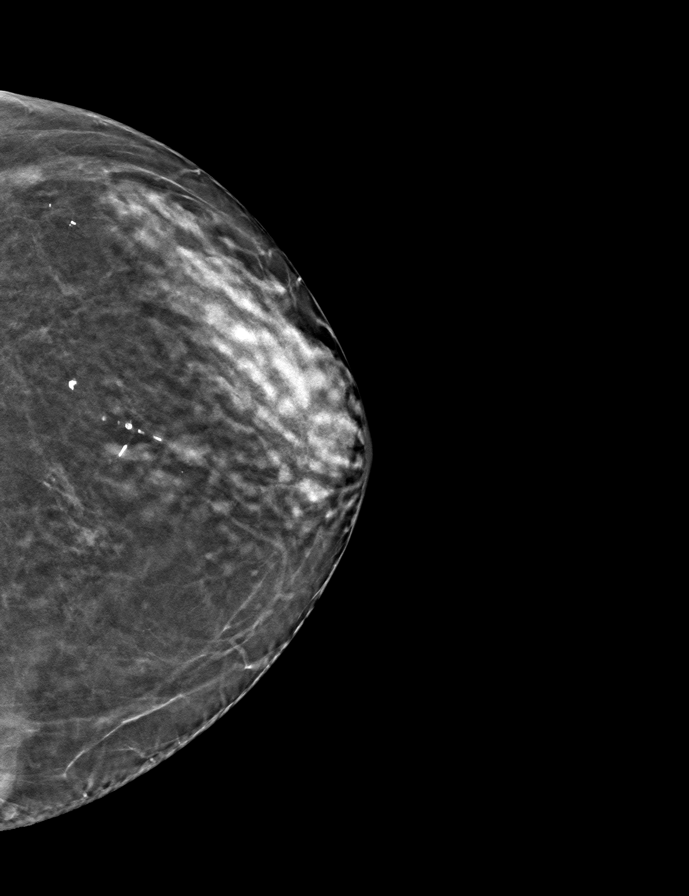

[R MLO tomo · tomo slice 33/66.0]
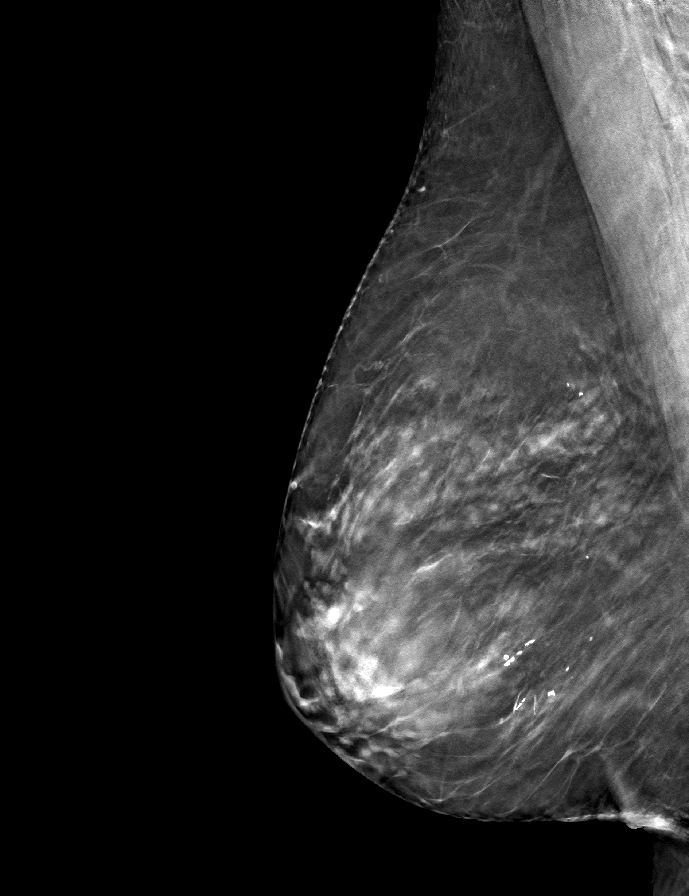

[L MLO tomo · tomo slice 30/59.0]
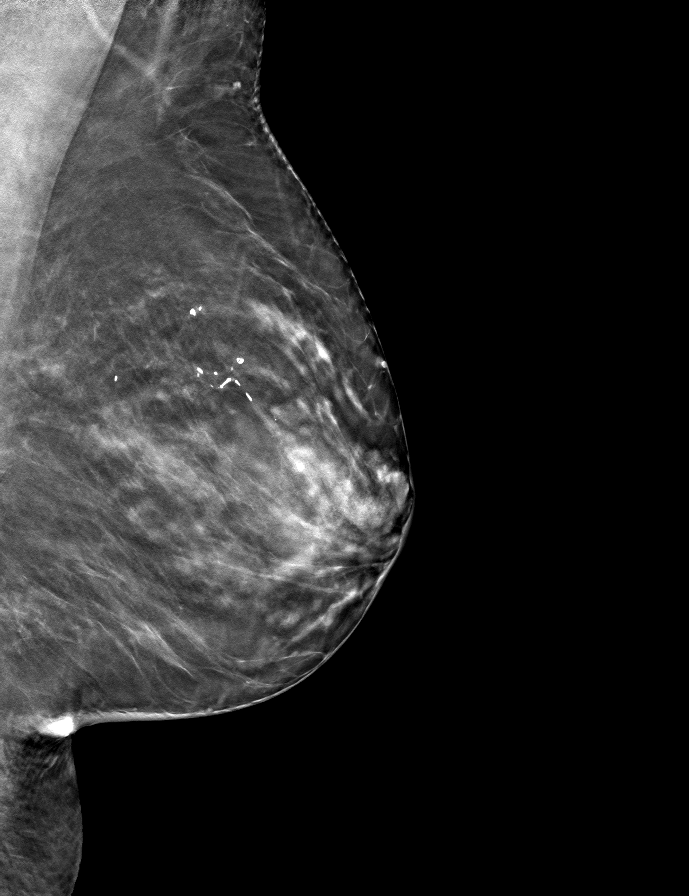

[9 of 24 positions shown; findings below may reference images not displayed]

ACR Breast Density Category c: The breast tissue is heterogeneously
dense, which may obscure small masses.
FINDINGS: There are no findings suspicious for malignancy. Images were
processed with CAD.
IMPRESSION: No mammographic evidence of malignancy. A result letter of this
screening mammogram will be mailed directly to the patient.

RECOMMENDATION:
Screening mammogram in one year. (Code:[id number]-[id number])

BI-RADS CATEGORY  1: Negative.

## 2020-07-08 ENCOUNTER — Encounter: Payer: Self-pay | Admitting: Internal Medicine

## 2020-07-08 ENCOUNTER — Other Ambulatory Visit: Payer: Self-pay | Admitting: Internal Medicine

## 2020-07-08 ENCOUNTER — Ambulatory Visit (INDEPENDENT_AMBULATORY_CARE_PROVIDER_SITE_OTHER): Payer: Medicare Other | Admitting: Internal Medicine

## 2020-07-08 ENCOUNTER — Other Ambulatory Visit: Payer: Self-pay

## 2020-07-08 VITALS — BP 126/72 | HR 72 | Temp 96.9°F | Resp 16 | Ht 62.0 in | Wt 142.2 lb

## 2020-07-08 DIAGNOSIS — I10 Essential (primary) hypertension: Secondary | ICD-10-CM

## 2020-07-08 DIAGNOSIS — C44622 Squamous cell carcinoma of skin of right upper limb, including shoulder: Secondary | ICD-10-CM | POA: Diagnosis not present

## 2020-07-08 DIAGNOSIS — C44602 Unspecified malignant neoplasm of skin of right upper limb, including shoulder: Secondary | ICD-10-CM | POA: Diagnosis not present

## 2020-07-08 NOTE — Progress Notes (Signed)
   History of Present Illness:     Patient is a  very nice 86 y.o. [place]  with  HTN, HLD, Prediabetes & Vit D Deficiency   who  presents present for F/u with neg HT ROS - No c/o HA, dizziness, CP, palpitations, dyspnea or edema.  She also has concerns Re: a lesion of the dorsal Right hand that has been increasing in size over the last month.   Medications  Current Outpatient Medications (Endocrine & Metabolic):  .  alendronate (FOSAMAX) 70 MG tablet, Take 1 tablet (70 mg total) by mouth once a week.  Current Outpatient Medications (Cardiovascular):  .  hydrochlorothiazide (HYDRODIURIL) 25 MG tablet, Take ONE (1) tablet Daily for BP & Fluid Retention .  metoprolol tartrate (LOPRESSOR) 50 MG tablet, Take 1 tablet 2 x /day for BP .  simvastatin (ZOCOR) 40 MG tablet, Takes 1 tablet every night for cholesterol.  Current Outpatient Medications (Respiratory):  .  cetirizine (ZYRTEC) 10 MG tablet, Takes 1 tablet Daily .  montelukast (SINGULAIR) 10 MG tablet, Take 1 tablet daily for Allergies  Current Outpatient Medications (Analgesics):  .  aspirin 81 MG chewable tablet, Chew 81 mg by mouth daily. .  meloxicam (MOBIC) 15 MG tablet, Take 1/2 to 1  tablet Daily with Food for Pain & Inflammation  Current Outpatient Medications (Hematological):  Marland Kitchen  Cyanocobalamin (VITAMIN B-12) 1000 MCG SUBL, Takes 1 tab SL Daily  Current Outpatient Medications (Other):  Marland Kitchen  Cholecalciferol 125 MCG (5000 UT) capsule, Take 2 capsules Daily .  diphenhydramine-acetaminophen (TYLENOL PM) 25-500 MG TABS, Take 1 tablet by mouth at bedtime. Take every night per patient .  OVER THE COUNTER MEDICATION,   Problem list She has Essential hypertension; Hyperlipidemia, mixed; Personal history of lung cancer; Vitamin D deficiency; COPD (chronic obstructive pulmonary disease) with emphysema ([place]); Atherosclerosis of aorta ([place]); Osteoporosis; Abnormal glucose; and B12 deficiency on their problem list.    Observations/Objective:  BP 126/72   P 72   T 96.9 F    R 16   Ht 5\' 2"    Wt 142 lb    BMI 26.01    HEENT - WNL. Neck - supple.  Chest - Clear equal BS. Cor - Nl HS. RRR w/o sig m. Neuro -  Nl w/o focal abnormalities. Skin  - 8 mm firm pink raised lesion of the dorsal Right hand.   Procedure  (CPT - [id number])     After informed consent and aseptic pre, the above lesion was sharply excised with a #10 scalpel in an elliptical fashion full thickness to the deep sub-cut.  Lesion was sent for Path.  Wound edges were undermined and approximated with #4 vertical mattress sutures with Proline 3-0. Then the wound edges were aligned, everted and secured with a running lock stitch with Proline 3-0. Wound was cleansed with alcohol swab and soap & water. Antibiotic ung and sterile Tegaderm was applied. Patient instructed in post-op wound care. Advised f/u 12-14 days for suture removal.  Assessment and Plan:  1. Essential hypertension  2. Primary cancer of skin of right hand  - Surgical pathology  Follow Up Instructions:      I discussed the assessment and treatment plan with the patient. The patient was provided an opportunity to ask questions and all were answered. The patient agreed with the plan and demonstrated an understanding of the instructions.  Kirtland Bouchard, MD

## 2020-07-09 ENCOUNTER — Ambulatory Visit: Payer: Medicare Other | Admitting: Internal Medicine

## 2020-07-09 VITALS — Temp 97.9°F

## 2020-07-09 DIAGNOSIS — R69 Illness, unspecified: Secondary | ICD-10-CM

## 2020-07-09 NOTE — Progress Notes (Signed)
 -   c/o swelling of fingers of right (operated ) hand   o/exam   - No sign of infection  - Nl capillary refill of fingers.  - Slight swelling of fingers  Removed Ace wrap & reassured patient

## 2020-07-14 NOTE — Progress Notes (Signed)
===========================================================  -    Path report shows the  skin lesion was a [place] skin cancer,  so it's a good thing that it is excised  ===========================================================

## 2020-07-19 NOTE — Progress Notes (Signed)
     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 Path report found clear margins.   BP 114/68   Pulse 80   Temp (!) 97 F (36.1 C)   Resp 16   Ht 5\' 2"  (1.575 m)   Wt 143 lb 3.2 oz (65 kg)   BMI 26.19 kg/m    Wound of dorsal Rt hand  appears well healed w/o signs of infection & all suture removed. Abx ung and 2" x 3 " Tegaderm applied.   Dx - SCC Rt hand

## 2020-07-20 ENCOUNTER — Encounter: Payer: Self-pay | Admitting: Internal Medicine

## 2020-07-20 ENCOUNTER — Other Ambulatory Visit: Payer: Self-pay

## 2020-07-20 ENCOUNTER — Ambulatory Visit (INDEPENDENT_AMBULATORY_CARE_PROVIDER_SITE_OTHER): Payer: Self-pay | Admitting: Internal Medicine

## 2020-07-20 VITALS — BP 114/68 | HR 80 | Temp 97.0°F | Resp 16 | Ht 62.0 in | Wt 143.2 lb

## 2020-07-20 DIAGNOSIS — C44602 Unspecified malignant neoplasm of skin of right upper limb, including shoulder: Secondary | ICD-10-CM

## 2020-07-28 DIAGNOSIS — H524 Presbyopia: Secondary | ICD-10-CM | POA: Diagnosis not present

## 2020-07-28 DIAGNOSIS — Z961 Presence of intraocular lens: Secondary | ICD-10-CM | POA: Diagnosis not present

## 2020-09-09 ENCOUNTER — Other Ambulatory Visit: Payer: Self-pay | Admitting: *Deleted

## 2020-09-09 MED ORDER — SIMVASTATIN 40 MG PO TABS: ORAL_TABLET | ORAL | 1 refills | Status: DC

## 2020-10-05 DIAGNOSIS — L821 Other seborrheic keratosis: Secondary | ICD-10-CM | POA: Diagnosis not present

## 2020-10-05 DIAGNOSIS — C4442 Squamous cell carcinoma of skin of scalp and neck: Secondary | ICD-10-CM | POA: Diagnosis not present

## 2020-10-05 DIAGNOSIS — D225 Melanocytic nevi of trunk: Secondary | ICD-10-CM | POA: Diagnosis not present

## 2020-10-05 DIAGNOSIS — L905 Scar conditions and fibrosis of skin: Secondary | ICD-10-CM | POA: Diagnosis not present

## 2020-10-05 DIAGNOSIS — L570 Actinic keratosis: Secondary | ICD-10-CM | POA: Diagnosis not present

## 2020-10-05 DIAGNOSIS — D485 Neoplasm of uncertain behavior of skin: Secondary | ICD-10-CM | POA: Diagnosis not present

## 2020-10-07 ENCOUNTER — Ambulatory Visit (INDEPENDENT_AMBULATORY_CARE_PROVIDER_SITE_OTHER): Payer: Medicare Other | Admitting: Internal Medicine

## 2020-10-07 ENCOUNTER — Other Ambulatory Visit: Payer: Self-pay

## 2020-10-07 VITALS — BP 122/80 | HR 64 | Temp 96.7°F | Resp 16 | Ht 62.0 in | Wt 144.6 lb

## 2020-10-07 DIAGNOSIS — Z23 Encounter for immunization: Secondary | ICD-10-CM | POA: Diagnosis not present

## 2020-10-07 DIAGNOSIS — E782 Mixed hyperlipidemia: Secondary | ICD-10-CM | POA: Diagnosis not present

## 2020-10-07 DIAGNOSIS — E559 Vitamin D deficiency, unspecified: Secondary | ICD-10-CM

## 2020-10-07 DIAGNOSIS — I10 Essential (primary) hypertension: Secondary | ICD-10-CM | POA: Diagnosis not present

## 2020-10-07 DIAGNOSIS — Z79899 Other long term (current) drug therapy: Secondary | ICD-10-CM | POA: Diagnosis not present

## 2020-10-07 DIAGNOSIS — R7309 Other abnormal glucose: Secondary | ICD-10-CM

## 2020-10-07 NOTE — Progress Notes (Signed)
History of Present Illness:       This very nice 86 y.o. WWF  presents for 3 month follow up with HTN, HLD, Pre-Diabetes and Vitamin D Deficiency.  In 1998, she had excision of a RUL lung Ca & remains in apparent remission.       Patient is treated for HTN (2000)  & BP has been controlled at home. Today's BP is at goal -  122/80. Patient has had no complaints of any cardiac type chest pain, palpitations, dyspnea / orthopnea / PND, dizziness, claudication, or dependent edema.      Hyperlipidemia is controlled with diet & meds. Patient denies myalgias or other med SE's. Last Lipids were at goal:  Lab Results  Component Value Date   CHOL 173 06/28/2020   HDL 63 06/28/2020   LDLCALC 92 06/28/2020   TRIG 87 06/28/2020   CHOLHDL 2.7 06/28/2020    Also, the patient has history of PreDiabetes (A1c 5.9%/2009) and has had no symptoms of reactive hypoglycemia, diabetic polys, paresthesias or visual blurring.  Last A1c was Normal & at goal:  Lab Results  Component Value Date   HGBA1C 5.5 06/28/2020       Further, the patient also has history of Vitamin D Deficiency ("26"/2008) and supplements vitamin D without any suspected side-effects. Last vitamin D was at goal:  Lab Results  Component Value Date   VD25OH 91 06/28/2020    Current Outpatient Medications on File Prior to Visit  Medication Sig  . alendronate  70 MG  Take 1 tablet once a week.  Marland Kitchen aspirin 81 MG Chew  daily.  . Cetirizine 10 MG  Takes 1 tablet Daily  . Cholecalciferol 5000 U cap Take 2 capsules Daily  . VIT B-12 1000 MCG SL Takes 1 tab SL Daily  . TYLENOL PM Take 1 tablet at bedtime  . hctz 25 MG  Take ONE  tablet Daily for BP & Fluid Retention  . meloxicam  15 MG  Take 1/2 to 1  tablet Daily  . metoprolol tartrate  50 MG  Take 1 tablet 2 x /day for BP  . montelukast  10 MG  Take 1 tablet daily for Allergies  . simvastatin  40 MG  Takes 1 tablet every night for cholesterol.    Allergies  Allergen Reactions   . Ace Inhibitors Cough  . Calcium-Containing Compounds Other (See Comments)    constpation  . Lactose Intolerance (Gi) Diarrhea  . Polysporin [Bacitracin-Polymyxin B] Rash    Polysporin or neosporin gave her a rash, couldn't remember which    PMHx:   Past Medical History:  Diagnosis Date  . COPD (chronic obstructive pulmonary disease) with emphysema ([place])   . COVID-19 12/09/2019  . Hyperlipidemia   . Hypertension   . Lung cancer, upper lobe ([place]) 12/04/1996   RUL  . Osteoporosis   . S/P partial lobectomy of lung    [id number]  . Thigh shingles 12/04/1996  . Thoracic aorta atherosclerosis ([place])   . Vitamin D deficiency     Immunization History  Administered Date(s) Administered  . Influenza, High Dose Seasonal PF 09/15/2015, 10/02/2017, 07/27/2019  . Influenza-Unspecified 08/04/2012, 09/03/2013, 09/03/2018  . PFIZER SARS-COV-2 Vaccination 01/26/2020, 02/16/2020  . Pneumococcal Conjugate-13 07/21/2014  . Pneumococcal Polysaccharide-23 10/30/2005  . Td 04/03/2013    Past Surgical History:  Procedure Laterality Date  . CARPAL TUNNEL RELEASE Right    2009  . LUNG REMOVAL, PARTIAL Right    RUL  [place] Left 03/20/2017   Procedure: EXICISION OLECRANON BURSA LEFT ELBOW;  Surgeon: Daryll Brod, MD;  Location: [facility];  Service: Orthopedics;  Laterality: Left;  with scb block in preop  . SHOULDER ADHESION RELEASE Right    2004    FHx:    Reviewed / unchanged  SHx:    Reviewed / unchanged   Systems Review:  Constitutional: Denies fever, chills, wt changes, headaches, insomnia, fatigue, night sweats, change in appetite. Eyes: Denies redness, blurred vision, diplopia, discharge, itchy, watery eyes.  ENT: Denies discharge, congestion, post nasal drip, epistaxis, sore throat, earache, hearing loss, dental pain, tinnitus, vertigo, sinus pain, snoring.  CV: Denies chest pain, palpitations, irregular heartbeat, syncope, dyspnea, diaphoresis,  orthopnea, PND, claudication or edema. Respiratory: denies cough, dyspnea, DOE, pleurisy, hoarseness, laryngitis, wheezing.  Gastrointestinal: Denies dysphagia, odynophagia, heartburn, reflux, water brash, abdominal pain or cramps, nausea, vomiting, bloating, diarrhea, constipation, hematemesis, melena, hematochezia  or hemorrhoids. Genitourinary: Denies dysuria, frequency, urgency, nocturia, hesitancy, discharge, hematuria or flank pain. Musculoskeletal: Denies arthralgias, myalgias, stiffness, jt. swelling, pain, limping or strain/sprain.  Skin: Denies pruritus, rash, hives, warts, acne, eczema or change in skin lesion(s). Neuro: No weakness, tremor, incoordination, spasms, paresthesia or pain. Psychiatric: Denies confusion, memory loss or sensory loss. Endo: Denies change in weight, skin or hair change.  Heme/Lymph: No excessive bleeding, bruising or enlarged lymph nodes.  Physical Exam  BP 122/80   Pulse 64   Temp (!) 96.7 F (35.9 C)   Resp 16   Ht 5\' 2"  (1.575 m)   Wt 144 lb 9.6 oz (65.6 kg)   SpO2 98%   BMI 26.45 kg/m   Appears  well nourished, well groomed  and in no distress.  Eyes: PERRLA, EOMs, conjunctiva no swelling or erythema. Sinuses: No frontal/maxillary tenderness ENT/Mouth: EAC's clear, TM's nl w/o erythema, bulging. Nares clear w/o erythema, swelling, exudates. Oropharynx clear without erythema or exudates. Oral hygiene is good. Tongue normal, non obstructing. Hearing intact.  Neck: Supple. Thyroid not palpable. Car 2+/2+ without bruits, nodes or JVD. Chest: Respirations nl with BS clear & equal w/o rales, rhonchi, wheezing or stridor.  Cor: Heart sounds normal w/ regular rate and rhythm without sig. murmurs, gallops, clicks or rubs. Peripheral pulses normal and equal  without edema.  Abdomen: Soft & bowel sounds normal. Non-tender w/o guarding, rebound, hernias, masses or organomegaly.  Lymphatics: Unremarkable.  Musculoskeletal: Full ROM all peripheral  extremities, joint stability, 5/5 strength and normal gait.  Skin: Warm, dry without exposed rashes, lesions or ecchymosis apparent.  Neuro: Cranial nerves intact, reflexes equal bilaterally. Sensory-motor testing grossly intact. Tendon reflexes grossly intact.  Pysch: Alert & oriented x 3.  Insight and judgement nl & appropriate. No ideations.  Assessment and Plan:  1. Essential hypertension  - Continue medication, monitor blood pressure at home.  - Continue DASH diet.  Reminder to go to the ER if any CP,  SOB, nausea, dizziness, severe HA, changes vision/speech.  - CBC with Differential/Platelet - COMPLETE METABOLIC PANEL WITH GFR - Magnesium - TSH  2. Hyperlipidemia, mixed  - Continue diet/meds, exercise,& lifestyle modifications.  - Continue monitor periodic cholesterol/liver & renal functions   - Lipid panel - TSH  3. Abnormal glucose  - Continue diet, exercise  - Lifestyle modifications.  - Monitor appropriate labs.  - Hemoglobin A1c - Insulin, random  4. Vitamin D deficiency  - Continue supplementation.  - VITAMIN D 25 Hydroxy   5. Medication management  - CBC  with Differential/Platelet - COMPLETE METABOLIC PANEL WITH GFR - Magnesium - Lipid panel - TSH - Hemoglobin A1c - Insulin, random - VITAMIN D 25 Hydroxy  6. Need for immunization against influenza  - Flu vaccine HIGH DOSE PF (Fluzone High dose)        Discussed  regular exercise, BP monitoring, weight control to achieve/maintain BMI less than 25 and discussed med and SE's. Recommended labs to assess and monitor clinical status with further disposition pending results of labs.  I discussed the assessment and treatment plan with the patient. The patient was provided an opportunity to ask questions and all were answered. The patient agreed with the plan and demonstrated an understanding of the instructions.  I provided over 30 minutes of exam, counseling, chart review and  complex critical decision  making.   Kirtland Bouchard, MD

## 2020-10-07 NOTE — Patient Instructions (Signed)

## 2020-10-08 LAB — COMPLETE METABOLIC PANEL WITH GFR
AG Ratio: 1.8 (calc) (ref 1.0–2.5)
ALT: 10 U/L (ref 6–29)
AST: 21 U/L (ref 10–35)
Albumin: 4.6 g/dL (ref 3.6–5.1)
Alkaline phosphatase (APISO): 65 U/L (ref 37–153)
BUN: 17 mg/dL (ref 7–25)
CO2: 33 mmol/L — ABNORMAL HIGH (ref 20–32)
Calcium: 9.9 mg/dL (ref 8.6–10.4)
Chloride: 102 mmol/L (ref 98–110)
Creat: 0.73 mg/dL (ref 0.60–0.88)
GFR, Est African American: 87 mL/min/{1.73_m2} (ref >=60)
GFR, Est Non African American: 75 mL/min/{1.73_m2} (ref >=60)
Globulin: 2.5 g/dL (calc) (ref 1.9–3.7)
Glucose, Bld: 100 mg/dL — ABNORMAL HIGH (ref 65–99)
Potassium: 3.8 mmol/L (ref 3.5–5.3)
Sodium: 144 mmol/L (ref 135–146)
Total Bilirubin: 0.5 mg/dL (ref 0.2–1.2)
Total Protein: 7.1 g/dL (ref 6.1–8.1)

## 2020-10-08 LAB — CBC WITH DIFFERENTIAL/PLATELET
Absolute Monocytes: 446 cells/uL (ref 200–950)
Basophils Absolute: 19 cells/uL (ref 0–200)
Basophils Relative: 0.4 %
Eosinophils Absolute: 91 cells/uL (ref 15–500)
Eosinophils Relative: 1.9 %
HCT: 44.8 % (ref 35.0–45.0)
Hemoglobin: 14 g/dL (ref 11.7–15.5)
Lymphs Abs: 1502 cells/uL (ref 850–3900)
MCH: 27.8 pg (ref 27.0–33.0)
MCHC: 31.3 g/dL — ABNORMAL LOW (ref 32.0–36.0)
MCV: 89.1 fL (ref 80.0–100.0)
MPV: 10.8 fL (ref 7.5–12.5)
Monocytes Relative: 9.3 %
Neutro Abs: 2741 cells/uL (ref 1500–7800)
Neutrophils Relative %: 57.1 %
Platelets: 189 10*3/uL (ref 140–400)
RBC: 5.03 10*6/uL (ref 3.80–5.10)
RDW: 12.4 % (ref 11.0–15.0)
Total Lymphocyte: 31.3 %
WBC: 4.8 10*3/uL (ref 3.8–10.8)

## 2020-10-08 LAB — LIPID PANEL
Cholesterol: 181 mg/dL (ref <200)
HDL: 71 mg/dL (ref >=50)
LDL Cholesterol (Calc): 92 mg/dL (calc)
Non-HDL Cholesterol (Calc): 110 mg/dL (calc) (ref <130)
Total CHOL/HDL Ratio: 2.5 (calc) (ref <5.0)
Triglycerides: 87 mg/dL (ref <150)

## 2020-10-08 LAB — MAGNESIUM: Magnesium: 1.7 mg/dL (ref 1.5–2.5)

## 2020-10-08 LAB — VITAMIN D 25 HYDROXY (VIT D DEFICIENCY, FRACTURES): Vit D, 25-Hydroxy: 102 ng/mL — ABNORMAL HIGH (ref 30–100)

## 2020-10-08 LAB — HEMOGLOBIN A1C
Hgb A1c MFr Bld: 5.8 % of total Hgb — ABNORMAL HIGH (ref <5.7)
Mean Plasma Glucose: 120 (calc)
eAG (mmol/L): 6.6 (calc)

## 2020-10-08 LAB — INSULIN, RANDOM: Insulin: 4.8 u[IU]/mL

## 2020-10-08 LAB — TSH: TSH: 1.85 mIU/L (ref 0.40–4.50)

## 2020-10-08 NOTE — Progress Notes (Signed)
========================================================== -   Test results slightly outside the reference range are not unusual. If there is anything important, I will review this with you,  otherwise it is considered normal test values.  If you have further questions,  please do not hesitate to contact me at the office or via My Chart.  ==========================================================  -  Chol = 181 -  Excellent   - Very low risk for Heart Attack  / Stroke ==========================================================  - A1c = 5.8% - borderline elevated sugars -    - Avoid Sweets, Candy & White Stuff   - Rice, Potatoes, Breads &  Pasta ==========================================================  -  Vitamin D = 10-2 - Excellent  ==========================================================  -  All Else - CBC - Kidneys - Electrolytes - Liver - Magnesium & Thyroid    - all  Normal / OK ==========================================================

## 2020-10-09 ENCOUNTER — Encounter: Payer: Self-pay | Admitting: Internal Medicine

## 2020-11-17 DIAGNOSIS — D045 Carcinoma in situ of skin of trunk: Secondary | ICD-10-CM | POA: Diagnosis not present

## 2020-12-02 DIAGNOSIS — L821 Other seborrheic keratosis: Secondary | ICD-10-CM | POA: Diagnosis not present

## 2020-12-02 DIAGNOSIS — L989 Disorder of the skin and subcutaneous tissue, unspecified: Secondary | ICD-10-CM | POA: Diagnosis not present

## 2020-12-02 DIAGNOSIS — D485 Neoplasm of uncertain behavior of skin: Secondary | ICD-10-CM | POA: Diagnosis not present

## 2020-12-02 DIAGNOSIS — Z85828 Personal history of other malignant neoplasm of skin: Secondary | ICD-10-CM | POA: Diagnosis not present

## 2020-12-02 DIAGNOSIS — R202 Paresthesia of skin: Secondary | ICD-10-CM | POA: Diagnosis not present

## 2020-12-02 DIAGNOSIS — L905 Scar conditions and fibrosis of skin: Secondary | ICD-10-CM | POA: Diagnosis not present

## 2020-12-06 ENCOUNTER — Encounter: Payer: Medicare Other | Admitting: Physician Assistant

## 2020-12-07 ENCOUNTER — Encounter: Payer: Medicare Other | Admitting: Adult Health Nurse Practitioner

## 2020-12-07 ENCOUNTER — Other Ambulatory Visit: Payer: Self-pay | Admitting: Internal Medicine

## 2020-12-07 DIAGNOSIS — I10 Essential (primary) hypertension: Secondary | ICD-10-CM

## 2020-12-08 DIAGNOSIS — L989 Disorder of the skin and subcutaneous tissue, unspecified: Secondary | ICD-10-CM | POA: Diagnosis not present

## 2020-12-14 ENCOUNTER — Encounter: Payer: Medicare Other | Admitting: Adult Health Nurse Practitioner

## 2020-12-30 ENCOUNTER — Encounter: Payer: Self-pay | Admitting: Internal Medicine

## 2021-01-15 ENCOUNTER — Other Ambulatory Visit: Payer: Self-pay | Admitting: Internal Medicine

## 2021-01-15 DIAGNOSIS — I10 Essential (primary) hypertension: Secondary | ICD-10-CM

## 2021-01-15 DIAGNOSIS — E782 Mixed hyperlipidemia: Secondary | ICD-10-CM

## 2021-01-15 MED ORDER — HYDROCHLOROTHIAZIDE 25 MG PO TABS: ORAL_TABLET | ORAL | 0 refills | Status: DC

## 2021-01-15 MED ORDER — SIMVASTATIN 40 MG PO TABS: ORAL_TABLET | ORAL | 0 refills | Status: DC

## 2021-01-25 ENCOUNTER — Encounter: Payer: Medicare Other | Admitting: Adult Health Nurse Practitioner

## 2021-01-31 ENCOUNTER — Other Ambulatory Visit: Payer: Self-pay | Admitting: Internal Medicine

## 2021-01-31 DIAGNOSIS — J301 Allergic rhinitis due to pollen: Secondary | ICD-10-CM

## 2021-02-05 ENCOUNTER — Other Ambulatory Visit: Payer: Self-pay | Admitting: Internal Medicine

## 2021-02-05 DIAGNOSIS — J301 Allergic rhinitis due to pollen: Secondary | ICD-10-CM

## 2021-02-05 MED ORDER — MONTELUKAST SODIUM 10 MG PO TABS: ORAL_TABLET | ORAL | 3 refills | Status: DC

## 2021-02-07 ENCOUNTER — Ambulatory Visit (INDEPENDENT_AMBULATORY_CARE_PROVIDER_SITE_OTHER): Payer: Medicare Other | Admitting: Adult Health Nurse Practitioner

## 2021-02-07 ENCOUNTER — Ambulatory Visit
Admission: RE | Admit: 2021-02-07 | Discharge: 2021-02-07 | Disposition: A | Discharge status: Home / Self Care | Payer: Medicare Other | Source: Ambulatory Visit | Attending: Adult Health Nurse Practitioner | Admitting: Adult Health Nurse Practitioner

## 2021-02-07 ENCOUNTER — Other Ambulatory Visit: Payer: Self-pay

## 2021-02-07 ENCOUNTER — Encounter: Payer: Self-pay | Admitting: Adult Health Nurse Practitioner

## 2021-02-07 VITALS — BP 118/70 | HR 76 | Temp 97.6°F | Ht 62.0 in | Wt 146.0 lb

## 2021-02-07 DIAGNOSIS — Z85118 Personal history of other malignant neoplasm of bronchus and lung: Secondary | ICD-10-CM

## 2021-02-07 DIAGNOSIS — I10 Essential (primary) hypertension: Secondary | ICD-10-CM | POA: Diagnosis not present

## 2021-02-07 DIAGNOSIS — E782 Mixed hyperlipidemia: Secondary | ICD-10-CM | POA: Diagnosis not present

## 2021-02-07 DIAGNOSIS — E538 Deficiency of other specified B group vitamins: Secondary | ICD-10-CM | POA: Diagnosis not present

## 2021-02-07 DIAGNOSIS — S2241XA Multiple fractures of ribs, right side, initial encounter for closed fracture: Secondary | ICD-10-CM | POA: Diagnosis not present

## 2021-02-07 DIAGNOSIS — R918 Other nonspecific abnormal finding of lung field: Secondary | ICD-10-CM | POA: Diagnosis not present

## 2021-02-07 DIAGNOSIS — M810 Age-related osteoporosis without current pathological fracture: Secondary | ICD-10-CM

## 2021-02-07 DIAGNOSIS — Z79899 Other long term (current) drug therapy: Secondary | ICD-10-CM

## 2021-02-07 DIAGNOSIS — R7309 Other abnormal glucose: Secondary | ICD-10-CM | POA: Diagnosis not present

## 2021-02-07 DIAGNOSIS — E559 Vitamin D deficiency, unspecified: Secondary | ICD-10-CM | POA: Diagnosis not present

## 2021-02-07 DIAGNOSIS — J439 Emphysema, unspecified: Secondary | ICD-10-CM

## 2021-02-07 DIAGNOSIS — R300 Dysuria: Secondary | ICD-10-CM

## 2021-02-07 DIAGNOSIS — I700 Atherosclerosis of aorta: Secondary | ICD-10-CM | POA: Diagnosis not present

## 2021-02-07 DIAGNOSIS — E611 Iron deficiency: Secondary | ICD-10-CM

## 2021-02-07 DIAGNOSIS — J301 Allergic rhinitis due to pollen: Secondary | ICD-10-CM | POA: Diagnosis not present

## 2021-02-07 NOTE — Patient Instructions (Addendum)
Continue to take ibuprofen 600mg  every 6 hours OR 800mg  every 8 hours.  Be sure to take this with food to avoid stomach upset.  Applying ice to the area will help with inflammation.  You can apply heat to the area to help relax any tense muscles.     Ask insurance and pharmacy about shingrix - it is a 2 part shot that we will not be getting in the office.   Suggest getting AFTER covid vaccines, have to wait at least a month This shot can make you feel bad due to such good immune response it can trigger some inflammation so take tylenol or aleve day of or day after and plan on resting.   Can go to [email or web address] for more information  Shingrix Vaccination  Two vaccines are licensed and recommended to prevent shingles in the [place] Zoster vaccine live (ZVL, Zostavax) has been in use since 2006. Recombinant zoster vaccine (RZV, Shingrix), has been in use since 2017 and is recommended by ACIP as the preferred shingles vaccine.  What Everyone Should Know about Shingles Vaccine (Shingrix) One of the Recommended Vaccines by Disease Shingles vaccination is the only way to protect against shingles and postherpetic neuralgia (PHN), the most common complication from shingles. CDC recommends that healthy adults 50 years and older get two doses of the shingles vaccine called Shingrix (recombinant zoster vaccine), separated by 2 to 6 months, to prevent shingles and the complications from the disease. Your doctor or pharmacist can give you Shingrix as a shot in your upper arm. Shingrix provides strong protection against shingles and PHN. Two doses of Shingrix is more than 90% effective at preventing shingles and PHN. Protection stays above 85% for at least the first four years after you get vaccinated. Shingrix is the preferred vaccine, over Zostavax (zoster vaccine live), a shingles vaccine in use since 2006. Zostavax may still be used to prevent shingles  in healthy adults 60 years and older. For example, you could use Zostavax if a person is allergic to Shingrix, prefers Zostavax, or requests immediate vaccination and Shingrix is unavailable. Who Should Get Shingrix? Healthy adults 50 years and older should get two doses of Shingrix, separated by 2 to 6 months. You should get Shingrix even if in the past you . had shingles  . received Zostavax  . are not sure if you had chickenpox There is no maximum age for getting Shingrix. If you had shingles in the past, you can get Shingrix to help prevent future occurrences of the disease. There is no specific length of time that you need to wait after having shingles before you can receive Shingrix, but generally you should make sure the shingles rash has gone away before getting vaccinated. You can get Shingrix whether or not you remember having had chickenpox in the past. Studies show that more than 99% of Americans 40 years and older have had chickenpox, even if they don't remember having the disease. Chickenpox and shingles are related because they are caused by the same virus (varicella zoster virus). After a person recovers from chickenpox, the virus stays dormant (inactive) in the body. It can reactivate years later and cause shingles. If you had Zostavax in the recent past, you should wait at least eight weeks before getting Shingrix. Talk to your healthcare provider to determine the best time to get Shingrix. Shingrix is available in [facility] and pharmacies. To find doctor's offices or pharmacies near you that offer the vaccine, visit [employer] Vaccine FinderExternal. If  you have questions about Shingrix, talk with your healthcare provider. Vaccine for Those 12 Years and Older  Shingrix reduces the risk of shingles and PHN by more than 90% in people 47 and older. CDC recommends the vaccine for healthy adults 56 and older.  Who Should Not Get Shingrix? You should not get Shingrix if  you: . have ever had a severe allergic reaction to any component of the vaccine or after a dose of Shingrix  . tested negative for immunity to varicella zoster virus. If you test negative, you should get chickenpox vaccine.  . currently have shingles  . currently are pregnant or breastfeeding. Women who are pregnant or breastfeeding should wait to get Shingrix.  Marland Kitchen receive specific antiviral drugs (acyclovir, famciclovir, or valacyclovir) 24 hours before vaccination (avoid use of these antiviral drugs for 14 days after vaccination)- zoster vaccine live only If you have a minor acute (starts suddenly) illness, such as a cold, you may get Shingrix. But if you have a moderate or severe acute illness, you should usually wait until you recover before getting the vaccine. This includes anyone with a temperature of 101.74F or higher. The side effects of the Shingrix are temporary, and usually last 2 to 3 days. While you may experience pain for a few days after getting Shingrix, the pain will be less severe than having shingles and the complications from the disease. How Well Does Shingrix Work? Two doses of Shingrix provides strong protection against shingles and postherpetic neuralgia (PHN), the most common complication of shingles. . In adults 71 to 85 years old who got two doses, Shingrix was 97% effective in preventing shingles; among adults 70 years and older, Shingrix was 91% effective.  . In adults 55 to 85 years old who got two doses, Shingrix was 91% effective in preventing PHN; among adults 70 years and older, Shingrix was 89% effective. Shingrix protection remained high (more than 85%) in people 70 years and older throughout the four years following vaccination. Since your risk of shingles and PHN increases as you get older, it is important to have strong protection against shingles in your older years. Top of Page  What Are the Possible Side Effects of Shingrix? Studies show that Shingrix is safe.  The vaccine helps your body create a strong defense against shingles. As a result, you are likely to have temporary side effects from getting the shots. The side effects may affect your ability to do normal daily activities for 2 to 3 days. Most people got a sore arm with mild or moderate pain after getting Shingrix, and some also had redness and swelling where they got the shot. Some people felt tired, had muscle pain, a headache, shivering, fever, stomach pain, or nausea. About 1 out of 6 people who got Shingrix experienced side effects that prevented them from doing regular activities. Symptoms went away on their own in about 2 to 3 days. Side effects were more common in younger people. You might have a reaction to the first or second dose of Shingrix, or both doses. If you experience side effects, you may choose to take over-the-counter pain medicine such as ibuprofen or acetaminophen. If you experience side effects from Shingrix, you should report them to the Vaccine Adverse Event Reporting System (VAERS). Your doctor might file this report, or you can do it yourself through the VAERS websiteExternal, or by calling [phone]. If you have any questions about side effects from Shingrix, talk with your doctor. The shingles vaccine does  not contain thimerosal (a preservative containing mercury). Top of Page  When Should I See a Doctor Because of the Side Effects I Experience From Shingrix? In clinical trials, Shingrix was not associated with serious adverse events. In fact, serious side effects from vaccines are extremely rare. For example, for every 1 million doses of a vaccine given, only one or two people may have a severe allergic reaction. Signs of an allergic reaction happen within minutes or hours after vaccination and include hives, swelling of the face and throat, difficulty breathing, a fast heartbeat, dizziness, or weakness. If you experience these or any other life-threatening symptoms, see  a doctor right away. Shingrix causes a strong response in your immune system, so it may produce short-term side effects more intense than you are used to from other vaccines. These side effects can be uncomfortable, but they are expected and usually go away on their own in 2 or 3 days. Top of Page  How Can I Pay For Shingrix? There are several ways shingles vaccine may be paid for: Medicare . Medicare Part D plans cover the shingles vaccine, but there may be a cost to you depending on your plan. There may be a copay for the vaccine, or you may need to pay in full then get reimbursed for a certain amount.  . Medicare Part B does not cover the shingles vaccine. Medicaid . Medicaid may or may not cover the vaccine. Contact your insurer to find out. Private health insurance . Many private health insurance plans will cover the vaccine, but there may be a cost to you depending on your plan. Contact your insurer to find out. Vaccine assistance programs . Some pharmaceutical companies provide vaccines to eligible adults who cannot afford them. You may want to check with the vaccine manufacturer, [employer], about Shingrix. If you do not currently have health insurance, learn more about affordable health coverage optionsExternal. To find doctor's offices or pharmacies near you that offer the vaccine, visit HealthMap Vaccine FinderExternal.

## 2021-02-07 NOTE — Progress Notes (Signed)
COMPLETE PHYSICAL  Assessment:   Essential hypertension - continue medications:HCTZ 25mg , metoprolol 50mg  BID DASH diet, exercise and monitor at home. Call if greater than 130/80.  - CBC with Differential/Platelet - BASIC METABOLIC PANEL WITH GFR - Hepatic function panel - TSH   Hyperlipidemia -continue simvastatin 40mg and could not tolerate crestor  -check lipids, decrease fatty foods, increase activity.  - Lipid panel  Abnormal glucose Discussed general issues about diabetes pathophysiology and management., Educational material distributed., Suggested low cholesterol diet., Encouraged aerobic exercise., Discussed foot care., Reminded to get yearly retinal exam. - Hemoglobin A1c   Vitamin D deficiency - Vit D  25 hydroxy (rtn osteoporosis monitoring)   Medication management - Magnesium   Atherosclerosis of aorta Control blood pressure, cholesterol, glucose, increase exercise.   Hx of cancer of lung CXR 12/2019, continue follow up   Osteoporosis Continue vitamin D, Ca,  Did not tolerate fosamax  Environmental allergies Allergic rhinitis- Allegra OTC, increase H20, allergy hygiene explained.  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 Discussed med's effects and SE's.   Over 30 minutes of face to face interview, exam, counseling, chart review, and critical decision making was performed.    Future Appointments  Date Time Provider [place]  02/07/2022  2:00 PM Garnet Sierras, NP [facility] None     Subjective:   Penny [name] is a 87 y.o. female who presents for complete physical on hypertension, prediabetes, hyperlipidemia, vitamin D def.   Reports that she was out  doing yard work about two days ago.  Reports she fell on her bottom.  Th left side has been hurtinh her.  She is taking ibuprofen twice a day that is helping.  She reports when she engages those muscles it increase the pain, tenderness.    Was taking fosamax for osteoporosis.  She reports that the medication made her feel nauseated and she decided to stop taking this.  Discussed with patient. Will re-check DEXA this year with mammogram.   She had COVID in Jan with post Covid pneumonia,without sequela. She is in her new townhouse. Leon daughter, lives with her 5-6 years ago after divorce and 2 boys come here, at [facility] and one is working, Scientist, physiological he comes here.   BMI is Body mass index is 26.7 kg/m., she has been working on diet and exercise.  Wt Readings from Last 3 Encounters:  02/07/21 146 lb (66.2 kg)  10/07/20 144 lb 9.6 oz (65.6 kg)  07/20/20 143 lb 3.2 oz (65 kg)   Her blood pressure has been controlled at home, today their BP is BP: 118/70 She does not workout but is very active and does try to walk.  She denies chest pain, shortness of breath, dizziness.   She is on cholesterol medication, simvastatin 40 and denies myalgias. Her cholesterol is at goal. The cholesterol last visit was:   Lab Results  Component Value Date   CHOL 181 10/07/2020   HDL 71 10/07/2020   LDLCALC 92 10/07/2020   TRIG 87 10/07/2020   CHOLHDL 2.5 10/07/2020   She has been working on diet and exercise for prediabetes, and denies paresthesia of the feet, polydipsia and polyuria. Last A1C in the office was:  Lab Results  Component  Value Date   HGBA1C 5.8 (H) 10/07/2020   Patient is on Vitamin D supplement. Discussed decreasing dose, 5,000IU Lab Results  Component Value Date   VD25OH 102 (H) 10/07/2020      She is on prevagin and memory pills, states she will put things places and can not remember where she puts it.She has no issues with sleeping, she wakes up resting well. No numbness/tingling in hands or  feet. B12 elevated, discussed reduced dose for this.  Lab Results  Component Value Date   VITAMINB12 >2,000 (H) 06/28/2020    Medication Review Current Outpatient Medications on File Prior to Visit  Medication Sig Dispense Refill  . aspirin 81 MG chewable tablet Chew 81 mg by mouth daily.    . cetirizine (ZYRTEC) 10 MG tablet Takes 1 tablet Daily    . Cholecalciferol 125 MCG (5000 UT) capsule Take 2 capsules Daily 180 capsule 99  . CINNAMON PO Take 1,000 mg by mouth daily.    . Cyanocobalamin (VITAMIN B-12) 1000 MCG SUBL Takes 1 tab SL Daily  0  . diphenhydramine-acetaminophen (TYLENOL PM) 25-500 MG TABS Take 1 tablet by mouth at bedtime. Take every night per patient    . hydrochlorothiazide (HYDRODIURIL) 25 MG tablet Take  1 tablet  Daily  for BP & Fluid Retention /Ankle Swelling 90 tablet 0  . meloxicam (MOBIC) 15 MG tablet Take 1/2 to 1  tablet Daily with Food for Pain & Inflammation 90 tablet 3  . metoprolol tartrate (LOPRESSOR) 50 MG tablet TAKE 1 TABLET BY MOUTH TWICE DAILY FOR BLOOD PRESSURE 180 tablet 1  . montelukast (SINGULAIR) 10 MG tablet Take  1 tablet  Daily  for Allergies 90 tablet 3  . OVER THE COUNTER MEDICATION     . simvastatin (ZOCOR) 40 MG tablet Take  1 tablet  at Bedtime  for Cholesterol. 90 tablet 0   No current facility-administered medications on file prior to visit.    Current Problems (verified) Patient Active Problem List   Diagnosis Date Noted  . B12 deficiency 06/28/2020  . Abnormal glucose 10/09/2018  . Osteoporosis 12/28/2015  . COPD (chronic obstructive pulmonary disease) with emphysema ([place]) 02/25/2015  . Atherosclerosis of aorta ([place]) 02/25/2015  . Essential hypertension   . Hyperlipidemia, mixed   . Vitamin D deficiency   . Personal history of lung cancer 12/04/1996    Immunization History  Administered Date(s) Administered  . Influenza, High Dose Seasonal PF 09/15/2015, 10/02/2017, 07/27/2019, 10/07/2020  . Influenza-Unspecified  08/04/2012, 09/03/2013, 09/03/2018  . PFIZER(Purple Top)SARS-COV-2 Vaccination 01/26/2020, 02/16/2020  . Pneumococcal Conjugate-13 07/21/2014  . Pneumococcal Polysaccharide-23 10/30/2005  . Td 04/03/2013   Preventative care: Last colonoscopy: 2012 done  Last mammogram: 05/2020 Last pap smear/pelvic exam: 2012 negative DEXA: 05/2019, has been on alendronate since 2016- stopped 2019- -2.3 Echo 2009 normal EF CXR 2021 CT cervical spin 10/2014 CT head 10/20/2014  Names of Other Physician/Practitioners you currently use: 1. [facility]- here for primary care 2. Dr. Prudencio Burly, eye doctor, last visit 2020 3. , dentist, last visit 2020 4. Dr. Allyson Sabal, derm, 2019, goes annually  Patient Care Team: Unk Pinto, MD as PCP - General (Internal Medicine) Druscilla Brownie, MD as Consulting Physician (Dermatology) Daryll Brod, MD as Consulting Physician (Orthopedic Surgery) Richmond Campbell, MD as Consulting Physician (Gastroenterology) Nicanor Alcon, MD as Attending Physician (Thoracic Surgery)  Allergies Allergies  Allergen Reactions  . Ace Inhibitors Cough  . Calcium-Containing Compounds Other (See Comments)    constpation  .  Lactose Intolerance (Gi) Diarrhea  . Polysporin [Bacitracin-Polymyxin B] Rash    Polysporin or neosporin gave her a rash, couldn't remember which    SURGICAL HISTORY She  has a past surgical history that includes Carpal tunnel release (Right); Shoulder adhesion release (Right); Lung removal, partial (Right); and Olecranon bursectomy (Left, 03/20/2017). FAMILY HISTORY Her family history includes Breast cancer in her paternal aunt; Heart disease in her mother; Lung cancer in her father. SOCIAL HISTORY She  reports that she quit smoking about 24 years ago. She has never used smokeless tobacco. She reports current alcohol use of about 1.0 standard drink of alcohol per week. She reports that she does not use  drugs.   Objective:   Blood pressure 118/70, pulse 76, temperature 97.6 F (36.4 C), height 5\' 2"  (1.575 m), weight 146 lb (66.2 kg), SpO2 94 %. Body mass index is 26.7 kg/m.  General appearance: alert, no distress, WD/WN,  female HEENT: normocephalic, sclerae anicteric, TMs pearly, nares patent, no discharge or erythema, pharynx normal Oral cavity: MMM, no lesions Neck: supple, no lymphadenopathy, no thyromegaly, no masses Heart: RRR, normal S1, S2, no murmurs Lungs: CTA bilaterally, no wheezes, rhonchi, or rales Abdomen: +bs, soft, non tender, non distended, no masses, no hepatomegaly, no splenomegaly Musculoskeletal:  no swelling, no obvious deformity.  Point tenderness to left gluteal, medial.  No erythema or ecchymosis noted. Extremities: no edema, no cyanosis, no clubbing Pulses: 2+ symmetric, upper and lower extremities, normal cap refill Neurological: alert, oriented x 3, CN2-12 intact, strength normal upper extremities and lower extremities, sensation normal throughout, DTRs 2+ throughout, no cerebellar signs, gait normal Psychiatric: normal affect, behavior normal, pleasant  Breast: defer Gyn: defer Rectal: defer   Bayard Males, DNP [facility] 02/07/2021  2:52 PM

## 2021-02-09 ENCOUNTER — Other Ambulatory Visit: Payer: Self-pay | Admitting: Adult Health Nurse Practitioner

## 2021-02-09 DIAGNOSIS — N3000 Acute cystitis without hematuria: Secondary | ICD-10-CM

## 2021-02-09 LAB — COMPLETE METABOLIC PANEL WITH GFR
AG Ratio: 1.6 (calc) (ref 1.0–2.5)
ALT: 10 U/L (ref 6–29)
AST: 19 U/L (ref 10–35)
Albumin: 4.2 g/dL (ref 3.6–5.1)
Alkaline phosphatase (APISO): 63 U/L (ref 37–153)
BUN/Creatinine Ratio: 22 (calc) (ref 6–22)
BUN: 20 mg/dL (ref 7–25)
CO2: 33 mmol/L — ABNORMAL HIGH (ref 20–32)
Calcium: 9.7 mg/dL (ref 8.6–10.4)
Chloride: 103 mmol/L (ref 98–110)
Creat: 0.89 mg/dL — ABNORMAL HIGH (ref 0.60–0.88)
GFR, Est African American: 68 mL/min/{1.73_m2} (ref >=60)
GFR, Est Non African American: 59 mL/min/{1.73_m2} — ABNORMAL LOW (ref >=60)
Globulin: 2.6 g/dL (calc) (ref 1.9–3.7)
Glucose, Bld: 96 mg/dL (ref 65–99)
Potassium: 4 mmol/L (ref 3.5–5.3)
Sodium: 144 mmol/L (ref 135–146)
Total Bilirubin: 0.4 mg/dL (ref 0.2–1.2)
Total Protein: 6.8 g/dL (ref 6.1–8.1)

## 2021-02-09 LAB — LIPID PANEL
Cholesterol: 176 mg/dL (ref <200)
HDL: 68 mg/dL (ref >=50)
LDL Cholesterol (Calc): 92 mg/dL (calc)
Non-HDL Cholesterol (Calc): 108 mg/dL (calc) (ref <130)
Total CHOL/HDL Ratio: 2.6 (calc) (ref <5.0)
Triglycerides: 75 mg/dL (ref <150)

## 2021-02-09 LAB — CBC WITH DIFFERENTIAL/PLATELET
Absolute Monocytes: 558 cells/uL (ref 200–950)
Basophils Absolute: 30 cells/uL (ref 0–200)
Basophils Relative: 0.5 %
Eosinophils Absolute: 246 cells/uL (ref 15–500)
Eosinophils Relative: 4.1 %
HCT: 41.1 % (ref 35.0–45.0)
Hemoglobin: 13.4 g/dL (ref 11.7–15.5)
Lymphs Abs: 1734 cells/uL (ref 850–3900)
MCH: 28.8 pg (ref 27.0–33.0)
MCHC: 32.6 g/dL (ref 32.0–36.0)
MCV: 88.2 fL (ref 80.0–100.0)
MPV: 11.2 fL (ref 7.5–12.5)
Monocytes Relative: 9.3 %
Neutro Abs: 3432 cells/uL (ref 1500–7800)
Neutrophils Relative %: 57.2 %
Platelets: 160 10*3/uL (ref 140–400)
RBC: 4.66 10*6/uL (ref 3.80–5.10)
RDW: 12.3 % (ref 11.0–15.0)
Total Lymphocyte: 28.9 %
WBC: 6 10*3/uL (ref 3.8–10.8)

## 2021-02-09 LAB — URINE CULTURE
MICRO NUMBER:: 11617577
SPECIMEN QUALITY:: ADEQUATE

## 2021-02-09 LAB — URINALYSIS W MICROSCOPIC + REFLEX CULTURE
Bacteria, UA: NONE SEEN /HPF
Bilirubin Urine: NEGATIVE
Glucose, UA: NEGATIVE
Hgb urine dipstick: NEGATIVE
Hyaline Cast: NONE SEEN /LPF
Ketones, ur: NEGATIVE
Nitrites, Initial: NEGATIVE
Protein, ur: NEGATIVE
Specific Gravity, Urine: 1.014 (ref 1.001–1.03)
Squamous Epithelial / HPF: NONE SEEN /HPF (ref <=5)
WBC, UA: NONE SEEN /HPF (ref 0–5)
pH: 6 (ref 5.0–8.0)

## 2021-02-09 LAB — CULTURE INDICATED

## 2021-02-09 MED ORDER — NITROFURANTOIN MONOHYD MACRO 100 MG PO CAPS: 100.0000 mg | ORAL_CAPSULE | Freq: Two times a day (BID) | ORAL | 0 refills | Status: DC

## 2021-02-10 NOTE — Addendum Note (Signed)
Addended byGarnet Sierras A on: 02/10/2021 02:00 PM   Modules accepted: Orders

## 2021-02-11 DIAGNOSIS — L905 Scar conditions and fibrosis of skin: Secondary | ICD-10-CM | POA: Diagnosis not present

## 2021-02-11 DIAGNOSIS — Z85828 Personal history of other malignant neoplasm of skin: Secondary | ICD-10-CM | POA: Diagnosis not present

## 2021-02-14 ENCOUNTER — Other Ambulatory Visit: Payer: Self-pay | Admitting: Adult Health Nurse Practitioner

## 2021-02-14 DIAGNOSIS — Z1231 Encounter for screening mammogram for malignant neoplasm of breast: Secondary | ICD-10-CM

## 2021-02-25 DIAGNOSIS — R238 Other skin changes: Secondary | ICD-10-CM | POA: Diagnosis not present

## 2021-02-25 DIAGNOSIS — D485 Neoplasm of uncertain behavior of skin: Secondary | ICD-10-CM | POA: Diagnosis not present

## 2021-02-25 DIAGNOSIS — L905 Scar conditions and fibrosis of skin: Secondary | ICD-10-CM | POA: Diagnosis not present

## 2021-03-14 ENCOUNTER — Ambulatory Visit: Payer: Medicare Other

## 2021-03-15 ENCOUNTER — Other Ambulatory Visit: Payer: Self-pay

## 2021-03-15 ENCOUNTER — Ambulatory Visit (INDEPENDENT_AMBULATORY_CARE_PROVIDER_SITE_OTHER): Payer: Medicare Other | Admitting: *Deleted

## 2021-03-15 VITALS — BP 118/72 | HR 63 | Temp 97.3°F | Wt 144.6 lb

## 2021-03-15 DIAGNOSIS — N3000 Acute cystitis without hematuria: Secondary | ICD-10-CM

## 2021-03-15 NOTE — Progress Notes (Signed)
Patient here for a NV to recheck urine and culture. Patient finished her Macrobid and has no complaints.

## 2021-03-16 LAB — URINE CULTURE
MICRO NUMBER:: 11761035
SPECIMEN QUALITY:: ADEQUATE

## 2021-03-16 LAB — URINALYSIS, ROUTINE W REFLEX MICROSCOPIC
Bilirubin Urine: NEGATIVE
Glucose, UA: NEGATIVE
Hgb urine dipstick: NEGATIVE
Ketones, ur: NEGATIVE
Leukocytes,Ua: NEGATIVE
Nitrite: NEGATIVE
Protein, ur: NEGATIVE
Specific Gravity, Urine: 1.006 (ref 1.001–1.03)
pH: 7 (ref 5.0–8.0)

## 2021-03-17 NOTE — Progress Notes (Signed)
============================================================ -   Test results slightly outside the reference range are not unusual. If there is anything important, I will review this with you,  otherwise it is considered normal test values.  If you have further questions,  please do not hesitate to contact me at the office or via My Chart.  ============================================================ ============================================================  -  Urine OK now - No Infection   ============================================================ ============================================================

## 2021-03-23 ENCOUNTER — Encounter: Payer: Self-pay | Admitting: Internal Medicine

## 2021-06-15 ENCOUNTER — Other Ambulatory Visit: Payer: Self-pay

## 2021-06-15 ENCOUNTER — Ambulatory Visit (INDEPENDENT_AMBULATORY_CARE_PROVIDER_SITE_OTHER): Payer: Medicare Other | Admitting: Internal Medicine

## 2021-06-15 ENCOUNTER — Encounter: Payer: Self-pay | Admitting: Internal Medicine

## 2021-06-15 VITALS — BP 115/66 | HR 69 | Temp 97.6°F | Resp 14 | Ht 64.0 in | Wt 143.9 lb

## 2021-06-15 DIAGNOSIS — L0291 Cutaneous abscess, unspecified: Secondary | ICD-10-CM | POA: Diagnosis not present

## 2021-06-15 DIAGNOSIS — E782 Mixed hyperlipidemia: Secondary | ICD-10-CM | POA: Diagnosis not present

## 2021-06-15 DIAGNOSIS — I700 Atherosclerosis of aorta: Secondary | ICD-10-CM

## 2021-06-15 DIAGNOSIS — I10 Essential (primary) hypertension: Secondary | ICD-10-CM | POA: Diagnosis not present

## 2021-06-15 DIAGNOSIS — L923 Foreign body granuloma of the skin and subcutaneous tissue: Secondary | ICD-10-CM | POA: Diagnosis not present

## 2021-06-15 DIAGNOSIS — E559 Vitamin D deficiency, unspecified: Secondary | ICD-10-CM

## 2021-06-15 DIAGNOSIS — R7309 Other abnormal glucose: Secondary | ICD-10-CM | POA: Diagnosis not present

## 2021-06-15 DIAGNOSIS — Z79899 Other long term (current) drug therapy: Secondary | ICD-10-CM | POA: Diagnosis not present

## 2021-06-15 NOTE — Patient Instructions (Signed)

## 2021-06-15 NOTE — Progress Notes (Signed)
Future Appointments  Date Time Provider [place]  06/15/2021 11:30 AM Unk Pinto, MD [facility] None  02/07/2022  - CPE  2:00 PM Garnet Sierras, NP [facility] None    History of Present Illness:       This very nice 87 y.o. WWF presents for 3 month follow up with HTN, HLD, Pre-Diabetes and Vitamin D Deficiency.   In 1998, she had excision of a RUL lung Ca & remains in apparent remission.        Patient is treated for HTN  (2000) & BP has been controlled at home. Today's BP is at goal -  115/66. Patient has had no complaints of any cardiac type chest pain, palpitations, dyspnea / orthopnea / PND, dizziness, claudication, or dependent edema.       Hyperlipidemia is controlled with diet & meds. Patient denies myalgias or other med SE's. Last Lipids were at goal:  Lab Results  Component Value Date   CHOL 176 02/07/2021   HDL 68 02/07/2021   LDLCALC 92 02/07/2021   TRIG 75 02/07/2021   CHOLHDL 2.6 02/07/2021     Also, the patient has history of PreDiabetes (A1c 5.9% /2009) and has had no symptoms of reactive hypoglycemia, diabetic polys, paresthesias or visual blurring.  Last A1c w  as not at goal:  Lab Results  Component Value Date   HGBA1C 5.8 (H) 10/07/2020        Further, the patient also has history of Vitamin D Deficiency (A1c 5.9% /2009) and supplements vitamin D without any suspected side-effects. Last vitamin D was at goal:   Lab Results  Component Value Date   VD25OH 102 (H) 10/07/2020     Current Outpatient Medications on File Prior to Visit  Medication Sig   aspirin 81 MG tablet TAKE daily.   cetirizine 10 MG tablet Takes 1 tablet Daily   Cholecalciferol 5000 u Take 2 capsules Daily   CINNAMON 1,000 mg  Take daily.   VITAMIN B-12 1000 MCG SL Takes 1 tab SL Daily   TYLENOL PM 25-500 MG  Take 1 tablet at bedtime.    hydrochlorothiazide 25 MG tablet Take  1 tablet  Daily     meloxicam  15 MG tablet Take 1/2 to 1  tablet Daily     metoprolol tartrate 50 MG tablet TAKE 1 TABLET TWICE DAILY    montelukast 10 MG tablet Take  1 tablet  Daily  for Allergies   simvastatin  40 MG tablet Take  1 tablet  at Bedtime      Allergies  Allergen Reactions   Ace Inhibitors Cough   Calcium-Containing Compounds Other (See Comments)    constpation   Lactose Intolerance (Gi) Diarrhea   Polysporin [Bacitracin-Polymyxin B] Rash    Polysporin or neosporin gave her a rash, couldn't remember which     PMHx:   Past Medical History:  Diagnosis Date   COPD (chronic obstructive pulmonary disease) with emphysema ([place])    COVID-19 12/09/2019   Hyperlipidemia    Hypertension    Lung cancer, upper lobe ([place]) 12/04/1996   RUL   Osteoporosis    S/P partial lobectomy of lung    [id number]   Thigh shingles 12/04/1996   Thoracic aorta atherosclerosis ([place])    Vitamin D deficiency      Immunization History  Administered Date(s) Administered   Influenza, High Dose Seasonal PF 09/15/2015, 10/02/2017, 07/27/2019, 10/07/2020   Influenza-Unspecified 08/04/2012, 09/03/2013, 09/03/2018   PFIZER(Purple Top)SARS-COV-2  Vaccination 01/26/2020, 02/16/2020   Pneumococcal Conjugate-13 07/21/2014   Pneumococcal Polysaccharide-23 10/30/2005   Td 04/03/2013     Past Surgical History:  Procedure Laterality Date   CARPAL TUNNEL RELEASE Right    2009   LUNG REMOVAL, PARTIAL Right    RUL 1998   OLECRANON BURSECTOMY Left 03/20/2017   Procedure: EXICISION OLECRANON BURSA LEFT ELBOW;  Surgeon: Daryll Brod, MD;  Location: [place] [facility];  Service: Orthopedics;  Laterality: Left;  with scb block in preop   SHOULDER ADHESION RELEASE Right    2004    FHx:    Reviewed / unchanged  SHx:    Reviewed / unchanged   Systems Review:  Constitutional: Denies fever, chills, wt changes, headaches, insomnia, fatigue, night sweats, change in appetite. Eyes: Denies redness, blurred vision, diplopia, discharge, itchy, watery eyes.  ENT: Denies  discharge, congestion, post nasal drip, epistaxis, sore throat, earache, hearing loss, dental pain, tinnitus, vertigo, sinus pain, snoring.  CV: Denies chest pain, palpitations, irregular heartbeat, syncope, dyspnea, diaphoresis, orthopnea, PND, claudication or edema. Respiratory: denies cough, dyspnea, DOE, pleurisy, hoarseness, laryngitis, wheezing.  Gastrointestinal: Denies dysphagia, odynophagia, heartburn, reflux, water brash, abdominal pain or cramps, nausea, vomiting, bloating, diarrhea, constipation, hematemesis, melena, hematochezia  or hemorrhoids. Genitourinary: Denies dysuria, frequency, urgency, nocturia, hesitancy, discharge, hematuria or flank pain. Musculoskeletal: Denies arthralgias, myalgias, stiffness, jt. swelling, pain, limping or strain/sprain.  Skin: Denies pruritus, rash, hives, warts, acne, eczema or change in skin lesion(s). Neuro: No weakness, tremor, incoordination, spasms, paresthesia or pain. Psychiatric: Denies confusion, memory loss or sensory loss. Endo: Denies change in weight, skin or hair change.  Heme/Lymph: No excessive bleeding, bruising or enlarged lymph nodes.  Physical Exam  BP 115/66   Pulse 69   Temp 97.6 F (36.4 C)   Resp 14   Ht 5\' 4"  (1.626 m)   Wt 143 lb 14.4 oz (65.3 kg)   SpO2 96%   BMI 24.70 kg/m   Appears  well nourished, well groomed  and in no distress.  Eyes: PERRLA, EOMs, conjunctiva no swelling or erythema. Sinuses: No frontal/maxillary tenderness ENT/Mouth: EAC's clear, TM's nl w/o erythema, bulging. Nares clear w/o erythema, swelling, exudates. Oropharynx clear without erythema or exudates. Oral hygiene is good. Tongue normal, non obstructing. Hearing intact.  Neck: Supple. Thyroid not palpable. Car 2+/2+ without bruits, nodes or JVD. Chest: Respirations nl with BS clear & equal w/o rales, rhonchi, wheezing or stridor.  Cor: Heart sounds normal w/ regular rate and rhythm without sig. murmurs, gallops, clicks or rubs.  Peripheral pulses normal and equal  without edema.  Abdomen: Soft & bowel sounds normal. Non-tender w/o guarding, rebound, hernias, masses or organomegaly.  Lymphatics: Unremarkable.  Musculoskeletal: Full ROM all peripheral extremities, joint stability, 5/5 strength and normal gait.  Skin: Warm, dry without exposed rashes, lesions or ecchymosis apparent.  Neuro: Cranial nerves intact, reflexes equal bilaterally. Sensory-motor testing grossly intact. Tendon reflexes grossly intact.  Pysch: Alert & oriented x 3.  Insight and judgement nl & appropriate. No ideations.  Assessment and Plan:  1. Essential hypertension  - Continue medication, monitor blood pressure at home.  - Continue DASH diet.  Reminder to go to the ER if any CP,  SOB, nausea, dizziness, severe HA, changes vision/speech.   - CBC with Differential/Platelet - COMPLETE METABOLIC PANEL WITH GFR - Magnesium - TSH  2. Hyperlipidemia, mixed  - Continue diet/meds, exercise,& lifestyle modifications.  - Continue monitor periodic cholesterol/liver & renal functions     - Lipid panel -  TSH  3. Abnormal glucose  - Continue diet, exercise  - Lifestyle modifications.  - Monitor appropriate labs  - Hmolobin A1c - Insulin, random  4. Vitamin D deficiency   - Continue supplementation  - VITAMIN D 25 Hydroxy   5. Atherosclerosis of aorta ([place]) BY ct SCAN IN 2015  - Lipid panel  6. Medication management  - CBC with Differential/Platelet - COMPLETE METABOLIC PANEL WITH GFR - Magnesium - Lipid panel - TSH - Hemoglobin A1c - Insulin, random - VITAMIN D 25 Hydroxy          Discussed  regular exercise, BP monitoring, weight control to achieve/maintain BMI less than 25 and discussed med and SE's. Recommended labs to assess and monitor clinical status with further disposition pending results of labs.  I discussed the assessment and treatment plan with the patient. The patient was provided an opportunity to ask  questions and all were answered. The patient agreed with the plan and demonstrated an understanding of the instructions.  I provided over 30 minutes of exam, counseling, chart review and  complex critical decision making.        The patient was advised to call back or seek an in-person evaluation if the symptoms worsen or if the condition fails to improve as anticipated.   Kirtland Bouchard, MD

## 2021-06-16 LAB — HEMOGLOBIN A1C
Hgb A1c MFr Bld: 5.7 % of total Hgb — ABNORMAL HIGH (ref <5.7)
Mean Plasma Glucose: 117 mg/dL
eAG (mmol/L): 6.5 mmol/L

## 2021-06-16 LAB — CBC WITH DIFFERENTIAL/PLATELET
Absolute Monocytes: 510 cells/uL (ref 200–950)
Basophils Absolute: 41 cells/uL (ref 0–200)
Basophils Relative: 0.6 %
Eosinophils Absolute: 88 cells/uL (ref 15–500)
Eosinophils Relative: 1.3 %
HCT: 43.2 % (ref 35.0–45.0)
Hemoglobin: 14 g/dL (ref 11.7–15.5)
Lymphs Abs: 1435 cells/uL (ref 850–3900)
MCH: 28.7 pg (ref 27.0–33.0)
MCHC: 32.4 g/dL (ref 32.0–36.0)
MCV: 88.7 fL (ref 80.0–100.0)
MPV: 10.8 fL (ref 7.5–12.5)
Monocytes Relative: 7.5 %
Neutro Abs: 4726 cells/uL (ref 1500–7800)
Neutrophils Relative %: 69.5 %
Platelets: 183 10*3/uL (ref 140–400)
RBC: 4.87 10*6/uL (ref 3.80–5.10)
RDW: 11.9 % (ref 11.0–15.0)
Total Lymphocyte: 21.1 %
WBC: 6.8 10*3/uL (ref 3.8–10.8)

## 2021-06-16 LAB — COMPLETE METABOLIC PANEL WITH GFR
AG Ratio: 1.6 (calc) (ref 1.0–2.5)
ALT: 12 U/L (ref 6–29)
AST: 20 U/L (ref 10–35)
Albumin: 4.4 g/dL (ref 3.6–5.1)
Alkaline phosphatase (APISO): 66 U/L (ref 37–153)
BUN: 22 mg/dL (ref 7–25)
CO2: 32 mmol/L (ref 20–32)
Calcium: 9.9 mg/dL (ref 8.6–10.4)
Chloride: 101 mmol/L (ref 98–110)
Creat: 0.79 mg/dL (ref 0.60–0.95)
Globulin: 2.8 g/dL (calc) (ref 1.9–3.7)
Glucose, Bld: 147 mg/dL — ABNORMAL HIGH (ref 65–99)
Potassium: 3.7 mmol/L (ref 3.5–5.3)
Sodium: 144 mmol/L (ref 135–146)
Total Bilirubin: 0.5 mg/dL (ref 0.2–1.2)
Total Protein: 7.2 g/dL (ref 6.1–8.1)
eGFR: 73 mL/min/{1.73_m2} (ref >=60)

## 2021-06-16 LAB — LIPID PANEL
Cholesterol: 185 mg/dL (ref <200)
HDL: 62 mg/dL (ref >=50)
LDL Cholesterol (Calc): 103 mg/dL (calc) — ABNORMAL HIGH
Non-HDL Cholesterol (Calc): 123 mg/dL (calc) (ref <130)
Total CHOL/HDL Ratio: 3 (calc) (ref <5.0)
Triglycerides: 103 mg/dL (ref <150)

## 2021-06-16 LAB — VITAMIN D 25 HYDROXY (VIT D DEFICIENCY, FRACTURES): Vit D, 25-Hydroxy: 129 ng/mL — ABNORMAL HIGH (ref 30–100)

## 2021-06-16 LAB — TSH: TSH: 1.41 mIU/L (ref 0.40–4.50)

## 2021-06-16 LAB — INSULIN, RANDOM: Insulin: 29.5 u[IU]/mL — ABNORMAL HIGH

## 2021-06-16 LAB — MAGNESIUM: Magnesium: 1.6 mg/dL (ref 1.5–2.5)

## 2021-06-16 NOTE — Progress Notes (Signed)
============================================================ -   Test results slightly outside the reference range are not unusual. If there is anything important, I will review this with you,  otherwise it is considered normal test values.  If you have further questions,  please do not hesitate to contact me at the office or via My Chart.  ============================================================ ============================================================ Total Chol =  Excellent   - Very low risk for Heart Attack  / Stroke ========================================================  - LDL Chol = 103  is up slightly from previous range of 81 - 92 . So please   Get on a stricter low cholesterol diet   - Cholesterol only comes from animal sources  - ie. meat, dairy, egg yolks  - Eat all the vegetables you want.  - Avoid meat, especially red meat - Beef AND Pork .  - Avoid cheese & dairy - milk & ice cream.     - Cheese is the most concentrated form of trans-fats which  is the worst thing to clog up our arteries.   - Veggie cheese is OK which can be found in the fresh  produce section at [employer] or [employer] or [employer] ============================================================ ============================================================  -  A1c = 5.7% - Blood sugar and A1c are borderline elevated in the borderline and  early or pre-diabetes range which has the same   300% increased risk for heart attack, stroke, cancer and   alzheimer- type vascular dementia as full blown diabetes.   But the good news is that diet, exercise with  weight loss can cure the early diabetes at this point. ============================================================ ============================================================  -  Vitamin D = 129 is too high                               - So Please stop your 10,000 units /day for 1 week, then                    - After 1 week  ReStart and only take 1 capsule 5,000 units /daily  ============================================================ ============================================================  -  All Else - CBC - Kidneys - Electrolytes - Liver - Magnesium & Thyroid    - all  Normal / OK ============================================================ ============================================================

## 2021-06-28 ENCOUNTER — Other Ambulatory Visit: Payer: Self-pay | Admitting: Internal Medicine

## 2021-06-28 ENCOUNTER — Other Ambulatory Visit: Payer: Self-pay | Admitting: Adult Health Nurse Practitioner

## 2021-06-28 DIAGNOSIS — I10 Essential (primary) hypertension: Secondary | ICD-10-CM

## 2021-07-05 DIAGNOSIS — L0291 Cutaneous abscess, unspecified: Secondary | ICD-10-CM | POA: Diagnosis not present

## 2021-07-05 DIAGNOSIS — I8311 Varicose veins of right lower extremity with inflammation: Secondary | ICD-10-CM | POA: Diagnosis not present

## 2021-07-25 ENCOUNTER — Ambulatory Visit
Admission: RE | Admit: 2021-07-25 | Discharge: 2021-07-25 | Disposition: A | Discharge status: Home / Self Care | Payer: Medicare Other | Source: Ambulatory Visit | Attending: Adult Health Nurse Practitioner | Admitting: Adult Health Nurse Practitioner

## 2021-07-25 ENCOUNTER — Other Ambulatory Visit: Payer: Self-pay

## 2021-07-25 DIAGNOSIS — M810 Age-related osteoporosis without current pathological fracture: Secondary | ICD-10-CM

## 2021-07-25 DIAGNOSIS — M8589 Other specified disorders of bone density and structure, multiple sites: Secondary | ICD-10-CM | POA: Diagnosis not present

## 2021-07-25 DIAGNOSIS — Z1231 Encounter for screening mammogram for malignant neoplasm of breast: Secondary | ICD-10-CM | POA: Diagnosis not present

## 2021-07-25 DIAGNOSIS — Z780 Asymptomatic menopausal state: Secondary | ICD-10-CM | POA: Diagnosis not present

## 2021-08-01 DIAGNOSIS — Z961 Presence of intraocular lens: Secondary | ICD-10-CM | POA: Diagnosis not present

## 2021-08-12 DIAGNOSIS — L570 Actinic keratosis: Secondary | ICD-10-CM | POA: Diagnosis not present

## 2021-08-12 DIAGNOSIS — L723 Sebaceous cyst: Secondary | ICD-10-CM | POA: Diagnosis not present

## 2021-08-12 DIAGNOSIS — Z86007 Personal history of in-situ neoplasm of skin: Secondary | ICD-10-CM | POA: Diagnosis not present

## 2021-08-12 DIAGNOSIS — L820 Inflamed seborrheic keratosis: Secondary | ICD-10-CM | POA: Diagnosis not present

## 2021-08-12 DIAGNOSIS — L298 Other pruritus: Secondary | ICD-10-CM | POA: Diagnosis not present

## 2021-08-12 DIAGNOSIS — Z08 Encounter for follow-up examination after completed treatment for malignant neoplasm: Secondary | ICD-10-CM | POA: Diagnosis not present

## 2021-08-12 DIAGNOSIS — D2272 Melanocytic nevi of left lower limb, including hip: Secondary | ICD-10-CM | POA: Diagnosis not present

## 2021-08-12 DIAGNOSIS — L821 Other seborrheic keratosis: Secondary | ICD-10-CM | POA: Diagnosis not present

## 2021-08-12 DIAGNOSIS — L814 Other melanin hyperpigmentation: Secondary | ICD-10-CM | POA: Diagnosis not present

## 2021-08-12 DIAGNOSIS — D225 Melanocytic nevi of trunk: Secondary | ICD-10-CM | POA: Diagnosis not present

## 2021-08-12 DIAGNOSIS — D485 Neoplasm of uncertain behavior of skin: Secondary | ICD-10-CM | POA: Diagnosis not present

## 2021-09-15 NOTE — Progress Notes (Signed)
FOLLOW UP  Assessment and Plan:   Hypertension Well controlled with current medications  Monitor blood pressure at home; patient to call if consistently greater than 130/80 Continue DASH diet.   Reminder to go to the ER if any CP, SOB, nausea, dizziness, severe HA, changes vision/speech, left arm numbness and tingling and jaw pain. CBC  Pulmonary emphysema Monitor, CXR 02/07/21 stable  Aortic Atherosclerosis Control blood pressure, cholesterol, glucose, increase exercise.   Cholesterol Currently at goal;  Continue low cholesterol diet and exercise.  Check lipid panel.  CMP  Abnormal glucose Continue medication: Continue diet and exercise.  Perform daily foot/skin check, notify office of any concerning changes.   Obesity with co morbidities Long discussion about weight loss, diet, and exercise Recommended diet heavy in fruits and veggies and low in animal meats, cheeses, and dairy products, appropriate calorie intake  Flu vaccine Need High dose flu vaccine given  Vitamin D Def At goal at last visit; continue supplementation to maintain goal of 60-100 Check Vit D level  Continue diet and meds as discussed. Further disposition pending results of labs. Discussed med's effects and SE's.   Over 30 minutes of exam, counseling, chart review, and critical decision making was performed.   Future Appointments  Date Time Provider [place]  02/07/2022  2:00 PM Magda Bernheim, NP [facility] None    ----------------------------------------------------------------------------------------------------------------------  HPI 87 y.o. female  presents for 3 month follow up on hypertension, cholesterol, diabetes, weight and vitamin D deficiency.   Patient is going to [place] with family for a reunion tomorrow, does wish to get flu shot today   BMI is Body mass index is 24.51 kg/m., she has been working on diet and exercise. Wt Readings from Last 3 Encounters:  09/19/21 142 lb  12.8 oz (64.8 kg)  06/15/21 143 lb 14.4 oz (65.3 kg)  03/15/21 144 lb 9.6 oz (65.6 kg)    Her blood pressure has been controlled at home, today their BP is BP: 100/70 BP Readings from Last 3 Encounters:  09/19/21 100/70  06/15/21 115/66  03/15/21 118/72     She does workout. She denies chest pain, shortness of breath, dizziness.   She is on cholesterol medication Simvastatin and denies myalgias. Her cholesterol is not at goal. The cholesterol last visit was:   Lab Results  Component Value Date   CHOL 185 06/15/2021   HDL 62 06/15/2021   LDLCALC 103 (H) 06/15/2021   TRIG 103 06/15/2021   CHOLHDL 3.0 06/15/2021    She has been working on diet and exercise for prediabetes, and denies paresthesia of the feet, polydipsia, and polyuria. Last A1C in the office was:  Lab Results  Component Value Date   HGBA1C 5.7 (H) 06/15/2021   Patient is on Vitamin D supplement.   Lab Results  Component Value Date   VD25OH 129 (H) 06/15/2021        Current Medications:  Current Outpatient Medications on File Prior to Visit  Medication Sig   aspirin 81 MG chewable tablet Chew 81 mg by mouth daily.   cetirizine (ZYRTEC) 10 MG tablet Takes 1 tablet Daily   Cholecalciferol 125 MCG (5000 UT) capsule Take 2 capsules Daily   CINNAMON PO Take 1,000 mg by mouth daily.   Cyanocobalamin (VITAMIN B-12) 1000 MCG SUBL Takes 1 tab SL Daily   hydrochlorothiazide (HYDRODIURIL) 25 MG tablet Take  1 tablet  Daily  for BP & Fluid Retention / Ankle Swelling - Patient knows to take by mouth  metoprolol tartrate (LOPRESSOR) 50 MG tablet Take  1 tablet  2 x /day (every 12 hours)  for BP /Patient knows to take by mouth   montelukast (SINGULAIR) 10 MG tablet Take  1 tablet  Daily  for Allergies   simvastatin (ZOCOR) 40 MG tablet Take  1 tablet  at Bedtime  for Cholesterol.   diphenhydramine-acetaminophen (TYLENOL PM) 25-500 MG TABS Take 1 tablet by mouth at bedtime. Take every night per patient (Patient not taking:  Reported on 09/19/2021)   No current facility-administered medications on file prior to visit.     Allergies:  Allergies  Allergen Reactions   Ace Inhibitors Cough   Calcium-Containing Compounds Other (See Comments)    constpation   Lactose Intolerance (Gi) Diarrhea   Polysporin [Bacitracin-Polymyxin B] Rash    Polysporin or neosporin gave her a rash, couldn't remember which     Medical History:  Past Medical History:  Diagnosis Date   COPD (chronic obstructive pulmonary disease) with emphysema ([place])    COVID-19 12/09/2019   Hyperlipidemia    Hypertension    Lung cancer, upper lobe ([place]) 12/04/1996   RUL   Osteoporosis    S/P partial lobectomy of lung    [id number]   Thigh shingles 12/04/1996   Thoracic aorta atherosclerosis ([place])    Vitamin D deficiency    Family history- Reviewed and unchanged Social history- Reviewed and unchanged   Review of Systems:  Review of Systems  Constitutional:  Negative for chills, fever and weight loss.  HENT:  Negative for congestion and hearing loss.   Eyes:  Negative for blurred vision and double vision.  Respiratory:  Negative for cough and shortness of breath.   Cardiovascular:  Negative for chest pain, palpitations, orthopnea and leg swelling.  Gastrointestinal:  Negative for abdominal pain, constipation, diarrhea, heartburn, nausea and vomiting.  Genitourinary:  Negative for dysuria.  Musculoskeletal:  Negative for falls, joint pain and myalgias.  Skin:  Negative for rash.  Neurological:  Negative for dizziness, tingling, tremors, loss of consciousness and headaches.  Endo/Heme/Allergies:  Does not bruise/bleed easily.  Psychiatric/Behavioral:  Negative for depression, memory loss and suicidal ideas.      Physical Exam: BP 100/70   Pulse 69   Temp (!) 97.3 F (36.3 C)   Wt 142 lb 12.8 oz (64.8 kg)   SpO2 96%   BMI 24.51 kg/m  Wt Readings from Last 3 Encounters:  09/19/21 142 lb 12.8 oz (64.8 kg)  06/15/21 143 lb 14.4 oz  (65.3 kg)  03/15/21 144 lb 9.6 oz (65.6 kg)   General Appearance: Well nourished, in no apparent distress. Eyes: PERRLA, EOMs, conjunctiva no swelling or erythema Sinuses: No Frontal/maxillary tenderness ENT/Mouth: Ext aud canals clear, TMs without erythema, bulging. No erythema, swelling, or exudate on post pharynx.  Tonsils not swollen or erythematous. Hard of hearing, somewhat improved with hearing aids Neck: Supple, thyroid normal.  Respiratory: Respiratory effort normal, BS equal bilaterally without rales, rhonchi, wheezing or stridor.  Cardio: RRR with no MRGs. Brisk peripheral pulses without edema.  Abdomen: Soft, + BS.  Non tender, no guarding, rebound, hernias, masses. Lymphatics: Non tender without lymphadenopathy.  Musculoskeletal: Full ROM, 5/5 strength, Normal gait Skin: Warm, dry without rashes, lesions, ecchymosis.  Neuro: Cranial nerves intact. No cerebellar symptoms.  Psych: Awake and oriented X 3, normal affect, Insight and Judgment appropriate.    Magda Bernheim, NP 11:15 AM [facility]

## 2021-09-19 ENCOUNTER — Other Ambulatory Visit: Payer: Self-pay

## 2021-09-19 ENCOUNTER — Encounter: Payer: Self-pay | Admitting: Nurse Practitioner

## 2021-09-19 ENCOUNTER — Ambulatory Visit (INDEPENDENT_AMBULATORY_CARE_PROVIDER_SITE_OTHER): Payer: Medicare Other | Admitting: Nurse Practitioner

## 2021-09-19 VITALS — BP 100/70 | HR 69 | Temp 97.3°F | Wt 142.8 lb

## 2021-09-19 DIAGNOSIS — Z79899 Other long term (current) drug therapy: Secondary | ICD-10-CM | POA: Diagnosis not present

## 2021-09-19 DIAGNOSIS — I700 Atherosclerosis of aorta: Secondary | ICD-10-CM | POA: Diagnosis not present

## 2021-09-19 DIAGNOSIS — E559 Vitamin D deficiency, unspecified: Secondary | ICD-10-CM

## 2021-09-19 DIAGNOSIS — Z23 Encounter for immunization: Secondary | ICD-10-CM | POA: Diagnosis not present

## 2021-09-19 DIAGNOSIS — R7309 Other abnormal glucose: Secondary | ICD-10-CM

## 2021-09-19 DIAGNOSIS — I10 Essential (primary) hypertension: Secondary | ICD-10-CM | POA: Diagnosis not present

## 2021-09-19 DIAGNOSIS — J439 Emphysema, unspecified: Secondary | ICD-10-CM

## 2021-09-19 DIAGNOSIS — E782 Mixed hyperlipidemia: Secondary | ICD-10-CM | POA: Diagnosis not present

## 2021-09-19 DIAGNOSIS — E611 Iron deficiency: Secondary | ICD-10-CM | POA: Diagnosis not present

## 2021-09-19 NOTE — Patient Instructions (Signed)

## 2021-09-20 LAB — COMPLETE METABOLIC PANEL WITH GFR
AG Ratio: 1.6 (calc) (ref 1.0–2.5)
ALT: 9 U/L (ref 6–29)
AST: 19 U/L (ref 10–35)
Albumin: 4.1 g/dL (ref 3.6–5.1)
Alkaline phosphatase (APISO): 66 U/L (ref 37–153)
BUN: 22 mg/dL (ref 7–25)
CO2: 34 mmol/L — ABNORMAL HIGH (ref 20–32)
Calcium: 9.5 mg/dL (ref 8.6–10.4)
Chloride: 102 mmol/L (ref 98–110)
Creat: 0.8 mg/dL (ref 0.60–0.95)
Globulin: 2.6 g/dL (calc) (ref 1.9–3.7)
Glucose, Bld: 97 mg/dL (ref 65–99)
Potassium: 3.6 mmol/L (ref 3.5–5.3)
Sodium: 142 mmol/L (ref 135–146)
Total Bilirubin: 0.4 mg/dL (ref 0.2–1.2)
Total Protein: 6.7 g/dL (ref 6.1–8.1)
eGFR: 72 mL/min/{1.73_m2} (ref >=60)

## 2021-09-20 LAB — CBC WITH DIFFERENTIAL/PLATELET
Absolute Monocytes: 492 cells/uL (ref 200–950)
Basophils Absolute: 18 cells/uL (ref 0–200)
Basophils Relative: 0.4 %
Eosinophils Absolute: 129 cells/uL (ref 15–500)
Eosinophils Relative: 2.8 %
HCT: 40.9 % (ref 35.0–45.0)
Hemoglobin: 13.2 g/dL (ref 11.7–15.5)
Lymphs Abs: 1357 cells/uL (ref 850–3900)
MCH: 28.6 pg (ref 27.0–33.0)
MCHC: 32.3 g/dL (ref 32.0–36.0)
MCV: 88.5 fL (ref 80.0–100.0)
MPV: 10.5 fL (ref 7.5–12.5)
Monocytes Relative: 10.7 %
Neutro Abs: 2604 cells/uL (ref 1500–7800)
Neutrophils Relative %: 56.6 %
Platelets: 183 10*3/uL (ref 140–400)
RBC: 4.62 10*6/uL (ref 3.80–5.10)
RDW: 12.5 % (ref 11.0–15.0)
Total Lymphocyte: 29.5 %
WBC: 4.6 10*3/uL (ref 3.8–10.8)

## 2021-09-20 LAB — LIPID PANEL
Cholesterol: 168 mg/dL (ref <200)
HDL: 62 mg/dL (ref >=50)
LDL Cholesterol (Calc): 87 mg/dL (calc)
Non-HDL Cholesterol (Calc): 106 mg/dL (calc) (ref <130)
Total CHOL/HDL Ratio: 2.7 (calc) (ref <5.0)
Triglycerides: 95 mg/dL (ref <150)

## 2021-09-20 LAB — VITAMIN D 25 HYDROXY (VIT D DEFICIENCY, FRACTURES): Vit D, 25-Hydroxy: 134 ng/mL — ABNORMAL HIGH (ref 30–100)

## 2021-10-10 ENCOUNTER — Other Ambulatory Visit: Payer: Self-pay | Admitting: Internal Medicine

## 2021-10-10 DIAGNOSIS — E782 Mixed hyperlipidemia: Secondary | ICD-10-CM

## 2021-12-01 ENCOUNTER — Other Ambulatory Visit: Payer: Self-pay

## 2021-12-01 ENCOUNTER — Encounter: Payer: Self-pay | Admitting: Adult Health

## 2021-12-01 ENCOUNTER — Ambulatory Visit: Payer: Medicare Other | Admitting: Adult Health

## 2021-12-01 DIAGNOSIS — U071 COVID-19: Secondary | ICD-10-CM | POA: Diagnosis not present

## 2021-12-01 DIAGNOSIS — Z20822 Contact with and (suspected) exposure to covid-19: Secondary | ICD-10-CM | POA: Diagnosis not present

## 2021-12-01 MED ORDER — NIRMATRELVIR/RITONAVIR (PAXLOVID)TABLET: 3.0000 | ORAL_TABLET | Freq: Two times a day (BID) | ORAL | 0 refills | Status: AC

## 2021-12-01 MED ORDER — CHOLECALCIFEROL 125 MCG (5000 UT) PO CAPS: ORAL_CAPSULE | ORAL | 99 refills | Status: DC

## 2021-12-01 MED ORDER — DEXAMETHASONE 1 MG PO TABS: ORAL_TABLET | ORAL | 0 refills | Status: DC

## 2021-12-01 MED ORDER — PROMETHAZINE-DM 6.25-15 MG/5ML PO SYRP: 5.0000 mL | ORAL_SOLUTION | Freq: Four times a day (QID) | ORAL | 1 refills | Status: DC | PRN

## 2021-12-01 NOTE — Progress Notes (Signed)
THIS ENCOUNTER IS A VIRTUAL VISIT DUE TO COVID-19 - PATIENT WAS NOT SEEN IN THE OFFICE.  PATIENT HAS CONSENTED TO VIRTUAL VISIT / TELEMEDICINE VISIT   Virtual Visit via telephone Note  I connected with  Penny [name] on 12/01/2021 by telephone.  I verified that I am speaking with the correct person using two identifiers.    I discussed the limitations of evaluation and management by telemedicine and the availability of in person appointments. The patient expressed understanding and agreed to proceed.  History of Present Illness:  There were no vitals taken for this visit. 87 y.o. patient with htn, COPD/emphysema following hx of lung cancer contacted office reporting URI sx. she tested positive by rapid home screen yesterday, also did drive through at covid 19 at [facility] today which were both positive. OV was conducted by telephone to minimize exposure. This patient was vaccinated for covid 19, last 2/2 in Spring 2021, not boosted since.   Sx began yesterday 11/30/2021, mild dry cough, rhinitis (clear, watery), has been fatigued. Tested + for covid yesterday in the afternoon. Sx stable today, cough still non-productive. Denies fever but mild intermittent chills. Denies HA. Having some back aching (also fell a few days ago). Denies dyspnea or wheezing.   Treatments tried so far: tylenol sinus, alka seltzer plus cold/flu  Exposures: unknown,   Daughter is with her  Last GFR was 72 on 09/19/2021. She is on simvastatin, last dose yesterday evening.     Medications   Current Outpatient Medications (Cardiovascular):    hydrochlorothiazide (HYDRODIURIL) 25 MG tablet, Take  1 tablet  Daily  for BP & Fluid Retention / Ankle Swelling - Patient knows to take by mouth   metoprolol tartrate (LOPRESSOR) 50 MG tablet, Take  1 tablet  2 x /day (every 12 hours)  for BP /Patient knows to take by mouth   simvastatin (ZOCOR) 40 MG tablet, TAKE ONE TABLET BY MOUTH EVERY NIGHT AT BEDTIME FOR  CHOLESTEROL  Current Outpatient Medications (Respiratory):    cetirizine (ZYRTEC) 10 MG tablet, Takes 1 tablet Daily   montelukast (SINGULAIR) 10 MG tablet, Take  1 tablet  Daily  for Allergies  Current Outpatient Medications (Analgesics):    aspirin 81 MG chewable tablet, Chew 81 mg by mouth daily.  Current Outpatient Medications (Hematological):    Cyanocobalamin (VITAMIN B-12) 1000 MCG SUBL, Takes 1 tab SL Daily  Current Outpatient Medications (Other):    Cholecalciferol 125 MCG (5000 UT) capsule, Take 2 capsules Daily   CINNAMON PO, Take 1,000 mg by mouth daily.   diphenhydramine-acetaminophen (TYLENOL PM) 25-500 MG TABS, Take 1 tablet by mouth at bedtime. Take every night per patient (Patient not taking: Reported on 09/19/2021)  Allergies:  Allergies  Allergen Reactions   Ace Inhibitors Cough   Calcium-Containing Compounds Other (See Comments)    constpation   Lactose Intolerance (Gi) Diarrhea   Polysporin [Bacitracin-Polymyxin B] Rash    Polysporin or neosporin gave her a rash, couldn't remember which    Problem list She has Essential hypertension; Hyperlipidemia, mixed; Personal history of lung cancer; Vitamin D deficiency; COPD (chronic obstructive pulmonary disease) with emphysema ([place]); Atherosclerosis of aorta ([place]) BY ct SCAN IN 2015; Osteoporosis; Abnormal glucose; and B12 deficiency on their problem list.   Social History:   reports that she quit smoking about 25 years ago. Her smoking use included cigarettes. She has never used smokeless tobacco. She reports current alcohol use of about 1.0 standard drink per week. She reports that she does  not use drugs.  Observations/Objective:  General : Well sounding patient in no apparent distress HEENT: no hoarseness, no cough for duration of visit Lungs: speaks in complete sentences, no audible wheezing, no apparent distress Neurological: alert, oriented x 3 Psychiatric: pleasant, judgement appropriate   Assessment and  Plan:  Covid 19 Covid 19 positive per rapid screening test x 2 Risk factors include: age, htn, COPD, hx of lung cancer, no recent booster Symptoms are: mild Due to co morbid conditions and risk factors, discussed antivirals - she is interested in paxlovid after discussion; may reduce risk of hosiptalization by up to 80%. GFR was reviewed. She was advised to hold simvastatin for at least 1 week after completion of antiviral.  Contact office if any SE Steroid taper also offered Immue support reviewed  Take tylenol PRN temp 101+ Push hydration Regular ambulation or calf exercises exercises for clot prevention and 81 mg ASA unless contraindicated Sx supportive therapy suggested Follow up via mychart or telephone if needed Advised patient obtain O2 monitor; present to ED if persistently <90% or with severe dyspnea, CP, fever uncontrolled by tylenol, confusion, sudden decline Should remain in isolation until at least 5 days from onset of sx, 24-48 hours fever free without tylenol, sx such as cough are improved.  -     nirmatrelvir/ritonavir EUA (PAXLOVID) 20 x 150 MG & 10 x 100MG  TABS; Take 3 tablets by mouth 2 (two) times daily for 5 days. Patient GFR is 72. Take nirmatrelvir (150 mg) two tablets twice daily for 5 days and ritonavir (100 mg) one tablet twice daily for 5 days. -     dexamethasone (DECADRON) 1 MG tablet; Take 3 tabs for 3 days, 2 tabs for 3 days 1 tab for 5 days. Take in the morning with food. -     promethazine-dextromethorphan (PROMETHAZINE-DM) 6.25-15 MG/5ML syrup; Take 5 mLs by mouth 4 (four) times daily as needed for cough.   Follow Up Instructions:  I discussed the assessment and treatment plan with the patient. The patient was provided an opportunity to ask questions and all were answered. The patient agreed with the plan and demonstrated an understanding of the instructions.   The patient was advised to call back or seek an in-person evaluation if the symptoms worsen or if  the condition fails to improve as anticipated.  I provided 20 minutes of non-face-to-face time during this encounter.   Izora Ribas, NP

## 2021-12-27 DIAGNOSIS — K118 Other diseases of salivary glands: Secondary | ICD-10-CM | POA: Diagnosis not present

## 2021-12-27 NOTE — Progress Notes (Deleted)
Assessment and Plan:  There are no diagnoses linked to this encounter.    Further disposition pending results of labs. Discussed med's effects and SE's.   Over 30 minutes of exam, counseling, chart review, and critical decision making was performed.   Future Appointments  Date Time Provider [place]  12/28/2021  4:00 PM Magda Bernheim, NP [facility] None  02/07/2022  2:00 PM Magda Bernheim, NP [facility] None    ------------------------------------------------------------------------------------------------------------------   HPI There were no vitals taken for this visit. 88 y.o.female presents for  Past Medical History:  Diagnosis Date   COPD (chronic obstructive pulmonary disease) with emphysema ([place])    COVID-19 12/09/2019   Hyperlipidemia    Hypertension    Lung cancer, upper lobe ([place]) 12/04/1996   RUL   Osteoporosis    S/P partial lobectomy of lung    [id number]   Thigh shingles 12/04/1996   Thoracic aorta atherosclerosis ([facility])    Vitamin D deficiency      Allergies  Allergen Reactions   Ace Inhibitors Cough   Calcium-Containing Compounds Other (See Comments)    constpation   Lactose Intolerance (Gi) Diarrhea   Polysporin [Bacitracin-Polymyxin B] Rash    Polysporin or neosporin gave her a rash, couldn't remember which    Current Outpatient Medications on File Prior to Visit  Medication Sig   aspirin 81 MG chewable tablet Chew 81 mg by mouth daily.   cetirizine (ZYRTEC) 10 MG tablet Takes 1 tablet Daily   Cholecalciferol 125 MCG (5000 UT) capsule Take 1 capsules Daily   CINNAMON PO Take 1,000 mg by mouth daily.   Cyanocobalamin (VITAMIN B-12) 1000 MCG SUBL Takes 1 tab SL Daily   dexamethasone (DECADRON) 1 MG tablet Take 3 tabs for 3 days, 2 tabs for 3 days 1 tab for 5 days. Take in the morning with food.   diphenhydramine-acetaminophen (TYLENOL PM) 25-500 MG TABS Take 1 tablet by mouth at bedtime. Take every night per patient (Patient not taking: Reported on  09/19/2021)   hydrochlorothiazide (HYDRODIURIL) 25 MG tablet Take  1 tablet  Daily  for BP & Fluid Retention / Ankle Swelling - Patient knows to take by mouth   metoprolol tartrate (LOPRESSOR) 50 MG tablet Take  1 tablet  2 x /day (every 12 hours)  for BP /Patient knows to take by mouth   montelukast (SINGULAIR) 10 MG tablet Take  1 tablet  Daily  for Allergies   promethazine-dextromethorphan (PROMETHAZINE-DM) 6.25-15 MG/5ML syrup Take 5 mLs by mouth 4 (four) times daily as needed for cough.   simvastatin (ZOCOR) 40 MG tablet TAKE ONE TABLET BY MOUTH EVERY NIGHT AT BEDTIME FOR CHOLESTEROL   No current facility-administered medications on file prior to visit.    ROS: all negative except above.   Physical Exam:  There were no vitals taken for this visit.  General Appearance: Well nourished, in no apparent distress. Eyes: PERRLA, EOMs, conjunctiva no swelling or erythema Sinuses: No Frontal/maxillary tenderness ENT/Mouth: Ext aud canals clear, TMs without erythema, bulging. No erythema, swelling, or exudate on post pharynx.  Tonsils not swollen or erythematous. Hearing normal.  Neck: Supple, thyroid normal.  Respiratory: Respiratory effort normal, BS equal bilaterally without rales, rhonchi, wheezing or stridor.  Cardio: RRR with no MRGs. Brisk peripheral pulses without edema.  Abdomen: Soft, + BS.  Non tender, no guarding, rebound, hernias, masses. Lymphatics: Non tender without lymphadenopathy.  Musculoskeletal: Full ROM, 5/5 strength, normal gait.  Skin: Warm, dry without rashes, lesions, ecchymosis.  Neuro:  Cranial nerves intact. Normal muscle tone, no cerebellar symptoms. Sensation intact.  Psych: Awake and oriented X 3, normal affect, Insight and Judgment appropriate.     Magda Bernheim, NP 5:14 PM [facility]

## 2021-12-28 ENCOUNTER — Ambulatory Visit (INDEPENDENT_AMBULATORY_CARE_PROVIDER_SITE_OTHER): Payer: Medicare Other | Admitting: Nurse Practitioner

## 2021-12-28 ENCOUNTER — Encounter: Payer: Self-pay | Admitting: Nurse Practitioner

## 2021-12-28 ENCOUNTER — Other Ambulatory Visit: Payer: Self-pay

## 2021-12-28 ENCOUNTER — Ambulatory Visit: Payer: Medicare Other | Admitting: Nurse Practitioner

## 2021-12-28 VITALS — BP 124/78 | HR 89 | Temp 97.7°F | Wt 142.8 lb

## 2021-12-28 DIAGNOSIS — K1120 Sialoadenitis, unspecified: Secondary | ICD-10-CM

## 2021-12-28 LAB — COMPLETE METABOLIC PANEL WITH GFR
AG Ratio: 1.5 (calc) (ref 1.0–2.5)
ALT: 17 U/L (ref 6–29)
AST: 19 U/L (ref 10–35)
Albumin: 4.2 g/dL (ref 3.6–5.1)
Alkaline phosphatase (APISO): 59 U/L (ref 37–153)
BUN: 16 mg/dL (ref 7–25)
CO2: 35 mmol/L — ABNORMAL HIGH (ref 20–32)
Calcium: 9.6 mg/dL (ref 8.6–10.4)
Chloride: 101 mmol/L (ref 98–110)
Creat: 0.81 mg/dL (ref 0.60–0.95)
Globulin: 2.8 g/dL (calc) (ref 1.9–3.7)
Glucose, Bld: 112 mg/dL — ABNORMAL HIGH (ref 65–99)
Potassium: 3.7 mmol/L (ref 3.5–5.3)
Sodium: 142 mmol/L (ref 135–146)
Total Bilirubin: 0.7 mg/dL (ref 0.2–1.2)
Total Protein: 7 g/dL (ref 6.1–8.1)
eGFR: 71 mL/min/{1.73_m2} (ref >=60)

## 2021-12-28 LAB — CBC WITH DIFFERENTIAL/PLATELET
Absolute Monocytes: 611 cells/uL (ref 200–950)
Basophils Absolute: 21 cells/uL (ref 0–200)
Basophils Relative: 0.3 %
Eosinophils Absolute: 28 cells/uL (ref 15–500)
Eosinophils Relative: 0.4 %
HCT: 39.9 % (ref 35.0–45.0)
Hemoglobin: 12.9 g/dL (ref 11.7–15.5)
Lymphs Abs: 980 cells/uL (ref 850–3900)
MCH: 28.3 pg (ref 27.0–33.0)
MCHC: 32.3 g/dL (ref 32.0–36.0)
MCV: 87.5 fL (ref 80.0–100.0)
MPV: 11 fL (ref 7.5–12.5)
Monocytes Relative: 8.6 %
Neutro Abs: 5460 cells/uL (ref 1500–7800)
Neutrophils Relative %: 76.9 %
Platelets: 166 10*3/uL (ref 140–400)
RBC: 4.56 10*6/uL (ref 3.80–5.10)
RDW: 13.1 % (ref 11.0–15.0)
Total Lymphocyte: 13.8 %
WBC: 7.1 10*3/uL (ref 3.8–10.8)

## 2021-12-28 MED ORDER — AMOXICILLIN-POT CLAVULANATE 875-125 MG PO TABS: 1.0000 | ORAL_TABLET | Freq: Two times a day (BID) | ORAL | 0 refills | Status: DC

## 2021-12-28 NOTE — Patient Instructions (Signed)
Parotitis Parotitis is inflammation of one or both of the parotid glands. These glands produce saliva. They are found on each side of the face, below and in front of the earlobes. The saliva that they produce comes out of tiny openings (ducts) inside the cheeks. Parotitis may cause sudden swelling and pain (acute parotitis). It can also cause repeated episodes of swelling and pain or continued swelling that may or may not be painful (chronic parotitis). What are the causes? This condition may be caused by: Infections from bacteria. Infections from viruses, such as mumps or HIV. Blockage (obstruction) of saliva flow through the parotid glands. This can be from a stone, scar tissue, or a tumor. Diseases that cause your body's defense system (immune system) to attack healthy cells in your salivary glands. These are called autoimmune diseases. What increases the risk? You are more likely to develop this condition if: You are 80 years old or older. You do not drink enough fluids (are dehydrated). You drink too much alcohol. You have: A dry mouth. Diabetes. Gout. A long-term illness. You do not take good care of your mouth and teeth (poor oral hygiene). You have had radiation treatments to the head and neck. You take certain medicines. What are the signs or symptoms? Symptoms of this condition depend on the cause. Symptoms may include: Swelling under and in front of the ear. This may get worse after eating. Pain and tenderness over the parotid gland. This may get worse after eating. Redness and warmth of the skin over the parotid gland. Fever or chills. Pus coming from the ducts inside the mouth. Dry mouth. A bad taste in the mouth. How is this diagnosed? This condition may be diagnosed based on: Your medical history. A physical exam. Tests to find the cause of the parotitis. These may include: Doing blood tests to check for an autoimmune disease or infections from a virus. Taking a  fluid sample from the parotid gland and testing it for infection. Injecting the ducts of the parotid gland with a dye and then taking X-rays (sialogram). Having other imaging tests of the gland, such as X-rays, ultrasound, MRI, or CT scan. Checking the opening of the gland for a stone or obstruction. Placing a needle into the gland to remove tissue for a biopsy (fine needle aspiration). How is this treated? Treatment for this condition depends on the cause. Treatment may include: Antibiotic medicine for a bacterial infection. NSAIDs, such as ibuprofen, to treat pain and swelling. Drinking more fluids. Removing a stone or obstruction. Treating an underlying disease that is causing parotitis. Surgery to drain an infection, remove a growth, or remove the whole gland (parotidectomy). Treatment may not be needed if parotid swelling goes away with home care. Follow these instructions at home: Medicines  Take over-the-counter and prescription medicines only as told by your health care provider. If you were prescribed an antibiotic medicine, take it as told by your health care provider. Do not stop taking the antibiotic even if you start to feel better. Managing pain and swelling If directed, apply heat to the affected area as often as told by your health care provider. Use the heat source that your health care provider recommends, such as a moist heat pack or a heating pad. Place a towel between your skin and the heat source. Leave the heat on for 20-30 minutes. Remove the heat if your skin turns bright red. This is especially important if you are unable to feel pain, heat, or cold. You  have a greater risk of getting burned. Gargle with a mixture of salt and water 3-4 times a day or as needed. To make salt water, completely dissolve -1 tsp (3-6 g) of salt in 1 cup (237 mL) of warm water. Gently massage the parotid glands as told by your health care provider. General instructions  Drink enough  fluid to keep your urine pale yellow. Keep your mouth clean and moist. Try sucking on sour candy. This may help to make your mouth less dry by stimulating the flow of saliva. Maintain good oral health. Brush your teeth at least two times a day. Floss your teeth every day. See your dentist regularly. Do not use any products that contain nicotine or tobacco. These products include cigarettes, chewing tobacco, and vaping devices, such as e-cigarettes. If you need help quitting, ask your health care provider. Do not drink alcohol. Keep all follow-up visits. This is important. Contact a health care provider if: You have a fever or chills. You have new symptoms. Your symptoms get worse. Your symptoms do not improve with treatment. Get help right away if: You have difficulty breathing or swallowing because of the swollen gland. These symptoms may represent a serious problem that is an emergency. Do not wait to see if the symptoms will go away. Get medical help right away. Call your local emergency services (911 in the [place] Do not drive yourself to the hospital. Summary Parotitis is inflammation of one or both of the parotid glands. Symptoms include pain and swelling under and in front of the ear. They may also include a fever and a bad taste in your mouth. This condition may be treated with antibiotics, NSAIDs, increasing fluids, or surgery. In some cases, parotitis may go away on its own without treatment. You should drink plenty of fluids, maintain good oral health, and do not use products that contain nicotine or tobacco. This information is not intended to replace advice given to you by your health care provider. Make sure you discuss any questions you have with your health care provider. Document Revised: 04/01/2021 Document Reviewed: 04/01/2021 Elsevier Patient Education  [place].

## 2021-12-28 NOTE — Progress Notes (Signed)
Assessment and Plan:  Penny [name] was seen today for acute visit.  Diagnoses and all orders for this visit:  Parotitis not due to mumps -     amoxicillin-clavulanate (AUGMENTIN) 875-125 MG tablet; Take 1 tablet by mouth 2 (two) times daily for 7 days. Push fluids, suck on sour candy If becomes unable to swallow, difficulty breathing or runs a high fever she is to go to the ER Follow up here in 1 week unless she does go to ENT then can follow up with them -     Ambulatory referral to ENT -     CBC with Differential/Platelet -     COMPLETE METABOLIC PANEL WITH GFR       Further disposition pending results of labs. Discussed med's effects and SE's.   Over 30 minutes of exam, counseling, chart review, and critical decision making was performed.   Future Appointments  Date Time Provider [place]  02/07/2022  2:00 PM Magda Bernheim, NP [facility] None    ------------------------------------------------------------------------------------------------------------------   HPI BP 124/78    Pulse 89    Temp 97.7 F (36.5 C)    Wt 142 lb 12.8 oz (64.8 kg)    SpO2 97%    BMI 24.51 kg/m  88 y.o.female presents for pain in jaw and ear associated with swelling that began yesterday. Went to Urgent Care last night and they did not see any signs of infection. They had her start sucking Altoids to try to open salivary gland in case a block salivary gland is the cause. Has not been able to eat because she is having some difficulty swallowing.    Past Medical History:  Diagnosis Date   COPD (chronic obstructive pulmonary disease) with emphysema ([place])    COVID-19 12/09/2019   Hyperlipidemia    Hypertension    Lung cancer, upper lobe ([place]) 12/04/1996   RUL   Osteoporosis    S/P partial lobectomy of lung    [id number]   Thigh shingles 12/04/1996   Thoracic aorta atherosclerosis ([facility])    Vitamin D deficiency      Allergies  Allergen Reactions   Ace Inhibitors Cough    Calcium-Containing Compounds Other (See Comments)    constpation   Lactose Intolerance (Gi) Diarrhea   Polysporin [Bacitracin-Polymyxin B] Rash    Polysporin or neosporin gave her a rash, couldn't remember which    Current Outpatient Medications on File Prior to Visit  Medication Sig   acetaminophen (TYLENOL) 500 MG tablet Take 500 mg by mouth every 6 (six) hours as needed.   aspirin 81 MG chewable tablet Chew 81 mg by mouth daily.   cetirizine (ZYRTEC) 10 MG tablet Takes 1 tablet Daily   Cholecalciferol 125 MCG (5000 UT) capsule Take 1 capsules Daily   CINNAMON PO Take 1,000 mg by mouth daily.   Cyanocobalamin (VITAMIN B-12) 1000 MCG SUBL Takes 1 tab SL Daily   hydrochlorothiazide (HYDRODIURIL) 25 MG tablet Take  1 tablet  Daily  for BP & Fluid Retention / Ankle Swelling - Patient knows to take by mouth   metoprolol tartrate (LOPRESSOR) 50 MG tablet Take  1 tablet  2 x /day (every 12 hours)  for BP /Patient knows to take by mouth   montelukast (SINGULAIR) 10 MG tablet Take  1 tablet  Daily  for Allergies   simvastatin (ZOCOR) 40 MG tablet TAKE ONE TABLET BY MOUTH EVERY NIGHT AT BEDTIME FOR CHOLESTEROL   dexamethasone (DECADRON) 1 MG tablet Take 3 tabs for 3 days,  2 tabs for 3 days 1 tab for 5 days. Take in the morning with food. (Patient not taking: Reported on 12/28/2021)   diphenhydramine-acetaminophen (TYLENOL PM) 25-500 MG TABS Take 1 tablet by mouth at bedtime. Take every night per patient (Patient not taking: Reported on 09/19/2021)   promethazine-dextromethorphan (PROMETHAZINE-DM) 6.25-15 MG/5ML syrup Take 5 mLs by mouth 4 (four) times daily as needed for cough. (Patient not taking: Reported on 12/28/2021)   No current facility-administered medications on file prior to visit.    ROS: all negative except above.   Physical Exam:  BP 124/78    Pulse 89    Temp 97.7 F (36.5 C)    Wt 142 lb 12.8 oz (64.8 kg)    SpO2 97%    BMI 24.51 kg/m   General Appearance: Well  nourished, in no apparent distress. Eyes: PERRLA, EOMs, conjunctiva no swelling or erythema Sinuses: No Frontal/maxillary tenderness ENT/Mouth: Ext aud canals clear, TMs without erythema, bulging Marked edema from left ear down side of face and under neck- firm in parotid gland area Respiratory: Respiratory effort normal, BS equal bilaterally without rales, rhonchi, wheezing or stridor.  Cardio: RRR with no MRGs. Brisk peripheral pulses without edema.  Abdomen: Soft, + BS.  Non tender, no guarding, rebound, hernias, masses. Lymphatics: Marked swelling left neck, unable to feel lymph nodes.  Psych: Awake and oriented X 3, normal affect, Insight and Judgment appropriate.     Magda Bernheim, NP 3:16 PM [facility]

## 2021-12-29 DIAGNOSIS — K1121 Acute sialoadenitis: Secondary | ICD-10-CM | POA: Diagnosis not present

## 2022-01-02 NOTE — Progress Notes (Signed)
Assessment and Plan:  Penny [name] was seen today for acute visit.  Diagnoses and all orders for this visit:  Parotitis not due to mumps Augmentin dosage completed, will do three more days of Augmentin and continue sucking on sour candy, massaging the area and warm compresses If area begins to swell again, runs a fever she is to call ENT for follow up        Further disposition pending results of labs. Discussed med's effects and SE's.   Over 20 minutes of exam, counseling, chart review, and critical decision making was performed.   Future Appointments  Date Time Provider [place]  01/04/2022  9:30 AM Magda Bernheim, NP [facility] None  02/07/2022  2:00 PM Magda Bernheim, NP [facility] None    ------------------------------------------------------------------------------------------------------------------   HPI BP 132/82    Pulse 70    Temp 97.7 F (36.5 C)    Wt 143 lb 12.8 oz (65.2 kg)    SpO2 95%    BMI 24.68 kg/m  88 y.o.female presents for reevaluation of parotitis which was diagnosed 1 week ago. Was evaluated by ENT who advised to continue current dose of Augmentin , suck sour candy and massage the area. Improvement has been noted.    Past Medical History:  Diagnosis Date   COPD (chronic obstructive pulmonary disease) with emphysema ([place])    COVID-19 12/09/2019   Hyperlipidemia    Hypertension    Lung cancer, upper lobe ([place]) 12/04/1996   RUL   Osteoporosis    S/P partial lobectomy of lung    [id number]   Thigh shingles 12/04/1996   Thoracic aorta atherosclerosis ([facility])    Vitamin D deficiency      Allergies  Allergen Reactions   Ace Inhibitors Cough   Calcium-Containing Compounds Other (See Comments)    constpation   Lactose Intolerance (Gi) Diarrhea   Polysporin [Bacitracin-Polymyxin B] Rash    Polysporin or neosporin gave her a rash, couldn't remember which    Current Outpatient Medications on File Prior to Visit  Medication Sig   aspirin 81 MG chewable  tablet Chew 81 mg by mouth daily.   cetirizine (ZYRTEC) 10 MG tablet Takes 1 tablet Daily   Cholecalciferol 125 MCG (5000 UT) capsule Take 1 capsules Daily   CINNAMON PO Take 1,000 mg by mouth daily.   Cyanocobalamin (VITAMIN B-12) 1000 MCG SUBL Takes 1 tab SL Daily   hydrochlorothiazide (HYDRODIURIL) 25 MG tablet Take  1 tablet  Daily  for BP & Fluid Retention / Ankle Swelling - Patient knows to take by mouth   metoprolol tartrate (LOPRESSOR) 50 MG tablet Take  1 tablet  2 x /day (every 12 hours)  for BP /Patient knows to take by mouth   montelukast (SINGULAIR) 10 MG tablet Take  1 tablet  Daily  for Allergies   simvastatin (ZOCOR) 40 MG tablet TAKE ONE TABLET BY MOUTH EVERY NIGHT AT BEDTIME FOR CHOLESTEROL   acetaminophen (TYLENOL) 500 MG tablet Take 500 mg by mouth every 6 (six) hours as needed. (Patient not taking: Reported on 01/04/2022)   amoxicillin-clavulanate (AUGMENTIN) 875-125 MG tablet Take 1 tablet by mouth 2 (two) times daily for 7 days. (Patient not taking: Reported on 01/04/2022)   No current facility-administered medications on file prior to visit.    ROS: all negative except above.   Physical Exam:  BP 132/82    Pulse 70    Temp 97.7 F (36.5 C)    Wt 143 lb 12.8 oz (65.2 kg)  SpO2 95%    BMI 24.68 kg/m   General Appearance: Well nourished, in no apparent distress. Eyes: PERRLA, EOMs, conjunctiva no swelling or erythema Sinuses: No Frontal/maxillary tenderness ENT/Mouth: Ext aud canals clear, TMs without erythema, edema of left parotid gland is markedly decreased, minimal edema left submandibular area, tenderness to touch remains but not as severe Respiratory: Respiratory effort normal, BS equal bilaterally without rales, rhonchi, wheezing or stridor.  Cardio: RRR with no MRGs. Brisk peripheral pulses without edema.  Abdomen: Soft, + BS.  Non tender, no guarding, rebound, hernias, masses. Lymphatics: Marked swelling left neck, unable to feel lymph nodes.  Psych: Awake  and oriented X 3, normal affect, Insight and Judgment appropriate.     Magda Bernheim, NP 9:27 AM [facility]

## 2022-01-03 DIAGNOSIS — K1121 Acute sialoadenitis: Secondary | ICD-10-CM | POA: Diagnosis not present

## 2022-01-04 ENCOUNTER — Other Ambulatory Visit: Payer: Self-pay

## 2022-01-04 ENCOUNTER — Encounter: Payer: Self-pay | Admitting: Nurse Practitioner

## 2022-01-04 ENCOUNTER — Ambulatory Visit (INDEPENDENT_AMBULATORY_CARE_PROVIDER_SITE_OTHER): Payer: Medicare Other | Admitting: Nurse Practitioner

## 2022-01-04 VITALS — BP 132/82 | HR 70 | Temp 97.7°F | Wt 143.8 lb

## 2022-01-04 DIAGNOSIS — K1120 Sialoadenitis, unspecified: Secondary | ICD-10-CM

## 2022-01-04 MED ORDER — AMOXICILLIN-POT CLAVULANATE 875-125 MG PO TABS: 1.0000 | ORAL_TABLET | Freq: Two times a day (BID) | ORAL | 0 refills | Status: AC

## 2022-01-24 ENCOUNTER — Other Ambulatory Visit: Payer: Self-pay

## 2022-01-24 DIAGNOSIS — E782 Mixed hyperlipidemia: Secondary | ICD-10-CM

## 2022-01-24 MED ORDER — SIMVASTATIN 40 MG PO TABS: ORAL_TABLET | ORAL | 0 refills | Status: DC

## 2022-02-06 NOTE — Progress Notes (Signed)
COMPLETE PHYSICAL  Assessment:   Encounter for general adult medical examination with abnormal findings Due yearly  Essential hypertension - continue medications:HCTZ 25mg , metoprolol 50mg  BID - DASH diet, exercise and monitor at home. Call if greater than 130/80.  - CBC with Differential/Platelet - TSH   Hyperlipidemia -continue simvastatin 40mg and could not tolerate crestor  -check lipids, decrease fatty foods, increase activity.  - Lipid panel  Abnormal glucose Discussed general issues about diabetes pathophysiology and management., Educational material distributed., Suggested low cholesterol diet., Encouraged aerobic exercise., Discussed foot care., Reminded to get yearly retinal exam. - Hemoglobin A1c   Vitamin D deficiency - Vit D  25 hydroxy (rtn osteoporosis monitoring)   Medication management - Magnesium  Atherosclerosis of aorta Control blood pressure, cholesterol, glucose, increase exercise.   Hx of cancer of lung CXR 02/07/21, continue follow up- CXR ordered  Osteopenia after menopause Continue vitamin D Did not tolerate fosamax  Environmental allergies Allergic rhinitis- Allegra OTC, increase fluids, allergy hygiene explained.  Iron deficiency Check Iron total/TIBC, Ferritin  B12 deficiency - Check B-12   Medication management -     Magnesium     Further disposition pending results if labs check today. Discussed med's effects and SE's.   Over 30 minutes of face to face interview, exam, counseling, chart review, and critical decision making was performed.    Future Appointments  Date Time Provider [place]  02/07/2022  2:00 PM Magda Bernheim, NP [facility] None  02/08/2023  2:00 PM Magda Bernheim, NP [facility] None     Subjective:   Penny [name] is a 88 y.o. female who presents for complete physical on hypertension, prediabetes, hyperlipidemia, vitamin D def.    She continues to work at [employer] in [employer] in [employer],  6 hours a day.  BMI is Body mass index is 24.65 kg/m., she has been working on diet and exercise.  Wt Readings from Last 3 Encounters:  02/07/22 143 lb 9.6 oz (65.1 kg)  01/04/22 143 lb 12.8 oz (65.2 kg)  12/28/21 142 lb 12.8 oz (64.8 kg)   Her blood pressure has been controlled at home, today their BP is BP: 122/74  BP Readings from Last 3 Encounters:  02/07/22 122/74  01/04/22 132/82  12/28/21 124/78    She does not workout but is very active and does try to walk.  She denies chest pain, shortness of breath, dizziness.   She is on cholesterol medication, simvastatin 40 and denies myalgias. Her cholesterol is at goal. The cholesterol last visit was:   Lab Results  Component Value Date   CHOL 168 09/19/2021   HDL 62 09/19/2021   LDLCALC 87 09/19/2021   TRIG 95 09/19/2021   CHOLHDL 2.7 09/19/2021   She has been working on diet and exercise for prediabetes, and denies paresthesia of the feet, polydipsia and polyuria. Last A1C in the office was:  Lab Results  Component Value Date   HGBA1C 5.7 (H) 06/15/2021   Patient is on Vitamin D supplement. Discussed decreasing dose, 5,000IU Lab Results  Component Value Date   VD25OH 134 (H) 09/19/2021     She believes her memory has improved on focus supplement. No numbness/tingling in hands or feet. B12 elevated, discussed reduced dose for this.  Lab Results  Component Value Date   VITAMINB12 >2,000 (H) 06/28/2020    Medication Review Current Outpatient Medications on File Prior to Visit  Medication Sig Dispense Refill   acetaminophen (TYLENOL) 500 MG  tablet Take 500 mg by mouth every 6 (six) hours as needed. (Patient not taking: Reported on 01/04/2022)     aspirin 81 MG chewable tablet Chew 81 mg by mouth daily.     cetirizine (ZYRTEC) 10 MG tablet Takes 1 tablet Daily     Cholecalciferol 125 MCG (5000 UT) capsule Take 1 capsules Daily 90 capsule 99   CINNAMON PO Take 1,000 mg by mouth daily.     Cyanocobalamin  (VITAMIN B-12) 1000 MCG SUBL Takes 1 tab SL Daily  0   hydrochlorothiazide (HYDRODIURIL) 25 MG tablet Take  1 tablet  Daily  for BP & Fluid Retention / Ankle Swelling - Patient knows to take by mouth 90 tablet 3   metoprolol tartrate (LOPRESSOR) 50 MG tablet Take  1 tablet  2 x /day (every 12 hours)  for BP /Patient knows to take by mouth 180 tablet 3   montelukast (SINGULAIR) 10 MG tablet Take  1 tablet  Daily  for Allergies 90 tablet 3   simvastatin (ZOCOR) 40 MG tablet TAKE ONE TABLET BY MOUTH EVERY NIGHT AT BEDTIME FOR CHOLESTEROL 90 tablet 0   No current facility-administered medications on file prior to visit.    Current Problems (verified) Patient Active Problem List   Diagnosis Date Noted   COVID-19 (11/30/2021) 12/01/2021   B12 deficiency 06/28/2020   Abnormal glucose 10/09/2018   Osteoporosis 12/28/2015   COPD (chronic obstructive pulmonary disease) with emphysema ([place]) 02/25/2015   Atherosclerosis of aorta ([place]) BY ct SCAN IN 2015 02/25/2015   Essential hypertension    Hyperlipidemia, mixed    Vitamin D deficiency    Personal history of lung cancer 12/04/1996    Immunization History  Administered Date(s) Administered   Influenza, High Dose Seasonal PF 09/15/2015, 10/02/2017, 07/27/2019, 10/07/2020, 09/19/2021   Influenza-Unspecified 08/04/2012, 09/03/2013, 09/03/2018   PFIZER(Purple Top)SARS-COV-2 Vaccination 01/26/2020, 02/16/2020   Pneumococcal Conjugate-13 07/21/2014   Pneumococcal Polysaccharide-23 10/30/2005   Td 04/03/2013   Preventative care: Last colonoscopy: 2012 done  Last mammogram: 07/25/21 Last pap smear/pelvic exam: 2012 negative DEXA: 07/25/21 has been on alendronate since 2016- stopped 2019- -2.2 osteopenia Echo 2009 normal EF CXR 2021 CT cervical spin 10/2014 CT head 10/20/2014  Names of Other Physician/Practitioners you currently use: 1. [facility]- here for primary care 2. Dr. Prudencio Burly, eye doctor, last  visit 2020 3. , dentist, last visit 2020 4. Dr. Allyson Sabal, derm, 2019, goes annually  Patient Care Team: Unk Pinto, MD as PCP - General (Internal Medicine) Druscilla Brownie, MD as Consulting Physician (Dermatology) Daryll Brod, MD as Consulting Physician (Orthopedic Surgery) Richmond Campbell, MD as Consulting Physician (Gastroenterology) Nicanor Alcon, MD as Attending Physician (Thoracic Surgery)  Allergies Allergies  Allergen Reactions   Ace Inhibitors Cough   Calcium-Containing Compounds Other (See Comments)    constpation   Lactose Intolerance (Gi) Diarrhea   Polysporin [Bacitracin-Polymyxin B] Rash    Polysporin or neosporin gave her a rash, couldn't remember which    SURGICAL HISTORY She  has a past surgical history that includes Carpal tunnel release (Right); Shoulder adhesion release (Right); Lung removal, partial (Right); and Olecranon bursectomy (Left, 03/20/2017). FAMILY HISTORY Her family history includes Breast cancer in her paternal aunt; Heart disease in her mother; Lung cancer in her father. SOCIAL HISTORY She  reports that she quit smoking about 25 years ago. Her smoking use included cigarettes. She has never used smokeless tobacco. She reports current alcohol use of about 1.0 standard drink per week. She reports  that she does not use drugs.  Review of Systems  Constitutional:  Negative for chills and fever.  HENT:  Negative for congestion, hearing loss, sinus pain, sore throat and tinnitus.   Eyes:  Negative for blurred vision and double vision.  Respiratory:  Negative for cough, hemoptysis, sputum production, shortness of breath and wheezing.   Cardiovascular:  Negative for chest pain, palpitations and leg swelling.  Gastrointestinal:  Negative for abdominal pain, constipation, diarrhea, heartburn, nausea and vomiting.  Genitourinary:  Negative for dysuria and urgency.  Musculoskeletal:  Positive for back pain (lower back). Negative for falls, joint pain,  myalgias and neck pain.  Skin:  Negative for rash.  Neurological:  Negative for dizziness, tingling, tremors, weakness and headaches.  Endo/Heme/Allergies:  Does not bruise/bleed easily.  Psychiatric/Behavioral:  Negative for depression and suicidal ideas. The patient is not nervous/anxious and does not have insomnia.     Objective:   Blood pressure 122/74, pulse 75, temperature (!) 97.5 F (36.4 C), weight 143 lb 9.6 oz (65.1 kg), SpO2 94 %. Body mass index is 24.65 kg/m.  General appearance: alert, no distress, WD/WN,  female HEENT: normocephalic, sclerae anicteric, TMs pearly, nares patent, no discharge or erythema, pharynx normal Oral cavity: MMM, no lesions Neck: supple, no lymphadenopathy, no thyromegaly, no masses Skin: warm, dry, no masses, rashes or lesions noted Heart: RRR, normal S1, S2, no murmurs Lungs: CTA bilaterally, no wheezes, rhonchi, or rales Abdomen: +bs, soft, non tender, non distended, no masses, no hepatomegaly, no splenomegaly Musculoskeletal:  no swelling, no obvious deformity.  No erythema or ecchymosis noted. Extremities: no edema, no cyanosis, no clubbing Pulses: 2+ symmetric, upper and lower extremities, normal cap refill Neurological: alert, oriented x 3, CN2-12 intact, strength normal upper extremities and lower extremities, sensation normal throughout, DTRs 2+ throughout, no cerebellar signs, gait normal Psychiatric: normal affect, behavior normal, pleasant  Breast: defer Gyn: defer Rectal: defer EKG: NSR, no ST changes  [facility]  02/07/2022

## 2022-02-07 ENCOUNTER — Encounter: Payer: Self-pay | Admitting: Nurse Practitioner

## 2022-02-07 ENCOUNTER — Ambulatory Visit
Admission: RE | Admit: 2022-02-07 | Discharge: 2022-02-07 | Disposition: A | Discharge status: Home / Self Care | Payer: Medicare Other | Source: Ambulatory Visit | Attending: Nurse Practitioner | Admitting: Nurse Practitioner

## 2022-02-07 ENCOUNTER — Other Ambulatory Visit: Payer: Self-pay

## 2022-02-07 ENCOUNTER — Ambulatory Visit (INDEPENDENT_AMBULATORY_CARE_PROVIDER_SITE_OTHER): Payer: Medicare Other | Admitting: Nurse Practitioner

## 2022-02-07 VITALS — BP 122/74 | HR 75 | Temp 97.5°F | Ht 62.0 in | Wt 143.6 lb

## 2022-02-07 DIAGNOSIS — R7309 Other abnormal glucose: Secondary | ICD-10-CM | POA: Diagnosis not present

## 2022-02-07 DIAGNOSIS — Z780 Asymptomatic menopausal state: Secondary | ICD-10-CM

## 2022-02-07 DIAGNOSIS — Z85118 Personal history of other malignant neoplasm of bronchus and lung: Secondary | ICD-10-CM

## 2022-02-07 DIAGNOSIS — E538 Deficiency of other specified B group vitamins: Secondary | ICD-10-CM

## 2022-02-07 DIAGNOSIS — E782 Mixed hyperlipidemia: Secondary | ICD-10-CM

## 2022-02-07 DIAGNOSIS — E559 Vitamin D deficiency, unspecified: Secondary | ICD-10-CM | POA: Diagnosis not present

## 2022-02-07 DIAGNOSIS — Z0000 Encounter for general adult medical examination without abnormal findings: Secondary | ICD-10-CM | POA: Diagnosis not present

## 2022-02-07 DIAGNOSIS — M810 Age-related osteoporosis without current pathological fracture: Secondary | ICD-10-CM

## 2022-02-07 DIAGNOSIS — J439 Emphysema, unspecified: Secondary | ICD-10-CM

## 2022-02-07 DIAGNOSIS — Z79899 Other long term (current) drug therapy: Secondary | ICD-10-CM | POA: Diagnosis not present

## 2022-02-07 DIAGNOSIS — Z0001 Encounter for general adult medical examination with abnormal findings: Secondary | ICD-10-CM

## 2022-02-07 DIAGNOSIS — Z1389 Encounter for screening for other disorder: Secondary | ICD-10-CM

## 2022-02-07 DIAGNOSIS — E611 Iron deficiency: Secondary | ICD-10-CM

## 2022-02-07 DIAGNOSIS — I10 Essential (primary) hypertension: Secondary | ICD-10-CM

## 2022-02-07 DIAGNOSIS — Z136 Encounter for screening for cardiovascular disorders: Secondary | ICD-10-CM | POA: Diagnosis not present

## 2022-02-07 DIAGNOSIS — R0602 Shortness of breath: Secondary | ICD-10-CM | POA: Diagnosis not present

## 2022-02-07 DIAGNOSIS — I700 Atherosclerosis of aorta: Secondary | ICD-10-CM

## 2022-02-07 DIAGNOSIS — M8580 Other specified disorders of bone density and structure, unspecified site: Secondary | ICD-10-CM

## 2022-02-07 NOTE — Patient Instructions (Addendum)
Chest Xray ordered at [place] [facility] ? ? ? ?GENERAL HEALTH GOALS ?  ?Know what a healthy weight is for you (roughly BMI <25) and aim to maintain this ?  ?Aim for 7+ servings of fruits and vegetables daily ?  ?70-80+ fluid ounces of water or unsweet tea for healthy kidneys ?  ?Limit to max 1 drink of alcohol per day; avoid smoking/tobacco ?  ?Limit animal fats in diet for cholesterol and heart health - choose grass fed whenever available ?  ?Avoid highly processed foods, and foods high in saturated/trans fats ?  ?Aim for low stress - take time to unwind and care for your mental health ?  ?Aim for 150 min of moderate intensity exercise weekly for heart health, and weights twice weekly for bone health ?  ?Aim for 7-9 hours of sleep daily  ?

## 2022-02-08 DIAGNOSIS — L814 Other melanin hyperpigmentation: Secondary | ICD-10-CM | POA: Diagnosis not present

## 2022-02-08 DIAGNOSIS — L720 Epidermal cyst: Secondary | ICD-10-CM | POA: Diagnosis not present

## 2022-02-08 DIAGNOSIS — L298 Other pruritus: Secondary | ICD-10-CM | POA: Diagnosis not present

## 2022-02-08 DIAGNOSIS — Z08 Encounter for follow-up examination after completed treatment for malignant neoplasm: Secondary | ICD-10-CM | POA: Diagnosis not present

## 2022-02-08 DIAGNOSIS — L821 Other seborrheic keratosis: Secondary | ICD-10-CM | POA: Diagnosis not present

## 2022-02-08 DIAGNOSIS — L820 Inflamed seborrheic keratosis: Secondary | ICD-10-CM | POA: Diagnosis not present

## 2022-02-08 DIAGNOSIS — D225 Melanocytic nevi of trunk: Secondary | ICD-10-CM | POA: Diagnosis not present

## 2022-02-08 DIAGNOSIS — R208 Other disturbances of skin sensation: Secondary | ICD-10-CM | POA: Diagnosis not present

## 2022-02-08 DIAGNOSIS — L853 Xerosis cutis: Secondary | ICD-10-CM | POA: Diagnosis not present

## 2022-02-08 DIAGNOSIS — Z86007 Personal history of in-situ neoplasm of skin: Secondary | ICD-10-CM | POA: Diagnosis not present

## 2022-02-08 LAB — CBC WITH DIFFERENTIAL/PLATELET
Absolute Monocytes: 507 cells/uL (ref 200–950)
Basophils Absolute: 18 cells/uL (ref 0–200)
Basophils Relative: 0.3 %
Eosinophils Absolute: 118 cells/uL (ref 15–500)
Eosinophils Relative: 2 %
HCT: 39.9 % (ref 35.0–45.0)
Hemoglobin: 12.6 g/dL (ref 11.7–15.5)
Lymphs Abs: 1646 cells/uL (ref 850–3900)
MCH: 27.4 pg (ref 27.0–33.0)
MCHC: 31.6 g/dL — ABNORMAL LOW (ref 32.0–36.0)
MCV: 86.7 fL (ref 80.0–100.0)
MPV: 10.5 fL (ref 7.5–12.5)
Monocytes Relative: 8.6 %
Neutro Abs: 3611 cells/uL (ref 1500–7800)
Neutrophils Relative %: 61.2 %
Platelets: 167 10*3/uL (ref 140–400)
RBC: 4.6 10*6/uL (ref 3.80–5.10)
RDW: 13 % (ref 11.0–15.0)
Total Lymphocyte: 27.9 %
WBC: 5.9 10*3/uL (ref 3.8–10.8)

## 2022-02-08 LAB — COMPLETE METABOLIC PANEL WITH GFR
AG Ratio: 1.8 (calc) (ref 1.0–2.5)
ALT: 15 U/L (ref 6–29)
AST: 23 U/L (ref 10–35)
Albumin: 4.2 g/dL (ref 3.6–5.1)
Alkaline phosphatase (APISO): 56 U/L (ref 37–153)
BUN: 20 mg/dL (ref 7–25)
CO2: 32 mmol/L (ref 20–32)
Calcium: 9.4 mg/dL (ref 8.6–10.4)
Chloride: 103 mmol/L (ref 98–110)
Creat: 0.79 mg/dL (ref 0.60–0.95)
Globulin: 2.4 g/dL (calc) (ref 1.9–3.7)
Glucose, Bld: 93 mg/dL (ref 65–99)
Potassium: 3.4 mmol/L — ABNORMAL LOW (ref 3.5–5.3)
Sodium: 143 mmol/L (ref 135–146)
Total Bilirubin: 0.5 mg/dL (ref 0.2–1.2)
Total Protein: 6.6 g/dL (ref 6.1–8.1)
eGFR: 73 mL/min/{1.73_m2} (ref >=60)

## 2022-02-08 LAB — LIPID PANEL
Cholesterol: 153 mg/dL (ref <200)
HDL: 63 mg/dL (ref >=50)
LDL Cholesterol (Calc): 76 mg/dL (calc)
Non-HDL Cholesterol (Calc): 90 mg/dL (calc) (ref <130)
Total CHOL/HDL Ratio: 2.4 (calc) (ref <5.0)
Triglycerides: 56 mg/dL (ref <150)

## 2022-02-08 LAB — URINALYSIS, ROUTINE W REFLEX MICROSCOPIC
Bacteria, UA: NONE SEEN /HPF
Bilirubin Urine: NEGATIVE
Glucose, UA: NEGATIVE
Hgb urine dipstick: NEGATIVE
Hyaline Cast: NONE SEEN /LPF
Ketones, ur: NEGATIVE
Nitrite: NEGATIVE
Protein, ur: NEGATIVE
Specific Gravity, Urine: 1.01 (ref 1.001–1.035)
pH: 6.5 (ref 5.0–8.0)

## 2022-02-08 LAB — HEMOGLOBIN A1C
Hgb A1c MFr Bld: 5.9 % of total Hgb — ABNORMAL HIGH (ref <5.7)
Mean Plasma Glucose: 123 mg/dL
eAG (mmol/L): 6.8 mmol/L

## 2022-02-08 LAB — MICROALBUMIN / CREATININE URINE RATIO
Creatinine, Urine: 36 mg/dL (ref 20–275)
Microalb Creat Ratio: 39 mcg/mg creat — ABNORMAL HIGH (ref <30)
Microalb, Ur: 1.4 mg/dL

## 2022-02-08 LAB — VITAMIN B12: Vitamin B-12: >2000 pg/mL — ABNORMAL HIGH (ref 200–1100)

## 2022-02-08 LAB — IRON, TOTAL/TOTAL IRON BINDING CAP
%SAT: 32 % (calc) (ref 16–45)
Iron: 97 ug/dL (ref 45–160)
TIBC: 302 mcg/dL (calc) (ref 250–450)

## 2022-02-08 LAB — FERRITIN: Ferritin: 143 ng/mL (ref 16–288)

## 2022-02-08 LAB — MICROSCOPIC MESSAGE

## 2022-02-08 LAB — TSH: TSH: 1.41 mIU/L (ref 0.40–4.50)

## 2022-02-08 LAB — VITAMIN D 25 HYDROXY (VIT D DEFICIENCY, FRACTURES): Vit D, 25-Hydroxy: 107 ng/mL — ABNORMAL HIGH (ref 30–100)

## 2022-02-08 LAB — MAGNESIUM: Magnesium: 1.5 mg/dL (ref 1.5–2.5)

## 2022-02-26 ENCOUNTER — Other Ambulatory Visit: Payer: Self-pay | Admitting: Internal Medicine

## 2022-02-26 DIAGNOSIS — J301 Allergic rhinitis due to pollen: Secondary | ICD-10-CM

## 2022-05-03 ENCOUNTER — Other Ambulatory Visit: Payer: Self-pay | Admitting: Internal Medicine

## 2022-05-03 DIAGNOSIS — E782 Mixed hyperlipidemia: Secondary | ICD-10-CM

## 2022-05-03 MED ORDER — SIMVASTATIN 40 MG PO TABS: ORAL_TABLET | ORAL | 3 refills | Status: DC

## 2022-05-23 ENCOUNTER — Ambulatory Visit: Payer: Medicare Other | Admitting: Internal Medicine

## 2022-05-31 ENCOUNTER — Encounter: Payer: Self-pay | Admitting: Nurse Practitioner

## 2022-05-31 ENCOUNTER — Ambulatory Visit (INDEPENDENT_AMBULATORY_CARE_PROVIDER_SITE_OTHER): Payer: Medicare Other | Admitting: Nurse Practitioner

## 2022-05-31 VITALS — BP 107/63 | HR 65 | Temp 96.8°F | Wt 144.8 lb

## 2022-05-31 DIAGNOSIS — S93401A Sprain of unspecified ligament of right ankle, initial encounter: Secondary | ICD-10-CM

## 2022-05-31 DIAGNOSIS — E559 Vitamin D deficiency, unspecified: Secondary | ICD-10-CM

## 2022-05-31 DIAGNOSIS — Z85118 Personal history of other malignant neoplasm of bronchus and lung: Secondary | ICD-10-CM

## 2022-05-31 DIAGNOSIS — J439 Emphysema, unspecified: Secondary | ICD-10-CM

## 2022-05-31 DIAGNOSIS — R7309 Other abnormal glucose: Secondary | ICD-10-CM | POA: Diagnosis not present

## 2022-05-31 DIAGNOSIS — I10 Essential (primary) hypertension: Secondary | ICD-10-CM | POA: Diagnosis not present

## 2022-05-31 DIAGNOSIS — E782 Mixed hyperlipidemia: Secondary | ICD-10-CM

## 2022-05-31 DIAGNOSIS — Z79899 Other long term (current) drug therapy: Secondary | ICD-10-CM

## 2022-05-31 DIAGNOSIS — I700 Atherosclerosis of aorta: Secondary | ICD-10-CM

## 2022-05-31 NOTE — Progress Notes (Signed)
FOLLOW UP  Assessment and Plan:   1. Essential hypertension Controlled  Continue medications;  Discussed DASH (Dietary Approaches to Stop Hypertension) DASH diet is lower in sodium than a typical American diet. Cut back on foods that are high in saturated fat, cholesterol, and trans fats. Eat more whole-grain foods, fish, poultry, and nuts Remain active and exercise as tolerated daily.  Monitor BP at home-Call if greater than 130/80.   2. Atherosclerosis of aorta ([place]) BY ct SCAN IN 2015 Continue medications;  Discussed lifestyle modifications. Recommended diet heavy in fruits and veggies, omega 3's. Decrease consumption of animal meats, cheeses, and dairy products. Remain active and exercise as tolerated. Continue to monitor.   3. Pulmonary emphysema, unspecified emphysema type ([place]) CXR 02/07/22 Continue CXR Continue medications.  4. Hyperlipidemia, mixed Controlled Continue medications;  Discussed lifestyle modifications. Recommended diet heavy in fruits and veggies, omega 3's. Decrease consumption of animal meats, cheeses, and dairy products. Remain active and exercise as tolerated. Continue to monitor.   5. Personal history of lung cancer Right upper lobe lobectomy  Continue to monitor   6. Vitamin D deficiency Elevated 02/07/2202 Discontinue until levels are WNL  Check Level    7. Abnormal glucose Monitor A1C Monitor intake of carbohydrates, sugars, starches.  8. Medication management All medications discussed and reviewed in full. All questions and concerns regarding medications addressed.    9.  Sprain of right ankle, unspecified ankle, initial encounter. RICE Method. Apply Voltaren gel Monitor for increase in bruising, swelling, redness, difficulty walking. If symptoms worsen contact office for further evaluation.   Continue diet and meds as discussed. Further disposition pending results of labs. Discussed med's effects and SE's.   Over 30  minutes of exam, counseling, chart review, and critical decision making was performed.   Future Appointments  Date Time Provider [place]  02/08/2023  2:00 PM Alycia Rossetti, NP [facility] None    ----------------------------------------------------------------------------------------------------------------------  HPI 88 y.o. female  presents for 3 month follow up on hypertension, cholesterol, diabetes, weight and vitamin D deficiency.   Overall she reports doing well, however, last evening she twisted her right foot when stepping down on a curb.  She notes minimal swelling with soreness.  She has not implemented any treatment.  She continues to work at [employer] in [employer] in [place],  6 hours a day.  No recent SOB, or difficulty breathing.  She takes Singulair daily.    BMI is Body mass index is 26.48 kg/m., she has been working on diet and exercise. Wt Readings from Last 3 Encounters:  05/31/22 144 lb 12.8 oz (65.7 kg)  02/07/22 143 lb 9.6 oz (65.1 kg)  01/04/22 143 lb 12.8 oz (65.2 kg)    Her blood pressure has been controlled at home, today their BP is BP: 107/63 She takes Hydrodiuril and Lopressor.  She does workout. She denies chest pain, shortness of breath, dizziness.   She is on cholesterol medication Simvastatin and denies myalgias. Her cholesterol is at goal. The cholesterol last visit was:   Lab Results  Component Value Date   CHOL 153 02/07/2022   HDL 63 02/07/2022   LDLCALC 76 02/07/2022   TRIG 56 02/07/2022   CHOLHDL 2.4 02/07/2022    She has been working on diet and exercise for prediabetes, and denies polydipsia and polyuria. Last A1C in the office was:  Lab Results  Component Value Date   HGBA1C 5.9 (H) 02/07/2022   Patient is on Vitamin D supplement.  Lab Results  Component Value Date   VD25OH 107 (H) 02/07/2022        Current Medications:  Current Outpatient Medications on File Prior to Visit  Medication Sig    aspirin 81 MG chewable tablet Chew 81 mg by mouth daily.   cetirizine (ZYRTEC) 10 MG tablet Takes 1 tablet Daily   Cholecalciferol 125 MCG (5000 UT) capsule Take 1 capsules Daily (Patient taking differently: Take 1 capsules Daily 10, 000 units)   CINNAMON PO Take 1,000 mg by mouth daily.   Cyanocobalamin (VITAMIN B-12) 1000 MCG SUBL Takes 1 tab SL Daily   hydrochlorothiazide (HYDRODIURIL) 25 MG tablet Take  1 tablet  Daily  for BP & Fluid Retention / Ankle Swelling - Patient knows to take by mouth   metoprolol tartrate (LOPRESSOR) 50 MG tablet Take  1 tablet  2 x /day (every 12 hours)  for BP /Patient knows to take by mouth   montelukast (SINGULAIR) 10 MG tablet Take  1 tablet  Daily for Allergies                                                            /                            TAKE                      BY                     MOUTH   simvastatin (ZOCOR) 40 MG tablet Take  1 tablet  at Bedtime  for Cholesterol                                             /                                 TAKE                  BY                     MOUTH   No current facility-administered medications on file prior to visit.     Allergies:  Allergies  Allergen Reactions   Ace Inhibitors Cough   Calcium-Containing Compounds Other (See Comments)    constpation   Lactose Intolerance (Gi) Diarrhea   Polysporin [Bacitracin-Polymyxin B] Rash    Polysporin or neosporin gave her a rash, couldn't remember which     Medical History:  Past Medical History:  Diagnosis Date   COPD (chronic obstructive pulmonary disease) with emphysema ([place])    COVID-19 12/09/2019   Hyperlipidemia    Hypertension    Lung cancer, upper lobe ([place]) 12/04/1996   RUL   Osteoporosis    S/P partial lobectomy of lung    [id number]   Thigh shingles 12/04/1996   Thoracic aorta atherosclerosis ([place])    Vitamin D deficiency    Family history- Reviewed and unchanged Social history- Reviewed and unchanged   Review of Systems: All  review systems reviewed and negative except for pertinent positives in history of present illness.    Physical Exam: BP 107/63   Pulse 65   Temp (!) 96.8 F (36 C)   Wt 144 lb 12.8 oz (65.7 kg)   SpO2 92%   BMI 26.48 kg/m  Wt Readings from Last 3 Encounters:  05/31/22 144 lb 12.8 oz (65.7 kg)  02/07/22 143 lb 9.6 oz (65.1 kg)  01/04/22 143 lb 12.8 oz (65.2 kg)   General Appearance: Well nourished, in no apparent distress. Eyes: PERRLA, EOMs, conjunctiva no swelling or erythema Sinuses: No Frontal/maxillary tenderness ENT/Mouth: Ext aud canals clear, TMs without erythema, bulging. No erythema, swelling, or exudate on post pharynx.  Tonsils not swollen or erythematous. Hearing normal.  Neck: Supple, thyroid normal.  Respiratory: Respiratory effort normal, BS equal bilaterally without rales, rhonchi, wheezing or stridor.  Cardio: RRR with no MRGs. Brisk peripheral pulses without edema.  Abdomen: Soft, + BS.  Non tender, no guarding, rebound, hernias, masses. Lymphatics: Non tender without lymphadenopathy.  Musculoskeletal: RLE with mild edema and erythema along the lateral metatarsal.  Full ROM, 5/5 strength.  Favors right foot when walking, able to bear weight. Skin: Warm, dry without rashes, lesions, ecchymosis.  Neuro: Cranial nerves intact. No cerebellar symptoms.  Psych: Awake and oriented X 3, normal affect, Insight and Judgment appropriate.    Darrol Jump, NP 11:50 AM [facility]

## 2022-05-31 NOTE — Patient Instructions (Addendum)
Ankle Sprain ? ?An ankle sprain is a stretch or tear in a ligament in the ankle. Ligaments are tissues that connect bones to each other. ?The two most common types of ankle sprains are: ?Inversion sprain. This happens when the foot turns inward and the ankle rolls outward. It affects the ligament on the outside of the foot (lateral ligament). ?Eversion sprain. This happens when the foot turns outward and the ankle rolls inward. It affects the ligament on the inner side of the foot (medial ligament). ?What are the causes? ?This condition is often caused by accidentally rolling or twisting the ankle. ?What increases the risk? ?You are more likely to develop this condition if you play sports. ?What are the signs or symptoms? ?Symptoms of this condition include: ?Pain in your ankle. ?Swelling. ?Bruising. This may develop right after you sprain your ankle or 1-2 days later. ?Trouble standing or walking, especially when you turn or change directions. ?How is this diagnosed? ?This condition is diagnosed with: ?A physical exam. During the exam, your health care provider will press on certain parts of your foot and ankle and try to move them in certain ways. ?X-ray imaging. These may be taken to see how severe the sprain is and to check for broken bones. ?How is this treated? ?This condition may be treated with: ?A brace or splint. This is used to keep the ankle from moving until it heals. ?An elastic bandage. This is used to support the ankle. ?Crutches. ?Pain medicine. ?Surgery. This may be needed if the sprain is severe. ?Physical therapy. This may help to improve the range of motion in the ankle. ?Follow these instructions at home: ?If you have a brace or a splint: ?Wear the brace or splint as told by your health care provider. Remove it only as told by your health care provider. ?Loosen the brace or splint if your toes tingle, become numb, or turn cold and blue. ?Keep the brace or splint clean. ?If the brace or splint  is not waterproof: ?Do not let it get wet. ?Cover it with a watertight covering when you take a bath or a shower. ?If you have an elastic bandage (dressing): ?Remove it to shower or bathe. ?Try not to move your ankle much, but wiggle your toes from time to time. This helps to prevent swelling. ?Adjust the dressing to make it more comfortable if it feels too tight. ?Loosen the dressing if you have numbness or tingling in your foot, or if your foot becomes cold and blue. ?Managing pain, stiffness, and swelling ? ?Take over-the-counter and prescription medicines only as told by your health care provider. ?For 2-3 days, keep your ankle raised (elevated) above the level of your heart as much as possible. ?If directed, put ice on the injured area: ?If you have a removable brace or splint, remove it as told by your health care provider. ?Put ice in a plastic bag. ?Place a towel between your skin and the bag. ?Leave the ice on for 20 minutes, 2-3 times a day. ?General instructions ?Rest your ankle. ?Do not use the injured limb to support your body weight until your health care provider says that you can. Use crutches as told by your health care provider. ?Do not use any products that contain nicotine or tobacco, such as cigarettes, e-cigarettes, and chewing tobacco. If you need help quitting, ask your health care provider. ?Keep all follow-up visits as told by your health care provider. This is important. ?Contact a health care provider   if: ?You have rapidly increasing bruising or swelling. ?Your pain is not relieved with medicine. ?Get help right away if: ?Your foot or toes become numb or blue. ?You have severe pain that gets worse. ?Summary ?An ankle sprain is a stretch or tear in a ligament in the ankle. Ligaments are tissues that connect bones to each other. ?This condition is often caused by accidentally rolling or twisting the ankle. ?Symptoms include pain, swelling, bruising, and trouble walking. ?To relieve pain and  swelling, put ice on the affected ankle, raise your ankle above the level of your heart, and use an elastic bandage. ?Keep all follow-up visits as told by your health care provider. This is important. ?This information is not intended to replace advice given to you by your health care provider. Make sure you discuss any questions you have with your health care provider. ?Document Revised: 01/13/2021 Document Reviewed: 01/13/2021 ?Elsevier Patient Education ? 2023 Elsevier Inc. ? ?

## 2022-06-01 LAB — CBC WITH DIFFERENTIAL/PLATELET
Absolute Monocytes: 663 cells/uL (ref 200–950)
Basophils Absolute: 40 cells/uL (ref 0–200)
Basophils Relative: 0.6 %
Eosinophils Absolute: 101 cells/uL (ref 15–500)
Eosinophils Relative: 1.5 %
HCT: 40 % (ref 35.0–45.0)
Hemoglobin: 13.1 g/dL (ref 11.7–15.5)
Lymphs Abs: 1186 cells/uL (ref 850–3900)
MCH: 27.8 pg (ref 27.0–33.0)
MCHC: 32.8 g/dL (ref 32.0–36.0)
MCV: 84.9 fL (ref 80.0–100.0)
MPV: 11 fL (ref 7.5–12.5)
Monocytes Relative: 9.9 %
Neutro Abs: 4710 cells/uL (ref 1500–7800)
Neutrophils Relative %: 70.3 %
Platelets: 168 10*3/uL (ref 140–400)
RBC: 4.71 10*6/uL (ref 3.80–5.10)
RDW: 12.9 % (ref 11.0–15.0)
Total Lymphocyte: 17.7 %
WBC: 6.7 10*3/uL (ref 3.8–10.8)

## 2022-06-01 LAB — LIPID PANEL
Cholesterol: 168 mg/dL (ref <200)
HDL: 65 mg/dL (ref >=50)
LDL Cholesterol (Calc): 85 mg/dL (calc)
Non-HDL Cholesterol (Calc): 103 mg/dL (calc) (ref <130)
Total CHOL/HDL Ratio: 2.6 (calc) (ref <5.0)
Triglycerides: 85 mg/dL (ref <150)

## 2022-06-01 LAB — COMPLETE METABOLIC PANEL WITH GFR
AG Ratio: 2 (calc) (ref 1.0–2.5)
ALT: 16 U/L (ref 6–29)
AST: 25 U/L (ref 10–35)
Albumin: 4.3 g/dL (ref 3.6–5.1)
Alkaline phosphatase (APISO): 68 U/L (ref 37–153)
BUN/Creatinine Ratio: 20 (calc) (ref 6–22)
BUN: 21 mg/dL (ref 7–25)
CO2: 31 mmol/L (ref 20–32)
Calcium: 9.5 mg/dL (ref 8.6–10.4)
Chloride: 104 mmol/L (ref 98–110)
Creat: 1.03 mg/dL — ABNORMAL HIGH (ref 0.60–0.95)
Globulin: 2.1 g/dL (calc) (ref 1.9–3.7)
Glucose, Bld: 91 mg/dL (ref 65–99)
Potassium: 3.6 mmol/L (ref 3.5–5.3)
Sodium: 143 mmol/L (ref 135–146)
Total Bilirubin: 0.5 mg/dL (ref 0.2–1.2)
Total Protein: 6.4 g/dL (ref 6.1–8.1)
eGFR: 53 mL/min/{1.73_m2} — ABNORMAL LOW (ref >=60)

## 2022-06-01 LAB — HEMOGLOBIN A1C
Hgb A1c MFr Bld: 5.8 % of total Hgb — ABNORMAL HIGH (ref <5.7)
Mean Plasma Glucose: 120 mg/dL
eAG (mmol/L): 6.6 mmol/L

## 2022-06-01 LAB — VITAMIN D 25 HYDROXY (VIT D DEFICIENCY, FRACTURES): Vit D, 25-Hydroxy: 84 ng/mL (ref 30–100)

## 2022-06-25 ENCOUNTER — Other Ambulatory Visit: Payer: Self-pay | Admitting: Internal Medicine

## 2022-06-25 DIAGNOSIS — I10 Essential (primary) hypertension: Secondary | ICD-10-CM

## 2022-06-25 MED ORDER — HYDROCHLOROTHIAZIDE 25 MG PO TABS: ORAL_TABLET | ORAL | 3 refills | Status: DC

## 2022-06-25 MED ORDER — METOPROLOL TARTRATE 50 MG PO TABS: ORAL_TABLET | ORAL | 3 refills | Status: DC

## 2022-08-04 DIAGNOSIS — H26493 Other secondary cataract, bilateral: Secondary | ICD-10-CM | POA: Diagnosis not present

## 2022-08-04 DIAGNOSIS — Z961 Presence of intraocular lens: Secondary | ICD-10-CM | POA: Diagnosis not present

## 2022-08-22 ENCOUNTER — Other Ambulatory Visit: Payer: Self-pay | Admitting: Internal Medicine

## 2022-08-22 DIAGNOSIS — Z1231 Encounter for screening mammogram for malignant neoplasm of breast: Secondary | ICD-10-CM

## 2022-09-10 ENCOUNTER — Emergency Department (HOSPITAL_COMMUNITY): Payer: Medicare Other

## 2022-09-10 ENCOUNTER — Observation Stay (HOSPITAL_COMMUNITY)
Admission: EM | Admit: 2022-09-10 | Discharge: 2022-09-13 | Disposition: A | Discharge status: Home Health | Payer: Medicare Other | Attending: Family Medicine | Admitting: Family Medicine

## 2022-09-10 ENCOUNTER — Other Ambulatory Visit: Payer: Self-pay

## 2022-09-10 ENCOUNTER — Encounter (HOSPITAL_COMMUNITY): Payer: Self-pay

## 2022-09-10 DIAGNOSIS — Z8616 Personal history of COVID-19: Secondary | ICD-10-CM | POA: Diagnosis not present

## 2022-09-10 DIAGNOSIS — Z902 Acquired absence of lung [part of]: Secondary | ICD-10-CM | POA: Diagnosis not present

## 2022-09-10 DIAGNOSIS — R55 Syncope and collapse: Secondary | ICD-10-CM

## 2022-09-10 DIAGNOSIS — E876 Hypokalemia: Secondary | ICD-10-CM | POA: Diagnosis not present

## 2022-09-10 DIAGNOSIS — Z9889 Other specified postprocedural states: Secondary | ICD-10-CM | POA: Insufficient documentation

## 2022-09-10 DIAGNOSIS — I2510 Atherosclerotic heart disease of native coronary artery without angina pectoris: Secondary | ICD-10-CM | POA: Diagnosis not present

## 2022-09-10 DIAGNOSIS — I10 Essential (primary) hypertension: Secondary | ICD-10-CM | POA: Diagnosis present

## 2022-09-10 DIAGNOSIS — I700 Atherosclerosis of aorta: Secondary | ICD-10-CM | POA: Diagnosis not present

## 2022-09-10 DIAGNOSIS — R000 Tachycardia, unspecified: Secondary | ICD-10-CM | POA: Diagnosis not present

## 2022-09-10 DIAGNOSIS — Z79899 Other long term (current) drug therapy: Secondary | ICD-10-CM | POA: Diagnosis not present

## 2022-09-10 DIAGNOSIS — Z87891 Personal history of nicotine dependence: Secondary | ICD-10-CM | POA: Insufficient documentation

## 2022-09-10 DIAGNOSIS — R001 Bradycardia, unspecified: Secondary | ICD-10-CM | POA: Diagnosis not present

## 2022-09-10 DIAGNOSIS — J449 Chronic obstructive pulmonary disease, unspecified: Secondary | ICD-10-CM | POA: Diagnosis not present

## 2022-09-10 DIAGNOSIS — Z85118 Personal history of other malignant neoplasm of bronchus and lung: Secondary | ICD-10-CM | POA: Insufficient documentation

## 2022-09-10 DIAGNOSIS — Z7982 Long term (current) use of aspirin: Secondary | ICD-10-CM | POA: Diagnosis not present

## 2022-09-10 DIAGNOSIS — R0902 Hypoxemia: Secondary | ICD-10-CM | POA: Diagnosis not present

## 2022-09-10 DIAGNOSIS — Z743 Need for continuous supervision: Secondary | ICD-10-CM | POA: Diagnosis not present

## 2022-09-10 DIAGNOSIS — R400 Somnolence: Secondary | ICD-10-CM | POA: Diagnosis not present

## 2022-09-10 LAB — CBC WITH DIFFERENTIAL/PLATELET
Abs Immature Granulocytes: 0.02 10*3/uL (ref 0.00–0.07)
Basophils Absolute: 0 10*3/uL (ref 0.0–0.1)
Basophils Relative: 1 %
Eosinophils Absolute: 0 10*3/uL (ref 0.0–0.5)
Eosinophils Relative: 0 %
HCT: 42.7 % (ref 36.0–46.0)
Hemoglobin: 14.2 g/dL (ref 12.0–15.0)
Immature Granulocytes: 0 %
Lymphocytes Relative: 16 %
Lymphs Abs: 1 10*3/uL (ref 0.7–4.0)
MCH: 28.6 pg (ref 26.0–34.0)
MCHC: 33.3 g/dL (ref 30.0–36.0)
MCV: 85.9 fL (ref 80.0–100.0)
Monocytes Absolute: 0.5 10*3/uL (ref 0.1–1.0)
Monocytes Relative: 8 %
Neutro Abs: 4.6 10*3/uL (ref 1.7–7.7)
Neutrophils Relative %: 75 %
Platelets: 176 10*3/uL (ref 150–400)
RBC: 4.97 MIL/uL (ref 3.87–5.11)
RDW: 13.3 % (ref 11.5–15.5)
WBC: 6.1 10*3/uL (ref 4.0–10.5)
nRBC: 0 % (ref 0.0–0.2)

## 2022-09-10 LAB — COMPREHENSIVE METABOLIC PANEL
ALT: 22 U/L (ref 0–44)
AST: 37 U/L (ref 15–41)
Albumin: 4.2 g/dL (ref 3.5–5.0)
Alkaline Phosphatase: 54 U/L (ref 38–126)
Anion gap: 14 (ref 5–15)
BUN: 15 mg/dL (ref 8–23)
CO2: 28 mmol/L (ref 22–32)
Calcium: 9.8 mg/dL (ref 8.9–10.3)
Chloride: 101 mmol/L (ref 98–111)
Creatinine, Ser: 0.9 mg/dL (ref 0.44–1.00)
GFR, Estimated: >60 mL/min (ref >60)
Glucose, Bld: 152 mg/dL — ABNORMAL HIGH (ref 70–99)
Potassium: 3.3 mmol/L — ABNORMAL LOW (ref 3.5–5.1)
Sodium: 143 mmol/L (ref 135–145)
Total Bilirubin: 0.9 mg/dL (ref 0.3–1.2)
Total Protein: 6.9 g/dL (ref 6.5–8.1)

## 2022-09-10 LAB — TROPONIN I (HIGH SENSITIVITY)
Troponin I (High Sensitivity): 14 ng/L (ref <18)
Troponin I (High Sensitivity): 14 ng/L (ref <18)

## 2022-09-10 LAB — TSH: TSH: 1.738 u[IU]/mL (ref 0.350–4.500)

## 2022-09-10 LAB — MAGNESIUM: Magnesium: 1.6 mg/dL — ABNORMAL LOW (ref 1.7–2.4)

## 2022-09-10 MED ORDER — MONTELUKAST SODIUM 10 MG PO TABS
10.0000 mg | ORAL_TABLET | Freq: Every day | ORAL | Status: DC
  Administered 2022-09-10 – 2022-09-12 (×3): 10 mg via ORAL
  Filled 2022-09-10 (×3): qty 1

## 2022-09-10 MED ORDER — ENOXAPARIN SODIUM 40 MG/0.4ML IJ SOSY: 40.0000 mg | PREFILLED_SYRINGE | INTRAMUSCULAR | Status: DC

## 2022-09-10 MED ORDER — POTASSIUM CHLORIDE 20 MEQ PO PACK
40.0000 meq | PACK | Freq: Two times a day (BID) | ORAL | Status: AC
  Administered 2022-09-10 – 2022-09-11 (×2): 40 meq via ORAL
  Filled 2022-09-10 (×2): qty 2

## 2022-09-10 MED ORDER — SODIUM CHLORIDE 0.9% FLUSH
3.0000 mL | Freq: Two times a day (BID) | INTRAVENOUS | Status: DC
  Administered 2022-09-11 – 2022-09-12 (×3): 3 mL via INTRAVENOUS

## 2022-09-10 MED ORDER — VITAMIN B-12 1000 MCG PO TABS
1000.0000 ug | ORAL_TABLET | Freq: Every day | ORAL | Status: DC
  Administered 2022-09-11 – 2022-09-13 (×3): 1000 ug via ORAL
  Filled 2022-09-10 (×3): qty 1

## 2022-09-10 MED ORDER — SIMVASTATIN 20 MG PO TABS
40.0000 mg | ORAL_TABLET | Freq: Every day | ORAL | Status: DC
  Administered 2022-09-11 – 2022-09-12 (×2): 40 mg via ORAL
  Filled 2022-09-10 (×2): qty 2

## 2022-09-10 MED ORDER — LORATADINE 10 MG PO TABS
10.0000 mg | ORAL_TABLET | Freq: Every day | ORAL | Status: DC
  Administered 2022-09-11 – 2022-09-13 (×3): 10 mg via ORAL
  Filled 2022-09-10 (×3): qty 1

## 2022-09-10 MED ORDER — LORATADINE 10 MG PO TABS
10.0000 mg | ORAL_TABLET | Freq: Once | ORAL | Status: AC
  Administered 2022-09-11: 10 mg via ORAL
  Filled 2022-09-10: qty 1

## 2022-09-10 MED ORDER — VITAMIN D 25 MCG (1000 UNIT) PO TABS
5000.0000 [IU] | ORAL_TABLET | Freq: Every day | ORAL | Status: DC
  Administered 2022-09-11 – 2022-09-13 (×3): 5000 [IU] via ORAL
  Filled 2022-09-10 (×3): qty 5

## 2022-09-10 MED ORDER — ASPIRIN 81 MG PO CHEW
81.0000 mg | CHEWABLE_TABLET | Freq: Every day | ORAL | Status: DC
  Administered 2022-09-11 – 2022-09-13 (×3): 81 mg via ORAL
  Filled 2022-09-10 (×3): qty 1

## 2022-09-10 MED ORDER — CINNAMON 500 MG PO TABS: 1000.0000 mg | ORAL_TABLET | Freq: Every day | ORAL | Status: DC

## 2022-09-10 MED ORDER — MAGNESIUM SULFATE 2 GM/50ML IV SOLN
2.0000 g | Freq: Once | INTRAVENOUS | Status: AC
  Administered 2022-09-10: 2 g via INTRAVENOUS
  Filled 2022-09-10: qty 50

## 2022-09-10 NOTE — ED Triage Notes (Signed)
Pt BIB [facility] coming from home. Pt with 3 reported episodes of syncope over the weekend. Today's episode was witnessed by family and lasted approx 2 minutes. EMS noted pt had a transient episode of bradycardia in the 20's.During transport pt had 2 episodes of near syncope with bradycardia.

## 2022-09-10 NOTE — Assessment & Plan Note (Addendum)
Given IV Mg sulfate 2g for Mg 1.6. Goal Mg >2, decreased from 6.5 to 6.3

## 2022-09-10 NOTE — Assessment & Plan Note (Signed)
Coronary calcifications seen on previous CT imaging. -Continue 81 mg ASA, simvastatin daily

## 2022-09-10 NOTE — H&P (Cosign Needed Addendum)
Hospital Admission History and Physical Service Pager: [phone]  Patient name: Penny [name] Medical record number: [id number] Date of Birth: 1933-04-22 Age: 88 y.o. Gender: female  Primary Care Provider: Unk Pinto, MD Consultants: Cardiology  Code Status: FULL CODE  Preferred Emergency Contact:  Contact Information     Name Relation Home Work Mobile   Hutchinson,Sandee Daughter [phone]   Pachia, Strum [phone]         Chief Complaint: Syncope   Assessment and Plan: Bobbette Eakes is a 88 y.o. female presenting with multiple episodes of syncope with concern for symptomatic bradycardia.  Differential diagnoses and management as below.  * Symptomatic sinus bradycardia Has brief episodes of sinus bradycardia lasting around 1 minute and usually resulting in syncope with urinary incontinence.  Patient is sitting during these episodes and has not fallen or hit her head. unclear etiology with no known triggers but may be related to vasovagal syncope vs nodal dysfunction vs metoprolol (pt takes for BP). On the day of admission, had 2 documented episodes of sinus bradycardia (1 with EMS, 1 in the ED).  However, did not pass out with these episodes.  No CP/SOB or N/V with her episodes, with normal troponin and no ischemic changes on EKG to suggest ACS.  No seizure-like activity or postictal confusion to suggest seizures.  Neurologic exam without any focal deficits.  On admission, vitals stable and patient asymptomatic with benign cardiopulmonary exam. However, given frequency of these episodes would benefit from further monitoring and cardiac workup. -Admit to [facility], med telemetry, attending Dr. Andria Frames -Cardiology consulted in the ED, appreciate recs -Continuous telemetry -Holding metoprolol and HCTZ -echocardiogram -orthostatics -PT/OT -if recurrent episodes of symptomatic bradycardia, may consider pacemaker vs atropine  CAD  (coronary artery disease) Coronary calcifications seen on previous CT imaging. -Continue 81 mg ASA, simvastatin daily   Hypomagnesemia Given IV Mg sulfate 2g for Mg 1.6. Goal Mg >2  Hypokalemia Given KCl 15meq x2 for K 3.3. Goal K >4  Essential hypertension BP 140s/80. On Metoprolol and HCTZ at home.  -Will hold home medications for now -Vitals per floor routine   Chronic and stable:  HLD: last lipid panel 05/2022 with LDL 85. Continued simvastatin, ASA COPD: not on any home meds. SpO2 saturation goals 88-92% Hx lung cancer s/p right partial lung resection    FEN/GI: Heart healthy diet VTE Prophylaxis: lovenox  Disposition: med tele  History of Present Illness:  Penny [name] is a 88 y.o. female presenting with syncopal episodes  States that all of a sudden in the last few days "something would come over me". She describes it as heat rising up her chest. This feeling would subsequently cause her to pass out. It has happened to her 3-4 times since Friday. An episode occurred with EMS, she states that this episode was "not as severe" since she did not black out. EMS did note that she became significantly bradycardic. No one was around to witness the first episode on Friday. She is unaware of how long she may have been passed out but she thinks it likely was just a brief moment. She did have urinary incontinence with this. She has been sitting for all the events, one episode also occurred at night, "while I was sleeping" she believes it happened since she was also incontinent of urine. The syncopal episodes have not caused her to fall. No head injuries.   Her grandson witnessed the episode today which lasted about 1 minute.  No shaking episodes. She did feel tired after coming to. Denies any confusion after the episode. She can't quite think of any triggering factor that has caused it- thought maybe it was after eating but she did have another episode while in the ED and she hadn't  eaten anything prior. Daughter reports that her HR came down to the 30s. She did not pass out during the episode. Her HR then improved up to 90s-100s within a minute.   Has SOB with exertion, this is chronic and has been present since her lung resection. No nausea. No recent illness. Denies any headaches. She takes Ibuprofen when she works for her lower back pain. No radiating pain down her legs. No dysuria, fevers, dizziness, CP, SOB.   In the ED, cardiology was consulted and suggested holding metoprolol and performing echo  Review Of Systems: Per HPI.  Pertinent Past Medical History: HLD COPD  Lung Cancer s/p right partial lung removal  Remainder reviewed in history tab.   Pertinent Past Surgical History: Right partial lung resection   Tonsillectomy  Right shoulder adhesion Carpal tunnel release: right  Remainder reviewed in history tab.  Pertinent Social History: Tobacco use: Former heavy smoker, quit 20-30 years ago after lung cancer dx.  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  Can do all IADLs.   Pertinent Family History: Father with lung cancer Mother with angina   Remainder reviewed in history tab.   Important Outpatient Medications: Metoprolol 50 mg BID Simvastatin 40 mg daily HCTZ 25 mg daily  Aspirin 81 mg daily  Vitamin B12 Cinnamon  Vitamin D Zyrtec  Montelukast  Remainder reviewed in medication history.   Objective: BP 118/63   Pulse (!) 101   Temp 98.4 F (36.9 C) (Oral)   Resp 20   Ht 5\' 2"  (1.575 m)   Wt 64.9 kg   SpO2 92%   BMI 26.16 kg/m  Exam: General: NAD, awake and alert, conversational and lighthearted Cardiovascular: RRR no MRG Respiratory: CTAB normal WOB on RA Gastrointestinal: Soft, NT/ND, normal BS MSK: 5/5 strength upper and lower extremities Derm: No bruises noted, normal skin thinning  with age  Neuro: No focal deficits noted, speech is clear, AxO x4, able to follow commands, 5/5 strength Psych: Normal mentation  Labs:  CBC BMET  Recent Labs  Lab 09/10/22 1749  WBC 6.1  HGB 14.2  HCT 42.7  PLT 176   Recent Labs  Lab 09/10/22 1749  NA 143  K 3.3*  CL 101  CO2 28  BUN 15  CREATININE 0.90  GLUCOSE 152*  CALCIUM 9.8    Troponin  14 TSH 1.738 Mg 1.6  EKG: NSR. Rhythm strips in the room with NSR and brief episodes of sinus bradycardia   Imaging Studies Performed:  CXR IMPRESSION: No active disease.   August Albino, MD 09/10/2022, 10:22 PM PGY-1, [place] Intern pager: [phone], text pages welcome Secure chat group [place] Upper-Level Resident Addendum   I have independently interviewed and examined the patient. I have discussed the above with the original author and agree with their documentation. My edits for correction/addition/clarification are included where appropriate. Please see also any attending notes.   Sharion Settler, DO PGY-3, [facility] 09/10/2022 10:41 PM  [place] Service pager: [phone] (text  pages welcome through [facility])

## 2022-09-10 NOTE — Assessment & Plan Note (Addendum)
Patient seen this morning, denies CP/SOB or N/V with her episodes, with normal troponin and no ischemic changes on EKG to suggest ACS. Neurologic exam without any focal deficits.  On admission, vitals stable and patient asymptomatic with benign cardiopulmonary exam. However, given frequency of these episodes would benefit from further monitoring and cardiac workup. - Cardiology following, appreciate recs - Zio montior ordered, to be placed at discharge and follow up with Cardiology.  - Continuous telemetry  -Metoprolol permanently discontinued - Echocardiogram unremarkable, LVEF 60-65% - Orthostatics - Ambulate w/ telemetry prior to discharge - PT/OT - if recurrent episodes of symptomatic bradycardia, may consider pacemaker vs atropine

## 2022-09-10 NOTE — Assessment & Plan Note (Signed)
BP 140s/80. On Metoprolol and HCTZ at home.  -Will hold home medications for now -Vitals per floor routine

## 2022-09-10 NOTE — ED Notes (Signed)
Received verbal report from [place] at this time

## 2022-09-10 NOTE — ED Provider Notes (Signed)
[facility] EMERGENCY DEPARTMENT Provider Note   CSN: [id number] Arrival date & time: 09/10/22  1740     History  No chief complaint on file.   Penny [name] is a 88 y.o. female.  Patient is an 87 year old female with a history of hypertension, hyperlipidemia, prior lung cancer status post lung resection on the right who is presenting today with multiple episodes of syncope over the last 2 days.  Patient reports the first episode occurred on Friday evening she was sitting on her couch when she woke up and noticed she had urinated on herself but reported otherwise she felt fine.  Prior to the episode she reports she feels a sensation in her upper stomach that washes over her and then she does not remember anything else.  She had a second episode Saturday morning that she woke up in her bed and was incontinent which she reports she had never had before and suspected she had another event.  Today her grandson was bringing her some barbecue and she was ready to sit down and eat when she felt the sensation again.  He reports that he walked over to her and she slumped over for short moment and then came right back to.  She denies any chest pain or shortness of breath.  She has not had any changes in her medications.  She is overall felt well.  She denies any history of heart disease or episodes of syncope in the past.  EMS reports upon their arrival they were at the house for about an hour and she had a rhythm in the 80s or 90s.  They were trying to talk with her doctor and discuss a plan when she suddenly reported the symptoms were happening.  They reported her heart rate was in the 90s initially then dropped to 60s and then dropped to 28 but resolved within 30 seconds.  She then had a second episode when she arrived at the emergency room when she started feeling poorly again and she had some ectopy short run of V. tach and then had another bradycardic rhythm.  Patient currently  has no complaints.  The history is provided by the patient.       Home Medications Prior to Admission medications   Medication Sig Start Date End Date Taking? Authorizing Provider  aspirin 81 MG chewable tablet Chew 81 mg by mouth daily.    [provider]  cetirizine (ZYRTEC) 10 MG tablet Takes 1 tablet Daily 05/02/19   Unk Pinto, MD  Cholecalciferol 125 MCG (5000 UT) capsule Take 1 capsules Daily Patient taking differently: Take 1 capsules Daily 10, 000 units 12/01/21   Liane Comber, NP  CINNAMON PO Take 1,000 mg by mouth daily.    [provider]  Cyanocobalamin (VITAMIN B-12) 1000 MCG SUBL Takes 1 tab SL Daily 05/02/19   Unk Pinto, MD  hydrochlorothiazide (HYDRODIURIL) 25 MG tablet Take  1 tablet  Daily  for BP & Fluid Retention / Ankle Swelling                                   /   TAKE                        BY                 MOUTH 06/25/22   Unk Pinto, MD  metoprolol tartrate (LOPRESSOR) 50 MG tablet Take  1 tablet   2 x /day (every 12 hours)  for BP                                           /                            TAKE                        BY                    MOUTH 06/25/22   Unk Pinto, MD  montelukast (SINGULAIR) 10 MG tablet Take  1 tablet  Daily for Allergies                                                            /                            TAKE                      BY                     MOUTH 02/26/22   Unk Pinto, MD  simvastatin (ZOCOR) 40 MG tablet Take  1 tablet  at Bedtime  for Cholesterol                                             /                                 TAKE                  BY                     MOUTH 05/03/22   Unk Pinto, MD      Allergies    Ace inhibitors, Calcium-containing compounds, Lactose intolerance (gi), and Polysporin [bacitracin-polymyxin b]    Review of Systems   Review of Systems  Physical Exam Updated Vital Signs BP (!) 146/88   Pulse 83   Temp 98.7 F (37.1 C)  (Oral)   Resp (!) 21   Ht 5\' 2"  (1.575 m)   Wt 64.9 kg   SpO2 93%   BMI 26.16 kg/m  Physical Exam Vitals and nursing note reviewed.  Constitutional:      General: She is not in acute distress.    Appearance: She is well-developed.  HENT:     Head: Normocephalic and atraumatic.  Eyes:     Pupils: Pupils are equal, round, and reactive to light.  Cardiovascular:     Rate and Rhythm: Normal rate and regular rhythm.     Pulses: Normal pulses.     Heart sounds: Normal heart sounds. No murmur heard.  No friction rub.  Pulmonary:     Effort: Pulmonary effort is normal.     Breath sounds: Normal breath sounds. No wheezing or rales.  Abdominal:     General: Bowel sounds are normal. There is no distension.     Palpations: Abdomen is soft.     Tenderness: There is no abdominal tenderness. There is no guarding or rebound.  Musculoskeletal:        General: No tenderness. Normal range of motion.     Right lower leg: No edema.     Left lower leg: No edema.     Comments: No edema  Skin:    General: Skin is warm and dry.     Findings: No rash.  Neurological:     Mental Status: She is alert and oriented to person, place, and time. Mental status is at baseline.     Cranial Nerves: No cranial nerve deficit.  Psychiatric:        Mood and Affect: Mood normal.        Behavior: Behavior normal.     ED Results / Procedures / Treatments   Labs (all labs ordered are listed, but only abnormal results are displayed) Labs Reviewed  COMPREHENSIVE METABOLIC PANEL - Abnormal; Notable for the following components:      Result Value   Potassium 3.3 (*)    Glucose, Bld 152 (*)    All other components within normal limits  MAGNESIUM - Abnormal; Notable for the following components:   Magnesium 1.6 (*)    All other components within normal limits  CBC WITH DIFFERENTIAL/PLATELET  TSH  TROPONIN I (HIGH SENSITIVITY)  TROPONIN I (HIGH SENSITIVITY)    EKG EKG  Interpretation  Date/Time:  Sunday September 10 2022 17:54:36 EDT Ventricular Rate:  95 PR Interval:  175 QRS Duration: 91 QT Interval:  332 QTC Calculation: 418 R Axis:   12 Text Interpretation: Sinus rhythm new Ventricular premature complex Probable left atrial enlargement Low voltage, precordial leads Confirmed by Blanchie Dessert ([id number]) on 09/10/2022 6:19:12 PM  Radiology DG Chest Port 1 View  Result Date: 09/10/2022 CLINICAL DATA:  Syncope. EXAM: PORTABLE CHEST 1 VIEW COMPARISON:  February 07, 2022 FINDINGS: The heart size and mediastinal contours are within normal limits. There is moderate to marked severity calcification of the aortic arch. Radiopaque surgical clips are seen along the suprahilar region on the right. Both lungs are clear. Chronic changes are seen involving multiple right ribs. IMPRESSION: No active disease. Electronically Signed   By: Virgina Norfolk M.D.   On: 09/10/2022 18:03    Procedures Procedures    Medications Ordered in ED Medications - No data to display  ED Course/ Medical Decision Making/ A&P                           Medical Decision Making Amount and/or Complexity of Data Reviewed External Data Reviewed: notes.    Details: pcp Labs: ordered. Decision-making details documented in ED Course. Radiology: ordered and independent interpretation performed. Decision-making details documented in ED Course. ECG/medicine tests: ordered and independent interpretation performed. Decision-making details documented in ED Course.  Risk Decision regarding hospitalization.   Pt with multiple medical problems and comorbidities and presenting today with a complaint that caries a high risk for morbidity and mortality.  Here today with multiple episodes of syncope in the last 2 days.  Patient was on the monitor during one of the events with EMS and went into a  bradycardic rhythm which EMS reports she dropped as low as 28.  She did not have loss of consciousness with  EMS.  Patient denies any symptoms at this time.  She denies any recent systemic illness.  Vital signs are reassuring at that time.  Low suspicion for acute lung pathology such as COPD exacerbation or a PE.  Lower suspicion for electrolyte abnormality as patient has been eating and drinking normally.  Will ensure no electrolyte abnormalities or new thyroid disease.  However concern for primary electrical problem.  Will discuss with cardiology as patient may need a pacemaker.  She is currently on the pacer pads and continuous cardiac monitoring if she has any further events.  8:24 PM Pt had another event here while on the monitor which dropped to a heart rate of 37 which appeared to be sinus.  I independently interpreted patient's EKG and labs.  EKG shows a sinus rhythm without acute findings at this time.  CBC, CMP, TSH are all within normal limits.  Troponin is normal and magnesium is mildly low at 1.6.  I have independently visualized and interpreted pt's images today.CXR wnl. Cardiology is evaluating the patient at this time.  8:24 PM Pt seeming to have vasovagal events with sinus bradycardia.  Pt is incontinent of urine when it happens.  He requests d/c metoprolol which pt did not take today and echo in the morning with ongoing monitoring.  Requires admission and cardiology request that medicine admit.        Final Clinical Impression(s) / ED Diagnoses Final diagnoses:  Syncope, unspecified syncope type  Bradycardia    Rx / DC Orders ED Discharge Orders     None         Blanchie Dessert, MD 09/10/22 2024

## 2022-09-10 NOTE — Consult Note (Signed)
Cardiology Consultation:   Patient ID: Penny [name] MRN: [id number]; DOB: [birth year]  Admit date: 09/10/2022 Date of Consult: 09/10/2022  Primary Care Provider: Unk Pinto, MD [facility] Cardiologist: None  [facility] Electrophysiologist:  None    Patient Profile:   Penny [name] is a 88 y.o. female with a hx of COPD, right lung cancer status post wedge resection, aortic atherosclerosis, hypertension, hyperlipidemia who is being seen today for the evaluation of syncope at the request of Dr. Maryan Rued.   History of Present Illness:   Penny [name] reports this past Wednesday, she felt "poorly". Thursday she felt normal. Friday she reports that while sitting on the sofa, she had a sensation of "something taking over, and the next thing I was out". She reports waking up, being unsure how long she had passed out for. She was incontinent of urine during this episode.  Saturday, she woke up in the morning "feeling funny", and noted she had urinated on herself while sleeping. She had an unremarkable rest of her Saturday. Sunday (today), when about to eat a bite of barbeque, she felt like she was going to pass out, and then immediately passed out for 1-2 minutes.  For this episode, EMS was called. Upon EMS arrival, she had a 3rd episode while sitting on her couch where she passed out with telemetry on. In the ambulance, she had another episode where she felt "funny" with a "wave of something", but didn't pass out. She denies new medications, chest pain, shortness of breath.  On ED arrival, EKS showed NSR. In the ED, her HR dropped to 37, and during that time felt the sensation like she was going to pass out.   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 Lives with family (daughter).  No changes to metoprolol dosing recently.  Past Medical History:  Diagnosis Date   COPD (chronic  obstructive pulmonary disease) with emphysema ([place])    COVID-19 12/09/2019   Hyperlipidemia    Hypertension    Lung cancer, upper lobe ([place]) 12/04/1996   RUL   Osteoporosis    S/P partial lobectomy of lung    [id number]   Thigh shingles 12/04/1996   Thoracic aorta atherosclerosis ([place])    Vitamin D deficiency     Past Surgical History:  Procedure Laterality Date   CARPAL TUNNEL RELEASE Right    2009   LUNG REMOVAL, PARTIAL Right    RUL 1998   OLECRANON BURSECTOMY Left 03/20/2017   Procedure: EXICISION OLECRANON BURSA LEFT ELBOW;  Surgeon: Daryll Brod, MD;  Location: [facility];  Service: Orthopedics;  Laterality: Left;  with scb block in preop   SHOULDER ADHESION RELEASE Right    2004     Home Medications:  Prior to Admission medications   Medication Sig Start Date End Date Taking? Authorizing Provider  aspirin 81 MG chewable tablet Chew 81 mg by mouth daily.    [provider]  cetirizine (ZYRTEC) 10 MG tablet Takes 1 tablet Daily 05/02/19   Unk Pinto, MD  Cholecalciferol 125 MCG (5000 UT) capsule Take 1 capsules Daily Patient taking differently: Take 1 capsules Daily 10, 000 units 12/01/21   Liane Comber, NP  CINNAMON PO Take 1,000 mg by mouth daily.    [provider]  Cyanocobalamin (VITAMIN B-12) 1000 MCG SUBL Takes 1 tab SL Daily 05/02/19   Unk Pinto, MD  hydrochlorothiazide (HYDRODIURIL)  25 MG tablet Take  1 tablet  Daily  for BP & Fluid Retention / Ankle Swelling                                   /   TAKE                        BY                 MOUTH 06/25/22   Unk Pinto, MD  metoprolol tartrate (LOPRESSOR) 50 MG tablet Take  1 tablet   2 x /day (every 12 hours)  for BP                                           /                            TAKE                        BY                    MOUTH 06/25/22   Unk Pinto, MD  montelukast (SINGULAIR) 10 MG tablet Take  1 tablet  Daily for Allergies                                                             /                            TAKE                      BY                     MOUTH 02/26/22   Unk Pinto, MD  simvastatin (ZOCOR) 40 MG tablet Take  1 tablet  at Bedtime  for Cholesterol                                             /                                 TAKE                  BY                     MOUTH 05/03/22   Unk Pinto, MD    Inpatient Medications: Scheduled Meds:  Continuous Infusions:  PRN Meds:   Allergies:    Allergies  Allergen Reactions   Ace Inhibitors Cough   Calcium-Containing Compounds Other (See Comments)    constpation   Lactose Intolerance (Gi) Diarrhea   Polysporin [Bacitracin-Polymyxin B] Rash    Polysporin or neosporin gave her a rash, couldn't remember  which    Social History:   Social History   Socioeconomic History   Marital status: Widowed    Spouse name: Not on file   Number of children: Not on file   Years of education: Not on file   Highest education level: Not on file  Occupational History   Not on file  Tobacco Use   Smoking status: Former    Types: Cigarettes    Quit date: 12/04/1996    Years since quitting: 25.7   Smokeless tobacco: Never  Vaping Use   Vaping Use: Never used  Substance and Sexual Activity   Alcohol use: Yes    Alcohol/week: 1.0 standard drink of alcohol    Types: 1 Glasses of wine per week    Comment: social   Drug use: No   Sexual activity: Not on file  Other Topics Concern   Not on file  Social History Narrative   Not on file   Social Determinants of Health   Financial Resource Strain: Not on file  Food Insecurity: Not on file  Transportation Needs: Not on file  Physical Activity: Not on file  Stress: Not on file  Social Connections: Not on file  Intimate Partner Violence: Not on file    Family History:   Family History  Problem Relation Age of Onset   Heart disease Mother    Lung cancer Father    Breast cancer Paternal Aunt    Denies relevant  cardiac family history  ROS:  Review of Systems: [y] = yes, [ ]  = no      General: Weight gain [ ] ; Weight loss [ ] ; Anorexia [ ] ; Fatigue [ ] ; Fever [ ] ; Chills [ ] ; Weakness [ ]    Cardiac: Chest pain/pressure [ ] ; Resting SOB [ ] ; Exertional SOB [ ] ; Orthopnea [ ] ; Pedal Edema [ ] ; Palpitations [y]; Syncope [y]; Presyncope [ ] ; Paroxysmal nocturnal dyspnea [ ]    Pulmonary: Cough [ ] ; Wheezing [ ] ; Hemoptysis [ ] ; Sputum [ ] ; Snoring [ ]    GI: Vomiting [ ] ; Dysphagia [ ] ; Melena [ ] ; Hematochezia [ ] ; Heartburn [ ] ; Abdominal pain [ ] ; Constipation [ ] ; Diarrhea [ ] ; BRBPR [ ]    GU: Hematuria [ ] ; Dysuria [ ] ; Nocturia [ ]  Vascular: Pain in legs with walking [ ] ; Pain in feet with lying flat [ ] ; Non-healing sores [ ] ; Stroke [ ] ; TIA [ ] ; Slurred speech [ ] ;   Neuro: Headaches [y]; Vertigo [ ] ; Seizures [ ] ; Paresthesias [ ] ;Blurred vision [ ] ; Diplopia [ ] ; Vision changes [ ]    Ortho/Skin: Arthritis [ ] ; Joint pain [ ] ; Muscle pain [ ] ; Joint swelling [ ] ; Back Pain [ ] ; Rash [ ]    Psych: Depression [ ] ; Anxiety [ ]    Heme: Bleeding problems [ ] ; Clotting disorders [ ] ; Anemia [ ]    Endocrine: Diabetes [ ] ; Thyroid dysfunction [ ]    Physical Exam/Data:   Vitals:   09/10/22 1930 09/10/22 2000 09/10/22 2030 09/10/22 2100  BP: (!) 146/88 (!) 158/84 127/81 (!) 141/85  Pulse: 83 96 87 84  Resp: (!) 21 14 (!) 21 15  Temp:      TempSrc:      SpO2: 93% 93% 92% 91%  Weight:      Height:       No intake or output data in the 24 hours ending 09/10/22 2200    09/10/2022    5:48 PM 05/31/2022  11:36 AM 02/07/2022    1:52 PM  Last 3 Weights  Weight (lbs) 143 lb 144 lb 12.8 oz 143 lb 9.6 oz  Weight (kg) 64.864 kg 65.681 kg 65.137 kg     Body mass index is 26.16 kg/m.  General:  Well nourished, well developed, in no acute distress HEENT: normal Lymph: no adenopathy Neck: no JVD Endocrine:  No thryomegaly Vascular: No carotid bruits; FA pulses 2+ bilaterally without bruits  Cardiac:   normal S1, S2; RRR; no murmur  Lungs:  clear to auscultation w/ decreased breath sounds on right side, no wheezing, rhonchi or rales  Abd: soft, nontender, no hepatomegaly  Ext: no edema Musculoskeletal:  No deformities, BUE and BLE strength normal and equal Skin: warm and dry  Neuro:  CNs 2-12 intact, no focal abnormalities noted Psych:  Normal affect   EKG:  The EKG was personally reviewed and demonstrates:  Normal sinus rhythm with ventricular rate 95 bpm, QRS 91 ms, QTc 402 ms, single PVC Telemetry:  Telemetry was personally reviewed and demonstrates:  normal sinus rhythm, episode of bradycardia with HR down to 37 bpm with longest pause 3.0s  EMS Telemetry Review:  personally reviewed and demonstrates: normal sinus rhtyhm with 2 episodes of bradycardia down to 30s, with longest pause 2.8 seconds  Relevant CV Studies: CT chest reveals calcifications in region of LAD territory  Laboratory Data:  High Sensitivity Troponin:   Recent Labs  Lab 09/10/22 1749 09/10/22 2048  TROPONINIHS 14 14     Chemistry Recent Labs  Lab 09/10/22 1749  NA 143  K 3.3*  CL 101  CO2 28  GLUCOSE 152*  BUN 15  CREATININE 0.90  CALCIUM 9.8  GFRNONAA >60  ANIONGAP 14    Recent Labs  Lab 09/10/22 1749  PROT 6.9  ALBUMIN 4.2  AST 37  ALT 22  ALKPHOS 54  BILITOT 0.9   Hematology Recent Labs  Lab 09/10/22 1749  WBC 6.1  RBC 4.97  HGB 14.2  HCT 42.7  MCV 85.9  MCH 28.6  MCHC 33.3  RDW 13.3  PLT 176   BNPNo results for input(s): "BNP", "PROBNP" in the last 168 hours.  DDimer No results for input(s): "DDIMER" in the last 168 hours.  Radiology/Studies:  DG Chest Port 1 View  Result Date: 09/10/2022 CLINICAL DATA:  Syncope. EXAM: PORTABLE CHEST 1 VIEW COMPARISON:  February 07, 2022 FINDINGS: The heart size and mediastinal contours are within normal limits. There is moderate to marked severity calcification of the aortic arch. Radiopaque surgical clips are seen along the suprahilar  region on the right. Both lungs are clear. Chronic changes are seen involving multiple right ribs. IMPRESSION: No active disease. Electronically Signed   By: Virgina Norfolk M.D.   On: 09/10/2022 18:03   {  Assessment and Plan:   Symptomatic Sinus Bradycardia Syncope and Collapse Presenting following 3 episodes of syncope, of which one was captured on telemetry and reveals progressive P-P prolongation and HR nadir in low 30s. She has also had episodes of symptomatic bradycardia with presyncope, also captured on telemetry revealing sinus bradycardia. ECG is normal elsewise without obvious conduction system disease. Ddx for this symptomatic sinus bradycardia includes vasovagal episodes and sinus node dysfunction. She has no obvious triggers for this new onset of syncope, with no health changes, sickness, or illness. She is on metoprolol chronically for HTN, which should be permanently discontinued given this may be a reversible etiology. Will need short term monitoring given frequent recurrent syncope.  If she continues to have prolonged pauses while off metoprolol, may need to be considered for permanent pacing. -Admit with telemetry -Permanently discontinued metoprolol -Echo  -If recurrent symptomatic pauses in upcoming 24-36 hours, permanent pacemaker may need to be considered -Further plan pending telemetry monitoring  3. Hypertension -Permanently discontinue metoprolol -May need uptitration of other BP meds pending BP of metoprolol  4. Coronary Artery Calcifications Seen on CT imaging in LAD territory -Continue aspirin 81mg       For questions or updates, please contact [facility] Please consult [email or web address] for contact info under     Signed, Bronson Curb, MD  09/10/2022 10:00 PM

## 2022-09-10 NOTE — Assessment & Plan Note (Addendum)
Given KCl x2 for K 3.3, now 3.5. Goal K >4

## 2022-09-11 ENCOUNTER — Encounter (HOSPITAL_COMMUNITY): Payer: Self-pay | Admitting: Family Medicine

## 2022-09-11 ENCOUNTER — Observation Stay (HOSPITAL_BASED_OUTPATIENT_CLINIC_OR_DEPARTMENT_OTHER): Payer: Medicare Other

## 2022-09-11 ENCOUNTER — Inpatient Hospital Stay (HOSPITAL_BASED_OUTPATIENT_CLINIC_OR_DEPARTMENT_OTHER)
Admit: 2022-09-11 | Discharge: 2022-09-11 | Disposition: A | Discharge status: Home / Self Care | Payer: Medicare Other | Attending: Physician Assistant | Admitting: Physician Assistant

## 2022-09-11 DIAGNOSIS — R001 Bradycardia, unspecified: Principal | ICD-10-CM

## 2022-09-11 DIAGNOSIS — E876 Hypokalemia: Secondary | ICD-10-CM

## 2022-09-11 DIAGNOSIS — R55 Syncope and collapse: Secondary | ICD-10-CM | POA: Diagnosis not present

## 2022-09-11 DIAGNOSIS — I10 Essential (primary) hypertension: Secondary | ICD-10-CM | POA: Diagnosis not present

## 2022-09-11 DIAGNOSIS — I2510 Atherosclerotic heart disease of native coronary artery without angina pectoris: Secondary | ICD-10-CM

## 2022-09-11 DIAGNOSIS — I2583 Coronary atherosclerosis due to lipid rich plaque: Secondary | ICD-10-CM | POA: Diagnosis not present

## 2022-09-11 LAB — BASIC METABOLIC PANEL
Anion gap: 8 (ref 5–15)
BUN: 13 mg/dL (ref 8–23)
CO2: 29 mmol/L (ref 22–32)
Calcium: 8.8 mg/dL — ABNORMAL LOW (ref 8.9–10.3)
Chloride: 103 mmol/L (ref 98–111)
Creatinine, Ser: 0.75 mg/dL (ref 0.44–1.00)
GFR, Estimated: >60 mL/min (ref >60)
Glucose, Bld: 105 mg/dL — ABNORMAL HIGH (ref 70–99)
Potassium: 3.5 mmol/L (ref 3.5–5.1)
Sodium: 140 mmol/L (ref 135–145)

## 2022-09-11 LAB — CBC
HCT: 39.2 % (ref 36.0–46.0)
Hemoglobin: 12.5 g/dL (ref 12.0–15.0)
MCH: 28 pg (ref 26.0–34.0)
MCHC: 31.9 g/dL (ref 30.0–36.0)
MCV: 87.9 fL (ref 80.0–100.0)
Platelets: 164 10*3/uL (ref 150–400)
RBC: 4.46 MIL/uL (ref 3.87–5.11)
RDW: 13.4 % (ref 11.5–15.5)
WBC: 6.1 10*3/uL (ref 4.0–10.5)
nRBC: 0 % (ref 0.0–0.2)

## 2022-09-11 LAB — ECHOCARDIOGRAM COMPLETE
AR max vel: 2.37 cm2
AV Area VTI: 2.45 cm2
AV Area mean vel: 2.36 cm2
AV Mean grad: 4 mmHg
AV Peak grad: 7.6 mmHg
Ao pk vel: 1.38 m/s
Area-P 1/2: 5.13 cm2
Height: 62 in
S' Lateral: 2.2 cm
Weight: 2288 oz

## 2022-09-11 LAB — PHOSPHORUS: Phosphorus: 2.1 mg/dL — ABNORMAL LOW (ref 2.5–4.6)

## 2022-09-11 LAB — MAGNESIUM
Magnesium: 6.5 mg/dL (ref 1.7–2.4)
Magnesium: 6.3 mg/dL (ref 1.7–2.4)
Magnesium: 1.7 mg/dL (ref 1.7–2.4)

## 2022-09-11 MED ORDER — MAGNESIUM OXIDE -MG SUPPLEMENT 400 (240 MG) MG PO TABS
400.0000 mg | ORAL_TABLET | Freq: Once | ORAL | Status: AC
  Administered 2022-09-11: 400 mg via ORAL
  Filled 2022-09-11: qty 1

## 2022-09-11 MED ORDER — PERFLUTREN LIPID MICROSPHERE
1.0000 mL | INTRAVENOUS | Status: AC | PRN
  Administered 2022-09-11: 2 mL via INTRAVENOUS

## 2022-09-11 MED ORDER — SODIUM CHLORIDE 0.9 % IV SOLN: INTRAVENOUS | Status: AC

## 2022-09-11 MED ORDER — FUROSEMIDE 10 MG/ML IJ SOLN
40.0000 mg | Freq: Once | INTRAMUSCULAR | Status: AC
  Administered 2022-09-11: 40 mg via INTRAVENOUS
  Filled 2022-09-11: qty 4

## 2022-09-11 NOTE — Evaluation (Signed)
Occupational Therapy Evaluation Patient Details Name: Penny [name] MRN: [id number] DOB: Sep 23, 1933 Today's Date: 09/11/2022   History of Present Illness Penny [name] is a 88 y.o. female admitted 10/8 presenting with multiple episodes of syncope with concern for symptomatic bradycardia.  Cardiology stopping metropolol.  PMH: partial lung removed due to cancer, COPD   Clinical Impression   PTA, pt was living with her daughter and was independent. Currently, pt performing at [place] level for ADLs and functional mobility. Pt presenting with decreased balance and strength compared to baseline; however, not far from baseline function. Pt would benefit from further acute OT to optimize independence and facilitate safe dc.  Recommend dc home once medically stable per physician.   BP supine 150/72 and sitting 142/87. HR randing between 80-100s with Max of 102. SpO2 90s on RA   Recommendations for follow up therapy are one component of a multi-disciplinary discharge planning process, led by the attending physician.  Recommendations may be updated based on patient status, additional functional criteria and insurance authorization.   Follow Up Recommendations  No OT follow up    Assistance Recommended at Discharge PRN  Patient can return home with the following      Functional Status Assessment  Patient has had a recent decline in their functional status and demonstrates the ability to make significant improvements in function in a reasonable and predictable amount of time.  Equipment Recommendations  None recommended by OT    Recommendations for Other Services PT consult     Precautions / Restrictions Precautions Precautions: Fall Restrictions Weight Bearing Restrictions: No      Mobility Bed Mobility Overal bed mobility: Needs Assistance Bed Mobility: Supine to Sit, Sit to Supine     Supine to sit: Supervision Sit to supine: Supervision   General bed  mobility comments: Supervision for safety    Transfers Overall transfer level: Needs assistance   Transfers: Sit to/from Stand Sit to Stand: Min guard, Min assist           General transfer comment: Min A for initial gaining of balance. Min guard A overall      Balance Overall balance assessment: Needs assistance Sitting-balance support: No upper extremity supported, Feet supported Sitting balance-Leahy Scale: Good     Standing balance support: No upper extremity supported, During functional activity Standing balance-Leahy Scale: Fair                             ADL either performed or assessed with clinical judgement   ADL Overall ADL's : Needs assistance/impaired Eating/Feeding: Set up;Sitting   Grooming: Set up;Sitting;Oral care   Upper Body Bathing: Set up;Supervision/ safety;Sitting   Lower Body Bathing: Minimal assistance;Sit to/from stand;Min guard   Upper Body Dressing : Set up;Supervision/safety;Sitting   Lower Body Dressing: Min guard;Minimal assistance;Sit to/from stand   Toilet Transfer: Min guard;Minimal assistance;Ambulation;Rolling walker (2 wheels) (simulated to chair)           Functional mobility during ADLs: Min guard;Rolling walker (2 wheels) General ADL Comments: Pt demonstrating decreased balance and strength compared to her normal. Very motivated. No dizziness. Min Guard-Min A in standing for general balance     Vision Baseline Vision/History: 1 Wears glasses (reading)       Perception     Praxis      Pertinent Vitals/Pain Pain Assessment Pain Assessment: No/denies pain     Hand Dominance Right   Extremity/Trunk Assessment Upper Extremity  Assessment Upper Extremity Assessment: Overall WFL for tasks assessed   Lower Extremity Assessment Lower Extremity Assessment: Defer to PT evaluation   Cervical / Trunk Assessment Cervical / Trunk Assessment: Normal   Communication Communication Communication: No  difficulties   Cognition Arousal/Alertness: Awake/alert Behavior During Therapy: WFL for tasks assessed/performed Overall Cognitive Status: Within Functional Limits for tasks assessed                                       General Comments  BP supine 150/72 and sitting 142/87. HR randing between 80-100s with Max of 102. SpO2 90s on RA    Exercises     Shoulder Instructions      Home Living Family/patient expects to be discharged to:: Private residence Living Arrangements: Children (Daughter) Available Help at Discharge: Family;Available PRN/intermittently Type of Home: House Home Access: Stairs to enter [employer] of Steps: 2 Entrance Stairs-Rails: None Home Layout: Two level;Able to live on main level with bedroom/bathroom               Home Equipment: Cane - single point          Prior Functioning/Environment Prior Level of Function : Independent/Modified Independent;Driving             Mobility Comments: No AD ADLs Comments: ADLs, IADLs, driving.        OT Problem List: Decreased strength;Decreased range of motion;Decreased activity tolerance;Impaired balance (sitting and/or standing);Decreased knowledge of use of DME or AE;Decreased knowledge of precautions      OT Treatment/Interventions: Self-care/ADL training;Therapeutic exercise;Energy conservation;DME and/or AE instruction;Therapeutic activities;Patient/family education;Balance training    OT Goals(Current goals can be found in the care plan section) Acute Rehab OT Goals Patient Stated Goal: Go home OT Goal Formulation: With patient Time For Goal Achievement: 09/25/22 Potential to Achieve Goals: Good  OT Frequency: Min 2X/week    Co-evaluation              AM-PAC OT "6 Clicks" Daily Activity     Outcome Measure Help from another person eating meals?: None Help from another person taking care of personal grooming?: A Little Help from another person toileting,  which includes using toliet, bedpan, or urinal?: A Little Help from another person bathing (including washing, rinsing, drying)?: A Little Help from another person to put on and taking off regular upper body clothing?: A Little Help from another person to put on and taking off regular lower body clothing?: A Little 6 Click Score: 19   End of Session Nurse Communication: Mobility status  Activity Tolerance: Patient tolerated treatment well Patient left: in bed;with call bell/phone within reach  OT Visit Diagnosis: Unsteadiness on feet (R26.81);Other abnormalities of gait and mobility (R26.89);Muscle weakness (generalized) (M62.81)                Time: 4765-4650 OT Time Calculation (min): 19 min Charges:  OT General Charges $OT Visit: 1 Visit OT Evaluation $OT Eval Moderate Complexity: 1 Mod  Syvanna Ciolino MSOT, OTR/L Acute Rehab Office: [place] 09/11/2022, 11:01 AM

## 2022-09-11 NOTE — Evaluation (Signed)
Physical Therapy Evaluation Patient Details Name: Penny [name] MRN: [id number] DOB: [birth year] Today's Date: 09/11/2022  History of Present Illness  Penny [name] is a 88 y.o. female admitted 10/8 presenting with multiple episodes of syncope with concern for symptomatic bradycardia.  Cardiology stopping metropolol.  PMH: partial lung removed due to cancer, COPD  Clinical Impression  Pt admitted with above diagnosis. Pt not orthostatic during session this am.  Did not feel dizzy. Reports feeling a little weak. Limited session as pt tethered to lines and could not progress ambulation. Will most likely need HHPT and should progress well.  Will follow acutely.  Pt currently with functional limitations due to the deficits listed below (see PT Problem List). Pt will benefit from skilled PT to increase their independence and safety with mobility to allow discharge to the venue listed below.           09/11/22 1100  Orthostatic Lying   BP- Lying 120/66  Pulse- Lying 89  Orthostatic Sitting  BP- Sitting 124/88  Pulse- Sitting 92  Orthostatic Standing at 0 minutes  BP- Standing at 0 minutes 127/80  Pulse- Standing at 0 minutes 98  Orthostatic Standing at 3 minutes  BP- Standing at 3 minutes 123/88  Pulse- Standing at 3 minutes 102   Recommendations for follow up therapy are one component of a multi-disciplinary discharge planning process, led by the attending physician.  Recommendations may be updated based on patient status, additional functional criteria and insurance authorization.  Follow Up Recommendations Home health PT      Assistance Recommended at Discharge Set up Supervision/Assistance  Patient can return home with the following  A little help with walking and/or transfers;Assistance with cooking/housework;Assist for transportation;Help with stairs or ramp for entrance    Equipment Recommendations None recommended by PT  Recommendations for Other Services        Functional Status Assessment Patient has had a recent decline in their functional status and demonstrates the ability to make significant improvements in function in a reasonable and predictable amount of time.     Precautions / Restrictions Precautions Precautions: Fall Restrictions Weight Bearing Restrictions: No      Mobility  Bed Mobility Overal bed mobility: Needs Assistance Bed Mobility: Supine to Sit, Sit to Supine     Supine to sit: Supervision Sit to supine: Supervision   General bed mobility comments: Supervision for safety    Transfers Overall transfer level: Needs assistance   Transfers: Sit to/from Stand Sit to Stand: Min guard, Min assist           General transfer comment: Min A for initial gaining of balance. Min guard A overall    Ambulation/Gait Ambulation/Gait assistance: Min guard Gait Distance (Feet): 3 Feet Assistive device: 1 person hand held assist Gait Pattern/deviations: Step-through pattern, Decreased stride length       General Gait Details: limited by lines attached to pt.  Pt not dizzy and did not have orthostatic VS today.  Stairs            Wheelchair Mobility    Modified Rankin (Stroke Patients Only)       Balance Overall balance assessment: Needs assistance Sitting-balance support: No upper extremity supported, Feet supported Sitting balance-Leahy Scale: Good     Standing balance support: No upper extremity supported, During functional activity Standing balance-Leahy Scale: Fair Standing balance comment: No LOB without UE support statically.  Pertinent Vitals/Pain Pain Assessment Pain Assessment: No/denies pain    Home Living Family/patient expects to be discharged to:: Private residence Living Arrangements: Children (Daughter) Available Help at Discharge: Family;Available PRN/intermittently Type of Home: House Home Access: Stairs to enter Entrance Stairs-Rails:  None Entrance Stairs-Number of Steps: 2   Home Layout: Two level;Able to live on main level with bedroom/bathroom Home Equipment: Cane - single point      Prior Function Prior Level of Function : Independent/Modified Independent;Driving             Mobility Comments: No AD ADLs Comments: ADLs, IADLs, driving.     Hand Dominance   Dominant Hand: Right    Extremity/Trunk Assessment   Upper Extremity Assessment Upper Extremity Assessment: Defer to OT evaluation    Lower Extremity Assessment Lower Extremity Assessment: Generalized weakness    Cervical / Trunk Assessment Cervical / Trunk Assessment: Normal  Communication   Communication: No difficulties  Cognition Arousal/Alertness: Awake/alert Behavior During Therapy: WFL for tasks assessed/performed Overall Cognitive Status: Within Functional Limits for tasks assessed                                          General Comments General comments (skin integrity, edema, etc.): BP supine 150/72 and sitting 142/87. HR randing between 80-100s with Max of 102. SpO2 90s on RA    Exercises     Assessment/Plan    PT Assessment Patient needs continued PT services  PT Problem List Decreased activity tolerance;Decreased balance;Decreased mobility       PT Treatment Interventions DME instruction;Gait training;Functional mobility training;Therapeutic activities;Therapeutic exercise;Balance training;Stair training;Patient/family education    PT Goals (Current goals can be found in the Care Plan section)  Acute Rehab PT Goals Patient Stated Goal: to go home PT Goal Formulation: With patient Time For Goal Achievement: 09/25/22 Potential to Achieve Goals: Good    Frequency Min 3X/week     Co-evaluation               AM-PAC PT "6 Clicks" Mobility  Outcome Measure Help needed turning from your back to your side while in a flat bed without using bedrails?: None Help needed moving from lying on your  back to sitting on the side of a flat bed without using bedrails?: None Help needed moving to and from a bed to a chair (including a wheelchair)?: A Little Help needed standing up from a chair using your arms (e.g., wheelchair or bedside chair)?: A Little Help needed to walk in hospital room?: A Little Help needed climbing 3-5 steps with a railing? : A Lot 6 Click Score: 19    End of Session Equipment Utilized During Treatment: Gait belt Activity Tolerance: Patient tolerated treatment well Patient left: in bed;with call bell/phone within reach (on stretcher) Nurse Communication: Mobility status PT Visit Diagnosis: Muscle weakness (generalized) (M62.81)    Time: 7353-2992 PT Time Calculation (min) (ACUTE ONLY): 16 min   Charges:   PT Evaluation $PT Eval Moderate Complexity: 1 Mod          Ambers Iyengar M,PT Acute Rehab Services [phone]   Alvira Philips 09/11/2022, 11:20 AM

## 2022-09-11 NOTE — ED Notes (Signed)
Pt resting comfortably at this time. Visible rise and fall of chest noted. Respirations even, unlabored. Pt appears in NAD. VS WNL.

## 2022-09-11 NOTE — ED Notes (Signed)
Patient stated she couldn't use the purewick that was placed. Patient was encouraged to try. Patient was advised that due to her recent syncope and her bradycardia that I would not allow her to get out of the bed even if other staff had throughout the evening. Patient used her purewick and was successful with a dry bed. Patient stated she could not sleep with it in place. Per patient request purewick was removed. Patient was placed on a brief and given two more warm blankets.

## 2022-09-11 NOTE — Progress Notes (Signed)
Rounding Note    Patient Name: Penny [name] Date of Encounter: 09/11/2022  [place] Cardiologist: Buford Dresser, MD   Subjective   No acute events overnight. Has not had recurrent symptoms, but has also not been out of bed. Eating breakfast this AM.  Inpatient Medications    Scheduled Meds:  aspirin  81 mg Oral Daily   cholecalciferol  5,000 Units Oral Daily   cyanocobalamin  1,000 mcg Oral Daily   loratadine  10 mg Oral Daily   montelukast  10 mg Oral QHS   potassium chloride  40 mEq Oral BID   simvastatin  40 mg Oral q1800   sodium chloride flush  3 mL Intravenous Q12H   Continuous Infusions:  sodium chloride 125 mL/hr at 09/11/22 0549   PRN Meds:    Vital Signs    Vitals:   09/11/22 0800 09/11/22 0830 09/11/22 0900 09/11/22 0930  BP: 136/75 (!) 150/72 131/84 123/78  Pulse: 73 79 87 88  Resp: 14 19 13 18   Temp:      TempSrc:      SpO2: 98% 97% 92% 95%  Weight:      Height:        Intake/Output Summary (Last 24 hours) at 09/11/2022 1022 Last data filed at 09/10/2022 2337 Gross per 24 hour  Intake 47.24 ml  Output --  Net 47.24 ml      09/10/2022    5:48 PM 05/31/2022   11:36 AM 02/07/2022    1:52 PM  Last 3 Weights  Weight (lbs) 143 lb 144 lb 12.8 oz 143 lb 9.6 oz  Weight (kg) 64.864 kg 65.681 kg 65.137 kg      Telemetry    NSR, no pauses >3 sec, lowest rate on telemetry was sinus brady in the 30s - Personally Reviewed  ECG    SR at 65 - Personally Reviewed  Physical Exam   GEN: No acute distress.   Neck: No JVD Cardiac: RRR, no murmurs, rubs, or gallops.  Respiratory: Clear to auscultation bilaterally. GI: Soft, nontender, non-distended  MS: No edema; No deformity. Neuro:  Nonfocal  Psych: Normal affect   Labs    High Sensitivity Troponin:   Recent Labs  Lab 09/10/22 1749 09/10/22 2048  TROPONINIHS 14 14     Chemistry Recent Labs  Lab 09/10/22 1749 09/11/22 0318 09/11/22 0432  NA 143 140  --    K 3.3* 3.5  --   CL 101 103  --   CO2 28 29  --   GLUCOSE 152* 105*  --   BUN 15 13  --   CREATININE 0.90 0.75  --   CALCIUM 9.8 8.8*  --   MG 1.6* 6.5* 6.3*  PROT 6.9  --   --   ALBUMIN 4.2  --   --   AST 37  --   --   ALT 22  --   --   ALKPHOS 54  --   --   BILITOT 0.9  --   --   GFRNONAA >60 >60  --   ANIONGAP 14 8  --     Lipids No results for input(s): "CHOL", "TRIG", "HDL", "LABVLDL", "LDLCALC", "CHOLHDL" in the last 168 hours.  Hematology Recent Labs  Lab 09/10/22 1749 09/11/22 0318  WBC 6.1 6.1  RBC 4.97 4.46  HGB 14.2 12.5  HCT 42.7 39.2  MCV 85.9 87.9  MCH 28.6 28.0  MCHC 33.3 31.9  RDW 13.3 13.4  PLT  176 164   Thyroid  Recent Labs  Lab 09/10/22 1800  TSH 1.738    BNPNo results for input(s): "BNP", "PROBNP" in the last 168 hours.  DDimer No results for input(s): "DDIMER" in the last 168 hours.   Radiology    DG Chest Port 1 View  Result Date: 09/10/2022 CLINICAL DATA:  Syncope. EXAM: PORTABLE CHEST 1 VIEW COMPARISON:  February 07, 2022 FINDINGS: The heart size and mediastinal contours are within normal limits. There is moderate to marked severity calcification of the aortic arch. Radiopaque surgical clips are seen along the suprahilar region on the right. Both lungs are clear. Chronic changes are seen involving multiple right ribs. IMPRESSION: No active disease. Electronically Signed   By: Virgina Norfolk M.D.   On: 09/10/2022 18:03    Cardiac Studies   Echo pending, see below  Patient Profile     88 y.o. female presenting with recurrent syncope, sinus bradycardia on metoprolol.  Assessment & Plan    Syncope Drug induced sinus bradycardia -stopping metoprolol permanently -discussed with EP. Has had no significant pauses while here. Recommend permanent discontinuation of nodal agents, placing live monitor at discharge -I personally reviewed echo. Difficult images, even with echo contrast, but appears grossly normal.  -would encourage ambulation  on telemetry prior to discharge to make sure she does not have symptoms -she has received IV fluids and is feeling much better. Question if she may have had vagal process from dehydration  Hypertension -currently well controlled off of metoprolol  Coronary calcification -at age 79, unclear significance of this.   [place] will sign off.   Medication Recommendations:  Remain off of metoprolol Other recommendations (labs, testing, etc):  live monitor at discharge Follow up as an outpatient:  We will arrange for outpatient follow up  For questions or updates, please contact [place] Please consult [email or web address] for contact info under        Signed, Buford Dresser, MD  09/11/2022, 10:22 AM

## 2022-09-11 NOTE — Care Management Obs Status (Signed)
[place] NOTIFICATION   Patient Details  Name: Penny [name] MRN: [id number] Date of Birth: 1933-04-30   Medicare Observation Status Notification Given:  Yes    Bethena Roys, RN 09/11/2022, 4:34 PM

## 2022-09-11 NOTE — Progress Notes (Signed)
FMTS Interim Progress Note  S: In to check on patient after Mg level was elevated. No concerns per patient at this time. She was able to get some sleep and feels more rested now. She feels about the same as before and has not noted any other episodes of bradycardia/syncope. Denies pain, CP/SOB, n/v, headache, vision change, muscle weakness. Was placed on 1.5L [place] due to single desat episode to upper 80s while asleep.  O: BP (!) 148/83   Pulse 78   Temp (!) 97.5 F (36.4 C) (Oral)   Resp 14   Ht 5\' 2"  (1.575 m)   Wt 64.9 kg   SpO2 91%   BMI 26.16 kg/m    Gen: Alert, conversational, NAD CV: RRR no MRG Pulm: CTAB, normal WOB on 1.5L [place] Neuro: No focal deficits noted, patellar and brachioradialis reflexes 1-2+ and equal b/l, sensation intact in all extremities, 5/5 strength in all extremities  A/P:  Hypermagnesemia  Unclear etiology given normal renal function. Was previously low at 1.6 and given 2g IV MgSO4 which unexpectedly brought it up to >6. Neurologic and cardiopulmonary exam remain reassuringly benign. -IV Lasix 40mg  x1 -NS IVF at 164mL/hr  COPD -Goal O2 sat 88-92% given COPD, wean off O2 as tolerated   August Albino, MD 09/11/2022, 6:08 AM PGY-1, [place] Medicine Service pager [phone]

## 2022-09-11 NOTE — ED Notes (Signed)
Echo at bedside

## 2022-09-11 NOTE — Discharge Instructions (Addendum)
Dear Penny [name],   Thank you so much for allowing [place] to be part of your care!  You were admitted to [facility] for low heart rates and having these spells.   POST-HOSPITAL & CARE INSTRUCTIONS STOP metoprolol Follow up with cardiology, they will have you on a live monitor Please let PCP/Specialists know of any changes that were made.  Please see medications section of this packet for any medication changes.   DOCTOR'S APPOINTMENT & FOLLOW UP CARE INSTRUCTIONS  Future Appointments  Date Time Provider [place]  09/11/2022 11:00 AM [facility]-EKG MONITOR MC-EKG [facility]  09/13/2022  9:30 AM Unk Pinto, MD [facility]-GAA[facility] None  09/19/2022 12:30 PM GI-BCG MM 3 GI-BCGMM GI-BREAST CE  10/09/2022  9:15 AM Loel Dubonnet, NP [facility]-CVD [facility]  02/08/2023  2:00 PM Alycia Rossetti, NP [facility]AM-GAAIM None    RETURN PRECAUTIONS: Chest pain, loss of conciousness  Take care and be well!  [facility]  [place], [place] [address] [phone]

## 2022-09-11 NOTE — ED Notes (Signed)
Verbal report given to Betsey Holiday EMT-P at this time

## 2022-09-11 NOTE — Progress Notes (Signed)
Daily Progress Note Intern Pager: [phone]  Patient name: Penny [name] Medical record number: [id number] Date of birth: 19-Apr-1933 Age: 88 y.o. Gender: female  Primary Care Provider: Unk Pinto, MD Consultants: Cardiology Code Status: FULL CODE  Pt Overview and Major Events to Date:  10/9 Mg 1.6, given 2g>6.5>6.3   Assessment and Plan: Penny [name] is a 88 y.o. female presenting with multiple episodes of syncope with concern for symptomatic bradycardia.  Differential diagnoses and management as below. Pertinent PMH/PSH includes  COPD, right lung cancer status post wedge resection, aortic atherosclerosis, hypertension, hyperlipidemia.    * Symptomatic sinus bradycardia Patient seen this morning, denies CP/SOB or N/V with her episodes, with normal troponin and no ischemic changes on EKG to suggest ACS. Neurologic exam without any focal deficits.  On admission, vitals stable and patient asymptomatic with benign cardiopulmonary exam. However, given frequency of these episodes would benefit from further monitoring and cardiac workup. - Cardiology following, appreciate recs - Zio montior ordered, to be placed at discharge and follow up with Cardiology.  - Continuous telemetry  -Metoprolol permanently discontinued - Echocardiogram unremarkable, LVEF 60-65% - Orthostatics - Ambulate w/ telemetry prior to discharge - PT/OT - if recurrent episodes of symptomatic bradycardia, may consider pacemaker vs atropine  CAD (coronary artery disease) Coronary calcifications seen on previous CT imaging. -Continue 81 mg ASA, simvastatin daily   Hypomagnesemia Given IV Mg sulfate 2g for Mg 1.6. Goal Mg >2, decreased from 6.5 to 6.3  Hypokalemia Given KCl 1meq x2 for K 3.3, now 3.5. Goal K >4  Essential hypertension BP 140-150s/70-80. On Metoprolol and HCTZ at home. Metoprolol permanently discontinued. - Cardiology cleared her, no acute findings on echo.  -Will hold home  medications for now -Vitals per floor routine    FEN/GI: Heart healthy diet PPx: Lovenox 40 mg injection Dispo:Pending clinical improvement  Subjective:  Patient evaluated at bedside this AM, describes syncopal episodes as warmth that warns that starts in her abdomen and a minute later she losses consciousness, endorses the urinary incontinence describes in prior notes. She has had 3 prior episodes, the first on Friday. Feels fine this morning. Denies lightheadedness, weakness, nausea or vomiting this morning. Only notes some weakness from "laying here all night".  Pharmacy went to see her and confirm patient had been adherent to metoprolol prior to admission.  Patient lives at home with daughter.   Objective: Temp:  [97.5 F (36.4 C)-98.7 F (37.1 C)] 97.7 F (36.5 C) (10/09 1255) Pulse Rate:  [69-104] 89 (10/09 1255) Resp:  [12-21] 18 (10/09 1255) BP: (107-158)/(61-95) 126/74 (10/09 1255) SpO2:  [90 %-100 %] 95 % (10/09 1255) Weight:  [64.9 kg] 64.9 kg (10/08 1748) Physical Exam: General: NAD, awake and alert, conversational and lighthearted Cardiovascular: RRR no MRG Respiratory: CTAB normal WOB on RA Gastrointestinal: Soft, NT/ND, normal BS MSK: 5/5 strength upper and lower extremities Derm: No bruises noted, normal skin thinning with age  Neuro: No focal deficits noted, speech is clear, AxO x4, able to follow commands, 5/5 strength Psych: Normal mentation  Laboratory: Most recent CBC Lab Results  Component Value Date   WBC 6.1 09/11/2022   HGB 12.5 09/11/2022   HCT 39.2 09/11/2022   MCV 87.9 09/11/2022   PLT 164 09/11/2022   Most recent BMP    Latest Ref Rng & Units 09/11/2022    3:18 AM  BMP  Glucose 70 - 99 mg/dL 105   BUN 8 - 23 mg/dL 13   Creatinine  0.44 - 1.00 mg/dL 0.75   Sodium 135 - 145 mmol/L 140   Potassium 3.5 - 5.1 mmol/L 3.5   Chloride 98 - 111 mmol/L 103   CO2 22 - 32 mmol/L 29   Calcium 8.9 - 10.3 mg/dL 8.8    Other pertinent labs:   Troponin  14 TSH 1.738 Mg 1.6>6.5>6.3  EKG: NSR. Rhythm strips in the room with NSR and brief episodes of sinus bradycardia  Imaging/Diagnostic Tests: CXR IMPRESSION: No active disease.   Christene Slates, MD 09/11/2022, 3:47 PM  PGY-1, [place] Intern pager: [phone], text pages welcome Secure chat group [place]

## 2022-09-11 NOTE — ED Notes (Signed)
Patient oxygen dropped to 87% with a good pleth on the monitor while sleeping. Patient was placed on 1.5 LPM nasal cannula and came up to 95%.

## 2022-09-11 NOTE — ED Notes (Signed)
ED TO INPATIENT HANDOFF REPORT  ED Nurse Name and Phone #: Jeannie Done [place] Name/Age/Gender Penny [name] 88 y.o. female Room/Bed: 009C/009C  Code Status   Code Status: Full Code  Home/SNF/Other Home Patient oriented to: self, place, time, and situation Is this baseline? Yes   Triage Complete: Triage complete  Chief Complaint Bradycardia [R00.1]  Triage Note Pt BIB [facility] coming from home. Pt with 3 reported episodes of syncope over the weekend. Today's episode was witnessed by family and lasted approx 2 minutes. EMS noted pt had a transient episode of bradycardia in the 20's.During transport pt had 2 episodes of near syncope with bradycardia.    Allergies Allergies  Allergen Reactions   Ace Inhibitors Cough   Calcium-Containing Compounds Other (See Comments)    constpation   Lactose Intolerance (Gi) Diarrhea   Polysporin [Bacitracin-Polymyxin B] Rash    Polysporin or neosporin gave her a rash, couldn't remember which    Level of Care/Admitting Diagnosis ED Disposition     ED Disposition  Admit   Condition  --   [facility] Area: [place] [[id number]]  Level of Care: Telemetry Medical [104]  May place patient in observation at [facility] or [place] if equivalent level of care is available:: No  Covid Evaluation: Asymptomatic - no recent exposure (last 10 days) testing not required  Diagnosis: Bradycardia [878676]  Admitting Physician: August Albino [[id number]]  Attending Physician: Zenia Resides [[id number]]          B Medical/Surgery History Past Medical History:  Diagnosis Date   COPD (chronic obstructive pulmonary disease) with emphysema ([place])    COVID-19 12/09/2019   Hyperlipidemia    Hypertension    Lung cancer, upper lobe ([place]) 12/04/1996   RUL   Osteoporosis    S/P partial lobectomy of lung    [id number]   Thigh shingles 12/04/1996   Thoracic aorta atherosclerosis ([place])    Vitamin D deficiency    Past Surgical  History:  Procedure Laterality Date   CARPAL TUNNEL RELEASE Right    2009   LUNG REMOVAL, PARTIAL Right    RUL 1998   OLECRANON BURSECTOMY Left 03/20/2017   Procedure: EXICISION OLECRANON BURSA LEFT ELBOW;  Surgeon: Daryll Brod, MD;  Location: [facility];  Service: Orthopedics;  Laterality: Left;  with scb block in preop   SHOULDER ADHESION RELEASE Right    2004     A IV Location/Drains/Wounds Patient Lines/Drains/Airways Status     Active Line/Drains/Airways     Name Placement date Placement time Site Days   Peripheral IV 09/10/22 20 G Left Antecubital 09/10/22  1745  Antecubital  1   External Urinary Catheter 09/11/22  0028  --  less than 1   Incision (Closed) 03/20/17 Elbow Left 03/20/17  0907  -- 2001   Wound / Incision (Open or Dehisced) 10/20/14 Laceration Head Right;Posterior 10/20/14  2014  Head  2883            Intake/Output Last 24 hours  Intake/Output Summary (Last 24 hours) at 09/11/2022 0951 Last data filed at 09/10/2022 2337 Gross per 24 hour  Intake 47.24 ml  Output --  Net 47.24 ml    Labs/Imaging Results for orders placed or performed during the hospital encounter of 09/10/22 (from the past 48 hour(s))  CBC with Differential/Platelet     Status: None   Collection Time: 09/10/22  5:49 PM  Result Value Ref Range   WBC 6.1 4.0 - 10.5  K/uL   RBC 4.97 3.87 - 5.11 MIL/uL   Hemoglobin 14.2 12.0 - 15.0 g/dL   HCT 42.7 36.0 - 46.0 %   MCV 85.9 80.0 - 100.0 fL   MCH 28.6 26.0 - 34.0 pg   MCHC 33.3 30.0 - 36.0 g/dL   RDW 13.3 11.5 - 15.5 %   Platelets 176 150 - 400 K/uL   nRBC 0.0 0.0 - 0.2 %   Neutrophils Relative % 75 %   Neutro Abs 4.6 1.7 - 7.7 K/uL   Lymphocytes Relative 16 %   Lymphs Abs 1.0 0.7 - 4.0 K/uL   Monocytes Relative 8 %   Monocytes Absolute 0.5 0.1 - 1.0 K/uL   Eosinophils Relative 0 %   Eosinophils Absolute 0.0 0.0 - 0.5 K/uL   Basophils Relative 1 %   Basophils Absolute 0.0 0.0 - 0.1 K/uL   Immature Granulocytes 0 %    Abs Immature Granulocytes 0.02 0.00 - 0.07 K/uL    Comment: Performed at [facility] [address] [place], [place] [address]  Comprehensive metabolic panel     Status: Abnormal   Collection Time: 09/10/22  5:49 PM  Result Value Ref Range   Sodium 143 135 - 145 mmol/L   Potassium 3.3 (L) 3.5 - 5.1 mmol/L    Comment: HEMOLYSIS AT THIS LEVEL MAY AFFECT RESULT   Chloride 101 98 - 111 mmol/L   CO2 28 22 - 32 mmol/L   Glucose, Bld 152 (H) 70 - 99 mg/dL    Comment: Glucose reference range applies only to samples taken after fasting for at least 8 hours.   BUN 15 8 - 23 mg/dL   Creatinine, Ser 0.90 0.44 - 1.00 mg/dL   Calcium 9.8 8.9 - 10.3 mg/dL   Total Protein 6.9 6.5 - 8.1 g/dL   Albumin 4.2 3.5 - 5.0 g/dL   AST 37 15 - 41 U/L    Comment: HEMOLYSIS AT THIS LEVEL MAY AFFECT RESULT   ALT 22 0 - 44 U/L    Comment: HEMOLYSIS AT THIS LEVEL MAY AFFECT RESULT   Alkaline Phosphatase 54 38 - 126 U/L   Total Bilirubin 0.9 0.3 - 1.2 mg/dL    Comment: HEMOLYSIS AT THIS LEVEL MAY AFFECT RESULT   GFR, Estimated >60 >60 mL/min    Comment: (NOTE) Calculated using the CKD-EPI Creatinine Equation (2021)    Anion gap 14 5 - 15    Comment: Performed at [facility], [place] [address], [place], [place] [address]  Troponin I (High Sensitivity)     Status: None   Collection Time: 09/10/22  5:49 PM  Result Value Ref Range   Troponin I (High Sensitivity) 14 <18 ng/L    Comment: (NOTE) Elevated high sensitivity troponin I (hsTnI) values and significant  changes across serial measurements may suggest ACS but many other  chronic and acute conditions are known to elevate hsTnI results.  Refer to the "Links" section for chest pain algorithms and additional  guidance. Performed at [facility], [place] [address] [place], [place] [address]   Magnesium     Status: Abnormal   Collection Time: 09/10/22  5:49 PM  Result Value Ref Range   Magnesium 1.6 (L) 1.7 - 2.4 mg/dL    Comment:  Performed at [facility] [address], [place], [place] [address]  TSH     Status: None   Collection Time: 09/10/22  6:00 PM  Result Value Ref Range  TSH 1.738 0.350 - 4.500 uIU/mL    Comment: Performed by a 3rd Generation assay with a functional sensitivity of <=0.01 uIU/mL. Performed at [facility], [place] [address], [place], [place] [address]   Troponin I (High Sensitivity)     Status: None   Collection Time: 09/10/22  8:48 PM  Result Value Ref Range   Troponin I (High Sensitivity) 14 <18 ng/L    Comment: (NOTE) Elevated high sensitivity troponin I (hsTnI) values and significant  changes across serial measurements may suggest ACS but many other  chronic and acute conditions are known to elevate hsTnI results.  Refer to the "Links" section for chest pain algorithms and additional  guidance. Performed at [facility], [place] [address], [place], [place] [address]   Basic metabolic panel     Status: Abnormal   Collection Time: 09/11/22  3:18 AM  Result Value Ref Range   Sodium 140 135 - 145 mmol/L   Potassium 3.5 3.5 - 5.1 mmol/L   Chloride 103 98 - 111 mmol/L   CO2 29 22 - 32 mmol/L   Glucose, Bld 105 (H) 70 - 99 mg/dL    Comment: Glucose reference range applies only to samples taken after fasting for at least 8 hours.   BUN 13 8 - 23 mg/dL   Creatinine, Ser 0.75 0.44 - 1.00 mg/dL   Calcium 8.8 (L) 8.9 - 10.3 mg/dL   GFR, Estimated >60 >60 mL/min    Comment: (NOTE) Calculated using the CKD-EPI Creatinine Equation (2021)    Anion gap 8 5 - 15    Comment: Performed at [facility] [address] [place] [address]  CBC     Status: None   Collection Time: 09/11/22  3:18 AM  Result Value Ref Range   WBC 6.1 4.0 - 10.5 K/uL   RBC 4.46 3.87 - 5.11 MIL/uL   Hemoglobin 12.5 12.0 - 15.0 g/dL   HCT 39.2 36.0 - 46.0 %   MCV 87.9 80.0 - 100.0 fL   MCH 28.0 26.0 - 34.0 pg   MCHC 31.9 30.0 - 36.0 g/dL   RDW 13.4 11.5 - 15.5 %   Platelets 164 150  - 400 K/uL   nRBC 0.0 0.0 - 0.2 %    Comment: Performed at [facility], [place] [address], [place], [place] [address]  Magnesium     Status: Abnormal   Collection Time: 09/11/22  3:18 AM  Result Value Ref Range   Magnesium 6.5 (HH) 1.7 - 2.4 mg/dL    Comment: CRITICAL RESULT CALLED TO, READ BACK BY AND VERIFIED WITH A. [place], [place], [place] 09/11/22, A. RAMSEY Performed at [facility], [place] [address], [place], [place] [address]   Phosphorus     Status: Abnormal   Collection Time: 09/11/22  3:18 AM  Result Value Ref Range   Phosphorus 2.1 (L) 2.5 - 4.6 mg/dL    Comment: Performed at [facility] [address] [place], [place] [address]  Magnesium     Status: Abnormal   Collection Time: 09/11/22  4:32 AM  Result Value Ref Range   Magnesium 6.3 (HH) 1.7 - 2.4 mg/dL    Comment: CRITICAL RESULT CALLED TO, READ BACK BY AND VERIFIED WITH A. [place], [place], [place] 09/11/22, A. RAMSEY Performed at [facility], [address] [place],  [address]    DG Chest Port 1 View  Result Date: 09/10/2022 CLINICAL DATA:  Syncope.  EXAM: PORTABLE CHEST 1 VIEW COMPARISON:  February 07, 2022 FINDINGS: The heart size and mediastinal contours are within normal limits. There is moderate to marked severity calcification of the aortic arch. Radiopaque surgical clips are seen along the suprahilar region on the right. Both lungs are clear. Chronic changes are seen involving multiple right ribs. IMPRESSION: No active disease. Electronically Signed   By: Virgina Norfolk M.D.   On: 09/10/2022 18:03    Pending Labs Unresulted Labs (From admission, onward)     Start     Ordered   09/12/22 6283  Basic metabolic panel  Tomorrow morning,   R        09/11/22 0915   09/11/22 1000  Magnesium  Once,   R        09/11/22 0641            Vitals/Pain Today's Vitals   09/11/22 0800 09/11/22 0830 09/11/22 0900 09/11/22 0930  BP: 136/75 (!) 150/72 131/84 123/78  Pulse: 73 79 87 88  Resp: 14 19  13 18   Temp:      TempSrc:      SpO2: 98% 97% 92% 95%  Weight:      Height:      PainSc:        Isolation Precautions No active isolations  Medications Medications  aspirin chewable tablet 81 mg (has no administration in time range)  simvastatin (ZOCOR) tablet 40 mg (has no administration in time range)  cyanocobalamin (VITAMIN B12) tablet 1,000 mcg (has no administration in time range)  cholecalciferol (VITAMIN D3) 25 MCG (1000 UNIT) tablet 5,000 Units (has no administration in time range)  loratadine (CLARITIN) tablet 10 mg (has no administration in time range)  montelukast (SINGULAIR) tablet 10 mg (10 mg Oral Given 09/10/22 2230)  sodium chloride flush (NS) 0.9 % injection 3 mL (3 mLs Intravenous Not Given 09/10/22 2232)  potassium chloride (KLOR-CON) packet 40 mEq (40 mEq Oral Given 09/10/22 2230)  0.9 %  sodium chloride infusion ( Intravenous New Bag/Given 09/11/22 0549)  perflutren lipid microspheres (DEFINITY) IV suspension (2 mLs Intravenous Given 09/11/22 0941)  magnesium sulfate IVPB 2 g 50 mL (0 g Intravenous Stopped 09/10/22 2337)  loratadine (CLARITIN) tablet 10 mg (10 mg Oral Given 09/11/22 0017)  furosemide (LASIX) injection 40 mg (40 mg Intravenous Given 09/11/22 0553)    Mobility walks with person assist High fall risk   Focused Assessments Cardiac Assessment Handoff:  Cardiac Rhythm: Normal sinus rhythm No results found for: "CKTOTAL", "CKMB", "CKMBINDEX", "TROPONINI" Lab Results  Component Value Date   DDIMER 3.88 (H) 12/09/2019   Does the Patient currently have chest pain? No    R Recommendations: See Admitting Provider Note  Report given to:   Additional Notes: on a purewick, episodes of bradycardia down to 20s, none on this shift.

## 2022-09-11 NOTE — Progress Notes (Signed)
   09/11/22 1100  Orthostatic Lying   BP- Lying 120/66  Pulse- Lying 89  Orthostatic Sitting  BP- Sitting 124/88  Pulse- Sitting 92  Orthostatic Standing at 0 minutes  BP- Standing at 0 minutes 127/80  Pulse- Standing at 0 minutes 98  Orthostatic Standing at 3 minutes  BP- Standing at 3 minutes 123/88  Pulse- Standing at 3 minutes 102   Orthostatic VS taken during PT evaluation. Nashalie Sallis M,PT Acute Rehab Services [phone]

## 2022-09-11 NOTE — Care Management (Signed)
  Transition of Care [facility]) Screening Note   Patient Details  Name: Penny [name] Date of Birth: [birth year]   [facility]) CM/SW Contact:    Bethena Roys, RN Phone Number: 09/11/2022, 4:34 PM    Transition of Care Department [facility]) has reviewed the patient and no TOC needs have been identified at this time. Case Manager spoke with patient regarding home health services and patient declined services. Patient states she lives wit her daughter and has a rolling walker at home. Case Manager will continue to monitor patient advancement through interdisciplinary progression rounds.

## 2022-09-11 NOTE — Progress Notes (Signed)
Per Dr. Judeth Cornfield request, 14 day Zio monitor has been ordered to be placed prior to discharge - notified the heart/vascular team. Also arranged f/u. She communicated this update to the primary team.

## 2022-09-12 DIAGNOSIS — R001 Bradycardia, unspecified: Secondary | ICD-10-CM | POA: Diagnosis not present

## 2022-09-12 DIAGNOSIS — R55 Syncope and collapse: Secondary | ICD-10-CM | POA: Diagnosis not present

## 2022-09-12 DIAGNOSIS — I2510 Atherosclerotic heart disease of native coronary artery without angina pectoris: Secondary | ICD-10-CM | POA: Diagnosis not present

## 2022-09-12 DIAGNOSIS — I10 Essential (primary) hypertension: Secondary | ICD-10-CM | POA: Diagnosis not present

## 2022-09-12 DIAGNOSIS — E876 Hypokalemia: Secondary | ICD-10-CM | POA: Diagnosis not present

## 2022-09-12 LAB — BASIC METABOLIC PANEL
Anion gap: 9 (ref 5–15)
BUN: 19 mg/dL (ref 8–23)
CO2: 30 mmol/L (ref 22–32)
Calcium: 8.9 mg/dL (ref 8.9–10.3)
Chloride: 101 mmol/L (ref 98–111)
Creatinine, Ser: 0.85 mg/dL (ref 0.44–1.00)
GFR, Estimated: >60 mL/min (ref >60)
Glucose, Bld: 117 mg/dL — ABNORMAL HIGH (ref 70–99)
Potassium: 3.5 mmol/L (ref 3.5–5.1)
Sodium: 140 mmol/L (ref 135–145)

## 2022-09-12 LAB — MAGNESIUM: Magnesium: 1.9 mg/dL (ref 1.7–2.4)

## 2022-09-12 MED ORDER — POTASSIUM CHLORIDE 20 MEQ PO PACK
40.0000 meq | PACK | Freq: Once | ORAL | Status: AC
  Administered 2022-09-12: 40 meq via ORAL
  Filled 2022-09-12: qty 2

## 2022-09-12 NOTE — Progress Notes (Signed)
Daily Progress Note Intern Pager: [phone]  Patient name: Penny [name] Medical record number: [id number] Date of birth: 1933-05-19 Age: 88 y.o. Gender: female  Primary Care Provider: Unk Pinto, MD Consultants: Cardiology Code Status: FULL CODE  Pt Overview and Major Events to Date:  10/9 Mg 1.6, given 2g>6.5>6.3   Assessment and Plan: Brylei Pedley is a 88 y.o. female presenting with multiple episodes of syncope with concern for symptomatic bradycardia.  Differential diagnoses and management as below. Pertinent PMH/PSH includes  COPD, right lung cancer status post wedge resection, aortic atherosclerosis, hypertension, hyperlipidemia.    * Symptomatic sinus bradycardia Patient seen this morning, denies CP/SOB or N/V with her episodes, with normal troponin and no ischemic changes on EKG to suggest ACS. Neurologic exam without any focal deficits.  On admission, vitals stable and patient asymptomatic with benign cardiopulmonary exam. However, given frequency of these episodes would benefit from further monitoring and cardiac workup. - Cardiology following, appreciate recs - Zio montior ordered, to be placed at discharge and follow up with Cardiology.  - Continuous telemetry  -Metoprolol permanently discontinued - Echocardiogram unremarkable, LVEF 60-65% - Orthostatics - Ambulate w/ telemetry prior to discharge - PT/OT - if recurrent episodes of symptomatic bradycardia, may consider pacemaker vs atropine  CAD (coronary artery disease) Coronary calcifications seen on previous CT imaging. -Continue 81 mg ASA, simvastatin daily   Hypomagnesemia Given IV Mg sulfate 2g for Mg 1.6. Goal Mg >2, decreased from 6.5 to 6.3  Hypokalemia Given KCl 69meq x2 for K 3.3, now 3.5. Goal K >4  Essential hypertension BP 140-150s/70-80. On Metoprolol and HCTZ at home. Metoprolol permanently discontinued. - Cardiology cleared her, no acute findings on echo.  -Will hold home  medications for now -Vitals per floor routine   FEN/GI: Heart healthy diet PPx: Lovenox 40 mg injection Dispo:Pending clinical improvement  Subjective:  Patient evaluated at bedside this AM, reports feeling well. No acute complaints, denies any dizziness, nausea, or syncopal episodes.  Per RN, patient had shortness of breath and weakness when ambulating, also noting that her heart rate increased to 113.  Requested that PT evaluate patient prior to discharging, patient initially declined using RW and requested to stay an additional night, more willing to consider RW and HHPT after discussing this later in the afternoon.  We will also repeat orthostatics prior to discharging tomorrow.  Objective: Temp:  [97.8 F (36.6 C)-98.2 F (36.8 C)] 98.2 F (36.8 C) (10/10 0435) Pulse Rate:  [77-84] 84 (10/10 0435) Resp:  [16] 16 (10/10 0435) BP: (124-144)/(64-96) 129/96 (10/10 0435) SpO2:  [93 %-96 %] 93 % (10/10 0435) Physical Exam: General: NAD, awake and alert, conversational and lighthearted Cardiovascular: RRR no MRG Respiratory: CTAB normal WOB on RA Gastrointestinal: Soft, NT/ND, normal BS MSK: 5/5 strength upper and lower extremities Derm: No bruises noted, normal skin thinning with age  Neuro: No focal deficits noted, speech is clear, AxO x4, able to follow commands, 5/5 strength Psych: Normal mentation  Laboratory: Most recent CBC Lab Results  Component Value Date   WBC 6.1 09/11/2022   HGB 12.5 09/11/2022   HCT 39.2 09/11/2022   MCV 87.9 09/11/2022   PLT 164 09/11/2022   Most recent BMP    Latest Ref Rng & Units 09/12/2022   12:48 AM  BMP  Glucose 70 - 99 mg/dL 117   BUN 8 - 23 mg/dL 19   Creatinine 0.44 - 1.00 mg/dL 0.85   Sodium 135 - 145 mmol/L 140  Potassium 3.5 - 5.1 mmol/L 3.5   Chloride 98 - 111 mmol/L 101   CO2 22 - 32 mmol/L 30   Calcium 8.9 - 10.3 mg/dL 8.9    Other pertinent labs:  Troponin  14 TSH 1.738 Mg 1.6>6.5>6.3  EKG: NSR. Rhythm strips in  the room with NSR and brief episodes of sinus bradycardia  Imaging/Diagnostic Tests: CXR IMPRESSION: No active disease.   Christene Slates, MD 09/12/2022, 2:18 PM  PGY-1, [place] Intern pager: [phone], text pages welcome Secure chat group [place]

## 2022-09-12 NOTE — Hospital Course (Addendum)
Penny [name] is a 88 y.o. female presenting with multiple episodes of syncope with concern for symptomatic bradycardia. An episode occurred with EMS, she states that this episode was "not as severe" since she did not black out. EMS did note that she became significantly bradycardic Differential diagnoses and management as below. Pertinent PMH/PSH includes  COPD, right lung cancer status post wedge resection, aortic atherosclerosis, hypertension, hyperlipidemia.   Symptomatic sinus bradycardia Patient seen this morning, denies CP/SOB or N/V with her episodes, with normal troponin and no ischemic changes on EKG to suggest ACS. Neurologic exam without any focal deficits.  On admission, vitals stable and patient asymptomatic with benign cardiopulmonary exam. Echoccardiogram was unremarkable, with LVEF 60-65%. Cardiology was consulted, who recommended permanent discontinuation of metoprolol and 14 day Zio monitor prior to discharge. Follow up with OP Cardiology has been arranged.  On discharge, patient had no other bradycardic or syncopal episodes, repeat orthostatics are negative. Physical exam is normal.    CAD (coronary artery disease) Coronary calcifications seen on previous CT imaging. Home ASA 81 mg and Simvastatin were continued in hospital.   Hypomagnesemia Given IV Mg sulfate 2g for Mg 1.6. Goal Mg >2, intially increased to 6.5, now 1.9 on day of discharge.    Hypokalemia Given KCl 20meq x2 for K 3.3, now 3.5. Remained stable.  Hypokalemia likely chronically low due to HCTZ. Will discontinue from home regiment.    Essential hypertension BP 140-150s/70-80. On Metoprolol and HCTZ at home. Metoprolol and HCTZ permanently discontinued at discharge.    Issues for Follow Up: Follow-up appointment with internal medicine, for further management of blood pressure. Follow-up appointment with cardiology on 11/6 for Zio monitor

## 2022-09-12 NOTE — Progress Notes (Signed)
Mobility Specialist - Progress Note   09/12/22 1517  Mobility  Activity Stood at bedside  Level of Assistance Standby assist, set-up cues, supervision of patient - no hands on  Assistive Device None  Activity Response Tolerated well  Mobility Referral Yes  $Mobility charge 1 Mobility   Pt received in bed and agreeable. No complaints throughout. Returned to EOB with all needs met.   Larey Seat

## 2022-09-12 NOTE — Progress Notes (Signed)
Physical Therapy Treatment Patient Details Name: Penny [name] MRN: [id number] DOB: 1933-06-17 Today's Date: 09/12/2022   History of Present Illness Penny [name] is a 88 y.o. female admitted 10/8 presenting with multiple episodes of syncope with concern for symptomatic bradycardia.  Cardiology stopping metropolol.  PMH: partial lung removed due to cancer, COPD    PT Comments    Patient eager to ambulate and regain her strength prior to discharge home where she is typically alone during the day while daughter works. She admits to feeling weak in her legs and occasional "floaty" feeling while walking (lasts only a second), yet refuses to use RW. Discussed benefit of HHPT and she continues to refuse, stating she will be "up all the time" once she gets home and this will help her regain her strength. Max HR 123 bpm with very slow velocity gait and several standing rest breaks (200 ft total distance). Encouraged up in chair on return to room and pt agreeable. Daughter present throughout session.     Recommendations for follow up therapy are one component of a multi-disciplinary discharge planning process, led by the attending physician.  Recommendations may be updated based on patient status, additional functional criteria and insurance authorization.  Follow Up Recommendations  Other (comment) (pt refusing HHPT even after discussion today)     Assistance Recommended at Discharge Set up Supervision/Assistance  Patient can return home with the following A little help with walking and/or transfers;Assistance with cooking/housework;Assist for transportation;Help with stairs or ramp for entrance   Equipment Recommendations  None recommended by PT (pt refusing RW)    Recommendations for Other Services       Precautions / Restrictions Precautions Precautions: Fall Restrictions Weight Bearing Restrictions: No     Mobility  Bed Mobility Overal bed mobility: Needs Assistance Bed  Mobility: Supine to Sit, Sit to Supine     Supine to sit: Independent Sit to supine: Independent   General bed mobility comments: denied dizziness, no issues    Transfers Overall transfer level: Needs assistance Equipment used: None Transfers: Sit to/from Stand Sit to Stand: Min guard           General transfer comment: no imbalance or dizziness    Ambulation/Gait Ambulation/Gait assistance: Min guard Gait Distance (Feet): 200 Feet Assistive device: None Gait Pattern/deviations: Step-through pattern, Decreased stride length   Gait velocity interpretation: 1.31 - 2.62 ft/sec, indicative of limited community ambulator   General Gait Details: several standing resting breaks when briefly felt "floaty" HR max 123 bpm. no imbalance but very slow, shuffling type gait   Stairs             Wheelchair Mobility    Modified Rankin (Stroke Patients Only)       Balance Overall balance assessment: Needs assistance Sitting-balance support: No upper extremity supported, Feet supported Sitting balance-Leahy Scale: Good     Standing balance support: No upper extremity supported, During functional activity Standing balance-Leahy Scale: Fair Standing balance comment: No LOB without UE support statically.                            Cognition Arousal/Alertness: Awake/alert Behavior During Therapy: WFL for tasks assessed/performed Overall Cognitive Status: Within Functional Limits for tasks assessed                                 General Comments: although refuses use of  RW despite legs feeling weak and "jello-y"        Exercises      General Comments General comments (skin integrity, edema, etc.): daughter present      Pertinent Vitals/Pain Pain Assessment Pain Assessment: No/denies pain    Home Living                          Prior Function            PT Goals (current goals can now be found in the care plan  section) Acute Rehab PT Goals Patient Stated Goal: to go home PT Goal Formulation: With patient Time For Goal Achievement: 09/25/22 Potential to Achieve Goals: Good Progress towards PT goals: Progressing toward goals    Frequency    Min 3X/week      PT Plan Discharge plan needs to be updated    Co-evaluation              AM-PAC PT "6 Clicks" Mobility   Outcome Measure  Help needed turning from your back to your side while in a flat bed without using bedrails?: None Help needed moving from lying on your back to sitting on the side of a flat bed without using bedrails?: None Help needed moving to and from a bed to a chair (including a wheelchair)?: A Little Help needed standing up from a chair using your arms (e.g., wheelchair or bedside chair)?: A Little Help needed to walk in hospital room?: A Little Help needed climbing 3-5 steps with a railing? : A Lot 6 Click Score: 19    End of Session Equipment Utilized During Treatment: Gait belt Activity Tolerance: Patient tolerated treatment well Patient left: with call bell/phone within reach;in chair;with family/visitor present Nurse Communication: Mobility status PT Visit Diagnosis: Muscle weakness (generalized) (M62.81)     Time: 8101-7510 PT Time Calculation (min) (ACUTE ONLY): 18 min  Charges:  $Gait Training: 8-22 mins                      [facility]  Office [phone]    Penny [name] 09/12/2022, 12:11 PM

## 2022-09-12 NOTE — Progress Notes (Signed)
  Mobility Specialist - Progress Note  Orthostatic BPs BPs  HR  Supine 121/76(85) 86  Sitting 128/78(92) 88  Standing 127/82(96) 95  Standing after 3 min 129/86(98) Penny [name]  Mobility Specialist

## 2022-09-12 NOTE — Discharge Summary (Incomplete)
[facility] Discharge Summary  Patient name: Penny [name] Medical record number: [id number] Date of birth: March 20, 1933 Age: 88 y.o. Gender: female Date of Admission: 09/10/2022  Date of Discharge: 09/13/22   Admitting Physician: August Albino, MD  Primary Care Provider: Unk Pinto, MD Consultants: Cardiology  Indication for Hospitalization: Symptomatic Sinus bradycardia  Discharge Diagnoses/Problem List:  Principal Problem for Admission:  Other Problems addressed during stay:  Principal Problem:   Symptomatic sinus bradycardia Active Problems:   Essential hypertension   Hypokalemia   Hypomagnesemia   CAD (coronary artery disease)   Syncope    Brief Hospital Course:  Penny [name] is a 88 y.o. female presenting with multiple episodes of syncope with concern for symptomatic bradycardia. An episode occurred with EMS, she states that this episode was "not as severe" since she did not black out. EMS did note that she became significantly bradycardic down to the 20s. Differential diagnoses and management as below. Pertinent PMH/PSH includes COPD, right lung cancer status post wedge resection, aortic atherosclerosis, hypertension, hyperlipidemia.   Symptomatic sinus bradycardia Patient had one week of syncopal and presyncopal spells and EMS caught profound bradycardia to the 20s thought to be secondary to her home metoprolol. Patient did not have any CP with normal troponin and no ischemic changes on EKG to suggest ACS. Neurologic exam without any focal deficits.  On admission, vitals stable and patient asymptomatic with benign cardiopulmonary exam. Echoccardiogram was unremarkable, with LVEF 60-65%. Cardiology was consulted, who recommended permanent discontinuation of home metoprolol and 14 day Zio monitor prior to discharge. Follow up with OP Cardiology has been arranged.  On discharge, patient had no other bradycardic or syncopal episodes,  repeat orthostatics were negative.    CAD (coronary artery disease) Coronary calcifications seen on previous CT imaging. Home ASA 81 mg and Simvastatin were continued in hospital.   Hypomagnesemia Given IV Mg sulfate 2g for Mg 1.6. Goal Mg >2, intially increased to 6.5 and 6.3 on repeat. Likely error in blood draw with obtaining sample when magnesium was running. On discharge was stable at 1.9.   Hypokalemia Repleted in hospital prn. Hypokalemia likely chronically low due to home HCTZ which was discontinued at discharge.   Essential hypertension Remained stable in hospital. Home metoprolol and HCTZ permanently discontinued at discharge due to issues discussed above.   Disposition: Home (daughter lives with patient)  Discharge Condition: Stable   Issues for Follow Up: Follow-up appointment with PCP, for further management of blood pressure (HCTZ stopped at discharge) Avoid beta blockers (stopped metoprolol at discharge)  Recommend checking BMP, Mag at PCP visit Ensure zio patch in place for 14 days Ensure follow-up appointment with cardiology on 11/6   Discharge Exam:  Vitals:   09/13/22 0512 09/13/22 1039  BP: (!) 138/90 125/79  Pulse: 78 (!) 106  Resp:  16  Temp: 97.8 F (36.6 C) 98 F (36.7 C)  SpO2: 94% 95%   General: NAD, awake and alert, conversational and lighthearted Cardiovascular: RRR no MRG Respiratory: CTAB normal WOB on RA Gastrointestinal: Soft, NT/ND, normal BS MSK: 5/5 strength upper and lower extremities Derm: No bruises noted, normal skin thinning with age  Neuro: No focal deficits noted, speech is clear, AxO x4, able to follow commands, 5/5 strength Psych: Normal mentation  Significant Procedures: None  Significant Labs and Imaging:  No results for input(s): "WBC", "HGB", "HCT", "PLT" in the last 48 hours.  Recent Labs  Lab 09/12/22 0048  NA 140  K 3.5  CL 101  CO2 30  GLUCOSE 117*  BUN 19  CREATININE 0.85  CALCIUM 8.9  MG 1.9    Echocardiogram 09/11/2022 FINDINGS   Left Ventricle: Left ventricular ejection fraction, by estimation, is 60  to 65%. The left ventricle has normal function. The left ventricle has no  regional wall motion abnormalities. The left ventricular internal cavity  size was normal in size. There is   no left ventricular hypertrophy. Left ventricular diastolic parameters  are consistent with Grade I diastolic dysfunction (impaired relaxation).   Right Ventricle: The right ventricular size is normal. No increase in  right ventricular wall thickness. Right ventricular systolic function is  normal.   Left Atrium: Left atrial size was normal in size.   Right Atrium: Right atrial size was normal in size.   Pericardium: There is no evidence of pericardial effusion.   Mitral Valve: The mitral valve is normal in structure. No evidence of  mitral valve regurgitation. No evidence of mitral valve stenosis.   Tricuspid Valve: The tricuspid valve is normal in structure. Tricuspid  valve regurgitation is not demonstrated. No evidence of tricuspid  stenosis.   Aortic Valve: The aortic valve is tricuspid. There is mild calcification  of the aortic valve. Aortic valve regurgitation is not visualized. Aortic  valve sclerosis is present, with no evidence of aortic valve stenosis.  Aortic valve mean gradient measures  4.0 mmHg. Aortic valve peak gradient measures 7.6 mmHg. Aortic valve area,  by VTI measures 2.45 cm.   Pulmonic Valve: The pulmonic valve was normal in structure. Pulmonic valve  regurgitation is not visualized. No evidence of pulmonic stenosis.   Aorta: The aortic root is normal in size and structure.   Venous: The inferior vena cava is normal in size with greater than 50%  respiratory variability, suggesting right atrial pressure of 3 mmHg.   IAS/Shunts: No atrial level shunt detected by color flow Doppler.   Results/Tests Pending at Time of Discharge: None  Discharge  Medications:  Allergies as of 09/13/2022       Reactions   Ace Inhibitors Cough   Calcium-containing Compounds Other (See Comments)   constpation   Lactose Intolerance (gi) Diarrhea   Polysporin [bacitracin-polymyxin B] Rash   Polysporin or neosporin gave her a rash, couldn't remember which        Medication List     STOP taking these medications    CINNAMON PO   hydrochlorothiazide 25 MG tablet Commonly known as: HYDRODIURIL   metoprolol tartrate 50 MG tablet Commonly known as: LOPRESSOR       TAKE these medications    aspirin 81 MG chewable tablet Chew 81 mg by mouth every evening.   cetirizine 10 MG tablet Commonly known as: ZYRTEC Takes 1 tablet Daily What changed:  how much to take how to take this when to take this additional instructions   Cholecalciferol 125 MCG (5000 UT) capsule Take 1 capsules Daily What changed:  how much to take how to take this when to take this additional instructions   montelukast 10 MG tablet Commonly known as: SINGULAIR Take  1 tablet  Daily for Allergies                                                            /  TAKE                      BY                     MOUTH   simvastatin 40 MG tablet Commonly known as: ZOCOR Take  1 tablet  at Bedtime  for Cholesterol                                             /                                 TAKE                  BY                     MOUTH   Vitamin B-12 1000 MCG Subl Takes 1 tab SL Daily What changed:  how much to take how to take this when to take this additional instructions               Durable Medical Equipment  (From admission, onward)           Start     Ordered   09/13/22 0747  For home use only DME Walker rolling  Once       Question Answer Comment  Walker: With [place] Wheels   Patient needs a walker to treat with the following condition Weakness      09/13/22 0746            Discharge Instructions:  Please refer to Patient Instructions section of EMR for full details.  Patient was counseled important signs and symptoms that should prompt return to medical care, changes in medications, dietary instructions, activity restrictions, and follow up appointments.   Follow-Up Appointments:  Follow-up Information     Loel Dubonnet, NP Follow up.   Specialty: Cardiology Why: Cardiology follow-up has been arranged at [facility] on Monday November 6 at 9:15 AM (Arrive by 9:00 AM). Urban Gibson is one of our nurse practitioners with Dr. Harrell Gave. Contact information: [place] [address] [phone]         Unk Pinto, MD. Schedule an appointment as soon as possible for a visit in 2 day(s).   Specialty: Internal Medicine Contact information: [address] [place] [address] [phone]         [facility] Follow up.   Specialty: Home Health Services Why: Physical Therapy-office to call with visit times. Contact information: [address] [place] [address] [phone]                 Christene Slates, MD 09/13/2022, 12:53 PM PGY-1, [facility]   Upper Level Addendum:  I have seen and evaluated this patient along with Dr. Deveron Furlong and reviewed the above note, making necessary revisions as appropriate.  I agree with the medical decision making and physical exam as noted above.  Gerrit Heck, MD PGY-2 [facility]

## 2022-09-13 ENCOUNTER — Ambulatory Visit: Payer: Medicare Other | Admitting: Internal Medicine

## 2022-09-13 DIAGNOSIS — I10 Essential (primary) hypertension: Secondary | ICD-10-CM | POA: Diagnosis not present

## 2022-09-13 DIAGNOSIS — R001 Bradycardia, unspecified: Secondary | ICD-10-CM | POA: Diagnosis not present

## 2022-09-13 DIAGNOSIS — R55 Syncope and collapse: Secondary | ICD-10-CM | POA: Diagnosis not present

## 2022-09-13 DIAGNOSIS — E876 Hypokalemia: Secondary | ICD-10-CM | POA: Diagnosis not present

## 2022-09-13 DIAGNOSIS — I2510 Atherosclerotic heart disease of native coronary artery without angina pectoris: Secondary | ICD-10-CM | POA: Diagnosis not present

## 2022-09-13 NOTE — TOC Transition Note (Signed)
Transition of Care [facility]) - CM/SW Discharge Note   Patient Details  Name: Penny [name] MRN: [id number] Date of Birth: 09/16/1933  [facility] CM/SW Contact:  Bethena Roys, RN Phone Number: 09/13/2022, 2:29 PM   Clinical Narrative:  Late Entry: Patient discharging home today. Plan for home with home health. Initially patient declined services and now she wants services. Medicare.gov list provided to patient. Patient did not have a preference; Referral submitted to [facility] and they can service the patient. Start of care to begin within 24-48 hours post transition home. Daughter in the room to transport home. No further needs from Case Manager at this time.      Final next level of care: [place] Barriers to Discharge: No Barriers Identified   Patient Goals and CMS Choice Patient states their goals for this hospitalization and ongoing recovery are:: to return home. CMS Medicare.gov Compare Post Acute Care list provided to:: Patient Choice offered to / list presented to : Patient, Adult Children   Discharge Plan and Services In-house Referral: NA Discharge Planning Services: CM Consult Post Acute Care Choice: Home Health          DME Arranged: N/A DME Agency: NA       HH Arranged: PT HH Agency: [place] Date [place]: 09/13/22 Time HH Agency Contacted: 1210 Representative spoke with at [place]: Claiborne Billings   Readmission Risk Interventions     No data to display

## 2022-09-13 NOTE — Progress Notes (Signed)
Discharge instructions reviewed with pt and her daughter.  Copy of instructions given to pt.  No new scripts. Pt and daughter verbalized understanding of stopping metoprolol and HCTZ as instructed by doctor.  Pt to be d/c'd via wheelchair with belongings, with daughter.            Escorted by staff.

## 2022-09-13 NOTE — Plan of Care (Signed)

## 2022-09-13 NOTE — Progress Notes (Signed)
Mobility Specialist - Progress Note   09/13/22 1039  Mobility  Activity Ambulated with assistance in hallway  Level of Assistance Standby assist, set-up cues, supervision of patient - no hands on  Assistive Device None  Distance Ambulated (ft) 250 ft  Activity Response Tolerated well  Mobility Referral Yes  $Mobility charge 1 Mobility   Pt received in bed and agreeable. No complaints throughout. Pt returned to bed with all needs met.   Larey Seat

## 2022-09-13 NOTE — Progress Notes (Signed)
Occupational Therapy Treatment Patient Details Name: Penny [name] MRN: [id number] DOB: 1933/04/30 Today's Date: 09/13/2022   History of present illness Penny [name] is a 88 y.o. female admitted 10/8 presenting with multiple episodes of syncope with concern for symptomatic bradycardia.  Cardiology stopping metropolol.  PMH: partial lung removed due to cancer, COPD   OT comments  Pt functioning close to her baseline. Has been walking to the bathroom independently. Daughter will supervise first shower when she returns home. Pt has a seat she can use in the shower if needed.    Recommendations for follow up therapy are one component of a multi-disciplinary discharge planning process, led by the attending physician.  Recommendations may be updated based on patient status, additional functional criteria and insurance authorization.    Follow Up Recommendations  No OT follow up    Assistance Recommended at Discharge PRN  Patient can return home with the following  Help with stairs or ramp for entrance;Assist for transportation   Equipment Recommendations  None recommended by OT    Recommendations for Other Services      Precautions / Restrictions Precautions Precautions: Fall Restrictions Weight Bearing Restrictions: No       Mobility Bed Mobility Overal bed mobility: Modified Independent             General bed mobility comments: HOB up    Transfers Overall transfer level: Modified independent Equipment used: None                     Balance Overall balance assessment: Needs assistance   Sitting balance-Leahy Scale: Good       Standing balance-Leahy Scale: Fair                             ADL either performed or assessed with clinical judgement   ADL Overall ADL's : Modified independent                                     Functional mobility during ADLs: Modified independent General ADL Comments: Daughter  will supervise showering, pt has a seat should she need it.    Extremity/Trunk Assessment              Vision       Perception     Praxis      Cognition Arousal/Alertness: Awake/alert Behavior During Therapy: WFL for tasks assessed/performed Overall Cognitive Status: Within Functional Limits for tasks assessed                                 General Comments: some tendency to repeat herself        Exercises      Shoulder Instructions       General Comments      Pertinent Vitals/ Pain       Pain Assessment Pain Assessment: No/denies pain  Home Living                                          Prior Functioning/Environment              Frequency  Min 2X/week        Progress Toward Goals  OT Goals(current goals can now be found in the care plan section)  Progress towards OT goals: Progressing toward goals  Acute Rehab OT Goals OT Goal Formulation: With patient Time For Goal Achievement: 09/25/22 Potential to Achieve Goals: Good  Plan Discharge plan remains appropriate    Co-evaluation                 AM-PAC OT "6 Clicks" Daily Activity     Outcome Measure   Help from another person eating meals?: None Help from another person taking care of personal grooming?: None Help from another person toileting, which includes using toliet, bedpan, or urinal?: None Help from another person bathing (including washing, rinsing, drying)?: None Help from another person to put on and taking off regular upper body clothing?: None Help from another person to put on and taking off regular lower body clothing?: None 6 Click Score: 24    End of Session    OT Visit Diagnosis: Unsteadiness on feet (R26.81);Other abnormalities of gait and mobility (R26.89);Muscle weakness (generalized) (M62.81)   Activity Tolerance Patient tolerated treatment well   Patient Left in bed;with call bell/phone within reach;with  family/visitor present   Nurse Communication          Time: 7824-2353 OT Time Calculation (min): 22 min  Charges: OT General Charges $OT Visit: 1 Visit OT Treatments $Self Care/Home Management : 8-22 mins  Penny [name], OTR/L Acute Rehabilitation Services Office: [phone]   Penny [name] 09/13/2022, 12:37 PM

## 2022-09-14 ENCOUNTER — Telehealth: Payer: Self-pay

## 2022-09-14 NOTE — Patient Outreach (Signed)
  Care Coordination TOC Note Transition Care Management Follow-up Telephone Call Date of discharge and from where: [facility] 09/10/22-09/13/22 How have you been since you were released from the hospital? "Everything is good, I am feeling well". Any questions or concerns? No  Items Reviewed: Did the pt receive and understand the discharge instructions provided? Yes  Medications obtained and verified? Yes  Other? No  Any new allergies since your discharge? No  Dietary orders reviewed? Yes Do you have support at home? Yes   Home Care and Equipment/Supplies: Were home health services ordered? yes If so, what is the name of the agency? [facility]  Has the agency set up a time to come to the patient's home? no Were any new equipment or medical supplies ordered?  Yes: [facility] patch What is the name of the medical supply agency? [employer] Were you able to get the supplies/equipment? yes Do you have any questions related to the use of the equipment or supplies? No  Functional Questionnaire: (I = Independent and D = Dependent) ADLs: I  Bathing/Dressing- I  Meal Prep- I  Eating- I  Maintaining continence- I  Transferring/Ambulation- I  Managing Meds- I  Follow up appointments reviewed:  PCP Hospital f/u appt confirmed? No  Specialist Hospital f/u appt confirmed? Yes  Scheduled to see Laurann Montana, NP on 10/09/22 @ 0915. Are transportation arrangements needed? No  If their condition worsens, is the pt aware to call PCP or go to the Emergency Dept.? Yes Was the patient provided with contact information for the PCP's office or ED? Yes Was to pt encouraged to call back with questions or concerns? Yes  SDOH assessments and interventions completed:   Yes  Care Coordination Interventions Activated:  Yes   Care Coordination Interventions:  No Care Coordination interventions needed at this time.   Encounter Outcome:  Pt. Visit Completed

## 2022-09-18 DIAGNOSIS — E782 Mixed hyperlipidemia: Secondary | ICD-10-CM | POA: Diagnosis not present

## 2022-09-18 DIAGNOSIS — J449 Chronic obstructive pulmonary disease, unspecified: Secondary | ICD-10-CM | POA: Diagnosis not present

## 2022-09-18 DIAGNOSIS — Z85118 Personal history of other malignant neoplasm of bronchus and lung: Secondary | ICD-10-CM | POA: Diagnosis not present

## 2022-09-18 DIAGNOSIS — Z9181 History of falling: Secondary | ICD-10-CM | POA: Diagnosis not present

## 2022-09-18 DIAGNOSIS — I2510 Atherosclerotic heart disease of native coronary artery without angina pectoris: Secondary | ICD-10-CM | POA: Diagnosis not present

## 2022-09-18 DIAGNOSIS — E538 Deficiency of other specified B group vitamins: Secondary | ICD-10-CM | POA: Diagnosis not present

## 2022-09-18 DIAGNOSIS — E876 Hypokalemia: Secondary | ICD-10-CM | POA: Diagnosis not present

## 2022-09-18 DIAGNOSIS — M810 Age-related osteoporosis without current pathological fracture: Secondary | ICD-10-CM | POA: Diagnosis not present

## 2022-09-18 DIAGNOSIS — J439 Emphysema, unspecified: Secondary | ICD-10-CM | POA: Diagnosis not present

## 2022-09-18 DIAGNOSIS — Z8616 Personal history of COVID-19: Secondary | ICD-10-CM | POA: Diagnosis not present

## 2022-09-18 DIAGNOSIS — E559 Vitamin D deficiency, unspecified: Secondary | ICD-10-CM | POA: Diagnosis not present

## 2022-09-18 DIAGNOSIS — I10 Essential (primary) hypertension: Secondary | ICD-10-CM | POA: Diagnosis not present

## 2022-09-18 DIAGNOSIS — Z7982 Long term (current) use of aspirin: Secondary | ICD-10-CM | POA: Diagnosis not present

## 2022-09-18 DIAGNOSIS — I700 Atherosclerosis of aorta: Secondary | ICD-10-CM | POA: Diagnosis not present

## 2022-09-19 ENCOUNTER — Ambulatory Visit: Payer: Medicare Other

## 2022-09-20 ENCOUNTER — Ambulatory Visit (INDEPENDENT_AMBULATORY_CARE_PROVIDER_SITE_OTHER): Payer: Medicare Other | Admitting: Nurse Practitioner

## 2022-09-20 ENCOUNTER — Encounter: Payer: Self-pay | Admitting: Nurse Practitioner

## 2022-09-20 VITALS — BP 138/72 | HR 89 | Temp 97.5°F | Ht 62.0 in | Wt 146.0 lb

## 2022-09-20 DIAGNOSIS — I2510 Atherosclerotic heart disease of native coronary artery without angina pectoris: Secondary | ICD-10-CM | POA: Diagnosis not present

## 2022-09-20 DIAGNOSIS — Z23 Encounter for immunization: Secondary | ICD-10-CM | POA: Diagnosis not present

## 2022-09-20 DIAGNOSIS — R001 Bradycardia, unspecified: Secondary | ICD-10-CM | POA: Diagnosis not present

## 2022-09-20 DIAGNOSIS — Z79899 Other long term (current) drug therapy: Secondary | ICD-10-CM | POA: Diagnosis not present

## 2022-09-20 DIAGNOSIS — Z09 Encounter for follow-up examination after completed treatment for conditions other than malignant neoplasm: Secondary | ICD-10-CM

## 2022-09-20 DIAGNOSIS — R55 Syncope and collapse: Secondary | ICD-10-CM | POA: Diagnosis not present

## 2022-09-20 DIAGNOSIS — H6121 Impacted cerumen, right ear: Secondary | ICD-10-CM

## 2022-09-20 DIAGNOSIS — I2583 Coronary atherosclerosis due to lipid rich plaque: Secondary | ICD-10-CM | POA: Diagnosis not present

## 2022-09-20 DIAGNOSIS — I10 Essential (primary) hypertension: Secondary | ICD-10-CM | POA: Diagnosis not present

## 2022-09-20 DIAGNOSIS — E876 Hypokalemia: Secondary | ICD-10-CM

## 2022-09-20 NOTE — Progress Notes (Signed)
Hospital follow up  Assessment and Plan: Hospital visit follow up for:   Indication for Hospitalization Follow Up Symptomatic Sinus bradycardia, syncope. Reviewed all discharge instructions and medications, follow up appointments. All questions and concerns addressed.  Symptomatic sinus bradycardia Do not take Metoprolol or HCTZ Continue ZIO patch/cardiac holter moniter Follow up with Cardiology 10/2022  Essential hypertension Controlled Discussed DASH (Dietary Approaches to Stop Hypertension) DASH diet is lower in sodium than a typical American diet. Cut back on foods that are high in saturated fat, cholesterol, and trans fats. Eat more whole-grain foods, fish, poultry, and nuts Remain active and exercise as tolerated daily.  Monitor BP at home-Call if consistently greater than 130/80 or <90/60.    Hypokalemia Do not take HCTZ Levels re-checked today Continue to monitor   Hypomagnesemia Levels re-checked today. Continue to monitor  CAD (coronary artery disease) Continue bASA 81 mg and Simvastatin  Continue to follow with cardiology as scheduled. Discussed lifestyle modifications. Recommended diet heavy in fruits and veggies, omega 3's. Decrease consumption of animal meats, cheeses, and dairy products.  Medication management All medications discussed and reviewed in full. All questions and concerns regarding medications addressed.    Right ear cerumen impaction Right ear(s) irrigated with lukewarm water, hydrogen peroxide. Large piece of brown cerumen x2 extracted with ear curette. TM:  WNL.  Pt tolerated procedure well.    Flu vaccine need Administered. Patient tolerated well    All medications were reviewed with patient and family and fully reconciled. All questions answered fully, and patient and family members were encouraged to call the office with any further questions or concerns. Discussed goal to avoid readmission related to this diagnosis.   Over 40  minutes of exam, counseling, chart review, and complex, high/moderate level critical decision making was performed this visit.   Future Appointments  Date Time Provider [place]  09/20/2022  9:00 AM Darrol Jump, NP [facility] None  10/09/2022  9:15 AM Loel Dubonnet, NP [facility]  11/07/2022  9:00 AM [facility] MM 2 [facility]  02/08/2023  2:00 PM Alycia Rossetti, NP [facility] None     HPI 88 y.o.female presents for follow up for transition from recent hospitalization or [facility] stay. Admit date to the hospital was 09/10/22, patient was discharged from the hospital on 09/13/22 and our clinical staff contacted the office the day after discharge to set up a follow up appointment. The discharge summary, medications, and diagnostic test results were reviewed before meeting with the patient. The patient was admitted for:   Symptomatic sinus bradycardia  Patient is an 88 year old female with a history of hypertension, hyperlipidemia, prior lung cancer status post lung resection on the right who is presenting today with multiple episodes of syncope over the last 2 days. Pt with multiple medical problems and comorbidities who presented with to the ER with reports of multiple episodes of syncope in the last 2 days.  Patient was on the monitor during one of the events with EMS and went into a bradycardic rhythm which EMS reports she dropped as low as 28.  She did not have loss of consciousness with EMS.  Patient denies any symptoms at this time.  She denies any recent systemic illness.  Vital signs are reassuring at that time. Noted to have asovagal events with sinus bradycardia.   Neurologic exam without any focal deficits. On admission, vitals stable and patient asymptomatic with benign cardiopulmonary exam. Echoccardiogram was unremarkable, with LVEF 60-65%. Cardiology was consulted, who recommended permanent  discontinuation of home metoprolol and 14 day Zio monitor prior to discharge.   She is wearing today with no issues. She is scheduled to follow up 10/09/2022.  On discharge, patient had no other bradycardic or syncopal episodes, repeat orthostatics were negative.   She continues to be eating and drinking normally.  Asymptomatic in clinic.  She is unable to place her right hearing aide in d/t cerumen impaction.    Home health is involved.   Images while in the hospital: ECHOCARDIOGRAM COMPLETE  Result Date: 09/11/2022    ECHOCARDIOGRAM REPORT   Patient Name:   [name] Date of Exam: 09/11/2022 Medical Rec #:  [id number]            Height:       62.0 in Accession #:    [id number]           Weight:       143.0 lb Date of Birth:  [birth year]             BSA:          1.658 m Patient Age:    88 years             BP:           158/80 mmHg Patient Gender: F                    HR:           87 bpm. Exam Location:  Inpatient Procedure: 2D Echo, Color Doppler, Cardiac Doppler and Intracardiac            Opacification Agent Indications:    Syncope  History:        Patient has no prior history of Echocardiogram examinations.                 COPD; Risk Factors:Hypertension and Dyslipidemia.  Sonographer:    Memory Argue Referring Phys: [place]  1. Left ventricular ejection fraction, by estimation, is 60 to 65%. The left ventricle has normal function. The left ventricle has no regional wall motion abnormalities. Left ventricular diastolic parameters are consistent with Grade I diastolic dysfunction (impaired relaxation).  2. Right ventricular systolic function is normal. The right ventricular size is normal.  3. The mitral valve is normal in structure. No evidence of mitral valve regurgitation. No evidence of mitral stenosis.  4. The aortic valve is tricuspid. There is mild calcification of the aortic valve. Aortic valve regurgitation is not visualized. Aortic valve sclerosis is present, with no evidence of aortic valve stenosis. Aortic valve mean gradient  measures 4.0 mmHg. Aortic valve Vmax measures 1.38 m/s.  5. The inferior vena cava is normal in size with greater than 50% respiratory variability, suggesting right atrial pressure of 3 mmHg. FINDINGS  Left Ventricle: Left ventricular ejection fraction, by estimation, is 60 to 65%. The left ventricle has normal function. The left ventricle has no regional wall motion abnormalities. The left ventricular internal cavity size was normal in size. There is  no left ventricular hypertrophy. Left ventricular diastolic parameters are consistent with Grade I diastolic dysfunction (impaired relaxation). Right Ventricle: The right ventricular size is normal. No increase in right ventricular wall thickness. Right ventricular systolic function is normal. Left Atrium: Left atrial size was normal in size. Right Atrium: Right atrial size was normal in size. Pericardium: There is no evidence of pericardial effusion. Mitral Valve: The mitral valve is normal in structure. No evidence of  mitral valve regurgitation. No evidence of mitral valve stenosis. Tricuspid Valve: The tricuspid valve is normal in structure. Tricuspid valve regurgitation is not demonstrated. No evidence of tricuspid stenosis. Aortic Valve: The aortic valve is tricuspid. There is mild calcification of the aortic valve. Aortic valve regurgitation is not visualized. Aortic valve sclerosis is present, with no evidence of aortic valve stenosis. Aortic valve mean gradient measures 4.0 mmHg. Aortic valve peak gradient measures 7.6 mmHg. Aortic valve area, by VTI measures 2.45 cm. Pulmonic Valve: The pulmonic valve was normal in structure. Pulmonic valve regurgitation is not visualized. No evidence of pulmonic stenosis. Aorta: The aortic root is normal in size and structure. Venous: The inferior vena cava is normal in size with greater than 50% respiratory variability, suggesting right atrial pressure of 3 mmHg. IAS/Shunts: No atrial level shunt detected by color flow  Doppler.  LEFT VENTRICLE PLAX 2D LVIDd:         3.30 cm   Diastology LVIDs:         2.20 cm   LV e' medial:    5.13 cm/s LV PW:         1.00 cm   LV E/e' medial:  14.0 LV IVS:        1.10 cm   LV e' lateral:   5.75 cm/s LVOT diam:     2.00 cm   LV E/e' lateral: 12.5 LV SV:         56 LV SV Index:   34 LVOT Area:     3.14 cm  RIGHT VENTRICLE TAPSE (M-mode): 1.9 cm LEFT ATRIUM             Index        RIGHT ATRIUM          Index LA Vol (A2C):   31.9 ml 19.24 ml/m  RA Area:     8.48 cm LA Vol (A4C):   46.2 ml 27.87 ml/m  RA Volume:   16.20 ml 9.77 ml/m LA Biplane Vol: 39.4 ml 23.77 ml/m  AORTIC VALVE AV Area (Vmax):    2.37 cm AV Area (Vmean):   2.36 cm AV Area (VTI):     2.45 cm AV Vmax:           138.00 cm/s AV Vmean:          88.100 cm/s AV VTI:            0.228 m AV Peak Grad:      7.6 mmHg AV Mean Grad:      4.0 mmHg LVOT Vmax:         104.00 cm/s LVOT Vmean:        66.100 cm/s LVOT VTI:          0.178 m LVOT/AV VTI ratio: 0.78  AORTA Ao Root diam: 3.00 cm MITRAL VALVE MV Area (PHT): 5.13 cm     SHUNTS MV Decel Time: 148 msec     Systemic VTI:  0.18 m MV E velocity: 71.80 cm/s   Systemic Diam: 2.00 cm MV A velocity: 110.00 cm/s MV E/A ratio:  0.65 Candee Furbish MD Electronically signed by Candee Furbish MD Signature Date/Time: 09/11/2022/10:42:54 AM    Final       Current Outpatient Medications (Cardiovascular):    simvastatin (ZOCOR) 40 MG tablet, Take  1 tablet  at Bedtime  for Cholesterol                                             /  TAKE                  BY                     MOUTH  Current Outpatient Medications (Respiratory):    cetirizine (ZYRTEC) 10 MG tablet, Takes 1 tablet Daily (Patient taking differently: Take 10 mg by mouth every evening.)   montelukast (SINGULAIR) 10 MG tablet, Take  1 tablet  Daily for Allergies                                                            /                            TAKE                      BY                      MOUTH  Current Outpatient Medications (Analgesics):    aspirin 81 MG chewable tablet, Chew 81 mg by mouth every evening.  Current Outpatient Medications (Hematological):    Cyanocobalamin (VITAMIN B-12) 1000 MCG SUBL, Takes 1 tab SL Daily (Patient taking differently: Take 1 tablet by mouth daily.)  Current Outpatient Medications (Other):    Cholecalciferol 125 MCG (5000 UT) capsule, Take 1 capsules Daily (Patient taking differently: Take 5,000 Units by mouth daily.)  Past Medical History:  Diagnosis Date   COPD (chronic obstructive pulmonary disease) with emphysema ([place])    COVID-19 12/09/2019   Hyperlipidemia    Hypertension    Lung cancer, upper lobe ([place]) 12/04/1996   RUL   Osteoporosis    S/P partial lobectomy of lung    [id number]   Thigh shingles 12/04/1996   Thoracic aorta atherosclerosis ([facility])    Vitamin D deficiency      Allergies  Allergen Reactions   Ace Inhibitors Cough   Calcium-Containing Compounds Other (See Comments)    constpation   Lactose Intolerance (Gi) Diarrhea   Polysporin [Bacitracin-Polymyxin B] Rash    Polysporin or neosporin gave her a rash, couldn't remember which    ROS: all negative except above.   Physical Exam: There were no vitals filed for this visit. There were no vitals taken for this visit. General Appearance: Well nourished, in no apparent distress. Eyes: PERRLA, EOMs, conjunctiva no swelling or erythema Sinuses: No Frontal/maxillary tenderness ENT/Mouth: Right ear UTA TM d/t cerument impaction.  Ext aud canals clear, Left TM without erythema, bulging. No erythema, swelling, or exudate on post pharynx.  Tonsils not swollen or erythematous.   Neck: Supple, thyroid normal.  Respiratory: Respiratory effort normal, BS equal bilaterally without rales, rhonchi, wheezing or stridor.  Cardio: Holter monitor in place. RRR with no MRGs. Brisk peripheral pulses without edema.  Abdomen: Soft, + BS.  Non tender, no guarding, rebound, hernias,  masses. Lymphatics: Non tender without lymphadenopathy.  Musculoskeletal: Full ROM, 5/5 strength, normal gait.  Skin: Warm, dry without rashes, lesions, ecchymosis.  Neuro: Cranial nerves intact. Normal muscle tone, no cerebellar symptoms. Sensation intact.  Psych: Awake and oriented X 3, normal affect, Insight and Judgment appropriate.     Darrol Jump, NP 8:42 AM [facility]

## 2022-09-20 NOTE — Patient Instructions (Addendum)
Bradycardia, Adult Bradycardia is a slower-than-normal heartbeat. A normal resting heart rate for an adult ranges from 60 to 100 beats per minute. With bradycardia, the resting heart rate is less than 60 beats per minute. Bradycardia can prevent enough oxygen from reaching certain areas of your body when you are active. It can be serious if it keeps enough oxygen from reaching your brain and other parts of your body. Bradycardia is not a problem for everyone. For some healthy adults, a slow resting heart rate is normal. What are the causes? This condition may be caused by: A problem with the heart, including: A problem with the heart's electrical system, such as a heart block. With a heart block, electrical signals between the chambers of the heart are partially or completely blocked, so they are not able to work as they should. A problem with the heart's natural pacemaker (sinus node). Heart disease. A heart attack. Heart damage. Lyme disease. A heart infection. A heart condition that is present at birth (congenital heart defect). Certain medicines that treat heart conditions. Certain conditions, such as hypothyroidism and obstructive sleep apnea. Problems with the balance of chemicals and other substances, like potassium, in the blood. Trauma. Radiation therapy. What increases the risk? You are more likely to develop this condition if you: Are age 65 or older. Have high blood pressure (hypertension), high cholesterol (hyperlipidemia), or diabetes. Drink heavily, use tobacco or nicotine products, or use drugs. What are the signs or symptoms? Symptoms of this condition include: Light-headedness. Feeling faint or fainting. Fatigue and weakness. Trouble with activity or exercise. Shortness of breath. Chest pain (angina). Drowsiness. Confusion. Dizziness. How is this diagnosed? This condition may be diagnosed based on: Your symptoms. Your medical history. A physical exam. During  the exam, your health care provider will listen to your heartbeat and check your pulse. To confirm the diagnosis, your health care provider may order tests, such as: Blood tests. An electrocardiogram (ECG). This test records the heart's electrical activity. The test can show how fast your heart is beating and whether the heartbeat is steady. A test in which you wear a portable device (event recorder or Holter monitor) to record your heart's electrical activity while you go about your day. An exercise test. How is this treated? Treatment for this condition depends on the cause of the condition and how severe your symptoms are. Treatment may involve: Treatment of the underlying condition. Changing your medicines or how much medicine you take. Having a Kelley, battery-operated device called a pacemaker implanted under the skin. When bradycardia occurs, this device can be used to increase your heart rate and help your heart beat in a regular rhythm. Follow these instructions at home: Lifestyle Manage any health conditions that contribute to bradycardia as told by your health care provider. Follow a heart-healthy diet. A nutrition specialist (dietitian) can help educate you about healthy food options and changes. Follow an exercise program that is approved by your health care provider. Maintain a healthy weight. Try to reduce or manage your stress, such as with yoga or meditation. If you need help reducing stress, ask your health care provider. Do not use any products that contain nicotine or tobacco. These products include cigarettes, chewing tobacco, and vaping devices, such as e-cigarettes. If you need help quitting, ask your health care provider. Do not use illegal drugs. Alcohol use If you drink alcohol: Limit how much you have to: 0-1 drink a day for women who are not pregnant. 0-2 drinks a day   for men. Know how much alcohol is in a drink. In the [place] one drink equals one 12 oz bottle of  beer (355 mL), one 5 oz glass of wine (148 mL), or one 1 oz glass of hard liquor (44 mL). General instructions Take over-the-counter and prescription medicines only as told by your health care provider. Keep all follow-up visits. This is important. How is this prevented? In some cases, bradycardia may be prevented by: Treating underlying medical problems. Stopping behaviors or medicines that can trigger the condition. Contact a health care provider if: You feel light-headed or dizzy. You almost faint. You feel weak or are easily fatigued during physical activity. You experience confusion or have memory problems. Get help right away if: You faint. You have chest pains or an irregular heartbeat (palpitations). You have trouble breathing. These symptoms may represent a serious problem that is an emergency. Do not wait to see if the symptoms will go away. Get medical help right away. Call your local emergency services (911 in the [place] Do not drive yourself to the hospital. Summary Bradycardia is a slower-than-normal heartbeat. With bradycardia, the resting heart rate is less than 60 beats per minute. Treatment for this condition depends on the cause. Manage any health conditions that contribute to bradycardia as told by your health care provider. Do not use any products that contain nicotine or tobacco. These products include cigarettes, chewing tobacco, and vaping devices, such as e-cigarettes. Keep all follow-up visits. This is important. This information is not intended to replace advice given to you by your health care provider. Make sure you discuss any questions you have with your health care provider. Document Revised: 03/13/2021 Document Reviewed: 03/13/2021 Elsevier Patient Education  2023 [employer]  

## 2022-09-21 LAB — CBC WITH DIFFERENTIAL/PLATELET
Absolute Monocytes: 634 cells/uL (ref 200–950)
Basophils Absolute: 32 cells/uL (ref 0–200)
Basophils Relative: 0.5 %
Eosinophils Absolute: 141 cells/uL (ref 15–500)
Eosinophils Relative: 2.2 %
HCT: 41.4 % (ref 35.0–45.0)
Hemoglobin: 13.4 g/dL (ref 11.7–15.5)
Lymphs Abs: 1318 cells/uL (ref 850–3900)
MCH: 28.2 pg (ref 27.0–33.0)
MCHC: 32.4 g/dL (ref 32.0–36.0)
MCV: 87 fL (ref 80.0–100.0)
MPV: 11.1 fL (ref 7.5–12.5)
Monocytes Relative: 9.9 %
Neutro Abs: 4275 cells/uL (ref 1500–7800)
Neutrophils Relative %: 66.8 %
Platelets: 172 10*3/uL (ref 140–400)
RBC: 4.76 10*6/uL (ref 3.80–5.10)
RDW: 12.3 % (ref 11.0–15.0)
Total Lymphocyte: 20.6 %
WBC: 6.4 10*3/uL (ref 3.8–10.8)

## 2022-09-21 LAB — COMPLETE METABOLIC PANEL WITH GFR
AG Ratio: 1.6 (calc) (ref 1.0–2.5)
ALT: 15 U/L (ref 6–29)
AST: 21 U/L (ref 10–35)
Albumin: 4.1 g/dL (ref 3.6–5.1)
Alkaline phosphatase (APISO): 67 U/L (ref 37–153)
BUN: 18 mg/dL (ref 7–25)
CO2: 29 mmol/L (ref 20–32)
Calcium: 9.5 mg/dL (ref 8.6–10.4)
Chloride: 106 mmol/L (ref 98–110)
Creat: 0.72 mg/dL (ref 0.60–0.95)
Globulin: 2.5 g/dL (calc) (ref 1.9–3.7)
Glucose, Bld: 90 mg/dL (ref 65–99)
Potassium: 4.6 mmol/L (ref 3.5–5.3)
Sodium: 144 mmol/L (ref 135–146)
Total Bilirubin: 0.4 mg/dL (ref 0.2–1.2)
Total Protein: 6.6 g/dL (ref 6.1–8.1)
eGFR: 81 mL/min/{1.73_m2} (ref >=60)

## 2022-09-21 LAB — MAGNESIUM: Magnesium: 1.9 mg/dL (ref 1.5–2.5)

## 2022-09-26 DIAGNOSIS — Z8616 Personal history of COVID-19: Secondary | ICD-10-CM | POA: Diagnosis not present

## 2022-09-26 DIAGNOSIS — E538 Deficiency of other specified B group vitamins: Secondary | ICD-10-CM | POA: Diagnosis not present

## 2022-09-26 DIAGNOSIS — M810 Age-related osteoporosis without current pathological fracture: Secondary | ICD-10-CM | POA: Diagnosis not present

## 2022-09-26 DIAGNOSIS — J439 Emphysema, unspecified: Secondary | ICD-10-CM | POA: Diagnosis not present

## 2022-09-26 DIAGNOSIS — E782 Mixed hyperlipidemia: Secondary | ICD-10-CM | POA: Diagnosis not present

## 2022-09-26 DIAGNOSIS — E876 Hypokalemia: Secondary | ICD-10-CM | POA: Diagnosis not present

## 2022-09-26 DIAGNOSIS — Z9181 History of falling: Secondary | ICD-10-CM | POA: Diagnosis not present

## 2022-09-26 DIAGNOSIS — Z85118 Personal history of other malignant neoplasm of bronchus and lung: Secondary | ICD-10-CM | POA: Diagnosis not present

## 2022-09-26 DIAGNOSIS — Z7982 Long term (current) use of aspirin: Secondary | ICD-10-CM | POA: Diagnosis not present

## 2022-09-26 DIAGNOSIS — I10 Essential (primary) hypertension: Secondary | ICD-10-CM | POA: Diagnosis not present

## 2022-09-26 DIAGNOSIS — J449 Chronic obstructive pulmonary disease, unspecified: Secondary | ICD-10-CM | POA: Diagnosis not present

## 2022-09-26 DIAGNOSIS — E559 Vitamin D deficiency, unspecified: Secondary | ICD-10-CM | POA: Diagnosis not present

## 2022-09-26 DIAGNOSIS — I2510 Atherosclerotic heart disease of native coronary artery without angina pectoris: Secondary | ICD-10-CM | POA: Diagnosis not present

## 2022-09-26 DIAGNOSIS — I700 Atherosclerosis of aorta: Secondary | ICD-10-CM | POA: Diagnosis not present

## 2022-09-28 DIAGNOSIS — I10 Essential (primary) hypertension: Secondary | ICD-10-CM | POA: Diagnosis not present

## 2022-09-28 DIAGNOSIS — J439 Emphysema, unspecified: Secondary | ICD-10-CM | POA: Diagnosis not present

## 2022-09-28 DIAGNOSIS — Z85118 Personal history of other malignant neoplasm of bronchus and lung: Secondary | ICD-10-CM | POA: Diagnosis not present

## 2022-09-28 DIAGNOSIS — M810 Age-related osteoporosis without current pathological fracture: Secondary | ICD-10-CM | POA: Diagnosis not present

## 2022-09-28 DIAGNOSIS — E559 Vitamin D deficiency, unspecified: Secondary | ICD-10-CM | POA: Diagnosis not present

## 2022-09-28 DIAGNOSIS — J449 Chronic obstructive pulmonary disease, unspecified: Secondary | ICD-10-CM | POA: Diagnosis not present

## 2022-09-28 DIAGNOSIS — E876 Hypokalemia: Secondary | ICD-10-CM | POA: Diagnosis not present

## 2022-09-28 DIAGNOSIS — Z7982 Long term (current) use of aspirin: Secondary | ICD-10-CM | POA: Diagnosis not present

## 2022-09-28 DIAGNOSIS — E538 Deficiency of other specified B group vitamins: Secondary | ICD-10-CM | POA: Diagnosis not present

## 2022-09-28 DIAGNOSIS — E782 Mixed hyperlipidemia: Secondary | ICD-10-CM | POA: Diagnosis not present

## 2022-09-28 DIAGNOSIS — I700 Atherosclerosis of aorta: Secondary | ICD-10-CM | POA: Diagnosis not present

## 2022-09-28 DIAGNOSIS — Z8616 Personal history of COVID-19: Secondary | ICD-10-CM | POA: Diagnosis not present

## 2022-09-28 DIAGNOSIS — Z9181 History of falling: Secondary | ICD-10-CM | POA: Diagnosis not present

## 2022-09-28 DIAGNOSIS — I2510 Atherosclerotic heart disease of native coronary artery without angina pectoris: Secondary | ICD-10-CM | POA: Diagnosis not present

## 2022-10-03 DIAGNOSIS — I700 Atherosclerosis of aorta: Secondary | ICD-10-CM | POA: Diagnosis not present

## 2022-10-03 DIAGNOSIS — Z7982 Long term (current) use of aspirin: Secondary | ICD-10-CM | POA: Diagnosis not present

## 2022-10-03 DIAGNOSIS — J439 Emphysema, unspecified: Secondary | ICD-10-CM | POA: Diagnosis not present

## 2022-10-03 DIAGNOSIS — E559 Vitamin D deficiency, unspecified: Secondary | ICD-10-CM | POA: Diagnosis not present

## 2022-10-03 DIAGNOSIS — Z9181 History of falling: Secondary | ICD-10-CM | POA: Diagnosis not present

## 2022-10-03 DIAGNOSIS — Z85118 Personal history of other malignant neoplasm of bronchus and lung: Secondary | ICD-10-CM | POA: Diagnosis not present

## 2022-10-03 DIAGNOSIS — I2510 Atherosclerotic heart disease of native coronary artery without angina pectoris: Secondary | ICD-10-CM | POA: Diagnosis not present

## 2022-10-03 DIAGNOSIS — E876 Hypokalemia: Secondary | ICD-10-CM | POA: Diagnosis not present

## 2022-10-03 DIAGNOSIS — I10 Essential (primary) hypertension: Secondary | ICD-10-CM | POA: Diagnosis not present

## 2022-10-03 DIAGNOSIS — J449 Chronic obstructive pulmonary disease, unspecified: Secondary | ICD-10-CM | POA: Diagnosis not present

## 2022-10-03 DIAGNOSIS — E538 Deficiency of other specified B group vitamins: Secondary | ICD-10-CM | POA: Diagnosis not present

## 2022-10-03 DIAGNOSIS — M810 Age-related osteoporosis without current pathological fracture: Secondary | ICD-10-CM | POA: Diagnosis not present

## 2022-10-03 DIAGNOSIS — E782 Mixed hyperlipidemia: Secondary | ICD-10-CM | POA: Diagnosis not present

## 2022-10-03 DIAGNOSIS — Z8616 Personal history of COVID-19: Secondary | ICD-10-CM | POA: Diagnosis not present

## 2022-10-04 NOTE — Addendum Note (Signed)
Encounter addended by: Gerarda Gunther on: 10/04/2022 8:45 AM  Actions taken: Imaging Exam ended

## 2022-10-05 DIAGNOSIS — J449 Chronic obstructive pulmonary disease, unspecified: Secondary | ICD-10-CM | POA: Diagnosis not present

## 2022-10-05 DIAGNOSIS — I10 Essential (primary) hypertension: Secondary | ICD-10-CM | POA: Diagnosis not present

## 2022-10-05 DIAGNOSIS — Z8616 Personal history of COVID-19: Secondary | ICD-10-CM | POA: Diagnosis not present

## 2022-10-05 DIAGNOSIS — E782 Mixed hyperlipidemia: Secondary | ICD-10-CM | POA: Diagnosis not present

## 2022-10-05 DIAGNOSIS — E559 Vitamin D deficiency, unspecified: Secondary | ICD-10-CM | POA: Diagnosis not present

## 2022-10-05 DIAGNOSIS — M810 Age-related osteoporosis without current pathological fracture: Secondary | ICD-10-CM | POA: Diagnosis not present

## 2022-10-05 DIAGNOSIS — I2510 Atherosclerotic heart disease of native coronary artery without angina pectoris: Secondary | ICD-10-CM | POA: Diagnosis not present

## 2022-10-05 DIAGNOSIS — Z7982 Long term (current) use of aspirin: Secondary | ICD-10-CM | POA: Diagnosis not present

## 2022-10-05 DIAGNOSIS — E876 Hypokalemia: Secondary | ICD-10-CM | POA: Diagnosis not present

## 2022-10-05 DIAGNOSIS — J439 Emphysema, unspecified: Secondary | ICD-10-CM | POA: Diagnosis not present

## 2022-10-05 DIAGNOSIS — Z85118 Personal history of other malignant neoplasm of bronchus and lung: Secondary | ICD-10-CM | POA: Diagnosis not present

## 2022-10-05 DIAGNOSIS — I700 Atherosclerosis of aorta: Secondary | ICD-10-CM | POA: Diagnosis not present

## 2022-10-05 DIAGNOSIS — Z9181 History of falling: Secondary | ICD-10-CM | POA: Diagnosis not present

## 2022-10-05 DIAGNOSIS — E538 Deficiency of other specified B group vitamins: Secondary | ICD-10-CM | POA: Diagnosis not present

## 2022-10-09 ENCOUNTER — Encounter (HOSPITAL_BASED_OUTPATIENT_CLINIC_OR_DEPARTMENT_OTHER): Payer: Self-pay | Admitting: Family

## 2022-10-09 ENCOUNTER — Ambulatory Visit (HOSPITAL_BASED_OUTPATIENT_CLINIC_OR_DEPARTMENT_OTHER): Payer: Medicare Other | Admitting: Family

## 2022-10-09 VITALS — BP 142/82 | HR 82 | Ht 62.0 in | Wt 146.0 lb

## 2022-10-09 DIAGNOSIS — R55 Syncope and collapse: Secondary | ICD-10-CM

## 2022-10-09 DIAGNOSIS — I700 Atherosclerosis of aorta: Secondary | ICD-10-CM

## 2022-10-09 DIAGNOSIS — I10 Essential (primary) hypertension: Secondary | ICD-10-CM

## 2022-10-09 DIAGNOSIS — E782 Mixed hyperlipidemia: Secondary | ICD-10-CM

## 2022-10-09 NOTE — Progress Notes (Signed)
Office Visit    Patient Name: Penny [name] Date of Encounter: 10/09/2022  PCP:  Unk Pinto, [place]  Cardiologist:  Buford Dresser, MD  Advanced Practice Provider:  No care team member to display Electrophysiologist:  None      Chief Complaint    Penny [name] is a 88 y.o. female presents today for follow up after hospitalization and cardiac monitor.   Past Medical History    Past Medical History:  Diagnosis Date   COPD (chronic obstructive pulmonary disease) with emphysema ([place])    COVID-19 12/09/2019   Hyperlipidemia    Hypertension    Lung cancer, upper lobe ([place]) 12/04/1996   RUL   Osteoporosis    S/P partial lobectomy of lung    [id number]   Thigh shingles 12/04/1996   Thoracic aorta atherosclerosis ([place])    Vitamin D deficiency    Past Surgical History:  Procedure Laterality Date   CARPAL TUNNEL RELEASE Right    2009   LUNG REMOVAL, PARTIAL Right    RUL 1998   OLECRANON BURSECTOMY Left 03/20/2017   Procedure: EXICISION OLECRANON BURSA LEFT ELBOW;  Surgeon: Daryll Brod, MD;  Location: [facility];  Service: Orthopedics;  Laterality: Left;  with scb block in preop   SHOULDER ADHESION RELEASE Right    2004    Allergies  Allergies  Allergen Reactions   Ace Inhibitors Cough   Calcium-Containing Compounds Other (See Comments)    constpation   Lactose Intolerance (Gi) Diarrhea   Polysporin [Bacitracin-Polymyxin B] Rash    Polysporin or neosporin gave her a rash, couldn't remember which    History of Present Illness    Penny [name] is a 88 y.o. female with a hx of coronary calcification by CT, syncope, hypertension, right lung cancer s/p wedge resection, COPD, HLD last seen while admitted.  Admitted 09/10/22 after syncopal episode while sitting on sofa with incontinence with two recurrent episodes. Her Metoprolol was discontinued. Discussed with EP given no significant pauses  while admitted no indication for PPM. Echo LVEF 60-65%, no RWMA, gr1DD, aortic valve sclerosis without stenosis. Marland Kitchen Discharged with monitor with preliminary report showing predominantly NSR average heart rate 86 bpm, 19 runs of SVT (fastest 10.6 seconds rate 211 bpm), rare PVC/PAC. No significant pause nor heart block.   She presents today for follow up. Notes occasional lightheadedness with quick position changes. We reviewed orthostatic precautions. No recurrent syncope. Drinks 2 bottles of water per day as well as a cup of coffee in the morning. She is working with physical therapy twice per week. She is planning to return to her part time job working in the [employer] at [employer].   Reports no shortness of breath nor dyspnea on exertion. Reports no chest pain, pressure, or tightness. No edema, orthopnea, PND. Reports no palpitations.    EKGs/Labs/Other Studies Reviewed:   The following studies were reviewed today:  Echo 09/11/22   1. Left ventricular ejection fraction, by estimation, is 60 to 65%. The  left ventricle has normal function. The left ventricle has no regional  wall motion abnormalities. Left ventricular diastolic parameters are  consistent with Grade I diastolic  dysfunction (impaired relaxation).   2. Right ventricular systolic function is normal. The right ventricular  size is normal.   3. The mitral valve is normal in structure. No evidence of mitral valve  regurgitation. No evidence of mitral stenosis.   4. The aortic valve is  tricuspid. There is mild calcification of the  aortic valve. Aortic valve regurgitation is not visualized. Aortic valve  sclerosis is present, with no evidence of aortic valve stenosis. Aortic  valve mean gradient measures 4.0 mmHg.  Aortic valve Vmax measures 1.38 m/s.   5. The inferior vena cava is normal in size with greater than 50%  respiratory variability, suggesting right atrial pressure of 3 mmHg.  EKG:  EKG is not ordered  today.   Recent Labs: 09/10/2022: TSH 1.738 09/20/2022: ALT 15; BUN 18; Creat 0.72; Hemoglobin 13.4; Magnesium 1.9; Platelets 172; Potassium 4.6; Sodium 144  Recent Lipid Panel    Component Value Date/Time   CHOL 168 05/31/2022 1244   TRIG 85 05/31/2022 1244   HDL 65 05/31/2022 1244   CHOLHDL 2.6 05/31/2022 1244   VLDL 19 02/07/2017 1121   LDLCALC 85 05/31/2022 1244    Home Medications   Current Meds  Medication Sig   aspirin 81 MG chewable tablet Chew 81 mg by mouth every evening.   cetirizine (ZYRTEC) 10 MG tablet Takes 1 tablet Daily (Patient taking differently: Take 10 mg by mouth every evening.)   Cholecalciferol 125 MCG (5000 UT) capsule Take 1 capsules Daily (Patient taking differently: Take 5,000 Units by mouth daily.)   Cyanocobalamin (VITAMIN B-12) 1000 MCG SUBL Takes 1 tab SL Daily (Patient taking differently: Take 1 tablet by mouth daily.)   montelukast (SINGULAIR) 10 MG tablet Take  1 tablet  Daily for Allergies                                                            /                            TAKE                      BY                     MOUTH   simvastatin (ZOCOR) 40 MG tablet Take  1 tablet  at Bedtime  for Cholesterol                                             /                                 TAKE                  BY                     MOUTH     Review of Systems    All other systems reviewed and are otherwise negative except as noted above.  Physical Exam    VS:  BP (!) 142/82 (BP Location: Right Arm, Patient Position: Sitting, Cuff Size: Normal)   Pulse 82   Ht 5\' 2"  (1.575 m)   Wt 146 lb (66.2 kg)   BMI 26.70 kg/m  , BMI Body mass index is 26.7 kg/m.  Wt Readings from Last 3 Encounters:  10/09/22 146 lb (66.2 kg)  09/20/22 146 lb (66.2 kg)  09/10/22 143 lb (64.9 kg)     GEN: Well nourished, well developed, in no acute distress. HEENT: normal. Neck: Supple, no JVD, carotid bruits, or masses. Cardiac: RRR, no murmurs, rubs, or gallops. No  clubbing, cyanosis, edema.  Radials/PT 2+ and equal bilaterally.  Respiratory:  Respirations regular and unlabored, clear to auscultation bilaterally. GI: Soft, nontender, nondistended. MS: No deformity or atrophy. Skin: Warm and dry, no rash. Neuro:  Strength and sensation are intact. Psych: Normal affect.  Assessment & Plan    Syncope - Workup during recent admission. Echo 09/2022 normal LVEF, no significant valvular abnormalities. ZIO 09/2022 preliminary report with no heart block nor pause. Occasional episode SVT which was asymptomatic. Beta blocker permanently discontinued during admission. No recurrence, no indication for further workup.   Orthostatic hypotension - Occasional lightheadedness with position changes most c/w orthostatic hypotension. Education provided on orthostatic precautions: Stay well hydrated, eat regular meals, wear compression socks, make position changes slowly.   HTN - BP elevated today in clinic. Usually well controlled at other offices and in recent hospitalization. She is agreeable to purchase home BP cuff and monitor at home. If consistently elevated >140/80 could consider addition of Amlodipine vs Losartan.   Coronary calcification by CT / HLD / Aortic atherosclerosis - Stable with no anginal symptoms. No indication for ischemic evaluation.  Continue Simvastatin.   HYPERTENSION CONTROL Vitals:   10/09/22 0855 10/09/22 0915  BP: (!) 164/88 (!) 142/82    The patient's blood pressure is elevated above target today.  In order to address the patient's elevated BP: Follow up with primary care provider for management.; Follow up with general cardiology has been recommended.; Blood pressure will be monitored at home to determine if medication changes need to be made.       Disposition: Follow up in 6 month(s) with Buford Dresser, MD or APP.  Signed, Loel Dubonnet, NP 10/09/2022, 9:19 AM [place]

## 2022-10-09 NOTE — Patient Instructions (Addendum)
Medication Instructions:  Continue your current medications.   *If you need a refill on your cardiac medications before your next appointment, please call your pharmacy*  Lab Work: None ordered today.   Testing/Procedures:  Your echocardiogram in the hospital showed normal heart muscle function and no significant valvular abnormalities. This is a good result!  Your ZIO monitor showed mostly normal sinus rhythm. There were no significant pauses nor heart blocks which are good. You had an occasional episode of a fast heart rate called SVT (supraventricular tachycardia) which is very common and not of concern. They were only lasting less than 10 seconds each and were infrequent.   Follow-Up: At [facility], you and your health needs are our priority.  As part of our continuing mission to provide you with exceptional heart care, we have created designated Provider Care Teams.  These Care Teams include your primary Cardiologist (physician) and Advanced Practice Providers (APPs -  Physician Assistants and Nurse Practitioners) who all work together to provide you with the care you need, when you need it.  We recommend signing up for the patient portal called "MyChart".  Sign up information is provided on this After Visit Summary.  MyChart is used to connect with patients for Virtual Visits (Telemedicine).  Patients are able to view lab/test results, encounter notes, upcoming appointments, etc.  Non-urgent messages can be sent to your provider as well.   To learn more about what you can do with MyChart, go to [email or web address].    Your next appointment:   6 month(s)  The format for your next appointment:   In Person  Provider:   Buford Dresser, MD or Laurann Montana, NP    Other Instructions  Tips to Measure your Blood Pressure Correctly  Recommend checking blood pressure 2-3 times per week with an upper arm cuff. If your blood pressure is consistently is more than  140/80, please call and let us know.   Here's what you can do to ensure a correct reading:  Don't drink a caffeinated beverage or smoke during the 30 minutes before the test.  Sit quietly for five minutes before the test begins.  During the measurement, sit in a chair with your feet on the floor and your arm supported so your elbow is at about heart level.  The inflatable part of the cuff should completely cover at least 80% of your upper arm, and the cuff should be placed on bare skin, not over a shirt.  Don't talk during the measurement.   Blood pressure categories  Blood pressure category SYSTOLIC (upper number)  DIASTOLIC (lower number)  Normal Less than 120 mm Hg and Less than 80 mm Hg  Elevated 120-129 mm Hg and Less than 80 mm Hg  High blood pressure: Stage 1 hypertension 130-139 mm Hg or 80-89 mm Hg  High blood pressure: Stage 2 hypertension 140 mm Hg or higher or 90 mm Hg or higher  Hypertensive crisis (consult your doctor immediately) Higher than 180 mm Hg and/or Higher than 120 mm Hg  Source: American Heart Association and American Stroke Association. For more on getting your blood pressure under control, buy Controlling Your Blood Pressure, a Special Health Report from [facility].  Blood Pressure Log   Date   Time  Blood Pressure  Example: Nov 1 9 AM 124/78  Heart Healthy Diet Recommendations: A low-salt diet is recommended. Meats should be grilled, baked, or boiled. Avoid fried foods. Focus on lean protein sources like fish or chicken with vegetables and fruits. The American Heart Association is a Microbiologist!  American Heart Association Diet and Lifeystyle Recommendations   Exercise recommendations: The American Heart Association recommends 150 minutes of moderate intensity exercise weekly. Try 30 minutes of moderate intensity exercise 4-5 times per week. This could include walking, jogging,  or swimming.  Orthostatic Hypotension Blood pressure is a measurement of how strongly, or weakly, your circulating blood is pressing against the walls of your arteries. Orthostatic hypotension is a drop in blood pressure that can happen when you change positions, such as when you go from lying down to standing. Arteries are blood vessels that carry blood from your heart throughout your body. When blood pressure is too low, you may not get enough blood to your brain or to the rest of your organs. Orthostatic hypotension can cause light-headedness, sweating, rapid heartbeat, blurred vision, and fainting. These symptoms require further investigation into the cause. What are the causes? Orthostatic hypotension can be caused by many things, including: Sudden changes in posture, such as standing up quickly after you have been sitting or lying down. Loss of blood (anemia) or loss of body fluids (dehydration). Heart problems, neurologic problems, or hormone problems. Pregnancy. Aging. The risk for this condition increases as you get older. Severe infection (sepsis). Certain medicines, such as medicines for high blood pressure or medicines that make the body lose excess fluids (diuretics). What are the signs or symptoms? Symptoms of this condition may include: Weakness, light-headedness, or dizziness. Sweating. Blurred vision. Tiredness (fatigue). Rapid heartbeat. Fainting, in severe cases. How is this diagnosed? This condition is diagnosed based on: Your symptoms and medical history. Your blood pressure measurements. Your health care provider will check your blood pressure when you are: Lying down. Sitting. Standing. A blood pressure reading is recorded as two numbers, such as "120 over 80" (or 120/80). The first ("top") number is called the systolic pressure. It is a measure of the pressure in your arteries as your heart beats. The second ("bottom") number is called the diastolic pressure. It  is a measure of the pressure in your arteries when your heart relaxes between beats. Blood pressure is measured in a unit called mmHg. Healthy blood pressure for most adults is 120/80 mmHg. Orthostatic hypotension is defined as a 20 mmHg drop in systolic pressure or a 10 mmHg drop in diastolic pressure within 3 minutes of standing. Other information or tests that may be used to diagnose orthostatic hypotension include: Your other vital signs, such as your heart rate and temperature. Blood tests. An electrocardiogram (ECG) or echocardiogram. A Holter monitor. This is a device you wear that records your heart rhythm continuously, usually for 24-48 hours. Tilt table test. For this test, you will be safely secured to a table that moves you from a lying position to an upright position. Your heart rhythm and blood pressure will be monitored during the test. How is this treated? This condition may be treated by: Changing your diet. This may involve eating more salt (sodium) or drinking more water. Changing the dosage of certain medicines you are taking that might be lowering your blood pressure. Correcting the underlying reason for the orthostatic hypotension. Wearing compression stockings. Taking medicines to raise your blood pressure. Avoiding actions that trigger symptoms. Follow these instructions at home: Medicines Take over-the-counter and prescription medicines only  as told by your health care provider. Follow instructions from your health care provider about changing the dosage of your current medicines, if this applies. Do not stop or adjust any of your medicines on your own. Eating and drinking  Drink enough fluid to keep your urine pale yellow. Eat extra salt only as directed. Do not add extra salt to your diet unless advised by your health care provider. Eat frequent, [place] meals. Avoid standing up suddenly after eating. General instructions  Get up slowly from lying down or sitting  positions. This gives your blood pressure a chance to adjust. Avoid hot showers and excessive heat as directed by your health care provider. Engage in regular physical activity as directed by your health care provider. If you have compression stockings, wear them as told. Keep all follow-up visits. This is important. Contact a health care provider if: You have a fever for more than 2-3 days. You feel more thirsty than usual. You feel dizzy or weak. Get help right away if: You have chest pain. You have a fast or irregular heartbeat. You become sweaty or feel light-headed. You feel short of breath. You faint. You have any symptoms of a stroke. "BE FAST" is an easy way to remember the main warning signs of a stroke: B - Balance. Signs are dizziness, sudden trouble walking, or loss of balance. E - Eyes. Signs are trouble seeing or a sudden change in vision. F - Face. Signs are sudden weakness or numbness of the face, or the face or eyelid drooping on one side. A - Arms. Signs are weakness or numbness in an arm. This happens suddenly and usually on one side of the body. S - Speech. Signs are sudden trouble speaking, slurred speech, or trouble understanding what people say. T - Time. Time to call emergency services. Write down what time symptoms started. You have other signs of a stroke, such as: A sudden, severe headache with no known cause. Nausea or vomiting. Seizure. These symptoms may represent a serious problem that is an emergency. Do not wait to see if the symptoms will go away. Get medical help right away. Call your local emergency services (911 in the [place] Do not drive yourself to the hospital. Summary Orthostatic hypotension is a sudden drop in blood pressure. It can cause light-headedness, sweating, rapid heartbeat, blurred vision, and fainting. Orthostatic hypotension can be diagnosed by having your blood pressure taken while lying down, sitting, and then standing. Treatment  may involve changing your diet, wearing compression stockings, sitting up slowly, adjusting your medicines, or correcting the underlying reason for the orthostatic hypotension. Get help right away if you have chest pain, a fast or irregular heartbeat, or symptoms of a stroke. This information is not intended to replace advice given to you by your health care provider. Make sure you discuss any questions you have with your health care provider. Document Revised: 02/03/2021 Document Reviewed: 02/03/2021 Elsevier Patient Education  [place].

## 2022-10-10 DIAGNOSIS — J449 Chronic obstructive pulmonary disease, unspecified: Secondary | ICD-10-CM | POA: Diagnosis not present

## 2022-10-10 DIAGNOSIS — I10 Essential (primary) hypertension: Secondary | ICD-10-CM | POA: Diagnosis not present

## 2022-10-10 DIAGNOSIS — J439 Emphysema, unspecified: Secondary | ICD-10-CM | POA: Diagnosis not present

## 2022-10-10 DIAGNOSIS — M810 Age-related osteoporosis without current pathological fracture: Secondary | ICD-10-CM | POA: Diagnosis not present

## 2022-10-10 DIAGNOSIS — E782 Mixed hyperlipidemia: Secondary | ICD-10-CM | POA: Diagnosis not present

## 2022-10-10 DIAGNOSIS — I2510 Atherosclerotic heart disease of native coronary artery without angina pectoris: Secondary | ICD-10-CM | POA: Diagnosis not present

## 2022-10-10 DIAGNOSIS — I700 Atherosclerosis of aorta: Secondary | ICD-10-CM | POA: Diagnosis not present

## 2022-10-10 DIAGNOSIS — Z8616 Personal history of COVID-19: Secondary | ICD-10-CM | POA: Diagnosis not present

## 2022-10-10 DIAGNOSIS — E876 Hypokalemia: Secondary | ICD-10-CM | POA: Diagnosis not present

## 2022-10-10 DIAGNOSIS — Z7982 Long term (current) use of aspirin: Secondary | ICD-10-CM | POA: Diagnosis not present

## 2022-10-10 DIAGNOSIS — E538 Deficiency of other specified B group vitamins: Secondary | ICD-10-CM | POA: Diagnosis not present

## 2022-10-10 DIAGNOSIS — Z9181 History of falling: Secondary | ICD-10-CM | POA: Diagnosis not present

## 2022-10-10 DIAGNOSIS — Z85118 Personal history of other malignant neoplasm of bronchus and lung: Secondary | ICD-10-CM | POA: Diagnosis not present

## 2022-10-10 DIAGNOSIS — E559 Vitamin D deficiency, unspecified: Secondary | ICD-10-CM | POA: Diagnosis not present

## 2022-10-12 DIAGNOSIS — J449 Chronic obstructive pulmonary disease, unspecified: Secondary | ICD-10-CM | POA: Diagnosis not present

## 2022-10-12 DIAGNOSIS — I2510 Atherosclerotic heart disease of native coronary artery without angina pectoris: Secondary | ICD-10-CM | POA: Diagnosis not present

## 2022-10-12 DIAGNOSIS — J439 Emphysema, unspecified: Secondary | ICD-10-CM | POA: Diagnosis not present

## 2022-10-12 DIAGNOSIS — E782 Mixed hyperlipidemia: Secondary | ICD-10-CM | POA: Diagnosis not present

## 2022-10-12 DIAGNOSIS — Z85118 Personal history of other malignant neoplasm of bronchus and lung: Secondary | ICD-10-CM | POA: Diagnosis not present

## 2022-10-12 DIAGNOSIS — I10 Essential (primary) hypertension: Secondary | ICD-10-CM | POA: Diagnosis not present

## 2022-10-12 DIAGNOSIS — E538 Deficiency of other specified B group vitamins: Secondary | ICD-10-CM | POA: Diagnosis not present

## 2022-10-12 DIAGNOSIS — E559 Vitamin D deficiency, unspecified: Secondary | ICD-10-CM | POA: Diagnosis not present

## 2022-10-12 DIAGNOSIS — Z9181 History of falling: Secondary | ICD-10-CM | POA: Diagnosis not present

## 2022-10-12 DIAGNOSIS — Z8616 Personal history of COVID-19: Secondary | ICD-10-CM | POA: Diagnosis not present

## 2022-10-12 DIAGNOSIS — I700 Atherosclerosis of aorta: Secondary | ICD-10-CM | POA: Diagnosis not present

## 2022-10-12 DIAGNOSIS — Z7982 Long term (current) use of aspirin: Secondary | ICD-10-CM | POA: Diagnosis not present

## 2022-10-12 DIAGNOSIS — M810 Age-related osteoporosis without current pathological fracture: Secondary | ICD-10-CM | POA: Diagnosis not present

## 2022-10-12 DIAGNOSIS — E876 Hypokalemia: Secondary | ICD-10-CM | POA: Diagnosis not present

## 2022-10-18 DIAGNOSIS — J439 Emphysema, unspecified: Secondary | ICD-10-CM | POA: Diagnosis not present

## 2022-10-18 DIAGNOSIS — I2510 Atherosclerotic heart disease of native coronary artery without angina pectoris: Secondary | ICD-10-CM | POA: Diagnosis not present

## 2022-10-18 DIAGNOSIS — E782 Mixed hyperlipidemia: Secondary | ICD-10-CM | POA: Diagnosis not present

## 2022-10-18 DIAGNOSIS — Z9181 History of falling: Secondary | ICD-10-CM | POA: Diagnosis not present

## 2022-10-18 DIAGNOSIS — Z7982 Long term (current) use of aspirin: Secondary | ICD-10-CM | POA: Diagnosis not present

## 2022-10-18 DIAGNOSIS — M810 Age-related osteoporosis without current pathological fracture: Secondary | ICD-10-CM | POA: Diagnosis not present

## 2022-10-18 DIAGNOSIS — J449 Chronic obstructive pulmonary disease, unspecified: Secondary | ICD-10-CM | POA: Diagnosis not present

## 2022-10-18 DIAGNOSIS — Z8616 Personal history of COVID-19: Secondary | ICD-10-CM | POA: Diagnosis not present

## 2022-10-18 DIAGNOSIS — E876 Hypokalemia: Secondary | ICD-10-CM | POA: Diagnosis not present

## 2022-10-18 DIAGNOSIS — Z85118 Personal history of other malignant neoplasm of bronchus and lung: Secondary | ICD-10-CM | POA: Diagnosis not present

## 2022-10-18 DIAGNOSIS — E538 Deficiency of other specified B group vitamins: Secondary | ICD-10-CM | POA: Diagnosis not present

## 2022-10-18 DIAGNOSIS — I10 Essential (primary) hypertension: Secondary | ICD-10-CM | POA: Diagnosis not present

## 2022-10-18 DIAGNOSIS — E559 Vitamin D deficiency, unspecified: Secondary | ICD-10-CM | POA: Diagnosis not present

## 2022-10-18 DIAGNOSIS — I700 Atherosclerosis of aorta: Secondary | ICD-10-CM | POA: Diagnosis not present

## 2022-11-02 NOTE — Progress Notes (Signed)
Assessment and Plan:  Penny [name] was seen today for memory loss.  Diagnoses and all orders for this visit:  Essential hypertension - continue medications, DASH diet, exercise and monitor at home. Call if greater than 130/80.  If BP is lower than 100/60 notify the office -     CBC with Differential/Platelet -     COMPLETE METABOLIC PANEL WITH GFR -     hydrochlorothiazide (HYDRODIURIL) 12.5 MG tablet; Take 1 tablet (12.5 mg total) by mouth daily.  Difficulty urinating Increase fluid intake and monitor symptoms -     Urinalysis, Routine w reflex microscopic -     Urine Culture  Memory difficulty/Anxiety Start Buspar 5 mg BID as needed Given memory strategies Scored 29/30 on MMSE -     busPIRone (BUSPAR) 5 MG tablet; Take 1 tablet (5 mg total) by mouth 2 (two) times daily.       Further disposition pending results of labs. Discussed med's effects and SE's.   Over 30 minutes of exam, counseling, chart review, and critical decision making was performed.   Future Appointments  Date Time Provider [place]  11/07/2022  9:00 AM [facility] MM 2 [facility]  01/03/2023  9:30 AM Unk Pinto, MD [facility] None  04/11/2023  9:00 AM Alycia Rossetti, NP [facility] None  04/13/2023 10:00 AM Buford Dresser, MD [facility]    ------------------------------------------------------------------------------------------------------------------   HPI BP 136/80   Pulse 84   Temp 98.2 F (36.8 C)   Resp 16   Ht _0  (1.575 m)   Wt 144 lb 9.6 oz (65.6 kg)   SpO2 96%   BMI 26.45 kg/m   88 y.o.female presents for memory changes. Notices difficulty remembering names and numbers. Has been having more anxiety due to not being able to remember information. Once she can not remember a name or find something she is looking for her anxiety increases.  Currently on no BP medication. Will have some dizziness with change of position. She has also been noticing swelling in her  feet since stopping HCTZ. Has an occasional headache but is usually when she is under increased stress.  BP Readings from Last 3 Encounters:  11/06/22 136/80  10/09/22 (!) 142/82  09/20/22 138/72     She has not been drinking as much water as she should Lab Results  Component Value Date   EGFR 81 09/20/2022     Past Medical History:  Diagnosis Date   COPD (chronic obstructive pulmonary disease) with emphysema ([place])    COVID-19 12/09/2019   Hyperlipidemia    Hypertension    Lung cancer, upper lobe ([place]) 12/04/1996   RUL   Osteoporosis    S/P partial lobectomy of lung    [id number]   Thigh shingles 12/04/1996   Thoracic aorta atherosclerosis ([facility])    Vitamin D deficiency      Allergies  Allergen Reactions   Ace Inhibitors Cough   Calcium-Containing Compounds Other (See Comments)    constpation   Lactose Intolerance (Gi) Diarrhea   Polysporin [Bacitracin-Polymyxin B] Rash    Polysporin or neosporin gave her a rash, couldn't remember which    Current Outpatient Medications on File Prior to Visit  Medication Sig   aspirin 81 MG chewable tablet Chew 81 mg by mouth every evening.   cetirizine (ZYRTEC) 10 MG tablet Takes 1 tablet Daily (Patient taking differently: Take 10 mg by mouth every evening.)   Cholecalciferol 125 MCG (5000 UT) capsule Take 1 capsules Daily (Patient taking differently: Take  5,000 Units by mouth daily.)   Cyanocobalamin (VITAMIN B-12) 1000 MCG SUBL Takes 1 tab SL Daily (Patient taking differently: Take 1 tablet by mouth daily.)   montelukast (SINGULAIR) 10 MG tablet Take  1 tablet  Daily for Allergies                                                            /                            TAKE                      BY                     MOUTH   simvastatin (ZOCOR) 40 MG tablet Take  1 tablet  at Bedtime  for Cholesterol                                             /                                 TAKE                  BY                     MOUTH   No  current facility-administered medications on file prior to visit.    ROS: all negative except above.   Physical Exam:  BP 136/80   Pulse 84   Temp 98.2 F (36.8 C)   Resp 16   Ht _0  (1.575 m)   Wt 144 lb 9.6 oz (65.6 kg)   SpO2 96%   BMI 26.45 kg/m   General Appearance: Well nourished, in no apparent distress. Eyes: PERRLA, EOMs, conjunctiva no swelling or erythema Sinuses: No Frontal/maxillary tenderness ENT/Mouth: Ext aud canals clear, TMs without erythema, bulging. No erythema, swelling, or exudate on post pharynx.  Tonsils not swollen or erythematous. Hearing normal.  Neck: Supple, thyroid normal.  Respiratory: Respiratory effort normal, BS equal bilaterally without rales, rhonchi, wheezing or stridor.  Cardio: RRR with no MRGs. Brisk peripheral pulses with 1+ nonpitting edema Abdomen: Soft, + BS.  Non tender, no guarding, rebound, hernias, masses. Lymphatics: Non tender without lymphadenopathy.  Musculoskeletal: Full ROM, 5/5 strength, normal gait.  Skin: Warm, dry without rashes, lesions, ecchymosis.  Neuro: Cranial nerves intact. Normal muscle tone, no cerebellar symptoms. Sensation intact.  Psych: Awake and oriented X 3, normal affect, Insight and Judgment appropriate.  MMSE Lane, NP 4:29 PM Penny [name] ([facility]

## 2022-11-06 ENCOUNTER — Ambulatory Visit: Payer: Medicare Other | Admitting: Internal Medicine

## 2022-11-06 ENCOUNTER — Ambulatory Visit (INDEPENDENT_AMBULATORY_CARE_PROVIDER_SITE_OTHER): Payer: Medicare Other | Admitting: Nurse Practitioner

## 2022-11-06 ENCOUNTER — Encounter: Payer: Self-pay | Admitting: Nurse Practitioner

## 2022-11-06 VITALS — BP 136/80 | HR 84 | Temp 98.2°F | Resp 16 | Ht 62.0 in | Wt 144.6 lb

## 2022-11-06 DIAGNOSIS — F419 Anxiety disorder, unspecified: Secondary | ICD-10-CM

## 2022-11-06 DIAGNOSIS — R39198 Other difficulties with micturition: Secondary | ICD-10-CM

## 2022-11-06 DIAGNOSIS — R413 Other amnesia: Secondary | ICD-10-CM

## 2022-11-06 DIAGNOSIS — I10 Essential (primary) hypertension: Secondary | ICD-10-CM

## 2022-11-06 MED ORDER — HYDROCHLOROTHIAZIDE 12.5 MG PO TABS: 12.5000 mg | ORAL_TABLET | Freq: Every day | ORAL | 3 refills | Status: DC

## 2022-11-06 MED ORDER — BUSPIRONE HCL 5 MG PO TABS: 5.0000 mg | ORAL_TABLET | Freq: Two times a day (BID) | ORAL | 0 refills | Status: DC

## 2022-11-06 NOTE — Patient Instructions (Addendum)
Buspirone Tablets What is this medication? BUSPIRONE (byoo SPYE rone) treats anxiety. It works by balancing the levels of dopamine and serotonin in your brain, substances that help regulate mood. This medicine may be used for other purposes; ask your health care provider or pharmacist if you have questions. COMMON BRAND NAME(S): BuSpar, Buspar Dividose What should I tell my care team before I take this medication? They need to know if you have any of these conditions: Kidney or liver disease An unusual or allergic reaction to buspirone, other medications, foods, dyes, or preservatives Pregnant or trying to get pregnant Breast-feeding How should I use this medication? Take this medication by mouth with a glass of water. Follow the directions on the prescription label. You may take this medication with or without food. To ensure that this medication always works the same way for you, you should take it either always with or always without food. Take your doses at regular intervals. Do not take your medication more often than directed. Do not stop taking except on the advice of your care team. Talk to your care team about the use of this medication in children. Special care may be needed. Overdosage: If you think you have taken too much of this medicine contact a poison control center or emergency room at once. NOTE: This medicine is only for you. Do not share this medicine with others. What if I miss a dose? If you miss a dose, take it as soon as you can. If it is almost time for your next dose, take only that dose. Do not take double or extra doses. What may interact with this medication? Do not take this medication with any of the following: Linezolid MAOIs like Carbex, Eldepryl, Marplan, Nardil, and Parnate Methylene blue Procarbazine This medication may also interact with the following: Diazepam Digoxin Diltiazem Erythromycin Grapefruit juice Haloperidol Medications for mental  depression or mood problems Medications for seizures like carbamazepine, phenobarbital and phenytoin Nefazodone Other medications for anxiety Rifampin Ritonavir Some antifungal medications like itraconazole, ketoconazole, and voriconazole Verapamil Warfarin This list may not describe all possible interactions. Give your health care provider a list of all the medicines, herbs, non-prescription drugs, or dietary supplements you use. Also tell them if you smoke, drink alcohol, or use illegal drugs. Some items may interact with your medicine. What should I watch for while using this medication? Visit your care team for regular checks on your progress. It may take 1 to 2 weeks before your anxiety gets better. This medication may affect your coordination, reaction time, or judgment. Do not drive or operate machinery until you know how this medication affects you. Sit up or stand slowly to reduce the risk of dizzy or fainting spells. Drinking alcohol with this medication can increase the risk of these side effects. What side effects may I notice from receiving this medication? Side effects that you should report to your care team as soon as possible: Allergic reactions--skin rash, itching, hives, swelling of the face, lips, tongue, or throat Irritability, confusion, fast or irregular heartbeat, muscle stiffness, twitching muscles, sweating, high fever, seizure, chills, vomiting, diarrhea, which may be signs of serotonin syndrome Side effects that usually do not require medical attention (report to your care team if they continue or are bothersome): Anxiety, nervousness Dizziness Drowsiness Headache Nausea Trouble sleeping This list may not describe all possible side effects. Call your doctor for medical advice about side effects. You may report side effects to FDA at 1-800-FDA-1088. Where should I keep  my medication? Keep out of the reach of children. Store at room temperature below 30 degrees C  (86 degrees F). Protect from light. Keep container tightly closed. Throw away any unused medication after the expiration date. NOTE: This sheet is a summary. It may not cover all possible information. If you have questions about this medicine, talk to your doctor, pharmacist, or health care provider.  2023 Elsevier/Gold Standard (2008-01-11 00:00:00)    Mind exercises Switch between Sudoku, crossword puzzle, word search, jigsaw   Management of Memory Problems  There are some general things you can do to help manage your memory problems.  Your memory may not in fact recover, but by using techniques and strategies you will be able to manage your memory difficulties better.  1)  Establish a routine. Try to establish and then stick to a regular routine.  By doing this, you will get used to what to expect and you will reduce the need to rely on your memory.  Also, try to do things at the same time of day, such as taking your medication or checking your calendar first thing in the morning. Think about think that you can do as a part of a regular routine and make a list.  Then enter them into a daily planner to remind you.  This will help you establish a routine.  2)  Organize your environment. Organize your environment so that it is uncluttered.  Decrease visual stimulation.  Place everyday items such as keys or cell phone in the same place every day (ie.  Basket next to front door) Use post it notes with a brief message to yourself (ie. Turn off light, lock the door) Use labels to indicate where things go (ie. Which cupboards are for food, dishes, etc.) Keep a notepad and pen by the telephone to take messages  3)  Memory Aids A diary or journal/notebook/daily planner Making a list (shopping list, chore list, to do list that needs to be done) Using an alarm as a reminder (kitchen timer or cell phone alarm) Using cell phone to store information (Notes, Calendar, Reminders) Calendar/White board  placed in a prominent position Post-it notes  In order for memory aids to be useful, you need to have good habits.  It's no good remembering to make a note in your journal if you don't remember to look in it.  Try setting aside a certain time of day to look in journal.  4)  Improving mood and managing fatigue. There may be other factors that contribute to memory difficulties.  Factors, such as anxiety, depression and tiredness can affect memory. Regular gentle exercise can help improve your mood and give you more energy. Simple relaxation techniques may help relieve symptoms of anxiety Try to get back to completing activities or hobbies you enjoyed doing in the past. Learn to pace yourself through activities to decrease fatigue. Find out about some local support groups where you can share experiences with others. Try and achieve 7-8 hours of sleep at night.  Memory Compensation Strategies  Use "WARM" strategy.  W= write it down  A= associate it  R= repeat it  M= make a mental note  2.   You can keep a Social worker.  Use a 3-ring notebook with sections for the following: calendar, important names and phone numbers,  medications, doctors' names/phone numbers, lists/reminders, and a section to journal what you did  each day.   3.    Use a calendar to write appointments  down.  4.    Write yourself a schedule for the day.  This can be placed on the calendar or in a separate section of the Memory Notebook.  Keeping a  regular schedule can help memory.  5.    Use medication organizer with sections for each day or morning/evening pills.  You may need help loading it  6.    Keep a basket, or pegboard by the door.  Place items that you need to take out with you in the basket or on the pegboard.  You may also want to  include a message board for reminders.  7.    Use sticky notes.  Place sticky notes with reminders in a place where the task is performed.  For example: " turn off the   stove" placed by the stove, "lock the door" placed on the door at eye level, " take your medications" on  the bathroom mirror or by the place where you normally take your medications.  8.    Use alarms/timers.  Use while cooking to remind yourself to check on food or as a reminder to take your medicine, or as a  reminder to make a call, or as a reminder to perform another task, etc.

## 2022-11-07 ENCOUNTER — Ambulatory Visit
Admission: RE | Admit: 2022-11-07 | Discharge: 2022-11-07 | Disposition: A | Discharge status: Home / Self Care | Payer: Medicare Other | Source: Ambulatory Visit | Attending: Internal Medicine | Admitting: Internal Medicine

## 2022-11-07 DIAGNOSIS — Z1231 Encounter for screening mammogram for malignant neoplasm of breast: Secondary | ICD-10-CM | POA: Diagnosis not present

## 2022-11-07 LAB — CBC WITH DIFFERENTIAL/PLATELET
Absolute Monocytes: 590 cells/uL (ref 200–950)
Basophils Absolute: 8 cells/uL (ref 0–200)
Basophils Relative: 0.2 %
Eosinophils Absolute: 98 cells/uL (ref 15–500)
Eosinophils Relative: 2.4 %
HCT: 39.2 % (ref 35.0–45.0)
Hemoglobin: 12.6 g/dL (ref 11.7–15.5)
Lymphs Abs: 1312 cells/uL (ref 850–3900)
MCH: 27.8 pg (ref 27.0–33.0)
MCHC: 32.1 g/dL (ref 32.0–36.0)
MCV: 86.3 fL (ref 80.0–100.0)
MPV: 10.6 fL (ref 7.5–12.5)
Monocytes Relative: 14.4 %
Neutro Abs: 2091 cells/uL (ref 1500–7800)
Neutrophils Relative %: 51 %
Platelets: 165 10*3/uL (ref 140–400)
RBC: 4.54 10*6/uL (ref 3.80–5.10)
RDW: 12.6 % (ref 11.0–15.0)
Total Lymphocyte: 32 %
WBC: 4.1 10*3/uL (ref 3.8–10.8)

## 2022-11-07 LAB — COMPLETE METABOLIC PANEL WITH GFR
AG Ratio: 1.8 (calc) (ref 1.0–2.5)
ALT: 15 U/L (ref 6–29)
AST: 19 U/L (ref 10–35)
Albumin: 4.4 g/dL (ref 3.6–5.1)
Alkaline phosphatase (APISO): 60 U/L (ref 37–153)
BUN: 17 mg/dL (ref 7–25)
CO2: 31 mmol/L (ref 20–32)
Calcium: 9.6 mg/dL (ref 8.6–10.4)
Chloride: 106 mmol/L (ref 98–110)
Creat: 0.78 mg/dL (ref 0.60–0.95)
Globulin: 2.5 g/dL (calc) (ref 1.9–3.7)
Glucose, Bld: 92 mg/dL (ref 65–99)
Potassium: 4.2 mmol/L (ref 3.5–5.3)
Sodium: 144 mmol/L (ref 135–146)
Total Bilirubin: 0.4 mg/dL (ref 0.2–1.2)
Total Protein: 6.9 g/dL (ref 6.1–8.1)
eGFR: 73 mL/min/{1.73_m2} (ref >=60)

## 2022-11-07 LAB — URINE CULTURE
MICRO NUMBER:: 14265833
SPECIMEN QUALITY:: ADEQUATE

## 2022-11-07 LAB — URINALYSIS, ROUTINE W REFLEX MICROSCOPIC
Bilirubin Urine: NEGATIVE
Glucose, UA: NEGATIVE
Hgb urine dipstick: NEGATIVE
Ketones, ur: NEGATIVE
Nitrite: NEGATIVE
Protein, ur: NEGATIVE
Specific Gravity, Urine: 1.011 (ref 1.001–1.035)
pH: 5.5 (ref 5.0–8.0)

## 2022-11-07 LAB — MICROSCOPIC MESSAGE

## 2022-11-13 ENCOUNTER — Emergency Department (HOSPITAL_COMMUNITY): Payer: Medicare Other

## 2022-11-13 ENCOUNTER — Emergency Department (HOSPITAL_COMMUNITY)
Admission: EM | Admit: 2022-11-13 | Discharge: 2022-11-13 | Disposition: A | Discharge status: Home / Self Care | Payer: Medicare Other | Attending: Emergency Medicine | Admitting: Emergency Medicine

## 2022-11-13 ENCOUNTER — Other Ambulatory Visit: Payer: Self-pay

## 2022-11-13 ENCOUNTER — Encounter (HOSPITAL_COMMUNITY): Payer: Self-pay

## 2022-11-13 DIAGNOSIS — W19XXXA Unspecified fall, initial encounter: Secondary | ICD-10-CM | POA: Diagnosis not present

## 2022-11-13 DIAGNOSIS — Z79899 Other long term (current) drug therapy: Secondary | ICD-10-CM | POA: Insufficient documentation

## 2022-11-13 DIAGNOSIS — W01198A Fall on same level from slipping, tripping and stumbling with subsequent striking against other object, initial encounter: Secondary | ICD-10-CM | POA: Insufficient documentation

## 2022-11-13 DIAGNOSIS — Z7982 Long term (current) use of aspirin: Secondary | ICD-10-CM | POA: Diagnosis not present

## 2022-11-13 DIAGNOSIS — R55 Syncope and collapse: Secondary | ICD-10-CM | POA: Diagnosis not present

## 2022-11-13 DIAGNOSIS — S0990XA Unspecified injury of head, initial encounter: Secondary | ICD-10-CM | POA: Diagnosis not present

## 2022-11-13 DIAGNOSIS — Z743 Need for continuous supervision: Secondary | ICD-10-CM | POA: Diagnosis not present

## 2022-11-13 DIAGNOSIS — Y990 Civilian activity done for income or pay: Secondary | ICD-10-CM | POA: Diagnosis not present

## 2022-11-13 DIAGNOSIS — G4489 Other headache syndrome: Secondary | ICD-10-CM | POA: Diagnosis not present

## 2022-11-13 DIAGNOSIS — I10 Essential (primary) hypertension: Secondary | ICD-10-CM | POA: Diagnosis not present

## 2022-11-13 DIAGNOSIS — R42 Dizziness and giddiness: Secondary | ICD-10-CM | POA: Diagnosis not present

## 2022-11-13 DIAGNOSIS — S2231XA Fracture of one rib, right side, initial encounter for closed fracture: Secondary | ICD-10-CM | POA: Diagnosis not present

## 2022-11-13 DIAGNOSIS — I2510 Atherosclerotic heart disease of native coronary artery without angina pectoris: Secondary | ICD-10-CM | POA: Insufficient documentation

## 2022-11-13 DIAGNOSIS — M47812 Spondylosis without myelopathy or radiculopathy, cervical region: Secondary | ICD-10-CM | POA: Diagnosis not present

## 2022-11-13 DIAGNOSIS — R404 Transient alteration of awareness: Secondary | ICD-10-CM | POA: Diagnosis not present

## 2022-11-13 DIAGNOSIS — I739 Peripheral vascular disease, unspecified: Secondary | ICD-10-CM | POA: Diagnosis not present

## 2022-11-13 LAB — COMPREHENSIVE METABOLIC PANEL
ALT: 15 U/L (ref 0–44)
AST: 28 U/L (ref 15–41)
Albumin: 4.2 g/dL (ref 3.5–5.0)
Alkaline Phosphatase: 57 U/L (ref 38–126)
Anion gap: 14 (ref 5–15)
BUN: 21 mg/dL (ref 8–23)
CO2: 24 mmol/L (ref 22–32)
Calcium: 9.9 mg/dL (ref 8.9–10.3)
Chloride: 104 mmol/L (ref 98–111)
Creatinine, Ser: 0.72 mg/dL (ref 0.44–1.00)
GFR, Estimated: >60 mL/min (ref >60)
Glucose, Bld: 111 mg/dL — ABNORMAL HIGH (ref 70–99)
Potassium: 3.7 mmol/L (ref 3.5–5.1)
Sodium: 142 mmol/L (ref 135–145)
Total Bilirubin: 0.9 mg/dL (ref 0.3–1.2)
Total Protein: 6.9 g/dL (ref 6.5–8.1)

## 2022-11-13 LAB — CBC WITH DIFFERENTIAL/PLATELET
Abs Immature Granulocytes: 0.03 10*3/uL (ref 0.00–0.07)
Basophils Absolute: 0 10*3/uL (ref 0.0–0.1)
Basophils Relative: 1 %
Eosinophils Absolute: 0 10*3/uL (ref 0.0–0.5)
Eosinophils Relative: 1 %
HCT: 46.1 % — ABNORMAL HIGH (ref 36.0–46.0)
Hemoglobin: 14.3 g/dL (ref 12.0–15.0)
Immature Granulocytes: 0 %
Lymphocytes Relative: 18 %
Lymphs Abs: 1.3 10*3/uL (ref 0.7–4.0)
MCH: 27.8 pg (ref 26.0–34.0)
MCHC: 31 g/dL (ref 30.0–36.0)
MCV: 89.7 fL (ref 80.0–100.0)
Monocytes Absolute: 0.6 10*3/uL (ref 0.1–1.0)
Monocytes Relative: 8 %
Neutro Abs: 5.4 10*3/uL (ref 1.7–7.7)
Neutrophils Relative %: 72 %
Platelets: 171 10*3/uL (ref 150–400)
RBC: 5.14 MIL/uL — ABNORMAL HIGH (ref 3.87–5.11)
RDW: 13.2 % (ref 11.5–15.5)
WBC: 7.4 10*3/uL (ref 4.0–10.5)
nRBC: 0 % (ref 0.0–0.2)

## 2022-11-13 LAB — URINALYSIS, ROUTINE W REFLEX MICROSCOPIC
Bacteria, UA: NONE SEEN
Bilirubin Urine: NEGATIVE
Glucose, UA: NEGATIVE mg/dL
Ketones, ur: NEGATIVE mg/dL
Leukocytes,Ua: NEGATIVE
Nitrite: NEGATIVE
Protein, ur: NEGATIVE mg/dL
Specific Gravity, Urine: 1.005 (ref 1.005–1.030)
pH: 6 (ref 5.0–8.0)

## 2022-11-13 NOTE — ED Provider Notes (Signed)
[facility] EMERGENCY DEPARTMENT Provider Note   CSN: [id number] Arrival date & time: 11/13/22  1315     History  Chief Complaint  Patient presents with   Syncopal Event    Penny [name] is a 88 y.o. female.  HPI The patient is a 88 year old female with past medical history of HTN, CAD, syncope, presenting by EMS from work for syncopal episode.  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  Per bystanders on the scene, the patient just collapsed hitting her head.  She had a brief loss of consciousness with rapid return to baseline.  She reportedly struck the back of her head at the occiput.  The patient was hemodynamically stable throughout transport with EMS.  She is reporting mild pain in the back of her head where she impacted but has no other concerns at this time.  She denies recent fevers, nausea, vomiting, chest pain, shortness of breath, abdominal pain, urinary symptoms.  She does state that she has had recent changes to her metoprolol (recently discontinued for syncope) and her hydrochlorothiazide (recently cut in half) due to falls.     Home Medications Prior to Admission medications   Medication Sig Start Date End Date Taking? Authorizing Provider  aspirin 81 MG chewable tablet Chew 81 mg by mouth every evening.    [provider]  busPIRone (BUSPAR) 5 MG tablet Take 1 tablet (5 mg total) by mouth 2 (two) times daily. 11/06/22   Alycia Rossetti, NP  cetirizine (ZYRTEC) 10 MG tablet Takes 1 tablet Daily Patient taking differently: Take 10 mg by mouth every evening. 05/02/19   Unk Pinto, MD  Cholecalciferol 125 MCG (5000 UT) capsule Take 1 capsules Daily Patient taking differently: Take 5,000 Units by mouth daily. 12/01/21   Liane Comber, NP   Cyanocobalamin (VITAMIN B-12) 1000 MCG SUBL Takes 1 tab SL Daily Patient taking differently: Take 1 tablet by mouth daily. 05/02/19   Unk Pinto, MD  hydrochlorothiazide (HYDRODIURIL) 12.5 MG tablet Take 1 tablet (12.5 mg total) by mouth daily. 11/06/22   Alycia Rossetti, NP  montelukast (SINGULAIR) 10 MG tablet Take  1 tablet  Daily for Allergies                                                            /                            TAKE                      BY                     MOUTH 02/26/22   Unk Pinto, MD  simvastatin (ZOCOR) 40 MG tablet Take  1 tablet  at Bedtime  for Cholesterol                                             /  TAKE                  BY                     MOUTH 05/03/22   Unk Pinto, MD      Allergies    Ace inhibitors, Calcium-containing compounds, Lactose intolerance (gi), and Polysporin [bacitracin-polymyxin b]    Review of Systems   Review of Systems  See HPI  Physical Exam Updated Vital Signs BP (!) 162/87 (BP Location: Left Arm)   Pulse 94   Temp 97.6 F (36.4 C) (Oral)   Resp 18   SpO2 96%  Physical Exam Vitals and nursing note reviewed.  Constitutional:      General: She is not in acute distress.    Appearance: She is well-developed.  HENT:     Head: Normocephalic and atraumatic.     Mouth/Throat:     Mouth: Mucous membranes are moist.  Eyes:     Conjunctiva/sclera: Conjunctivae normal.  Neck:     Comments: C-collar in place no C/T/L-spine tenderness to palpation Cardiovascular:     Rate and Rhythm: Normal rate and regular rhythm.     Heart sounds: No murmur heard. Pulmonary:     Effort: Pulmonary effort is normal. No respiratory distress.     Breath sounds: Normal breath sounds.  Abdominal:     Palpations: Abdomen is soft.     Tenderness: There is no abdominal tenderness.  Musculoskeletal:        General: No swelling.  Skin:    General: Skin is warm and dry.     Capillary Refill:  Capillary refill takes less than 2 seconds.  Neurological:     General: No focal deficit present.     Mental Status: She is alert and oriented to person, place, and time.     Cranial Nerves: No cranial nerve deficit.     Sensory: No sensory deficit.     Motor: No weakness.     Coordination: Coordination normal.  Psychiatric:        Mood and Affect: Mood normal.       ED Results / Procedures / Treatments   Labs (all labs ordered are listed, but only abnormal results are displayed) Labs Reviewed  COMPREHENSIVE METABOLIC PANEL - Abnormal; Notable for the following components:      Result Value   Glucose, Bld 111 (*)    All other components within normal limits  CBC WITH DIFFERENTIAL/PLATELET - Abnormal; Notable for the following components:   RBC 5.14 (*)    HCT 46.1 (*)    All other components within normal limits  URINALYSIS, ROUTINE W REFLEX MICROSCOPIC - Abnormal; Notable for the following components:   Color, Urine STRAW (*)    Hgb urine dipstick Picinich (*)    All other components within normal limits    EKG EKG Interpretation  Date/Time:  Monday November 13 2022 15:02:36 EST Ventricular Rate:  96 PR Interval:  180 QRS Duration: 80 QT Interval:  354 QTC Calculation: 447 R Axis:   8 Text Interpretation: Sinus rhythm with occasional Premature ventricular complexes Possible Left atrial enlargement Cannot rule out Inferior infarct , age undetermined Anterior infarct , age undetermined Abnormal ECG When compared with ECG of 10-Sep-2022 17:54, PREVIOUS ECG IS PRESENT Confirmed by Elnora Morrison [phone]) on 11/13/2022 3:14:54 PM  Radiology DG Chest Portable 1 View  Result Date: 11/13/2022 CLINICAL DATA:  Syncope, fall EXAM: PORTABLE CHEST 1 VIEW COMPARISON:  09/10/2022 FINDINGS: Normal heart size and vascularity. Similar aortic atherosclerosis and tortuosity. Negative for pneumonia, CHF, effusion or pneumothorax. Bones are osteopenic. Degenerative changes of the spine and  shoulders. Old posterior right rib fractures noted. IMPRESSION: 1. No acute chest process. 2. Aortic atherosclerosis. Electronically Signed   By: Jerilynn Mages.  Shick M.D.   On: 11/13/2022 15:05   CT Head Wo Contrast  Result Date: 11/13/2022 CLINICAL DATA:  Trauma EXAM: CT HEAD WITHOUT CONTRAST TECHNIQUE: Contiguous axial images were obtained from the base of the skull through the vertex without intravenous contrast. RADIATION DOSE REDUCTION: This exam was performed according to the departmental dose-optimization program which includes automated exposure control, adjustment of the mA and/or kV according to patient size and/or use of iterative reconstruction technique. COMPARISON:  10/20/2014 FINDINGS: Brain: No acute intracranial findings are seen. Symmetric high density foci are noted in basal ganglia on both sides suggesting calcifications. Cortical sulci are prominent. There is decreased density in periventricular and subcortical white matter. Vascular: Unremarkable. Skull: Unremarkable. Sinuses/Orbits: Unremarkable. Other: None. IMPRESSION: No acute intracranial findings are seen. High density is seen in basal ganglia on both sides, most likely basal ganglia calcifications. There is no adjacent edema or mass effect. Atrophy. Dondero-vessel disease. Electronically Signed   By: Elmer Picker M.D.   On: 11/13/2022 14:42   CT Cervical Spine Wo Contrast  Result Date: 11/13/2022 CLINICAL DATA:  Trauma EXAM: CT CERVICAL SPINE WITHOUT CONTRAST TECHNIQUE: Multidetector CT imaging of the cervical spine was performed without intravenous contrast. Multiplanar CT image reconstructions were also generated. RADIATION DOSE REDUCTION: This exam was performed according to the departmental dose-optimization program which includes automated exposure control, adjustment of the mA and/or kV according to patient size and/or use of iterative reconstruction technique. COMPARISON:  None Available. FINDINGS: Alignment: Alignment of  posterior margins of vertebral bodies is unremarkable. There is mild dextroscoliosis. Skull base and vertebrae: No recent fracture is seen. Degenerative changes are noted with disc space narrowing, bony spurs and encroachment of neural foramina at multiple levels. Soft tissues and spinal canal: Posterior bony spurs are causing extrinsic pressure over the ventral margin of thecal sac at multiple levels. There is mild spinal stenosis at C5-C6 level. Disc levels: There is encroachment of neural foramina from C2-C7 levels. Upper chest: Arterial calcifications are seen in aortic arch and its major branches. Breathing motion limits evaluation of upper lung fields. As far as seen, there is no focal infiltrate in the upper lung fields. Few blebs and bullae are seen in the right apex. Other: None. IMPRESSION: No recent fracture is seen in cervical spine. Cervical spondylosis with extrinsic pressure over the ventral margin of thecal sac caused by posterior bony spurs at multiple levels, more so at C5-C6 level on the right side. There is encroachment of neural foramina from C2-C7 levels by bony spurs. Electronically Signed   By: Elmer Picker M.D.   On: 11/13/2022 14:38    Procedures Procedures    Medications Ordered in ED Medications - No data to display  ED Course/ Medical Decision Making/ A&P                           Medical Decision Making  The patient is a 88 year old female with past medical history of HTN, CAD, syncope, presenting by EMS from work for syncopal episode.  The differential diagnosis considered includes: Vasovagal, dehydration, CVA, arrhythmia, hypoglycemia, hypovolemia, anemia, metabolic dysfunction, medication side effect, infection.  On arrival, the patient is  hemodynamically stable and afebrile.  She is reporting mild pain in the back of her head without any C/T/L-spine tenderness to palpation.  On physical exam there is no visible signs of injury.  Patient has a c-collar in  place.  The patient's diagnostic workup, which I independently read viewed and interpreted, included a CT head which did not have any acute intracranial abnormality; CT C-spine without fracture or dislocation.  She received an EKG which showed sinus rhythm with occasional PVCs; chest x-ray without focal consolidation, mediastinal widening, or other cardiopulmonary abnormality.  The patient's diagnostic labs included a CBC with white blood cell count of 7.4 and hemoglobin of 14.3; CMP with glucose of 111 but no electrolyte or other metabolic abnormality; and a urinalysis without signs of infection.  Based on the patient's history, physical exam, diagnostic workup a orthostatic response is favored as likely source of the patient's syncope if she reports bending over at the time.  Her past medical records were reviewed and her recent echo (10/23) showed an EF of 60-65% with aortic sclerosis without stenosis.  There were no other significant valvular abnormalities.  There were no infectious causes noted or symptoms reported.  The patient was discharged with instructions to follow-up with her cardiologist and primary care doctor for reevaluation 24-48 hours.  She was given strict return precautions to the emergency department.  Amount and/or Complexity of Data Reviewed External Data Reviewed: labs and notes. Labs: ordered. Decision-making details documented in ED Course. Radiology: ordered and independent interpretation performed. Decision-making details documented in ED Course. ECG/medicine tests: ordered and independent interpretation performed. Decision-making details documented in ED Course.   Patient's presentation is most consistent with acute presentation with potential threat to life or bodily function.         Final Clinical Impression(s) / ED Diagnoses Final diagnoses:  Fall, initial encounter  Syncope, unspecified syncope type    Rx / DC Orders ED Discharge Orders     None          Dani Gobble, MD 11/13/22 1622    Elnora Morrison, MD 11/14/22 [phone]

## 2022-11-13 NOTE — ED Triage Notes (Signed)
Pt BIB [facility] from work where pt reports that she stood & felt dizzy then had LOC & fell. EMS reports on scene she c/o HA & sm hematoma seen on her head from impact. A/Ox4, not on any thinners, c-collar in place. Pt reports dizziness the past couple weeks with recent decreases in the dose of her BP meds. EMS reports vitals of : 180/100, 90 bpm, 12L good.

## 2022-11-14 ENCOUNTER — Telehealth: Payer: Self-pay | Admitting: Cardiology

## 2022-11-14 NOTE — Telephone Encounter (Signed)
Patient seen for syncopal episode in the ED, do we need to have her come back in?

## 2022-11-14 NOTE — Telephone Encounter (Signed)
Pt was seen in ED and wants to know if Dr. Harrell Gave thinks she should sch sooner f/u than May/2024

## 2022-11-15 ENCOUNTER — Encounter: Payer: Self-pay | Admitting: Nurse Practitioner

## 2022-11-15 ENCOUNTER — Ambulatory Visit (INDEPENDENT_AMBULATORY_CARE_PROVIDER_SITE_OTHER): Payer: Medicare Other | Admitting: Nurse Practitioner

## 2022-11-15 VITALS — BP 132/78 | HR 88 | Temp 97.5°F | Ht 62.0 in | Wt 143.6 lb

## 2022-11-15 DIAGNOSIS — F419 Anxiety disorder, unspecified: Secondary | ICD-10-CM

## 2022-11-15 DIAGNOSIS — R55 Syncope and collapse: Secondary | ICD-10-CM | POA: Diagnosis not present

## 2022-11-15 DIAGNOSIS — W19XXXD Unspecified fall, subsequent encounter: Secondary | ICD-10-CM

## 2022-11-15 DIAGNOSIS — I10 Essential (primary) hypertension: Secondary | ICD-10-CM

## 2022-11-15 NOTE — Telephone Encounter (Signed)
Left message for patient to call back       "She saw Urban Gibson just over a month ago for the same--workup has been unrevealing from a cardiac standpoint except for orthostatic recommendations. If she'd like to be seen sooner I'm happy to see her (or Urban Gibson can see her back), but I'm not sure there is much else we can do for workup except possibly discuss a loop recorder. Has she had more than this event since she saw Urban Gibson last? If so then I would definitely recommend a visit. If it was just this event (from the notes, sounds like she was bending over and then passed out), I'm comfortable with whatever she'd prefer (waiting vs. Earlier visit). Thanks. "

## 2022-11-15 NOTE — Patient Instructions (Addendum)
Please take Buspar 1 tab every morning  Buspirone Tablets What is this medication? BUSPIRONE (byoo SPYE rone) treats anxiety. It works by balancing the levels of dopamine and serotonin in your brain, substances that help regulate mood. This medicine may be used for other purposes; ask your health care provider or pharmacist if you have questions. COMMON BRAND NAME(S): BuSpar, Buspar Dividose What should I tell my care team before I take this medication? They need to know if you have any of these conditions: Kidney or liver disease An unusual or allergic reaction to buspirone, other medications, foods, dyes, or preservatives Pregnant or trying to get pregnant Breast-feeding How should I use this medication? Take this medication by mouth with a glass of water. Follow the directions on the prescription label. You may take this medication with or without food. To ensure that this medication always works the same way for you, you should take it either always with or always without food. Take your doses at regular intervals. Do not take your medication more often than directed. Do not stop taking except on the advice of your care team. Talk to your care team about the use of this medication in children. Special care may be needed. Overdosage: If you think you have taken too much of this medicine contact a poison control center or emergency room at once. NOTE: This medicine is only for you. Do not share this medicine with others. What if I miss a dose? If you miss a dose, take it as soon as you can. If it is almost time for your next dose, take only that dose. Do not take double or extra doses. What may interact with this medication? Do not take this medication with any of the following: Linezolid MAOIs like Carbex, Eldepryl, Marplan, Nardil, and Parnate Methylene blue Procarbazine This medication may also interact with the following: Diazepam Digoxin Diltiazem Erythromycin Grapefruit  juice Haloperidol Medications for mental depression or mood problems Medications for seizures like carbamazepine, phenobarbital and phenytoin Nefazodone Other medications for anxiety Rifampin Ritonavir Some antifungal medications like itraconazole, ketoconazole, and voriconazole Verapamil Warfarin This list may not describe all possible interactions. Give your health care provider a list of all the medicines, herbs, non-prescription drugs, or dietary supplements you use. Also tell them if you smoke, drink alcohol, or use illegal drugs. Some items may interact with your medicine. What should I watch for while using this medication? Visit your care team for regular checks on your progress. It may take 1 to 2 weeks before your anxiety gets better. This medication may affect your coordination, reaction time, or judgment. Do not drive or operate machinery until you know how this medication affects you. Sit up or stand slowly to reduce the risk of dizzy or fainting spells. Drinking alcohol with this medication can increase the risk of these side effects. What side effects may I notice from receiving this medication? Side effects that you should report to your care team as soon as possible: Allergic reactions--skin rash, itching, hives, swelling of the face, lips, tongue, or throat Irritability, confusion, fast or irregular heartbeat, muscle stiffness, twitching muscles, sweating, high fever, seizure, chills, vomiting, diarrhea, which may be signs of serotonin syndrome Side effects that usually do not require medical attention (report to your care team if they continue or are bothersome): Anxiety, nervousness Dizziness Drowsiness Headache Nausea Trouble sleeping This list may not describe all possible side effects. Call your doctor for medical advice about side effects. You may report side effects  to FDA at 1-800-FDA-1088. Where should I keep my medication? Keep out of the reach of  children. Store at room temperature below 30 degrees C (86 degrees F). Protect from light. Keep container tightly closed. Throw away any unused medication after the expiration date. NOTE: This sheet is a summary. It may not cover all possible information. If you have questions about this medicine, talk to your doctor, pharmacist, or health care provider.  2023 Elsevier/Gold Standard (2008-01-11 00:00:00)

## 2022-11-15 NOTE — Progress Notes (Signed)
Assessment and Plan:  Penny [name] was seen today for follow-up.  Diagnoses and all orders for this visit:  Syncope, unspecified syncope type/Fall No further episodes Head has no swelling or ecchymoses from previous fall- neuro exam normal Change positions slowly  Essential hypertension - continue medications, DASH diet, exercise and monitor at home. Call if greater than 130/80.  Go to the ER if any chest pain, shortness of breath, nausea, dizziness, severe HA, changes vision/speech  Anxiety Begin Buspar 5 mg every morning for control of anxiety Practice relaxation techniques and deep breathing techniques      Further disposition pending results of labs. Discussed med's effects and SE's.   Over 30 minutes of exam, counseling, chart review, and critical decision making was performed.   Future Appointments  Date Time Provider [place]  12/12/2022  2:20 PM Buford Dresser, MD [facility]  01/03/2023  9:30 AM Unk Pinto, MD [facility] None  04/11/2023  9:00 AM Alycia Rossetti, NP [facility] None  04/13/2023 10:00 AM Buford Dresser, MD [facility]    ------------------------------------------------------------------------------------------------------------------   HPI BP 132/78   Pulse 88   Temp (!) 97.5 F (36.4 C)   Ht 5\' 2"  (1.575 m)   Wt 143 lb 9.6 oz (65.1 kg)   SpO2 97%   BMI 26.26 kg/m   88 y.o.female presents for follow up from ER visit on 11/13/22 The patient is a 88 year old female with past medical history of HTN, CAD, syncope, presenting by EMS from work for syncopal episode.  The differential diagnosis considered includes: Vasovagal, dehydration, CVA, arrhythmia, hypoglycemia, hypovolemia, anemia, metabolic dysfunction, medication side effect, infection.   On arrival, the patient is hemodynamically stable and afebrile.  She is reporting mild pain in the back of her head without any C/T/L-spine tenderness to palpation.  On physical exam  there is no visible signs of injury.  Patient has a c-collar in place.   The patient's diagnostic workup, which I independently read viewed and interpreted, included a CT head which did not have any acute intracranial abnormality; CT C-spine without fracture or dislocation.  She received an EKG which showed sinus rhythm with occasional PVCs; chest x-ray without focal consolidation, mediastinal widening, or other cardiopulmonary abnormality.  The patient's diagnostic labs included a CBC with white blood cell count of 7.4 and hemoglobin of 14.3; CMP with glucose of 111 but no electrolyte or other metabolic abnormality; and a urinalysis without signs of infection.   Based on the patient's history, physical exam, diagnostic workup a orthostatic response is favored as likely source of the patient's syncope if she reports bending over at the time.  Her past medical records were reviewed and her recent echo (10/23) showed an EF of 60-65% with aortic sclerosis without stenosis.  There were no other significant valvular abnormalities.  There were no infectious causes noted or symptoms reported.   The patient was discharged with instructions to follow-up with her cardiologist and primary care doctor for reevaluation 24-48 hours.  She was given strict return precautions to the emergency department.  She has persistent pain in back of head and has a bruise on left elbow.  Denies visual changes,no other falls .   She was prescribed Buspar last visit but had not started. Will have her start taking once a day.   She is currently on HCTZ 12.5 mg daily and BP is normally well controlled.  She does use for swelling in her legs which is controlled with HCTZ. Denies chest pain, shortness of breath,  dizziness and headaches.  BP Readings from Last 3 Encounters:  11/15/22 132/78  11/13/22 (!) 151/89  11/06/22 136/80   BMI is Body mass index is 26.26 kg/m., she has been working on diet and exercise. Wt Readings from  Last 3 Encounters:  11/15/22 143 lb 9.6 oz (65.1 kg)  11/06/22 144 lb 9.6 oz (65.6 kg)  10/09/22 146 lb (66.2 kg)     Past Medical History:  Diagnosis Date   COPD (chronic obstructive pulmonary disease) with emphysema ([place])    COVID-19 12/09/2019   Hyperlipidemia    Hypertension    Lung cancer, upper lobe ([place]) 12/04/1996   RUL   Osteoporosis    S/P partial lobectomy of lung    [id number]   Thigh shingles 12/04/1996   Thoracic aorta atherosclerosis ([facility])    Vitamin D deficiency      Allergies  Allergen Reactions   Ace Inhibitors Cough   Calcium-Containing Compounds Other (See Comments)    constpation   Lactose Intolerance (Gi) Diarrhea   Polysporin [Bacitracin-Polymyxin B] Rash    Polysporin or neosporin gave her a rash, couldn't remember which    Current Outpatient Medications on File Prior to Visit  Medication Sig   aspirin 81 MG chewable tablet Chew 81 mg by mouth every evening.   cetirizine (ZYRTEC) 10 MG tablet Takes 1 tablet Daily (Patient taking differently: Take 10 mg by mouth every evening.)   Cholecalciferol 125 MCG (5000 UT) capsule Take 1 capsules Daily (Patient taking differently: Take 5,000 Units by mouth daily.)   Cyanocobalamin (VITAMIN B-12) 1000 MCG SUBL Takes 1 tab SL Daily (Patient taking differently: Take 1 tablet by mouth daily.)   hydrochlorothiazide (HYDRODIURIL) 12.5 MG tablet Take 1 tablet (12.5 mg total) by mouth daily.   montelukast (SINGULAIR) 10 MG tablet Take  1 tablet  Daily for Allergies                                                            /                            TAKE                      BY                     MOUTH   simvastatin (ZOCOR) 40 MG tablet Take  1 tablet  at Bedtime  for Cholesterol                                             /                                 TAKE                  BY                     MOUTH   busPIRone (BUSPAR) 5 MG tablet Take 1 tablet (5 mg total) by mouth 2 (two) times daily. (  Patient not taking:  Reported on 11/15/2022)   No current facility-administered medications on file prior to visit.    ROS: all negative except above.   Physical Exam:  BP 132/78   Pulse 88   Temp (!) 97.5 F (36.4 C)   Ht 5\' 2"  (1.575 m)   Wt 143 lb 9.6 oz (65.1 kg)   SpO2 97%   BMI 26.26 kg/m   General Appearance: Well nourished, in no apparent distress. Eyes: PERRLA, EOMs, conjunctiva no swelling or erythema Sinuses: No Frontal/maxillary tenderness ENT/Mouth: Ext aud canals clear, TMs without erythema, bulging. No erythema, swelling, or exudate on post pharynx.  Tonsils not swollen or erythematous. Hearing normal.  Neck: Supple, thyroid normal.  Respiratory: Respiratory effort normal, BS equal bilaterally without rales, rhonchi, wheezing or stridor.  Cardio: RRR with no MRGs. Brisk peripheral pulses without edema.  Abdomen: Soft, + BS.  Non tender, no guarding, rebound, hernias, masses. Lymphatics: Non tender without lymphadenopathy.  Musculoskeletal: Full ROM, 5/5 strength, normal gait.  Skin: Warm, dry without rashes, lesions, ecchymosis.  Neuro: Cranial nerves intact. Normal muscle tone, no cerebellar symptoms. Sensation intact.  Psych: Awake and oriented X 3, normal affect, Insight and Judgment appropriate.     Alycia Rossetti, NP 10:46 AM [facility]

## 2022-11-15 NOTE — Telephone Encounter (Signed)
Patient returned call to office, reviewed the following recommendations from Dr. Harrell Gave and scheduled patient for a sooner follow up on 1/9. Patient instructed that should the event occur again, to be seen in the emergency department and then let us know!        "She saw Urban Gibson just over a month ago for the same--workup has been unrevealing from a cardiac standpoint except for orthostatic recommendations. If she'd like to be seen sooner I'm happy to see her (or Urban Gibson can see her back), but I'm not sure there is much else we can do for workup except possibly discuss a loop recorder. Has she had more than this event since she saw Urban Gibson last? If so then I would definitely recommend a visit. If it was just this event (from the notes, sounds like she was bending over and then passed out), I'm comfortable with whatever she'd prefer (waiting vs. Earlier visit). Thanks. "

## 2022-11-17 ENCOUNTER — Ambulatory Visit (HOSPITAL_BASED_OUTPATIENT_CLINIC_OR_DEPARTMENT_OTHER): Payer: Medicare Other | Admitting: Family

## 2022-12-02 ENCOUNTER — Other Ambulatory Visit: Payer: Self-pay | Admitting: Nurse Practitioner

## 2022-12-02 DIAGNOSIS — R413 Other amnesia: Secondary | ICD-10-CM

## 2022-12-12 ENCOUNTER — Ambulatory Visit (HOSPITAL_BASED_OUTPATIENT_CLINIC_OR_DEPARTMENT_OTHER): Payer: Medicare Other | Admitting: Cardiology

## 2022-12-12 VITALS — BP 157/92 | HR 87 | Ht 62.0 in | Wt 145.0 lb

## 2022-12-12 DIAGNOSIS — R55 Syncope and collapse: Secondary | ICD-10-CM

## 2022-12-12 DIAGNOSIS — I2510 Atherosclerotic heart disease of native coronary artery without angina pectoris: Secondary | ICD-10-CM

## 2022-12-12 DIAGNOSIS — I10 Essential (primary) hypertension: Secondary | ICD-10-CM

## 2022-12-12 DIAGNOSIS — Z7189 Other specified counseling: Secondary | ICD-10-CM

## 2022-12-12 NOTE — Progress Notes (Signed)
Cardiology Office Note:    Date:  12/15/2022   ID:  Penny [name], DOB 1933/04/29, MRN [id number]  PCP:  Lucky Cowboy, MD  Cardiologist:  Jodelle Red, MD  Referring MD: Lucky Cowboy, MD   CC: syncope  History of Present Illness:    Penny [name] is a 89 y.o. female with a hx of hyperlipidemia, hypertension, COPD, and lung cancer who is seen for the evaluation and management of syncope. I met her during her hospitalization in 09/2022.  Today, the patient presents with her daughter to help provide history. She states that she has been doing well. She has been having lower back pain, and she feels that she loses her balance easily due to dizziness when looking around.  The day of her syncopal episode, she was working at a [employer]. She was unable to sit up straight in the chair and she fell out of the chair and was unable to remember what happened. Post fall the back of her head, her elbow, and her lower back all had pain. In the hospital post admission her heart rate lowered drastically and she was syncopal multiple times and metoprolol was discontinued.   She doesn't think she has had syncopal symptoms since, but still complains about her balance issues.  She also noticed that she had some BLE edema for the first time ever after her syncope.  She denies any palpitations, chest pain, shortness of breath, or peripheral edema. headaches, orthopnea, or PND.  Past Medical History:  Diagnosis Date   COPD (chronic obstructive pulmonary disease) with emphysema ([facility])    COVID-19 12/09/2019   Hyperlipidemia    Hypertension    Lung cancer, upper lobe ([facility]) 12/04/1996   RUL   Osteoporosis    S/P partial lobectomy of lung    [id number]   Thigh shingles 12/04/1996   Thoracic aorta atherosclerosis ([facility])    Vitamin D deficiency     Past Surgical History:  Procedure Laterality Date   CARPAL TUNNEL RELEASE Right    2009   LUNG REMOVAL, PARTIAL Right    RUL  1998   OLECRANON BURSECTOMY Left 03/20/2017   Procedure: EXICISION OLECRANON BURSA LEFT ELBOW;  Surgeon: Cindee Salt, MD;  Location: [facility];  Service: Orthopedics;  Laterality: Left;  with scb block in preop   SHOULDER ADHESION RELEASE Right    2004    Current Medications: Current Outpatient Medications on File Prior to Visit  Medication Sig   aspirin 81 MG chewable tablet Chew 81 mg by mouth every evening.   busPIRone (BUSPAR) 5 MG tablet Take  1 tablet  2 x / day for Chronic Anxiety                                                                                     /                                                  TAKE  BY                                                 MOUTH   cetirizine (ZYRTEC) 10 MG tablet Takes 1 tablet Daily (Patient taking differently: Take 10 mg by mouth every evening.)   Cholecalciferol 125 MCG (5000 UT) capsule Take 1 capsules Daily (Patient taking differently: Take 5,000 Units by mouth daily.)   Cyanocobalamin (VITAMIN B-12) 1000 MCG SUBL Takes 1 tab SL Daily (Patient taking differently: Take 1 tablet by mouth daily.)   hydrochlorothiazide (HYDRODIURIL) 12.5 MG tablet Take 1 tablet (12.5 mg total) by mouth daily.   montelukast (SINGULAIR) 10 MG tablet Take  1 tablet  Daily for Allergies                                                            /                            TAKE                      BY                     MOUTH   simvastatin (ZOCOR) 40 MG tablet Take  1 tablet  at Bedtime  for Cholesterol                                             /                                 TAKE                  BY                     MOUTH   No current facility-administered medications on file prior to visit.     Allergies:   Ace inhibitors, Calcium-containing compounds, Lactose intolerance (gi), and Polysporin [bacitracin-polymyxin b]   Social History   Tobacco Use   Smoking status: Former    Types: Cigarettes     Quit date: 12/04/1996    Years since quitting: 26.0   Smokeless tobacco: Never  Vaping Use   Vaping Use: Never used  Substance Use Topics   Alcohol use: Yes    Alcohol/week: 1.0 standard drink of alcohol    Types: 1 Glasses of wine per week    Comment: social   Drug use: No    Family History: family history includes Breast cancer in her paternal aunt; Heart disease in her mother; Lung cancer in her father.  ROS:   Please see the history of present illness.   (+) Lightheadedness (vertigo) (+) Syncope Additional pertinent ROS otherwise unremarkable   EKGs/Labs/Other Studies Reviewed:    The following studies were reviewed today: Monitor 10/2022 Patch Wear Time:  14 days and 0 hours   Patient had a  min HR of 58 bpm, max HR of 211 bpm, and avg HR of 86 bpm. Predominant underlying rhythm was Sinus Rhythm. 19 Supraventricular Tachycardia runs occurred, the run with the fastest interval lasting 10.6 secs with a max rate of 211 bpm (avg 162 bpm); the run with the fastest interval was also the longest. No VT, atrial fibrillation, high degree block, or pauses noted. Isolated atrial and ventricular ectopy was rare (<1%). There was 1 triggered event, which was sinus tachycardia. No high risk arrhythmias detected.   09/11/2022:  ECHO  1. Left ventricular ejection fraction, by estimation, is 60 to 65%. The  left ventricle has normal function. The left ventricle has no regional  wall motion abnormalities. Left ventricular diastolic parameters are  consistent with Grade I diastolic  dysfunction (impaired relaxation).   2. Right ventricular systolic function is normal. The right ventricular  size is normal.   3. The mitral valve is normal in structure. No evidence of mitral valve  regurgitation. No evidence of mitral stenosis.   4. The aortic valve is tricuspid. There is mild calcification of the  aortic valve. Aortic valve regurgitation is not visualized. Aortic valve  sclerosis is present,  with no evidence of aortic valve stenosis. Aortic  valve mean gradient measures 4.0 mmHg.  Aortic valve Vmax measures 1.38 m/s.   5. The inferior vena cava is normal in size with greater than 50%  respiratory variability, suggesting right atrial pressure of 3 mmHg.   EKG:  EKG is personally reviewed.   12/12/2022: NSR at 87 bpm  Recent Labs: 09/10/2022: TSH 1.738 09/20/2022: Magnesium 1.9 11/13/2022: ALT 15; BUN 21; Creatinine, Ser 0.72; Hemoglobin 14.3; Platelets 171; Potassium 3.7; Sodium 142  Recent Lipid Panel    Component Value Date/Time   CHOL 168 05/31/2022 1244   TRIG 85 05/31/2022 1244   HDL 65 05/31/2022 1244   CHOLHDL 2.6 05/31/2022 1244   VLDL 19 02/07/2017 1121   LDLCALC 85 05/31/2022 1244    Physical Exam:    VS:  BP (!) 157/92 (BP Location: Left Arm, Patient Position: Sitting, Cuff Size: Normal)   Pulse 87   Ht 5\' 2"  (1.575 m)   Wt 145 lb (65.8 kg)   BMI 26.52 kg/m    Orthostatics Lying 150/87, HR 86 Sitting 138/88, HR 87 Standing 155/84, HR 89 Standing 3 min 130/85, HR 92  Wt Readings from Last 3 Encounters:  12/12/22 145 lb (65.8 kg)  11/15/22 143 lb 9.6 oz (65.1 kg)  11/06/22 144 lb 9.6 oz (65.6 kg)    GEN: Well nourished, well developed in no acute distress HEENT: Normal, moist mucous membranes NECK: No JVD CARDIAC: regular rhythm, normal S1 and S2, no rubs or gallops. No murmur. VASCULAR: Radial and DP pulses 2+ bilaterally. No carotid bruits RESPIRATORY:  Clear to auscultation without rales, wheezing or rhonchi  ABDOMEN: Soft, non-tender, non-distended MUSCULOSKELETAL:  Ambulates independently SKIN: Warm and dry, no edema NEUROLOGIC:  Alert and oriented x 3. No focal neuro deficits noted. PSYCHIATRIC:  Normal affect    ASSESSMENT:    1. Syncope and collapse   2. Essential hypertension   3. Coronary artery calcification seen on CT scan   4. Cardiac risk counseling    PLAN:   Syncope Drug induced sinus bradycardia -stopped metoprolol  permanently -reviewed monitor, echo as above -no recurrent events   Hypertension -on HCTZ, above goal today. Reports typically numbers are better. Not orthostatic today, but given history, will not increase antihypertensive regimen today  Coronary calcification -at age 32, unclear significance of this. She is on aspirin and simvastatin.  Cardiac risk counseling and prevention recommendations: -recommend heart healthy/Mediterranean diet, with whole grains, fruits, vegetable, fish, lean meats, nuts, and olive oil. Limit salt. -recommend moderate walking, 3-5 times/week for 30-50 minutes each session. Aim for at least 150 minutes.week. Goal should be pace of 3 miles/hours, or walking 1.5 miles in 30 minutes -recommend avoidance of tobacco products. Avoid excess alcohol.  Plan for follow up: 2-3 months  Jodelle Red, MD, PhD, [facility], [place]  [facility]    Medication Adjustments/Labs and Tests Ordered: Current medicines are reviewed at length with the patient today.  Concerns regarding medicines are outlined above.  Orders Placed This Encounter  Procedures   EKG 12-Lead   No orders of the defined types were placed in this encounter.  Patient Instructions  Medication Instructions:  Your physician recommends that you continue on your current medications as directed. Please refer to the Current Medication list given to you today.   *If you need a refill on your cardiac medications before your next appointment, please call your pharmacy*  Lab Work: NONE   Testing/Procedures: NONE  Follow-Up: At [facility], you and your health needs are our priority.  As part of our continuing mission to provide you with exceptional heart care, we have created designated Provider Care Teams.  These Care Teams include your primary Cardiologist (physician) and Advanced Practice Providers (APPs -  Physician Assistants and Nurse Practitioners) who all work together to provide  you with the care you need, when you need it.  We recommend signing up for the patient portal called "MyChart".  Sign up information is provided on this After Visit Summary.  MyChart is used to connect with patients for Virtual Visits (Telemedicine).  Patients are able to view lab/test results, encounter notes, upcoming appointments, etc.  Non-urgent messages can be sent to your provider as well.   To learn more about what you can do with MyChart, go to [email or web address].    Your next appointment:   2-3 month(s)  The format for your next appointment:   In Person  Provider:   Jodelle Red, MD or Gillian Shields, NP            I,Coren O'Brien,acting as a scribe for Jodelle Red, MD.,have documented all relevant documentation on the behalf of Jodelle Red, MD,as directed by  Jodelle Red, MD while in the presence of Jodelle Red, MD.  I, Jodelle Red, MD, have reviewed all documentation for this visit. The documentation on 12/15/22 for the exam, diagnosis, procedures, and orders are all accurate and complete.   Signed, Jodelle Red, MD PhD 12/15/2022 11:55 AM    [facility]

## 2022-12-12 NOTE — Patient Instructions (Signed)
Medication Instructions:  Your physician recommends that you continue on your current medications as directed. Please refer to the Current Medication list given to you today.   *If you need a refill on your cardiac medications before your next appointment, please call your pharmacy*  Lab Work: NONE   Testing/Procedures: NONE  Follow-Up: At [facility], you and your health needs are our priority.  As part of our continuing mission to provide you with exceptional heart care, we have created designated Provider Care Teams.  These Care Teams include your primary Cardiologist (physician) and Advanced Practice Providers (APPs -  Physician Assistants and Nurse Practitioners) who all work together to provide you with the care you need, when you need it.  We recommend signing up for the patient portal called "MyChart".  Sign up information is provided on this After Visit Summary.  MyChart is used to connect with patients for Virtual Visits (Telemedicine).  Patients are able to view lab/test results, encounter notes, upcoming appointments, etc.  Non-urgent messages can be sent to your provider as well.   To learn more about what you can do with MyChart, go to [email or web address].    Your next appointment:   2-3 month(s)  The format for your next appointment:   In Person  Provider:   Buford Dresser, MD or Laurann Montana, NP

## 2022-12-15 ENCOUNTER — Encounter (HOSPITAL_BASED_OUTPATIENT_CLINIC_OR_DEPARTMENT_OTHER): Payer: Self-pay | Admitting: Cardiology

## 2023-01-03 ENCOUNTER — Ambulatory Visit (INDEPENDENT_AMBULATORY_CARE_PROVIDER_SITE_OTHER): Payer: Medicare Other | Admitting: Internal Medicine

## 2023-01-03 ENCOUNTER — Encounter: Payer: Self-pay | Admitting: Internal Medicine

## 2023-01-03 VITALS — BP 106/60 | HR 88 | Temp 98.1°F | Resp 16 | Ht 62.0 in | Wt 146.8 lb

## 2023-01-03 DIAGNOSIS — Z79899 Other long term (current) drug therapy: Secondary | ICD-10-CM

## 2023-01-03 DIAGNOSIS — E559 Vitamin D deficiency, unspecified: Secondary | ICD-10-CM | POA: Diagnosis not present

## 2023-01-03 DIAGNOSIS — E782 Mixed hyperlipidemia: Secondary | ICD-10-CM | POA: Diagnosis not present

## 2023-01-03 DIAGNOSIS — I700 Atherosclerosis of aorta: Secondary | ICD-10-CM

## 2023-01-03 DIAGNOSIS — I10 Essential (primary) hypertension: Secondary | ICD-10-CM | POA: Diagnosis not present

## 2023-01-03 DIAGNOSIS — J439 Emphysema, unspecified: Secondary | ICD-10-CM | POA: Diagnosis not present

## 2023-01-03 DIAGNOSIS — R7309 Other abnormal glucose: Secondary | ICD-10-CM

## 2023-01-03 DIAGNOSIS — C3410 Malignant neoplasm of upper lobe, unspecified bronchus or lung: Secondary | ICD-10-CM | POA: Diagnosis not present

## 2023-01-03 NOTE — Patient Instructions (Signed)

## 2023-01-03 NOTE — Progress Notes (Signed)
Future Appointments  Date Time Provider Department  01/03/2023                    3 mo ov   9:30 AM Unk Pinto, MD [facility]  03/14/2023  3:00 PM Buford Dresser, MD [facility]  04/11/2023                      cpe  9:00 AM Alycia Rossetti, NP [facility]  04/13/2023 10:00 AM Buford Dresser, MD [facility]    History of Present Illness:       This very nice 89 y.o.WWF presents for 3 month follow up with HTN, HLD, Pre-Diabetes and Vitamin D Deficiency. In 1998, she had excision of a RUL lung Ca & remains in remission.   CT scan in 2015 showed Aortic Atherosclerosis.        Patient is treated for HTN  since 2000 & BP has been controlled at home. Today'S BP is at goal - 106/60.  Patient was hospitalized in Oct 2023 with syncope & had a negative w/u. Patient has had no complaints of any cardiac type chest pain, palpitations, dyspnea Vertell Limber /PND, dizziness, claudication  or dependent edema.        Hyperlipidemia is controlled with diet & meds. Patient denies myalgias or other med SE'S. Last Lipids were at goal :  Lab Results  Component Value Date   CHOL 168 05/31/2022   HDL 65 05/31/2022   LDL CALC 85 05/31/2022   TRIG 85 05/31/2022   CHOL HDL 2.6 05/31/2022     Also, the patient has history of PreDiabetes (A1c 5.9% /2009)  and has had no symptoms of reactive hypoglycemia, diabetic polys, paresthesias or visual blurring.  Last A1c was  near goal :  Lab Results  Component Value Date   HOGBACK 5.8 (H) 05/31/2022                                                          Further, the patient also has history of Vitamin D Deficiency and supplements vitamin D . Last vitamin D was at goal :  Lab Results  Component Value Date   VD25OH 84 05/31/2022      Current Outpatient Medications on File Prior to Visit  Medication Sig   aspirin 81 MG chewable tablet Take 1 tablet daily   busPIRone 5 MG tablet Take  1 tablet  2 x / day    cetirizine  10 MG tablet Takes 1  tablet Daily (   Cholecalciferol 5000 u Take 1 capsule Daily    VITAMIN B-12 1000 MCG SUBL Takes 1 tab SL Daily    Hydrochlorothiazide  12.5 MG tablet Take 1 tablet  daily.   montelukast (SINGULAIR) 10 MG tablet Take  1 tablet  Daily    simvastatin (ZOCOR) 40 MG tablet Take  1 tablet  at Bedtime     Allergies  Allergen Reactions   Ace Inhibitors Cough   Calcium-Containing Compounds Other (See Comments)    constpation   Lactose Intolerance (Gi) Diarrhea   Polysporin [Bacitracin-Polymyxin B] Rash    Polysporin or neosporin gave her a rash, couldn't remember which     PMHx:   Past Medical History:  Diagnosis Date   COPD (chronic obstructive pulmonary disease)  with emphysema ([place])    COVID-19 12/09/2019   Hyperlipidemia    Hypertension    Lung cancer, upper lobe ([place]) 12/04/1996   RUL   Osteoporosis    S/P partial lobectomy of lung    [id number]   Thigh shingles 12/04/1996   Thoracic aorta atherosclerosis ([place])    Vitamin D deficiency       Immunization History  Administered Date(S) Administered   Influenza, High Dose  07/27/2019, 10/07/2020, 09/19/2021, 09/20/2022   Influenza 08/04/2012, 09/03/2013, 09/03/2018   PFIZER-SARS-COV-2 Vacc 01/26/2020, 02/16/2020   Pneumococcal - 13 07/21/2014   Pneumococcal -23 10/30/2005   Td 04/03/2013      Past Surgical History:  Procedure Laterality Date   CARPAL TUNNEL RELEASE Right    2009   LUNG REMOVAL, PARTIAL Right    RUL 1998   OLECRANON BURSECTOMY Left 03/20/2017   Procedure: EXICISION OLECRANON BURSA LEFT ELBOW;  Surgeon: Daryll Brod, MD;  Location: [facility];  Service: Orthopedics;  Laterality: Left;  with scb block in preop   SHOULDER ADHESION RELEASE Right    2004     FHx:    Reviewed / unchanged   SHx:    Reviewed / unchanged    Systems Review:  Constitutional: Denies fever, chills, wt changes, headaches, insomnia, fatigue, night sweats, change in appetite. Eyes: Denies redness, blurred  vision, diplopia, discharge, itchy, watery eyes.  ENT: Denies discharge, congestion, post nasal drip, epistaxis, sore throat, earache, hearing loss, dental pain, tinnitus, vertigo, sinus pain, snoring.  CV: Denies chest pain, palpitations, irregular heartbeat, syncope, dyspnea, diaphoresis, orthopnea, PND, claudication or edema. Respiratory: denies cough, dyspnea, DOE, pleurisy, hoarseness, laryngitis, wheezing.  Gastrointestinal: Denies dysphagia, odynophagia, heartburn, reflux, water brash, abdominal pain or cramps, nausea, vomiting, bloating, diarrhea, constipation, hematemesis, melena, hematochezia  or hemorrhoids. Genitourinary: Denies dysuria, frequency, urgency, nocturia, hesitancy, discharge, hematuria or flank pain. Musculoskeletal: Denies arthralgias, myalgias, stiffness, jt. swelling, pain, limping or strain/sprain.  Skin: Denies pruritus, rash, hives, warts, acne, eczema or change in skin lesion(S). Neuro: No weakness, tremor, incoordination, spasms, paresthesia or pain. Psychiatric: Denies confusion, memory loss or sensory loss. Endo: Denies change in weight, skin or hair change.  Heme/Lymph: No excessive bleeding, bruising or enlarged lymph nodes.   Physical Exam  BP 106/60   Pulse 88   Temp 98.1 F (36.7 C)   Resp 16   Ht 5\' 2"  (1.575 m)   Wt 146 lb 12.8 oz (66.6 kg)   SpO2 96%   BMI 26.85 kg/m   Appears  well nourished, well groomed  and in no distress.  Eyes: PERRLA, EOMs, conjunctiva no swelling or erythema. Sinuses: No frontal/maxillary tenderness ENT/Mouth: EAC's clear, TM's nl w/o erythema, bulging. Nares clear w/o erythema, swelling, exudates. Oropharynx clear without erythema or exudates. Oral hygiene is good. Tongue normal, non obstructing. Hearing intact.  Neck: Supple. Thyroid not palpable. Car 2+/2+ without bruits, nodes or JVD. Chest: Respirations nl with BS clear & equal w/o rales, rhonchi, wheezing or stridor.  Cor: Heart sounds normal w/ regular rate  and rhythm without sig. murmurs, gallops, clicks or rubs. Peripheral pulses normal and equal  without edema.  Abdomen: Soft & bowel sounds normal. Non-tender w/o guarding, rebound, hernias, masses or organomegaly.  Lymphatics: Unremarkable.  Musculoskeletal: Full ROM all peripheral extremities, joint stability, 5/5 strength and normal gait.  Skin: Warm, dry without exposed rashes, lesions or ecchymosis apparent.  Neuro: Cranial nerves intact, reflexes equal bilaterally. Sensory-motor testing grossly intact. Tendon reflexes grossly intact.  Pysch: Alert &  oriented x 3.  Insight and judgement nl & appropriate. No ideations.   Assessment and Plan:  1. Essential hypertension  - Continue medication, monitor blood pressure at home.  - Continue DASH diet.  Reminder to go to the ER if any CP,  SOB, nausea, dizziness, severe HA, changes vision/speech.   - CBC with Differential/Platelet - COMPLETE METABOLIC PANEL WITH GFR - Magnesium - TSH  2. Hyperlipidemia, mixed  - Continue diet/meds, exercise,& lifestyle modifications.  - Continue monitor periodic cholesterol/liver & renal functions    - Lipid panel - TSH  3. Abnormal glucose  - Continue diet, exercise  - Lifestyle modifications.  - Monitor appropriate labs   - Hemoglobin A1c - Insulin, random  4. Vitamin D deficiency   - Continue supplementation   - VITAMIN D 25 Hydroxy  5. Atherosclerosis of aorta ([place]) BY ct SCAN IN 2015  - Lipid panel  6. Medication management  - CBC with Differential/Platelet - COMPLETE METABOLIC PANEL WITH GFR - Magnesium - Lipid panel - TSH - Hemoglobin A1c - Insulin, random - VITAMIN D 25 Hydroxy    Kirtland Bouchard , MD

## 2023-01-04 ENCOUNTER — Other Ambulatory Visit: Payer: Self-pay

## 2023-01-04 DIAGNOSIS — R413 Other amnesia: Secondary | ICD-10-CM

## 2023-01-04 LAB — CBC WITH DIFFERENTIAL/PLATELET
Absolute Monocytes: 510 cells/uL (ref 200–950)
Basophils Absolute: 20 cells/uL (ref 0–200)
Basophils Relative: 0.4 %
Eosinophils Absolute: 102 cells/uL (ref 15–500)
Eosinophils Relative: 2 %
HCT: 41.4 % (ref 35.0–45.0)
Hemoglobin: 13.3 g/dL (ref 11.7–15.5)
Lymphs Abs: 1265 cells/uL (ref 850–3900)
MCH: 28 pg (ref 27.0–33.0)
MCHC: 32.1 g/dL (ref 32.0–36.0)
MCV: 87.2 fL (ref 80.0–100.0)
MPV: 11 fL (ref 7.5–12.5)
Monocytes Relative: 10 %
Neutro Abs: 3203 cells/uL (ref 1500–7800)
Neutrophils Relative %: 62.8 %
Platelets: 170 10*3/uL (ref 140–400)
RBC: 4.75 10*6/uL (ref 3.80–5.10)
RDW: 12.4 % (ref 11.0–15.0)
Total Lymphocyte: 24.8 %
WBC: 5.1 10*3/uL (ref 3.8–10.8)

## 2023-01-04 LAB — HEMOGLOBIN A1C
Hgb A1c MFr Bld: 6 % of total Hgb — ABNORMAL HIGH (ref <5.7)
Mean Plasma Glucose: 126 mg/dL
eAG (mmol/L): 7 mmol/L

## 2023-01-04 LAB — COMPLETE METABOLIC PANEL WITH GFR
AG Ratio: 1.9 (calc) (ref 1.0–2.5)
ALT: 15 U/L (ref 6–29)
AST: 26 U/L (ref 10–35)
Albumin: 4.3 g/dL (ref 3.6–5.1)
Alkaline phosphatase (APISO): 71 U/L (ref 37–153)
BUN: 21 mg/dL (ref 7–25)
CO2: 33 mmol/L — ABNORMAL HIGH (ref 20–32)
Calcium: 9.6 mg/dL (ref 8.6–10.4)
Chloride: 105 mmol/L (ref 98–110)
Creat: 0.78 mg/dL (ref 0.60–0.95)
Globulin: 2.3 g/dL (calc) (ref 1.9–3.7)
Glucose, Bld: 72 mg/dL (ref 65–99)
Potassium: 3.8 mmol/L (ref 3.5–5.3)
Sodium: 145 mmol/L (ref 135–146)
Total Bilirubin: 0.4 mg/dL (ref 0.2–1.2)
Total Protein: 6.6 g/dL (ref 6.1–8.1)
eGFR: 73 mL/min/{1.73_m2} (ref >=60)

## 2023-01-04 LAB — LIPID PANEL
Cholesterol: 184 mg/dL (ref <200)
HDL: 72 mg/dL (ref >=50)
LDL Cholesterol (Calc): 94 mg/dL (calc)
Non-HDL Cholesterol (Calc): 112 mg/dL (calc) (ref <130)
Total CHOL/HDL Ratio: 2.6 (calc) (ref <5.0)
Triglycerides: 88 mg/dL (ref <150)

## 2023-01-04 LAB — TSH: TSH: 1.24 mIU/L (ref 0.40–4.50)

## 2023-01-04 LAB — INSULIN, RANDOM: Insulin: 4.5 u[IU]/mL

## 2023-01-04 LAB — MAGNESIUM: Magnesium: 1.6 mg/dL (ref 1.5–2.5)

## 2023-01-04 LAB — VITAMIN D 25 HYDROXY (VIT D DEFICIENCY, FRACTURES): Vit D, 25-Hydroxy: 73 ng/mL (ref 30–100)

## 2023-01-04 MED ORDER — BUSPIRONE HCL 5 MG PO TABS: ORAL_TABLET | ORAL | 0 refills | Status: DC

## 2023-01-04 NOTE — Progress Notes (Signed)
<><><><><><><><><><><><><><><><><><><><><><><><><><><><><><><><><> <><><><><><><><><><><><><><><><><><><><><><><><><><><><><><><><><> -   Test results slightly outside the reference range are not unusual. If there is anything important, I will review this with you,  otherwise it is considered normal test values.  If you have further questions,  please do not hesitate to contact me at the office or via My Chart.  <><><><><><><><><><><><><><><><><><><><><><><><><><><><><><><><><> <><><><><><><><><><><><><><><><><><><><><><><><><><><><><><><><><>  -  A1c = 6.0% - glucoses too high  - Avoid Sweets, Candy & White Stuff   - White Rice, White Potatoes, White Flour  - Breads &  Pasta <><><><><><><><><><><><><><><><><><><><><><><><><><><><><><><><><>  -   Magnesium = 1.6    -  is very  low- goal is betw 2.0 - 2.5,   - So..............Marland Kitchen  Recommend that you take Magnesium 500 mg tablet  2 x/day with Meals    - also important to eat lots of  leafy green vegetables   - spinach - Kale - collards - greens - okra - asparagus  - broccoli - quinoa - squash - almonds   - black, red, white beans  -  peas - green beans <><><><><><><><><><><><><><><><><><><><><><><><><><><><><><><><><>  -  last   Chol = 184 -  Excellent   - Very low risk for Heart Attack  / Stroke <><><><><><><><><><><><><><><><><><><><><><><><><><><><><><><><><> <><><><><><><><><><><><><><><><><><><><><><><><><><><><><><><><><>  - Vitamin D = 73 - Excellent -Please keep dose same  <><><><><><><><><><><><><><><><><><><><><><><><><><><><><><><><><>  - All Else - CBC - Kidneys - Electrolytes - Liver - Magnesium & Thyroid    - all  Normal / OK <><><><><><><><><><><><><><><><><><><><><><><><><><><><><><><><><>  -

## 2023-01-07 DIAGNOSIS — C3410 Malignant neoplasm of upper lobe, unspecified bronchus or lung: Secondary | ICD-10-CM | POA: Insufficient documentation

## 2023-01-29 ENCOUNTER — Ambulatory Visit (INDEPENDENT_AMBULATORY_CARE_PROVIDER_SITE_OTHER): Payer: Medicare Other | Admitting: Internal Medicine

## 2023-01-29 ENCOUNTER — Encounter: Payer: Self-pay | Admitting: Internal Medicine

## 2023-01-29 VITALS — BP 138/84 | HR 93 | Temp 97.2°F | Ht 62.0 in | Wt 145.4 lb

## 2023-01-29 DIAGNOSIS — M5442 Lumbago with sciatica, left side: Secondary | ICD-10-CM | POA: Diagnosis not present

## 2023-01-29 DIAGNOSIS — M25552 Pain in left hip: Secondary | ICD-10-CM | POA: Diagnosis not present

## 2023-01-29 MED ORDER — DEXAMETHASONE 4 MG PO TABS: ORAL_TABLET | ORAL | 0 refills | Status: DC

## 2023-01-29 NOTE — Progress Notes (Signed)
Future Appointments  Date Time Provider Department  01/29/2023  3:30 PM Unk Pinto, MD [facility]  03/14/2023  3:00 PM Buford Dresser, MD [facility]  04/11/2023  9:00 AM Alycia Rossetti, NP [facility]  04/13/2023 10:00 AM Buford Dresser, MD [facility]    History of Present Illness:     This very nice 89 yo WWF  with HTN, HLD, Pre-Diabetes and Vitamin D Deficiency presents with c/o Left hip pains and lower back pain which she relates to a fall in December. Gait has not been affected. No sciatica type pains .    Current Outpatient Medications on File Prior to Visit  Medication Sig   aspirin 81 MG chewable tablet Take every evening.   busPIRone 5 MG tablet Take  1 tablet  2 x / day for Chronic Anxiety    cetirizine 10 MG tablet Takes 1 tablet Daily    Cholecalciferol 5000 u Take 1 capsules Daily    VITAMIN B-12  1000 mcg  SL Takes 1 tab SL Daily    hydrochlorothiazide 12.5 MG tablet Take 1 tablet daily.   montelukast 10 MG tablet Take  1 tablet  Daily    simvastatin 40 MG tablet Take  1 tablet  at Bedtime       Allergies  Allergen Reactions   Ace Inhibitors Cough   Calcium-Containing Compounds Other (See Comments)    constpation   Lactose Intolerance (Gi) Diarrhea   Polysporin [Bacitracin-Polymyxin B] Rash    Polysporin or neosporin gave her a rash, couldn't remember which     Problem list She has Essential hypertension; Hyperlipidemia, mixed; Personal history of lung cancer; Vitamin D deficiency; COPD (chronic obstructive pulmonary disease) with emphysema ([place]); Atherosclerosis of aorta ([place]) BY ct SCAN IN 2015; Osteoporosis; Abnormal glucose; B12 deficiency; COVID-19 (11/30/2021); Symptomatic sinus bradycardia; Hypokalemia; Hypomagnesemia; CAD (coronary artery disease); Syncope; and Malignant neoplasm of upper lobe of lung, unspecified laterality ([place]) on their problem list.   Observations/Objective:  BP (!) 138/98   Pulse 93   Temp (!) 97.2 F (36.2  C)   Ht '5\' 2"'$  (1.575 m)   Wt 145 lb 6.4 oz (66 kg)   SpO2 98%   BMI 26.59 kg/m   HEENT - WNL. Neck - supple.  Chest - Clear equal BS. Cor - Nl HS. RRR w/o sig MGR. PP 1(+). No edema. MS- FROM w/o deformities.  Gait Nl. Negative  SLR &  Negative bilat Figure 4 & internal  /external hip rotation. Tender over Lt Hip / Gr Trochanteric bursae.  Neuro -  Nl w/o focal abnormalities.  Assessment and Plan:   1. Left hip pain  - DG Hip Unilat W OR W/O Pelvis 2-3 Views Left; Future  - dexamethasone 4 MG tablet;  Take 1 tab 3 x day - 3 days, then 2 x day - 3 days, then 1 tab daily   Dispense: 20 tablet; Refill: 0  2. Acute midline low back pain with left-sided sciatica  - DG Lumbar Spine Complete; Future  - dexamethasone  4 MG tablet;  Take 1 tab 3 x day - 3 days, then 2 x day - 3 days, then 1 tab daily   Dispense: 20 tablet; Refill: 0   Follow Up Instructions:        I discussed the assessment and treatment plan with the patient. The patient was provided an opportunity to ask questions and all were answered. The patient agreed with the plan and demonstrated an understanding of the  instructions.       The patient was advised to call back or seek an in-person evaluation if the symptoms worsen or if the condition fails to improve as anticipated.    Kirtland Bouchard, MD

## 2023-01-31 ENCOUNTER — Other Ambulatory Visit: Payer: Self-pay | Admitting: Internal Medicine

## 2023-01-31 ENCOUNTER — Ambulatory Visit
Admission: RE | Admit: 2023-01-31 | Discharge: 2023-01-31 | Disposition: A | Discharge status: Home / Self Care | Payer: Medicare Other | Source: Ambulatory Visit | Attending: Internal Medicine | Admitting: Internal Medicine

## 2023-01-31 ENCOUNTER — Other Ambulatory Visit (HOSPITAL_COMMUNITY): Payer: Medicare Other

## 2023-01-31 DIAGNOSIS — M25552 Pain in left hip: Secondary | ICD-10-CM | POA: Diagnosis not present

## 2023-01-31 DIAGNOSIS — M5442 Lumbago with sciatica, left side: Secondary | ICD-10-CM

## 2023-01-31 DIAGNOSIS — M4316 Spondylolisthesis, lumbar region: Secondary | ICD-10-CM | POA: Diagnosis not present

## 2023-01-31 DIAGNOSIS — M545 Low back pain, unspecified: Secondary | ICD-10-CM | POA: Diagnosis not present

## 2023-01-31 DIAGNOSIS — M47816 Spondylosis without myelopathy or radiculopathy, lumbar region: Secondary | ICD-10-CM | POA: Diagnosis not present

## 2023-02-02 NOTE — Progress Notes (Signed)
<><><><><><><><><><><><><><><><><><><><><><><><><><><><><><><><><> <><><><><><><><><><><><><><><><><><><><><><><><><><><><><><><><><>  -   Left Hip X-rays do show mild Arthritis   <><><><><><><><><><><><><><><><><><><><><><><><><><><><><><><><><> <><><><><><><><><><><><><><><><><><><><><><><><><><><><><><><><><>

## 2023-02-07 ENCOUNTER — Other Ambulatory Visit (INDEPENDENT_AMBULATORY_CARE_PROVIDER_SITE_OTHER): Payer: Medicare Other | Admitting: Internal Medicine

## 2023-02-07 DIAGNOSIS — R413 Other amnesia: Secondary | ICD-10-CM

## 2023-02-07 MED ORDER — BUSPIRONE HCL 5 MG PO TABS: ORAL_TABLET | ORAL | 0 refills | Status: DC

## 2023-02-08 ENCOUNTER — Encounter: Payer: Medicare Other | Admitting: Nurse Practitioner

## 2023-02-13 ENCOUNTER — Encounter: Payer: Self-pay | Admitting: Nurse Practitioner

## 2023-02-13 ENCOUNTER — Ambulatory Visit (INDEPENDENT_AMBULATORY_CARE_PROVIDER_SITE_OTHER): Payer: Medicare Other | Admitting: Nurse Practitioner

## 2023-02-13 VITALS — BP 98/62 | HR 112 | Temp 100.8°F | Ht 62.0 in | Wt 145.6 lb

## 2023-02-13 DIAGNOSIS — B351 Tinea unguium: Secondary | ICD-10-CM | POA: Diagnosis not present

## 2023-02-13 DIAGNOSIS — L821 Other seborrheic keratosis: Secondary | ICD-10-CM | POA: Diagnosis not present

## 2023-02-13 DIAGNOSIS — R2681 Unsteadiness on feet: Secondary | ICD-10-CM

## 2023-02-13 DIAGNOSIS — D225 Melanocytic nevi of trunk: Secondary | ICD-10-CM | POA: Diagnosis not present

## 2023-02-13 DIAGNOSIS — Z86007 Personal history of in-situ neoplasm of skin: Secondary | ICD-10-CM | POA: Diagnosis not present

## 2023-02-13 DIAGNOSIS — R509 Fever, unspecified: Secondary | ICD-10-CM

## 2023-02-13 DIAGNOSIS — L570 Actinic keratosis: Secondary | ICD-10-CM | POA: Diagnosis not present

## 2023-02-13 DIAGNOSIS — L718 Other rosacea: Secondary | ICD-10-CM | POA: Diagnosis not present

## 2023-02-13 DIAGNOSIS — E860 Dehydration: Secondary | ICD-10-CM | POA: Diagnosis not present

## 2023-02-13 DIAGNOSIS — Z1152 Encounter for screening for COVID-19: Secondary | ICD-10-CM | POA: Diagnosis not present

## 2023-02-13 DIAGNOSIS — R350 Frequency of micturition: Secondary | ICD-10-CM

## 2023-02-13 DIAGNOSIS — Z08 Encounter for follow-up examination after completed treatment for malignant neoplasm: Secondary | ICD-10-CM | POA: Diagnosis not present

## 2023-02-13 DIAGNOSIS — L814 Other melanin hyperpigmentation: Secondary | ICD-10-CM | POA: Diagnosis not present

## 2023-02-13 DIAGNOSIS — Z85828 Personal history of other malignant neoplasm of skin: Secondary | ICD-10-CM | POA: Diagnosis not present

## 2023-02-13 LAB — POC COVID19 BINAXNOW: SARS Coronavirus 2 Ag: NEGATIVE

## 2023-02-13 NOTE — Patient Instructions (Signed)
Dizziness Dizziness is a common problem. It makes you feel unsteady or light-headed. You may feel like you are about to pass out (faint). Dizziness can lead to getting hurt if you stumble or fall. Dizziness can be caused by many things, including: Medicines. Not having enough water in your body (dehydration). Illness. Follow these instructions at home: Eating and drinking  Drink enough fluid to keep your pee (urine) pale yellow. This helps to keep you from getting dehydrated. Try to drink more clear fluids, such as water. Do not drink alcohol. Limit how much caffeine you drink or eat, if your doctor tells you to do that. Limit how much salt (sodium) you drink or eat, if your doctor tells you to do that. Activity  Avoid making quick movements. Stand up slowly from sitting in a chair, and steady yourself until you feel okay. In the morning, first sit up on the side of the bed. When you feel okay, stand up slowly while you hold onto something. Do this until you know that your balance is okay. If you need to stand in one place for a long time, move your legs often. Tighten and relax the muscles in your legs while you are standing. Do not drive or use machinery if you feel dizzy. Avoid bending down if you feel dizzy. Place items in your home so you can reach them easily without leaning over. Lifestyle Do not smoke or use any products that contain nicotine or tobacco. If you need help quitting, ask your doctor. Try to lower your stress level. You can do this by using methods such as yoga or meditation. Talk with your doctor if you need help. General instructions Watch your dizziness for any changes. Take over-the-counter and prescription medicines only as told by your doctor. Talk with your doctor if you think that you are dizzy because of a medicine that you are taking. Tell a friend or a family member that you are feeling dizzy. If he or she notices any changes in your behavior, have this  person call your doctor. Keep all follow-up visits. Contact a doctor if: Your dizziness does not go away. Your dizziness or light-headedness gets worse. You feel like you may vomit (are nauseous). You have trouble hearing. You have new symptoms. You are unsteady on your feet. You feel like the room is spinning. You have neck pain or a stiff neck. You have a fever. Get help right away if: You vomit or have watery poop (diarrhea), and you cannot eat or drink anything. You have trouble: Talking. Walking. Swallowing. Using your arms, hands, or legs. You feel generally weak. You are not thinking clearly, or you have trouble forming sentences. A friend or family member may notice this. You have: Chest pain. Pain in your belly (abdomen). Shortness of breath. Sweating. Your vision changes. You are bleeding. You have a very bad headache. These symptoms may be an emergency. Get help right away. Call your local emergency services (911 in the [place] Do not wait to see if the symptoms will go away. Do not drive yourself to the hospital. Summary Dizziness makes you feel unsteady or light-headed. You may feel like you are about to pass out (faint). Drink enough fluid to keep your pee (urine) pale yellow. Do not drink alcohol. Avoid making quick movements if you feel dizzy. Watch your dizziness for any changes. This information is not intended to replace advice given to you by your health care provider. Make sure you discuss any questions   you have with your health care provider. Document Revised: 10/25/2020 Document Reviewed: 10/25/2020 Elsevier Patient Education  2023 Elsevier Inc.  

## 2023-02-13 NOTE — Progress Notes (Signed)
Assessment and Plan:  Penny [name] was seen today for a an episodic visit  Diagnoses and all order for this visit:  Unsteady gait Neuro exam WNL Discussed contact guard when walking/use of a walker Change positions slowly.  Urinary frequency  - Urinalysis, Routine w reflex microscopic - Urine Culture  Fever, unspecified fever cause Continue to monitor for increase in fever, chills, N/V.   Report to ER if fever or symptoms worsen.  - POC COVID-19 - Urinalysis, Routine w reflex microscopic - Urine Culture - CBC with Differential/Platelet  Dehydration Push fluids. Advised 2-3L daily  Continue to monitor  - CBC with Differential/Platelet - COMPLETE METABOLIC PANEL WITH GFR    Continue to monitor for any increase in fever, chills, N/V, diarrhea, changes to bowel habits, blood in stool, blood in urine.  Notify office for further evaluation and treatment, questions or concerns if s/s fail to improve. Report to ER if s/s worsen overnight.  The risks and benefits of my recommendations, as well as other treatment options were discussed with the patient today. Questions were answered.  Further disposition pending results of labs. Discussed med's effects and SE's.    Over 20 minutes of exam, counseling, chart review, and critical decision making was performed.   Future Appointments  Date Time Provider [place]  03/14/2023  3:00 PM Buford Dresser, MD [facility]  04/11/2023  9:00 AM Alycia Rossetti, NP [facility] None  04/13/2023 10:00 AM Buford Dresser, MD [facility]    ------------------------------------------------------------------------------------------------------------------   HPI BP 98/62   Pulse (!) 112   Temp (!) 100.8 F (38.2 C)   Ht '5\' 2"'$  (1.575 m)   Wt 145 lb 9.3 oz (66 kg)   SpO2 98%   BMI 26.63 kg/m   89 y.o.female presents for evaluation of feeling off balance.  For the last week she feels as though her walk is  unsteady and feet are wobbly.  She also feels weak in the legs.  She has not fallen.  When walking in the house she feels as though she is leaning forward and her eyes and face want to move forward first, and legs stay wobbly and stuck.  She she does begin to walk her knees begin buckle. She is able to catch herself. Denies pain, headache. vision changes, stiff neck, ringing in ears.  She denies difficulty speaking or moving arms.  She denies being around any one that has been sick or having a recent URI. She is able to sleep without any issues.  She does admit to urinary frequency but no other discomfort.  She feels as though she is staying well hydrated however only drinks 2-3 bottles/cups of water a day.    She is no longer taking Buspar.    Past Medical History:  Diagnosis Date   COPD (chronic obstructive pulmonary disease) with emphysema ([place])    COVID-19 12/09/2019   Hyperlipidemia    Hypertension    Lung cancer, upper lobe ([place]) 12/04/1996   RUL   Osteoporosis    S/P partial lobectomy of lung    [id number]   Thigh shingles 12/04/1996   Thoracic aorta atherosclerosis ([facility])    Vitamin D deficiency      Allergies  Allergen Reactions   Ace Inhibitors Cough   Calcium-Containing Compounds Other (See Comments)    constpation   Lactose Intolerance (Gi) Diarrhea   Polysporin [Bacitracin-Polymyxin B] Rash    Polysporin or neosporin gave her a rash, couldn't remember which  Current Outpatient Medications on File Prior to Visit  Medication Sig   aspirin 81 MG chewable tablet Chew 81 mg by mouth every evening.   busPIRone (BUSPAR) 5 MG tablet Take  1 tablet  2 to 3  x / day for Chronic Anxiety   cetirizine (ZYRTEC) 10 MG tablet Takes 1 tablet Daily (Patient taking differently: Take 10 mg by mouth every evening.)   Cholecalciferol 125 MCG (5000 UT) capsule Take 1 capsules Daily (Patient taking differently: Take 5,000 Units by mouth daily.)   Cyanocobalamin (VITAMIN B-12) 1000 MCG SUBL Takes  1 tab SL Daily (Patient taking differently: Take 1 tablet by mouth daily.)   hydrochlorothiazide (HYDRODIURIL) 12.5 MG tablet Take 1 tablet (12.5 mg total) by mouth daily.   montelukast (SINGULAIR) 10 MG tablet Take  1 tablet  Daily for Allergies                                                            /                            TAKE                      BY                     MOUTH   simvastatin (ZOCOR) 40 MG tablet Take  1 tablet  at Bedtime  for Cholesterol                                             /                                 TAKE                  BY                     MOUTH   dexamethasone (DECADRON) 4 MG tablet Take 1 tab 3 x day - 3 days, then 2 x day - 3 days, then 1 tab daily   No current facility-administered medications on file prior to visit.    ROS: all negative except what is noted in the HPI.   Physical Exam:  BP 98/62   Pulse (!) 112   Temp (!) 100.8 F (38.2 C)   Ht '5\' 2"'$  (1.575 m)   Wt 145 lb 9.3 oz (66 kg)   SpO2 98%   BMI 26.63 kg/m   General Appearance: NAD.  Awake, conversant and cooperative. Eyes: PERRLA, EOMs intact.  Sclera white.  Conjunctiva without erythema. Sinuses: No frontal/maxillary tenderness.  No nasal discharge. Nares patent.  ENT/Mouth: Ext aud canals clear.  Bilateral TMs w/DOL and without erythema or bulging. Hearing intact.  Posterior pharynx without swelling or exudate.  Tonsils without swelling or erythema.  Neck: Supple.  No masses, nodules or thyromegaly. Respiratory: Effort is regular with non-labored breathing. Breath sounds are equal bilaterally without rales, rhonchi, wheezing or stridor.  Cardio: RRR with no MRGs. Brisk peripheral  pulses without edema.  Abdomen: Active BS in all four quadrants.  Soft and non-tender without guarding, rebound tenderness, hernias or masses. Lymphatics: Non tender without lymphadenopathy.  Musculoskeletal: Full ROM, 5/5 strength, normal ambulation.  No clubbing or cyanosis. Skin: Appropriate  color for ethnicity. Warm without rashes, lesions, ecchymosis, ulcers.  Neuro: CN II-XII grossly normal. Normal muscle tone without cerebellar symptoms and intact sensation.   Psych: AO X 3,  appropriate mood and affect, insight and judgment.     Darrol Jump, NP 2:04 PM [facility]

## 2023-02-14 ENCOUNTER — Other Ambulatory Visit: Payer: Self-pay | Admitting: Nurse Practitioner

## 2023-02-14 DIAGNOSIS — N179 Acute kidney failure, unspecified: Secondary | ICD-10-CM

## 2023-02-14 DIAGNOSIS — E876 Hypokalemia: Secondary | ICD-10-CM

## 2023-02-14 LAB — CBC WITH DIFFERENTIAL/PLATELET
Absolute Monocytes: 752 cells/uL (ref 200–950)
Basophils Absolute: 24 cells/uL (ref 0–200)
Basophils Relative: 0.3 %
Eosinophils Absolute: 112 cells/uL (ref 15–500)
Eosinophils Relative: 1.4 %
HCT: 43.5 % (ref 35.0–45.0)
Hemoglobin: 14.2 g/dL (ref 11.7–15.5)
Lymphs Abs: 816 cells/uL — ABNORMAL LOW (ref 850–3900)
MCH: 27.7 pg (ref 27.0–33.0)
MCHC: 32.6 g/dL (ref 32.0–36.0)
MCV: 85 fL (ref 80.0–100.0)
MPV: 10.4 fL (ref 7.5–12.5)
Monocytes Relative: 9.4 %
Neutro Abs: 6296 cells/uL (ref 1500–7800)
Neutrophils Relative %: 78.7 %
Platelets: 158 10*3/uL (ref 140–400)
RBC: 5.12 10*6/uL — ABNORMAL HIGH (ref 3.80–5.10)
RDW: 12.7 % (ref 11.0–15.0)
Total Lymphocyte: 10.2 %
WBC: 8 10*3/uL (ref 3.8–10.8)

## 2023-02-14 LAB — URINALYSIS, ROUTINE W REFLEX MICROSCOPIC
Bilirubin Urine: NEGATIVE
Glucose, UA: NEGATIVE
Hgb urine dipstick: NEGATIVE
Ketones, ur: NEGATIVE
Leukocytes,Ua: NEGATIVE
Nitrite: NEGATIVE
Protein, ur: NEGATIVE
Specific Gravity, Urine: 1.009 (ref 1.001–1.035)
pH: 6.5 (ref 5.0–8.0)

## 2023-02-14 LAB — COMPLETE METABOLIC PANEL WITH GFR
AG Ratio: 1.7 (calc) (ref 1.0–2.5)
ALT: 25 U/L (ref 6–29)
AST: 22 U/L (ref 10–35)
Albumin: 4 g/dL (ref 3.6–5.1)
Alkaline phosphatase (APISO): 81 U/L (ref 37–153)
BUN/Creatinine Ratio: 22 (calc) (ref 6–22)
BUN: 21 mg/dL (ref 7–25)
CO2: 32 mmol/L (ref 20–32)
Calcium: 9.7 mg/dL (ref 8.6–10.4)
Chloride: 99 mmol/L (ref 98–110)
Creat: 0.97 mg/dL — ABNORMAL HIGH (ref 0.60–0.95)
Globulin: 2.3 g/dL (calc) (ref 1.9–3.7)
Glucose, Bld: 126 mg/dL — ABNORMAL HIGH (ref 65–99)
Potassium: 3 mmol/L — ABNORMAL LOW (ref 3.5–5.3)
Sodium: 143 mmol/L (ref 135–146)
Total Bilirubin: 0.7 mg/dL (ref 0.2–1.2)
Total Protein: 6.3 g/dL (ref 6.1–8.1)
eGFR: 57 mL/min/{1.73_m2} — ABNORMAL LOW (ref >=60)

## 2023-02-14 LAB — URINE CULTURE
MICRO NUMBER:: 14682008
SPECIMEN QUALITY:: ADEQUATE

## 2023-02-19 ENCOUNTER — Ambulatory Visit (INDEPENDENT_AMBULATORY_CARE_PROVIDER_SITE_OTHER): Payer: Medicare Other | Admitting: Internal Medicine

## 2023-02-19 ENCOUNTER — Encounter: Payer: Self-pay | Admitting: Internal Medicine

## 2023-02-19 ENCOUNTER — Ambulatory Visit: Payer: Medicare Other

## 2023-02-19 VITALS — BP 110/70 | HR 91 | Temp 97.7°F | Ht 62.0 in | Wt 142.2 lb

## 2023-02-19 DIAGNOSIS — E876 Hypokalemia: Secondary | ICD-10-CM

## 2023-02-19 DIAGNOSIS — G4483 Primary cough headache: Secondary | ICD-10-CM

## 2023-02-19 DIAGNOSIS — Z79899 Other long term (current) drug therapy: Secondary | ICD-10-CM

## 2023-02-19 DIAGNOSIS — N179 Acute kidney failure, unspecified: Secondary | ICD-10-CM

## 2023-02-19 DIAGNOSIS — R2681 Unsteadiness on feet: Secondary | ICD-10-CM | POA: Diagnosis not present

## 2023-02-19 DIAGNOSIS — M791 Myalgia, unspecified site: Secondary | ICD-10-CM

## 2023-02-19 DIAGNOSIS — R531 Weakness: Secondary | ICD-10-CM | POA: Diagnosis not present

## 2023-02-19 LAB — POC COVID19 BINAXNOW: SARS Coronavirus 2 Ag: NEGATIVE

## 2023-02-19 NOTE — Progress Notes (Signed)
Future Appointments  Date Time Provider Department  03/14/2023  3:00 PM Buford Dresser, MD [facility]  04/11/2023                        cpe  9:00 AM Alycia Rossetti, NP [facility]  04/13/2023 10:00 AM Buford Dresser, MD [facility]    History of Present Illness:     This very nice 89 y.o.WWF  with HTN, HLD, Pre-Diabetes and Vitamin D Deficiency is brought in today by her daughter for concerns of patient c/o 1 week prodrome of "extreme fatigue" , generalized aching  arthralgias, extremities & myalgias  lower greater than upper extremities. Relates difficulty standing from a seated position.  Patient does admit recent head & chest congestion & HA. Denies GI or UT sx's.     Current Outpatient Medications on File Prior to Visit  Medication Sig   aspirin 81 MG chewable tablet Takes  every evening.   cetirizine  10 MG tablet Takes 1 tablet Daily    Vitamin D 5000 u Take 1 capsules Daily    VITAMIN B-12)1000 MCG SL Takes 1 tab SL Daily    HCTZ 12.5 mg  Take 1 tablet  daily.   montelukast 10 MG tablet Take  1 tablet  Daily    simvastatin 40 MG tablet Take  1 tablet  at Bedtime       Allergies  Allergen Reactions   Ace Inhibitors Cough   Calcium-Containing Compounds Other (See Comments)    constpation   Lactose Intolerance (Gi) Diarrhea   Polysporin [Bacitracin-Polymyxin B] Rash    Polysporin or neosporin gave her a rash, couldn't remember which     Problem list She has Essential hypertension; Hyperlipidemia, mixed; Personal history of lung cancer; Vitamin D deficiency; COPD (chronic obstructive pulmonary disease) with emphysema ([place]); Atherosclerosis of aorta ([place]) BY ct SCAN IN 2015; Osteoporosis; Abnormal glucose; B12 deficiency; COVID-19 (11/30/2021); Symptomatic sinus bradycardia; Hypokalemia; Hypomagnesemia; CAD (coronary artery disease); Syncope; and Malignant neoplasm of upper lobe of lung, unspecified laterality ([place]) on their problem list.    Observations/Objective:  BP 110/70   Pulse 91   Temp 97.7 F (36.5 C)   Ht 5\' 2"  (1.575 m)   Wt 142 lb 3.2 oz (64.5 kg)   SpO2 97%   BMI 26.01 kg/m   HEENT - WNL. Neck - supple.  Chest - Clear equal BS. Cor - Nl HS. RRR w/o sig MGR. PP 2(+). No edema. MS- FROM w/o deformities.  Gait Nl. Semi-(+) Gower's.  Neuro -  Nl w/o focal abnormalities. Skin - No rash, cyanosis, icterus.   Assessment and Plan:  1. Weakness  - CBC with Differential/Platelet - COMPLETE METABOLIC PANEL WITH GFR - Magnesium - TSH - C-reactive protein - Sedimentation rate - Aldolase - CK  2. Myalgia  - COMPLETE METABOLIC PANEL WITH GFR - C-reactive protein - Sedimentation rate - Aldolase - CK  3. Hypokalemia  - COMPLETE METABOLIC PANEL WITH GFR  4. Unsteady gait  - CBC with Differential/Platelet - COMPLETE METABOLIC PANEL WITH GFR - TSH  5. Cough headache  - POC COVID-19  6. Medication management  - CBC with Differential/Platelet - COMPLETE METABOLIC PANEL WITH GFR - Magnesium - TSH - C-reactive protein - Sedimentation rate - Aldolase - CK   Follow Up Instructions:        I discussed the assessment and treatment plan with the patient & Duaghter.  They wer provided an opportunity to ask  questions and all were answered. They agreed with the plan and demonstrated an understanding of the instructions.       They were advised to call back or seek an in-person evaluation if the symptoms worsen or if the condition fails to improve as anticipated.    Kirtland Bouchard, MD

## 2023-02-20 ENCOUNTER — Other Ambulatory Visit: Payer: Self-pay | Admitting: Internal Medicine

## 2023-02-20 MED ORDER — DEXAMETHASONE 2 MG PO TABS: ORAL_TABLET | ORAL | 0 refills | Status: DC

## 2023-02-20 NOTE — Progress Notes (Signed)
<><><><><><><><><><><><><><><><><><><><><><><><><><><><><><><><><> <><><><><><><><><><><><><><><><><><><><><><><><><><><><><><><><><> -   Test results slightly outside the reference range are not unusual. If there is anything important, I will review this with you,  otherwise it is considered normal test values.  If you have further questions,  please do not hesitate to contact me at the office or via My Chart.  <><><><><><><><><><><><><><><><><><><><><><><><><><><><><><><><><> <><><><><><><><><><><><><><><><><><><><><><><><><><><><><><><><><>  -  Magnesium = 1.6  is very  low- goal is betw 2.0 - 2.5,   - So..............Marland Kitchen  Recommend that you take Magnesium 500 mg 3 x /day with Meals   - also important to eat lots of  leafy green vegetables   - spinach - Kale - collards - greens - okra - asparagus  - broccoli - quinoa - squash - almonds   - black, red, white beans  -  peas - green beans <><><><><><><><><><><><><><><><><><><><><><><><><><><><><><><><><> <><><><><><><><><><><><><><><><><><><><><><><><><><><><><><><><><>  -  CBC, Thyroid, Muscle enzymes Blood Chemistries all Normal & OK   <><><><><><><><><><><><><><><><><><><><><><><><><><><><><><><><><> <><><><><><><><><><><><><><><><><><><><><><><><><><><><><><><><><>  -  But Inflammatory Markers are very elevated             Sed Rate = 36  (Normal is less than 30)                                       &            CRP  = 38.8    Very elevated    (Normal is less than 8.0)   And is very suggestive of a condition frequently seen after Covid or other Infections                                 Known as                        Post Inflammatory PolyArthritis   So -   Sending in a Rx to start steroids  or "cortisone -type meds "   & making a referral to see a specialist  ( Rheumatologist)

## 2023-02-21 LAB — COMPLETE METABOLIC PANEL WITH GFR
AG Ratio: 1.4 (calc) (ref 1.0–2.5)
ALT: 24 U/L (ref 6–29)
AST: 22 U/L (ref 10–35)
Albumin: 3.6 g/dL (ref 3.6–5.1)
Alkaline phosphatase (APISO): 68 U/L (ref 37–153)
BUN: 13 mg/dL (ref 7–25)
CO2: 28 mmol/L (ref 20–32)
Calcium: 9.3 mg/dL (ref 8.6–10.4)
Chloride: 106 mmol/L (ref 98–110)
Creat: 0.75 mg/dL (ref 0.60–0.95)
Globulin: 2.5 g/dL (calc) (ref 1.9–3.7)
Glucose, Bld: 148 mg/dL — ABNORMAL HIGH (ref 65–99)
Potassium: 3.6 mmol/L (ref 3.5–5.3)
Sodium: 145 mmol/L (ref 135–146)
Total Bilirubin: 0.3 mg/dL (ref 0.2–1.2)
Total Protein: 6.1 g/dL (ref 6.1–8.1)
eGFR: 77 mL/min/{1.73_m2} (ref >=60)

## 2023-02-21 LAB — CBC WITH DIFFERENTIAL/PLATELET
Absolute Monocytes: 468 cells/uL (ref 200–950)
Basophils Absolute: 12 cells/uL (ref 0–200)
Basophils Relative: 0.3 %
Eosinophils Absolute: 211 cells/uL (ref 15–500)
Eosinophils Relative: 5.4 %
HCT: 39.2 % (ref 35.0–45.0)
Hemoglobin: 12.6 g/dL (ref 11.7–15.5)
Lymphs Abs: 788 cells/uL — ABNORMAL LOW (ref 850–3900)
MCH: 28 pg (ref 27.0–33.0)
MCHC: 32.1 g/dL (ref 32.0–36.0)
MCV: 87.1 fL (ref 80.0–100.0)
MPV: 10.5 fL (ref 7.5–12.5)
Monocytes Relative: 12 %
Neutro Abs: 2422 cells/uL (ref 1500–7800)
Neutrophils Relative %: 62.1 %
Platelets: 170 10*3/uL (ref 140–400)
RBC: 4.5 10*6/uL (ref 3.80–5.10)
RDW: 12.6 % (ref 11.0–15.0)
Total Lymphocyte: 20.2 %
WBC: 3.9 10*3/uL (ref 3.8–10.8)

## 2023-02-21 LAB — CK: Total CK: 53 U/L (ref 29–143)

## 2023-02-21 LAB — ALDOLASE: Aldolase: 4 U/L (ref <=8.1)

## 2023-02-21 LAB — TSH: TSH: 1.3 mIU/L (ref 0.40–4.50)

## 2023-02-21 LAB — C-REACTIVE PROTEIN: CRP: 38.8 mg/L — ABNORMAL HIGH (ref <8.0)

## 2023-02-21 LAB — MAGNESIUM: Magnesium: 1.6 mg/dL (ref 1.5–2.5)

## 2023-02-21 LAB — SEDIMENTATION RATE: Sed Rate: 36 mm/h — ABNORMAL HIGH (ref 0–30)

## 2023-03-05 DIAGNOSIS — M2550 Pain in unspecified joint: Secondary | ICD-10-CM | POA: Diagnosis not present

## 2023-03-05 DIAGNOSIS — M25552 Pain in left hip: Secondary | ICD-10-CM | POA: Diagnosis not present

## 2023-03-05 DIAGNOSIS — M1990 Unspecified osteoarthritis, unspecified site: Secondary | ICD-10-CM | POA: Diagnosis not present

## 2023-03-05 DIAGNOSIS — M549 Dorsalgia, unspecified: Secondary | ICD-10-CM | POA: Diagnosis not present

## 2023-03-05 DIAGNOSIS — M791 Myalgia, unspecified site: Secondary | ICD-10-CM | POA: Diagnosis not present

## 2023-03-08 DIAGNOSIS — M791 Myalgia, unspecified site: Secondary | ICD-10-CM | POA: Diagnosis not present

## 2023-03-08 DIAGNOSIS — M2550 Pain in unspecified joint: Secondary | ICD-10-CM | POA: Diagnosis not present

## 2023-03-13 NOTE — Progress Notes (Signed)
Cardiology Office Note:    Date:  03/14/2023   ID:  Penny [name], DOB Apr 17, 1933, MRN [id number]  PCP:  Lucky Cowboy, MD  Cardiologist:  Jodelle Red, MD  Referring MD: Lucky Cowboy, MD   CC: follow up  History of Present Illness:    Penny [name] is a 89 y.o. female with a hx of hyperlipidemia, hypertension, COPD, and lung cancer who is seen for the evaluation and management of syncope. I met her during her hospitalization in 09/2022 after a syncopal episode.  Today, she is overall well. She reports weakness and unsteadiness in her legs. She has been feeling "wobbly" but is still able to get up out her seat and move around. She recently tripped over her living room carpet and has had no other falls. Denies hitting her head. She did PT for a while but is currently not doing it. She overall has no limitations when staying active. She has no pain, just weakness.   She reports having walked all around [place] and was able to walk and push the cart around with no problems. She also recently bought a foot pedal machine. She drinks about a gallon of water a day.   She does not monitor her blood pressure at home.   She has been taking steroids prescribed by Dr. Oneta Rack. She has felt significantly better while on steroids.   She is compliant with 81 mg Aspirin, 10 mg Singulair, 40 mg Simvastatin daily. She reports she has a bit of bruising.   She denies any palpitations, chest pain, shortness of breath, or peripheral edema. No lightheadedness, headaches, syncope, orthopnea, or PND.  Past Medical History:  Diagnosis Date   COPD (chronic obstructive pulmonary disease) with emphysema    COVID-19 12/09/2019   Hyperlipidemia    Hypertension    Lung cancer, upper lobe 12/04/1996   RUL   Osteoporosis    S/P partial lobectomy of lung    [id number]   Thigh shingles 12/04/1996   Thoracic aorta atherosclerosis    Vitamin D deficiency     Past Surgical History:   Procedure Laterality Date   CARPAL TUNNEL RELEASE Right    2009   LUNG REMOVAL, PARTIAL Right    RUL 1998   OLECRANON BURSECTOMY Left 03/20/2017   Procedure: EXICISION OLECRANON BURSA LEFT ELBOW;  Surgeon: Cindee Salt, MD;  Location: [facility];  Service: Orthopedics;  Laterality: Left;  with scb block in preop   SHOULDER ADHESION RELEASE Right    2004    Current Medications: Current Outpatient Medications on File Prior to Visit  Medication Sig   aspirin 81 MG chewable tablet Chew 81 mg by mouth every evening.   cetirizine (ZYRTEC) 10 MG tablet Takes 1 tablet Daily (Patient taking differently: Take 10 mg by mouth every evening.)   Cholecalciferol 125 MCG (5000 UT) capsule Take 1 capsules Daily (Patient taking differently: Take 5,000 Units by mouth daily.)   Cyanocobalamin (VITAMIN B-12) 1000 MCG SUBL Takes 1 tab SL Daily (Patient taking differently: Take 1 tablet by mouth daily.)   dexamethasone (DECADRON) 2 MG tablet Take 1 tab 3 x day or as directed   montelukast (SINGULAIR) 10 MG tablet Take  1 tablet  Daily for Allergies                                                            /  TAKE                      BY                     MOUTH   simvastatin (ZOCOR) 40 MG tablet Take  1 tablet  at Bedtime  for Cholesterol                                             /                                 TAKE                  BY                     MOUTH   No current facility-administered medications on file prior to visit.     Allergies:   Ace inhibitors, Calcium-containing compounds, Lactose intolerance (gi), and Polysporin [bacitracin-polymyxin b]   Social History   Tobacco Use   Smoking status: Former    Types: Cigarettes    Quit date: 12/04/1996    Years since quitting: 26.2   Smokeless tobacco: Never  Vaping Use   Vaping Use: Never used  Substance Use Topics   Alcohol use: Yes    Alcohol/week: 1.0 standard drink of alcohol    Types: 1 Glasses  of wine per week    Comment: social   Drug use: No    Family History: family history includes Breast cancer in her paternal aunt; Heart disease in her mother; Lung cancer in her father.  ROS:   Please see the history of present illness.   (+) Weakness (LE unsteadiness) Additional pertinent ROS otherwise unremarkable   EKGs/Labs/Other Studies Reviewed:    The following studies were reviewed today: Monitor 10/2022 Patch Wear Time:  14 days and 0 hours   Patient had a min HR of 58 bpm, max HR of 211 bpm, and avg HR of 86 bpm. Predominant underlying rhythm was Sinus Rhythm. 19 Supraventricular Tachycardia runs occurred, the run with the fastest interval lasting 10.6 secs with a max rate of 211 bpm (avg 162 bpm); the run with the fastest interval was also the longest. No VT, atrial fibrillation, high degree block, or pauses noted. Isolated atrial and ventricular ectopy was rare (<1%). There was 1 triggered event, which was sinus tachycardia. No high risk arrhythmias detected.   Echo 09/11/2022:   1. Left ventricular ejection fraction, by estimation, is 60 to 65%. The  left ventricle has normal function. The left ventricle has no regional  wall motion abnormalities. Left ventricular diastolic parameters are  consistent with Grade I diastolic  dysfunction (impaired relaxation).   2. Right ventricular systolic function is normal. The right ventricular  size is normal.   3. The mitral valve is normal in structure. No evidence of mitral valve  regurgitation. No evidence of mitral stenosis.   4. The aortic valve is tricuspid. There is mild calcification of the  aortic valve. Aortic valve regurgitation is not visualized. Aortic valve  sclerosis is present, with no evidence of aortic valve stenosis. Aortic  valve mean gradient measures 4.0 mmHg.  Aortic valve Vmax measures 1.38 m/s.   5. The inferior vena cava  is normal in size with greater than 50%  respiratory variability, suggesting right  atrial pressure of 3 mmHg.   EKG:  EKG is personally reviewed.   12/12/2022: NSR at 87 bpm  Recent Labs: 02/19/2023: ALT 24; BUN 13; Creat 0.75; Hemoglobin 12.6; Magnesium 1.6; Platelets 170; Potassium 3.6; Sodium 145; TSH 1.30  Recent Lipid Panel    Component Value Date/Time   CHOL 184 01/03/2023 0000   TRIG 88 01/03/2023 0000   HDL 72 01/03/2023 0000   CHOLHDL 2.6 01/03/2023 0000   VLDL 19 02/07/2017 1121   LDLCALC 94 01/03/2023 0000    Physical Exam:    VS:  BP 134/88 (BP Location: Right Arm, Patient Position: Sitting)   Pulse 90   Ht 5\' 2"  (1.575 m)   Wt 144 lb (65.3 kg)   BMI 26.34 kg/m     Wt Readings from Last 3 Encounters:  03/14/23 144 lb (65.3 kg)  02/19/23 142 lb 3.2 oz (64.5 kg)  02/13/23 145 lb 9.3 oz (66 kg)    GEN: Well nourished, well developed in no acute distress HEENT: Normal, moist mucous membranes NECK: No JVD CARDIAC: regular rhythm, normal S1 and S2, no rubs or gallops. No murmur. VASCULAR: Radial and DP pulses 2+ bilaterally. No carotid bruits RESPIRATORY:  Clear to auscultation without rales, wheezing or rhonchi  ABDOMEN: Soft, non-tender, non-distended MUSCULOSKELETAL:  Ambulates independently SKIN: Warm and dry, no edema NEUROLOGIC:  Alert and oriented x 3. No focal neuro deficits noted. PSYCHIATRIC:  Normal affect    ASSESSMENT:    1. Essential hypertension   2. Coronary artery calcification seen on CT scan   3. Mixed hyperlipidemia   4. Aortic atherosclerosis     PLAN:     Hypertension -on HCTZ -recheck numbers improved -with history of syncope, would not be overly aggressive with BP control   Coronary calcification Mixed hyperlipidemia Aortic atherosclerosis -at age 60, unclear significance of this. She is on aspirin and simvastatin. Consider stopping simva if muscle ache doesn't improve  Cardiac risk counseling and prevention recommendations: -recommend heart healthy/Mediterranean diet, with whole grains, fruits, vegetable,  fish, lean meats, nuts, and olive oil. Limit salt. -recommend moderate walking, 3-5 times/week for 30-50 minutes each session. Aim for at least 150 minutes.week. Goal should be pace of 3 miles/hours, or walking 1.5 miles in 30 minutes -recommend avoidance of tobacco products. Avoid excess alcohol.  Prior issues: Syncope Drug induced sinus bradycardia -stopped metoprolol permanently -no recurrent events  Plan for follow up: 3 months, or sooner if needed  Jodelle Red, MD, PhD, [facility]  [facility]    Medication Adjustments/Labs and Tests Ordered: Current medicines are reviewed at length with the patient today.  Concerns regarding medicines are outlined above.  No orders of the defined types were placed in this encounter.  No orders of the defined types were placed in this encounter.  Patient Instructions  Medication Instructions:  The current medical regimen is effective;  continue present plan and medications.   *If you need a refill on your cardiac medications before your next appointment, please call your pharmacy*   Lab Work: N/A  If you have labs (blood work) drawn today and your tests are completely normal, you will receive your results only by: MyChart Message (if you have MyChart) OR A paper copy in the mail If you have any lab test that is abnormal or we need to change your treatment, we will call you to review the results.   Testing/Procedures:  N/A   Follow-Up: At [facility], you and your health needs are our priority.  As part of our continuing mission to provide you with exceptional heart care, we have created designated Provider Care Teams.  These Care Teams include your primary Cardiologist (physician) and Advanced Practice Providers (APPs -  Physician Assistants and Nurse Practitioners) who all work together to provide you with the care you need, when you need it.  We recommend signing up for the patient portal called  "MyChart".  Sign up information is provided on this After Visit Summary.  MyChart is used to connect with patients for Virtual Visits (Telemedicine).  Patients are able to view lab/test results, encounter notes, upcoming appointments, etc.  Non-urgent messages can be sent to your provider as well.   To learn more about what you can do with MyChart, go to [email or web address].    Your next appointment:   3 month(s)  Provider:   Jodelle Red, MD    Other Instructions N/A     I,Rachel Rivera,acting as a scribe for Jodelle Red, MD.,have documented all relevant documentation on the behalf of Jodelle Red, MD,as directed by  Jodelle Red, MD while in the presence of Jodelle Red, MD.  I, Jodelle Red, MD, have reviewed all documentation for this visit. The documentation on 03/29/23 for the exam, diagnosis, procedures, and orders are all accurate and complete.   Signed, Jodelle Red, MD PhD 03/14/2023     [facility]

## 2023-03-14 ENCOUNTER — Encounter (HOSPITAL_BASED_OUTPATIENT_CLINIC_OR_DEPARTMENT_OTHER): Payer: Self-pay | Admitting: Cardiology

## 2023-03-14 ENCOUNTER — Ambulatory Visit (HOSPITAL_BASED_OUTPATIENT_CLINIC_OR_DEPARTMENT_OTHER): Payer: Medicare Other | Admitting: Cardiology

## 2023-03-14 VITALS — BP 134/88 | HR 90 | Ht 62.0 in | Wt 144.0 lb

## 2023-03-14 DIAGNOSIS — I10 Essential (primary) hypertension: Secondary | ICD-10-CM | POA: Diagnosis not present

## 2023-03-14 DIAGNOSIS — E782 Mixed hyperlipidemia: Secondary | ICD-10-CM | POA: Diagnosis not present

## 2023-03-14 DIAGNOSIS — I700 Atherosclerosis of aorta: Secondary | ICD-10-CM | POA: Diagnosis not present

## 2023-03-14 DIAGNOSIS — I2510 Atherosclerotic heart disease of native coronary artery without angina pectoris: Secondary | ICD-10-CM

## 2023-03-14 NOTE — Patient Instructions (Signed)
Medication Instructions:  The current medical regimen is effective;  continue present plan and medications.   *If you need a refill on your cardiac medications before your next appointment, please call your pharmacy*   Lab Work: N/A  If you have labs (blood work) drawn today and your tests are completely normal, you will receive your results only by: MyChart Message (if you have MyChart) OR A paper copy in the mail If you have any lab test that is abnormal or we need to change your treatment, we will call you to review the results.   Testing/Procedures: N/A   Follow-Up: At [facility], you and your health needs are our priority.  As part of our continuing mission to provide you with exceptional heart care, we have created designated Provider Care Teams.  These Care Teams include your primary Cardiologist (physician) and Advanced Practice Providers (APPs -  Physician Assistants and Nurse Practitioners) who all work together to provide you with the care you need, when you need it.  We recommend signing up for the patient portal called "MyChart".  Sign up information is provided on this After Visit Summary.  MyChart is used to connect with patients for Virtual Visits (Telemedicine).  Patients are able to view lab/test results, encounter notes, upcoming appointments, etc.  Non-urgent messages can be sent to your provider as well.   To learn more about what you can do with MyChart, go to [email or web address].    Your next appointment:   3 month(s)  Provider:   Jodelle Red, MD    Other Instructions N/A

## 2023-03-20 NOTE — Progress Notes (Unsigned)
Assessment and Plan:  There are no diagnoses linked to this encounter.    Further disposition pending results of labs. Discussed med's effects and SE's.   Over 30 minutes of exam, counseling, chart review, and critical decision making was performed.   Future Appointments  Date Time Provider Department Center  03/21/2023  9:30 AM Raynelle Dick, NP [facility] None  05/28/2023 11:30 AM Raynelle Dick, NP [facility] None  07/02/2023 11:00 AM Jodelle Red, MD [facility]  09/04/2023 10:00 AM Raynelle Dick, NP [facility] None    ------------------------------------------------------------------------------------------------------------------   HPI There were no vitals taken for this visit. 89 y.o.female presents for  Past Medical History:  Diagnosis Date   COPD (chronic obstructive pulmonary disease) with emphysema    COVID-19 12/09/2019   Hyperlipidemia    Hypertension    Lung cancer, upper lobe 12/04/1996   RUL   Osteoporosis    S/P partial lobectomy of lung    [id number]   Thigh shingles 12/04/1996   Thoracic aorta atherosclerosis    Vitamin D deficiency      Allergies  Allergen Reactions   Ace Inhibitors Cough   Calcium-Containing Compounds Other (See Comments)    constpation   Lactose Intolerance (Gi) Diarrhea   Polysporin [Bacitracin-Polymyxin B] Rash    Polysporin or neosporin gave her a rash, couldn't remember which    Current Outpatient Medications on File Prior to Visit  Medication Sig   aspirin 81 MG chewable tablet Chew 81 mg by mouth every evening.   cetirizine (ZYRTEC) 10 MG tablet Takes 1 tablet Daily (Patient taking differently: Take 10 mg by mouth every evening.)   Cholecalciferol 125 MCG (5000 UT) capsule Take 1 capsules Daily (Patient taking differently: Take 5,000 Units by mouth daily.)   Cyanocobalamin (VITAMIN B-12) 1000 MCG SUBL Takes 1 tab SL Daily (Patient taking differently: Take 1 tablet by mouth daily.)   dexamethasone  (DECADRON) 2 MG tablet Take 1 tab 3 x day or as directed   montelukast (SINGULAIR) 10 MG tablet Take  1 tablet  Daily for Allergies                                                            /                            TAKE                      BY                     MOUTH   simvastatin (ZOCOR) 40 MG tablet Take  1 tablet  at Bedtime  for Cholesterol                                             /                                 TAKE                  BY  MOUTH   No current facility-administered medications on file prior to visit.    ROS: all negative except above.   Physical Exam:  There were no vitals taken for this visit.  General Appearance: Well nourished, in no apparent distress. Eyes: PERRLA, EOMs, conjunctiva no swelling or erythema Sinuses: No Frontal/maxillary tenderness ENT/Mouth: Ext aud canals clear, TMs without erythema, bulging. No erythema, swelling, or exudate on post pharynx.  Tonsils not swollen or erythematous. Hearing normal.  Neck: Supple, thyroid normal.  Respiratory: Respiratory effort normal, BS equal bilaterally without rales, rhonchi, wheezing or stridor.  Cardio: RRR with no MRGs. Brisk peripheral pulses without edema.  Abdomen: Soft, + BS.  Non tender, no guarding, rebound, hernias, masses. Lymphatics: Non tender without lymphadenopathy.  Musculoskeletal: Full ROM, 5/5 strength, normal gait.  Skin: Warm, dry without rashes, lesions, ecchymosis.  Neuro: Cranial nerves intact. Normal muscle tone, no cerebellar symptoms. Sensation intact.  Psych: Awake and oriented X 3, normal affect, Insight and Judgment appropriate.     Raynelle Dick, NP 3:14 PM [facility]

## 2023-03-21 ENCOUNTER — Ambulatory Visit (INDEPENDENT_AMBULATORY_CARE_PROVIDER_SITE_OTHER): Payer: Medicare Other | Admitting: Nurse Practitioner

## 2023-03-21 ENCOUNTER — Encounter: Payer: Self-pay | Admitting: Nurse Practitioner

## 2023-03-21 VITALS — BP 142/78 | HR 103 | Temp 98.1°F | Ht 62.0 in | Wt 142.8 lb

## 2023-03-21 DIAGNOSIS — E782 Mixed hyperlipidemia: Secondary | ICD-10-CM | POA: Diagnosis not present

## 2023-03-21 DIAGNOSIS — M6089 Other myositis, multiple sites: Secondary | ICD-10-CM | POA: Diagnosis not present

## 2023-03-21 DIAGNOSIS — J439 Emphysema, unspecified: Secondary | ICD-10-CM

## 2023-03-21 DIAGNOSIS — R2681 Unsteadiness on feet: Secondary | ICD-10-CM | POA: Diagnosis not present

## 2023-03-21 MED ORDER — BREZTRI AEROSPHERE 160-9-4.8 MCG/ACT IN AERO: 1.0000 | INHALATION_SPRAY | Freq: Two times a day (BID) | RESPIRATORY_TRACT | 3 refills | Status: DC

## 2023-03-21 NOTE — Patient Instructions (Signed)
Polymyositis Polymyositis is a disease that causes inflammation and weakness of the muscles. It can also affect skin tissues. Polymyositis is also known as idiopathic inflammatory myopathy. It is often associated with diseases in which the body mistakenly attacks its own cells and tissues (autoimmune diseases). What are the causes? The cause of polymyositis is not known. What increases the risk? The following factors may make you more likely to develop this condition: Gender. Polymyositis is more common in women. Age. The disease is more common in adults. It is rare before age 20. Ethnicity. It is more common in Black people than White people. What are the signs or symptoms? Symptoms of this condition include: Weakness in your neck, shoulders, upper arms, hips, or thighs. It affects both sides of the body equally. The weakness usually develops over weeks to months. Having difficulty: Rising from a seated position. Climbing stairs. Lifting objects. Reaching overhead. Swallowing. Sore muscles are possible, but often there is little to no pain in the muscles. Fatigue. Fever. Hard bumps under your skin. Unexplained weight loss. How is this diagnosed? This condition may be diagnosed based on your medical history and a physical exam. Various tests may also be done, including: Blood tests to check skeletal muscle enzymes and inflammation. Often, test results that show inflammation are elevated. MRI. EMG (electromyography). This is a test to check the electrical activity of your muscles. Biopsy. This is a test in which a sample of muscle tissue is taken and checked under a microscope. How is this treated? There is no cure for polymyositis, but treatment can improve muscle strength and function. The earlier the treatment is started, the more effective it is. Treatment may include: Corticosteroid medicines to help control inflammation. These medicines have serious side effects. Once your  symptoms are better controlled, the dose should be lowered to find the lowest possible dose that controls your symptoms. Immunoglobulin medicines to introduce healthy antibodies from blood donors. An antibody is a type of protein that is part of the body's disease-fighting system (immune system). Immunosuppressive medicines to control the activity of the immune system. Physical therapy to strengthen muscles. Speech and language therapy to improve your ability to talk and eat. Follow these instructions at home: Safety  Stay active. An exercise routine can help you build and maintain muscle strength. Talk with your health care provider or your physical therapist before you start any exercise program. If necessary, take steps to prevent falls by: Installing grab bars for your tub, shower, and toilet. Installing a pole that goes from floor to ceiling. This helps you when going from a seated to a standing position. Protect your skin from the sun. Wear sunscreen or protective clothing when you are outside. General instructions  Work closely with your health care team, including physical and speech therapists, if needed. Take over-the-counter and prescription medicines only as told by your health care provider. Avoid alcohol. Rest when you are tired. Do not use any products that contain nicotine or tobacco. These products include cigarettes, chewing tobacco, and vaping devices, such as e-cigarettes. If you need help quitting, ask your health care provider. Keep all follow-up visits. This is important. Where to find support Myositis Support and Understanding: understandingmyositis.org The Myositis Association: myositis.org Contact a health care provider if you: Develop new or worsening muscle weakness. Get help right away if you have: Trouble swallowing or speaking. Shortness of breath. These symptoms may represent a serious problem that is an emergency. Do not wait to see if the symptoms   will  go away. Get medical help right away. Call your local emergency services (911 in the [place] Do not drive yourself to the hospital. Summary Polymyositis is a disease that causes inflammation and weakness of your muscles. It can also affect your skin tissues. It is often associated with diseases in which the body attacks its own cells and tissues (autoimmune diseases). The cause of polymyositis is not known. There is no cure for polymyositis, but treatment can improve muscle strength and function. The earlier treatment is started, the more effective it is. This information is not intended to replace advice given to you by your health care provider. Make sure you discuss any questions you have with your health care provider. Document Revised: 03/24/2021 Document Reviewed: 03/24/2021 Elsevier Patient Education  2023 Elsevier Inc.  

## 2023-03-26 NOTE — Therapy (Signed)
OUTPATIENT PHYSICAL THERAPY NEURO EVALUATION   Patient Name: Penny [name] MRN: [id number] DOB:Nov 28, 1933, 89 y.o., female Today's Date: 03/27/2023   PCP: Lucky Cowboy, MD  REFERRING PROVIDER: Raynelle Dick, NP  END OF SESSION:  PT End of Session - 03/27/23 1402     Visit Number 1    Number of Visits 17    Date for PT Re-Evaluation 05/22/23    Authorization Type [employer] Medicare    PT Start Time 1321   pt late   PT Stop Time 1400    PT Time Calculation (min) 39 min    Equipment Utilized During Treatment Gait belt    Activity Tolerance Patient tolerated treatment well    Behavior During Therapy WFL for tasks assessed/performed             Past Medical History:  Diagnosis Date   COPD (chronic obstructive pulmonary disease) with emphysema    COVID-19 12/09/2019   Hyperlipidemia    Hypertension    Lung cancer, upper lobe 12/04/1996   RUL   Osteoporosis    S/P partial lobectomy of lung    [id number]   Thigh shingles 12/04/1996   Thoracic aorta atherosclerosis    Vitamin D deficiency    Past Surgical History:  Procedure Laterality Date   CARPAL TUNNEL RELEASE Right    2009   LUNG REMOVAL, PARTIAL Right    RUL 1998   OLECRANON BURSECTOMY Left 03/20/2017   Procedure: EXICISION OLECRANON BURSA LEFT ELBOW;  Surgeon: Cindee Salt, MD;  Location: [facility];  Service: Orthopedics;  Laterality: Left;  with scb block in preop   SHOULDER ADHESION RELEASE Right    2004   Patient Active Problem List   Diagnosis Date Noted   Malignant neoplasm of upper lobe of lung, unspecified laterality 01/07/2023   Syncope    Symptomatic sinus bradycardia 09/10/2022   Hypokalemia 09/10/2022   Hypomagnesemia 09/10/2022   CAD (coronary artery disease) 09/10/2022   COVID-19 (11/30/2021) 12/01/2021   B12 deficiency 06/28/2020   Abnormal glucose 10/09/2018   Osteoporosis 12/28/2015   COPD (chronic obstructive pulmonary disease) with emphysema 02/25/2015    Atherosclerosis of aorta (HCC) BY ct SCAN IN 2015 02/25/2015   Essential hypertension    Hyperlipidemia, mixed    Vitamin D deficiency    Personal history of lung cancer 12/04/1996    ONSET DATE: end of 2023  REFERRING DIAG: M60.89 (ICD-10-CM) - Other myositis of multiple sites R26.81 (ICD-10-CM) - Unsteady gait  THERAPY DIAG:  Muscle weakness (generalized)  Unsteadiness on feet  Other abnormalities of gait and mobility  Rationale for Evaluation and Treatment: Rehabilitation  SUBJECTIVE:  SUBJECTIVE STATEMENT: Daughter reports that pt is having a particularly good day today. Has had problems with legs not having strength. Denies pain but has had trouble walking for short periods and standing up from a seat. Has been using RW for a couple weeks- pt reports that she struggles with it and was not using AD at [facility]. Had a fall in Dec 2023 when she hit her head and had HHPT. This past month has been worse. Reports occasional dizziness.    Pt accompanied by: family member Daughter  PERTINENT HISTORY: COPD, COVID, HLD, HTN, lung CA s/p partial lobectomy, osteoporosis  PAIN:  Are you having pain? No  PRECAUTIONS: Fall and Other: lung CA, osteoporosis   WEIGHT BEARING RESTRICTIONS: No  FALLS: Has patient fallen in last 6 months? Yes. Number of falls a few  LIVING ENVIRONMENT: Lives with: lives with their daughter and grandson Lives in: House/apartment Stairs:  2 steps to enter with handrail; 1 story home Has following equipment at home: Single point cane, Environmental consultant - 2 wheeled, and Tour manager  PLOF: Independent; part-time job as a Science writer  PATIENT GOALS: improve muscle strength   OBJECTIVE:   DIAGNOSTIC FINDINGS: none recent  COGNITION: Overall cognitive status:  daughter assists  with subjective hx    SENSATION: Daughter reports hx of N/T in B hands from carpal tunnel syndrome  POSTURE: rounded shoulders, forward head, and increased thoracic kyphosis  LOWER EXTREMITY ROM:     Active  Right Eval Left Eval  Hip flexion    Hip extension    Hip abduction    Hip adduction    Hip internal rotation    Hip external rotation    Knee flexion    Knee extension    Ankle dorsiflexion 15 8  Ankle plantarflexion    Ankle inversion    Ankle eversion     (Blank rows = not tested)  LOWER EXTREMITY MMT:    MMT Right Eval Left Eval  Hip flexion 3 3+  Hip extension    Hip abduction 3+ 3+  Hip adduction 4- 4-  Hip internal rotation    Hip external rotation    Knee flexion 4- 4-  Knee extension 4 4  Ankle dorsiflexion 4 4  Ankle plantarflexion 4 4  Ankle inversion    Ankle eversion    (Blank rows = not tested) *patient with retropulsion in sitting during testing    GAIT: Gait pattern: Shuffling gait with poor foot clearance and trunk flexed over walker  Assistive device utilized: Walker - 2 wheeled Level of assistance: SBA  FUNCTIONAL TESTS:   [facility] PT Assessment - 03/27/23 0001       Standardized Balance Assessment   Standardized Balance Assessment Timed Up and Go Test;Five Times Sit to Stand;Berg Balance Test    Five times sit to stand comments  30.01 sec pushing off edge of chair      Berg Balance Test   Sit to Stand Able to stand using hands after several tries    Standing Unsupported Able to stand 2 minutes with supervision    Sitting with Back Unsupported but Feet Supported on Floor or Stool Able to sit safely and securely 2 minutes    Stand to Sit Uses backs of legs against chair to control descent    Transfers Able to transfer safely, definite need of hands    Standing Unsupported with Eyes Closed Able to stand 10 seconds with supervision    Standing Unsupported with Feet Together  Able to place feet together independently and stand for 1  minute with supervision    From Standing, Reach Forward with Outstretched Arm Can reach forward >12 cm safely (5")    From Standing Position, Pick up Object from Floor Able to pick up shoe, needs supervision    From Standing Position, Turn to Look Behind Over each Shoulder Needs supervision when turning    Turn 360 Degrees Needs close supervision or verbal cueing      Timed Up and Go Test   Normal TUG (seconds) 62.23   with RW; required cueing to follow each part of task               TODAY'S TREATMENT:                                                                                                                              DATE: 03/27/23    PATIENT EDUCATION: Education details: prognosis, POC, HEP- to be performed at counter for safety Person educated: Patient and Child(ren) Education method: Explanation, Demonstration, Tactile cues, Verbal cues, and Handouts Education comprehension: verbalized understanding and returned demonstration  HOME EXERCISE PROGRAM: Access Code: [email or web address] URL: [email or web address] Date: 03/27/2023 Prepared by: [facility] - Outpatient  Rehab - [facility]  Exercises - Sit to Stand with Counter Support  - 1 x daily - 5 x weekly - 2 sets - 10 reps - Standing March with Unilateral Counter Support  - 1 x daily - 5 x weekly - 2 sets - 10 reps - Standing Hip Abduction with Unilateral Counter Support  - 1 x daily - 5 x weekly - 2 sets - 10 reps - Standing Gastroc Stretch at Counter  - 1 x daily - 5 x weekly - 2 sets - 30 sec hold  GOALS: Goals reviewed with patient? Yes  SHORT TERM GOALS: Target date: 04/24/2023  Patient to be independent with initial HEP. Baseline: HEP initiated Goal status: INITIAL    LONG TERM GOALS: Target date: 05/22/2023  Patient to be independent with advanced HEP. Baseline: Not yet initiated  Goal status: INITIAL  Patient to complete TUG in <30 sec with LRAD in order to decrease risk of falls.    Baseline: 62 sec Goal status: INITIAL  Patient to demonstrate 5xSTS test in <15 sec in order to decrease risk of falls.  Baseline: 30 sec Goal status: INITIAL  Patient to score at least 46/56 on Berg in order to decrease risk of falls.  Baseline: not completed Goal status: INITIAL  Appropriate 6 minute walk test goal to be made. Baseline: - Goal status: INITIAL  Patient to demonstrate safe use of RW with gait, turns, transfers. Baseline: unsafe currently Goal status: INITIAL  ASSESSMENT:  CLINICAL IMPRESSION:  Patient is an 89 y/o F presenting to OPPT with daughter with c/o imbalance and LE weakness since a fall occurring in December 2023. Patient today presenting with rounded posture, limited L ankle DF AROM,  marked B hip weakness, gait deviations, and sitting/standing imbalance. Patient was educated on gentle strengthening HEP and reported understanding. Prior to current episode, patient was independent. Would benefit from skilled PT services 2 x/week for 8 weeks to address aforementioned impairments in order to optimize level of function.    OBJECTIVE IMPAIRMENTS: Abnormal gait, decreased activity tolerance, decreased balance, difficulty walking, decreased strength, dizziness, impaired flexibility, and postural dysfunction.   ACTIVITY LIMITATIONS: carrying, lifting, bending, sitting, standing, squatting, stairs, transfers, bathing, toileting, dressing, reach over head, hygiene/grooming, and locomotion level  PARTICIPATION LIMITATIONS: meal prep, cleaning, laundry, driving, shopping, community activity, occupation, and church  PERSONAL FACTORS: Age, Behavior pattern, Past/current experiences, Time since onset of injury/illness/exacerbation, and 3+ comorbidities: COPD, COVID, HLD, HTN, lung CA s/p partial lobectomy, osteoporosis  are also affecting patient's functional outcome.   REHAB POTENTIAL: Good  CLINICAL DECISION MAKING: Evolving/moderate complexity  EVALUATION  COMPLEXITY: Moderate  PLAN:  PT FREQUENCY: 2x/week  PT DURATION: 8 weeks  PLANNED INTERVENTIONS: Therapeutic exercises, Therapeutic activity, Neuromuscular re-education, Balance training, Gait training, Patient/Family education, Self Care, Joint mobilization, Stair training, Vestibular training, Canalith repositioning, Aquatic Therapy, Dry Needling, Electrical stimulation, Cryotherapy, Moist heat, Taping, Manual therapy, and Re-evaluation  PLAN FOR NEXT SESSION: complete Berg and 6 MWT; review HEP; progress proximal strengthening and balance; safe use of walker    Anette Guarneri, PT, DPT 03/27/23 5:09 PM  [facility] at [facility] [address] [place], [place] [address] Phone # [phone] Fax # [phone]

## 2023-03-27 ENCOUNTER — Encounter: Payer: Self-pay | Admitting: Physical Therapy

## 2023-03-27 ENCOUNTER — Encounter: Payer: Self-pay | Admitting: Internal Medicine

## 2023-03-27 ENCOUNTER — Other Ambulatory Visit: Payer: Self-pay

## 2023-03-27 ENCOUNTER — Ambulatory Visit: Payer: Medicare Other | Attending: Nurse Practitioner | Admitting: Physical Therapy

## 2023-03-27 DIAGNOSIS — R2681 Unsteadiness on feet: Secondary | ICD-10-CM | POA: Insufficient documentation

## 2023-03-27 DIAGNOSIS — M6281 Muscle weakness (generalized): Secondary | ICD-10-CM | POA: Insufficient documentation

## 2023-03-27 DIAGNOSIS — R2689 Other abnormalities of gait and mobility: Secondary | ICD-10-CM | POA: Insufficient documentation

## 2023-03-27 DIAGNOSIS — M6089 Other myositis, multiple sites: Secondary | ICD-10-CM | POA: Insufficient documentation

## 2023-03-29 ENCOUNTER — Encounter (HOSPITAL_BASED_OUTPATIENT_CLINIC_OR_DEPARTMENT_OTHER): Payer: Self-pay | Admitting: Cardiology

## 2023-03-30 ENCOUNTER — Encounter: Payer: Self-pay | Admitting: Physical Therapy

## 2023-03-30 ENCOUNTER — Ambulatory Visit: Payer: Medicare Other | Admitting: Physical Therapy

## 2023-03-30 DIAGNOSIS — R2689 Other abnormalities of gait and mobility: Secondary | ICD-10-CM

## 2023-03-30 DIAGNOSIS — M6281 Muscle weakness (generalized): Secondary | ICD-10-CM

## 2023-03-30 DIAGNOSIS — R2681 Unsteadiness on feet: Secondary | ICD-10-CM | POA: Diagnosis not present

## 2023-03-30 DIAGNOSIS — M6089 Other myositis, multiple sites: Secondary | ICD-10-CM | POA: Diagnosis not present

## 2023-03-30 NOTE — Therapy (Signed)
OUTPATIENT PHYSICAL THERAPY NEURO EVALUATION   Patient Name: Penny [name] MRN: [id number] DOB:31-Jan-1933, 89 y.o., female Today's Date: 03/30/2023   PCP: Lucky Cowboy, MD  REFERRING PROVIDER: Raynelle Dick, NP  END OF SESSION:  PT End of Session - 03/30/23 1054     Visit Number 2    Number of Visits 17    Date for PT Re-Evaluation 05/22/23    Authorization Type [employer] Medicare    PT Start Time 1057    PT Stop Time 1145    PT Time Calculation (min) 48 min    Equipment Utilized During Treatment Gait belt    Activity Tolerance Patient tolerated treatment well    Behavior During Therapy WFL for tasks assessed/performed              Past Medical History:  Diagnosis Date   COPD (chronic obstructive pulmonary disease) with emphysema (HCC)    COVID-19 12/09/2019   Hyperlipidemia    Hypertension    Lung cancer, upper lobe (HCC) 12/04/1996   RUL   Osteoporosis    S/P partial lobectomy of lung    RUL1998   Thigh shingles 12/04/1996   Thoracic aorta atherosclerosis (HCC)    Vitamin D deficiency    Past Surgical History:  Procedure Laterality Date   CARPAL TUNNEL RELEASE Right    2009   LUNG REMOVAL, PARTIAL Right    RUL 1998   OLECRANON BURSECTOMY Left 03/20/2017   Procedure: EXICISION OLECRANON BURSA LEFT ELBOW;  Surgeon: Cindee Salt, MD;  Location: [facility];  Service: Orthopedics;  Laterality: Left;  with scb block in preop   SHOULDER ADHESION RELEASE Right    2004   Patient Active Problem List   Diagnosis Date Noted   Malignant neoplasm of upper lobe of lung, unspecified laterality (HCC) 01/07/2023   Syncope    Symptomatic sinus bradycardia 09/10/2022   Hypokalemia 09/10/2022   Hypomagnesemia 09/10/2022   CAD (coronary artery disease) 09/10/2022   COVID-19 (11/30/2021) 12/01/2021   B12 deficiency 06/28/2020   Abnormal glucose 10/09/2018   Osteoporosis 12/28/2015   COPD (chronic obstructive pulmonary disease) with emphysema (HCC)  02/25/2015   Atherosclerosis of aorta (HCC) BY ct SCAN IN 2015 02/25/2015   Essential hypertension    Hyperlipidemia, mixed    Vitamin D deficiency    Personal history of lung cancer 12/04/1996    ONSET DATE: end of 2023  REFERRING DIAG: M60.89 (ICD-10-CM) - Other myositis of multiple sites R26.81 (ICD-10-CM) - Unsteady gait  THERAPY DIAG:  Muscle weakness (generalized)  Unsteadiness on feet  Other abnormalities of gait and mobility  Rationale for Evaluation and Treatment: Rehabilitation  SUBJECTIVE:  SUBJECTIVE STATEMENT: Daughter present and reports pt has had 3 falls since eval.  Fell getting up the step into the condo.  Second fall was outside bending down to a pot and fell forward into the pot, had to scoot into the house.  Yesterday was the worst fall trying to get out of the condo/step caught her foot and fell down on the concrete.    Pt accompanied by: family member Daughter  PERTINENT HISTORY: COPD, COVID, HLD, HTN, lung CA s/p partial lobectomy, osteoporosis  PAIN:  Are you having pain? Yes: NPRS scale: 3/10 Pain location: knees and elbow Pain description: sore Aggravating factors: recent fall Relieving factors: sitting  PRECAUTIONS: Fall and Other: lung CA, osteoporosis   WEIGHT BEARING RESTRICTIONS: No  FALLS: Has patient fallen in last 6 months? Yes. Number of falls a few  LIVING ENVIRONMENT: Lives with: lives with their daughter and grandson Lives in: House/apartment Stairs:  2 steps to enter with handrail; 1 story home Has following equipment at home: Single point cane, Environmental consultant - 2 wheeled, and Tour manager  PLOF: Independent; part-time job as a Science writer  PATIENT GOALS: improve muscle strength   OBJECTIVE:    TODAY'S TREATMENT: 03/28/2023 Activity Comments   Berg Balance test: 31/56 Scores <45/56 indicate increased fall risk  Seated ankle dorsiflexion 10 reps   Standing gastroc stretch-review Needs cues for technique     Stair negotiation, simulating steps at home with R rail ascending and L rail descending (holding with BUEs), step to patterns; performed x 3 reps Cues for full foot placement ascending, kick to clear foot descending  Gait training with RW, cues for heelstrike, increased step length, min guard Placed tennis balls on rear legs of walker for ease of gait; pt with forward posture, looking at ground, does not look ahead even with cues    [facility] Adult PT Treatment/Exercise - 03/30/23 0001       Standardized Balance Assessment   Standardized Balance Assessment Berg Balance Test      Berg Balance Test   Sit to Stand Able to stand without using hands and stabilize independently    Standing Unsupported Able to stand safely 2 minutes    Sitting with Back Unsupported but Feet Supported on Floor or Stool Able to sit safely and securely 2 minutes    Stand to Sit Controls descent by using hands    Transfers Able to transfer safely, definite need of hands    Standing Unsupported with Eyes Closed Able to stand 10 seconds with supervision    Standing Ubsupported with Feet Together Able to place feet together independently and stand for 1 minute with supervision    From Standing, Reach Forward with Outstretched Arm Reaches forward but needs supervision    From Standing Position, Pick up Object from Floor Able to pick up shoe, needs supervision    From Standing Position, Turn to Look Behind Over each Shoulder Needs supervision when turning    Turn 360 Degrees Needs close supervision or verbal cueing    Standing Unsupported, Alternately Place Feet on Step/Stool Needs assistance to keep from falling or unable to try    Standing Unsupported, One Foot in Front Needs help to step but can hold 15 seconds    Standing on One Leg Unable to try or needs  assist to prevent fall    Total Score 31             PATIENT EDUCATION: Education details: Discussed mechanism of recent falls (seems  to be forward posture and decreased foot clearance) and discussed optimal gait safety with RW.  Berg results and fall risk; advised pt to use RW at all times (especially when out of the home) Person educated: Patient and Child(ren) Education method: Explanation, Demonstration, and Verbal cues Education comprehension: verbalized understanding, returned demonstration, verbal cues required, and needs further education -------------------------------------- Objective measures below taken at initial eval:  DIAGNOSTIC FINDINGS: none recent  COGNITION: Overall cognitive status:  daughter assists with subjective hx    SENSATION: Daughter reports hx of N/T in B hands from carpal tunnel syndrome  POSTURE: rounded shoulders, forward head, and increased thoracic kyphosis  LOWER EXTREMITY ROM:     Active  Right Eval Left Eval  Hip flexion    Hip extension    Hip abduction    Hip adduction    Hip internal rotation    Hip external rotation    Knee flexion    Knee extension    Ankle dorsiflexion 15 8  Ankle plantarflexion    Ankle inversion    Ankle eversion     (Blank rows = not tested)  LOWER EXTREMITY MMT:    MMT Right Eval Left Eval  Hip flexion 3 3+  Hip extension    Hip abduction 3+ 3+  Hip adduction 4- 4-  Hip internal rotation    Hip external rotation    Knee flexion 4- 4-  Knee extension 4 4  Ankle dorsiflexion 4 4  Ankle plantarflexion 4 4  Ankle inversion    Ankle eversion    (Blank rows = not tested) *patient with retropulsion in sitting during testing    GAIT: Gait pattern: Shuffling gait with poor foot clearance and trunk flexed over walker  Assistive device utilized: Walker - 2 wheeled Level of assistance: SBA  FUNCTIONAL TESTS:        TODAY'S TREATMENT:                                                                                                                               DATE: 03/27/23    PATIENT EDUCATION: Education details: prognosis, POC, HEP- to be performed at counter for safety Person educated: Patient and Child(ren) Education method: Explanation, Demonstration, Tactile cues, Verbal cues, and Handouts Education comprehension: verbalized understanding and returned demonstration  HOME EXERCISE PROGRAM: Access Code: [email or web address] URL: [email or web address] Date: 03/27/2023 Prepared by: [facility] - Outpatient  Rehab - [facility]  Exercises - Sit to Stand with Counter Support  - 1 x daily - 5 x weekly - 2 sets - 10 reps - Standing March with Unilateral Counter Support  - 1 x daily - 5 x weekly - 2 sets - 10 reps - Standing Hip Abduction with Unilateral Counter Support  - 1 x daily - 5 x weekly - 2 sets - 10 reps - Standing Gastroc Stretch at Counter  - 1 x daily - 5 x weekly - 2 sets - 30 sec  hold  GOALS: Goals reviewed with patient? Yes  SHORT TERM GOALS: Target date: 04/24/2023  Patient to be independent with initial HEP. Baseline: HEP initiated Goal status: IN PROGRESS    LONG TERM GOALS: Target date: 05/22/2023  Patient to be independent with advanced HEP. Baseline: Not yet initiated  Goal status: IN PROGRESS  Patient to complete TUG in <30 sec with LRAD in order to decrease risk of falls.   Baseline: 62 sec Goal status: IN PROGRESS  Patient to demonstrate 5xSTS test in <15 sec in order to decrease risk of falls.  Baseline: 30 sec Goal status: IN PROGRESS  Patient to score at least 46/56 on Berg in order to decrease risk of falls.  Baseline: not completed Goal status: IN PROGRESS  Appropriate 6 minute walk test goal to be made. Baseline: - Goal status: IN PROGRESS  Patient to demonstrate safe use of RW with gait, turns, transfers. Baseline: unsafe currently Goal status: IN PROGRESS   ASSESSMENT:  CLINICAL IMPRESSION: Pt returns today  for first visit since evaluation, with daughter reporting 3 falls this week.  Discussed mechanism of falls, with 2 falls being on steps due to foot catching on step, 3rd fall was leaning forward to a plant in a pot, falling forward.  Worked today to address foot clearance with gait and foot clearance with steps, with pt requiring frequent cues for foot clearance, with preference being very little SLS with gait.  Daughter present for education during session.  Pt will continue to benefit from skilled PT to further address balance, gait, strength, and to reduce falls.  OBJECTIVE IMPAIRMENTS: Abnormal gait, decreased activity tolerance, decreased balance, difficulty walking, decreased strength, dizziness, impaired flexibility, and postural dysfunction.   ACTIVITY LIMITATIONS: carrying, lifting, bending, sitting, standing, squatting, stairs, transfers, bathing, toileting, dressing, reach over head, hygiene/grooming, and locomotion level  PARTICIPATION LIMITATIONS: meal prep, cleaning, laundry, driving, shopping, community activity, occupation, and church  PERSONAL FACTORS: Age, Behavior pattern, Past/current experiences, Time since onset of injury/illness/exacerbation, and 3+ comorbidities: COPD, COVID, HLD, HTN, lung CA s/p partial lobectomy, osteoporosis  are also affecting patient's functional outcome.   REHAB POTENTIAL: Good  CLINICAL DECISION MAKING: Evolving/moderate complexity  EVALUATION COMPLEXITY: Moderate  PLAN:  PT FREQUENCY: 2x/week  PT DURATION: 8 weeks  PLANNED INTERVENTIONS: Therapeutic exercises, Therapeutic activity, Neuromuscular re-education, Balance training, Gait training, Patient/Family education, Self Care, Joint mobilization, Stair training, Vestibular training, Canalith repositioning, Aquatic Therapy, Dry Needling, Electrical stimulation, Cryotherapy, Moist heat, Taping, Manual therapy, and Re-evaluation  PLAN FOR NEXT SESSION: Work on stairs, foot clearance with gait,  HEP review.  Continue to progress proximal strengthening and balance; safe use of walker.  (Did not complete 6 MWT yet due to going over safety concerns with pt's recent falls)   Lonia Blood, PT 03/30/23 12:05 PM Phone: [phone] Fax: [phone]   [facility] [address] [place], [place] [address] Phone # [phone] Fax # [phone]

## 2023-04-02 NOTE — Therapy (Signed)
OUTPATIENT PHYSICAL THERAPY NEURO TREATMENT   Patient Name: Penny [name] MRN: [id number] DOB:[birth year], 89 y.o., female Today's Date: 04/03/2023   PCP: Lucky Cowboy, MD  REFERRING PROVIDER: Raynelle Dick, NP  END OF SESSION:  PT End of Session - 04/03/23 1400     Visit Number 3    Number of Visits 17    Date for PT Re-Evaluation 05/22/23    Authorization Type [employer]HC Medicare    PT Start Time 1317    PT Stop Time 1400    PT Time Calculation (min) 43 min    Equipment Utilized During Treatment Gait belt    Activity Tolerance Patient tolerated treatment well    Behavior During Therapy WFL for tasks assessed/performed               Past Medical History:  Diagnosis Date   COPD (chronic obstructive pulmonary disease) with emphysema (HCC)    COVID-19 12/09/2019   Hyperlipidemia    Hypertension    Lung cancer, upper lobe (HCC) 12/04/1996   RUL   Osteoporosis    S/P partial lobectomy of lung    RUL1998   Thigh shingles 12/04/1996   Thoracic aorta atherosclerosis (HCC)    Vitamin D deficiency    Past Surgical History:  Procedure Laterality Date   CARPAL TUNNEL RELEASE Right    2009   LUNG REMOVAL, PARTIAL Right    RUL 1998   OLECRANON BURSECTOMY Left 03/20/2017   Procedure: EXICISION OLECRANON BURSA LEFT ELBOW;  Surgeon: Cindee Salt, MD;  Location: [facility];  Service: Orthopedics;  Laterality: Left;  with scb block in preop   SHOULDER ADHESION RELEASE Right    2004   Patient Active Problem List   Diagnosis Date Noted   Malignant neoplasm of upper lobe of lung, unspecified laterality (HCC) 01/07/2023   Syncope    Symptomatic sinus bradycardia 09/10/2022   Hypokalemia 09/10/2022   Hypomagnesemia 09/10/2022   CAD (coronary artery disease) 09/10/2022   COVID-19 (11/30/2021) 12/01/2021   B12 deficiency 06/28/2020   Abnormal glucose 10/09/2018   Osteoporosis 12/28/2015   COPD (chronic obstructive pulmonary disease) with emphysema (HCC)  02/25/2015   Atherosclerosis of aorta (HCC) BY ct SCAN IN 2015 02/25/2015   Essential hypertension    Hyperlipidemia, mixed    Vitamin D deficiency    Personal history of lung cancer 12/04/1996    ONSET DATE: end of 2023  REFERRING DIAG: M60.89 (ICD-10-CM) - Other myositis of multiple sites R26.81 (ICD-10-CM) - Unsteady gait  THERAPY DIAG:  Muscle weakness (generalized)  Unsteadiness on feet  Other abnormalities of gait and mobility  Rationale for Evaluation and Treatment: Rehabilitation  SUBJECTIVE:  SUBJECTIVE STATEMENT: Denies any falls since last session.   Pt accompanied by: family member Daughter  PERTINENT HISTORY: COPD, COVID, HLD, HTN, lung CA s/p partial lobectomy, osteoporosis  PAIN:  Are you having pain? Yes: NPRS scale: 0/10 Pain location: knees and elbow Pain description: sore Aggravating factors: recent fall Relieving factors: sitting  PRECAUTIONS: Fall and Other: lung CA, osteoporosis   WEIGHT BEARING RESTRICTIONS: No  FALLS: Has patient fallen in last 6 months? Yes. Number of falls a few  LIVING ENVIRONMENT: Lives with: lives with their daughter and grandson Lives in: House/apartment Stairs:  2 steps to enter with handrail; 1 story home Has following equipment at home: Single point cane, Environmental consultant - 2 wheeled, and Tour manager  PLOF: Independent; part-time job as a Science writer  PATIENT GOALS: improve muscle strength   OBJECTIVE:     TODAY'S TREATMENT: 04/03/23 Activity Comments  Edu on safe maneuvering of RW to sit down/stand up   6 minute walk test Pre: 148/94 mmHg, 98 bpm, 93 % ; RPE: 6-7/20  Post: 138/100 mmHg, 93%, 103 bpm RPE: 11/20  Completed 335 feet with RW  Gait training with RW x41ft Cuing to avoid pushing RW too far ahead, increase B step  length, heel toe pattern; heavy cueing to look ~15 ft out in front to avoid looking down at feet and keeping feet b/w walker wheels during turns especially   HEP review: STS at counter 10x  standing march 20x standing hip ABD 10x each LE Review for proper form. Posterior pelvic tilt and anterior trunk lean evident. Focused on 3 steps for set up for safe STS in front of counter.      PATIENT EDUCATION: Education details: edu on handout for proper STS technique  Person educated: Patient and Child(ren) Education method: Explanation, Demonstration, Tactile cues, Verbal cues, and Handouts Education comprehension: verbalized understanding and returned demonstration    -------------------------------------- Objective measures below taken at initial eval:  DIAGNOSTIC FINDINGS: none recent  COGNITION: Overall cognitive status:  daughter assists with subjective hx    SENSATION: Daughter reports hx of N/T in B hands from carpal tunnel syndrome  POSTURE: rounded shoulders, forward head, and increased thoracic kyphosis  LOWER EXTREMITY ROM:     Active  Right Eval Left Eval  Hip flexion    Hip extension    Hip abduction    Hip adduction    Hip internal rotation    Hip external rotation    Knee flexion    Knee extension    Ankle dorsiflexion 15 8  Ankle plantarflexion    Ankle inversion    Ankle eversion     (Blank rows = not tested)  LOWER EXTREMITY MMT:    MMT Right Eval Left Eval  Hip flexion 3 3+  Hip extension    Hip abduction 3+ 3+  Hip adduction 4- 4-  Hip internal rotation    Hip external rotation    Knee flexion 4- 4-  Knee extension 4 4  Ankle dorsiflexion 4 4  Ankle plantarflexion 4 4  Ankle inversion    Ankle eversion    (Blank rows = not tested) *patient with retropulsion in sitting during testing    GAIT: Gait pattern: Shuffling gait with poor foot clearance and trunk flexed over walker  Assistive device utilized: Walker - 2 wheeled Level of  assistance: SBA  FUNCTIONAL TESTS:        TODAY'S TREATMENT:  DATE: 03/27/23    PATIENT EDUCATION: Education details: prognosis, POC, HEP- to be performed at counter for safety Person educated: Patient and Child(ren) Education method: Explanation, Demonstration, Tactile cues, Verbal cues, and Handouts Education comprehension: verbalized understanding and returned demonstration  HOME EXERCISE PROGRAM: Access Code: [email or web address] URL: [email or web address] Date: 03/27/2023 Prepared by: [facility] - Outpatient  Rehab - [facility]  Exercises - Sit to Stand with Counter Support  - 1 x daily - 5 x weekly - 2 sets - 10 reps - Standing March with Unilateral Counter Support  - 1 x daily - 5 x weekly - 2 sets - 10 reps - Standing Hip Abduction with Unilateral Counter Support  - 1 x daily - 5 x weekly - 2 sets - 10 reps - Standing Gastroc Stretch at Counter  - 1 x daily - 5 x weekly - 2 sets - 30 sec hold  GOALS: Goals reviewed with patient? Yes  SHORT TERM GOALS: Target date: 04/24/2023  Patient to be independent with initial HEP. Baseline: HEP initiated Goal status: IN PROGRESS    LONG TERM GOALS: Target date: 05/22/2023  Patient to be independent with advanced HEP. Baseline: Not yet initiated  Goal status: IN PROGRESS  Patient to complete TUG in <30 sec with LRAD in order to decrease risk of falls.   Baseline: 62 sec Goal status: IN PROGRESS  Patient to demonstrate 5xSTS test in <15 sec in order to decrease risk of falls.  Baseline: 30 sec Goal status: IN PROGRESS  Patient to score at least 46/56 on Berg in order to decrease risk of falls.  Baseline: not completed Goal status: IN PROGRESS  Patient to complete 500 feet during 6 MWT with LRAD. Baseline: 335 feet  Goal status: IN PROGRESS  Patient to demonstrate safe use of RW with  gait, turns, transfers. Baseline: unsafe currently Goal status: IN PROGRESS   ASSESSMENT:  CLINICAL IMPRESSION: Patient arrived to session without new complaints. Denies falls since last session. Patient completed 335 feet with RW during MWT today. Gait training focused on addressing deviations and promoting max safety. Reviewed HEP for max understanding- patient required increased cues for proper STS form. Provided info on this for pt/family. No complaints at end of session.   OBJECTIVE IMPAIRMENTS: Abnormal gait, decreased activity tolerance, decreased balance, difficulty walking, decreased strength, dizziness, impaired flexibility, and postural dysfunction.   ACTIVITY LIMITATIONS: carrying, lifting, bending, sitting, standing, squatting, stairs, transfers, bathing, toileting, dressing, reach over head, hygiene/grooming, and locomotion level  PARTICIPATION LIMITATIONS: meal prep, cleaning, laundry, driving, shopping, community activity, occupation, and church  PERSONAL FACTORS: Age, Behavior pattern, Past/current experiences, Time since onset of injury/illness/exacerbation, and 3+ comorbidities: COPD, COVID, HLD, HTN, lung CA s/p partial lobectomy, osteoporosis  are also affecting patient's functional outcome.   REHAB POTENTIAL: Good  CLINICAL DECISION MAKING: Evolving/moderate complexity  EVALUATION COMPLEXITY: Moderate  PLAN:  PT FREQUENCY: 2x/week  PT DURATION: 8 weeks  PLANNED INTERVENTIONS: Therapeutic exercises, Therapeutic activity, Neuromuscular re-education, Balance training, Gait training, Patient/Family education, Self Care, Joint mobilization, Stair training, Vestibular training, Canalith repositioning, Aquatic Therapy, Dry Needling, Electrical stimulation, Cryotherapy, Moist heat, Taping, Manual therapy, and Re-evaluation  PLAN FOR NEXT SESSION: Work on stairs, foot clearance with gait, HEP review.  Continue to progress proximal strengthening and balance; safe use of  walker.    Anette Guarneri, PT, DPT 04/03/23 2:02 PM  [facility] at [facility] [address] [place], [address] [place], [place] [address] Phone # [phone] Fax # [phone])  [phone]    

## 2023-04-03 ENCOUNTER — Ambulatory Visit: Payer: Medicare Other | Admitting: Physical Therapy

## 2023-04-03 ENCOUNTER — Encounter: Payer: Self-pay | Admitting: Physical Therapy

## 2023-04-03 DIAGNOSIS — M6281 Muscle weakness (generalized): Secondary | ICD-10-CM | POA: Diagnosis not present

## 2023-04-03 DIAGNOSIS — R2689 Other abnormalities of gait and mobility: Secondary | ICD-10-CM | POA: Diagnosis not present

## 2023-04-03 DIAGNOSIS — M6089 Other myositis, multiple sites: Secondary | ICD-10-CM | POA: Diagnosis not present

## 2023-04-03 DIAGNOSIS — R2681 Unsteadiness on feet: Secondary | ICD-10-CM | POA: Diagnosis not present

## 2023-04-03 NOTE — Patient Instructions (Signed)
Sit to Stand Transfers:  Scoot out to the edge of the chair Place your feet flat on the floor, shoulder width apart.  Make sure your feet are tucked just under your knees. Lean forward (nose over toes) with momentum, and stand up tall with your best posture.  If you need to use your arms, use them as a quick boost up to stand. If you are in a low or soft chair, you can lean back and then forward up to stand, in order to get more momentum. Once you are standing, make sure you are looking ahead and standing tall.  To sit down:  Back up until you feel the chair behind your legs. Bend at your hips, reaching  Back for you chair, if needed, then slowly squat to sit down on your chair.  

## 2023-04-04 NOTE — Therapy (Signed)
OUTPATIENT PHYSICAL THERAPY NEURO TREATMENT   Patient Name: Penny [name] MRN: [id number] DOB:[birth year], 89 y.o., female Today's Date: 04/05/2023   PCP: Lucky Cowboy, MD  REFERRING PROVIDER: Raynelle Dick, NP  END OF SESSION:  PT End of Session - 04/05/23 1704     Visit Number 4    Number of Visits 17    Date for PT Re-Evaluation 05/22/23    Authorization Type [employer] Medicare    PT Start Time 1620    PT Stop Time 1701    PT Time Calculation (min) 41 min    Equipment Utilized During Treatment Gait belt    Activity Tolerance Patient tolerated treatment well    Behavior During Therapy WFL for tasks assessed/performed                Past Medical History:  Diagnosis Date   COPD (chronic obstructive pulmonary disease) with emphysema (HCC)    COVID-19 12/09/2019   Hyperlipidemia    Hypertension    Lung cancer, upper lobe (HCC) 12/04/1996   RUL   Osteoporosis    S/P partial lobectomy of lung    RUL1998   Thigh shingles 12/04/1996   Thoracic aorta atherosclerosis (HCC)    Vitamin D deficiency    Past Surgical History:  Procedure Laterality Date   CARPAL TUNNEL RELEASE Right    2009   LUNG REMOVAL, PARTIAL Right    RUL 1998   OLECRANON BURSECTOMY Left 03/20/2017   Procedure: EXICISION OLECRANON BURSA LEFT ELBOW;  Surgeon: Cindee Salt, MD;  Location: [facility];  Service: Orthopedics;  Laterality: Left;  with scb block in preop   SHOULDER ADHESION RELEASE Right    2004   Patient Active Problem List   Diagnosis Date Noted   Malignant neoplasm of upper lobe of lung, unspecified laterality (HCC) 01/07/2023   Syncope    Symptomatic sinus bradycardia 09/10/2022   Hypokalemia 09/10/2022   Hypomagnesemia 09/10/2022   CAD (coronary artery disease) 09/10/2022   COVID-19 (11/30/2021) 12/01/2021   B12 deficiency 06/28/2020   Abnormal glucose 10/09/2018   Osteoporosis 12/28/2015   COPD (chronic obstructive pulmonary disease) with emphysema (HCC)  02/25/2015   Atherosclerosis of aorta (HCC) BY ct SCAN IN 2015 02/25/2015   Essential hypertension    Hyperlipidemia, mixed    Vitamin D deficiency    Personal history of lung cancer 12/04/1996    ONSET DATE: end of 2023  REFERRING DIAG: M60.89 (ICD-10-CM) - Other myositis of multiple sites R26.81 (ICD-10-CM) - Unsteady gait  THERAPY DIAG:  Muscle weakness (generalized)  Unsteadiness on feet  Other abnormalities of gait and mobility  Rationale for Evaluation and Treatment: Rehabilitation  SUBJECTIVE:  SUBJECTIVE STATEMENT: Feeling "ehh." "Like it's an attitude problem." Denies recent falls. Reports that she is not doing her exercises my "I don't feel like doing   Pt accompanied by: family member Daughter  PERTINENT HISTORY: COPD, COVID, HLD, HTN, lung CA s/p partial lobectomy, osteoporosis  PAIN:  Are you having pain? Yes: NPRS scale: 0/10 Pain location: knees and elbow Pain description: sore Aggravating factors: recent fall Relieving factors: sitting  PRECAUTIONS: Fall and Other: lung CA, osteoporosis   WEIGHT BEARING RESTRICTIONS: No  FALLS: Has patient fallen in last 6 months? Yes. Number of falls a few  LIVING ENVIRONMENT: Lives with: lives with their daughter and grandson Lives in: House/apartment Stairs:  2 steps to enter with handrail; 1 story home Has following equipment at home: Single point cane, Environmental consultant - 2 wheeled, and Tour manager  PLOF: Independent; part-time job as a Science writer  PATIENT GOALS: improve muscle strength   OBJECTIVE:    TODAY'S TREATMENT: 04/05/23 Activity Comments  Sitting march 2# 20x Cueing for proper form and positioning throughout    Sitting LAQ 2# 10x each   Sitting clam red TB 10x   Sitting ball squeeze 10x5"   STS x7 Cueing and  assisting pt in remembering sequence of standing up      HOME EXERCISE PROGRAM Last updated: 04/05/23 Access Code: [email or web address] URL: [email or web address] Date: 04/05/2023 Prepared by: [facility] - Outpatient  Rehab - [facility]  Exercises - Sit to Stand with Counter Support  - 1 x daily - 5 x weekly - 2 sets - 10 reps - Standing March with Unilateral Counter Support  - 1 x daily - 5 x weekly - 2 sets - 10 reps - Standing Hip Abduction with Unilateral Counter Support  - 1 x daily - 5 x weekly - 2 sets - 10 reps - Standing Gastroc Stretch at Counter  - 1 x daily - 5 x weekly - 2 sets - 30 sec hold - Seated March  - 1 x daily - 5 x weekly - 2 sets - 10 reps - Seated Long Arc Quad  - 1 x daily - 5 x weekly - 2 sets - 10 reps - Seated Hip Abduction  - 1 x daily - 5 x weekly - 2 sets - 10 reps - Seated Hip Adduction Isometrics with Ball  - 1 x daily - 5 x weekly - 2 sets - 10 reps - 5 sec hold  PATIENT EDUCATION: Education details: spoke to pt about her report of low mood and motivation and recommended she speak to PCP about possible meds to address this, HEP update with seated exercises that patient can perform safely while daughter is at work  Person educated: Patient and Psychologist, sport and exercise) Education method: Programmer, multimedia, Demonstration, Actor cues, Verbal cues, and Handouts Education comprehension: verbalized understanding and returned demonstration     -------------------------------------- Objective measures below taken at initial eval:  DIAGNOSTIC FINDINGS: none recent  COGNITION: Overall cognitive status:  daughter assists with subjective hx    SENSATION: Daughter reports hx of N/T in B hands from carpal tunnel syndrome  POSTURE: rounded shoulders, forward head, and increased thoracic kyphosis  LOWER EXTREMITY ROM:     Active  Right Eval Left Eval  Hip flexion    Hip extension    Hip abduction    Hip adduction    Hip internal rotation    Hip external  rotation    Knee flexion    Knee extension    Ankle dorsiflexion 15  8  Ankle plantarflexion    Ankle inversion    Ankle eversion     (Blank rows = not tested)  LOWER EXTREMITY MMT:    MMT Right Eval Left Eval  Hip flexion 3 3+  Hip extension    Hip abduction 3+ 3+  Hip adduction 4- 4-  Hip internal rotation    Hip external rotation    Knee flexion 4- 4-  Knee extension 4 4  Ankle dorsiflexion 4 4  Ankle plantarflexion 4 4  Ankle inversion    Ankle eversion    (Blank rows = not tested) *patient with retropulsion in sitting during testing    GAIT: Gait pattern: Shuffling gait with poor foot clearance and trunk flexed over walker  Assistive device utilized: Walker - 2 wheeled Level of assistance: SBA  FUNCTIONAL TESTS:        TODAY'S TREATMENT:                                                                                                                              DATE: 03/27/23    PATIENT EDUCATION: Education details: prognosis, POC, HEP- to be performed at counter for safety Person educated: Patient and Child(ren) Education method: Explanation, Demonstration, Tactile cues, Verbal cues, and Handouts Education comprehension: verbalized understanding and returned demonstration  HOME EXERCISE PROGRAM: Access Code: [email or web address] URL: [email or web address] Date: 03/27/2023 Prepared by: [facility] - Outpatient  Rehab - [facility]  Exercises - Sit to Stand with Counter Support  - 1 x daily - 5 x weekly - 2 sets - 10 reps - Standing March with Unilateral Counter Support  - 1 x daily - 5 x weekly - 2 sets - 10 reps - Standing Hip Abduction with Unilateral Counter Support  - 1 x daily - 5 x weekly - 2 sets - 10 reps - Standing Gastroc Stretch at Counter  - 1 x daily - 5 x weekly - 2 sets - 30 sec hold  GOALS: Goals reviewed with patient? Yes  SHORT TERM GOALS: Target date: 04/24/2023  Patient to be independent with initial HEP. Baseline: HEP  initiated Goal status: IN PROGRESS    LONG TERM GOALS: Target date: 05/22/2023  Patient to be independent with advanced HEP. Baseline: Not yet initiated  Goal status: IN PROGRESS  Patient to complete TUG in <30 sec with LRAD in order to decrease risk of falls.   Baseline: 62 sec Goal status: IN PROGRESS  Patient to demonstrate 5xSTS test in <15 sec in order to decrease risk of falls.  Baseline: 30 sec Goal status: IN PROGRESS  Patient to score at least 46/56 on Berg in order to decrease risk of falls.  Baseline: not completed Goal status: IN PROGRESS  Patient to complete 500 feet during 6 MWT with LRAD. Baseline: 335 feet  Goal status: IN PROGRESS  Patient to demonstrate safe use of RW with gait, turns, transfers. Baseline: unsafe currently Goal status: IN PROGRESS  ASSESSMENT:  CLINICAL IMPRESSION: Patient arrived to session with daughter with report of low mood and frustration concerning her physical condition. Also admits to poor HEP adherence d/t low motivation and d/t fear of performing on her own while daughter is at work. Updated HEP with seated strengthening LE exercises to be safely done on her own, while continuing to perform standing HEP with daughter's supervision. Patient's cognition limits carryover of cues with exercises, today especially with STS. Will require consistent review of activities for max carryover.    OBJECTIVE IMPAIRMENTS: Abnormal gait, decreased activity tolerance, decreased balance, difficulty walking, decreased strength, dizziness, impaired flexibility, and postural dysfunction.   ACTIVITY LIMITATIONS: carrying, lifting, bending, sitting, standing, squatting, stairs, transfers, bathing, toileting, dressing, reach over head, hygiene/grooming, and locomotion level  PARTICIPATION LIMITATIONS: meal prep, cleaning, laundry, driving, shopping, community activity, occupation, and church  PERSONAL FACTORS: Age, Behavior pattern, Past/current  experiences, Time since onset of injury/illness/exacerbation, and 3+ comorbidities: COPD, COVID, HLD, HTN, lung CA s/p partial lobectomy, osteoporosis  are also affecting patient's functional outcome.   REHAB POTENTIAL: Good  CLINICAL DECISION MAKING: Evolving/moderate complexity  EVALUATION COMPLEXITY: Moderate  PLAN:  PT FREQUENCY: 2x/week  PT DURATION: 8 weeks  PLANNED INTERVENTIONS: Therapeutic exercises, Therapeutic activity, Neuromuscular re-education, Balance training, Gait training, Patient/Family education, Self Care, Joint mobilization, Stair training, Vestibular training, Canalith repositioning, Aquatic Therapy, Dry Needling, Electrical stimulation, Cryotherapy, Moist heat, Taping, Manual therapy, and Re-evaluation  PLAN FOR NEXT SESSION: Work on stairs, foot clearance with gait, HEP review.  Continue to progress proximal strengthening and balance; safe use of walker.    Anette Guarneri, PT, DPT 04/05/23 5:05 PM  [facility] at [facility] [address] [place], [address] [place], [place] [address] Phone # [phone] Fax # [phone]

## 2023-04-05 ENCOUNTER — Ambulatory Visit: Payer: Medicare Other | Attending: Nurse Practitioner | Admitting: Physical Therapy

## 2023-04-05 ENCOUNTER — Encounter: Payer: Self-pay | Admitting: Physical Therapy

## 2023-04-05 DIAGNOSIS — M6281 Muscle weakness (generalized): Secondary | ICD-10-CM | POA: Insufficient documentation

## 2023-04-05 DIAGNOSIS — R2689 Other abnormalities of gait and mobility: Secondary | ICD-10-CM | POA: Diagnosis not present

## 2023-04-05 DIAGNOSIS — R2681 Unsteadiness on feet: Secondary | ICD-10-CM | POA: Diagnosis not present

## 2023-04-10 ENCOUNTER — Ambulatory Visit: Payer: Medicare Other | Admitting: Physical Therapy

## 2023-04-10 ENCOUNTER — Encounter: Payer: Self-pay | Admitting: Physical Therapy

## 2023-04-10 DIAGNOSIS — M6281 Muscle weakness (generalized): Secondary | ICD-10-CM | POA: Diagnosis not present

## 2023-04-10 DIAGNOSIS — R2689 Other abnormalities of gait and mobility: Secondary | ICD-10-CM

## 2023-04-10 DIAGNOSIS — R2681 Unsteadiness on feet: Secondary | ICD-10-CM

## 2023-04-10 NOTE — Therapy (Signed)
OUTPATIENT PHYSICAL THERAPY NEURO TREATMENT   Patient Name: Penny [name] MRN: [id number] DOB:1933/12/01, 89 y.o., female Today's Date: 04/10/2023   PCP: Lucky Cowboy, MD  REFERRING PROVIDER: Raynelle Dick, NP  END OF SESSION:  PT End of Session - 04/10/23 1026     Visit Number 5    Number of Visits 17    Date for PT Re-Evaluation 05/22/23    Authorization Type [employer]HC Medicare    PT Start Time 1019    PT Stop Time 1057    PT Time Calculation (min) 38 min    Activity Tolerance Patient tolerated treatment well    Behavior During Therapy WFL for tasks assessed/performed                 Past Medical History:  Diagnosis Date   COPD (chronic obstructive pulmonary disease) with emphysema (HCC)    COVID-19 12/09/2019   Hyperlipidemia    Hypertension    Lung cancer, upper lobe (HCC) 12/04/1996   RUL   Osteoporosis    S/P partial lobectomy of lung    RUL1998   Thigh shingles 12/04/1996   Thoracic aorta atherosclerosis (HCC)    Vitamin D deficiency    Past Surgical History:  Procedure Laterality Date   CARPAL TUNNEL RELEASE Right    2009   LUNG REMOVAL, PARTIAL Right    RUL 1998   OLECRANON BURSECTOMY Left 03/20/2017   Procedure: EXICISION OLECRANON BURSA LEFT ELBOW;  Surgeon: Cindee Salt, MD;  Location: [facility];  Service: Orthopedics;  Laterality: Left;  with scb block in preop   SHOULDER ADHESION RELEASE Right    2004   Patient Active Problem List   Diagnosis Date Noted   Malignant neoplasm of upper lobe of lung, unspecified laterality (HCC) 01/07/2023   Syncope    Symptomatic sinus bradycardia 09/10/2022   Hypokalemia 09/10/2022   Hypomagnesemia 09/10/2022   CAD (coronary artery disease) 09/10/2022   COVID-19 (11/30/2021) 12/01/2021   B12 deficiency 06/28/2020   Abnormal glucose 10/09/2018   Osteoporosis 12/28/2015   COPD (chronic obstructive pulmonary disease) with emphysema (HCC) 02/25/2015   Atherosclerosis of aorta (HCC) BY  ct SCAN IN 2015 02/25/2015   Essential hypertension    Hyperlipidemia, mixed    Vitamin D deficiency    Personal history of lung cancer 12/04/1996    ONSET DATE: end of 2023  REFERRING DIAG: M60.89 (ICD-10-CM) - Other myositis of multiple sites R26.81 (ICD-10-CM) - Unsteady gait  THERAPY DIAG:  Muscle weakness (generalized)  Unsteadiness on feet  Other abnormalities of gait and mobility  Rationale for Evaluation and Treatment: Rehabilitation  SUBJECTIVE:  SUBJECTIVE STATEMENT: Feeling OK this morning. Like to be 100% better. Nothing new going on, when I walked yesterday I pulled a muscle and it bothered me all afternoon but this morning I was a little stiff but its OK now.  Per daughter, she tumbled over the coffee table yesterday. I feel funny when I bend over to get something.   Pt accompanied by: family member Daughter  PERTINENT HISTORY: COPD, COVID, HLD, HTN, lung CA s/p partial lobectomy, osteoporosis  PAIN:  Are you having pain? Yes: NPRS scale: 0/10 Pain location:   Pain description:   Aggravating factors:   Relieving factors:    PRECAUTIONS: Fall and Other: lung CA, osteoporosis   WEIGHT BEARING RESTRICTIONS: No  FALLS: Has patient fallen in last 6 months? Yes. Number of falls a few  LIVING ENVIRONMENT: Lives with: lives with their daughter and grandson Lives in: House/apartment Stairs:  2 steps to enter with handrail; 1 story home Has following equipment at home: Single point cane, Environmental consultant - 2 wheeled, and Tour manager  PLOF: Independent; part-time job as a Science writer  PATIENT GOALS: improve muscle strength   OBJECTIVE:     TODAY'S TREATMENT 04/10/23  TherEx  Nustep L2 x6 minutes BLEs only  LAQs 3# 2x10 B Gait training in clinic x126ft (2 rounds) around gym  3# each LE, Mod multimodal cues for foot clearance/step length and proximity to walker especially with obstacle navigation Seated hip flexion 3# 2x10 B Standing HS curls 3# each LE B  Lots of extra education on safe RW use, obstacle navigation, etc, recommended use of RW instead of 4WW     TODAY'S TREATMENT: 04/05/23 Activity Comments  Sitting march 2# 20x Cueing for proper form and positioning throughout    Sitting LAQ 2# 10x each   Sitting clam red TB 10x   Sitting ball squeeze 10x5"   STS x7 Cueing and assisting pt in remembering sequence of standing up      HOME EXERCISE PROGRAM Last updated: 04/05/23 Access Code: [email or web address] URL: [email or web address] Date: 04/05/2023 Prepared by: [facility] - Outpatient  Rehab - [facility]  Exercises - Sit to Stand with Counter Support  - 1 x daily - 5 x weekly - 2 sets - 10 reps - Standing March with Unilateral Counter Support  - 1 x daily - 5 x weekly - 2 sets - 10 reps - Standing Hip Abduction with Unilateral Counter Support  - 1 x daily - 5 x weekly - 2 sets - 10 reps - Standing Gastroc Stretch at Counter  - 1 x daily - 5 x weekly - 2 sets - 30 sec hold - Seated March  - 1 x daily - 5 x weekly - 2 sets - 10 reps - Seated Long Arc Quad  - 1 x daily - 5 x weekly - 2 sets - 10 reps - Seated Hip Abduction  - 1 x daily - 5 x weekly - 2 sets - 10 reps - Seated Hip Adduction Isometrics with Ball  - 1 x daily - 5 x weekly - 2 sets - 10 reps - 5 sec hold  PATIENT EDUCATION: Education details: spoke to pt about her report of low mood and motivation and recommended she speak to PCP about possible meds to address this, HEP update with seated exercises that patient can perform safely while daughter is at work  Person educated: Patient and Child(ren) Education method: Explanation, Facilities manager, Actor cues, Verbal cues, and Handouts Education comprehension: verbalized understanding and  returned demonstration      -------------------------------------- Objective measures below taken at initial eval:  DIAGNOSTIC FINDINGS: none recent  COGNITION: Overall cognitive status:  daughter assists with subjective hx    SENSATION: Daughter reports hx of N/T in B hands from carpal tunnel syndrome  POSTURE: rounded shoulders, forward head, and increased thoracic kyphosis  LOWER EXTREMITY ROM:     Active  Right Eval Left Eval  Hip flexion    Hip extension    Hip abduction    Hip adduction    Hip internal rotation    Hip external rotation    Knee flexion    Knee extension    Ankle dorsiflexion 15 8  Ankle plantarflexion    Ankle inversion    Ankle eversion     (Blank rows = not tested)  LOWER EXTREMITY MMT:    MMT Right Eval Left Eval  Hip flexion 3 3+  Hip extension    Hip abduction 3+ 3+  Hip adduction 4- 4-  Hip internal rotation    Hip external rotation    Knee flexion 4- 4-  Knee extension 4 4  Ankle dorsiflexion 4 4  Ankle plantarflexion 4 4  Ankle inversion    Ankle eversion    (Blank rows = not tested) *patient with retropulsion in sitting during testing    GAIT: Gait pattern: Shuffling gait with poor foot clearance and trunk flexed over walker  Assistive device utilized: Walker - 2 wheeled Level of assistance: SBA  FUNCTIONAL TESTS:        TODAY'S TREATMENT:                                                                                                                              DATE: 03/27/23    PATIENT EDUCATION: Education details: prognosis, POC, HEP- to be performed at counter for safety Person educated: Patient and Child(ren) Education method: Explanation, Demonstration, Tactile cues, Verbal cues, and Handouts Education comprehension: verbalized understanding and returned demonstration  HOME EXERCISE PROGRAM: Access Code: [email or web address] URL: [email or web address] Date: 03/27/2023 Prepared by: [facility] - Outpatient  Rehab - [facility]  Exercises - Sit to Stand with Counter Support  - 1 x daily - 5 x weekly - 2 sets - 10 reps - Standing March with Unilateral Counter Support  - 1 x daily - 5 x weekly - 2 sets - 10 reps - Standing Hip Abduction with Unilateral Counter Support  - 1 x daily - 5 x weekly - 2 sets - 10 reps - Standing Gastroc Stretch at Counter  - 1 x daily - 5 x weekly - 2 sets - 30 sec hold  GOALS: Goals reviewed with patient? Yes  SHORT TERM GOALS: Target date: 04/24/2023  Patient to be independent with initial HEP. Baseline: HEP initiated Goal status: IN PROGRESS    LONG TERM GOALS: Target date: 05/22/2023  Patient to be independent with advanced HEP. Baseline: Not yet initiated  Goal  status: IN PROGRESS  Patient to complete TUG in <30 sec with LRAD in order to decrease risk of falls.   Baseline: 62 sec Goal status: IN PROGRESS  Patient to demonstrate 5xSTS test in <15 sec in order to decrease risk of falls.  Baseline: 30 sec Goal status: IN PROGRESS  Patient to score at least 46/56 on Berg in order to decrease risk of falls.  Baseline: not completed Goal status: IN PROGRESS  Patient to complete 500 feet during 6 MWT with LRAD. Baseline: 335 feet  Goal status: IN PROGRESS  Patient to demonstrate safe use of RW with gait, turns, transfers. Baseline: unsafe currently Goal status: IN PROGRESS   ASSESSMENT:  CLINICAL IMPRESSION:  Landrie arrives today doing OK, had a lot better attitude towards exercise/activity today. Continued with exercise/activity progressions as tolerated, also encouraged fall prevention at home. Will continue efforts.   OBJECTIVE IMPAIRMENTS: Abnormal gait, decreased activity tolerance, decreased balance, difficulty walking, decreased strength, dizziness, impaired flexibility, and postural dysfunction.   ACTIVITY LIMITATIONS: carrying, lifting, bending, sitting, standing, squatting, stairs, transfers, bathing, toileting, dressing, reach over head,  hygiene/grooming, and locomotion level  PARTICIPATION LIMITATIONS: meal prep, cleaning, laundry, driving, shopping, community activity, occupation, and church  PERSONAL FACTORS: Age, Behavior pattern, Past/current experiences, Time since onset of injury/illness/exacerbation, and 3+ comorbidities: COPD, COVID, HLD, HTN, lung CA s/p partial lobectomy, osteoporosis  are also affecting patient's functional outcome.   REHAB POTENTIAL: Good  CLINICAL DECISION MAKING: Evolving/moderate complexity  EVALUATION COMPLEXITY: Moderate  PLAN:  PT FREQUENCY: 2x/week  PT DURATION: 8 weeks  PLANNED INTERVENTIONS: Therapeutic exercises, Therapeutic activity, Neuromuscular re-education, Balance training, Gait training, Patient/Family education, Self Care, Joint mobilization, Stair training, Vestibular training, Canalith repositioning, Aquatic Therapy, Dry Needling, Electrical stimulation, Cryotherapy, Moist heat, Taping, Manual therapy, and Re-evaluation  PLAN FOR NEXT SESSION: Work on stairs, foot clearance with gait, HEP review.  Continue to progress proximal strengthening and balance; safe use of walker.    Nedra Hai PT DPT PN2   [facility] at [facility] [address] [place], [address] [place], [place] [address] Phone # [phone] Fax # [phone]

## 2023-04-11 ENCOUNTER — Encounter: Payer: Medicare Other | Admitting: Nurse Practitioner

## 2023-04-12 ENCOUNTER — Encounter: Payer: Self-pay | Admitting: Physical Therapy

## 2023-04-12 ENCOUNTER — Ambulatory Visit: Payer: Medicare Other | Admitting: Physical Therapy

## 2023-04-12 DIAGNOSIS — R2681 Unsteadiness on feet: Secondary | ICD-10-CM | POA: Diagnosis not present

## 2023-04-12 DIAGNOSIS — M6281 Muscle weakness (generalized): Secondary | ICD-10-CM

## 2023-04-12 DIAGNOSIS — R2689 Other abnormalities of gait and mobility: Secondary | ICD-10-CM

## 2023-04-12 NOTE — Therapy (Signed)
OUTPATIENT PHYSICAL THERAPY NEURO TREATMENT   Patient Name: Penny [name] MRN: [id number] DOB:[birth year], 89 y.o., female Today's Date: 04/12/2023   PCP: Lucky Cowboy, MD  REFERRING PROVIDER: Raynelle Dick, NP  END OF SESSION:  PT End of Session - 04/12/23 0850     Visit Number 6    Number of Visits 17    Date for PT Re-Evaluation 05/22/23    Authorization Type [employer] Medicare    PT Start Time 0846    PT Stop Time 0928    PT Time Calculation (min) 42 min    Activity Tolerance Patient tolerated treatment well    Behavior During Therapy [facility] for tasks assessed/performed                  Past Medical History:  Diagnosis Date   COPD (chronic obstructive pulmonary disease) with emphysema (HCC)    COVID-19 12/09/2019   Hyperlipidemia    Hypertension    Lung cancer, upper lobe (HCC) 12/04/1996   RUL   Osteoporosis    S/P partial lobectomy of lung    RUL1998   Thigh shingles 12/04/1996   Thoracic aorta atherosclerosis (HCC)    Vitamin D deficiency    Past Surgical History:  Procedure Laterality Date   CARPAL TUNNEL RELEASE Right    2009   LUNG REMOVAL, PARTIAL Right    RUL 1998   OLECRANON BURSECTOMY Left 03/20/2017   Procedure: EXICISION OLECRANON BURSA LEFT ELBOW;  Surgeon: Cindee Salt, MD;  Location: [facility];  Service: Orthopedics;  Laterality: Left;  with scb block in preop   SHOULDER ADHESION RELEASE Right    2004   Patient Active Problem List   Diagnosis Date Noted   Malignant neoplasm of upper lobe of lung, unspecified laterality (HCC) 01/07/2023   Syncope    Symptomatic sinus bradycardia 09/10/2022   Hypokalemia 09/10/2022   Hypomagnesemia 09/10/2022   CAD (coronary artery disease) 09/10/2022   COVID-19 (11/30/2021) 12/01/2021   B12 deficiency 06/28/2020   Abnormal glucose 10/09/2018   Osteoporosis 12/28/2015   COPD (chronic obstructive pulmonary disease) with emphysema (HCC) 02/25/2015   Atherosclerosis of aorta (HCC)  BY ct SCAN IN 2015 02/25/2015   Essential hypertension    Hyperlipidemia, mixed    Vitamin D deficiency    Personal history of lung cancer 12/04/1996    ONSET DATE: end of 2023  REFERRING DIAG: M60.89 (ICD-10-CM) - Other myositis of multiple sites R26.81 (ICD-10-CM) - Unsteady gait  THERAPY DIAG:  Muscle weakness (generalized)  Unsteadiness on feet  Other abnormalities of gait and mobility  Rationale for Evaluation and Treatment: Rehabilitation  SUBJECTIVE:  SUBJECTIVE STATEMENT:  My low back is feeling kind of funny, I don't now if I stretched it too far trying to walk without the walker. Nothing new, no more falls since Tuesday. Legs are OK, wasn't too sore after last session.   Pt accompanied by: family member Daughter  PERTINENT HISTORY: COPD, COVID, HLD, HTN, lung CA s/p partial lobectomy, osteoporosis  PAIN:  Are you having pain? No 0/10, just very mild discomfort in low back   PRECAUTIONS: Fall and Other: lung CA, osteoporosis   WEIGHT BEARING RESTRICTIONS: No  FALLS: Has patient fallen in last 6 months? Yes. Number of falls a few  LIVING ENVIRONMENT: Lives with: lives with their daughter and grandson Lives in: House/apartment Stairs:  2 steps to enter with handrail; 1 story home Has following equipment at home: Single point cane, Environmental consultant - 2 wheeled, and Tour manager  PLOF: Independent; part-time job as a Science writer  PATIENT GOALS: improve muscle strength   OBJECTIVE:    TODAY'S TREATMENT 04/12/23  TherEx  Nustep L2 x6 minutes BLEs only LAQs x15  B 3# each LE  Side steps along edge of mat table 3# each LE x2 laps mod cues for RW management Gait  382ft 3# each LE Mod cues for gait mechanics, safe use of RW, upright posture MAX CUES for transfers/safe transfer  mechanics during session TUG 39 seconds RW, 18 seconds no device/min guard Able to maintain balance walking with horizontal and vertical head turns no device      TODAY'S TREATMENT 04/10/23  TherEx  Nustep L2 x6 minutes BLEs only  LAQs 3# 2x10 B Gait training in clinic x163ft (2 rounds) around gym 3# each LE, Mod multimodal cues for foot clearance/step length and proximity to walker especially with obstacle navigation Seated hip flexion 3# 2x10 B Standing HS curls 3# each LE B  Lots of extra education on safe RW use, obstacle navigation, etc, recommended use of RW instead of 4WW     HOME EXERCISE PROGRAM Last updated: 04/05/23 Access Code: [email or web address] URL: [email or web address] Date: 04/05/2023 Prepared by: [facility] - Outpatient  Rehab - [facility]  Exercises - Sit to Stand with Counter Support  - 1 x daily - 5 x weekly - 2 sets - 10 reps - Standing March with Unilateral Counter Support  - 1 x daily - 5 x weekly - 2 sets - 10 reps - Standing Hip Abduction with Unilateral Counter Support  - 1 x daily - 5 x weekly - 2 sets - 10 reps - Standing Gastroc Stretch at Counter  - 1 x daily - 5 x weekly - 2 sets - 30 sec hold - Seated March  - 1 x daily - 5 x weekly - 2 sets - 10 reps - Seated Long Arc Quad  - 1 x daily - 5 x weekly - 2 sets - 10 reps - Seated Hip Abduction  - 1 x daily - 5 x weekly - 2 sets - 10 reps - Seated Hip Adduction Isometrics with Ball  - 1 x daily - 5 x weekly - 2 sets - 10 reps - 5 sec hold  PATIENT EDUCATION: Education details: spoke to pt about her report of low mood and motivation and recommended she speak to PCP about possible meds to address this, HEP update with seated exercises that patient can perform safely while daughter is at work  Person educated: Patient and Child(ren) Education method: Explanation, Facilities manager, Actor cues, Verbal cues, and Handouts Education comprehension: verbalized  understanding and returned demonstration      -------------------------------------- Objective measures below taken at initial eval:  DIAGNOSTIC FINDINGS: none recent  COGNITION: Overall cognitive status:  daughter assists with subjective hx    SENSATION: Daughter reports hx of N/T in B hands from carpal tunnel syndrome  POSTURE: rounded shoulders, forward head, and increased thoracic kyphosis  LOWER EXTREMITY ROM:     Active  Right Eval Left Eval  Hip flexion    Hip extension    Hip abduction    Hip adduction    Hip internal rotation    Hip external rotation    Knee flexion    Knee extension    Ankle dorsiflexion 15 8  Ankle plantarflexion    Ankle inversion    Ankle eversion     (Blank rows = not tested)  LOWER EXTREMITY MMT:    MMT Right Eval Left Eval  Hip flexion 3 3+  Hip extension    Hip abduction 3+ 3+  Hip adduction 4- 4-  Hip internal rotation    Hip external rotation    Knee flexion 4- 4-  Knee extension 4 4  Ankle dorsiflexion 4 4  Ankle plantarflexion 4 4  Ankle inversion    Ankle eversion    (Blank rows = not tested) *patient with retropulsion in sitting during testing    GAIT: Gait pattern: Shuffling gait with poor foot clearance and trunk flexed over walker  Assistive device utilized: Walker - 2 wheeled Level of assistance: SBA  FUNCTIONAL TESTS:        TODAY'S TREATMENT:                                                                                                                              DATE: 03/27/23    PATIENT EDUCATION: Education details: prognosis, POC, HEP- to be performed at counter for safety Person educated: Patient and Child(ren) Education method: Explanation, Demonstration, Tactile cues, Verbal cues, and Handouts Education comprehension: verbalized understanding and returned demonstration  HOME EXERCISE PROGRAM: Access Code: [email or web address] URL: [email or web address] Date: 03/27/2023 Prepared by: [facility] - Outpatient  Rehab - [facility]  Exercises - Sit to Stand with Counter Support  - 1 x daily - 5 x weekly - 2 sets - 10 reps - Standing March with Unilateral Counter Support  - 1 x daily - 5 x weekly - 2 sets - 10 reps - Standing Hip Abduction with Unilateral Counter Support  - 1 x daily - 5 x weekly - 2 sets - 10 reps - Standing Gastroc Stretch at Counter  - 1 x daily - 5 x weekly - 2 sets - 30 sec hold  GOALS: Goals reviewed with patient? Yes  SHORT TERM GOALS: Target date: 04/24/2023  Patient to be independent with initial HEP. Baseline: HEP initiated Goal status: IN PROGRESS    LONG TERM GOALS: Target date: 05/22/2023  Patient to be independent with advanced HEP. Baseline: Not yet initiated  Goal status: IN PROGRESS  Patient to complete TUG in <30 sec with LRAD in order to decrease risk of falls.   Baseline: 62 sec Goal status: IN PROGRESS  Patient to demonstrate 5xSTS test in <15 sec in order to decrease risk of falls.  Baseline: 30 sec Goal status: IN PROGRESS  Patient to score at least 46/56 on Berg in order to decrease risk of falls.  Baseline: not completed Goal status: IN PROGRESS  Patient to complete 500 feet during 6 MWT with LRAD. Baseline: 335 feet  Goal status: IN PROGRESS  Patient to demonstrate safe use of RW with gait, turns, transfers. Baseline: unsafe currently Goal status: IN PROGRESS   ASSESSMENT:  CLINICAL IMPRESSION:  Orena arrives today doing well, seems to have tolerated last PT session well overall. Continued to encourage consistent RW use at home- she has been trying to get around her home without It, strongly encouraged consistent use of assistive device due to high fall risk/repeated falls in the past. Progressed all activities from last session as tolerated today, will continue efforts. Continued to encourage safe activity at home with family and LRAD to tolerance. Actually did better on TUG test and dynamic balance tests without RW today, may benefit from a  cane moving forward.    OBJECTIVE IMPAIRMENTS: Abnormal gait, decreased activity tolerance, decreased balance, difficulty walking, decreased strength, dizziness, impaired flexibility, and postural dysfunction.   ACTIVITY LIMITATIONS: carrying, lifting, bending, sitting, standing, squatting, stairs, transfers, bathing, toileting, dressing, reach over head, hygiene/grooming, and locomotion level  PARTICIPATION LIMITATIONS: meal prep, cleaning, laundry, driving, shopping, community activity, occupation, and church  PERSONAL FACTORS: Age, Behavior pattern, Past/current experiences, Time since onset of injury/illness/exacerbation, and 3+ comorbidities: COPD, COVID, HLD, HTN, lung CA s/p partial lobectomy, osteoporosis  are also affecting patient's functional outcome.   REHAB POTENTIAL: Good  CLINICAL DECISION MAKING: Evolving/moderate complexity  EVALUATION COMPLEXITY: Moderate  PLAN:  PT FREQUENCY: 2x/week  PT DURATION: 8 weeks  PLANNED INTERVENTIONS: Therapeutic exercises, Therapeutic activity, Neuromuscular re-education, Balance training, Gait training, Patient/Family education, Self Care, Joint mobilization, Stair training, Vestibular training, Canalith repositioning, Aquatic Therapy, Dry Needling, Electrical stimulation, Cryotherapy, Moist heat, Taping, Manual therapy, and Re-evaluation  PLAN FOR NEXT SESSION: Work on stairs, foot clearance with gait, HEP review.  Continue to progress proximal strengthening and balance; try gait training with cane instead of RW   Nedra Hai PT DPT PN2   [facility] at [facility] [address] [place], [address] [place], [place] [address] Phone # [phone] Fax # [phone]

## 2023-04-12 NOTE — Therapy (Signed)
OUTPATIENT PHYSICAL THERAPY NEURO TREATMENT   Patient Name: Penny [name] MRN: [id number] DOB:1932/12/27, 89 y.o., female Today's Date: 04/16/2023   PCP: Lucky Cowboy, MD  REFERRING PROVIDER: Raynelle Dick, NP  END OF SESSION:  PT End of Session - 04/16/23 1230     Visit Number 7    Number of Visits 17    Date for PT Re-Evaluation 05/22/23    Authorization Type [employer]HC Medicare    PT Start Time 1148    PT Stop Time 1229    PT Time Calculation (min) 41 min    Equipment Utilized During Treatment Gait belt    Activity Tolerance Patient tolerated treatment well    Behavior During Therapy WFL for tasks assessed/performed                   Past Medical History:  Diagnosis Date   COPD (chronic obstructive pulmonary disease) with emphysema (HCC)    COVID-19 12/09/2019   Hyperlipidemia    Hypertension    Lung cancer, upper lobe (HCC) 12/04/1996   RUL   Osteoporosis    S/P partial lobectomy of lung    RUL1998   Thigh shingles 12/04/1996   Thoracic aorta atherosclerosis (HCC)    Vitamin D deficiency    Past Surgical History:  Procedure Laterality Date   CARPAL TUNNEL RELEASE Right    2009   LUNG REMOVAL, PARTIAL Right    RUL 1998   OLECRANON BURSECTOMY Left 03/20/2017   Procedure: EXICISION OLECRANON BURSA LEFT ELBOW;  Surgeon: Cindee Salt, MD;  Location: [facility];  Service: Orthopedics;  Laterality: Left;  with scb block in preop   SHOULDER ADHESION RELEASE Right    2004   Patient Active Problem List   Diagnosis Date Noted   Malignant neoplasm of upper lobe of lung, unspecified laterality (HCC) 01/07/2023   Syncope    Symptomatic sinus bradycardia 09/10/2022   Hypokalemia 09/10/2022   Hypomagnesemia 09/10/2022   CAD (coronary artery disease) 09/10/2022   COVID-19 (11/30/2021) 12/01/2021   B12 deficiency 06/28/2020   Abnormal glucose 10/09/2018   Osteoporosis 12/28/2015   COPD (chronic obstructive pulmonary disease) with  emphysema (HCC) 02/25/2015   Atherosclerosis of aorta (HCC) BY ct SCAN IN 2015 02/25/2015   Essential hypertension    Hyperlipidemia, mixed    Vitamin D deficiency    Personal history of lung cancer 12/04/1996    ONSET DATE: end of 2023  REFERRING DIAG: M60.89 (ICD-10-CM) - Other myositis of multiple sites R26.81 (ICD-10-CM) - Unsteady gait  THERAPY DIAG:  Muscle weakness (generalized)  Unsteadiness on feet  Other abnormalities of gait and mobility  Rationale for Evaluation and Treatment: Rehabilitation  SUBJECTIVE:  SUBJECTIVE STATEMENT: Denies falls.   Pt accompanied by: family member Daughter  PERTINENT HISTORY: COPD, COVID, HLD, HTN, lung CA s/p partial lobectomy, osteoporosis  PAIN:  Are you having pain? No   PRECAUTIONS: Fall and Other: lung CA, osteoporosis   WEIGHT BEARING RESTRICTIONS: No  FALLS: Has patient fallen in last 6 months? Yes. Number of falls a few  LIVING ENVIRONMENT: Lives with: lives with their daughter and grandson Lives in: House/apartment Stairs:  2 steps to enter with handrail; 1 story home Has following equipment at home: Single point cane, Environmental consultant - 2 wheeled, Marine scientist  PLOF: Independent; part-time job as a Science writer  PATIENT GOALS: improve muscle strength   OBJECTIVE:     TODAY'S TREATMENT: 04/16/23 Activity Comments  Nustep L3 x UEs/LEs Cueing to maintain motion while talking   gait training with quad tip cane 73ft Trialed in II bars and without- poor ability to coordinate and pt off balance. Not appropriate for patient at this time. (Daughter & pt in agreement)  Gait training with RW 123ft Focus on safe sequential turns; improved safety and speed with RW today  step ups 5x each on 6" step Cueing to look down and readjust foot  placement for safety  Sidestepping in II bars 2x1 min Cueing to look ahead, maintain tal posture   alt posteiror step Looking out the window for visual target; cueing to perform taller step length  mini squat 10x Good form     HOME EXERCISE PROGRAM Last updated: 04/05/23 Access Code: [email or web address] URL: [email or web address] Date: 04/05/2023 Prepared by: [facility] - Outpatient  Rehab - [facility]  Exercises - Sit to Stand with Counter Support  - 1 x daily - 5 x weekly - 2 sets - 10 reps - Standing March with Unilateral Counter Support  - 1 x daily - 5 x weekly - 2 sets - 10 reps - Standing Hip Abduction with Unilateral Counter Support  - 1 x daily - 5 x weekly - 2 sets - 10 reps - Standing Gastroc Stretch at Counter  - 1 x daily - 5 x weekly - 2 sets - 30 sec hold - Seated March  - 1 x daily - 5 x weekly - 2 sets - 10 reps - Seated Long Arc Quad  - 1 x daily - 5 x weekly - 2 sets - 10 reps - Seated Hip Abduction  - 1 x daily - 5 x weekly - 2 sets - 10 reps - Seated Hip Adduction Isometrics with Ball  - 1 x daily - 5 x weekly - 2 sets - 10 reps - 5 sec hold   -------------------------------------- Objective measures below taken at initial eval:  DIAGNOSTIC FINDINGS: none recent  COGNITION: Overall cognitive status:  daughter assists with subjective hx    SENSATION: Daughter reports hx of N/T in B hands from carpal tunnel syndrome  POSTURE: rounded shoulders, forward head, and increased thoracic kyphosis  LOWER EXTREMITY ROM:     Active  Right Eval Left Eval  Hip flexion    Hip extension    Hip abduction    Hip adduction    Hip internal rotation    Hip external rotation    Knee flexion    Knee extension    Ankle dorsiflexion 15 8  Ankle plantarflexion    Ankle inversion    Ankle eversion     (Blank rows = not tested)  LOWER EXTREMITY MMT:    MMT Right Eval Left  Eval  Hip flexion 3 3+  Hip extension    Hip abduction 3+ 3+  Hip adduction 4- 4-   Hip internal rotation    Hip external rotation    Knee flexion 4- 4-  Knee extension 4 4  Ankle dorsiflexion 4 4  Ankle plantarflexion 4 4  Ankle inversion    Ankle eversion    (Blank rows = not tested) *patient with retropulsion in sitting during testing    GAIT: Gait pattern: Shuffling gait with poor foot clearance and trunk flexed over walker  Assistive device utilized: Walker - 2 wheeled Level of assistance: SBA  FUNCTIONAL TESTS:        TODAY'S TREATMENT:                                                                                                                              DATE: 03/27/23    PATIENT EDUCATION: Education details: prognosis, POC, HEP- to be performed at counter for safety Person educated: Patient and Child(ren) Education method: Explanation, Demonstration, Tactile cues, Verbal cues, and Handouts Education comprehension: verbalized understanding and returned demonstration  HOME EXERCISE PROGRAM: Access Code: [email or web address] URL: [email or web address] Date: 03/27/2023 Prepared by: [facility] - Outpatient  Rehab - [facility]  Exercises - Sit to Stand with Counter Support  - 1 x daily - 5 x weekly - 2 sets - 10 reps - Standing March with Unilateral Counter Support  - 1 x daily - 5 x weekly - 2 sets - 10 reps - Standing Hip Abduction with Unilateral Counter Support  - 1 x daily - 5 x weekly - 2 sets - 10 reps - Standing Gastroc Stretch at Counter  - 1 x daily - 5 x weekly - 2 sets - 30 sec hold  GOALS: Goals reviewed with patient? Yes  SHORT TERM GOALS: Target date: 04/24/2023  Patient to be independent with initial HEP. Baseline: HEP initiated Goal status: MET    LONG TERM GOALS: Target date: 05/22/2023  Patient to be independent with advanced HEP. Baseline: Not yet initiated  Goal status: IN PROGRESS  Patient to complete TUG in <30 sec with LRAD in order to decrease risk of falls.   Baseline: 62 sec Goal status: IN  PROGRESS  Patient to demonstrate 5xSTS test in <15 sec in order to decrease risk of falls.  Baseline: 30 sec Goal status: IN PROGRESS  Patient to score at least 46/56 on Berg in order to decrease risk of falls.  Baseline: not completed Goal status: IN PROGRESS  Patient to complete 500 feet during 6 MWT with LRAD. Baseline: 335 feet  Goal status: IN PROGRESS  Patient to demonstrate safe use of RW with gait, turns, transfers. Baseline: unsafe currently Goal status: IN PROGRESS   ASSESSMENT:  CLINICAL IMPRESSION: Patient arrived to session without complaints. Trialed gait training with quad tip cane which was limited by difficulty coordinating and imbalance; ultimately this is not an appropriate AD for pt  at this time. Did demonstrate improved safety and speed with RW today and focused on safe turns today. Some difficulty coordinating reciprocal movements which improved with practice and required cues for upright posture with most activities. Patient tolerated session well and without complaints upon leaving.    OBJECTIVE IMPAIRMENTS: Abnormal gait, decreased activity tolerance, decreased balance, difficulty walking, decreased strength, dizziness, impaired flexibility, and postural dysfunction.   ACTIVITY LIMITATIONS: carrying, lifting, bending, sitting, standing, squatting, stairs, transfers, bathing, toileting, dressing, reach over head, hygiene/grooming, and locomotion level  PARTICIPATION LIMITATIONS: meal prep, cleaning, laundry, driving, shopping, community activity, occupation, and church  PERSONAL FACTORS: Age, Behavior pattern, Past/current experiences, Time since onset of injury/illness/exacerbation, and 3+ comorbidities: COPD, COVID, HLD, HTN, lung CA s/p partial lobectomy, osteoporosis  are also affecting patient's functional outcome.   REHAB POTENTIAL: Good  CLINICAL DECISION MAKING: Evolving/moderate complexity  EVALUATION COMPLEXITY: Moderate  PLAN:  PT FREQUENCY:  2x/week  PT DURATION: 8 weeks  PLANNED INTERVENTIONS: Therapeutic exercises, Therapeutic activity, Neuromuscular re-education, Balance training, Gait training, Patient/Family education, Self Care, Joint mobilization, Stair training, Vestibular training, Canalith repositioning, Aquatic Therapy, Dry Needling, Electrical stimulation, Cryotherapy, Moist heat, Taping, Manual therapy, and Re-evaluation  PLAN FOR NEXT SESSION: Work on stairs, foot clearance with gait, HEP review.  Continue to progress proximal strengthening and balance    Anette Guarneri, PT, DPT 04/16/23 12:34 PM  [facility] at [facility] [address] [place], [address] [place], [place] [address] Phone # [phone] Fax # [phone]

## 2023-04-13 ENCOUNTER — Ambulatory Visit (HOSPITAL_BASED_OUTPATIENT_CLINIC_OR_DEPARTMENT_OTHER): Payer: Medicare Other | Admitting: Cardiology

## 2023-04-16 ENCOUNTER — Encounter: Payer: Self-pay | Admitting: Physical Therapy

## 2023-04-16 ENCOUNTER — Ambulatory Visit: Payer: Medicare Other | Admitting: Physical Therapy

## 2023-04-16 DIAGNOSIS — M549 Dorsalgia, unspecified: Secondary | ICD-10-CM | POA: Diagnosis not present

## 2023-04-16 DIAGNOSIS — R2689 Other abnormalities of gait and mobility: Secondary | ICD-10-CM | POA: Diagnosis not present

## 2023-04-16 DIAGNOSIS — R2681 Unsteadiness on feet: Secondary | ICD-10-CM

## 2023-04-16 DIAGNOSIS — M791 Myalgia, unspecified site: Secondary | ICD-10-CM | POA: Diagnosis not present

## 2023-04-16 DIAGNOSIS — R748 Abnormal levels of other serum enzymes: Secondary | ICD-10-CM | POA: Diagnosis not present

## 2023-04-16 DIAGNOSIS — M1990 Unspecified osteoarthritis, unspecified site: Secondary | ICD-10-CM | POA: Diagnosis not present

## 2023-04-16 DIAGNOSIS — M6281 Muscle weakness (generalized): Secondary | ICD-10-CM

## 2023-04-16 DIAGNOSIS — M25552 Pain in left hip: Secondary | ICD-10-CM | POA: Diagnosis not present

## 2023-04-16 DIAGNOSIS — M2550 Pain in unspecified joint: Secondary | ICD-10-CM | POA: Diagnosis not present

## 2023-04-19 ENCOUNTER — Ambulatory Visit: Payer: Medicare Other

## 2023-04-19 DIAGNOSIS — M6281 Muscle weakness (generalized): Secondary | ICD-10-CM

## 2023-04-19 DIAGNOSIS — R2681 Unsteadiness on feet: Secondary | ICD-10-CM | POA: Diagnosis not present

## 2023-04-19 DIAGNOSIS — R2689 Other abnormalities of gait and mobility: Secondary | ICD-10-CM

## 2023-04-19 NOTE — Therapy (Signed)
OUTPATIENT PHYSICAL THERAPY NEURO TREATMENT   Patient Name: Penny [name] MRN: [id number] DOB:06-Sep-1933, 89 y.o., female Today's Date: 04/19/2023   PCP: Lucky Cowboy, MD  REFERRING PROVIDER: Raynelle Dick, NP  END OF SESSION:  PT End of Session - 04/19/23 1403     Visit Number 8    Number of Visits 17    Date for PT Re-Evaluation 05/22/23    Authorization Type [employer] Medicare    PT Start Time 1403    PT Stop Time 1445    PT Time Calculation (min) 42 min    Equipment Utilized During Treatment Gait belt    Activity Tolerance Patient tolerated treatment well    Behavior During Therapy WFL for tasks assessed/performed                   Past Medical History:  Diagnosis Date   COPD (chronic obstructive pulmonary disease) with emphysema (HCC)    COVID-19 12/09/2019   Hyperlipidemia    Hypertension    Lung cancer, upper lobe (HCC) 12/04/1996   RUL   Osteoporosis    S/P partial lobectomy of lung    [id number]   Thigh shingles 12/04/1996   Thoracic aorta atherosclerosis (HCC)    Vitamin D deficiency    Past Surgical History:  Procedure Laterality Date   CARPAL TUNNEL RELEASE Right    2009   LUNG REMOVAL, PARTIAL Right    RUL 1998   OLECRANON BURSECTOMY Left 03/20/2017   Procedure: EXICISION OLECRANON BURSA LEFT ELBOW;  Surgeon: Cindee Salt, MD;  Location: [facility];  Service: Orthopedics;  Laterality: Left;  with scb block in preop   SHOULDER ADHESION RELEASE Right    2004   Patient Active Problem List   Diagnosis Date Noted   Malignant neoplasm of upper lobe of lung, unspecified laterality (HCC) 01/07/2023   Syncope    Symptomatic sinus bradycardia 09/10/2022   Hypokalemia 09/10/2022   Hypomagnesemia 09/10/2022   CAD (coronary artery disease) 09/10/2022   COVID-19 (11/30/2021) 12/01/2021   B12 deficiency 06/28/2020   Abnormal glucose 10/09/2018   Osteoporosis 12/28/2015   COPD (chronic obstructive pulmonary disease) with  emphysema (HCC) 02/25/2015   Atherosclerosis of aorta (HCC) BY ct SCAN IN 2015 02/25/2015   Essential hypertension    Hyperlipidemia, mixed    Vitamin D deficiency    Personal history of lung cancer 12/04/1996    ONSET DATE: end of 2023  REFERRING DIAG: M60.89 (ICD-10-CM) - Other myositis of multiple sites R26.81 (ICD-10-CM) - Unsteady gait  THERAPY DIAG:  Muscle weakness (generalized)  Unsteadiness on feet  Other abnormalities of gait and mobility  Rationale for Evaluation and Treatment: Rehabilitation  SUBJECTIVE:  SUBJECTIVE STATEMENT: Denies falls.   Pt accompanied by: family member Daughter  PERTINENT HISTORY: COPD, COVID, HLD, HTN, lung CA s/p partial lobectomy, osteoporosis  PAIN:  Are you having pain? No   PRECAUTIONS: Fall and Other: lung CA, osteoporosis   WEIGHT BEARING RESTRICTIONS: No  FALLS: Has patient fallen in last 6 months? Yes. Number of falls a few  LIVING ENVIRONMENT: Lives with: lives with their daughter and grandson Lives in: House/apartment Stairs:  2 steps to enter with handrail; 1 story home Has following equipment at home: Single point cane, Environmental consultant - 2 wheeled, and Tour manager  PLOF: Independent; part-time job as a Science writer  PATIENT GOALS: improve muscle strength   OBJECTIVE:    TODAY'S TREATMENT: 04/19/23 Activity Comments  Standing heel/toe raise 2x10   Modified deadlift 1x10 Use of 4" box and 5# dumbell to simulate functional lift, difficulty with achieveing requisite hip hinge and knee flexion primarily using spinal flexion   Sit-stand 3x5 24" seat 5# goblet, increased retro-LOB with fatigue to improve mobility/transfers  Foot lift-off 2x10 4" box for SLS  Standing on foam -EO x 30 sec -EC 5x10 sec -trunk twists with 5#  LAQ 3x10 3#      TODAY'S TREATMENT: 04/16/23 Activity Comments  Nustep L3 x UEs/LEs Cueing to maintain motion while talking   gait training with quad tip cane 7ft Trialed in II bars and without- poor ability to coordinate and pt off balance. Not appropriate for patient at this time. (Daughter & pt in agreement)  Gait training with RW 156ft Focus on safe sequential turns; improved safety and speed with RW today  step ups 5x each on 6" step Cueing to look down and readjust foot placement for safety  Sidestepping in II bars 2x1 min Cueing to look ahead, maintain tal posture   alt posteiror step Looking out the window for visual target; cueing to perform taller step length  mini squat 10x Good form     HOME EXERCISE PROGRAM Last updated: 04/05/23 Access Code: [email or web address] URL: [email or web address] Date: 04/05/2023 Prepared by: [facility] - Outpatient  Rehab - [facility]  Exercises - Sit to Stand with Counter Support  - 1 x daily - 5 x weekly - 2 sets - 10 reps - Standing March with Unilateral Counter Support  - 1 x daily - 5 x weekly - 2 sets - 10 reps - Standing Hip Abduction with Unilateral Counter Support  - 1 x daily - 5 x weekly - 2 sets - 10 reps - Standing Gastroc Stretch at Counter  - 1 x daily - 5 x weekly - 2 sets - 30 sec hold - Seated March  - 1 x daily - 5 x weekly - 2 sets - 10 reps - Seated Long Arc Quad  - 1 x daily - 5 x weekly - 2 sets - 10 reps - Seated Hip Abduction  - 1 x daily - 5 x weekly - 2 sets - 10 reps - Seated Hip Adduction Isometrics with Ball  - 1 x daily - 5 x weekly - 2 sets - 10 reps - 5 sec hold   -------------------------------------- Objective measures below taken at initial eval:  DIAGNOSTIC FINDINGS: none recent  COGNITION: Overall cognitive status:  daughter assists with subjective hx    SENSATION: Daughter reports hx of N/T in B hands from carpal tunnel syndrome  POSTURE: rounded shoulders, forward head, and increased thoracic  kyphosis  LOWER EXTREMITY ROM:     Active  Right Eval Left Eval  Hip flexion    Hip extension    Hip abduction    Hip adduction    Hip internal rotation    Hip external rotation    Knee flexion    Knee extension    Ankle dorsiflexion 15 8  Ankle plantarflexion    Ankle inversion    Ankle eversion     (Blank rows = not tested)  LOWER EXTREMITY MMT:    MMT Right Eval Left Eval  Hip flexion 3 3+  Hip extension    Hip abduction 3+ 3+  Hip adduction 4- 4-  Hip internal rotation    Hip external rotation    Knee flexion 4- 4-  Knee extension 4 4  Ankle dorsiflexion 4 4  Ankle plantarflexion 4 4  Ankle inversion    Ankle eversion    (Blank rows = not tested) *patient with retropulsion in sitting during testing    GAIT: Gait pattern: Shuffling gait with poor foot clearance and trunk flexed over walker  Assistive device utilized: Walker - 2 wheeled Level of assistance: SBA  FUNCTIONAL TESTS:        TODAY'S TREATMENT:                                                                                                                              DATE: 03/27/23    PATIENT EDUCATION: Education details: prognosis, POC, HEP- to be performed at counter for safety Person educated: Patient and Child(ren) Education method: Explanation, Demonstration, Tactile cues, Verbal cues, and Handouts Education comprehension: verbalized understanding and returned demonstration  HOME EXERCISE PROGRAM: Access Code: [email or web address] URL: [email or web address] Date: 03/27/2023 Prepared by: [facility] - Outpatient  Rehab - [facility]  Exercises - Sit to Stand with Counter Support  - 1 x daily - 5 x weekly - 2 sets - 10 reps - Standing March with Unilateral Counter Support  - 1 x daily - 5 x weekly - 2 sets - 10 reps - Standing Hip Abduction with Unilateral Counter Support  - 1 x daily - 5 x weekly - 2 sets - 10 reps - Standing Gastroc Stretch at Counter  - 1 x daily - 5 x  weekly - 2 sets - 30 sec hold  GOALS: Goals reviewed with patient? Yes  SHORT TERM GOALS: Target date: 04/24/2023  Patient to be independent with initial HEP. Baseline: HEP initiated Goal status: MET    LONG TERM GOALS: Target date: 05/22/2023  Patient to be independent with advanced HEP. Baseline: Not yet initiated  Goal status: IN PROGRESS  Patient to complete TUG in <30 sec with LRAD in order to decrease risk of falls.   Baseline: 62 sec Goal status: IN PROGRESS  Patient to demonstrate 5xSTS test in <15 sec in order to decrease risk of falls.  Baseline: 30 sec Goal status: IN PROGRESS  Patient to score at least 46/56 on Berg in order to decrease risk of  falls.  Baseline: not completed Goal status: IN PROGRESS  Patient to complete 500 feet during 6 MWT with LRAD. Baseline: 335 feet  Goal status: IN PROGRESS  Patient to demonstrate safe use of RW with gait, turns, transfers. Baseline: unsafe currently Goal status: IN PROGRESS   ASSESSMENT:  CLINICAL IMPRESSION: Continued with focus on improving functional strength to improve independence and safety with transfers and improve safety with retrieving items from ground via deadlift/squat-lift.  Cues and environment modifcation to assist in body mechanics but difficulty with achieving hip hinge and primarily using spinal flexion.  Continued focus on balance and stability on compliant surfaces and incorporating body movements to improve righting reaction. Continued sessions to progress POC details   OBJECTIVE IMPAIRMENTS: Abnormal gait, decreased activity tolerance, decreased balance, difficulty walking, decreased strength, dizziness, impaired flexibility, and postural dysfunction.   ACTIVITY LIMITATIONS: carrying, lifting, bending, sitting, standing, squatting, stairs, transfers, bathing, toileting, dressing, reach over head, hygiene/grooming, and locomotion level  PARTICIPATION LIMITATIONS: meal prep, cleaning, laundry,  driving, shopping, community activity, occupation, and church  PERSONAL FACTORS: Age, Behavior pattern, Past/current experiences, Time since onset of injury/illness/exacerbation, and 3+ comorbidities: COPD, COVID, HLD, HTN, lung CA s/p partial lobectomy, osteoporosis  are also affecting patient's functional outcome.   REHAB POTENTIAL: Good  CLINICAL DECISION MAKING: Evolving/moderate complexity  EVALUATION COMPLEXITY: Moderate  PLAN:  PT FREQUENCY: 2x/week  PT DURATION: 8 weeks  PLANNED INTERVENTIONS: Therapeutic exercises, Therapeutic activity, Neuromuscular re-education, Balance training, Gait training, Patient/Family education, Self Care, Joint mobilization, Stair training, Vestibular training, Canalith repositioning, Aquatic Therapy, Dry Needling, Electrical stimulation, Cryotherapy, Moist heat, Taping, Manual therapy, and Re-evaluation  PLAN FOR NEXT SESSION: Work on stairs, foot clearance with gait, HEP review.  Continue to progress proximal strengthening and balance    4:11 PM, 04/19/23 M. Shary Decamp, PT, DPT Physical Therapist- [place] Office Number: [phone]

## 2023-04-24 ENCOUNTER — Ambulatory Visit: Payer: Medicare Other

## 2023-04-24 DIAGNOSIS — M6281 Muscle weakness (generalized): Secondary | ICD-10-CM | POA: Diagnosis not present

## 2023-04-24 DIAGNOSIS — R2689 Other abnormalities of gait and mobility: Secondary | ICD-10-CM | POA: Diagnosis not present

## 2023-04-24 DIAGNOSIS — R2681 Unsteadiness on feet: Secondary | ICD-10-CM

## 2023-04-24 NOTE — Therapy (Signed)
OUTPATIENT PHYSICAL THERAPY NEURO TREATMENT   Patient Name: Penny [name] MRN: [id number] DOB:Jun 07, 1933, 89 y.o., female Today's Date: 04/24/2023   PCP: Lucky Cowboy, MD  REFERRING PROVIDER: Raynelle Dick, NP  END OF SESSION:  PT End of Session - 04/24/23 1454     Visit Number 9    Number of Visits 17    Date for PT Re-Evaluation 05/22/23    Authorization Type [employer] Medicare    Progress Note Due on Visit 10    PT Start Time 1453   late arrival   PT Stop Time 1530    PT Time Calculation (min) 37 min    Equipment Utilized During Treatment Gait belt    Activity Tolerance Patient tolerated treatment well    Behavior During Therapy WFL for tasks assessed/performed                   Past Medical History:  Diagnosis Date   COPD (chronic obstructive pulmonary disease) with emphysema (HCC)    COVID-19 12/09/2019   Hyperlipidemia    Hypertension    Lung cancer, upper lobe (HCC) 12/04/1996   RUL   Osteoporosis    S/P partial lobectomy of lung    [id number]   Thigh shingles 12/04/1996   Thoracic aorta atherosclerosis (HCC)    Vitamin D deficiency    Past Surgical History:  Procedure Laterality Date   CARPAL TUNNEL RELEASE Right    2009   LUNG REMOVAL, PARTIAL Right    RUL 1998   OLECRANON BURSECTOMY Left 03/20/2017   Procedure: EXICISION OLECRANON BURSA LEFT ELBOW;  Surgeon: Cindee Salt, MD;  Location: [facility];  Service: Orthopedics;  Laterality: Left;  with scb block in preop   SHOULDER ADHESION RELEASE Right    2004   Patient Active Problem List   Diagnosis Date Noted   Malignant neoplasm of upper lobe of lung, unspecified laterality (HCC) 01/07/2023   Syncope    Symptomatic sinus bradycardia 09/10/2022   Hypokalemia 09/10/2022   Hypomagnesemia 09/10/2022   CAD (coronary artery disease) 09/10/2022   COVID-19 (11/30/2021) 12/01/2021   B12 deficiency 06/28/2020   Abnormal glucose 10/09/2018   Osteoporosis 12/28/2015   COPD  (chronic obstructive pulmonary disease) with emphysema (HCC) 02/25/2015   Atherosclerosis of aorta (HCC) BY ct SCAN IN 2015 02/25/2015   Essential hypertension    Hyperlipidemia, mixed    Vitamin D deficiency    Personal history of lung cancer 12/04/1996    ONSET DATE: end of 2023  REFERRING DIAG: M60.89 (ICD-10-CM) - Other myositis of multiple sites R26.81 (ICD-10-CM) - Unsteady gait  THERAPY DIAG:  Muscle weakness (generalized)  Unsteadiness on feet  Other abnormalities of gait and mobility  Rationale for Evaluation and Treatment: Rehabilitation  SUBJECTIVE:  SUBJECTIVE STATEMENT: Pt reports falling this AM when she experienced "my knees weakend and I dropped down onto my knees then hands"  Pt accompanied by: family member Daughter  PERTINENT HISTORY: COPD, COVID, HLD, HTN, lung CA s/p partial lobectomy, osteoporosis  PAIN:  Are you having pain? No   PRECAUTIONS: Fall and Other: lung CA, osteoporosis   WEIGHT BEARING RESTRICTIONS: No  FALLS: Has patient fallen in last 6 months? Yes. Number of falls a few  LIVING ENVIRONMENT: Lives with: lives with their daughter and grandson Lives in: House/apartment Stairs:  2 steps to enter with handrail; 1 story home Has following equipment at home: Single point cane, Environmental consultant - 2 wheeled, and Tour manager  PLOF: Independent; part-time job as a Science writer  PATIENT GOALS: improve muscle strength   OBJECTIVE:   TODAY'S TREATMENT: 04/24/23 Activity Comments  [place] Knee rules (R knee) -over 57 yr/old - positive tenderness to patella -no tenderness to fibular head -able to flex knee to 110 deg w/out incr pain -able to bear weight and ambulate with antalgic pattern -(+)effusion to right knee  orthostatics Supine: 130/83 mmHg, 97  bpm Standing x 1 min: 149/84 mmHg, 116 bpm Standing x 3 min: 156/95 mmHg, 120 bpm  Pt education Ice, compression, elevation of right knee. Signs/symptoms of lumbar stenosis and neurogenic claudication vs vascular.  Signs/symptoms of orthostatic                   HOME EXERCISE PROGRAM Last updated: 04/05/23 Access Code: [email or web address] URL: [email or web address] Date: 04/05/2023 Prepared by: [facility] - Outpatient  Rehab - [facility]  Exercises - Sit to Stand with Counter Support  - 1 x daily - 5 x weekly - 2 sets - 10 reps - Standing March with Unilateral Counter Support  - 1 x daily - 5 x weekly - 2 sets - 10 reps - Standing Hip Abduction with Unilateral Counter Support  - 1 x daily - 5 x weekly - 2 sets - 10 reps - Standing Gastroc Stretch at Counter  - 1 x daily - 5 x weekly - 2 sets - 30 sec hold - Seated March  - 1 x daily - 5 x weekly - 2 sets - 10 reps - Seated Long Arc Quad  - 1 x daily - 5 x weekly - 2 sets - 10 reps - Seated Hip Abduction  - 1 x daily - 5 x weekly - 2 sets - 10 reps - Seated Hip Adduction Isometrics with Ball  - 1 x daily - 5 x weekly - 2 sets - 10 reps - 5 sec hold   -------------------------------------- Objective measures below taken at initial eval:  DIAGNOSTIC FINDINGS: none recent  COGNITION: Overall cognitive status:  daughter assists with subjective hx    SENSATION: Daughter reports hx of N/T in B hands from carpal tunnel syndrome  POSTURE: rounded shoulders, forward head, and increased thoracic kyphosis  LOWER EXTREMITY ROM:     Active  Right Eval Left Eval  Hip flexion    Hip extension    Hip abduction    Hip adduction    Hip internal rotation    Hip external rotation    Knee flexion    Knee extension    Ankle dorsiflexion 15 8  Ankle plantarflexion    Ankle inversion    Ankle eversion     (Blank rows = not tested)  LOWER EXTREMITY MMT:    MMT Right Eval Left Eval  Hip flexion 3  3+  Hip extension     Hip abduction 3+ 3+  Hip adduction 4- 4-  Hip internal rotation    Hip external rotation    Knee flexion 4- 4-  Knee extension 4 4  Ankle dorsiflexion 4 4  Ankle plantarflexion 4 4  Ankle inversion    Ankle eversion    (Blank rows = not tested) *patient with retropulsion in sitting during testing    GAIT: Gait pattern: Shuffling gait with poor foot clearance and trunk flexed over walker  Assistive device utilized: Walker - 2 wheeled Level of assistance: SBA  FUNCTIONAL TESTS:        TODAY'S TREATMENT:                                                                                                                              DATE: 03/27/23    PATIENT EDUCATION: Education details: prognosis, POC, HEP- to be performed at counter for safety Person educated: Patient and Child(ren) Education method: Explanation, Demonstration, Tactile cues, Verbal cues, and Handouts Education comprehension: verbalized understanding and returned demonstration  HOME EXERCISE PROGRAM: Access Code: [email or web address] URL: [email or web address] Date: 03/27/2023 Prepared by: [facility] - Outpatient  Rehab - [facility]  Exercises - Sit to Stand with Counter Support  - 1 x daily - 5 x weekly - 2 sets - 10 reps - Standing March with Unilateral Counter Support  - 1 x daily - 5 x weekly - 2 sets - 10 reps - Standing Hip Abduction with Unilateral Counter Support  - 1 x daily - 5 x weekly - 2 sets - 10 reps - Standing Gastroc Stretch at Counter  - 1 x daily - 5 x weekly - 2 sets - 30 sec hold  GOALS: Goals reviewed with patient? Yes  SHORT TERM GOALS: Target date: 04/24/2023  Patient to be independent with initial HEP. Baseline: HEP initiated Goal status: MET    LONG TERM GOALS: Target date: 05/22/2023  Patient to be independent with advanced HEP. Baseline: Not yet initiated  Goal status: IN PROGRESS  Patient to complete TUG in <30 sec with LRAD in order to decrease risk of  falls.   Baseline: 62 sec Goal status: IN PROGRESS  Patient to demonstrate 5xSTS test in <15 sec in order to decrease risk of falls.  Baseline: 30 sec Goal status: IN PROGRESS  Patient to score at least 46/56 on Berg in order to decrease risk of falls.  Baseline: not completed Goal status: IN PROGRESS  Patient to complete 500 feet during 6 MWT with LRAD. Baseline: 335 feet  Goal status: IN PROGRESS  Patient to demonstrate safe use of RW with gait, turns, transfers. Baseline: unsafe currently Goal status: IN PROGRESS   ASSESSMENT:  CLINICAL IMPRESSION: Returns to clinic with report of knees giving way this AM and falling forward to knees then hands.  Inspection reveals right knee effusion peri-patellar and tenderness to palpation.  [place] knee rules reviewed with  two positive indicators and pt/family educated on further assessment and watchful waiting.  Pt reports episodes of lightheadedness/dizziness with position changes, denies any feeling of positional vertigo. Orthostatic hypotension assessed and reveals negative result.  Pt notes with prolonged standing/activity her knees progressively flex and limit standing stability.  Continued sessions to progress as able    OBJECTIVE IMPAIRMENTS: Abnormal gait, decreased activity tolerance, decreased balance, difficulty walking, decreased strength, dizziness, impaired flexibility, and postural dysfunction.   ACTIVITY LIMITATIONS: carrying, lifting, bending, sitting, standing, squatting, stairs, transfers, bathing, toileting, dressing, reach over head, hygiene/grooming, and locomotion level  PARTICIPATION LIMITATIONS: meal prep, cleaning, laundry, driving, shopping, community activity, occupation, and church  PERSONAL FACTORS: Age, Behavior pattern, Past/current experiences, Time since onset of injury/illness/exacerbation, and 3+ comorbidities: COPD, COVID, HLD, HTN, lung CA s/p partial lobectomy, osteoporosis  are also affecting patient's  functional outcome.   REHAB POTENTIAL: Good  CLINICAL DECISION MAKING: Evolving/moderate complexity  EVALUATION COMPLEXITY: Moderate  PLAN:  PT FREQUENCY: 2x/week  PT DURATION: 8 weeks  PLANNED INTERVENTIONS: Therapeutic exercises, Therapeutic activity, Neuromuscular re-education, Balance training, Gait training, Patient/Family education, Self Care, Joint mobilization, Stair training, Vestibular training, Canalith repositioning, Aquatic Therapy, Dry Needling, Electrical stimulation, Cryotherapy, Moist heat, Taping, Manual therapy, and Re-evaluation  PLAN FOR NEXT SESSION: Work on stairs, foot clearance with gait, HEP review.  Continue to progress proximal strengthening and balance    2:54 PM, 04/24/23 M. Shary Decamp, PT, DPT Physical Therapist- [place] Office Number: [phone]

## 2023-04-30 ENCOUNTER — Other Ambulatory Visit: Payer: Self-pay | Admitting: Internal Medicine

## 2023-04-30 DIAGNOSIS — J301 Allergic rhinitis due to pollen: Secondary | ICD-10-CM

## 2023-04-30 DIAGNOSIS — E782 Mixed hyperlipidemia: Secondary | ICD-10-CM

## 2023-04-30 MED ORDER — SIMVASTATIN 40 MG PO TABS: ORAL_TABLET | ORAL | 3 refills | Status: DC

## 2023-04-30 MED ORDER — MONTELUKAST SODIUM 10 MG PO TABS: ORAL_TABLET | ORAL | 3 refills | Status: DC

## 2023-05-01 ENCOUNTER — Ambulatory Visit: Payer: Medicare Other | Admitting: Physical Therapy

## 2023-05-03 ENCOUNTER — Ambulatory Visit: Payer: Medicare Other

## 2023-05-03 DIAGNOSIS — R2689 Other abnormalities of gait and mobility: Secondary | ICD-10-CM | POA: Diagnosis not present

## 2023-05-03 DIAGNOSIS — R2681 Unsteadiness on feet: Secondary | ICD-10-CM

## 2023-05-03 DIAGNOSIS — M6281 Muscle weakness (generalized): Secondary | ICD-10-CM | POA: Diagnosis not present

## 2023-05-03 NOTE — Therapy (Signed)
OUTPATIENT PHYSICAL THERAPY NEURO TREATMENT and Progress Note   Patient Name: Penny [name] MRN: [id number] DOB:May 27, 1933, 89 y.o., female Today's Date: 05/03/2023   PCP: Lucky Cowboy, MD  REFERRING PROVIDER: Raynelle Dick, NP  Progress Note Reporting Period 03/27/23 to 05/03/23  See note below for Objective Data and Assessment of Progress/Goals.      END OF SESSION:  PT End of Session - 05/03/23 1315     Visit Number 10    Number of Visits 17    Date for PT Re-Evaluation 05/22/23    Authorization Type [employer] Medicare    Progress Note Due on Visit 20    PT Start Time 1315    PT Stop Time 1400    PT Time Calculation (min) 45 min    Equipment Utilized During Treatment Gait belt    Activity Tolerance Patient tolerated treatment well    Behavior During Therapy WFL for tasks assessed/performed                   Past Medical History:  Diagnosis Date   COPD (chronic obstructive pulmonary disease) with emphysema (HCC)    COVID-19 12/09/2019   Hyperlipidemia    Hypertension    Lung cancer, upper lobe (HCC) 12/04/1996   RUL   Osteoporosis    S/P partial lobectomy of lung    [id number]   Thigh shingles 12/04/1996   Thoracic aorta atherosclerosis (HCC)    Vitamin D deficiency    Past Surgical History:  Procedure Laterality Date   CARPAL TUNNEL RELEASE Right    2009   LUNG REMOVAL, PARTIAL Right    RUL 1998   OLECRANON BURSECTOMY Left 03/20/2017   Procedure: EXICISION OLECRANON BURSA LEFT ELBOW;  Surgeon: Cindee Salt, MD;  Location: [facility];  Service: Orthopedics;  Laterality: Left;  with scb block in preop   SHOULDER ADHESION RELEASE Right    2004   Patient Active Problem List   Diagnosis Date Noted   Malignant neoplasm of upper lobe of lung, unspecified laterality (HCC) 01/07/2023   Syncope    Symptomatic sinus bradycardia 09/10/2022   Hypokalemia 09/10/2022   Hypomagnesemia 09/10/2022   CAD (coronary artery disease) 09/10/2022    COVID-19 (11/30/2021) 12/01/2021   B12 deficiency 06/28/2020   Abnormal glucose 10/09/2018   Osteoporosis 12/28/2015   COPD (chronic obstructive pulmonary disease) with emphysema (HCC) 02/25/2015   Atherosclerosis of aorta (HCC) BY ct SCAN IN 2015 02/25/2015   Essential hypertension    Hyperlipidemia, mixed    Vitamin D deficiency    Personal history of lung cancer 12/04/1996    ONSET DATE: end of 2023  REFERRING DIAG: M60.89 (ICD-10-CM) - Other myositis of multiple sites R26.81 (ICD-10-CM) - Unsteady gait  THERAPY DIAG:  Muscle weakness (generalized)  Unsteadiness on feet  Other abnormalities of gait and mobility  Rationale for Evaluation and Treatment: Rehabilitation  SUBJECTIVE:  SUBJECTIVE STATEMENT: Knees are sore from fall last week. Bruising still present  Pt accompanied by: family member Daughter  PERTINENT HISTORY: COPD, COVID, HLD, HTN, lung CA s/p partial lobectomy, osteoporosis  PAIN:  Are you having pain? No   PRECAUTIONS: Fall and Other: lung CA, osteoporosis   WEIGHT BEARING RESTRICTIONS: No  FALLS: Has patient fallen in last 6 months? Yes. Number of falls a few  LIVING ENVIRONMENT: Lives with: lives with their daughter and grandson Lives in: House/apartment Stairs:  2 steps to enter with handrail; 1 story home Has following equipment at home: Single point cane, Environmental consultant - 2 wheeled, and Tour manager  PLOF: Independent; part-time job as a Science writer  PATIENT GOALS: improve muscle strength   OBJECTIVE:   TODAY'S TREATMENT: 05/03/23 Activity Comments  Progress Note: STG/LTG performed and reviewed See results below  w/ rollator 255 ft, poor stability and postural control  Gait training -trials with rollator and instruction in brake mgmt -trials with  RW for sharp turns -demo of ski glides instead of tennis balls                     HOME EXERCISE PROGRAM Last updated: 04/05/23 Access Code: [email or web address] URL: [email or web address] Date: 04/05/2023 Prepared by: [facility] - Outpatient  Rehab - [facility]  Exercises - Sit to Stand with Counter Support  - 1 x daily - 5 x weekly - 2 sets - 10 reps - Standing March with Unilateral Counter Support  - 1 x daily - 5 x weekly - 2 sets - 10 reps - Standing Hip Abduction with Unilateral Counter Support  - 1 x daily - 5 x weekly - 2 sets - 10 reps - Standing Gastroc Stretch at Counter  - 1 x daily - 5 x weekly - 2 sets - 30 sec hold - Seated March  - 1 x daily - 5 x weekly - 2 sets - 10 reps - Seated Long Arc Quad  - 1 x daily - 5 x weekly - 2 sets - 10 reps - Seated Hip Abduction  - 1 x daily - 5 x weekly - 2 sets - 10 reps - Seated Hip Adduction Isometrics with Ball  - 1 x daily - 5 x weekly - 2 sets - 10 reps - 5 sec hold   -------------------------------------- Objective measures below taken at initial eval:  DIAGNOSTIC FINDINGS: none recent  COGNITION: Overall cognitive status:  daughter assists with subjective hx    SENSATION: Daughter reports hx of N/T in B hands from carpal tunnel syndrome  POSTURE: rounded shoulders, forward head, and increased thoracic kyphosis  LOWER EXTREMITY ROM:     Active  Right Eval Left Eval  Hip flexion    Hip extension    Hip abduction    Hip adduction    Hip internal rotation    Hip external rotation    Knee flexion    Knee extension    Ankle dorsiflexion 15 8  Ankle plantarflexion    Ankle inversion    Ankle eversion     (Blank rows = not tested)  LOWER EXTREMITY MMT:    MMT Right Eval Left Eval  Hip flexion 3 3+  Hip extension    Hip abduction 3+ 3+  Hip adduction 4- 4-  Hip internal rotation    Hip external rotation    Knee flexion 4- 4-  Knee extension 4 4  Ankle dorsiflexion 4 4  Ankle  plantarflexion 4 4  Ankle inversion    Ankle eversion    (Blank rows = not tested) *patient with retropulsion in sitting during testing    GAIT: Gait pattern: Shuffling gait with poor foot clearance and trunk flexed over walker  Assistive device utilized: Walker - 2 wheeled Level of assistance: SBA  FUNCTIONAL TESTS:        TODAY'S TREATMENT:                                                                                                                              DATE: 03/27/23    PATIENT EDUCATION: Education details: prognosis, POC, HEP- to be performed at counter for safety Person educated: Patient and Child(ren) Education method: Explanation, Demonstration, Tactile cues, Verbal cues, and Handouts Education comprehension: verbalized understanding and returned demonstration  HOME EXERCISE PROGRAM: Access Code: [email or web address] URL: [email or web address] Date: 03/27/2023 Prepared by: [facility] - Outpatient  Rehab - [facility]  Exercises - Sit to Stand with Counter Support  - 1 x daily - 5 x weekly - 2 sets - 10 reps - Standing March with Unilateral Counter Support  - 1 x daily - 5 x weekly - 2 sets - 10 reps - Standing Hip Abduction with Unilateral Counter Support  - 1 x daily - 5 x weekly - 2 sets - 10 reps - Standing Gastroc Stretch at Counter  - 1 x daily - 5 x weekly - 2 sets - 30 sec hold  GOALS: Goals reviewed with patient? Yes  SHORT TERM GOALS: Target date: 04/24/2023  Patient to be independent with initial HEP. Baseline: HEP initiated Goal status: MET    LONG TERM GOALS: Target date: 05/22/2023  Patient to be independent with advanced HEP. Baseline: Not yet initiated  Goal status: IN PROGRESS  Patient to complete TUG in <30 sec with LRAD in order to decrease risk of falls.   Baseline: 62 sec; (05/03/23) 34 sec Goal status: IN PROGRESS  Patient to demonstrate 5xSTS test in <15 sec in order to decrease risk of falls.  Baseline: 30 sec;  (05/03/23) 20 sec Goal status: IN PROGRESS  Patient to score at least 46/56 on Berg in order to decrease risk of falls.  Baseline: (05/03/23) 42/56 Goal status: IN PROGRESS  Patient to complete 500 feet during 6 MWT with LRAD. Baseline: 335 feet  Goal status: IN PROGRESS  Patient to demonstrate safe use of RW with gait, turns, transfers. Baseline: unsafe currently Goal status: IN PROGRESS   ASSESSMENT:  CLINICAL IMPRESSION: Demonstrating marked improvements in functional mobility with improvements in outcome measures but still indicating high risk for falls.  Attempted trial of with use of rollator but pt demo excessive trunk flexion and decreased stability as a result with anterior LOB.  Patient would benefit from continued sessions to meet remaining goals and hopefully progress to low risk for falls   OBJECTIVE IMPAIRMENTS: Abnormal gait, decreased activity tolerance, decreased balance, difficulty walking, decreased strength, dizziness, impaired flexibility,  and postural dysfunction.   ACTIVITY LIMITATIONS: carrying, lifting, bending, sitting, standing, squatting, stairs, transfers, bathing, toileting, dressing, reach over head, hygiene/grooming, and locomotion level  PARTICIPATION LIMITATIONS: meal prep, cleaning, laundry, driving, shopping, community activity, occupation, and church  PERSONAL FACTORS: Age, Behavior pattern, Past/current experiences, Time since onset of injury/illness/exacerbation, and 3+ comorbidities: COPD, COVID, HLD, HTN, lung CA s/p partial lobectomy, osteoporosis  are also affecting patient's functional outcome.   REHAB POTENTIAL: Good  CLINICAL DECISION MAKING: Evolving/moderate complexity  EVALUATION COMPLEXITY: Moderate  PLAN:  PT FREQUENCY: 2x/week  PT DURATION: 8 weeks  PLANNED INTERVENTIONS: Therapeutic exercises, Therapeutic activity, Neuromuscular re-education, Balance training, Gait training, Patient/Family education, Self Care, Joint  mobilization, Stair training, Vestibular training, Canalith repositioning, Aquatic Therapy, Dry Needling, Electrical stimulation, Cryotherapy, Moist heat, Taping, Manual therapy, and Re-evaluation  PLAN FOR NEXT SESSION: Work on stairs, foot clearance with gait, HEP review.  Continue to progress proximal strengthening and balance    1:15 PM, 05/03/23 M. Shary Decamp, PT, DPT Physical Therapist- [place] Office Number: [phone]

## 2023-05-07 NOTE — Therapy (Signed)
OUTPATIENT PHYSICAL THERAPY NEURO TREATMENT   Patient Name: Penny [name] MRN: [id number] DOB:[birth year], 89 y.o., female Today's Date: 05/08/2023   PCP: Lucky Cowboy, MD  REFERRING PROVIDER: Raynelle Dick, NP     END OF SESSION:  PT End of Session - 05/08/23 1614     Visit Number 11    Number of Visits 17    Date for PT Re-Evaluation 05/22/23    Authorization Type [employer] Medicare    Progress Note Due on Visit 20    PT Start Time 1531    PT Stop Time 1614    PT Time Calculation (min) 43 min    Equipment Utilized During Treatment Gait belt    Activity Tolerance Patient tolerated treatment well    Behavior During Therapy WFL for tasks assessed/performed                    Past Medical History:  Diagnosis Date   COPD (chronic obstructive pulmonary disease) with emphysema (HCC)    COVID-19 12/09/2019   Hyperlipidemia    Hypertension    Lung cancer, upper lobe (HCC) 12/04/1996   RUL   Osteoporosis    S/P partial lobectomy of lung    RUL1998   Thigh shingles 12/04/1996   Thoracic aorta atherosclerosis (HCC)    Vitamin D deficiency    Past Surgical History:  Procedure Laterality Date   CARPAL TUNNEL RELEASE Right    2009   LUNG REMOVAL, PARTIAL Right    RUL 1998   OLECRANON BURSECTOMY Left 03/20/2017   Procedure: EXICISION OLECRANON BURSA LEFT ELBOW;  Surgeon: Cindee Salt, MD;  Location: [facility];  Service: Orthopedics;  Laterality: Left;  with scb block in preop   SHOULDER ADHESION RELEASE Right    2004   Patient Active Problem List   Diagnosis Date Noted   Malignant neoplasm of upper lobe of lung, unspecified laterality (HCC) 01/07/2023   Syncope    Symptomatic sinus bradycardia 09/10/2022   Hypokalemia 09/10/2022   Hypomagnesemia 09/10/2022   CAD (coronary artery disease) 09/10/2022   COVID-19 (11/30/2021) 12/01/2021   B12 deficiency 06/28/2020   Abnormal glucose 10/09/2018   Osteoporosis 12/28/2015   COPD (chronic  obstructive pulmonary disease) with emphysema (HCC) 02/25/2015   Atherosclerosis of aorta (HCC) BY ct SCAN IN 2015 02/25/2015   Essential hypertension    Hyperlipidemia, mixed    Vitamin D deficiency    Personal history of lung cancer 12/04/1996    ONSET DATE: end of 2023  REFERRING DIAG: M60.89 (ICD-10-CM) - Other myositis of multiple sites R26.81 (ICD-10-CM) - Unsteady gait  THERAPY DIAG:  Muscle weakness (generalized)  Unsteadiness on feet  Other abnormalities of gait and mobility  Rationale for Evaluation and Treatment: Rehabilitation  SUBJECTIVE:  SUBJECTIVE STATEMENT: Daughter reports a fall in the living room, sitting into coffee table. Denies head trauma. Daughter reports that she feels that patient has forgotten how to walk. Denies recent infection.   Pt accompanied by: family member Daughter  PERTINENT HISTORY: COPD, COVID, HLD, HTN, lung CA s/p partial lobectomy, osteoporosis  PAIN:  Are you having pain? No   PRECAUTIONS: Fall and Other: lung CA, osteoporosis   WEIGHT BEARING RESTRICTIONS: No  FALLS: Has patient fallen in last 6 months? Yes. Number of falls a few  LIVING ENVIRONMENT: Lives with: lives with their daughter and grandson Lives in: House/apartment Stairs:  2 steps to enter with handrail; 1 story home Has following equipment at home: Single point cane, Environmental consultant - 2 wheeled, and Tour manager  PLOF: Independent; part-time job as a Science writer  PATIENT GOALS: improve muscle strength   OBJECTIVE:     TODAY'S TREATMENT: 05/08/23 Activity Comments  Nustep L2 x 10 min UE/LEs  slow speed   Standing alt forward steps to colored dot, then stepping over 1/2 foam roll Cueing for larger steps, then taller steps to avoid catching foot on obstacles   Gait training with  RW, working on turns  x273ft 1 episode of LOB with min-mod A to recover, long steps with tall posture, encouraging continuous forward momentum, avoiding getting too close to walker to avoid posterior LOB              PATIENT EDUCATION: Education details: discussed decline in mobility and cognition with daughter- advised to reach out to PCP and possible have workup with neurology if warranted  Person educated: Patient and Child(ren) Education method: Explanation Education comprehension: verbalized understanding    HOME EXERCISE PROGRAM Last updated: 04/05/23 Access Code: [email or web address] URL: [email or web address] Date: 04/05/2023 Prepared by: [facility] - Outpatient  Rehab - [facility]  Exercises - Sit to Stand with Counter Support  - 1 x daily - 5 x weekly - 2 sets - 10 reps - Standing March with Unilateral Counter Support  - 1 x daily - 5 x weekly - 2 sets - 10 reps - Standing Hip Abduction with Unilateral Counter Support  - 1 x daily - 5 x weekly - 2 sets - 10 reps - Standing Gastroc Stretch at Counter  - 1 x daily - 5 x weekly - 2 sets - 30 sec hold - Seated March  - 1 x daily - 5 x weekly - 2 sets - 10 reps - Seated Long Arc Quad  - 1 x daily - 5 x weekly - 2 sets - 10 reps - Seated Hip Abduction  - 1 x daily - 5 x weekly - 2 sets - 10 reps - Seated Hip Adduction Isometrics with Ball  - 1 x daily - 5 x weekly - 2 sets - 10 reps - 5 sec hold   -------------------------------------- Objective measures below taken at initial eval:  DIAGNOSTIC FINDINGS: none recent  COGNITION: Overall cognitive status:  daughter assists with subjective hx    SENSATION: Daughter reports hx of N/T in B hands from carpal tunnel syndrome  POSTURE: rounded shoulders, forward head, and increased thoracic kyphosis  LOWER EXTREMITY ROM:     Active  Right Eval Left Eval  Hip flexion    Hip extension    Hip abduction    Hip adduction    Hip internal rotation    Hip external  rotation    Knee flexion    Knee extension    Ankle  dorsiflexion 15 8  Ankle plantarflexion    Ankle inversion    Ankle eversion     (Blank rows = not tested)  LOWER EXTREMITY MMT:    MMT Right Eval Left Eval  Hip flexion 3 3+  Hip extension    Hip abduction 3+ 3+  Hip adduction 4- 4-  Hip internal rotation    Hip external rotation    Knee flexion 4- 4-  Knee extension 4 4  Ankle dorsiflexion 4 4  Ankle plantarflexion 4 4  Ankle inversion    Ankle eversion    (Blank rows = not tested) *patient with retropulsion in sitting during testing    GAIT: Gait pattern: Shuffling gait with poor foot clearance and trunk flexed over walker  Assistive device utilized: Walker - 2 wheeled Level of assistance: SBA  FUNCTIONAL TESTS:        TODAY'S TREATMENT:                                                                                                                              DATE: 03/27/23    PATIENT EDUCATION: Education details: prognosis, POC, HEP- to be performed at counter for safety Person educated: Patient and Child(ren) Education method: Explanation, Demonstration, Tactile cues, Verbal cues, and Handouts Education comprehension: verbalized understanding and returned demonstration  HOME EXERCISE PROGRAM: Access Code: [email or web address] URL: [email or web address] Date: 03/27/2023 Prepared by: [facility] - Outpatient  Rehab - [facility]  Exercises - Sit to Stand with Counter Support  - 1 x daily - 5 x weekly - 2 sets - 10 reps - Standing March with Unilateral Counter Support  - 1 x daily - 5 x weekly - 2 sets - 10 reps - Standing Hip Abduction with Unilateral Counter Support  - 1 x daily - 5 x weekly - 2 sets - 10 reps - Standing Gastroc Stretch at Counter  - 1 x daily - 5 x weekly - 2 sets - 30 sec hold  GOALS: Goals reviewed with patient? Yes  SHORT TERM GOALS: Target date: 04/24/2023  Patient to be independent with initial HEP. Baseline: HEP  initiated Goal status: MET    LONG TERM GOALS: Target date: 05/22/2023  Patient to be independent with advanced HEP. Baseline: Not yet initiated  Goal status: IN PROGRESS  Patient to complete TUG in <30 sec with LRAD in order to decrease risk of falls.   Baseline: 62 sec; (05/03/23) 34 sec Goal status: IN PROGRESS  Patient to demonstrate 5xSTS test in <15 sec in order to decrease risk of falls.  Baseline: 30 sec; (05/03/23) 20 sec Goal status: IN PROGRESS  Patient to score at least 46/56 on Berg in order to decrease risk of falls.  Baseline: (05/03/23) 42/56 Goal status: IN PROGRESS  Patient to complete 500 feet during 6 MWT with LRAD. Baseline: 335 feet  Goal status: IN PROGRESS  Patient to demonstrate safe use of RW with gait, turns, transfers. Baseline: unsafe  currently Goal status: IN PROGRESS   ASSESSMENT:  CLINICAL IMPRESSION: Patient arrived to session with significant difficulty walking and shuffling gait with RW positioned too far forward. Discussed patient's concerns about limited/declining mobility and cognition and recommended to f/u with PCP about this. Worked on pre-gait and gait training activities with focus on safe navigation/placement of walker and large steps. Patient able to demonstrate improved gait pattern compared to when coming into session. No complaints upon leaving.    OBJECTIVE IMPAIRMENTS: Abnormal gait, decreased activity tolerance, decreased balance, difficulty walking, decreased strength, dizziness, impaired flexibility, and postural dysfunction.   ACTIVITY LIMITATIONS: carrying, lifting, bending, sitting, standing, squatting, stairs, transfers, bathing, toileting, dressing, reach over head, hygiene/grooming, and locomotion level  PARTICIPATION LIMITATIONS: meal prep, cleaning, laundry, driving, shopping, community activity, occupation, and church  PERSONAL FACTORS: Age, Behavior pattern, Past/current experiences, Time since onset of  injury/illness/exacerbation, and 3+ comorbidities: COPD, COVID, HLD, HTN, lung CA s/p partial lobectomy, osteoporosis  are also affecting patient's functional outcome.   REHAB POTENTIAL: Good  CLINICAL DECISION MAKING: Evolving/moderate complexity  EVALUATION COMPLEXITY: Moderate  PLAN:  PT FREQUENCY: 2x/week  PT DURATION: 8 weeks  PLANNED INTERVENTIONS: Therapeutic exercises, Therapeutic activity, Neuromuscular re-education, Balance training, Gait training, Patient/Family education, Self Care, Joint mobilization, Stair training, Vestibular training, Canalith repositioning, Aquatic Therapy, Dry Needling, Electrical stimulation, Cryotherapy, Moist heat, Taping, Manual therapy, and Re-evaluation  PLAN FOR NEXT SESSION: Work on stairs, foot clearance with gait, HEP review.  Continue to progress proximal strengthening and balance  Anette Guarneri, PT, DPT 05/08/23 4:17 PM  [facility]patient Rehab at [facility] [address] [place], [address] [place], [place] [address] Phone # [phone] Fax # [phone]

## 2023-05-08 ENCOUNTER — Encounter: Payer: Self-pay | Admitting: Physical Therapy

## 2023-05-08 ENCOUNTER — Ambulatory Visit: Payer: Medicare Other | Attending: Nurse Practitioner | Admitting: Physical Therapy

## 2023-05-08 ENCOUNTER — Other Ambulatory Visit: Payer: Self-pay

## 2023-05-08 DIAGNOSIS — E782 Mixed hyperlipidemia: Secondary | ICD-10-CM

## 2023-05-08 DIAGNOSIS — M6281 Muscle weakness (generalized): Secondary | ICD-10-CM | POA: Diagnosis not present

## 2023-05-08 DIAGNOSIS — R2689 Other abnormalities of gait and mobility: Secondary | ICD-10-CM | POA: Insufficient documentation

## 2023-05-08 DIAGNOSIS — J301 Allergic rhinitis due to pollen: Secondary | ICD-10-CM

## 2023-05-08 DIAGNOSIS — R2681 Unsteadiness on feet: Secondary | ICD-10-CM | POA: Insufficient documentation

## 2023-05-08 MED ORDER — SIMVASTATIN 40 MG PO TABS: ORAL_TABLET | ORAL | 3 refills | Status: DC

## 2023-05-08 MED ORDER — MONTELUKAST SODIUM 10 MG PO TABS: ORAL_TABLET | ORAL | 3 refills | Status: DC

## 2023-05-10 ENCOUNTER — Ambulatory Visit: Payer: Medicare Other | Admitting: Physical Therapy

## 2023-05-11 NOTE — Progress Notes (Unsigned)
Encounter for Medicare Annual Wellness and Follow Up  Assessment:   Encounter for medicare annual wellness exam with abnormal findings Due yearly  Essential hypertension - continue medications:HCTZ 25mg , metoprolol 50mg  BID - DASH diet, exercise and monitor at home. Call if greater than 130/80.  - CBC with Differential/Platelet - TSH   Hyperlipidemia -continue simvastatin 40mg and could not tolerate crestor  -check lipids, decrease fatty foods, increase activity.  - Lipid panel  Abnormal glucose Discussed general issues about diabetes pathophysiology and management., Educational material distributed., Suggested low cholesterol diet., Encouraged aerobic exercise., Discussed foot care., Reminded to get yearly retinal exam. - Hemoglobin A1c   Vitamin D deficiency - Vit D  25 hydroxy (rtn osteoporosis monitoring)   Medication management - Magnesium  Atherosclerosis of aorta Control blood pressure, cholesterol, glucose, increase exercise.   Hx of cancer of lung/Pulmonary emphysema CXR 02/07/21, continue follow up- CXR ordered  Osteopenia after menopause Continue vitamin D Did not tolerate fosamax  Environmental allergies Allergic rhinitis- Allegra OTC, increase fluids, allergy hygiene explained.  Iron deficiency Check Iron total/TIBC, Ferritin  B12 deficiency - Check B-12   Medication management -     Magnesium     Further disposition pending results if labs check today. Discussed med's effects and SE's.   Over 30 minutes of face to face interview, exam, counseling, chart review, and critical decision making was performed.    Future Appointments  Date Time Provider Department Center  05/14/2023  3:30 PM Raynelle Dick, NP [facility] None  05/16/2023 12:30 PM Kerry Fort, PT [facility]  05/18/2023 11:00 AM Gean Maidens, PT [facility]  05/22/2023  1:15 PM Gean Maidens, PT [facility]  07/02/2023 11:00 AM Jodelle Red, MD [facility]   09/04/2023 10:00 AM Raynelle Dick, NP [facility] None     Subjective:   Penny [name] is a 89 y.o. female who presents for complete physical on hypertension, prediabetes, hyperlipidemia, vitamin D def.    She continues to work at [employer] in [employer] in [employer],  6 hours a day.  BMI is There is no height or weight on file to calculate BMI., she has been working on diet and exercise.  Wt Readings from Last 3 Encounters:  03/21/23 142 lb 12.8 oz (64.8 kg)  03/14/23 144 lb (65.3 kg)  02/19/23 142 lb 3.2 oz (64.5 kg)   Her blood pressure has been controlled at home, today their BP is    BP Readings from Last 3 Encounters:  03/21/23 (!) 142/78  03/14/23 134/88  02/19/23 110/70    She does not workout but is very active and does try to walk.  She denies chest pain, shortness of breath, dizziness.   She is on cholesterol medication, simvastatin 40 and denies myalgias. Her cholesterol is at goal. The cholesterol last visit was:   Lab Results  Component Value Date   CHOL 184 01/03/2023   HDL 72 01/03/2023   LDLCALC 94 01/03/2023   TRIG 88 01/03/2023   CHOLHDL 2.6 01/03/2023   She has been working on diet and exercise for prediabetes, and denies paresthesia of the feet, polydipsia and polyuria. Last A1C in the office was:  Lab Results  Component Value Date   HGBA1C 6.0 (H) 01/03/2023   Patient is on Vitamin D supplement. Discussed decreasing dose, 5,000IU Lab Results  Component Value Date   VD25OH 71 01/03/2023     She believes her memory has improved on focus supplement. No numbness/tingling  in hands or feet. B12 elevated, discussed reduced dose for this.  Lab Results  Component Value Date   VITAMINB12 >2,000 (H) 02/07/2022    Medication Review Current Outpatient Medications on File Prior to Visit  Medication Sig Dispense Refill   aspirin 81 MG chewable tablet Chew 81 mg by mouth every evening.     Budeson-Glycopyrrol-Formoterol (BREZTRI  AEROSPHERE) 160-9-4.8 MCG/ACT AERO Inhale 1 puff into the lungs in the morning and at bedtime. 10.7 g 3   cetirizine (ZYRTEC) 10 MG tablet Takes 1 tablet Daily (Patient taking differently: Take 10 mg by mouth every evening.)     Cholecalciferol 125 MCG (5000 UT) capsule Take 1 capsules Daily (Patient not taking: Reported on 03/21/2023) 90 capsule 99   Cyanocobalamin (VITAMIN B-12) 1000 MCG SUBL Takes 1 tab SL Daily (Patient not taking: Reported on 03/21/2023)  0   montelukast (SINGULAIR) 10 MG tablet Take  1 tablet  Daily for Allergies                                                            /                            TAKE                      BY                     MOUTH 90 tablet 3   simvastatin (ZOCOR) 40 MG tablet Take  1 tablet  every Night for Cholesterol                                                         /                                                TAKE                                                    BY                                                      MOUTH 90 tablet 3   No current facility-administered medications on file prior to visit.    Current Problems (verified) Patient Active Problem List   Diagnosis Date Noted   Malignant neoplasm of upper lobe of lung, unspecified laterality (HCC) 01/07/2023   Syncope    Symptomatic sinus bradycardia 09/10/2022   Hypokalemia 09/10/2022   Hypomagnesemia 09/10/2022   CAD (coronary artery disease) 09/10/2022   COVID-19 (11/30/2021) 12/01/2021   B12 deficiency 06/28/2020  Abnormal glucose 10/09/2018   Osteoporosis 12/28/2015   COPD (chronic obstructive pulmonary disease) with emphysema (HCC) 02/25/2015   Atherosclerosis of aorta (HCC) BY ct SCAN IN 2015 02/25/2015   Essential hypertension    Hyperlipidemia, mixed    Vitamin D deficiency    Personal history of lung cancer 12/04/1996    Immunization History  Administered Date(s) Administered   Influenza, High Dose Seasonal PF 09/15/2015, 10/02/2017, 07/27/2019,  10/07/2020, 09/19/2021, 09/20/2022   Influenza-Unspecified 08/04/2012, 09/03/2013, 09/03/2018   PFIZER(Purple Top)SARS-COV-2 Vaccination 01/26/2020, 02/16/2020   Pneumococcal Conjugate-13 07/21/2014   Pneumococcal Polysaccharide-23 10/30/2005   Td 04/03/2013   Preventative care: Last colonoscopy: 2012 done  Last mammogram: 07/25/21 Last pap smear/pelvic exam: 2012 negative DEXA: 07/25/21 has been on alendronate since 2016- stopped 2019- -2.2 osteopenia Echo 2009 normal EF CXR 2021 CT cervical spin 10/2014 CT head 10/20/2014  Names of Other Physician/Practitioners you currently use: 1.  [facility]- here for primary care 2. Dr. Randon Goldsmith, eye doctor, last visit 2020 3. , dentist, last visit 2020 4. Dr. Terri Piedra, derm, 2019, goes annually  Patient Care Team: Lucky Cowboy, MD as PCP - General (Internal Medicine) Jodelle Red, MD as PCP - Cardiology (Cardiology) Cherlyn Roberts, MD as Consulting Physician (Dermatology) Cindee Salt, MD as Consulting Physician (Orthopedic Surgery) Sharrell Ku, MD as Consulting Physician (Gastroenterology) [name], MD as Attending Physician (Thoracic Surgery)  Allergies Allergies  Allergen Reactions   Ace Inhibitors Cough   Calcium-Containing Compounds Other (See Comments)    constpation   Lactose Intolerance (Gi) Diarrhea   Polysporin [Bacitracin-Polymyxin B] Rash    Polysporin or neosporin gave her a rash, couldn't remember which    SURGICAL HISTORY She  has a past surgical history that includes Carpal tunnel release (Right); Shoulder adhesion release (Right); Lung removal, partial (Right); and Olecranon bursectomy (Left, 03/20/2017). FAMILY HISTORY Her family history includes Breast cancer in her paternal aunt; Heart disease in her mother; Lung cancer in her father. SOCIAL HISTORY She  reports that she quit smoking about 26 years ago. Her smoking use included cigarettes. She has  never used smokeless tobacco. She reports current alcohol use of about 1.0 standard drink of alcohol per week. She reports that she does not use drugs.  Review of Systems  Constitutional:  Negative for chills and fever.  HENT:  Negative for congestion, hearing loss, sinus pain, sore throat and tinnitus.   Eyes:  Negative for blurred vision and double vision.  Respiratory:  Negative for cough, hemoptysis, sputum production, shortness of breath and wheezing.   Cardiovascular:  Negative for chest pain, palpitations and leg swelling.  Gastrointestinal:  Negative for abdominal pain, constipation, diarrhea, heartburn, nausea and vomiting.  Genitourinary:  Negative for dysuria and urgency.  Musculoskeletal:  Positive for back pain (lower back). Negative for falls, joint pain, myalgias and neck pain.  Skin:  Negative for rash.  Neurological:  Negative for dizziness, tingling, tremors, weakness and headaches.  Endo/Heme/Allergies:  Does not bruise/bleed easily.  Psychiatric/Behavioral:  Negative for depression and suicidal ideas. The patient is not nervous/anxious and does not have insomnia.      Objective:   There were no vitals taken for this visit. There is no height or weight on file to calculate BMI.  General appearance: alert, no distress, WD/WN,  female HEENT: normocephalic, sclerae anicteric, TMs pearly, nares patent, no discharge or erythema, pharynx normal Oral cavity: MMM, no lesions Neck: supple, no lymphadenopathy, no thyromegaly, no masses Skin: warm, dry, no masses, rashes  or lesions noted Heart: RRR, normal S1, S2, no murmurs Lungs: CTA bilaterally, no wheezes, rhonchi, or rales Abdomen: +bs, soft, non tender, non distended, no masses, no hepatomegaly, no splenomegaly Musculoskeletal:  no swelling, no obvious deformity.  No erythema or ecchymosis noted. Extremities: no edema, no cyanosis, no clubbing Pulses: 2+ symmetric, upper and lower extremities, normal cap  refill Neurological: alert, oriented x 3, CN2-12 intact, strength normal upper extremities and lower extremities, sensation normal throughout, DTRs 2+ throughout, no cerebellar signs, gait normal Psychiatric: normal affect, behavior normal, pleasant  Breast: defer Gyn: defer Rectal: defer EKG: NSR, no ST changes  [facility]  05/11/2023

## 2023-05-14 ENCOUNTER — Encounter: Payer: Self-pay | Admitting: Nurse Practitioner

## 2023-05-14 ENCOUNTER — Ambulatory Visit (INDEPENDENT_AMBULATORY_CARE_PROVIDER_SITE_OTHER): Payer: Medicare Other | Admitting: Nurse Practitioner

## 2023-05-14 VITALS — BP 138/86 | HR 95 | Temp 98.1°F | Ht 62.0 in | Wt 141.2 lb

## 2023-05-14 DIAGNOSIS — E559 Vitamin D deficiency, unspecified: Secondary | ICD-10-CM | POA: Diagnosis not present

## 2023-05-14 DIAGNOSIS — R531 Weakness: Secondary | ICD-10-CM | POA: Diagnosis not present

## 2023-05-14 DIAGNOSIS — R350 Frequency of micturition: Secondary | ICD-10-CM | POA: Diagnosis not present

## 2023-05-14 DIAGNOSIS — M810 Age-related osteoporosis without current pathological fracture: Secondary | ICD-10-CM

## 2023-05-14 DIAGNOSIS — Z0000 Encounter for general adult medical examination without abnormal findings: Secondary | ICD-10-CM

## 2023-05-14 DIAGNOSIS — Z0001 Encounter for general adult medical examination with abnormal findings: Secondary | ICD-10-CM

## 2023-05-14 DIAGNOSIS — C3410 Malignant neoplasm of upper lobe, unspecified bronchus or lung: Secondary | ICD-10-CM | POA: Diagnosis not present

## 2023-05-14 DIAGNOSIS — R2681 Unsteadiness on feet: Secondary | ICD-10-CM

## 2023-05-14 DIAGNOSIS — R6889 Other general symptoms and signs: Secondary | ICD-10-CM

## 2023-05-14 DIAGNOSIS — F321 Major depressive disorder, single episode, moderate: Secondary | ICD-10-CM

## 2023-05-14 DIAGNOSIS — I700 Atherosclerosis of aorta: Secondary | ICD-10-CM

## 2023-05-14 DIAGNOSIS — R7309 Other abnormal glucose: Secondary | ICD-10-CM | POA: Diagnosis not present

## 2023-05-14 DIAGNOSIS — E538 Deficiency of other specified B group vitamins: Secondary | ICD-10-CM

## 2023-05-14 DIAGNOSIS — I10 Essential (primary) hypertension: Secondary | ICD-10-CM

## 2023-05-14 DIAGNOSIS — J439 Emphysema, unspecified: Secondary | ICD-10-CM

## 2023-05-14 DIAGNOSIS — D509 Iron deficiency anemia, unspecified: Secondary | ICD-10-CM

## 2023-05-14 DIAGNOSIS — Z9109 Other allergy status, other than to drugs and biological substances: Secondary | ICD-10-CM | POA: Diagnosis not present

## 2023-05-14 DIAGNOSIS — E782 Mixed hyperlipidemia: Secondary | ICD-10-CM

## 2023-05-14 DIAGNOSIS — G729 Myopathy, unspecified: Secondary | ICD-10-CM

## 2023-05-14 DIAGNOSIS — Z79899 Other long term (current) drug therapy: Secondary | ICD-10-CM | POA: Diagnosis not present

## 2023-05-14 MED ORDER — BUPROPION HCL 75 MG PO TABS: ORAL_TABLET | ORAL | 0 refills | Status: DC

## 2023-05-14 NOTE — Addendum Note (Signed)
Addended by: Raynelle Dick on: 05/14/2023 04:38 PM   Modules accepted: Orders

## 2023-05-14 NOTE — Patient Instructions (Addendum)
Managing Depression, Adult Depression is a mental health condition that affects your thoughts, feelings, and actions. Being diagnosed with depression can bring you relief if you did not know why you have felt or behaved a certain way. It could also leave you feeling overwhelmed. Finding ways to manage your symptoms can help you feel more positive about your future. How to manage lifestyle changes Being depressed is difficult. Depression can increase the level of everyday stress. Stress can make depression symptoms worse. You may believe your symptoms cannot be managed or will never improve. However, there are many things you can try to help manage your symptoms. There is hope. Managing stress  Stress is your body's reaction to life changes and events, both good and bad. Stress can add to your feelings of depression. Learning to manage your stress can help lessen your feelings of depression. Try some of the following approaches to reducing your stress (stress reduction techniques): Listen to music that you enjoy and that inspires you. Try using a meditation app or take a meditation class. Develop a practice that helps you connect with your spiritual self. Walk in nature, pray, or go to a place of worship. Practice deep breathing. To do this, inhale slowly through your nose. Pause at the top of your inhale for a few seconds and then exhale slowly, letting yourself relax. Repeat this three or four times. Practice yoga to help relax and work your muscles. Choose a stress reduction technique that works for you. These techniques take time and practice to develop. Set aside 5-15 minutes a day to do them. Therapists can offer training in these techniques. Do these things to help manage stress: Keep a journal. Know your limits. Set healthy boundaries for yourself and others, such as saying "no" when you think something is too much. Pay attention to how you react to certain situations. You may not be able to  control everything, but you can change your reaction. Add humor to your life by watching funny movies or shows. Make time for activities that you enjoy and that relax you. Spend less time using electronics, especially at night before bed. The light from screens can make your brain think it is time to get up rather than go to bed.  Medicines Medicines, such as antidepressants, are often a part of treatment for depression. Talk with your pharmacist or health care provider about all the medicines, supplements, and herbal products that you take, their possible side effects, and what medicines and other products are safe to take together. Make sure to report any side effects you may have to your health care provider. Relationships Your health care provider may suggest family therapy, couples therapy, or individual therapy as part of your treatment. How to recognize changes Everyone responds differently to treatment for depression. As you recover from depression, you may start to: Have more interest in doing activities. Feel more hopeful. Have more energy. Eat a more regular amount of food. Have better mental focus. It is important to recognize if your depression is not getting better or is getting worse. The symptoms you had in the beginning may return, such as: Feeling tired. Eating too much or too little. Sleeping too much or too little. Feeling restless, agitated, or hopeless. Trouble focusing or making decisions. Having unexplained aches and pains. Feeling irritable, angry, or aggressive. If you or your family members notice these symptoms coming back, let your health care provider know right away. Follow these instructions at home: Activity Try to   get some form of exercise each day, such as walking. Try yoga, mindfulness, or other stress reduction techniques. Participate in group activities if you are able. Lifestyle Get enough sleep. Cut down on or stop using caffeine, tobacco,  alcohol, and any other harmful substances. Eat a healthy diet that includes plenty of vegetables, fruits, whole grains, low-fat dairy products, and lean protein. Limit foods that are high in solid fats, added sugar, or salt (sodium). General instructions Take over-the-counter and prescription medicines only as told by your health care provider. Keep all follow-up visits. It is important for your health care provider to check on your mood, behavior, and medicines. Your health care provider may need to make changes to your treatment. Where to find support Talking to others  Friends and family members can be sources of support and guidance. Talk to trusted friends or family members about your condition. Explain your symptoms and let them know that you are working with a health care provider to treat your depression. Tell friends and family how they can help. Finances Find mental health providers that fit with your financial situation. Talk with your health care provider if you are worried about access to food, housing, or medicine. Call your insurance company to learn about your co-pays and prescription plan. Where to find more information You can find support in your area from: Anxiety and Depression Association of America (ADAA): adaa.org Mental Health [place]: mentalhealthamerica.net National Alliance on Mental Illness: nami.org Contact a health care provider if: You stop taking your antidepressant medicines, and you have any of these symptoms: Nausea. Headache. Light-headedness. Chills and body aches. Not being able to sleep (insomnia). You or your friends and family think your depression is getting worse. Get help right away if: You have thoughts of hurting yourself or others. Get help right away if you feel like you may hurt yourself or others, or have thoughts about taking your own life. Go to your nearest emergency room or: Call 911. Call the National Suicide Prevention Lifeline at  1-800-273-8255 or 988. This is open 24 hours a day. Text the Crisis Text Line at 741741. This information is not intended to replace advice given to you by your health care provider. Make sure you discuss any questions you have with your health care provider. Document Revised: 03/28/2022 Document Reviewed: 03/28/2022 Elsevier Patient Education  2024 [employer]  

## 2023-05-15 ENCOUNTER — Ambulatory Visit: Payer: Medicare Other | Admitting: Physical Therapy

## 2023-05-15 ENCOUNTER — Encounter: Payer: Self-pay | Admitting: Physical Therapy

## 2023-05-15 DIAGNOSIS — M6281 Muscle weakness (generalized): Secondary | ICD-10-CM | POA: Diagnosis not present

## 2023-05-15 DIAGNOSIS — R2681 Unsteadiness on feet: Secondary | ICD-10-CM

## 2023-05-15 DIAGNOSIS — R2689 Other abnormalities of gait and mobility: Secondary | ICD-10-CM | POA: Diagnosis not present

## 2023-05-15 LAB — URINALYSIS, ROUTINE W REFLEX MICROSCOPIC
Bacteria, UA: NONE SEEN /HPF
Bilirubin Urine: NEGATIVE
Glucose, UA: NEGATIVE
Hgb urine dipstick: NEGATIVE
Hyaline Cast: NONE SEEN /LPF
Ketones, ur: NEGATIVE
Nitrite: NEGATIVE
Specific Gravity, Urine: 1.018 (ref 1.001–1.035)
pH: 7 (ref 5.0–8.0)

## 2023-05-15 LAB — MICROSCOPIC MESSAGE

## 2023-05-15 LAB — CBC WITH DIFFERENTIAL/PLATELET
Absolute Monocytes: 495 cells/uL (ref 200–950)
Basophils Absolute: 31 cells/uL (ref 0–200)
Basophils Relative: 0.6 %
Eosinophils Absolute: 92 cells/uL (ref 15–500)
Eosinophils Relative: 1.8 %
HCT: 39.1 % (ref 35.0–45.0)
Hemoglobin: 12.6 g/dL (ref 11.7–15.5)
Lymphs Abs: 1734 cells/uL (ref 850–3900)
MCH: 27.6 pg (ref 27.0–33.0)
MCHC: 32.2 g/dL (ref 32.0–36.0)
MCV: 85.7 fL (ref 80.0–100.0)
MPV: 10.7 fL (ref 7.5–12.5)
Monocytes Relative: 9.7 %
Neutro Abs: 2749 cells/uL (ref 1500–7800)
Neutrophils Relative %: 53.9 %
Platelets: 188 10*3/uL (ref 140–400)
RBC: 4.56 10*6/uL (ref 3.80–5.10)
RDW: 13 % (ref 11.0–15.0)
Total Lymphocyte: 34 %
WBC: 5.1 10*3/uL (ref 3.8–10.8)

## 2023-05-15 LAB — COMPLETE METABOLIC PANEL WITH GFR
AG Ratio: 1.7 (calc) (ref 1.0–2.5)
ALT: 13 U/L (ref 6–29)
AST: 17 U/L (ref 10–35)
Albumin: 4.1 g/dL (ref 3.6–5.1)
Alkaline phosphatase (APISO): 72 U/L (ref 37–153)
BUN: 14 mg/dL (ref 7–25)
CO2: 30 mmol/L (ref 20–32)
Calcium: 9.8 mg/dL (ref 8.6–10.4)
Chloride: 105 mmol/L (ref 98–110)
Creat: 0.75 mg/dL (ref 0.60–0.95)
Globulin: 2.4 g/dL (calc) (ref 1.9–3.7)
Glucose, Bld: 106 mg/dL — ABNORMAL HIGH (ref 65–99)
Potassium: 4 mmol/L (ref 3.5–5.3)
Sodium: 143 mmol/L (ref 135–146)
Total Bilirubin: 0.4 mg/dL (ref 0.2–1.2)
Total Protein: 6.5 g/dL (ref 6.1–8.1)
eGFR: 77 mL/min/{1.73_m2} (ref >=60)

## 2023-05-15 LAB — URINE CULTURE
MICRO NUMBER:: 15063481
SPECIMEN QUALITY:: ADEQUATE

## 2023-05-15 LAB — LIPID PANEL
Cholesterol: 274 mg/dL — ABNORMAL HIGH (ref <200)
HDL: 59 mg/dL (ref >=50)
LDL Cholesterol (Calc): 193 mg/dL (calc) — ABNORMAL HIGH
Non-HDL Cholesterol (Calc): 215 mg/dL (calc) — ABNORMAL HIGH (ref <130)
Total CHOL/HDL Ratio: 4.6 (calc) (ref <5.0)
Triglycerides: 101 mg/dL (ref <150)

## 2023-05-15 LAB — IRON, TOTAL/TOTAL IRON BINDING CAP
%SAT: 16 % (calc) (ref 16–45)
Iron: 48 ug/dL (ref 45–160)
TIBC: 307 mcg/dL (calc) (ref 250–450)

## 2023-05-15 LAB — FERRITIN: Ferritin: 136 ng/mL (ref 16–288)

## 2023-05-15 NOTE — Therapy (Signed)
OUTPATIENT PHYSICAL THERAPY NEURO TREATMENT   Patient Name: Penny [name] MRN: [id number] DOB:[birth year], 89 y.o., female Today's Date: 05/15/2023   PCP: Lucky Cowboy, MD  REFERRING PROVIDER: Raynelle Dick, NP     END OF SESSION:  PT End of Session - 05/15/23 1113     Visit Number 12    Number of Visits 17    Date for PT Re-Evaluation 05/22/23    Authorization Type [employer] Medicare    Progress Note Due on Visit 20    PT Start Time 1100    PT Stop Time 1142    PT Time Calculation (min) 42 min    Equipment Utilized During Treatment Gait belt    Activity Tolerance Patient tolerated treatment well    Behavior During Therapy WFL for tasks assessed/performed                     Past Medical History:  Diagnosis Date   COPD (chronic obstructive pulmonary disease) with emphysema (HCC)    COVID-19 12/09/2019   Hyperlipidemia    Hypertension    Lung cancer, upper lobe (HCC) 12/04/1996   RUL   Osteoporosis    S/P partial lobectomy of lung    [id number]   Thigh shingles 12/04/1996   Thoracic aorta atherosclerosis (HCC)    Vitamin D deficiency    Past Surgical History:  Procedure Laterality Date   CARPAL TUNNEL RELEASE Right    2009   LUNG REMOVAL, PARTIAL Right    RUL 1998   OLECRANON BURSECTOMY Left 03/20/2017   Procedure: EXICISION OLECRANON BURSA LEFT ELBOW;  Surgeon: Cindee Salt, MD;  Location: [facility];  Service: Orthopedics;  Laterality: Left;  with scb block in preop   SHOULDER ADHESION RELEASE Right    2004   Patient Active Problem List   Diagnosis Date Noted   Malignant neoplasm of upper lobe of lung, unspecified laterality (HCC) 01/07/2023   Syncope    Symptomatic sinus bradycardia 09/10/2022   Hypokalemia 09/10/2022   Hypomagnesemia 09/10/2022   CAD (coronary artery disease) 09/10/2022   COVID-19 (11/30/2021) 12/01/2021   B12 deficiency 06/28/2020   Abnormal glucose 10/09/2018   Osteoporosis 12/28/2015   COPD (chronic  obstructive pulmonary disease) with emphysema (HCC) 02/25/2015   Atherosclerosis of aorta (HCC) BY ct SCAN IN 2015 02/25/2015   Essential hypertension    Hyperlipidemia, mixed    Vitamin D deficiency    Personal history of lung cancer 12/04/1996    ONSET DATE: end of 2023  REFERRING DIAG: M60.89 (ICD-10-CM) - Other myositis of multiple sites R26.81 (ICD-10-CM) - Unsteady gait  THERAPY DIAG:  Muscle weakness (generalized)  Other abnormalities of gait and mobility  Unsteadiness on feet  Rationale for Evaluation and Treatment: Rehabilitation  SUBJECTIVE:  SUBJECTIVE STATEMENT:  Per daughter, doing a little better has been doing exercises at home like she's supposed to.Had 2 falls last week trying to get OOB, legs just give out and she slides to the floor. Her back seemed really sore today, she was leaning a lot when I went to pick her up.   Per pt, "my legs have their strong moments and weak moments, I don't know why. Today is not the best day, I feel weak in my legs today. Back is sore today. This morning I was making coffee and sometimes I used the walker and sometimes I said whatever and dealt without it".   Pt accompanied by: family member Daughter  PERTINENT HISTORY: COPD, COVID, HLD, HTN, lung CA s/p partial lobectomy, osteoporosis  PAIN:  Are you having pain? No 0/10 at rest   PRECAUTIONS: Fall and Other: lung CA, osteoporosis   WEIGHT BEARING RESTRICTIONS: No  FALLS: Has patient fallen in last 6 months? Yes. Number of falls a few  LIVING ENVIRONMENT: Lives with: lives with their daughter and grandson Lives in: House/apartment Stairs:  2 steps to enter with handrail; 1 story home Has following equipment at home: Single point cane, Environmental consultant - 2 wheeled, and Tour manager  PLOF:  Independent; part-time job as a Science writer  PATIENT GOALS: improve muscle strength   OBJECTIVE:     TODAY'S TREATMENT:    05/15/23  TherEx  Nustep L3x6 minutes BLEs only goal >50spm Standing marches 5# each LE x8, frequent posterior LOB and max cues for sequencing  Forward toe taps x10 B 5# weight each LE   Gait  Gait training with yellow band tied to RW/cues to "stay close to yellow"- still had difficulty with turns but did better with straight line gait with this visual cue  Navigation of obstacle course with yellow TB on RW- stayed close to RW but gait pattern then changed to shuffling pattern and unable to correct with cues for bigger step lengths         PATIENT EDUCATION: Education details: discussed decline in mobility and cognition with daughter- advised to reach out to PCP and possible have workup with neurology if warranted  Person educated: Patient and Child(ren) Education method: Explanation Education comprehension: verbalized understanding    HOME EXERCISE PROGRAM Last updated: 04/05/23 Access Code: [email or web address] URL: [email or web address] Date: 04/05/2023 Prepared by: [facility] - Outpatient  Rehab - [facility]  Exercises - Sit to Stand with Counter Support  - 1 x daily - 5 x weekly - 2 sets - 10 reps - Standing March with Unilateral Counter Support  - 1 x daily - 5 x weekly - 2 sets - 10 reps - Standing Hip Abduction with Unilateral Counter Support  - 1 x daily - 5 x weekly - 2 sets - 10 reps - Standing Gastroc Stretch at Counter  - 1 x daily - 5 x weekly - 2 sets - 30 sec hold - Seated March  - 1 x daily - 5 x weekly - 2 sets - 10 reps - Seated Long Arc Quad  - 1 x daily - 5 x weekly - 2 sets - 10 reps - Seated Hip Abduction  - 1 x daily - 5 x weekly - 2 sets - 10 reps - Seated Hip Adduction Isometrics with Ball  - 1 x daily - 5 x weekly - 2 sets - 10 reps - 5 sec hold   -------------------------------------- Objective measures  below taken at initial eval:  DIAGNOSTIC FINDINGS: none recent  COGNITION: Overall cognitive status:  daughter assists with subjective hx    SENSATION: Daughter reports hx of N/T in B hands from carpal tunnel syndrome  POSTURE: rounded shoulders, forward head, and increased thoracic kyphosis  LOWER EXTREMITY ROM:     Active  Right Eval Left Eval  Hip flexion    Hip extension    Hip abduction    Hip adduction    Hip internal rotation    Hip external rotation    Knee flexion    Knee extension    Ankle dorsiflexion 15 8  Ankle plantarflexion    Ankle inversion    Ankle eversion     (Blank rows = not tested)  LOWER EXTREMITY MMT:    MMT Right Eval Left Eval  Hip flexion 3 3+  Hip extension    Hip abduction 3+ 3+  Hip adduction 4- 4-  Hip internal rotation    Hip external rotation    Knee flexion 4- 4-  Knee extension 4 4  Ankle dorsiflexion 4 4  Ankle plantarflexion 4 4  Ankle inversion    Ankle eversion    (Blank rows = not tested) *patient with retropulsion in sitting during testing    GAIT: Gait pattern: Shuffling gait with poor foot clearance and trunk flexed over walker  Assistive device utilized: Walker - 2 wheeled Level of assistance: SBA  FUNCTIONAL TESTS:        TODAY'S TREATMENT:                                                                                                                              DATE: 03/27/23    PATIENT EDUCATION: Education details: prognosis, POC, HEP- to be performed at counter for safety Person educated: Patient and Child(ren) Education method: Explanation, Demonstration, Tactile cues, Verbal cues, and Handouts Education comprehension: verbalized understanding and returned demonstration  HOME EXERCISE PROGRAM: Access Code: [email or web address] URL: [email or web address] Date: 03/27/2023 Prepared by: [facility] - Outpatient  Rehab - [facility]  Exercises - Sit to Stand with Counter Support  - 1 x  daily - 5 x weekly - 2 sets - 10 reps - Standing March with Unilateral Counter Support  - 1 x daily - 5 x weekly - 2 sets - 10 reps - Standing Hip Abduction with Unilateral Counter Support  - 1 x daily - 5 x weekly - 2 sets - 10 reps - Standing Gastroc Stretch at Counter  - 1 x daily - 5 x weekly - 2 sets - 30 sec hold  GOALS: Goals reviewed with patient? Yes  SHORT TERM GOALS: Target date: 04/24/2023  Patient to be independent with initial HEP. Baseline: HEP initiated Goal status: MET    LONG TERM GOALS: Target date: 05/22/2023  Patient to be independent with advanced HEP. Baseline: Not yet initiated  Goal status: IN PROGRESS  Patient to complete TUG in <30 sec with LRAD in order to decrease  risk of falls.   Baseline: 62 sec; (05/03/23) 34 sec Goal status: IN PROGRESS  Patient to demonstrate 5xSTS test in <15 sec in order to decrease risk of falls.  Baseline: 30 sec; (05/03/23) 20 sec Goal status: IN PROGRESS  Patient to score at least 46/56 on Berg in order to decrease risk of falls.  Baseline: (05/03/23) 42/56 Goal status: IN PROGRESS  Patient to complete 500 feet during 6 MWT with LRAD. Baseline: 335 feet  Goal status: IN PROGRESS  Patient to demonstrate safe use of RW with gait, turns, transfers. Baseline: unsafe currently Goal status: IN PROGRESS   ASSESSMENT:  CLINICAL IMPRESSION:   Pt arrives to clinic with her daughter, having ongoing issues with appropriate use of RW as well as multiple falls at home when LEs just "gave out" when she tried to get out of bed. Focused today's session on continued strength training and functional activity tolerance, also worked on gait with yellow TB RW for increased visual cuing for proximity to RW- seemed to help somewhat but then developed more of a shuffling gait pattern which was difficult to correct due to perseveration on yellow TB. Had a lot of difficulty with dual tasking also needed a lot of repeated cues for task at hand  due to impaired cognition. Will continue efforts.    OBJECTIVE IMPAIRMENTS: Abnormal gait, decreased activity tolerance, decreased balance, difficulty walking, decreased strength, dizziness, impaired flexibility, and postural dysfunction.   ACTIVITY LIMITATIONS: carrying, lifting, bending, sitting, standing, squatting, stairs, transfers, bathing, toileting, dressing, reach over head, hygiene/grooming, and locomotion level  PARTICIPATION LIMITATIONS: meal prep, cleaning, laundry, driving, shopping, community activity, occupation, and church  PERSONAL FACTORS: Age, Behavior pattern, Past/current experiences, Time since onset of injury/illness/exacerbation, and 3+ comorbidities: COPD, COVID, HLD, HTN, lung CA s/p partial lobectomy, osteoporosis  are also affecting patient's functional outcome.   REHAB POTENTIAL: Good  CLINICAL DECISION MAKING: Evolving/moderate complexity  EVALUATION COMPLEXITY: Moderate  PLAN:  PT FREQUENCY: 2x/week  PT DURATION: 8 weeks  PLANNED INTERVENTIONS: Therapeutic exercises, Therapeutic activity, Neuromuscular re-education, Balance training, Gait training, Patient/Family education, Self Care, Joint mobilization, Stair training, Vestibular training, Canalith repositioning, Aquatic Therapy, Dry Needling, Electrical stimulation, Cryotherapy, Moist heat, Taping, Manual therapy, and Re-evaluation  PLAN FOR NEXT SESSION: Work on stairs, foot clearance with gait, HEP review.  Continue to progress proximal strengthening and balance.  Have they noticed a difference with yellow TB on RW?   Nedra Hai PT DPT PN2   [facility] at [facility] [address] [place], [address] [place], [place] [address] Phone # [phone] Fax # [phone]

## 2023-05-16 ENCOUNTER — Ambulatory Visit: Payer: Medicare Other | Admitting: Physical Therapy

## 2023-05-18 ENCOUNTER — Encounter: Payer: Self-pay | Admitting: Physical Therapy

## 2023-05-18 ENCOUNTER — Ambulatory Visit: Payer: Medicare Other | Admitting: Physical Therapy

## 2023-05-18 DIAGNOSIS — R2681 Unsteadiness on feet: Secondary | ICD-10-CM

## 2023-05-18 DIAGNOSIS — M6281 Muscle weakness (generalized): Secondary | ICD-10-CM

## 2023-05-18 DIAGNOSIS — R2689 Other abnormalities of gait and mobility: Secondary | ICD-10-CM

## 2023-05-18 NOTE — Therapy (Signed)
OUTPATIENT PHYSICAL THERAPY NEURO TREATMENT   Patient Name: Penny [name] MRN: [id number] DOB:1933-04-21, 89 y.o., female Today's Date: 05/18/2023   PCP: Lucky Cowboy, MD  REFERRING PROVIDER: Raynelle Dick, NP     END OF SESSION:  PT End of Session - 05/18/23 1058     Visit Number 13    Number of Visits 17    Date for PT Re-Evaluation 05/22/23    Authorization Type [employer] Medicare    Progress Note Due on Visit 20    PT Start Time 1100    PT Stop Time 1150    PT Time Calculation (min) 50 min    Equipment Utilized During Treatment Gait belt    Activity Tolerance Patient tolerated treatment well    Behavior During Therapy WFL for tasks assessed/performed                      Past Medical History:  Diagnosis Date   COPD (chronic obstructive pulmonary disease) with emphysema (HCC)    COVID-19 12/09/2019   Hyperlipidemia    Hypertension    Lung cancer, upper lobe (HCC) 12/04/1996   RUL   Osteoporosis    S/P partial lobectomy of lung    RUL1998   Thigh shingles 12/04/1996   Thoracic aorta atherosclerosis (HCC)    Vitamin D deficiency    Past Surgical History:  Procedure Laterality Date   CARPAL TUNNEL RELEASE Right    2009   LUNG REMOVAL, PARTIAL Right    RUL 1998   OLECRANON BURSECTOMY Left 03/20/2017   Procedure: EXICISION OLECRANON BURSA LEFT ELBOW;  Surgeon: Cindee Salt, MD;  Location: [facility];  Service: Orthopedics;  Laterality: Left;  with scb block in preop   SHOULDER ADHESION RELEASE Right    2004   Patient Active Problem List   Diagnosis Date Noted   Malignant neoplasm of upper lobe of lung, unspecified laterality (HCC) 01/07/2023   Syncope    Symptomatic sinus bradycardia 09/10/2022   Hypokalemia 09/10/2022   Hypomagnesemia 09/10/2022   CAD (coronary artery disease) 09/10/2022   COVID-19 (11/30/2021) 12/01/2021   B12 deficiency 06/28/2020   Abnormal glucose 10/09/2018   Osteoporosis 12/28/2015   COPD  (chronic obstructive pulmonary disease) with emphysema (HCC) 02/25/2015   Atherosclerosis of aorta (HCC) BY ct SCAN IN 2015 02/25/2015   Essential hypertension    Hyperlipidemia, mixed    Vitamin D deficiency    Personal history of lung cancer 12/04/1996    ONSET DATE: end of 2023  REFERRING DIAG: M60.89 (ICD-10-CM) - Other myositis of multiple sites R26.81 (ICD-10-CM) - Unsteady gait  THERAPY DIAG:  Muscle weakness (generalized)  Other abnormalities of gait and mobility  Unsteadiness on feet  Rationale for Evaluation and Treatment: Rehabilitation  SUBJECTIVE:  SUBJECTIVE STATEMENT: Still lean forward over the walker and daughter keeps telling me to straighten up.  Knees don't feel sturdy.  Daughter says she has good days and bad days.  Some days she is super weak; daughter says use of walker has not improved.  Pt accompanied by: family member Daughter  PERTINENT HISTORY: COPD, COVID, HLD, HTN, lung CA s/p partial lobectomy, osteoporosis  PAIN:  Are you having pain? No 0/10 at rest   PRECAUTIONS: Fall and Other: lung CA, osteoporosis   WEIGHT BEARING RESTRICTIONS: No  FALLS: Has patient fallen in last 6 months? Yes. Number of falls a few  LIVING ENVIRONMENT: Lives with: lives with their daughter and grandson Lives in: House/apartment Stairs:  2 steps to enter with handrail; 1 story home Has following equipment at home: Single point cane, Environmental consultant - 2 wheeled, and Tour manager  PLOF: Independent; part-time job as a Science writer  PATIENT GOALS: improve muscle strength   OBJECTIVE:    TODAY'S TREATMENT: 05/18/2023 Activity Comments  Reviewed HEP-see below Added 3# weight to seated LAQ and marching  Standing part of HEP: Runner's stretch for gastrocs Standing march Standing hip  abduction Needs reminder cues for technique; added 3# weight for marching and hip abduction  5 x sit<>stand: 19.15 sec Improved from 30 sec at eval  Gait x 85 ft with RW with min guard Cues for upright posture, use yellow theraband as cue for step length to lessen forward lean on RW  6 MWT: 255 ft with RW Pt corrects posture with tactile cues and verbal cues; self-corrects posture <50% of the time.  Shuffling gait pattern, forward lean on walker with excess UE weightbearing through walker, decreased heelstrike, decreased step length and foot clearance.  (Initial was 335 ft, so this is a decrease)  Gait x 50 ft with RW Initial cues for posture, with distraction, she has increased forward lean and return to shuffling                PATIENT EDUCATION: Education details: objective measures taken today ( and 5x sit<>stand), plan to have neurologist appt next week then PT appointment and further discussion of PT POC.   Addition of 3# weights for seated and standing HEP Person educated: Patient and Child(ren) Education method: Explanation Education comprehension: verbalized understanding    HOME EXERCISE PROGRAM Last updated: 04/05/23>05/18/2023 Access Code: [email or web address] URL: [email or web address] Date: 04/05/2023 Prepared by: [facility] - Outpatient  Rehab - [facility] *Add 3# weights for seated and standing exercises Exercises - Sit to Stand with Counter Support  - 1 x daily - 5 x weekly - 2 sets - 10 reps - Standing March with Unilateral Counter Support  - 1 x daily - 5 x weekly - 2 sets - 10 reps - Standing Hip Abduction with Unilateral Counter Support  - 1 x daily - 5 x weekly - 2 sets - 10 reps - Standing Gastroc Stretch at Counter  - 1 x daily - 5 x weekly - 2 sets - 30 sec hold - Seated March  - 1 x daily - 5 x weekly - 2 sets - 10 reps - Seated Long Arc Quad  - 1 x daily - 5 x weekly - 2 sets - 10 reps - Seated Hip Abduction  - 1 x daily - 5 x weekly - 2  sets - 10 reps - Seated Hip Adduction Isometrics with Ball  - 1 x daily - 5 x weekly - 2 sets - 10  reps - 5 sec hold   -------------------------------------- Objective measures below taken at initial eval:  DIAGNOSTIC FINDINGS: none recent  COGNITION: Overall cognitive status:  daughter assists with subjective hx    SENSATION: Daughter reports hx of N/T in B hands from carpal tunnel syndrome  POSTURE: rounded shoulders, forward head, and increased thoracic kyphosis  LOWER EXTREMITY ROM:     Active  Right Eval Left Eval  Hip flexion    Hip extension    Hip abduction    Hip adduction    Hip internal rotation    Hip external rotation    Knee flexion    Knee extension    Ankle dorsiflexion 15 8  Ankle plantarflexion    Ankle inversion    Ankle eversion     (Blank rows = not tested)  LOWER EXTREMITY MMT:    MMT Right Eval Left Eval  Hip flexion 3 3+  Hip extension    Hip abduction 3+ 3+  Hip adduction 4- 4-  Hip internal rotation    Hip external rotation    Knee flexion 4- 4-  Knee extension 4 4  Ankle dorsiflexion 4 4  Ankle plantarflexion 4 4  Ankle inversion    Ankle eversion    (Blank rows = not tested) *patient with retropulsion in sitting during testing    GAIT: Gait pattern: Shuffling gait with poor foot clearance and trunk flexed over walker  Assistive device utilized: Walker - 2 wheeled Level of assistance: SBA  FUNCTIONAL TESTS:        TODAY'S TREATMENT:                                                                                                                              DATE: 03/27/23    PATIENT EDUCATION: Education details: prognosis, POC, HEP- to be performed at counter for safety Person educated: Patient and Child(ren) Education method: Explanation, Demonstration, Tactile cues, Verbal cues, and Handouts Education comprehension: verbalized understanding and returned demonstration  HOME EXERCISE PROGRAM: Access Code:  [email or web address] URL: [email or web address] Date: 03/27/2023 Prepared by: [facility] - Outpatient  Rehab - [facility]  Exercises - Sit to Stand with Counter Support  - 1 x daily - 5 x weekly - 2 sets - 10 reps - Standing March with Unilateral Counter Support  - 1 x daily - 5 x weekly - 2 sets - 10 reps - Standing Hip Abduction with Unilateral Counter Support  - 1 x daily - 5 x weekly - 2 sets - 10 reps - Standing Gastroc Stretch at Counter  - 1 x daily - 5 x weekly - 2 sets - 30 sec hold  GOALS: Goals reviewed with patient? Yes  SHORT TERM GOALS: Target date: 04/24/2023  Patient to be independent with initial HEP. Baseline: HEP initiated Goal status: MET    LONG TERM GOALS: Target date: 05/22/2023  Patient to be independent with advanced HEP. Baseline: Not yet initiated; needs cues  Goal status: NOT MET 05/18/2023  Patient to complete TUG in <30 sec with LRAD in order to decrease risk of falls.   Baseline: 62 sec; (05/03/23) 34 sec Goal status: IN PROGRESS  Patient to demonstrate 5xSTS test in <15 sec in order to decrease risk of falls.  Baseline: 30 sec; (05/03/23) 20 sec; 19.15 sec 05/18/2023 Goal status: IN PROGRESS  Patient to score at least 46/56 on Berg in order to decrease risk of falls.  Baseline: (05/03/23) 42/56 Goal status: IN PROGRESS  Patient to complete 500 feet during 6 MWT with LRAD. Baseline: 335 feet >255 ft Goal status: NOT MET 05/18/2023  Patient to demonstrate safe use of RW with gait, turns, transfers. Baseline: unsafe currently Goal status: IN PROGRESS   ASSESSMENT:  CLINICAL IMPRESSION: Pt and daughter continue to report "good days and bad days with mobility."  Daughter continues to be concerned about forward flexed posture and significant reliance on RW.  With initial (and then frequent) cues, pt is able to maintain upright posture and stay close within BOS.  However, with progression of time of gait (ex-6MWT) and with fatigue,  distractions, pt demo increased shuffling gait pattern, decreased single limb stance time, decreased foot clearance and step length with gait, as well as increased (and unsafe) forward flexed posture and pushing RW too far forward with gait and turns.  Pt easily distracted during gait with conversation tasks, decreasing gait safety.  Began assessing goals today, with pt partially meeting LTG 1 (needs supervision and reminder cues for HEP) and not meeting LTG for 6 MWT (*of note, distance has decreased since initial assessment).  Pt continues to have decreased functional mobility and is at increased fall risk; to have neurologist consult next week. OBJECTIVE IMPAIRMENTS: Abnormal gait, decreased activity tolerance, decreased balance, difficulty walking, decreased strength, dizziness, impaired flexibility, and postural dysfunction.   ACTIVITY LIMITATIONS: carrying, lifting, bending, sitting, standing, squatting, stairs, transfers, bathing, toileting, dressing, reach over head, hygiene/grooming, and locomotion level  PARTICIPATION LIMITATIONS: meal prep, cleaning, laundry, driving, shopping, community activity, occupation, and church  PERSONAL FACTORS: Age, Behavior pattern, Past/current experiences, Time since onset of injury/illness/exacerbation, and 3+ comorbidities: COPD, COVID, HLD, HTN, lung CA s/p partial lobectomy, osteoporosis  are also affecting patient's functional outcome.   REHAB POTENTIAL: Good  CLINICAL DECISION MAKING: Evolving/moderate complexity  EVALUATION COMPLEXITY: Moderate  PLAN:  PT FREQUENCY: 2x/week  PT DURATION: 8 weeks  PLANNED INTERVENTIONS: Therapeutic exercises, Therapeutic activity, Neuromuscular re-education, Balance training, Gait training, Patient/Family education, Self Care, Joint mobilization, Stair training, Vestibular training, Canalith repositioning, Aquatic Therapy, Dry Needling, Electrical stimulation, Cryotherapy, Moist heat, Taping, Manual therapy,  and Re-evaluation  PLAN FOR NEXT SESSION: Assess LTGs and discuss further POC and appts.  How did neurologist appt go?Ask about weights and HEP  Lonia Blood, PT 05/18/23 1:09 PM Phone: [phone] Fax: [phone]   [facility] Outpatient Rehab at [facility] [address], [place] [address] Phone # [phone] Fax # [phone]

## 2023-05-21 ENCOUNTER — Encounter: Payer: Self-pay | Admitting: Neurology

## 2023-05-21 ENCOUNTER — Ambulatory Visit: Payer: Medicare Other | Admitting: Neurology

## 2023-05-21 ENCOUNTER — Telehealth: Payer: Self-pay | Admitting: Neurology

## 2023-05-21 VITALS — BP 155/95 | HR 87 | Ht 62.0 in | Wt 139.8 lb

## 2023-05-21 DIAGNOSIS — R29898 Other symptoms and signs involving the musculoskeletal system: Secondary | ICD-10-CM | POA: Diagnosis not present

## 2023-05-21 DIAGNOSIS — Z85118 Personal history of other malignant neoplasm of bronchus and lung: Secondary | ICD-10-CM | POA: Diagnosis not present

## 2023-05-21 DIAGNOSIS — C3410 Malignant neoplasm of upper lobe, unspecified bronchus or lung: Secondary | ICD-10-CM

## 2023-05-21 DIAGNOSIS — R269 Unspecified abnormalities of gait and mobility: Secondary | ICD-10-CM | POA: Diagnosis not present

## 2023-05-21 DIAGNOSIS — S0990XA Unspecified injury of head, initial encounter: Secondary | ICD-10-CM | POA: Insufficient documentation

## 2023-05-21 NOTE — Progress Notes (Addendum)
[facility]  Provider:  Dr Lima Chillemi Referring Provider: Raynelle Dick, NP Primary Care Physician:  Lucky Cowboy, MD  Chief Complaint  Patient presents with   New Patient (Initial Visit)    Patient in room #1 with her daughter sandy. Patient states she here today to discuss her the fall she had in December 2023.(?)  per PCP note : this is a suspected myositis patient, NP Wilkerson.     HPI:  Penny [name] is a 89 y.o. female and seen here upon referral from NP Aundria Rud for a Consultation/ Evaluation of weakness, Fall(s) in December 2023, Remote dx of lung cancer in the 1980s, Myositis(?) but rheumatologist has not found evidence of rheumatological origin, we are also needing a work up of paraneoplastic factors. Seen by Dr Deanne Coffer, [phone], No notes accessible.   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  Over the last 30 days there is now a sudden weakness, she is now using a rollator -walker , while she had been able in March, April 2024 to stand 6 hours and walk unassisted at her part time job.  PT was initiated.   This patient reports onset of weakness over a period of 8 weeks. The left leg being more affected than the right, she had no MRIs.   She has PT twice weekly. She just had blood work , reviewed here today.  10/20/2014   FINDINGS: Brain: No acute intracranial findings are seen. Symmetric high density foci are noted in basal ganglia on both sides suggesting calcifications. Cortical sulci are prominent. There is decreased density in periventricular and subcortical white matter.    IMPRESSION: No acute intracranial findings are seen. High density is seen in basal ganglia on  both sides, most likely basal ganglia calcifications. There is no adjacent edema or mass effect. Atrophy. Schlichting-vessel disease.   Electronically Signed   By: Ernie Avena M.D.   On: 11/13/2022 14:42    Review of Systems: Out of a complete 14 system review, the patient complains of only the following symptoms, and all other reviewed systems are negative. Weakness, falls, no pain.   Shuffling, left leg buckles.   Cognitively intact   MRI negative for stroke   Social History   Socioeconomic History   Marital status: Widowed    Spouse name: Not on file   Number of children: Not on file   Years of education: Not on file   Highest education level: Not on file  Occupational History   Not on file  Tobacco Use   Smoking status: Former    Types: Cigarettes    Quit date: 12/04/1996    Years since quitting: 26.4   Smokeless tobacco: Never  Vaping Use   Vaping Use: Never used  Substance and Sexual Activity   Alcohol use: Yes    Alcohol/week: 1.0 standard drink of alcohol    Types: 1 Glasses of wine per week    Comment: social   Drug use: No   Sexual activity: Not on file  Other Topics Concern   Not on file  Social History Narrative   Not on file   Social Determinants of Health   Financial Resource Strain:  Not on file  Food Insecurity: No Food Insecurity (09/14/2022)   Hunger Vital Sign    Worried About Running Out of Food in the Last Year: Never true    Ran Out of Food in the Last Year: Never true  Transportation Needs: No Transportation Needs (09/14/2022)   PRAPARE - Administrator, Civil Service (Medical): No    Lack of Transportation (Non-Medical): No  Physical Activity: Not on file  Stress: Not on file  Social Connections: Not on file  Intimate Partner Violence: Not on file    Family History  Problem Relation Age of Onset   Heart disease Mother    Lung cancer Father    Breast cancer Paternal Aunt     Past Medical History:  Diagnosis  Date   COPD (chronic obstructive pulmonary disease) with emphysema (HCC)    COVID-19 12/09/2019   Hyperlipidemia    Hypertension    Lung cancer, upper lobe (HCC) 12/04/1996   RUL   Osteoporosis    S/P partial lobectomy of lung    [id number]   Thigh shingles 12/04/1996   Thoracic aorta atherosclerosis (HCC)    Vitamin D deficiency     Past Surgical History:  Procedure Laterality Date   CARPAL TUNNEL RELEASE Right    2009   LUNG REMOVAL, PARTIAL Right    RUL 1998   OLECRANON BURSECTOMY Left 03/20/2017   Procedure: EXICISION OLECRANON BURSA LEFT ELBOW;  Surgeon: Cindee Salt, MD;  Location: [facility];  Service: Orthopedics;  Laterality: Left;  with scb block in preop   SHOULDER ADHESION RELEASE Right    2004    Current Outpatient Medications  Medication Sig Dispense Refill   aspirin 81 MG chewable tablet Chew 81 mg by mouth every evening.     Budeson-Glycopyrrol-Formoterol (BREZTRI AEROSPHERE) 160-9-4.8 MCG/ACT AERO Inhale 1 puff into the lungs in the morning and at bedtime. 10.7 g 3   buPROPion (WELLBUTRIN) 75 MG tablet One tablet once a day 90 tablet 0   cetirizine (ZYRTEC) 10 MG tablet Takes 1 tablet Daily (Patient taking differently: Take 10 mg by mouth every evening.)     montelukast (SINGULAIR) 10 MG tablet Take  1 tablet  Daily for Allergies                                                            /                            TAKE                      BY                     MOUTH 90 tablet 3   Cholecalciferol 125 MCG (5000 UT) capsule Take 1 capsules Daily (Patient not taking: Reported on 03/21/2023) 90 capsule 99   Cyanocobalamin (VITAMIN B-12) 1000 MCG SUBL Takes 1 tab SL Daily (Patient not taking: Reported on 03/21/2023)  0   simvastatin (ZOCOR) 40 MG tablet Take  1 tablet  every Night for Cholesterol                                                         /  TAKE                                                    BY                                                       MOUTH (Patient not taking: Reported on 05/14/2023) 90 tablet 3   No current facility-administered medications for this visit.    Allergies as of 05/21/2023 - Review Complete 05/21/2023  Allergen Reaction Noted   Ace inhibitors Cough 09/27/2013   Calcium-containing compounds Other (See Comments) 09/27/2013   Lactose intolerance (gi) Diarrhea 09/27/2013   Polysporin [bacitracin-polymyxin b] Rash 03/20/2017    Vitals: BP (!) 155/95 (BP Location: Left Arm, Patient Position: Sitting, Cuff Size: Gorder)   Pulse 87   Ht 5\' 2"  (1.575 m)   Wt 139 lb 12.8 oz (63.4 kg)   BMI 25.57 kg/m  Last Weight:  Wt Readings from Last 1 Encounters:  05/21/23 139 lb 12.8 oz (63.4 kg)   Last Height:   Ht Readings from Last 1 Encounters:  05/21/23 5\' 2"  (1.575 m)   Last BMI: Physical exam:  General: The patient is awake, alert and appears not in acute distress.  The patient is well groomed. Head: Normocephalic, atraumatic.  Neck is supple. No Goiter   Cardiovascular:  Regular rate and palpable peripheral pulse:  Respiratory: clear to auscultation.   Skin:  With evidence of mild ankle edema, no rash Trunk:  core strength weakness. stooped  Neurologic exam : There is a normal attention span & concentration ability.  Speech is fluent without = dysarthria, dysphonia or aphasia.  Mood and affect are appropriate.  Cranial nerves: Pupils are equal and briskly reactive to light. Funduscopic exam without  evidence of pallor or edema. Extraocular movements  in vertical and horizontal planes intact and without nystagmus.  Visual fields by finger perimetry are intact. Hearing is severely impaired.   Facial sensation intact to fine touch. Facial motor strength is symmetric and tongue and uvula move midline.  Motor exam:  there is  elevated biceps and wrist  tone and symmetric muscle bulk and symmetric loss of strength in upper extremities. Grip Strength  weakness, bilaterally. She has been dragging her feet, left foot points outwards, shuffling gait, needs an associate to help her transfer- she is visibly  stooped, leaning forward. Propulsion risk.  Proximal strength of shoulder muscles and hip flexors was weak.  Her Core-strength is poor, she is leaning to the left. Knees are "sore".  Left leg presented with brisk DTRs and up-going toe.   Sensory:  Fine touch and vibration were tested . Proprioception was tested in the upper extremities only and was normal. Mild tremor on finger to nose.   Coordination: Rapid alternating movements in the fingers/hands were normal.  Finger-to-nose maneuver was tested and showed no evidence of ataxia, dysmetria but mild bilateral tremor.  Gait and station: Patient walked with major assistance, slightly stooped, Prest steps, shuffling, rigid posture, leaning left,  left foot pointing outwards,   Deep tendon reflexes: in the  upper and lower extremities are symmetric and very brisk without Clonus, but hyperreflexia in both  legs, very impaired balance.  High fall risk.  Babinski maneuver response is up-going.   Assessment: Total time for face to face interview and examination, for review of  images and laboratory testing, neurophysiology testing and pre-existing records, including out-of -network , was 45 minutes. Assessment is as follows here:  1) Progressive gait disorder with muscle weakness, rigor ,  affecting the left leg more pronounced.  2) High fall risk , leaning to the left , drifting forward.  3) brisk reflexes, in combination with shuffling gait, wide based stance- she is recently only able to walk with assistance.   Plan:  Treatment plan and additional workup planned after today includes:  Rv in 12  weeks   Requesting rheumatology notes.   1)  EMG and NCV ordered with Dr Terrace Arabia, was rescheduled for 07-11-2023.  2)  Brain and cervical spine MRI ordered  ,  I am still considering atypical  parkinsonism, diffuse vascular disease. PT to continue.  3) depending on EMG and NCV outcome, I will further consider DAT scan/ paraneoplastic panel, etc.  Depending on MRI brain and spine outcome, may need additional work up for vascular risk factors, EEG, NPH.   Melvyn Novas, MD

## 2023-05-21 NOTE — Patient Instructions (Signed)
Assessment: Total time for face to face interview and examination, for review of  images and laboratory testing, neurophysiology testing and pre-existing records, including out-of -network , was 45 minutes. Assessment is as follows here:   1)  Progressive gait disorder with muscle weakness, rigor ,  left leg most pronounced.  2) High fall risk , leaning to the left ,  3) brisk reflexes, shuffling gait, wide base- only able to walk with assistance.    Plan:  Treatment plan and additional workup planned after today includes:  Rv in 12  weeks    Requesting rheumatology notes.    1)  EMG and NCV 2)  Brain and cervical spine MRI , still considering  atypical parkinsonism, diffuse vascular disease. 3) depending on EMG and NCV outcome, I will further consider DAT scan/ paraneoplastic panel, etc.    Melvyn Novas, MD

## 2023-05-21 NOTE — Telephone Encounter (Signed)
[employer] medicare NPR sent to GI [phone] 

## 2023-05-22 ENCOUNTER — Ambulatory Visit: Payer: Medicare Other | Admitting: Physical Therapy

## 2023-05-23 NOTE — Therapy (Signed)
OUTPATIENT PHYSICAL THERAPY NEURO TREATMENT   Patient Name: Penny [name] MRN: [id number] DOB:[birth year], 89 y.o., female Today's Date: 05/23/2023   PCP: Lucky Cowboy, MD  REFERRING PROVIDER: Raynelle Dick, NP     END OF SESSION:             Past Medical History:  Diagnosis Date   COPD (chronic obstructive pulmonary disease) with emphysema (HCC)    COVID-19 12/09/2019   Hyperlipidemia    Hypertension    Lung cancer, upper lobe (HCC) 12/04/1996   RUL   Osteoporosis    S/P partial lobectomy of lung    [id number]   Thigh shingles 12/04/1996   Thoracic aorta atherosclerosis (HCC)    Vitamin D deficiency    Past Surgical History:  Procedure Laterality Date   CARPAL TUNNEL RELEASE Right    2009   LUNG REMOVAL, PARTIAL Right    RUL 1998   OLECRANON BURSECTOMY Left 03/20/2017   Procedure: EXICISION OLECRANON BURSA LEFT ELBOW;  Surgeon: Cindee Salt, MD;  Location: [facility];  Service: Orthopedics;  Laterality: Left;  with scb block in preop   SHOULDER ADHESION RELEASE Right    2004   Patient Active Problem List   Diagnosis Date Noted   Rigidity (muscles) 05/21/2023   Traumatic head injury less than 3 months ago 05/21/2023   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   Malignant neoplasm of upper lobe of lung, unspecified laterality (HCC) 01/07/2023   Syncope    Symptomatic sinus bradycardia 09/10/2022   Hypokalemia 09/10/2022   Hypomagnesemia 09/10/2022   CAD (coronary artery disease) 09/10/2022   COVID-19 (11/30/2021) 12/01/2021   B12 deficiency 06/28/2020   Abnormal glucose 10/09/2018   Osteoporosis 12/28/2015   COPD (chronic obstructive pulmonary disease) with emphysema (HCC) 02/25/2015   Atherosclerosis of aorta (HCC) BY ct SCAN IN 2015 02/25/2015   Essential hypertension    Hyperlipidemia, mixed    Vitamin D deficiency    Personal history of lung cancer 12/04/1996    ONSET DATE: end of 2023  REFERRING DIAG: M60.89 (ICD-10-CM)  - Other myositis of multiple sites R26.81 (ICD-10-CM) - Unsteady gait  THERAPY DIAG:  No diagnosis found.  Rationale for Evaluation and Treatment: Rehabilitation  SUBJECTIVE:                                                                                                                                                                                             SUBJECTIVE STATEMENT: Still lean forward over the walker and daughter keeps telling me to straighten up.  Knees don't feel sturdy.  Daughter says she has good days and bad days.  Some days she is super weak; daughter says use of walker has not improved.  Pt accompanied by: family member Daughter  PERTINENT HISTORY: COPD, COVID, HLD, HTN, lung CA s/p partial lobectomy, osteoporosis  PAIN:  Are you having pain? No 0/10 at rest   PRECAUTIONS: Fall and Other: lung CA, osteoporosis   WEIGHT BEARING RESTRICTIONS: No  FALLS: Has patient fallen in last 6 months? Yes. Number of falls a few  LIVING ENVIRONMENT: Lives with: lives with their daughter and grandson Lives in: House/apartment Stairs:  2 steps to enter with handrail; 1 story home Has following equipment at home: Single point cane, Environmental consultant - 2 wheeled, and Tour manager  PLOF: Independent; part-time job as a Science writer  PATIENT GOALS: improve muscle strength   OBJECTIVE:     TODAY'S TREATMENT: 05/24/23 Activity Comments                        HOME EXERCISE PROGRAM Last updated: 04/05/23>05/18/2023 Access Code: [email or web address] URL: [email or web address] Date: 04/05/2023 Prepared by: [facility] - Outpatient  Rehab - [facility] *Add 3# weights for seated and standing exercises Exercises - Sit to Stand with Counter Support  - 1 x daily - 5 x weekly - 2 sets - 10 reps - Standing March with Unilateral Counter Support  - 1 x daily - 5 x weekly - 2 sets - 10 reps - Standing Hip Abduction with Unilateral Counter Support  - 1 x daily - 5 x weekly -  2 sets - 10 reps - Standing Gastroc Stretch at Counter  - 1 x daily - 5 x weekly - 2 sets - 30 sec hold - Seated March  - 1 x daily - 5 x weekly - 2 sets - 10 reps - Seated Long Arc Quad  - 1 x daily - 5 x weekly - 2 sets - 10 reps - Seated Hip Abduction  - 1 x daily - 5 x weekly - 2 sets - 10 reps - Seated Hip Adduction Isometrics with Ball  - 1 x daily - 5 x weekly - 2 sets - 10 reps - 5 sec hold   -------------------------------------- Objective measures below taken at initial eval:  DIAGNOSTIC FINDINGS: none recent  COGNITION: Overall cognitive status:  daughter assists with subjective hx    SENSATION: Daughter reports hx of N/T in B hands from carpal tunnel syndrome  POSTURE: rounded shoulders, forward head, and increased thoracic kyphosis  LOWER EXTREMITY ROM:     Active  Right Eval Left Eval  Hip flexion    Hip extension    Hip abduction    Hip adduction    Hip internal rotation    Hip external rotation    Knee flexion    Knee extension    Ankle dorsiflexion 15 8  Ankle plantarflexion    Ankle inversion    Ankle eversion     (Blank rows = not tested)  LOWER EXTREMITY MMT:    MMT Right Eval Left Eval  Hip flexion 3 3+  Hip extension    Hip abduction 3+ 3+  Hip adduction 4- 4-  Hip internal rotation    Hip external rotation    Knee flexion 4- 4-  Knee extension 4 4  Ankle dorsiflexion 4 4  Ankle plantarflexion 4 4  Ankle inversion    Ankle eversion    (Blank rows = not tested) *patient with retropulsion in sitting during testing    GAIT: Gait pattern: Shuffling  gait with poor foot clearance and trunk flexed over walker  Assistive device utilized: Walker - 2 wheeled Level of assistance: SBA  FUNCTIONAL TESTS:        TODAY'S TREATMENT:                                                                                                                              DATE: 03/27/23    PATIENT EDUCATION: Education details: prognosis, POC, HEP- to  be performed at counter for safety Person educated: Patient and Child(ren) Education method: Explanation, Demonstration, Tactile cues, Verbal cues, and Handouts Education comprehension: verbalized understanding and returned demonstration  HOME EXERCISE PROGRAM: Access Code: [email or web address] URL: [email or web address] Date: 03/27/2023 Prepared by: [facility] - Outpatient  Rehab - [facility]  Exercises - Sit to Stand with Counter Support  - 1 x daily - 5 x weekly - 2 sets - 10 reps - Standing March with Unilateral Counter Support  - 1 x daily - 5 x weekly - 2 sets - 10 reps - Standing Hip Abduction with Unilateral Counter Support  - 1 x daily - 5 x weekly - 2 sets - 10 reps - Standing Gastroc Stretch at Counter  - 1 x daily - 5 x weekly - 2 sets - 30 sec hold  GOALS: Goals reviewed with patient? Yes  SHORT TERM GOALS: Target date: 04/24/2023  Patient to be independent with initial HEP. Baseline: HEP initiated Goal status: MET    LONG TERM GOALS: Target date: 05/22/2023  Patient to be independent with advanced HEP. Baseline: Not yet initiated; needs cues  Goal status: NOT MET 05/18/2023  Patient to complete TUG in <30 sec with LRAD in order to decrease risk of falls.   Baseline: 62 sec; (05/03/23) 34 sec Goal status: IN PROGRESS  Patient to demonstrate 5xSTS test in <15 sec in order to decrease risk of falls.  Baseline: 30 sec; (05/03/23) 20 sec; 19.15 sec 05/18/2023 Goal status: IN PROGRESS  Patient to score at least 46/56 on Berg in order to decrease risk of falls.  Baseline: (05/03/23) 42/56 Goal status: IN PROGRESS  Patient to complete 500 feet during 6 MWT with LRAD. Baseline: 335 feet >255 ft Goal status: NOT MET 05/18/2023  Patient to demonstrate safe use of RW with gait, turns, transfers. Baseline: unsafe currently Goal status: IN PROGRESS   ASSESSMENT:  CLINICAL IMPRESSION: Pt and daughter continue to report "good days and bad days with mobility."   Daughter continues to be concerned about forward flexed posture and significant reliance on RW.  With initial (and then frequent) cues, pt is able to maintain upright posture and stay close within BOS.  However, with progression of time of gait (ex-6MWT) and with fatigue, distractions, pt demo increased shuffling gait pattern, decreased single limb stance time, decreased foot clearance and step length with gait, as well as increased (and unsafe) forward flexed posture and pushing RW too far forward with gait and turns.  Pt easily distracted during gait with conversation tasks, decreasing gait safety.  Began assessing goals today, with pt partially meeting LTG 1 (needs supervision and reminder cues for HEP) and not meeting LTG for 6 MWT (*of note, distance has decreased since initial assessment).  Pt continues to have decreased functional mobility and is at increased fall risk; to have neurologist consult next week. OBJECTIVE IMPAIRMENTS: Abnormal gait, decreased activity tolerance, decreased balance, difficulty walking, decreased strength, dizziness, impaired flexibility, and postural dysfunction.   ACTIVITY LIMITATIONS: carrying, lifting, bending, sitting, standing, squatting, stairs, transfers, bathing, toileting, dressing, reach over head, hygiene/grooming, and locomotion level  PARTICIPATION LIMITATIONS: meal prep, cleaning, laundry, driving, shopping, community activity, occupation, and church  PERSONAL FACTORS: Age, Behavior pattern, Past/current experiences, Time since onset of injury/illness/exacerbation, and 3+ comorbidities: COPD, COVID, HLD, HTN, lung CA s/p partial lobectomy, osteoporosis  are also affecting patient's functional outcome.   REHAB POTENTIAL: Good  CLINICAL DECISION MAKING: Evolving/moderate complexity  EVALUATION COMPLEXITY: Moderate  PLAN:  PT FREQUENCY: 2x/week  PT DURATION: 8 weeks  PLANNED INTERVENTIONS: Therapeutic exercises, Therapeutic activity,  Neuromuscular re-education, Balance training, Gait training, Patient/Family education, Self Care, Joint mobilization, Stair training, Vestibular training, Canalith repositioning, Aquatic Therapy, Dry Needling, Electrical stimulation, Cryotherapy, Moist heat, Taping, Manual therapy, and Re-evaluation  PLAN FOR NEXT SESSION: Assess LTGs and discuss further POC and appts.  How did neurologist appt go?Ask about weights and HEP

## 2023-05-24 ENCOUNTER — Encounter: Payer: Self-pay | Admitting: Physical Therapy

## 2023-05-24 ENCOUNTER — Ambulatory Visit: Payer: Medicare Other | Admitting: Physical Therapy

## 2023-05-24 DIAGNOSIS — R2689 Other abnormalities of gait and mobility: Secondary | ICD-10-CM

## 2023-05-24 DIAGNOSIS — M6281 Muscle weakness (generalized): Secondary | ICD-10-CM

## 2023-05-24 DIAGNOSIS — R2681 Unsteadiness on feet: Secondary | ICD-10-CM | POA: Diagnosis not present

## 2023-05-25 NOTE — Therapy (Signed)
OUTPATIENT PHYSICAL THERAPY NEURO TREATMENT   Patient Name: Penny [name] MRN: [id number] DOB:1933/05/30, 89 y.o., female Today's Date: 05/28/2023   PCP: Lucky Cowboy, MD  REFERRING PROVIDER: Raynelle Dick, NP     END OF SESSION:  PT End of Session - 05/28/23 1709     Visit Number 15    Number of Visits 26    Date for PT Re-Evaluation 07/05/23    Authorization Type [employer] Medicare    Progress Note Due on Visit 20    PT Start Time 1617    PT Stop Time 1703    PT Time Calculation (min) 46 min    Equipment Utilized During Treatment Gait belt    Activity Tolerance Patient tolerated treatment well    Behavior During Therapy WFL for tasks assessed/performed                        Past Medical History:  Diagnosis Date   COPD (chronic obstructive pulmonary disease) with emphysema (HCC)    COVID-19 12/09/2019   Hyperlipidemia    Hypertension    Lung cancer, upper lobe (HCC) 12/04/1996   RUL   Osteoporosis    S/P partial lobectomy of lung    [id number]   Thigh shingles 12/04/1996   Thoracic aorta atherosclerosis (HCC)    Vitamin D deficiency    Past Surgical History:  Procedure Laterality Date   CARPAL TUNNEL RELEASE Right    2009   LUNG REMOVAL, PARTIAL Right    RUL 1998   OLECRANON BURSECTOMY Left 03/20/2017   Procedure: EXICISION OLECRANON BURSA LEFT ELBOW;  Surgeon: Cindee Salt, MD;  Location: [facility];  Service: Orthopedics;  Laterality: Left;  with scb block in preop   SHOULDER ADHESION RELEASE Right    2004   Patient Active Problem List   Diagnosis Date Noted   Rigidity (muscles) 05/21/2023   Traumatic head injury less than 3 months ago 05/21/2023   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   Malignant neoplasm of upper lobe of lung, unspecified laterality (HCC) 01/07/2023   Syncope    Symptomatic sinus bradycardia 09/10/2022   Hypokalemia 09/10/2022   Hypomagnesemia 09/10/2022   CAD (coronary artery disease) 09/10/2022    COVID-19 (11/30/2021) 12/01/2021   B12 deficiency 06/28/2020   Abnormal glucose 10/09/2018   Osteoporosis 12/28/2015   COPD (chronic obstructive pulmonary disease) with emphysema (HCC) 02/25/2015   Atherosclerosis of aorta (HCC) BY ct SCAN IN 2015 02/25/2015   Essential hypertension    Hyperlipidemia, mixed    Vitamin D deficiency    Personal history of lung cancer 12/04/1996    ONSET DATE: end of 2023  REFERRING DIAG: M60.89 (ICD-10-CM) - Other myositis of multiple sites R26.81 (ICD-10-CM) - Unsteady gait  THERAPY DIAG:  Muscle weakness (generalized)  Other abnormalities of gait and mobility  Unsteadiness on feet  Rationale for Evaluation and Treatment: Rehabilitation  SUBJECTIVE:  SUBJECTIVE STATEMENT: Felt dizzy since she got up this AM. Went from light dizziness and requiring to hold on but then eased off as the morning went on. Denies dizziness currently. Denies N/T, SOB.   Pt accompanied by: family member Daughter  PERTINENT HISTORY: COPD, COVID, HLD, HTN, lung CA s/p partial lobectomy, osteoporosis  PAIN:  Are you having pain? No   PRECAUTIONS: Fall and Other: lung CA, osteoporosis   WEIGHT BEARING RESTRICTIONS: No  FALLS: Has patient fallen in last 6 months? Yes. Number of falls a few  LIVING ENVIRONMENT: Lives with: lives with their daughter and grandson Lives in: House/apartment Stairs:  2 steps to enter with handrail; 1 story home Has following equipment at home: Single point cane, Environmental consultant - 2 wheeled, and Tour manager  PLOF: Independent; part-time job as a Science writer  PATIENT GOALS: improve muscle strength   OBJECTIVE:     TODAY'S TREATMENT: 05/28/23 Activity Comments  Vitals at start of session 94%, 87bpm , 170/16mmHg. Pt with R lateral lean in sitting-  daughter reports this is not new  Shoulder flexion  B WNL  smile symmetrical  grip Strong and symmetrical  B ankle DF MMT 4+/5 each   Vitals  154/100 mmHg, 93%, 102 bpm   STS 5x Pushing off armrests; good stability with RW in front  stair navigation 4x Alt reciprocal with B handrail; posterior sway when descending requiring CGA-min A  Alt step ups on 6" step 5x each 1 UE support   Romberg 30" 15 sec before posterior LOB  staggered ant/pos wt shift B UE support; initial manual assist to help in coordinating movement; improved with practice   lateral wt shift nitial manual assist to help in coordinating movement; improved with practice   Vitals at end of session 170/105 mmHg          HOME EXERCISE PROGRAM Last updated: 05/28/23 Access Code: [email or web address] URL: [email or web address] Date: 05/28/2023 Prepared by: [facility] - Outpatient  Rehab - [facility]  Exercises - Sit to Stand with Counter Support  - 1 x daily - 5 x weekly - 2 sets - 10 reps - Standing March with Unilateral Counter Support  - 1 x daily - 5 x weekly - 2 sets - 10 reps - Standing Hip Abduction with Unilateral Counter Support  - 1 x daily - 5 x weekly - 2 sets - 10 reps - Standing Gastroc Stretch at Counter  - 1 x daily - 5 x weekly - 2 sets - 30 sec hold - Seated March  - 1 x daily - 5 x weekly - 2 sets - 10 reps - Seated Long Arc Quad  - 1 x daily - 5 x weekly - 2 sets - 10 reps - Seated Hip Abduction  - 1 x daily - 5 x weekly - 2 sets - 10 reps - Seated Hip Adduction Isometrics with Ball  - 1 x daily - 5 x weekly - 2 sets - 10 reps - 5 sec hold - Staggered Stance Forward Backward Weight Shift with Counter Support  - 1 x daily - 5 x weekly - 2 sets - 10 reps - Side to Side Weight Shift with Counter Support  - 1 x daily - 5 x weekly - 2 sets - 10 reps    PATIENT EDUCATION: Education details: edu on stroke signs and symptoms and to call 911 if these symptoms occur, advised to continue monitoring BP  at home and that PCP might need to be  aware of elevated BP, HEP Person educated: Patient and Child(ren) Education method: Explanation, Demonstration, Tactile cues, Verbal cues, and Handouts Education comprehension: verbalized understanding and returned demonstration    -------------------------------------- Objective measures below taken at initial eval:  DIAGNOSTIC FINDINGS: none recent  COGNITION: Overall cognitive status:  daughter assists with subjective hx    SENSATION: Daughter reports hx of N/T in B hands from carpal tunnel syndrome  POSTURE: rounded shoulders, forward head, and increased thoracic kyphosis  LOWER EXTREMITY ROM:     Active  Right Eval Left Eval  Hip flexion    Hip extension    Hip abduction    Hip adduction    Hip internal rotation    Hip external rotation    Knee flexion    Knee extension    Ankle dorsiflexion 15 8  Ankle plantarflexion    Ankle inversion    Ankle eversion     (Blank rows = not tested)  LOWER EXTREMITY MMT:    MMT Right Eval Left Eval  Hip flexion 3 3+  Hip extension    Hip abduction 3+ 3+  Hip adduction 4- 4-  Hip internal rotation    Hip external rotation    Knee flexion 4- 4-  Knee extension 4 4  Ankle dorsiflexion 4 4  Ankle plantarflexion 4 4  Ankle inversion    Ankle eversion    (Blank rows = not tested) *patient with retropulsion in sitting during testing    GAIT: Gait pattern: Shuffling gait with poor foot clearance and trunk flexed over walker  Assistive device utilized: Walker - 2 wheeled Level of assistance: SBA  FUNCTIONAL TESTS:         TODAY'S TREATMENT:                                                                                                                              DATE: 03/27/23    PATIENT EDUCATION: Education details: prognosis, POC, HEP- to be performed at counter for safety Person educated: Patient and Child(ren) Education method: Explanation, Demonstration, Tactile  cues, Verbal cues, and Handouts Education comprehension: verbalized understanding and returned demonstration  HOME EXERCISE PROGRAM: Access Code: [email or web address] URL: [email or web address] Date: 03/27/2023 Prepared by: [facility] - Outpatient  Rehab - [facility]  Exercises - Sit to Stand with Counter Support  - 1 x daily - 5 x weekly - 2 sets - 10 reps - Standing March with Unilateral Counter Support  - 1 x daily - 5 x weekly - 2 sets - 10 reps - Standing Hip Abduction with Unilateral Counter Support  - 1 x daily - 5 x weekly - 2 sets - 10 reps - Standing Gastroc Stretch at Counter  - 1 x daily - 5 x weekly - 2 sets - 30 sec hold  GOALS: Goals reviewed with patient? Yes  SHORT TERM GOALS: Target date: 04/24/2023  Patient to be independent with initial HEP. Baseline: HEP initiated Goal status: MET  LONG TERM GOALS: Target date: 07/05/2023  Patient to be independent with advanced HEP. Baseline: Not yet initiated; needs cues  Goal status: IN PROGRESS 05/18/2023  Patient to complete TUG in <30 sec with LRAD in order to decrease risk of falls.   Baseline: 62 sec; (05/03/23) 34 sec; 35.37 sec 05/24/23 Goal status: IN PROGRESS 05/24/23  Patient to demonstrate 5xSTS test in <15 sec in order to decrease risk of falls.  Baseline: 30 sec; (05/03/23) 20 sec; 19.15 sec 05/18/2023; 21.26 sec 05/24/23 Goal status: IN PROGRESS 05/24/23  Patient to score at least 46/56 on Berg in order to decrease risk of falls.  Baseline: (05/03/23) 42/56; unable to complete d/t safety 05/24/23 Goal status: IN PROGRESS 05/24/23  Patient to complete 500 feet during 6 MWT with LRAD. Baseline: 335 feet >255 ft Goal status: IN PROGRESS 05/18/2023  Patient to demonstrate safe use of RW with gait, turns, transfers. Baseline: unsafe currently; cueing required for safe navigation and continuing to maintain normalized speed 05/24/23  Goal status: IN PROGRESS 05/24/23   ASSESSMENT:  CLINICAL  IMPRESSION: Patient arrived to session with report of dizziness this AM which has since resolved. Denies current dizziness, N/T, SOB. Vitals at start of session elevated, improved slightly with sitting rest break. Negative for stroke s/sx. Proceeded with session while monitoring symptoms and vitals. Patient requires increased manual and verbal cueing for proper coordination and sequencing of movements. Posterior lean apparent with more challenging activities. BP rose back up after activity- advised pt and daughter to monitor this at home and contact PCP if medication changes are warranted. Both reported understanding.  OBJECTIVE IMPAIRMENTS: Abnormal gait, decreased activity tolerance, decreased balance, difficulty walking, decreased strength, dizziness, impaired flexibility, and postural dysfunction.   ACTIVITY LIMITATIONS: carrying, lifting, bending, sitting, standing, squatting, stairs, transfers, bathing, toileting, dressing, reach over head, hygiene/grooming, and locomotion level  PARTICIPATION LIMITATIONS: meal prep, cleaning, laundry, driving, shopping, community activity, occupation, and church  PERSONAL FACTORS: Age, Behavior pattern, Past/current experiences, Time since onset of injury/illness/exacerbation, and 3+ comorbidities: COPD, COVID, HLD, HTN, lung CA s/p partial lobectomy, osteoporosis  are also affecting patient's functional outcome.   REHAB POTENTIAL: Good  CLINICAL DECISION MAKING: Evolving/moderate complexity  EVALUATION COMPLEXITY: Moderate  PLAN:  PT FREQUENCY: 2x/week  PT DURATION: 6 weeks  PLANNED INTERVENTIONS: Therapeutic exercises, Therapeutic activity, Neuromuscular re-education, Balance training, Gait training, Patient/Family education, Self Care, Joint mobilization, Stair training, Vestibular training, Canalith repositioning, Aquatic Therapy, Dry Needling, Electrical stimulation, Cryotherapy, Moist heat, Taping, Manual therapy, and Re-evaluation  PLAN FOR NEXT  SESSION: initiate standing HEP to be performed at counter for balance with daughter; trial large amplitude movements, cueing for festination with turns during walking, exercises for retropulsion; Ask about weights and HEP   Anette Guarneri, PT, DPT 05/28/23 5:09 PM  [facility]patient Rehab at [facility] [address] [place], [address] [place], [place] [address] Phone # [phone] Fax # [phone]

## 2023-05-28 ENCOUNTER — Encounter: Payer: Self-pay | Admitting: Physical Therapy

## 2023-05-28 ENCOUNTER — Ambulatory Visit: Payer: Medicare Other | Admitting: Physical Therapy

## 2023-05-28 ENCOUNTER — Ambulatory Visit: Payer: Medicare Other | Admitting: Nurse Practitioner

## 2023-05-28 DIAGNOSIS — M6281 Muscle weakness (generalized): Secondary | ICD-10-CM

## 2023-05-28 DIAGNOSIS — R2681 Unsteadiness on feet: Secondary | ICD-10-CM | POA: Diagnosis not present

## 2023-05-28 DIAGNOSIS — R2689 Other abnormalities of gait and mobility: Secondary | ICD-10-CM

## 2023-05-29 NOTE — Therapy (Signed)
OUTPATIENT PHYSICAL THERAPY NEURO TREATMENT   Patient Name: Penny [name] MRN: [id number] DOB:August 25, 1933, 89 y.o., female Today's Date: 05/30/2023   PCP: Lucky Cowboy, MD  REFERRING PROVIDER: Raynelle Dick, NP     END OF SESSION:  PT End of Session - 05/30/23 1704     Visit Number 16    Number of Visits 26    Date for PT Re-Evaluation 07/05/23    Authorization Type [employer] Medicare    Progress Note Due on Visit 20    PT Start Time 1617    PT Stop Time 1703    PT Time Calculation (min) 46 min    Equipment Utilized During Treatment Gait belt    Activity Tolerance Patient tolerated treatment well    Behavior During Therapy WFL for tasks assessed/performed                        Past Medical History:  Diagnosis Date   COPD (chronic obstructive pulmonary disease) with emphysema (HCC)    COVID-19 12/09/2019   Hyperlipidemia    Hypertension    Lung cancer, upper lobe (HCC) 12/04/1996   RUL   Osteoporosis    S/P partial lobectomy of lung    [id number]   Thigh shingles 12/04/1996   Thoracic aorta atherosclerosis (HCC)    Vitamin D deficiency    Past Surgical History:  Procedure Laterality Date   CARPAL TUNNEL RELEASE Right    2009   LUNG REMOVAL, PARTIAL Right    RUL 1998   OLECRANON BURSECTOMY Left 03/20/2017   Procedure: EXICISION OLECRANON BURSA LEFT ELBOW;  Surgeon: Cindee Salt, MD;  Location: [facility];  Service: Orthopedics;  Laterality: Left;  with scb block in preop   SHOULDER ADHESION RELEASE Right    2004   Patient Active Problem List   Diagnosis Date Noted   Rigidity (muscles) 05/21/2023   Traumatic head injury less than 3 months ago 05/21/2023   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   Malignant neoplasm of upper lobe of lung, unspecified laterality (HCC) 01/07/2023   Syncope    Symptomatic sinus bradycardia 09/10/2022   Hypokalemia 09/10/2022   Hypomagnesemia 09/10/2022   CAD (coronary artery disease) 09/10/2022    COVID-19 (11/30/2021) 12/01/2021   B12 deficiency 06/28/2020   Abnormal glucose 10/09/2018   Osteoporosis 12/28/2015   COPD (chronic obstructive pulmonary disease) with emphysema (HCC) 02/25/2015   Atherosclerosis of aorta (HCC) BY ct SCAN IN 2015 02/25/2015   Essential hypertension    Hyperlipidemia, mixed    Vitamin D deficiency    Personal history of lung cancer 12/04/1996    ONSET DATE: end of 2023  REFERRING DIAG: M60.89 (ICD-10-CM) - Other myositis of multiple sites R26.81 (ICD-10-CM) - Unsteady gait  THERAPY DIAG:  Muscle weakness (generalized)  Other abnormalities of gait and mobility  Unsteadiness on feet  Rationale for Evaluation and Treatment: Rehabilitation  SUBJECTIVE:  SUBJECTIVE STATEMENT: Feeling like my legs weigh a ton. Base of L thumb is bruised- reports that she fell last night. Daughter reports that it occurred when carrying her shoes without her walker.  Pt accompanied by: family member Daughter  PERTINENT HISTORY: COPD, COVID, HLD, HTN, lung CA s/p partial lobectomy, osteoporosis  PAIN:  Are you having pain? No   PRECAUTIONS: Fall and Other: lung CA, osteoporosis   WEIGHT BEARING RESTRICTIONS: No  FALLS: Has patient fallen in last 6 months? Yes. Number of falls a few  LIVING ENVIRONMENT: Lives with: lives with their daughter and grandson Lives in: House/apartment Stairs:  2 steps to enter with handrail; 1 story home Has following equipment at home: Single point cane, Environmental consultant - 2 wheeled, and Tour manager  PLOF: Independent; part-time job as a Science writer  PATIENT GOALS: improve muscle strength   OBJECTIVE:    TODAY'S TREATMENT: 05/30/23 Activity Comments  Vitals at start of session 94%, 108 bpm, 160/102 mmHg  sitting PWR! Up 10x Cueing to keep going  d/t limited attention   STS 5x, with green medball at chest 2x5 Cueing to look in mirror upon standing, tuck feet under, belly button forward, leaning nose towards walker for anterior wt shift. Pt requires consistent cueing and demo to maintain proper movement throughout   staggered ant/pos wt shift Using walker; looking in mirror for tall posture   lateral wt shift Pt pushing walker too far frwad and very flexed at the waist and requireing manual cues to coordinate movements   Standing balance with mirror feedback and RW support Heavy manual and verbal cues to avoid anterior trunk lean  wall bumps  Hip strategy; pt with retropulsion and difficulty staying focused       HOME EXERCISE PROGRAM Last updated: 05/28/23 Access Code: [email or web address] URL: [email or web address] Date: 05/28/2023 Prepared by: [facility] - Outpatient  Rehab - [facility]  Exercises - Sit to Stand with Counter Support  - 1 x daily - 5 x weekly - 2 sets - 10 reps - Standing March with Unilateral Counter Support  - 1 x daily - 5 x weekly - 2 sets - 10 reps - Standing Hip Abduction with Unilateral Counter Support  - 1 x daily - 5 x weekly - 2 sets - 10 reps - Standing Gastroc Stretch at Counter  - 1 x daily - 5 x weekly - 2 sets - 30 sec hold - Seated March  - 1 x daily - 5 x weekly - 2 sets - 10 reps - Seated Long Arc Quad  - 1 x daily - 5 x weekly - 2 sets - 10 reps - Seated Hip Abduction  - 1 x daily - 5 x weekly - 2 sets - 10 reps - Seated Hip Adduction Isometrics with Ball  - 1 x daily - 5 x weekly - 2 sets - 10 reps - 5 sec hold - Staggered Stance Forward Backward Weight Shift with Counter Support  - 1 x daily - 5 x weekly - 2 sets - 10 reps - Side to Side Weight Shift with Counter Support  - 1 x daily - 5 x weekly - 2 sets - 10 reps     -------------------------------------- Objective measures below taken at initial eval:  DIAGNOSTIC FINDINGS: none recent  COGNITION: Overall cognitive status:   daughter assists with subjective hx    SENSATION: Daughter reports hx of N/T in B hands from carpal tunnel syndrome  POSTURE: rounded shoulders, forward head, and increased  thoracic kyphosis  LOWER EXTREMITY ROM:     Active  Right Eval Left Eval  Hip flexion    Hip extension    Hip abduction    Hip adduction    Hip internal rotation    Hip external rotation    Knee flexion    Knee extension    Ankle dorsiflexion 15 8  Ankle plantarflexion    Ankle inversion    Ankle eversion     (Blank rows = not tested)  LOWER EXTREMITY MMT:    MMT Right Eval Left Eval  Hip flexion 3 3+  Hip extension    Hip abduction 3+ 3+  Hip adduction 4- 4-  Hip internal rotation    Hip external rotation    Knee flexion 4- 4-  Knee extension 4 4  Ankle dorsiflexion 4 4  Ankle plantarflexion 4 4  Ankle inversion    Ankle eversion    (Blank rows = not tested) *patient with retropulsion in sitting during testing    GAIT: Gait pattern: Shuffling gait with poor foot clearance and trunk flexed over walker  Assistive device utilized: Walker - 2 wheeled Level of assistance: SBA  FUNCTIONAL TESTS:         TODAY'S TREATMENT:                                                                                                                              DATE: 03/27/23    PATIENT EDUCATION: Education details: prognosis, POC, HEP- to be performed at counter for safety Person educated: Patient and Child(ren) Education method: Explanation, Demonstration, Tactile cues, Verbal cues, and Handouts Education comprehension: verbalized understanding and returned demonstration  HOME EXERCISE PROGRAM: Access Code: [email or web address] URL: [email or web address] Date: 03/27/2023 Prepared by: [facility] - Outpatient  Rehab - [facility]  Exercises - Sit to Stand with Counter Support  - 1 x daily - 5 x weekly - 2 sets - 10 reps - Standing March with Unilateral Counter Support  - 1 x daily - 5 x  weekly - 2 sets - 10 reps - Standing Hip Abduction with Unilateral Counter Support  - 1 x daily - 5 x weekly - 2 sets - 10 reps - Standing Gastroc Stretch at Counter  - 1 x daily - 5 x weekly - 2 sets - 30 sec hold  GOALS: Goals reviewed with patient? Yes  SHORT TERM GOALS: Target date: 04/24/2023  Patient to be independent with initial HEP. Baseline: HEP initiated Goal status: MET    LONG TERM GOALS: Target date: 07/05/2023  Patient to be independent with advanced HEP. Baseline: Not yet initiated; needs cues  Goal status: IN PROGRESS 05/18/2023  Patient to complete TUG in <30 sec with LRAD in order to decrease risk of falls.   Baseline: 62 sec; (05/03/23) 34 sec; 35.37 sec 05/24/23 Goal status: IN PROGRESS 05/24/23  Patient to demonstrate 5xSTS test in <15 sec in order to decrease risk of falls.  Baseline: 30 sec; (05/03/23) 20 sec; 19.15 sec 05/18/2023; 21.26 sec 05/24/23 Goal status: IN PROGRESS 05/24/23  Patient to score at least 46/56 on Berg in order to decrease risk of falls.  Baseline: (05/03/23) 42/56; unable to complete d/t safety 05/24/23 Goal status: IN PROGRESS 05/24/23  Patient to complete 500 feet during 6 MWT with LRAD. Baseline: 335 feet >255 ft Goal status: IN PROGRESS 05/18/2023  Patient to demonstrate safe use of RW with gait, turns, transfers. Baseline: unsafe currently; cueing required for safe navigation and continuing to maintain normalized speed 05/24/23  Goal status: IN PROGRESS 05/24/23   ASSESSMENT:  CLINICAL IMPRESSION:  Patient arrived to session with report of a fall last night that resulting in bruise over the L base of thumb. Worked on large amplitude movements with cueing to maintain upright sitting posture throughout. Patient did not demonstrate as much lateral trunk lean in sitting today. STS transfers required cues for set up. Patient very limited by attention/concentration during session and requires repeated demo and cues to remain on task. No  complaints at end of session.  OBJECTIVE IMPAIRMENTS: Abnormal gait, decreased activity tolerance, decreased balance, difficulty walking, decreased strength, dizziness, impaired flexibility, and postural dysfunction.   ACTIVITY LIMITATIONS: carrying, lifting, bending, sitting, standing, squatting, stairs, transfers, bathing, toileting, dressing, reach over head, hygiene/grooming, and locomotion level  PARTICIPATION LIMITATIONS: meal prep, cleaning, laundry, driving, shopping, community activity, occupation, and church  PERSONAL FACTORS: Age, Behavior pattern, Past/current experiences, Time since onset of injury/illness/exacerbation, and 3+ comorbidities: COPD, COVID, HLD, HTN, lung CA s/p partial lobectomy, osteoporosis  are also affecting patient's functional outcome.   REHAB POTENTIAL: Good  CLINICAL DECISION MAKING: Evolving/moderate complexity  EVALUATION COMPLEXITY: Moderate  PLAN:  PT FREQUENCY: 2x/week  PT DURATION: 6 weeks  PLANNED INTERVENTIONS: Therapeutic exercises, Therapeutic activity, Neuromuscular re-education, Balance training, Gait training, Patient/Family education, Self Care, Joint mobilization, Stair training, Vestibular training, Canalith repositioning, Aquatic Therapy, Dry Needling, Electrical stimulation, Cryotherapy, Moist heat, Taping, Manual therapy, and Re-evaluation  PLAN FOR NEXT SESSION continue adding to standing HEP to be performed at counter for balance with daughter; trial large amplitude movements, cueing for festination with turns during walking, exercises for retropulsion; Ask about weights and HEP   Anette Guarneri, PT, DPT 05/30/23 5:05 PM  [facility] Outpatient Rehab at [facility] [address] [place], [address] [place], [place] [address] Phone # [phone] Fax # [phone]

## 2023-05-30 ENCOUNTER — Encounter: Payer: Self-pay | Admitting: Physical Therapy

## 2023-05-30 ENCOUNTER — Ambulatory Visit: Payer: Medicare Other | Admitting: Physical Therapy

## 2023-05-30 DIAGNOSIS — R2681 Unsteadiness on feet: Secondary | ICD-10-CM | POA: Diagnosis not present

## 2023-05-30 DIAGNOSIS — R2689 Other abnormalities of gait and mobility: Secondary | ICD-10-CM | POA: Diagnosis not present

## 2023-05-30 DIAGNOSIS — M6281 Muscle weakness (generalized): Secondary | ICD-10-CM

## 2023-06-01 ENCOUNTER — Emergency Department (HOSPITAL_COMMUNITY): Payer: Medicare Other

## 2023-06-01 ENCOUNTER — Other Ambulatory Visit: Payer: Self-pay

## 2023-06-01 ENCOUNTER — Observation Stay (HOSPITAL_COMMUNITY): Payer: Medicare Other

## 2023-06-01 ENCOUNTER — Observation Stay (HOSPITAL_COMMUNITY)
Admission: EM | Admit: 2023-06-01 | Discharge: 2023-06-07 | Disposition: A | Discharge status: Skilled Nursing Facility | Payer: Medicare Other | Attending: Internal Medicine | Admitting: Internal Medicine

## 2023-06-01 ENCOUNTER — Encounter (HOSPITAL_COMMUNITY): Payer: Self-pay | Admitting: Internal Medicine

## 2023-06-01 DIAGNOSIS — Z8709 Personal history of other diseases of the respiratory system: Secondary | ICD-10-CM

## 2023-06-01 DIAGNOSIS — S0636AA Traumatic hemorrhage of cerebrum, unspecified, with loss of consciousness status unknown, initial encounter: Secondary | ICD-10-CM | POA: Diagnosis not present

## 2023-06-01 DIAGNOSIS — J301 Allergic rhinitis due to pollen: Secondary | ICD-10-CM | POA: Diagnosis not present

## 2023-06-01 DIAGNOSIS — M160 Bilateral primary osteoarthritis of hip: Secondary | ICD-10-CM | POA: Diagnosis not present

## 2023-06-01 DIAGNOSIS — M11262 Other chondrocalcinosis, left knee: Secondary | ICD-10-CM | POA: Diagnosis not present

## 2023-06-01 DIAGNOSIS — F32A Depression, unspecified: Secondary | ICD-10-CM | POA: Diagnosis present

## 2023-06-01 DIAGNOSIS — J449 Chronic obstructive pulmonary disease, unspecified: Secondary | ICD-10-CM | POA: Diagnosis not present

## 2023-06-01 DIAGNOSIS — S06369A Traumatic hemorrhage of cerebrum, unspecified, with loss of consciousness of unspecified duration, initial encounter: Secondary | ICD-10-CM | POA: Diagnosis not present

## 2023-06-01 DIAGNOSIS — W19XXXA Unspecified fall, initial encounter: Secondary | ICD-10-CM | POA: Diagnosis not present

## 2023-06-01 DIAGNOSIS — R609 Edema, unspecified: Secondary | ICD-10-CM | POA: Diagnosis not present

## 2023-06-01 DIAGNOSIS — I619 Nontraumatic intracerebral hemorrhage, unspecified: Secondary | ICD-10-CM | POA: Diagnosis not present

## 2023-06-01 DIAGNOSIS — M19012 Primary osteoarthritis, left shoulder: Secondary | ICD-10-CM | POA: Diagnosis not present

## 2023-06-01 DIAGNOSIS — J309 Allergic rhinitis, unspecified: Secondary | ICD-10-CM | POA: Diagnosis present

## 2023-06-01 DIAGNOSIS — Z79899 Other long term (current) drug therapy: Secondary | ICD-10-CM | POA: Diagnosis not present

## 2023-06-01 DIAGNOSIS — Y92009 Unspecified place in unspecified non-institutional (private) residence as the place of occurrence of the external cause: Secondary | ICD-10-CM | POA: Diagnosis not present

## 2023-06-01 DIAGNOSIS — M19032 Primary osteoarthritis, left wrist: Secondary | ICD-10-CM | POA: Diagnosis not present

## 2023-06-01 DIAGNOSIS — S0633AA Contusion and laceration of cerebrum, unspecified, with loss of consciousness status unknown, initial encounter: Secondary | ICD-10-CM

## 2023-06-01 DIAGNOSIS — R9089 Other abnormal findings on diagnostic imaging of central nervous system: Secondary | ICD-10-CM | POA: Diagnosis not present

## 2023-06-01 DIAGNOSIS — E876 Hypokalemia: Secondary | ICD-10-CM | POA: Diagnosis not present

## 2023-06-01 DIAGNOSIS — Z7982 Long term (current) use of aspirin: Secondary | ICD-10-CM | POA: Insufficient documentation

## 2023-06-01 DIAGNOSIS — W01198A Fall on same level from slipping, tripping and stumbling with subsequent striking against other object, initial encounter: Secondary | ICD-10-CM | POA: Insufficient documentation

## 2023-06-01 DIAGNOSIS — S42202A Unspecified fracture of upper end of left humerus, initial encounter for closed fracture: Secondary | ICD-10-CM | POA: Diagnosis present

## 2023-06-01 DIAGNOSIS — S2241XA Multiple fractures of ribs, right side, initial encounter for closed fracture: Secondary | ICD-10-CM | POA: Diagnosis not present

## 2023-06-01 DIAGNOSIS — S42302A Unspecified fracture of shaft of humerus, left arm, initial encounter for closed fracture: Secondary | ICD-10-CM | POA: Diagnosis not present

## 2023-06-01 DIAGNOSIS — S0631AA Contusion and laceration of right cerebrum with loss of consciousness status unknown, initial encounter: Secondary | ICD-10-CM

## 2023-06-01 DIAGNOSIS — I629 Nontraumatic intracranial hemorrhage, unspecified: Secondary | ICD-10-CM

## 2023-06-01 DIAGNOSIS — Z85118 Personal history of other malignant neoplasm of bronchus and lung: Secondary | ICD-10-CM | POA: Insufficient documentation

## 2023-06-01 DIAGNOSIS — S199XXA Unspecified injury of neck, initial encounter: Secondary | ICD-10-CM | POA: Diagnosis not present

## 2023-06-01 DIAGNOSIS — Z87891 Personal history of nicotine dependence: Secondary | ICD-10-CM | POA: Insufficient documentation

## 2023-06-01 DIAGNOSIS — I611 Nontraumatic intracerebral hemorrhage in hemisphere, cortical: Secondary | ICD-10-CM | POA: Diagnosis not present

## 2023-06-01 DIAGNOSIS — Z8616 Personal history of COVID-19: Secondary | ICD-10-CM | POA: Diagnosis not present

## 2023-06-01 DIAGNOSIS — M79602 Pain in left arm: Secondary | ICD-10-CM | POA: Diagnosis present

## 2023-06-01 DIAGNOSIS — R000 Tachycardia, unspecified: Secondary | ICD-10-CM | POA: Diagnosis not present

## 2023-06-01 DIAGNOSIS — S0990XA Unspecified injury of head, initial encounter: Secondary | ICD-10-CM | POA: Diagnosis not present

## 2023-06-01 LAB — CBC WITH DIFFERENTIAL/PLATELET
Abs Immature Granulocytes: 0.04 10*3/uL (ref 0.00–0.07)
Abs Immature Granulocytes: 0.1 10*3/uL — ABNORMAL HIGH (ref 0.00–0.07)
Basophils Absolute: 0 10*3/uL (ref 0.0–0.1)
Basophils Absolute: 0 10*3/uL (ref 0.0–0.1)
Basophils Relative: 0 %
Basophils Relative: 0 %
Eosinophils Absolute: 0 10*3/uL (ref 0.0–0.5)
Eosinophils Absolute: 0 10*3/uL (ref 0.0–0.5)
Eosinophils Relative: 0 %
Eosinophils Relative: 0 %
HCT: 38.1 % (ref 36.0–46.0)
HCT: 36.8 % (ref 36.0–46.0)
Hemoglobin: 11.9 g/dL — ABNORMAL LOW (ref 12.0–15.0)
Hemoglobin: 11.6 g/dL — ABNORMAL LOW (ref 12.0–15.0)
Immature Granulocytes: 0 %
Immature Granulocytes: 2 %
Lymphocytes Relative: 8 %
Lymphocytes Relative: 15 %
Lymphs Abs: 0.8 10*3/uL (ref 0.7–4.0)
Lymphs Abs: 1 10*3/uL (ref 0.7–4.0)
MCH: 27.3 pg (ref 26.0–34.0)
MCH: 27.5 pg (ref 26.0–34.0)
MCHC: 31.2 g/dL (ref 30.0–36.0)
MCHC: 31.5 g/dL (ref 30.0–36.0)
MCV: 87.4 fL (ref 80.0–100.0)
MCV: 87.2 fL (ref 80.0–100.0)
Monocytes Absolute: 0.7 10*3/uL (ref 0.1–1.0)
Monocytes Absolute: 0.8 10*3/uL (ref 0.1–1.0)
Monocytes Relative: 7 %
Monocytes Relative: 11 %
Neutro Abs: 8.5 10*3/uL — ABNORMAL HIGH (ref 1.7–7.7)
Neutro Abs: 4.8 10*3/uL (ref 1.7–7.7)
Neutrophils Relative %: 85 %
Neutrophils Relative %: 72 %
Platelets: 183 10*3/uL (ref 150–400)
Platelets: 194 10*3/uL (ref 150–400)
RBC: 4.36 MIL/uL (ref 3.87–5.11)
RBC: 4.22 MIL/uL (ref 3.87–5.11)
RDW: 13.2 % (ref 11.5–15.5)
RDW: 13.4 % (ref 11.5–15.5)
WBC: 10 10*3/uL (ref 4.0–10.5)
WBC: 6.7 10*3/uL (ref 4.0–10.5)
nRBC: 0 % (ref 0.0–0.2)
nRBC: 0 % (ref 0.0–0.2)

## 2023-06-01 LAB — URINALYSIS, COMPLETE (UACMP) WITH MICROSCOPIC
Bacteria, UA: NONE SEEN
Bilirubin Urine: NEGATIVE
Glucose, UA: NEGATIVE mg/dL
Hgb urine dipstick: NEGATIVE
Ketones, ur: NEGATIVE mg/dL
Leukocytes,Ua: NEGATIVE
Nitrite: NEGATIVE
Protein, ur: NEGATIVE mg/dL
Specific Gravity, Urine: 1.009 (ref 1.005–1.030)
pH: 7 (ref 5.0–8.0)

## 2023-06-01 LAB — COMPREHENSIVE METABOLIC PANEL
ALT: 15 U/L (ref 0–44)
AST: 24 U/L (ref 15–41)
Albumin: 3.6 g/dL (ref 3.5–5.0)
Alkaline Phosphatase: 60 U/L (ref 38–126)
Anion gap: 8 (ref 5–15)
BUN: 11 mg/dL (ref 8–23)
CO2: 25 mmol/L (ref 22–32)
Calcium: 8.7 mg/dL — ABNORMAL LOW (ref 8.9–10.3)
Chloride: 106 mmol/L (ref 98–111)
Creatinine, Ser: 0.62 mg/dL (ref 0.44–1.00)
GFR, Estimated: >60 mL/min (ref >60)
Glucose, Bld: 130 mg/dL — ABNORMAL HIGH (ref 70–99)
Potassium: 3.7 mmol/L (ref 3.5–5.1)
Sodium: 139 mmol/L (ref 135–145)
Total Bilirubin: 0.8 mg/dL (ref 0.3–1.2)
Total Protein: 6.4 g/dL — ABNORMAL LOW (ref 6.5–8.1)

## 2023-06-01 LAB — BASIC METABOLIC PANEL
Anion gap: 9 (ref 5–15)
BUN: 13 mg/dL (ref 8–23)
CO2: 24 mmol/L (ref 22–32)
Calcium: 9.1 mg/dL (ref 8.9–10.3)
Chloride: 104 mmol/L (ref 98–111)
Creatinine, Ser: 0.71 mg/dL (ref 0.44–1.00)
GFR, Estimated: >60 mL/min (ref >60)
Glucose, Bld: 172 mg/dL — ABNORMAL HIGH (ref 70–99)
Potassium: 2.9 mmol/L — ABNORMAL LOW (ref 3.5–5.1)
Sodium: 137 mmol/L (ref 135–145)

## 2023-06-01 LAB — CK: Total CK: 224 U/L (ref 38–234)

## 2023-06-01 LAB — PROTIME-INR
INR: 1 (ref 0.8–1.2)
Prothrombin Time: 13 seconds (ref 11.4–15.2)

## 2023-06-01 LAB — APTT: aPTT: 25 seconds (ref 24–36)

## 2023-06-01 LAB — TROPONIN I (HIGH SENSITIVITY)
Troponin I (High Sensitivity): 12 ng/L (ref <18)
Troponin I (High Sensitivity): 11 ng/L (ref <18)

## 2023-06-01 LAB — MAGNESIUM: Magnesium: 1.5 mg/dL — ABNORMAL LOW (ref 1.7–2.4)

## 2023-06-01 MED ORDER — ACETAMINOPHEN 650 MG RE SUPP: 650.0000 mg | Freq: Four times a day (QID) | RECTAL | Status: DC | PRN

## 2023-06-01 MED ORDER — BUPROPION HCL 75 MG PO TABS
75.0000 mg | ORAL_TABLET | Freq: Every day | ORAL | Status: DC
  Administered 2023-06-01 – 2023-06-07 (×7): 75 mg via ORAL
  Filled 2023-06-01 (×7): qty 1

## 2023-06-01 MED ORDER — MOMETASONE FURO-FORMOTEROL FUM 100-5 MCG/ACT IN AERO
2.0000 | INHALATION_SPRAY | Freq: Two times a day (BID) | RESPIRATORY_TRACT | Status: DC
  Administered 2023-06-02 – 2023-06-07 (×11): 2 via RESPIRATORY_TRACT
  Filled 2023-06-01: qty 8.8

## 2023-06-01 MED ORDER — ACETAMINOPHEN 325 MG PO TABS
650.0000 mg | ORAL_TABLET | Freq: Four times a day (QID) | ORAL | Status: DC | PRN
  Administered 2023-06-01 – 2023-06-06 (×7): 650 mg via ORAL
  Filled 2023-06-01 (×7): qty 2

## 2023-06-01 MED ORDER — POTASSIUM CHLORIDE CRYS ER 20 MEQ PO TBCR
40.0000 meq | EXTENDED_RELEASE_TABLET | Freq: Once | ORAL | Status: AC
  Administered 2023-06-01: 40 meq via ORAL
  Filled 2023-06-01: qty 2

## 2023-06-01 MED ORDER — POLYETHYLENE GLYCOL 3350 17 G PO PACK
17.0000 g | PACK | Freq: Two times a day (BID) | ORAL | Status: AC
  Administered 2023-06-01 (×2): 17 g via ORAL
  Filled 2023-06-01 (×4): qty 1

## 2023-06-01 MED ORDER — HYDROCODONE-ACETAMINOPHEN 5-325 MG PO TABS
1.0000 | ORAL_TABLET | Freq: Four times a day (QID) | ORAL | Status: DC | PRN
  Administered 2023-06-01 – 2023-06-07 (×6): 1 via ORAL
  Filled 2023-06-01 (×6): qty 1

## 2023-06-01 MED ORDER — UMECLIDINIUM BROMIDE 62.5 MCG/ACT IN AEPB
1.0000 | INHALATION_SPRAY | Freq: Every day | RESPIRATORY_TRACT | Status: DC
  Administered 2023-06-02 – 2023-06-07 (×6): 1 via RESPIRATORY_TRACT
  Filled 2023-06-01: qty 7

## 2023-06-01 MED ORDER — BUDESON-GLYCOPYRROL-FORMOTEROL 160-9-4.8 MCG/ACT IN AERO: 1.0000 | INHALATION_SPRAY | Freq: Every day | RESPIRATORY_TRACT | Status: DC

## 2023-06-01 MED ORDER — LABETALOL HCL 5 MG/ML IV SOLN: 10.0000 mg | INTRAVENOUS | Status: DC | PRN

## 2023-06-01 MED ORDER — ALBUTEROL SULFATE (2.5 MG/3ML) 0.083% IN NEBU: 2.5000 mg | INHALATION_SOLUTION | RESPIRATORY_TRACT | Status: DC | PRN

## 2023-06-01 MED ORDER — MONTELUKAST SODIUM 10 MG PO TABS
10.0000 mg | ORAL_TABLET | Freq: Every day | ORAL | Status: DC
  Administered 2023-06-01 – 2023-06-06 (×6): 10 mg via ORAL
  Filled 2023-06-01 (×6): qty 1

## 2023-06-01 NOTE — ED Notes (Signed)
Ortho tech called to apply coaptation long arm splint.

## 2023-06-01 NOTE — ED Triage Notes (Signed)
Patient BIB EMS from home c/o unwitnessed fall. Per report patient was found by grandson in the floor. Pt report she tripped and fell on her left side. Pt c/o of left arm pain. Patient denies LOC. Pt not taking blood thinners. Pt a/ox4.

## 2023-06-01 NOTE — ED Provider Notes (Signed)
[name] [facility] AT [facility] Provider Note   CSN: [id number] Arrival date & time: 06/01/23  0215     History  Chief Complaint  Patient presents with   Penny [name] is a 89 y.o. female.  Patient with a history of hypertension, hyperlipidemia, osteoporosis, previous lung cancer presenting with fall and arm injury.  States she was walking with her walker when her "legs gave out" and she fell onto her left side.  Does think she hit her head.  Was on the ground for approximately 2 hours before family found her.  Complains of pain to her left upper arm and elbow area.  Also left knee pain.  Denies headache currently.  No neck or back pain.  No chest pain or abdominal pain.  No preceding dizziness or lightheadedness.  No syncope. Denies blood thinner use.   Fall was not witnessed.   The history is provided by the patient, the EMS personnel and a relative.  Fall Pertinent negatives include no abdominal pain, no headaches and no shortness of breath.       Home Medications Prior to Admission medications   Medication Sig Start Date End Date Taking? Authorizing Provider  aspirin 81 MG chewable tablet Chew 81 mg by mouth every evening.    [provider]  Budeson-Glycopyrrol-Formoterol (BREZTRI AEROSPHERE) 160-9-4.8 MCG/ACT AERO Inhale 1 puff into the lungs in the morning and at bedtime. 03/21/23   Raynelle Dick, NP  buPROPion [facility]) 75 MG tablet One tablet once a day 05/14/23   Raynelle Dick, NP  cetirizine (ZYRTEC) 10 MG tablet Takes 1 tablet Daily Patient taking differently: Take 10 mg by mouth every evening. 05/02/19   Lucky Cowboy, MD  Cholecalciferol 125 MCG (5000 UT) capsule Take 1 capsules Daily Patient not taking: Reported on 03/21/2023 12/01/21   Judd Gaudier, NP  Cyanocobalamin (VITAMIN B-12) 1000 MCG SUBL Takes 1 tab SL Daily Patient not taking: Reported on 03/21/2023 05/02/19   Lucky Cowboy, MD   montelukast (SINGULAIR) 10 MG tablet Take  1 tablet  Daily for Allergies                                                            /                            TAKE                      BY                     MOUTH 05/08/23   Lucky Cowboy, MD  simvastatin (ZOCOR) 40 MG tablet Take  1 tablet  every Night for Cholesterol                                                         /  TAKE                                                    BY                                                      MOUTH Patient not taking: Reported on 05/14/2023 05/08/23   Lucky Cowboy, MD      Allergies    Ace inhibitors, Calcium-containing compounds, Lactose intolerance (gi), and Polysporin [bacitracin-polymyxin b]    Review of Systems   Review of Systems  Constitutional:  Negative for activity change, appetite change and fever.  HENT:  Negative for congestion.   Respiratory:  Negative for cough, chest tightness and shortness of breath.   Gastrointestinal:  Negative for abdominal pain, nausea and vomiting.  Genitourinary:  Negative for dysuria.  Musculoskeletal:  Positive for arthralgias and myalgias. Negative for back pain and neck pain.  Skin:  Negative for rash.  Neurological:  Negative for weakness and headaches.   all other systems are negative except as noted in the HPI and PMH.    Physical Exam Updated Vital Signs BP (!) 163/87 (BP Location: Right Arm)   Pulse (!) 105   Temp (!) 97.3 F (36.3 C) (Oral)   SpO2 91%  Physical Exam Vitals and nursing note reviewed.  Constitutional:      General: She is not in acute distress.    Appearance: She is well-developed.  HENT:     Head: Normocephalic and atraumatic.     Comments: Old appearing ecchymosis right forehead    Mouth/Throat:     Pharynx: No oropharyngeal exudate.  Eyes:     Conjunctiva/sclera: Conjunctivae normal.     Pupils: Pupils are equal, round, and reactive to light.  Neck:     Comments:  No midline C-spine tenderness Cardiovascular:     Rate and Rhythm: Normal rate and regular rhythm.     Heart sounds: Normal heart sounds. No murmur heard. Pulmonary:     Effort: Pulmonary effort is normal. No respiratory distress.     Breath sounds: Normal breath sounds.  Chest:     Chest wall: No tenderness.  Abdominal:     Palpations: Abdomen is soft.     Tenderness: There is no abdominal tenderness. There is no guarding or rebound.  Musculoskeletal:        General: Tenderness and signs of injury present.     Cervical back: Normal range of motion and neck supple.     Comments: Tenderness and swelling to mid left upper arm.  No breaks in skin.  No tenderness to palpation of left elbow or shoulder.  Intact radial pulse.  Ecchymosis left knee without bony tenderness.  Full range of motion hips bilaterally.  No T or L-spine tenderness  Skin:    General: Skin is warm.  Neurological:     Mental Status: She is alert and oriented to person, place, and time.     Cranial Nerves: No cranial nerve deficit.     Motor: No abnormal muscle tone.     Coordination: Coordination normal.     Comments:  5/5 strength throughout. CN 2-12 intact.Equal grip strength.  Psychiatric:        Behavior: Behavior normal.     ED Results / Procedures / Treatments   Labs (all labs ordered are listed, but only abnormal results are displayed) Labs Reviewed  CBC WITH DIFFERENTIAL/PLATELET - Abnormal; Notable for the following components:      Result Value   Hemoglobin 11.9 (*)    Neutro Abs 8.5 (*)    All other components within normal limits  BASIC METABOLIC PANEL - Abnormal; Notable for the following components:   Potassium 2.9 (*)    Glucose, Bld 172 (*)    All other components within normal limits  MAGNESIUM - Abnormal; Notable for the following components:   Magnesium 1.5 (*)    All other components within normal limits  CK  COMPREHENSIVE METABOLIC PANEL  CBC WITH DIFFERENTIAL/PLATELET   PROTIME-INR  APTT  URINALYSIS, COMPLETE (UACMP) WITH MICROSCOPIC  TROPONIN I (HIGH SENSITIVITY)  TROPONIN I (HIGH SENSITIVITY)    EKG EKG Interpretation Date/Time:  Friday June 01 2023 02:28:08 EDT Ventricular Rate:  109 PR Interval:  154 QRS Duration:  85 QT Interval:  343 QTC Calculation: 462 R Axis:   -1  Text Interpretation: Sinus tachycardia Probable left atrial enlargement Abnormal R-wave progression, early transition Rate faster Confirmed by Glynn Octave [phone]) on 06/01/2023 2:45:33 AM  Radiology CT Head Wo Contrast  Result Date: 06/01/2023 CLINICAL DATA:  Head trauma, moderate-severe; Neck trauma (Age >= 65y), Unwitnessed fall EXAM: CT HEAD WITHOUT CONTRAST CT CERVICAL SPINE WITHOUT CONTRAST TECHNIQUE: Multidetector CT imaging of the head and cervical spine was performed following the standard protocol without intravenous contrast. Multiplanar CT image reconstructions of the cervical spine were also generated. RADIATION DOSE REDUCTION: This exam was performed according to the departmental dose-optimization program which includes automated exposure control, adjustment of the mA and/or kV according to patient size and/or use of iterative reconstruction technique. COMPARISON:  11/13/2022 FINDINGS: CT HEAD FINDINGS Brain: A punctate focus of acute intraparenchymal hemorrhage is seen within the right frontal cortex measuring 3 mm in diameter at axial image # 27/3. No associated abnormal mass effect. No midline shift. Mild parenchymal volume loss is commensurate with the patient's age. Extensive periventricular and subcortical white matter changes are again identified, stable since prior examination, possibly reflecting sequela of [place] vessel ischemia though demyelinating disease or prior radiation therapy could appear similarly. No acute infarct. No abnormal intra or extra-axial mass lesion. Ventricular size is normal. Cerebellum is unremarkable. Vascular: No hyperdense vessel or  unexpected calcification. Skull: Normal. Negative for fracture or focal lesion. Sinuses/Orbits: No acute finding. Other: Mastoid air cells and middle ear cavities are clear. CT CERVICAL SPINE FINDINGS Alignment: There is straightening of the cervical spine. Stable 2 mm anterolisthesis C3-4 and 2 mm retrolisthesis C6-7. No acute traumatic listhesis identified. Skull base and vertebrae: Craniocervical alignment is normal. The atlantodental interval is not widened. No acute fracture of the cervical spine. Vertebral body height is preserved. Soft tissues and spinal canal: No prevertebral fluid or swelling. No visible canal hematoma. Disc levels: There is intervertebral disc space narrowing, endplate remodeling, and disc calcification throughout the cervical spine in keeping with changes of diffuse advanced degenerative disc disease. Multilevel uncovertebral and facet arthrosis results in multilevel moderate to severe neuroforaminal narrowing, most severe on the left at C4-5. Prevertebral soft tissues are not thickened on sagittal reformats. No high-grade canal stenosis. Upper chest: Negative. Other: None IMPRESSION: 1. Punctate focus of acute intraparenchymal hemorrhage within the right frontal cortex. No associated abnormal mass effect.  No midline shift. 2. No acute fracture or listhesis of the cervical spine. 3. Advanced degenerative disc and degenerative joint disease resulting in multilevel moderate to severe neuroforaminal narrowing, most severe on the left at C4-5. These results were called by telephone at the time of interpretation on 06/01/2023 at 3:50 am to provider [facility] , who verbally acknowledged these results. Electronically Signed   By: Helyn Numbers M.D.   On: 06/01/2023 03:50   CT Cervical Spine Wo Contrast  Result Date: 06/01/2023 CLINICAL DATA:  Head trauma, moderate-severe; Neck trauma (Age >= 65y), Unwitnessed fall EXAM: CT HEAD WITHOUT CONTRAST CT CERVICAL SPINE WITHOUT CONTRAST  TECHNIQUE: Multidetector CT imaging of the head and cervical spine was performed following the standard protocol without intravenous contrast. Multiplanar CT image reconstructions of the cervical spine were also generated. RADIATION DOSE REDUCTION: This exam was performed according to the departmental dose-optimization program which includes automated exposure control, adjustment of the mA and/or kV according to patient size and/or use of iterative reconstruction technique. COMPARISON:  11/13/2022 FINDINGS: CT HEAD FINDINGS Brain: A punctate focus of acute intraparenchymal hemorrhage is seen within the right frontal cortex measuring 3 mm in diameter at axial image # 27/3. No associated abnormal mass effect. No midline shift. Mild parenchymal volume loss is commensurate with the patient's age. Extensive periventricular and subcortical white matter changes are again identified, stable since prior examination, possibly reflecting sequela of [place] vessel ischemia though demyelinating disease or prior radiation therapy could appear similarly. No acute infarct. No abnormal intra or extra-axial mass lesion. Ventricular size is normal. Cerebellum is unremarkable. Vascular: No hyperdense vessel or unexpected calcification. Skull: Normal. Negative for fracture or focal lesion. Sinuses/Orbits: No acute finding. Other: Mastoid air cells and middle ear cavities are clear. CT CERVICAL SPINE FINDINGS Alignment: There is straightening of the cervical spine. Stable 2 mm anterolisthesis C3-4 and 2 mm retrolisthesis C6-7. No acute traumatic listhesis identified. Skull base and vertebrae: Craniocervical alignment is normal. The atlantodental interval is not widened. No acute fracture of the cervical spine. Vertebral body height is preserved. Soft tissues and spinal canal: No prevertebral fluid or swelling. No visible canal hematoma. Disc levels: There is intervertebral disc space narrowing, endplate remodeling, and disc calcification  throughout the cervical spine in keeping with changes of diffuse advanced degenerative disc disease. Multilevel uncovertebral and facet arthrosis results in multilevel moderate to severe neuroforaminal narrowing, most severe on the left at C4-5. Prevertebral soft tissues are not thickened on sagittal reformats. No high-grade canal stenosis. Upper chest: Negative. Other: None IMPRESSION: 1. Punctate focus of acute intraparenchymal hemorrhage within the right frontal cortex. No associated abnormal mass effect. No midline shift. 2. No acute fracture or listhesis of the cervical spine. 3. Advanced degenerative disc and degenerative joint disease resulting in multilevel moderate to severe neuroforaminal narrowing, most severe on the left at C4-5. These results were called by telephone at the time of interpretation on 06/01/2023 at 3:50 am to provider [facility] , who verbally acknowledged these results. Electronically Signed   By: Helyn Numbers M.D.   On: 06/01/2023 03:50   DG Pelvis 1-2 Views  Result Date: 06/01/2023 CLINICAL DATA:  Status post fall. EXAM: PELVIS - 1-2 VIEW COMPARISON:  None Available. FINDINGS: There is no evidence of an acute pelvic fracture or diastasis. No pelvic bone lesions are seen. Moderate severity degenerative changes seen involving both hips, in the form of joint space narrowing and acetabular sclerosis. IMPRESSION: Moderate severity degenerative changes involving both hips. Electronically Signed  By: Aram Candela M.D.   On: 06/01/2023 03:45   DG Knee Complete 4 Views Left  Result Date: 06/01/2023 CLINICAL DATA:  Status post fall. EXAM: LEFT KNEE - COMPLETE 4+ VIEW COMPARISON:  None Available. FINDINGS: No evidence of fracture, dislocation, or joint effusion. Marked severity medial and lateral chondrocalcinosis is seen. Soft tissues are unremarkable. IMPRESSION: Marked severity chondrocalcinosis. Electronically Signed   By: Aram Candela M.D.   On: 06/01/2023 03:44    DG Wrist Complete Left  Result Date: 06/01/2023 CLINICAL DATA:  Status post fall. EXAM: LEFT WRIST - COMPLETE 3+ VIEW COMPARISON:  None Available. FINDINGS: There is no evidence of an acute fracture or dislocation. Marked severity degenerative changes are seen along the distal aspect of the left wrist, most prominent along the first carpometacarpal joint. Soft tissues are unremarkable. IMPRESSION: Marked severity degenerative changes without an acute fracture or dislocation. Electronically Signed   By: Aram Candela M.D.   On: 06/01/2023 03:43   DG Elbow Complete Left  Result Date: 06/01/2023 CLINICAL DATA:  Status post fall. EXAM: LEFT ELBOW - COMPLETE 3+ VIEW COMPARISON:  None Available. FINDINGS: There is an acute fracture deformity involving the visualized portion of the proximal left humeral shaft. There is no evidence of dislocation. There is no evidence of arthropathy or other focal bone abnormality. Soft tissues are unremarkable. IMPRESSION: Acute fracture of the proximal left humeral shaft. Electronically Signed   By: Aram Candela M.D.   On: 06/01/2023 03:42   DG Shoulder Left  Result Date: 06/01/2023 CLINICAL DATA:  Status post fall. EXAM: LEFT SHOULDER - 2+ VIEW COMPARISON:  None Available. FINDINGS: There is an acute fracture deformity involving the proximal left humeral shaft. There is no evidence of dislocation. Degenerative changes are seen involving the left acromioclavicular joint. Soft tissue swelling is seen surrounding the previously noted fracture site. IMPRESSION: Acute fracture of the proximal left humeral shaft. Electronically Signed   By: Aram Candela M.D.   On: 06/01/2023 03:41   DG Humerus Left  Result Date: 06/01/2023 CLINICAL DATA:  Status post fall. EXAM: LEFT HUMERUS - 2+ VIEW COMPARISON:  None Available. FINDINGS: There is an acute fracture of the proximal left humeral shaft. Approximately 1-2 shaft width medial displacement of the distal fracture site  is seen. There is no evidence of dislocation. Moderate severity soft tissue swelling is seen along the previously noted fracture site. IMPRESSION: Acute fracture of the proximal left humeral shaft. Electronically Signed   By: Aram Candela M.D.   On: 06/01/2023 03:40   DG Chest 2 View  Result Date: 06/01/2023 CLINICAL DATA:  Status post fall. EXAM: CHEST - 2 VIEW COMPARISON:  November 13, 2022 FINDINGS: The lateral views limited in evaluation secondary to positioning of the patient's left upper extremity. The heart size and mediastinal contours are within normal limits. Both lungs are clear. An acute, mildly displaced fracture deformity is seen involving the proximal left humeral shaft. Multiple chronic right-sided rib fractures are noted. IMPRESSION: 1. No active cardiopulmonary disease. 2. Acute, mildly displaced fracture of the proximal left humeral shaft. Electronically Signed   By: Aram Candela M.D.   On: 06/01/2023 03:39    Procedures .Critical Care  Performed by: Glynn Octave, MD Authorized by: Glynn Octave, MD   Critical care provider statement:    Critical care time (minutes):  33   Critical care time was exclusive of:  Separately billable procedures and treating other patients   Critical care was necessary to treat or  prevent imminent or life-threatening deterioration of the following conditions:  Trauma   Critical care was time spent personally by me on the following activities:  Development of treatment plan with patient or surrogate, discussions with consultants, evaluation of patient's response to treatment, examination of patient, ordering and review of laboratory studies, ordering and review of radiographic studies, ordering and performing treatments and interventions, pulse oximetry, re-evaluation of patient's condition, review of old charts, blood draw for specimens and obtaining history from patient or surrogate   I assumed direction of critical care for this  patient from another provider in my specialty: no     Care discussed with: admitting provider       Medications Ordered in ED Medications - No data to display  ED Course/ Medical Decision Making/ A&P                             Medical Decision Making Amount and/or Complexity of Data Reviewed Independent Historian: EMS Labs: ordered. Decision-making details documented in ED Course. Radiology: ordered and independent interpretation performed. Decision-making details documented in ED Course. ECG/medicine tests: ordered and independent interpretation performed. Decision-making details documented in ED Course.  Risk Prescription drug management. Decision regarding hospitalization.   Follow-up left arm injury and possible head injury.  No loss of consciousness.  No blood thinner use.  Concern for humerus fracture.  Patient does have a head injury as well as areas of ecchymosis across her forehead.  Denies any blood thinner use.  Imaging remarkable for midshaft humerus fracture on the left.  Neurovascular intact.  Will place in sling and shoulder immobilizer. CT head does show areas of intraparenchymal hemorrhage of uncertain etiology, thought to be likely posttraumatic.  Discussed with radiology Dr. Ramiro Harvest.   Labs remarkable for mild hypokalemia which is replaced.  Discussed with Dr. Conchita Paris of neurosurgery.  No need to transfer to [facility].  Recommends repeat head CT in 8 to 12 hours.  If stable can she be discharged from a neurosurgical standpoint  Humerus fracture discussed with Dr. Odis Hollingshead orthopedics.  He recommends coaptation splint versus sling and follow-up with Dr. Aundria Rud. Patient remains neurovascularly intact.  Plan admission for patient is intraparenchymal hemorrhage and repeat head CT.  Suspect this is likely traumatic in origin rather than something from a CVA.  Admission discussed with Dr. Arlean Hopping.        Final Clinical Impression(s) / ED  Diagnoses Final diagnoses:  Fall, initial encounter  Closed fracture of shaft of left humerus, unspecified fracture morphology, initial encounter  Intraparenchymal hematoma of brain, right, with unknown loss of consciousness status, initial encounter [facility])    Rx / DC Orders ED Discharge Orders     None         Venna Berberich, Jeannett Senior, MD 06/01/23 [phone]

## 2023-06-01 NOTE — Progress Notes (Signed)
[facility] PROGRESS NOTE    Progress Note  Penny [name]  KGM:[id number] DOB: 1933-04-03 DOA: 06/01/2023 PCP: Penny Cowboy, MD     Brief Narrative:   Penny [name] is an 89 y.o. female past medical history significant for COPD, allergic rhinitis depression admitted to hospital hospital for traumatic intracranial hemorrhage CT of the head showed punctuated foci of acute intraparenchymal hemorrhage within the right frontal cortex without any midline shift cervical C-spine showed no evidence of acute fracture or subluxation.  The case was discussed with neurosurgery Dr. Conchita [name] who recommended repeated CT   Assessment/Plan:   Traumatic intracranial hemorrhage : Neurosurgery was curb sided recommended to repeat a CT of the head in 12 hours, patient is okay to remain at [facility] long hospital. Continue neurochecks regularly. IV labetalol for systolic blood pressure greater than 160 holding antiplatelet therapy. PT and INR unremarkable.  Acute left proximal humeral fracture: Show acute left humeral fracture shaft, case discussed with orthopedic surgery [facility] who recommended splint for immobilization. An outpatient follow-up with Dr. Aundria [name] continue analgesics for pain control. Continue analgesics for pain control, started on MiraLAX p.o. twice daily  Mechanical fall: PT OT has been consulted.  Hypokalemia Replete orally recheck in the morning.  COPD exacerbation: Continue inhalers no changes made.  Allergic rhinitis: Continue Zyrtec.   DVT prophylaxis: SCD Family Communication:none Status is: Observation The patient remains OBS appropriate and will d/c before 2 midnights.    Code Status:     Code Status Orders  (From admission, onward)           Start     Ordered   06/01/23 0506  Full code  Continuous       Question:  By:  Answer:  Consent: discussion documented in EHR   06/01/23 0505           Code Status History     Date  Active Date Inactive Code Status Order ID Comments User Context   09/10/2022 2142 09/13/2022 1804 Full Code [id number]  Penny Drafts, MD ED   10/20/2014 2036 10/22/2014 2113 Full Code [id number]  Penny Miyamoto, MD Inpatient         IV Access:   Peripheral IV   Procedures and diagnostic studies:   CT Head Wo Contrast  Result Date: 06/01/2023 CLINICAL DATA:  Head trauma, moderate-severe; Neck trauma (Age >= 65y), Unwitnessed fall EXAM: CT HEAD WITHOUT CONTRAST CT CERVICAL SPINE WITHOUT CONTRAST TECHNIQUE: Multidetector CT imaging of the head and cervical spine was performed following the standard protocol without intravenous contrast. Multiplanar CT image reconstructions of the cervical spine were also generated. RADIATION DOSE REDUCTION: This exam was performed according to the departmental dose-optimization program which includes automated exposure control, adjustment of the mA and/or kV according to patient size and/or use of iterative reconstruction technique. COMPARISON:  11/13/2022 FINDINGS: CT HEAD FINDINGS Brain: A punctate focus of acute intraparenchymal hemorrhage is seen within the right frontal cortex measuring 3 mm in diameter at axial image # 27/3. No associated abnormal mass effect. No midline shift. Mild parenchymal volume loss is commensurate with the patient's age. Extensive periventricular and subcortical white matter changes are again identified, stable since prior examination, possibly reflecting sequela of Penny [name] vessel ischemia though demyelinating disease or prior radiation therapy could appear similarly. No acute infarct. No abnormal intra or extra-axial mass lesion. Ventricular size is normal. Cerebellum is unremarkable. Vascular: No hyperdense vessel or unexpected calcification. Skull: Normal. Negative for fracture or focal lesion. Sinuses/Orbits: No acute finding.  Other: Mastoid air cells and middle ear cavities are clear. CT CERVICAL SPINE FINDINGS Alignment: There is  straightening of the cervical spine. Stable 2 mm anterolisthesis C3-4 and 2 mm retrolisthesis C6-7. No acute traumatic listhesis identified. Skull base and vertebrae: Craniocervical alignment is normal. The atlantodental interval is not widened. No acute fracture of the cervical spine. Vertebral body height is preserved. Soft tissues and spinal canal: No prevertebral fluid or swelling. No visible canal hematoma. Disc levels: There is intervertebral disc space narrowing, endplate remodeling, and disc calcification throughout the cervical spine in keeping with changes of diffuse advanced degenerative disc disease. Multilevel uncovertebral and facet arthrosis results in multilevel moderate to severe neuroforaminal narrowing, most severe on the left at C4-5. Prevertebral soft tissues are not thickened on sagittal reformats. No high-grade canal stenosis. Upper chest: Negative. Other: None IMPRESSION: 1. Punctate focus of acute intraparenchymal hemorrhage within the right frontal cortex. No associated abnormal mass effect. No midline shift. 2. No acute fracture or listhesis of the cervical spine. 3. Advanced degenerative disc and degenerative joint disease resulting in multilevel moderate to severe neuroforaminal narrowing, most severe on the left at C4-5. These results were called by telephone at the time of interpretation on 06/01/2023 at 3:50 am to provider Penny [name] , who verbally acknowledged these results. Electronically Signed   By: Penny [name] M.D.   On: 06/01/2023 03:50   CT Cervical Spine Wo Contrast  Result Date: 06/01/2023 CLINICAL DATA:  Head trauma, moderate-severe; Neck trauma (Age >= 65y), Unwitnessed fall EXAM: CT HEAD WITHOUT CONTRAST CT CERVICAL SPINE WITHOUT CONTRAST TECHNIQUE: Multidetector CT imaging of the head and cervical spine was performed following the standard protocol without intravenous contrast. Multiplanar CT image reconstructions of the cervical spine were also generated.  RADIATION DOSE REDUCTION: This exam was performed according to the departmental dose-optimization program which includes automated exposure control, adjustment of the mA and/or kV according to patient size and/or use of iterative reconstruction technique. COMPARISON:  11/13/2022 FINDINGS: CT HEAD FINDINGS Brain: A punctate focus of acute intraparenchymal hemorrhage is seen within the right frontal cortex measuring 3 mm in diameter at axial image # 27/3. No associated abnormal mass effect. No midline shift. Mild parenchymal volume loss is commensurate with the patient's age. Extensive periventricular and subcortical white matter changes are again identified, stable since prior examination, possibly reflecting sequela of Szafranski vessel ischemia though demyelinating disease or prior radiation therapy could appear similarly. No acute infarct. No abnormal intra or extra-axial mass lesion. Ventricular size is normal. Cerebellum is unremarkable. Vascular: No hyperdense vessel or unexpected calcification. Skull: Normal. Negative for fracture or focal lesion. Sinuses/Orbits: No acute finding. Other: Mastoid air cells and middle ear cavities are clear. CT CERVICAL SPINE FINDINGS Alignment: There is straightening of the cervical spine. Stable 2 mm anterolisthesis C3-4 and 2 mm retrolisthesis C6-7. No acute traumatic listhesis identified. Skull base and vertebrae: Craniocervical alignment is normal. The atlantodental interval is not widened. No acute fracture of the cervical spine. Vertebral body height is preserved. Soft tissues and spinal canal: No prevertebral fluid or swelling. No visible canal hematoma. Disc levels: There is intervertebral disc space narrowing, endplate remodeling, and disc calcification throughout the cervical spine in keeping with changes of diffuse advanced degenerative disc disease. Multilevel uncovertebral and facet arthrosis results in multilevel moderate to severe neuroforaminal narrowing, most severe  on the left at C4-5. Prevertebral soft tissues are not thickened on sagittal reformats. No high-grade canal stenosis. Upper chest: Negative. Other: None IMPRESSION: 1. Punctate focus  of acute intraparenchymal hemorrhage within the right frontal cortex. No associated abnormal mass effect. No midline shift. 2. No acute fracture or listhesis of the cervical spine. 3. Advanced degenerative disc and degenerative joint disease resulting in multilevel moderate to severe neuroforaminal narrowing, most severe on the left at C4-5. These results were called by telephone at the time of interpretation on 06/01/2023 at 3:50 am to provider [facility] , who verbally acknowledged these results. Electronically Signed   By: Penny [name] M.D.   On: 06/01/2023 03:50   DG Pelvis 1-2 Views  Result Date: 06/01/2023 CLINICAL DATA:  Status post fall. EXAM: PELVIS - 1-2 VIEW COMPARISON:  None Available. FINDINGS: There is no evidence of an acute pelvic fracture or diastasis. No pelvic bone lesions are seen. Moderate severity degenerative changes seen involving both hips, in the form of joint space narrowing and acetabular sclerosis. IMPRESSION: Moderate severity degenerative changes involving both hips. Electronically Signed   By: Aram Candela M.D.   On: 06/01/2023 03:45   DG Knee Complete 4 Views Left  Result Date: 06/01/2023 CLINICAL DATA:  Status post fall. EXAM: LEFT KNEE - COMPLETE 4+ VIEW COMPARISON:  None Available. FINDINGS: No evidence of fracture, dislocation, or joint effusion. Marked severity medial and lateral chondrocalcinosis is seen. Soft tissues are unremarkable. IMPRESSION: Marked severity chondrocalcinosis. Electronically Signed   By: Aram Candela M.D.   On: 06/01/2023 03:44   DG Wrist Complete Left  Result Date: 06/01/2023 CLINICAL DATA:  Status post fall. EXAM: LEFT WRIST - COMPLETE 3+ VIEW COMPARISON:  None Available. FINDINGS: There is no evidence of an acute fracture or dislocation. Marked  severity degenerative changes are seen along the distal aspect of the left wrist, most prominent along the first carpometacarpal joint. Soft tissues are unremarkable. IMPRESSION: Marked severity degenerative changes without an acute fracture or dislocation. Electronically Signed   By: Aram Candela M.D.   On: 06/01/2023 03:43   DG Elbow Complete Left  Result Date: 06/01/2023 CLINICAL DATA:  Status post fall. EXAM: LEFT ELBOW - COMPLETE 3+ VIEW COMPARISON:  None Available. FINDINGS: There is an acute fracture deformity involving the visualized portion of the proximal left humeral shaft. There is no evidence of dislocation. There is no evidence of arthropathy or other focal bone abnormality. Soft tissues are unremarkable. IMPRESSION: Acute fracture of the proximal left humeral shaft. Electronically Signed   By: Aram Candela M.D.   On: 06/01/2023 03:42   DG Shoulder Left  Result Date: 06/01/2023 CLINICAL DATA:  Status post fall. EXAM: LEFT SHOULDER - 2+ VIEW COMPARISON:  None Available. FINDINGS: There is an acute fracture deformity involving the proximal left humeral shaft. There is no evidence of dislocation. Degenerative changes are seen involving the left acromioclavicular joint. Soft tissue swelling is seen surrounding the previously noted fracture site. IMPRESSION: Acute fracture of the proximal left humeral shaft. Electronically Signed   By: Aram Candela M.D.   On: 06/01/2023 03:41   DG Humerus Left  Result Date: 06/01/2023 CLINICAL DATA:  Status post fall. EXAM: LEFT HUMERUS - 2+ VIEW COMPARISON:  None Available. FINDINGS: There is an acute fracture of the proximal left humeral shaft. Approximately 1-2 shaft width medial displacement of the distal fracture site is seen. There is no evidence of dislocation. Moderate severity soft tissue swelling is seen along the previously noted fracture site. IMPRESSION: Acute fracture of the proximal left humeral shaft. Electronically Signed   By:  Aram Candela M.D.   On: 06/01/2023 03:40  DG Chest 2 View  Result Date: 06/01/2023 CLINICAL DATA:  Status post fall. EXAM: CHEST - 2 VIEW COMPARISON:  November 13, 2022 FINDINGS: The lateral views limited in evaluation secondary to positioning of the patient's left upper extremity. The heart size and mediastinal contours are within normal limits. Both lungs are clear. An acute, mildly displaced fracture deformity is seen involving the proximal left humeral shaft. Multiple chronic right-sided rib fractures are noted. IMPRESSION: 1. No active cardiopulmonary disease. 2. Acute, mildly displaced fracture of the proximal left humeral shaft. Electronically Signed   By: Aram Candela M.D.   On: 06/01/2023 03:39     Medical Consultants:   None.   Subjective:    Penny [name] has no new complaint except for her pain she was go to the bathroom.  Objective:    Vitals:   06/01/23 0430 06/01/23 0445 06/01/23 0600 06/01/23 0630  BP: (!) 144/112 (!) 137/91 (!) 141/92 (!) 140/116  Pulse: (!) 105 97 92 93  Resp: (!) 22 (!) 22 16 19   Temp:      TempSrc:      SpO2: 96% 92% 91% 94%   SpO2: 94 %  No intake or output data in the 24 hours ending 06/01/23 0659 There were no vitals filed for this visit.  Exam: General exam: In no acute distress. Respiratory system: Good air movement and clear to auscultation. Cardiovascular system: S1 & S2 heard, RRR. No JVD. Gastrointestinal system: Abdomen is nondistended, soft and nontender.  Extremities: No pedal edema. Skin: No rashes, lesions or ulcers Psychiatry: Judgement and insight appear normal. Mood & affect appropriate.    Data Reviewed:    Labs: Basic Metabolic Panel: Recent Labs  Lab 06/01/23 0239  NA 137  K 2.9*  CL 104  CO2 24  GLUCOSE 172*  BUN 13  CREATININE 0.71  CALCIUM 9.1  MG 1.5*   GFR Estimated Creatinine Clearance: 43.3 mL/min (by C-G formula based on SCr of 0.71 mg/dL). Liver Function Tests: No  results for input(s): "AST", "ALT", "ALKPHOS", "BILITOT", "PROT", "ALBUMIN" in the last 168 hours. No results for input(s): "LIPASE", "AMYLASE" in the last 168 hours. No results for input(s): "AMMONIA" in the last 168 hours. Coagulation profile No results for input(s): "INR", "PROTIME" in the last 168 hours. COVID-19 Labs  No results for input(s): "DDIMER", "FERRITIN", "LDH", "CRP" in the last 72 hours.  No results found for: "SARSCOV2NAA"  CBC: Recent Labs  Lab 06/01/23 0239  WBC 10.0  NEUTROABS 8.5*  HGB 11.9*  HCT 38.1  MCV 87.4  PLT 183   Cardiac Enzymes: Recent Labs  Lab 06/01/23 0239  CKTOTAL 224   BNP (last 3 results) No results for input(s): "PROBNP" in the last 8760 hours. CBG: No results for input(s): "GLUCAP" in the last 168 hours. D-Dimer: No results for input(s): "DDIMER" in the last 72 hours. Hgb A1c: No results for input(s): "HGBA1C" in the last 72 hours. Lipid Profile: No results for input(s): "CHOL", "HDL", "LDLCALC", "TRIG", "CHOLHDL", "LDLDIRECT" in the last 72 hours. Thyroid function studies: No results for input(s): "TSH", "T4TOTAL", "T3FREE", "THYROIDAB" in the last 72 hours.  Invalid input(s): "FREET3" Anemia work up: No results for input(s): "VITAMINB12", "FOLATE", "FERRITIN", "TIBC", "IRON", "RETICCTPCT" in the last 72 hours. Sepsis Labs: Recent Labs  Lab 06/01/23 0239  WBC 10.0   Microbiology No results found for this or any previous visit (from the past 240 hour(s)).   Medications:    buPROPion  75 mg Oral Daily  mometasone-formoterol  2 puff Inhalation BID   montelukast  10 mg Oral QHS   umeclidinium bromide  1 puff Inhalation Daily   Continuous Infusions:    LOS: 0 days   Marinda Elk  [facility]  06/01/2023, 6:59 AM

## 2023-06-01 NOTE — H&P (Signed)
History and Physical      Penny [name] ZOX:[id number] DOB: [birth year] DOA: 06/01/2023; DOS: 06/01/2023  PCP: Lucky Cowboy, MD  Patient coming from: home   I have personally briefly reviewed patient's old medical records in [facility]  Chief Complaint: Fall  HPI: Penny [name] is a 89 y.o. female with medical history significant for COPD, allergic rhinitis, depression, who is admitted to [facility] on 06/01/2023 with intracranial hemorrhage after presenting from home to [facility] ED complaining of fall.   The patient reports that while ambulating with her walker at home, she tripped, and fell to her left side striking her left shoulder as the principal point of contact with the floor blue, will also noting that she struck her head as a component of this fall.  Not associate any loss of consciousness.  She reported immediate development of sharp, nonradiating left shoulder discomfort, worse with movement of the left upper extremity.  She denies any significant headache, nor any associated acute focal weakness, acute focal numbness, paresthesias, vertigo acute change in vision, dysphagia, slurred speech, facial droop.    She conveys that she is on a daily baby aspirin at home, but otherwise on no blood thinners.  Denies any recent subjective fever, chills, rigors, or generalized myalgias.  No recent chest pain.  Denies any recent dysuria or gross hematuria.     ED Course:  Vital signs in the ED were notable for the following: Afebrile; heart rates in the 90s low 100s; soft blood pressures in the 130s to 140s; respiratory rate 17-22, oxygen saturation 94 to 96% on room air.  Labs were notable for the following: BMP notable for sodium 137, potassium 2.9, creatinine 0.71.  CBC notable for white blood cell count 10,000, hemoglobin 11.9 compared to most of his prior hemoglobin data point of 12.6 on 05/14/2023, platelet count 183.  Per my interpretation, EKG in ED  demonstrated the following: Sinus tachycardia with heart rate 109, normal intervals, no evidence of T wave or ST changes, including no evidence of ST elevation.  Imaging, per associated formal radiology reads, were notable for the following: Plain films of the left humerus, shoulder, elbow showed acute fracture of the proximal left humeral shaft.  Plain films of the left knee show no evidence of acute fracture or dislocation.  Plain films of the pelvis showed no evidence of acute fracture or dislocation.  2 view chest x-ray showed no evidence of acute cardiopulmonary process, Cleen evidence of infiltrate, edema, effusion, or pneumothorax.  Noncontrast CT head showed punctate focus of acute intraparenchymal hemorrhage within the right frontal cortex, without any associated mass effect or midline shift.  CT cervical spine showed no evidence of acute cervical spine fracture or subluxation injury.  EDP discussed patient's case with on-call neurosurgery, Dr. Patric Dykes, Who recommended repeat CT head in 8 to 12 hours.  Dr.Nunkumar felt that the patient was okay to remain at [facility] long, and did not require additional follow-up from a neurosurgical standpoint unless there is worsening bleed on f/u CT head or development of acute focal neurologic deficits.  Additionally, as the patient has an acute left humerus fracture, EDP discussed patient's case with Dr. Odis [facility], who recommended shoulder immobilizer, splint, as well as follow-up with Dr. Aundria Rud in Ortho clinic.   While in the ED, the following were administered: Potassium chloride 40 mill equivalents p.o. x 1 dose  Subsequently, the patient was admitted for observation for further evaluation management of presenting intracranial hemorrhage  along with left proximal humerus fracture after presenting with ground-level mechanical fall, with additional labs notable for hypokalemia.    Review of Systems: As per HPI otherwise 10 point review of  systems negative.   Past Medical History:  Diagnosis Date   COPD (chronic obstructive pulmonary disease) with emphysema (HCC)    COVID-19 12/09/2019   Hyperlipidemia    Hypertension    Lung cancer, upper lobe (HCC) 12/04/1996   RUL   Osteoporosis    S/P partial lobectomy of lung    [id number]   Thigh shingles 12/04/1996   Thoracic aorta atherosclerosis (HCC)    Vitamin D deficiency     Past Surgical History:  Procedure Laterality Date   CARPAL TUNNEL RELEASE Right    2009   LUNG REMOVAL, PARTIAL Right    RUL 1998   OLECRANON BURSECTOMY Left 03/20/2017   Procedure: EXICISION OLECRANON BURSA LEFT ELBOW;  Surgeon: Cindee Salt, MD;  Location: [facility];  Service: Orthopedics;  Laterality: Left;  with scb block in preop   SHOULDER ADHESION RELEASE Right    2004    Social History:  reports that she quit smoking about 26 years ago. Her smoking use included cigarettes. She has never used smokeless tobacco. She reports current alcohol use of about 1.0 standard drink of alcohol per week. She reports that she does not use drugs.   Allergies  Allergen Reactions   Ace Inhibitors Cough   Calcium-Containing Compounds Other (See Comments)    constpation   Lactose Intolerance (Gi) Diarrhea   Polysporin [Bacitracin-Polymyxin B] Rash    Polysporin or neosporin gave her a rash, couldn't remember which    Family History  Problem Relation Age of Onset   Heart disease Mother    Lung cancer Father    Breast cancer Paternal Aunt     Family history reviewed and not pertinent    Prior to Admission medications   Medication Sig Start Date End Date Taking? Authorizing Provider  aspirin 81 MG chewable tablet Chew 81 mg by mouth every evening.    [provider]  Budeson-Glycopyrrol-Formoterol (BREZTRI AEROSPHERE) 160-9-4.8 MCG/ACT AERO Inhale 1 puff into the lungs in the morning and at bedtime. 03/21/23   Raynelle Dick, NP  buPROPion [facility]) 75 MG tablet One  tablet once a day 05/14/23   Raynelle Dick, NP  cetirizine (ZYRTEC) 10 MG tablet Takes 1 tablet Daily Patient taking differently: Take 10 mg by mouth every evening. 05/02/19   Lucky Cowboy, MD  Cholecalciferol 125 MCG (5000 UT) capsule Take 1 capsules Daily Patient not taking: Reported on 03/21/2023 12/01/21   Judd Gaudier, NP  Cyanocobalamin (VITAMIN B-12) 1000 MCG SUBL Takes 1 tab SL Daily Patient not taking: Reported on 03/21/2023 05/02/19   Lucky Cowboy, MD  montelukast (SINGULAIR) 10 MG tablet Take  1 tablet  Daily for Allergies                                                            /                            TAKE  BY                     MOUTH 05/08/23   Lucky Cowboy, MD  simvastatin (ZOCOR) 40 MG tablet Take  1 tablet  every Night for Cholesterol                                                         /                                                TAKE                                                    BY                                                      MOUTH Patient not taking: Reported on 05/14/2023 05/08/23   Lucky Cowboy, MD     Objective    Physical Exam: Vitals:   06/01/23 0400 06/01/23 0415 06/01/23 0430 06/01/23 0445  BP: 127/75 (!) 140/84 (!) 144/112 (!) 137/91  Pulse: 99 98 (!) 105 97  Resp: 20 13 (!) 22 (!) 22  Temp:      TempSrc:      SpO2: 94% 94% 96% 92%    General: appears to be stated age; alert, oriented Skin: warm, dry, no rash Mouth:  Oral mucosa membranes appear moist, normal dentition Neck: supple; trachea midline Heart:  RRR; did not appreciate any M/R/G Lungs: CTAB, did not appreciate any wheezes, rales, or rhonchi Abdomen: + BS; soft, ND, NT Vascular: 2+ pedal pulses b/l; 2+ radial pulses b/l Extremities: no peripheral edema, no muscle wasting Neuro: sensation intact in upper and lower extremities b/l; refrain from strength assessment involving the left upper extremity in the context of presenting acute  proximal left humerus fracture     Labs on Admission: I have personally reviewed following labs and imaging studies  CBC: Recent Labs  Lab 06/01/23 0239  WBC 10.0  NEUTROABS 8.5*  HGB 11.9*  HCT 38.1  MCV 87.4  PLT 183   Basic Metabolic Panel: Recent Labs  Lab 06/01/23 0239  NA 137  K 2.9*  CL 104  CO2 24  GLUCOSE 172*  BUN 13  CREATININE 0.71  CALCIUM 9.1   GFR: Estimated Creatinine Clearance: 43.3 mL/min (by C-G formula based on SCr of 0.71 mg/dL). Liver Function Tests: No results for input(s): "AST", "ALT", "ALKPHOS", "BILITOT", "PROT", "ALBUMIN" in the last 168 hours. No results for input(s): "LIPASE", "AMYLASE" in the last 168 hours. No results for input(s): "AMMONIA" in the last 168 hours. Coagulation Profile: No results for input(s): "INR", "PROTIME" in the last 168 hours. Cardiac Enzymes: Recent Labs  Lab 06/01/23 0239  CKTOTAL 224   BNP (last 3 results) No results for input(s): "PROBNP" in the last 8760 hours.  HbA1C: No results for input(s): "HGBA1C" in the last 72 hours. CBG: No results for input(s): "GLUCAP" in the last 168 hours. Lipid Profile: No results for input(s): "CHOL", "HDL", "LDLCALC", "TRIG", "CHOLHDL", "LDLDIRECT" in the last 72 hours. Thyroid Function Tests: No results for input(s): "TSH", "T4TOTAL", "FREET4", "T3FREE", "THYROIDAB" in the last 72 hours. Anemia Panel: No results for input(s): "VITAMINB12", "FOLATE", "FERRITIN", "TIBC", "IRON", "RETICCTPCT" in the last 72 hours. Urine analysis:    Component Value Date/Time   COLORURINE YELLOW 05/14/2023 1627   APPEARANCEUR CLEAR 05/14/2023 1627   LABSPEC 1.018 05/14/2023 1627   PHURINE 7.0 05/14/2023 1627   GLUCOSEU NEGATIVE 05/14/2023 1627   HGBUR NEGATIVE 05/14/2023 1627   BILIRUBINUR NEGATIVE 11/13/2022 1346   KETONESUR NEGATIVE 05/14/2023 1627   PROTEINUR TRACE (A) 05/14/2023 1627   NITRITE NEGATIVE 05/14/2023 1627   LEUKOCYTESUR TRACE (A) 05/14/2023 1627     Radiological Exams on Admission: CT Head Wo Contrast  Result Date: 06/01/2023 CLINICAL DATA:  Head trauma, moderate-severe; Neck trauma (Age >= 65y), Unwitnessed fall EXAM: CT HEAD WITHOUT CONTRAST CT CERVICAL SPINE WITHOUT CONTRAST TECHNIQUE: Multidetector CT imaging of the head and cervical spine was performed following the standard protocol without intravenous contrast. Multiplanar CT image reconstructions of the cervical spine were also generated. RADIATION DOSE REDUCTION: This exam was performed according to the departmental dose-optimization program which includes automated exposure control, adjustment of the mA and/or kV according to patient size and/or use of iterative reconstruction technique. COMPARISON:  11/13/2022 FINDINGS: CT HEAD FINDINGS Brain: A punctate focus of acute intraparenchymal hemorrhage is seen within the right frontal cortex measuring 3 mm in diameter at axial image # 27/3. No associated abnormal mass effect. No midline shift. Mild parenchymal volume loss is commensurate with the patient's age. Extensive periventricular and subcortical white matter changes are again identified, stable since prior examination, possibly reflecting sequela of Cenci vessel ischemia though demyelinating disease or prior radiation therapy could appear similarly. No acute infarct. No abnormal intra or extra-axial mass lesion. Ventricular size is normal. Cerebellum is unremarkable. Vascular: No hyperdense vessel or unexpected calcification. Skull: Normal. Negative for fracture or focal lesion. Sinuses/Orbits: No acute finding. Other: Mastoid air cells and middle ear cavities are clear. CT CERVICAL SPINE FINDINGS Alignment: There is straightening of the cervical spine. Stable 2 mm anterolisthesis C3-4 and 2 mm retrolisthesis C6-7. No acute traumatic listhesis identified. Skull base and vertebrae: Craniocervical alignment is normal. The atlantodental interval is not widened. No acute fracture of the  cervical spine. Vertebral body height is preserved. Soft tissues and spinal canal: No prevertebral fluid or swelling. No visible canal hematoma. Disc levels: There is intervertebral disc space narrowing, endplate remodeling, and disc calcification throughout the cervical spine in keeping with changes of diffuse advanced degenerative disc disease. Multilevel uncovertebral and facet arthrosis results in multilevel moderate to severe neuroforaminal narrowing, most severe on the left at C4-5. Prevertebral soft tissues are not thickened on sagittal reformats. No high-grade canal stenosis. Upper chest: Negative. Other: None IMPRESSION: 1. Punctate focus of acute intraparenchymal hemorrhage within the right frontal cortex. No associated abnormal mass effect. No midline shift. 2. No acute fracture or listhesis of the cervical spine. 3. Advanced degenerative disc and degenerative joint disease resulting in multilevel moderate to severe neuroforaminal narrowing, most severe on the left at C4-5. These results were called by telephone at the time of interpretation on 06/01/2023 at 3:50 am to provider [facility] , who verbally acknowledged these results. Electronically Signed   By: Helyn Numbers  M.D.   On: 06/01/2023 03:50   CT Cervical Spine Wo Contrast  Result Date: 06/01/2023 CLINICAL DATA:  Head trauma, moderate-severe; Neck trauma (Age >= 65y), Unwitnessed fall EXAM: CT HEAD WITHOUT CONTRAST CT CERVICAL SPINE WITHOUT CONTRAST TECHNIQUE: Multidetector CT imaging of the head and cervical spine was performed following the standard protocol without intravenous contrast. Multiplanar CT image reconstructions of the cervical spine were also generated. RADIATION DOSE REDUCTION: This exam was performed according to the departmental dose-optimization program which includes automated exposure control, adjustment of the mA and/or kV according to patient size and/or use of iterative reconstruction technique. COMPARISON:   11/13/2022 FINDINGS: CT HEAD FINDINGS Brain: A punctate focus of acute intraparenchymal hemorrhage is seen within the right frontal cortex measuring 3 mm in diameter at axial image # 27/3. No associated abnormal mass effect. No midline shift. Mild parenchymal volume loss is commensurate with the patient's age. Extensive periventricular and subcortical white matter changes are again identified, stable since prior examination, possibly reflecting sequela of Ekdahl vessel ischemia though demyelinating disease or prior radiation therapy could appear similarly. No acute infarct. No abnormal intra or extra-axial mass lesion. Ventricular size is normal. Cerebellum is unremarkable. Vascular: No hyperdense vessel or unexpected calcification. Skull: Normal. Negative for fracture or focal lesion. Sinuses/Orbits: No acute finding. Other: Mastoid air cells and middle ear cavities are clear. CT CERVICAL SPINE FINDINGS Alignment: There is straightening of the cervical spine. Stable 2 mm anterolisthesis C3-4 and 2 mm retrolisthesis C6-7. No acute traumatic listhesis identified. Skull base and vertebrae: Craniocervical alignment is normal. The atlantodental interval is not widened. No acute fracture of the cervical spine. Vertebral body height is preserved. Soft tissues and spinal canal: No prevertebral fluid or swelling. No visible canal hematoma. Disc levels: There is intervertebral disc space narrowing, endplate remodeling, and disc calcification throughout the cervical spine in keeping with changes of diffuse advanced degenerative disc disease. Multilevel uncovertebral and facet arthrosis results in multilevel moderate to severe neuroforaminal narrowing, most severe on the left at C4-5. Prevertebral soft tissues are not thickened on sagittal reformats. No high-grade canal stenosis. Upper chest: Negative. Other: None IMPRESSION: 1. Punctate focus of acute intraparenchymal hemorrhage within the right frontal cortex. No associated  abnormal mass effect. No midline shift. 2. No acute fracture or listhesis of the cervical spine. 3. Advanced degenerative disc and degenerative joint disease resulting in multilevel moderate to severe neuroforaminal narrowing, most severe on the left at C4-5. These results were called by telephone at the time of interpretation on 06/01/2023 at 3:50 am to provider [facility] , who verbally acknowledged these results. Electronically Signed   By: Helyn Numbers M.D.   On: 06/01/2023 03:50   DG Pelvis 1-2 Views  Result Date: 06/01/2023 CLINICAL DATA:  Status post fall. EXAM: PELVIS - 1-2 VIEW COMPARISON:  None Available. FINDINGS: There is no evidence of an acute pelvic fracture or diastasis. No pelvic bone lesions are seen. Moderate severity degenerative changes seen involving both hips, in the form of joint space narrowing and acetabular sclerosis. IMPRESSION: Moderate severity degenerative changes involving both hips. Electronically Signed   By: Aram Candela M.D.   On: 06/01/2023 03:45   DG Knee Complete 4 Views Left  Result Date: 06/01/2023 CLINICAL DATA:  Status post fall. EXAM: LEFT KNEE - COMPLETE 4+ VIEW COMPARISON:  None Available. FINDINGS: No evidence of fracture, dislocation, or joint effusion. Marked severity medial and lateral chondrocalcinosis is seen. Soft tissues are unremarkable. IMPRESSION: Marked severity chondrocalcinosis. Electronically Signed  By: Aram Candela M.D.   On: 06/01/2023 03:44   DG Wrist Complete Left  Result Date: 06/01/2023 CLINICAL DATA:  Status post fall. EXAM: LEFT WRIST - COMPLETE 3+ VIEW COMPARISON:  None Available. FINDINGS: There is no evidence of an acute fracture or dislocation. Marked severity degenerative changes are seen along the distal aspect of the left wrist, most prominent along the first carpometacarpal joint. Soft tissues are unremarkable. IMPRESSION: Marked severity degenerative changes without an acute fracture or dislocation.  Electronically Signed   By: Aram Candela M.D.   On: 06/01/2023 03:43   DG Elbow Complete Left  Result Date: 06/01/2023 CLINICAL DATA:  Status post fall. EXAM: LEFT ELBOW - COMPLETE 3+ VIEW COMPARISON:  None Available. FINDINGS: There is an acute fracture deformity involving the visualized portion of the proximal left humeral shaft. There is no evidence of dislocation. There is no evidence of arthropathy or other focal bone abnormality. Soft tissues are unremarkable. IMPRESSION: Acute fracture of the proximal left humeral shaft. Electronically Signed   By: Aram Candela M.D.   On: 06/01/2023 03:42   DG Shoulder Left  Result Date: 06/01/2023 CLINICAL DATA:  Status post fall. EXAM: LEFT SHOULDER - 2+ VIEW COMPARISON:  None Available. FINDINGS: There is an acute fracture deformity involving the proximal left humeral shaft. There is no evidence of dislocation. Degenerative changes are seen involving the left acromioclavicular joint. Soft tissue swelling is seen surrounding the previously noted fracture site. IMPRESSION: Acute fracture of the proximal left humeral shaft. Electronically Signed   By: Aram Candela M.D.   On: 06/01/2023 03:41   DG Humerus Left  Result Date: 06/01/2023 CLINICAL DATA:  Status post fall. EXAM: LEFT HUMERUS - 2+ VIEW COMPARISON:  None Available. FINDINGS: There is an acute fracture of the proximal left humeral shaft. Approximately 1-2 shaft width medial displacement of the distal fracture site is seen. There is no evidence of dislocation. Moderate severity soft tissue swelling is seen along the previously noted fracture site. IMPRESSION: Acute fracture of the proximal left humeral shaft. Electronically Signed   By: Aram Candela M.D.   On: 06/01/2023 03:40   DG Chest 2 View  Result Date: 06/01/2023 CLINICAL DATA:  Status post fall. EXAM: CHEST - 2 VIEW COMPARISON:  November 13, 2022 FINDINGS: The lateral views limited in evaluation secondary to positioning of the  patient's left upper extremity. The heart size and mediastinal contours are within normal limits. Both lungs are clear. An acute, mildly displaced fracture deformity is seen involving the proximal left humeral shaft. Multiple chronic right-sided rib fractures are noted. IMPRESSION: 1. No active cardiopulmonary disease. 2. Acute, mildly displaced fracture of the proximal left humeral shaft. Electronically Signed   By: Aram Candela M.D.   On: 06/01/2023 03:39      Assessment/Plan    Principal Problem:   Intracranial hemorrhage (HCC) Active Problems:   Hypokalemia   Closed fracture of left proximal humerus   Fall at home, initial encounter   History of COPD   Allergic rhinitis   Depression    #) Acute intracranial hemorrhage: Stemming from today's ground-level mechanical fall but she did hit her head, CT head shows punctate focus of acute intraparenchymal hemorrhage within the right frontal cortex, without any associated mass effect or midline shift.  Not associate any acute focal neurologic deficits.  She is on a daily baby aspirin, but otherwise no blood thinners as an outpatient.  Vital signs appear reassuring from the standpoint of evaluating for increasing  intracranial pressure, noting no evidence of worsening hypertension or development of bradycardia.  EDP discussed patient's case with on-call neurosurgery, Dr. Patric Dykes, Who recommended repeat CT head in 8 to 12 hours.  Dr.Nunkumar felt that the patient was okay to remain at [facility] long, and did not require additional follow-up from a neurosurgical standpoint unless there is worsening bleed on f/u CT head or development of acute focal neurologic deficits.     Plan: CT head ordered for noon today.  Every 4 hours neurochecks.  Monitor on symmetry.  As needed IV labetalol for systolic blood pressure greater than 160.  Hold him daily baby aspirin for now.  Add on INR/PTT.  Fall precautions ordered.  Repeat CBC this  morning.            #) Acute left proximal humerus fracture: Identified on today's plain films, as above, as a consequence of ground-level mechanical fall earlier today.  Left upper extremity appears neurovascularly intact  EDP discussed patient's case with Dr. [facility], who recommended shoulder immobilizer, splint, as well as follow-up with Dr. Aundria Rud in Ortho clinic.   Plan: Left shoulder immobilizer and humerus splint, per orthopedic surgery.  With plan for outpatient orthopedic clinic follow-up with Dr. Aundria Rud, as above.  Prn Norco.             #) Ground-level mechanical fall: As scribed above.  Appears to be purely mechanical in nature with the patient tripping while ambulating at home, but will also check urinalysis to evaluate for any underlying infectious contribution that may have predisposed her to this fall.  Plan: Fall precautions.  PT/OT consults.  Check urinalysis, as above.                 #) Hypokalemia: presenting potassium level noted to be 2.9.  Status post 40 mill equivalents of oral potassium chloride administered in the ED today  Plan: monitor on tele. Add-on serum mag level. CMP, mag level in the AM.                #) COPD: Documented history thereof, without clinical evidence of acute exacerbation at this time.   Outpatient respiratory regimen includes the following: Breztri inhaler.   Plan: cont outpatient Breztri. Prn albuterol nebulizer. Check CMP and serum magnesium level in the AM.                  #) Allergic Rhinitis: documented h/o such, on scheduled Zyrtec as outpatient.    Plan: cont home Zyrtec.                  #) Depression: documented h/o such. On Wellbutrin as outpatient.    Plan: Continue home Wellbutrin for now.     DVT prophylaxis: SCD's   Code Status: Full code Family Communication: none Disposition Plan: Per Rounding Team Consults called: EDP  discussed patient's case with on-call neurosurgery as well as on-call [facility], as further detailed above;  Admission status: Observation    I SPENT GREATER THAN 75  MINUTES IN CLINICAL CARE TIME/MEDICAL DECISION-MAKING IN COMPLETING THIS ADMISSION.     Chaney Born Taija Mathias DO [facility] From 7PM - 7AM   06/01/2023, 5:13 AM

## 2023-06-01 NOTE — Progress Notes (Signed)
Orthopedic Tech Progress Note Patient Details:  Penny [name] 09-25-1933 [id number]  Ortho Devices Type of Ortho Device: Long arm splint Ortho Device/Splint Location: LUE Ortho Device/Splint Interventions: Ordered, Application, Adjustment   Post Interventions Patient Tolerated: Well  Al Decant 06/01/2023, 5:37 AM

## 2023-06-01 NOTE — ED Notes (Signed)
ED TO INPATIENT HANDOFF REPORT  Name/Age/Gender Penny [name] 89 y.o. female  Code Status    Code Status Orders  (From admission, onward)           Start     Ordered   06/01/23 0506  Full code  Continuous       Question:  By:  Answer:  Consent: discussion documented in EHR   06/01/23 0505           Code Status History     Date Active Date Inactive Code Status Order ID Comments User Context   09/10/2022 2142 09/13/2022 1804 Full Code [id number]  Vonna Drafts, MD ED   10/20/2014 2036 10/22/2014 2113 Full Code [id number]  Abigail Miyamoto, MD Inpatient       Home/SNF/Other Home  Chief Complaint Intracranial hemorrhage [facility]) [I62.9]  Level of Care/Admitting Diagnosis ED Disposition     ED Disposition  Admit   Condition  --   Comment  Hospital Area: [facility] [[id number]]  Level of Care: Progressive [102]  Admit to Progressive based on following criteria: MULTISYSTEM THREATS such as stable sepsis, metabolic/electrolyte imbalance with or without encephalopathy that is responding to early treatment.  May place patient in observation at [facility] or [facility] if equivalent level of care is available:: No  Covid Evaluation: Asymptomatic - no recent exposure (last 10 days) testing not required  Diagnosis: Intracranial hemorrhage [facility]) [[id number]]  Admitting Physician: Angie Fava [[id number]]  Attending Physician: Angie Fava [[id number]]          Medical History Past Medical History:  Diagnosis Date   COPD (chronic obstructive pulmonary disease) with emphysema (HCC)    COVID-19 12/09/2019   Hyperlipidemia    Hypertension    Lung cancer, upper lobe (HCC) 12/04/1996   RUL   Osteoporosis    S/P partial lobectomy of lung    [id number]   Thigh shingles 12/04/1996   Thoracic aorta atherosclerosis (HCC)    Vitamin D deficiency     Allergies Allergies  Allergen Reactions   Ace Inhibitors Cough   Calcium-Containing Compounds  Other (See Comments)    constpation   Lactose Intolerance (Gi) Diarrhea   Polysporin [Bacitracin-Polymyxin B] Rash    Polysporin or neosporin gave her a rash, couldn't remember which    IV Location/Drains/Wounds Patient Lines/Drains/Airways Status     Active Line/Drains/Airways     Name Placement date Placement time Site Days   Peripheral IV 06/01/23 20 G Anterior;Right Forearm 06/01/23  0240  Forearm  less than 1   External Urinary Catheter 06/01/23  0717  --  less than 1            Labs/Imaging Results for orders placed or performed during the hospital encounter of 06/01/23 (from the past 48 hour(s))  CBC with Differential     Status: Abnormal   Collection Time: 06/01/23  2:39 AM  Result Value Ref Range   WBC 10.0 4.0 - 10.5 K/uL   RBC 4.36 3.87 - 5.11 MIL/uL   Hemoglobin 11.9 (L) 12.0 - 15.0 g/dL   HCT 40.1 02.7 - 25.3 %   MCV 87.4 80.0 - 100.0 fL   MCH 27.3 26.0 - 34.0 pg   MCHC 31.2 30.0 - 36.0 g/dL   RDW 66.4 40.3 - 47.4 %   Platelets 183 150 - 400 K/uL   nRBC 0.0 0.0 - 0.2 %   Neutrophils Relative % 85 %   Neutro Abs 8.5 (H) 1.7 - 7.7  K/uL   Lymphocytes Relative 8 %   Lymphs Abs 0.8 0.7 - 4.0 K/uL   Monocytes Relative 7 %   Monocytes Absolute 0.7 0.1 - 1.0 K/uL   Eosinophils Relative 0 %   Eosinophils Absolute 0.0 0.0 - 0.5 K/uL   Basophils Relative 0 %   Basophils Absolute 0.0 0.0 - 0.1 K/uL   Immature Granulocytes 0 %   Abs Immature Granulocytes 0.04 0.00 - 0.07 K/uL    Comment: Performed at [facility], [address], [place], [place] [address]  Basic metabolic panel     Status: Abnormal   Collection Time: 06/01/23  2:39 AM  Result Value Ref Range   Sodium 137 135 - 145 mmol/L   Potassium 2.9 (L) 3.5 - 5.1 mmol/L   Chloride 104 98 - 111 mmol/L   CO2 24 22 - 32 mmol/L   Glucose, Bld 172 (H) 70 - 99 mg/dL    Comment: Glucose reference range applies only to samples taken after fasting for at least 8 hours.   BUN 13 8 - 23 mg/dL    Creatinine, Ser 6.04 0.44 - 1.00 mg/dL   Calcium 9.1 8.9 - 54.0 mg/dL   GFR, Estimated >98 >11 mL/min    Comment: (NOTE) Calculated using the CKD-EPI Creatinine Equation (2021)    Anion gap 9 5 - 15    Comment: Performed at [facility], [address], [place], [place] [address]  CK     Status: None   Collection Time: 06/01/23  2:39 AM  Result Value Ref Range   Total CK 224 38 - 234 U/L    Comment: Performed at [facility], [address], [place], [place] [address]  Troponin I (High Sensitivity)     Status: None   Collection Time: 06/01/23  2:39 AM  Result Value Ref Range   Troponin I (High Sensitivity) 12 <18 ng/L    Comment: (NOTE) Elevated high sensitivity troponin I (hsTnI) values and significant  changes across serial measurements may suggest ACS but many other  chronic and acute conditions are known to elevate hsTnI results.  Refer to the "Links" section for chest pain algorithms and additional  guidance. Performed at [facility], [address] [place], [place] [address]   Magnesium     Status: Abnormal   Collection Time: 06/01/23  2:39 AM  Result Value Ref Range   Magnesium 1.5 (L) 1.7 - 2.4 mg/dL    Comment: Performed at [facility], [address] [place], [place] [address]  Protime-INR     Status: None   Collection Time: 06/01/23  2:39 AM  Result Value Ref Range   Prothrombin Time 13.0 11.4 - 15.2 seconds   INR 1.0 0.8 - 1.2    Comment: (NOTE) INR goal varies based on device and disease states. Performed at [facility], [address], [place], [place] [address]   APTT     Status: None   Collection Time: 06/01/23  2:39 AM  Result Value Ref Range   aPTT 25 24 - 36 seconds    Comment: Performed at [facility], [address], [place], [place] [address]  Troponin I (High Sensitivity)     Status: None   Collection Time: 06/01/23  5:16 AM   Result Value Ref Range   Troponin I (High Sensitivity) 11 <18 ng/L    Comment: (NOTE) Elevated high sensitivity troponin I (hsTnI) values and significant  changes across serial measurements may suggest ACS but many other  chronic and acute conditions are known to elevate hsTnI results.  Refer to the "Links" section for chest pain algorithms and additional  guidance. Performed at [facility], [address], [place], [place] [address]   Urinalysis, Complete w Microscopic -Urine, Clean Catch     Status: Abnormal   Collection Time: 06/01/23  7:51 AM  Result Value Ref Range   Color, Urine STRAW (A) YELLOW   APPearance CLEAR CLEAR   Specific Gravity, Urine 1.009 1.005 - 1.030   pH 7.0 5.0 - 8.0   Glucose, UA NEGATIVE NEGATIVE mg/dL   Hgb urine dipstick NEGATIVE NEGATIVE   Bilirubin Urine NEGATIVE NEGATIVE   Ketones, ur NEGATIVE NEGATIVE mg/dL   Protein, ur NEGATIVE NEGATIVE mg/dL   Nitrite NEGATIVE NEGATIVE   Leukocytes,Ua NEGATIVE NEGATIVE   RBC / HPF 0-5 0 - 5 RBC/hpf   WBC, UA 0-5 0 - 5 WBC/hpf   Bacteria, UA NONE SEEN NONE SEEN   Squamous Epithelial / HPF 0-5 0 - 5 /HPF    Comment: Performed at [facility], [address], [place], [place] [address]  Comprehensive metabolic panel     Status: Abnormal   Collection Time: 06/01/23  9:38 AM  Result Value Ref Range   Sodium 139 135 - 145 mmol/L   Potassium 3.7 3.5 - 5.1 mmol/L   Chloride 106 98 - 111 mmol/L   CO2 25 22 - 32 mmol/L   Glucose, Bld 130 (H) 70 - 99 mg/dL    Comment: Glucose reference range applies only to samples taken after fasting for at least 8 hours.   BUN 11 8 - 23 mg/dL   Creatinine, Ser 0.98 0.44 - 1.00 mg/dL   Calcium 8.7 (L) 8.9 - 10.3 mg/dL   Total Protein 6.4 (L) 6.5 - 8.1 g/dL   Albumin 3.6 3.5 - 5.0 g/dL   AST 24 15 - 41 U/L   ALT 15 0 - 44 U/L   Alkaline Phosphatase 60 38 - 126 U/L   Total Bilirubin 0.8 0.3 - 1.2 mg/dL   GFR, Estimated >11 >91 mL/min     Comment: (NOTE) Calculated using the CKD-EPI Creatinine Equation (2021)    Anion gap 8 5 - 15    Comment: Performed at [facility], [address] [place], [place] [address]  CBC with Differential/Platelet     Status: Abnormal   Collection Time: 06/01/23  9:38 AM  Result Value Ref Range   WBC 6.7 4.0 - 10.5 K/uL   RBC 4.22 3.87 - 5.11 MIL/uL   Hemoglobin 11.6 (L) 12.0 - 15.0 g/dL   HCT 56.2 13.0 - 86.5 %   MCV 87.2 80.0 - 100.0 fL   MCH 27.5 26.0 - 34.0 pg   MCHC 31.5 30.0 - 36.0 g/dL   RDW 78.4 69.6 - 29.5 %   Platelets 194 150 - 400 K/uL   nRBC 0.0 0.0 - 0.2 %   Neutrophils Relative % 72 %   Neutro Abs 4.8 1.7 - 7.7 K/uL   Lymphocytes Relative 15 %   Lymphs Abs 1.0 0.7 - 4.0 K/uL   Monocytes Relative 11 %   Monocytes Absolute 0.8 0.1 - 1.0 K/uL   Eosinophils Relative 0 %   Eosinophils Absolute 0.0 0.0 - 0.5 K/uL   Basophils Relative 0 %   Basophils Absolute 0.0 0.0 - 0.1 K/uL   Immature Granulocytes 2 %   Abs  Immature Granulocytes 0.10 (H) 0.00 - 0.07 K/uL    Comment: Performed at [facility], [address], [place], [place] [address]   CT HEAD WO CONTRAST ( )  Result Date: 06/01/2023 CLINICAL DATA:  Follow-up hemorrhage. EXAM: CT HEAD WITHOUT CONTRAST TECHNIQUE: Contiguous axial images were obtained from the base of the skull through the vertex without intravenous contrast. RADIATION DOSE REDUCTION: This exam was performed according to the departmental dose-optimization program which includes automated exposure control, adjustment of the mA and/or kV according to patient size and/or use of iterative reconstruction technique. COMPARISON:  06/01/2023 at 3:16 a.m. and 11/13/2022. FINDINGS: Brain: Examination demonstrates continued evidence of 3 mm hypodense focus over the right frontal region which may be parenchymal versus subarachnoid in location. This is unchanged in size, but slightly less dense compared to the recent prior study. No  additional sites of hemorrhage. Remainder of the exam is unchanged to include moderate chronic ischemic microvascular disease. Vascular: No hyperdense vessel or unexpected calcification. Skull: Normal. Negative for fracture or focal lesion. Sinuses/Orbits: No acute finding. Other: None. IMPRESSION: 1. Continued evidence of 3 mm hypodense focus over the right frontal region which may be parenchymal versus subarachnoid in location. This is unchanged in size, but slightly less dense compared to the recent prior study. No additional sites of hemorrhage. 2. Moderate chronic ischemic microvascular disease. Electronically Signed   By: Elberta Fortis M.D.   On: 06/01/2023 11:48   CT Head Wo Contrast  Result Date: 06/01/2023 CLINICAL DATA:  Head trauma, moderate-severe; Neck trauma (Age >= 65y), Unwitnessed fall EXAM: CT HEAD WITHOUT CONTRAST CT CERVICAL SPINE WITHOUT CONTRAST TECHNIQUE: Multidetector CT imaging of the head and cervical spine was performed following the standard protocol without intravenous contrast. Multiplanar CT image reconstructions of the cervical spine were also generated. RADIATION DOSE REDUCTION: This exam was performed according to the departmental dose-optimization program which includes automated exposure control, adjustment of the mA and/or kV according to patient size and/or use of iterative reconstruction technique. COMPARISON:  11/13/2022 FINDINGS: CT HEAD FINDINGS Brain: A punctate focus of acute intraparenchymal hemorrhage is seen within the right frontal cortex measuring 3 mm in diameter at axial image # 27/3. No associated abnormal mass effect. No midline shift. Mild parenchymal volume loss is commensurate with the patient's age. Extensive periventricular and subcortical white matter changes are again identified, stable since prior examination, possibly reflecting sequela of [place] vessel ischemia though demyelinating disease or prior radiation therapy could appear similarly. No acute  infarct. No abnormal intra or extra-axial mass lesion. Ventricular size is normal. Cerebellum is unremarkable. Vascular: No hyperdense vessel or unexpected calcification. Skull: Normal. Negative for fracture or focal lesion. Sinuses/Orbits: No acute finding. Other: Mastoid air cells and middle ear cavities are clear. CT CERVICAL SPINE FINDINGS Alignment: There is straightening of the cervical spine. Stable 2 mm anterolisthesis C3-4 and 2 mm retrolisthesis C6-7. No acute traumatic listhesis identified. Skull base and vertebrae: Craniocervical alignment is normal. The atlantodental interval is not widened. No acute fracture of the cervical spine. Vertebral body height is preserved. Soft tissues and spinal canal: No prevertebral fluid or swelling. No visible canal hematoma. Disc levels: There is intervertebral disc space narrowing, endplate remodeling, and disc calcification throughout the cervical spine in keeping with changes of diffuse advanced degenerative disc disease. Multilevel uncovertebral and facet arthrosis results in multilevel moderate to severe neuroforaminal narrowing, most severe on the left at C4-5. Prevertebral soft tissues are not thickened on sagittal reformats. No high-grade canal stenosis. Upper  chest: Negative. Other: None IMPRESSION: 1. Punctate focus of acute intraparenchymal hemorrhage within the right frontal cortex. No associated abnormal mass effect. No midline shift. 2. No acute fracture or listhesis of the cervical spine. 3. Advanced degenerative disc and degenerative joint disease resulting in multilevel moderate to severe neuroforaminal narrowing, most severe on the left at C4-5. These results were called by telephone at the time of interpretation on 06/01/2023 at 3:50 am to provider [facility] , who verbally acknowledged these results. Electronically Signed   By: Helyn Numbers M.D.   On: 06/01/2023 03:50   CT Cervical Spine Wo Contrast  Result Date: 06/01/2023 CLINICAL DATA:   Head trauma, moderate-severe; Neck trauma (Age >= 65y), Unwitnessed fall EXAM: CT HEAD WITHOUT CONTRAST CT CERVICAL SPINE WITHOUT CONTRAST TECHNIQUE: Multidetector CT imaging of the head and cervical spine was performed following the standard protocol without intravenous contrast. Multiplanar CT image reconstructions of the cervical spine were also generated. RADIATION DOSE REDUCTION: This exam was performed according to the departmental dose-optimization program which includes automated exposure control, adjustment of the mA and/or kV according to patient size and/or use of iterative reconstruction technique. COMPARISON:  11/13/2022 FINDINGS: CT HEAD FINDINGS Brain: A punctate focus of acute intraparenchymal hemorrhage is seen within the right frontal cortex measuring 3 mm in diameter at axial image # 27/3. No associated abnormal mass effect. No midline shift. Mild parenchymal volume loss is commensurate with the patient's age. Extensive periventricular and subcortical white matter changes are again identified, stable since prior examination, possibly reflecting sequela of [place] vessel ischemia though demyelinating disease or prior radiation therapy could appear similarly. No acute infarct. No abnormal intra or extra-axial mass lesion. Ventricular size is normal. Cerebellum is unremarkable. Vascular: No hyperdense vessel or unexpected calcification. Skull: Normal. Negative for fracture or focal lesion. Sinuses/Orbits: No acute finding. Other: Mastoid air cells and middle ear cavities are clear. CT CERVICAL SPINE FINDINGS Alignment: There is straightening of the cervical spine. Stable 2 mm anterolisthesis C3-4 and 2 mm retrolisthesis C6-7. No acute traumatic listhesis identified. Skull base and vertebrae: Craniocervical alignment is normal. The atlantodental interval is not widened. No acute fracture of the cervical spine. Vertebral body height is preserved. Soft tissues and spinal canal: No prevertebral fluid or  swelling. No visible canal hematoma. Disc levels: There is intervertebral disc space narrowing, endplate remodeling, and disc calcification throughout the cervical spine in keeping with changes of diffuse advanced degenerative disc disease. Multilevel uncovertebral and facet arthrosis results in multilevel moderate to severe neuroforaminal narrowing, most severe on the left at C4-5. Prevertebral soft tissues are not thickened on sagittal reformats. No high-grade canal stenosis. Upper chest: Negative. Other: None IMPRESSION: 1. Punctate focus of acute intraparenchymal hemorrhage within the right frontal cortex. No associated abnormal mass effect. No midline shift. 2. No acute fracture or listhesis of the cervical spine. 3. Advanced degenerative disc and degenerative joint disease resulting in multilevel moderate to severe neuroforaminal narrowing, most severe on the left at C4-5. These results were called by telephone at the time of interpretation on 06/01/2023 at 3:50 am to provider [facility] , who verbally acknowledged these results. Electronically Signed   By: Helyn Numbers M.D.   On: 06/01/2023 03:50   DG Pelvis 1-2 Views  Result Date: 06/01/2023 CLINICAL DATA:  Status post fall. EXAM: PELVIS - 1-2 VIEW COMPARISON:  None Available. FINDINGS: There is no evidence of an acute pelvic fracture or diastasis. No pelvic bone lesions are seen. Moderate severity degenerative changes seen involving  both hips, in the form of joint space narrowing and acetabular sclerosis. IMPRESSION: Moderate severity degenerative changes involving both hips. Electronically Signed   By: Aram Candela M.D.   On: 06/01/2023 03:45   DG Knee Complete 4 Views Left  Result Date: 06/01/2023 CLINICAL DATA:  Status post fall. EXAM: LEFT KNEE - COMPLETE 4+ VIEW COMPARISON:  None Available. FINDINGS: No evidence of fracture, dislocation, or joint effusion. Marked severity medial and lateral chondrocalcinosis is seen. Soft tissues are  unremarkable. IMPRESSION: Marked severity chondrocalcinosis. Electronically Signed   By: Aram Candela M.D.   On: 06/01/2023 03:44   DG Wrist Complete Left  Result Date: 06/01/2023 CLINICAL DATA:  Status post fall. EXAM: LEFT WRIST - COMPLETE 3+ VIEW COMPARISON:  None Available. FINDINGS: There is no evidence of an acute fracture or dislocation. Marked severity degenerative changes are seen along the distal aspect of the left wrist, most prominent along the first carpometacarpal joint. Soft tissues are unremarkable. IMPRESSION: Marked severity degenerative changes without an acute fracture or dislocation. Electronically Signed   By: Aram Candela M.D.   On: 06/01/2023 03:43   DG Elbow Complete Left  Result Date: 06/01/2023 CLINICAL DATA:  Status post fall. EXAM: LEFT ELBOW - COMPLETE 3+ VIEW COMPARISON:  None Available. FINDINGS: There is an acute fracture deformity involving the visualized portion of the proximal left humeral shaft. There is no evidence of dislocation. There is no evidence of arthropathy or other focal bone abnormality. Soft tissues are unremarkable. IMPRESSION: Acute fracture of the proximal left humeral shaft. Electronically Signed   By: Aram Candela M.D.   On: 06/01/2023 03:42   DG Shoulder Left  Result Date: 06/01/2023 CLINICAL DATA:  Status post fall. EXAM: LEFT SHOULDER - 2+ VIEW COMPARISON:  None Available. FINDINGS: There is an acute fracture deformity involving the proximal left humeral shaft. There is no evidence of dislocation. Degenerative changes are seen involving the left acromioclavicular joint. Soft tissue swelling is seen surrounding the previously noted fracture site. IMPRESSION: Acute fracture of the proximal left humeral shaft. Electronically Signed   By: Aram Candela M.D.   On: 06/01/2023 03:41   DG Humerus Left  Result Date: 06/01/2023 CLINICAL DATA:  Status post fall. EXAM: LEFT HUMERUS - 2+ VIEW COMPARISON:  None Available. FINDINGS: There  is an acute fracture of the proximal left humeral shaft. Approximately 1-2 shaft width medial displacement of the distal fracture site is seen. There is no evidence of dislocation. Moderate severity soft tissue swelling is seen along the previously noted fracture site. IMPRESSION: Acute fracture of the proximal left humeral shaft. Electronically Signed   By: Aram Candela M.D.   On: 06/01/2023 03:40   DG Chest 2 View  Result Date: 06/01/2023 CLINICAL DATA:  Status post fall. EXAM: CHEST - 2 VIEW COMPARISON:  November 13, 2022 FINDINGS: The lateral views limited in evaluation secondary to positioning of the patient's left upper extremity. The heart size and mediastinal contours are within normal limits. Both lungs are clear. An acute, mildly displaced fracture deformity is seen involving the proximal left humeral shaft. Multiple chronic right-sided rib fractures are noted. IMPRESSION: 1. No active cardiopulmonary disease. 2. Acute, mildly displaced fracture of the proximal left humeral shaft. Electronically Signed   By: Aram Candela M.D.   On: 06/01/2023 03:39    Pending Labs Unresulted Labs (From admission, onward)    None       Vitals/Pain Today's Vitals   06/01/23 1045 06/01/23 1108 06/01/23 1200 06/01/23 1230  BP:   133/83 (!) 146/83  Pulse: 94  88 86  Resp: 17  (!) 21 17  Temp:  98.4 F (36.9 C)    TempSrc:      SpO2: 93%  91% 92%  PainSc:        Isolation Precautions No active isolations  Medications Medications  acetaminophen (TYLENOL) tablet 650 mg (has no administration in time range)    Or  acetaminophen (TYLENOL) suppository 650 mg (has no administration in time range)  buPROPion (WELLBUTRIN) tablet 75 mg (75 mg Oral Given 06/01/23 0933)  montelukast (SINGULAIR) tablet 10 mg (has no administration in time range)  labetalol (NORMODYNE) injection 10 mg (has no administration in time range)  HYDROcodone-acetaminophen (NORCO/VICODIN) 5-325 MG per tablet 1 tablet  (has no administration in time range)  albuterol (PROVENTIL) (2.5 MG/3ML) 0.083% nebulizer solution 2.5 mg (has no administration in time range)  mometasone-formoterol (DULERA) 100-5 MCG/ACT inhaler 2 puff (2 puffs Inhalation Not Given 06/01/23 0736)  umeclidinium bromide (INCRUSE ELLIPTA) 62.5 MCG/ACT 1 puff (1 puff Inhalation Not Given 06/01/23 0736)  polyethylene glycol (MIRALAX / GLYCOLAX) packet 17 g (17 g Oral Given 06/01/23 0932)  potassium chloride SA (KLOR-CON M) CR tablet 40 mEq (40 mEq Oral Given 06/01/23 0517)  potassium chloride SA (KLOR-CON M) CR tablet 40 mEq (40 mEq Oral Given 06/01/23 0738)    Mobility non-ambulatory

## 2023-06-02 DIAGNOSIS — W19XXXA Unspecified fall, initial encounter: Secondary | ICD-10-CM | POA: Diagnosis not present

## 2023-06-02 DIAGNOSIS — I629 Nontraumatic intracranial hemorrhage, unspecified: Secondary | ICD-10-CM | POA: Diagnosis not present

## 2023-06-02 DIAGNOSIS — S42302A Unspecified fracture of shaft of humerus, left arm, initial encounter for closed fracture: Secondary | ICD-10-CM | POA: Diagnosis not present

## 2023-06-02 DIAGNOSIS — E876 Hypokalemia: Secondary | ICD-10-CM | POA: Diagnosis not present

## 2023-06-02 DIAGNOSIS — S0631AA Contusion and laceration of right cerebrum with loss of consciousness status unknown, initial encounter: Secondary | ICD-10-CM | POA: Diagnosis not present

## 2023-06-02 NOTE — Progress Notes (Addendum)
Physical Therapy Treatment Patient Details Name: Penny [name] MRN: [id number] DOB: 1933/05/03 Today's Date: 06/02/2023   History of Present Illness 89 yo female admitted with ICH, L humerus fx after falling at home. Hx of falls, COPD, depression, COVID, osteoporosis, lung ca    PT Comments    RN reported she had difficulty mobilizing pt onto bsc. As I walked by room, observed urine running over onto floor. Made RN aware before I entered room to assist pt. While I was in the room, decided to assess pt's ability to get back to recliner. Pt was less steady, with poor trunk posture, wide BOS and heavy reliance on therapist's handheld assist vs recliner armrest once close enough to reach and use to steady while pivoting around. High fall risk. Poor performance compared to earlier session ~30 minutes prior. Pt also appeared to have more trouble mobilizing her L side (with steps, with scooting hips). Mentioned to RN that they could consider using STEDY for transfers if needed (for increased safety and to continue to allow pt to mobilize instead of remaining in bed on purewick). Continue to recommend post acute rehab after this hospital stay.     Recommendations for follow up therapy are one component of a multi-disciplinary discharge planning process, led by the attending physician.  Recommendations may be updated based on patient status, additional functional criteria and insurance authorization.  Follow Up Recommendations  Can patient physically be transported by private vehicle: No    Assistance Recommended at Discharge Frequent or constant Supervision/Assistance  Patient can return home with the following A lot of help with walking and/or transfers;A lot of help with bathing/dressing/bathroom;Assistance with cooking/housework;Assist for transportation;Help with stairs or ramp for entrance   Equipment Recommendations   (TBD at next venue)    Recommendations for Other Services OT  consult     Precautions / Restrictions Precautions Precautions: Fall Precaution Comments: high fall risk Required Braces or Orthoses: Splint/Cast Splint/Cast: L UE Restrictions Weight Bearing Restrictions: Yes LUE Weight Bearing: Non weight bearing     Mobility  Bed Mobility Overal bed mobility: Needs Assistance Bed Mobility: Supine to Sit              Transfers Overall transfer level: Needs assistance Equipment used: 1 person hand held assist Transfers: Sit to/from Stand Sit to Stand: Mod assist           General transfer comment: Assist to power up, stabilize, control descent. Cues for safety, technique, hand/feet placement. Remains at risk for falls.    Ambulation/Gait Ambulation/Gait assistance: Mod assist;+2 safety/equipment Gait Distance (Feet): 3 Feet Assistive device: 1 person hand held assist Gait Pattern/deviations: Step-through pattern, Decreased stride length, Wide base of support, Trunk flexed       General Gait Details: Cues for safety, technique, posture. Assist to stabilize pt throughout. High risk for falls. Pt also fearful of falling. Poor performance this time compared to last session.   Stairs             Wheelchair Mobility    Modified Rankin (Stroke Patients Only)       Balance Overall balance assessment: Needs assistance, History of Falls         Standing balance support: Single extremity supported, During functional activity Standing balance-Leahy Scale: Poor                              Cognition Arousal/Alertness: Awake/alert Behavior During Therapy: WFL for tasks  assessed/performed   Area of Impairment: Problem solving, Following commands                       Following Commands: Follows one step commands with increased time, Follows one step commands inconsistently     Problem Solving: Requires tactile cues, Requires verbal cues, Difficulty sequencing General Comments: can be easily  distracted at times        Exercises      General Comments        Pertinent Vitals/Pain Pain Assessment Pain Assessment: Faces Pain Score: 5  Faces Pain Scale: Hurts little more Pain Location: L UE, back, buttocks Pain Descriptors / Indicators: Discomfort, Aching Pain Intervention(s): Limited activity within patient's tolerance, Monitored during session, Repositioned    Home Living Family/patient expects to be discharged to:: Unsure Living Arrangements: Children Available Help at Discharge: Family;Available PRN/intermittently Type of Home: House Home Access: Stairs to enter Entrance Stairs-Rails: None Entrance Stairs-Number of Steps: 2   Home Layout: Two level;Able to live on main level with bedroom/bathroom Home Equipment: Rolling Walker (2 wheels);Cane - single point      Prior Function            PT Goals (current goals can now be found in the care plan section) Acute Rehab PT Goals Patient Stated Goal: to get better PT Goal Formulation: With patient/family Time For Goal Achievement: 06/16/23 Potential to Achieve Goals: Good Progress towards PT goals: Progressing toward goals    Frequency    Min 1X/week      PT Plan Current plan remains appropriate    Co-evaluation              AM-PAC PT "6 Clicks" Mobility   Outcome Measure  Help needed turning from your back to your side while in a flat bed without using bedrails?: A Lot Help needed moving from lying on your back to sitting on the side of a flat bed without using bedrails?: A Lot Help needed moving to and from a bed to a chair (including a wheelchair)?: A Lot Help needed standing up from a chair using your arms (e.g., wheelchair or bedside chair)?: A Lot Help needed to walk in hospital room?: A Lot Help needed climbing 3-5 steps with a railing? : Total 6 Click Score: 11    End of Session Equipment Utilized During Treatment: Gait belt Activity Tolerance: Patient limited by  fatigue Patient left: in chair;with call bell/phone within reach;with chair alarm set;with family/visitor present   PT Visit Diagnosis: Pain;History of falling (Z91.81);Repeated falls (R29.6);Difficulty in walking, not elsewhere classified (R26.2) Pain - Right/Left: Left Pain - part of body: Arm (back)     Time: 1610-9604 PT Time Calculation (min) (ACUTE ONLY): 12 min  Charges:  $Gait Training: 8-22 mins $Therapeutic Activity: 8-22 mins                         Faye Ramsay, PT Acute Rehabilitation  Office: [phone]

## 2023-06-02 NOTE — Evaluation (Signed)
Physical Therapy Evaluation Patient Details Name: Annay Benak MRN: [id number] DOB: [birth year] Today's Date: 06/02/2023  History of Present Illness  89 yo female admitted with ICH, L humerus fx after falling at home. Hx of falls, COPD, depression, COVID, osteoporosis, lung ca  Clinical Impression  On eval, pt required Min A +2 for mobility. She was able to walk ~5 feet with 1 HHA on R and steadying with gait belt on L side. Total assist to reposition L UE sling. Pt presents with general weakness, decreased activity tolerance, and impaired gait and balance. She remains at high risk for falls. Pt will benefit from continued rehab after this hospital stay.        Recommendations for follow up therapy are one component of a multi-disciplinary discharge planning process, led by the attending physician.  Recommendations may be updated based on patient status, additional functional criteria and insurance authorization.  Follow Up Recommendations Can patient physically be transported by private vehicle: No     Assistance Recommended at Discharge Frequent or constant Supervision/Assistance  Patient can return home with the following  A lot of help with walking and/or transfers;A lot of help with bathing/dressing/bathroom;Assistance with cooking/housework;Assist for transportation;Help with stairs or ramp for entrance    Equipment Recommendations  (TBD at next venue)  Recommendations for Other Services  OT consult    Functional Status Assessment Patient has had a recent decline in their functional status and demonstrates the ability to make significant improvements in function in a reasonable and predictable amount of time.     Precautions / Restrictions Precautions Precautions: Fall Precaution Comments: high fall risk Required Braces or Orthoses: Splint/Cast Splint/Cast: L UE Restrictions Weight Bearing Restrictions: Yes LUE Weight Bearing: Non weight bearing      Mobility  Bed  Mobility Overal bed mobility: Needs Assistance Bed Mobility: Supine to Sit     Supine to sit: Mod assist, HOB elevated     General bed mobility comments: Assist for bil LEs and for trunk. Pt used bedrail. Cues required. Increased time.    Transfers Overall transfer level: Needs assistance Equipment used: 1 person hand held assist Transfers: Sit to/from Stand Sit to Stand: Min assist, +2 physical assistance, +2 safety/equipment           General transfer comment: Assist to power up, stabilize, control descent. Cues for safety, technique, hand/feet placement. Remains at risk for falls.    Ambulation/Gait Ambulation/Gait assistance: Min assist, +2 physical assistance, +2 safety/equipment Gait Distance (Feet): 5 Feet Assistive device: 1 person hand held assist Gait Pattern/deviations: Step-through pattern, Decreased stride length       General Gait Details: Cues for safety, technique, posture. Assist to stabilize pt throughout. Followed closely with recliner. Remains at risk for falls  Stairs            Wheelchair Mobility    Modified Rankin (Stroke Patients Only)       Balance Overall balance assessment: Needs assistance, History of Falls         Standing balance support: Single extremity supported, During functional activity Standing balance-Leahy Scale: Poor                               Pertinent Vitals/Pain Pain Assessment Pain Assessment: 0-10 Pain Score: 5  Pain Location: L UE, back, buttocks Pain Descriptors / Indicators: Discomfort, Aching Pain Intervention(s): Limited activity within patient's tolerance, Monitored during session, Repositioned    Home Living  Family/patient expects to be discharged to:: Unsure Living Arrangements: Children Available Help at Discharge: Family;Available PRN/intermittently Type of Home: House Home Access: Stairs to enter Entrance Stairs-Rails: None Entrance Stairs-Number of Steps: 2   Home  Layout: Two level;Able to live on main level with bedroom/bathroom Home Equipment: Rolling Walker (2 wheels);Cane - single point      Prior Function Prior Level of Function : Independent/Modified Independent             Mobility Comments: using walker ADLs Comments: gets into shower with supervision. dressing with mod ind.     Hand Dominance   Dominant Hand: Right    Extremity/Trunk Assessment   Upper Extremity Assessment Upper Extremity Assessment: Defer to OT evaluation    Lower Extremity Assessment Lower Extremity Assessment: Generalized weakness;LLE deficits/detail LLE Deficits / Details: strength >3/5. pt able to weight bear. however, possibly having some issues with coordination    Cervical / Trunk Assessment Cervical / Trunk Assessment: Normal  Communication   Communication: No difficulties  Cognition Arousal/Alertness: Awake/alert Behavior During Therapy: WFL for tasks assessed/performed   Area of Impairment: Problem solving, Following commands                       Following Commands: Follows one step commands consistently     Problem Solving: Requires tactile cues, Requires verbal cues, Difficulty sequencing General Comments: can be easily distracted at times        General Comments      Exercises     Assessment/Plan    PT Assessment Patient needs continued PT services  PT Problem List Decreased strength;Decreased range of motion;Decreased activity tolerance;Decreased balance;Decreased mobility;Decreased knowledge of use of DME;Pain       PT Treatment Interventions DME instruction;Therapeutic exercise;Gait training;Balance training;Functional mobility training;Therapeutic activities;Patient/family education    PT Goals (Current goals can be found in the Care Plan section)  Acute Rehab PT Goals Patient Stated Goal: to get better PT Goal Formulation: With patient/family Time For Goal Achievement: 06/16/23 Potential to Achieve Goals:  Good    Frequency Min 1X/week     Co-evaluation               AM-PAC PT "6 Clicks" Mobility  Outcome Measure Help needed turning from your back to your side while in a flat bed without using bedrails?: A Lot Help needed moving from lying on your back to sitting on the side of a flat bed without using bedrails?: A Lot Help needed moving to and from a bed to a chair (including a wheelchair)?: A Lot Help needed standing up from a chair using your arms (e.g., wheelchair or bedside chair)?: A Lot Help needed to walk in hospital room?: A Lot Help needed climbing 3-5 steps with a railing? : Total 6 Click Score: 11    End of Session Equipment Utilized During Treatment: Gait belt Activity Tolerance: Patient limited by fatigue Patient left: in chair;with call bell/phone within reach;with chair alarm set;with family/visitor present   PT Visit Diagnosis: Pain;History of falling (Z91.81);Repeated falls (R29.6);Difficulty in walking, not elsewhere classified (R26.2) Pain - Right/Left: Left Pain - part of body: Arm (back)    Time: 1610-9604 PT Time Calculation (min) (ACUTE ONLY): 28 min   Charges:   PT Evaluation $PT Eval Low Complexity: 1 Low PT Treatments $Gait Training: 8-22 mins          Faye Ramsay, PT Acute Rehabilitation  Office: [phone]

## 2023-06-02 NOTE — Progress Notes (Signed)
[facility] PROGRESS NOTE    Progress Note  Penny [name]  ZOX:[id number] DOB: [birth year] DOA: 06/01/2023 PCP: Lucky Cowboy, MD     Brief Narrative:   Penny [name] is an 89 y.o. female past medical history significant for COPD, allergic rhinitis depression admitted to hospital hospital for traumatic intracranial hemorrhage CT of the head showed punctuated foci of acute intraparenchymal hemorrhage within the right frontal cortex without any midline shift cervical C-spine showed no evidence of acute fracture or subluxation.  The case was discussed with neurosurgery Dr. Conchita Paris who recommended repeated CT.   Assessment/Plan:   Traumatic intracranial hemorrhage : Neurosurgery was curb sided recommended to repeat a CT of the head that was unchanged from previous evidence of 3 mm hypodense focal over the right frontal lobe unchanged in size. Continue neurochecks regularly. Continue IV labetalol for blood pressure greater than 160 continue to hold antiplatelet therapy.  Acute left proximal humeral fracture: Show acute left humeral fracture shaft, case discussed with orthopedic surgery [facility] who recommended splint for immobilization. An outpatient follow-up with Dr. Aundria Rud continue analgesics for pain control. Continue analgesics for pain control, started on MiraLAX p.o. twice daily  Mechanical fall: Awaiting PT OT evaluation, anticipate skilled nursing facility placement.  Hypokalemia Repleted and improved.  COPD exacerbation: Continue inhalers no changes made.  Allergic rhinitis: Continue Zyrtec.   DVT prophylaxis: SCD Family Communication:none Status is: Observation The patient remains OBS appropriate and will d/c before 2 midnights.    Code Status:     Code Status Orders  (From admission, onward)           Start     Ordered   06/01/23 0506  Full code  Continuous       Question:  By:  Answer:  Consent: discussion documented in EHR    06/01/23 0505           Code Status History     Date Active Date Inactive Code Status Order ID Comments User Context   09/10/2022 2142 09/13/2022 1804 Full Code [id number]  Vonna Drafts, MD ED   10/20/2014 2036 10/22/2014 2113 Full Code [id number]  Abigail Miyamoto, MD Inpatient         IV Access:   Peripheral IV   Procedures and diagnostic studies:   CT HEAD WO CONTRAST ( )  Result Date: 06/01/2023 CLINICAL DATA:  Follow-up hemorrhage. EXAM: CT HEAD WITHOUT CONTRAST TECHNIQUE: Contiguous axial images were obtained from the base of the skull through the vertex without intravenous contrast. RADIATION DOSE REDUCTION: This exam was performed according to the departmental dose-optimization program which includes automated exposure control, adjustment of the mA and/or kV according to patient size and/or use of iterative reconstruction technique. COMPARISON:  06/01/2023 at 3:16 a.m. and 11/13/2022. FINDINGS: Brain: Examination demonstrates continued evidence of 3 mm hypodense focus over the right frontal region which may be parenchymal versus subarachnoid in location. This is unchanged in size, but slightly less dense compared to the recent prior study. No additional sites of hemorrhage. Remainder of the exam is unchanged to include moderate chronic ischemic microvascular disease. Vascular: No hyperdense vessel or unexpected calcification. Skull: Normal. Negative for fracture or focal lesion. Sinuses/Orbits: No acute finding. Other: None. IMPRESSION: 1. Continued evidence of 3 mm hypodense focus over the right frontal region which may be parenchymal versus subarachnoid in location. This is unchanged in size, but slightly less dense compared to the recent prior study. No additional sites of hemorrhage. 2. Moderate chronic ischemic microvascular disease. Electronically  Signed   By: Elberta Fortis M.D.   On: 06/01/2023 11:48   CT Head Wo Contrast  Result Date: 06/01/2023 CLINICAL DATA:  Head  trauma, moderate-severe; Neck trauma (Age >= 65y), Unwitnessed fall EXAM: CT HEAD WITHOUT CONTRAST CT CERVICAL SPINE WITHOUT CONTRAST TECHNIQUE: Multidetector CT imaging of the head and cervical spine was performed following the standard protocol without intravenous contrast. Multiplanar CT image reconstructions of the cervical spine were also generated. RADIATION DOSE REDUCTION: This exam was performed according to the departmental dose-optimization program which includes automated exposure control, adjustment of the mA and/or kV according to patient size and/or use of iterative reconstruction technique. COMPARISON:  11/13/2022 FINDINGS: CT HEAD FINDINGS Brain: A punctate focus of acute intraparenchymal hemorrhage is seen within the right frontal cortex measuring 3 mm in diameter at axial image # 27/3. No associated abnormal mass effect. No midline shift. Mild parenchymal volume loss is commensurate with the patient's age. Extensive periventricular and subcortical white matter changes are again identified, stable since prior examination, possibly reflecting sequela of [place] vessel ischemia though demyelinating disease or prior radiation therapy could appear similarly. No acute infarct. No abnormal intra or extra-axial mass lesion. Ventricular size is normal. Cerebellum is unremarkable. Vascular: No hyperdense vessel or unexpected calcification. Skull: Normal. Negative for fracture or focal lesion. Sinuses/Orbits: No acute finding. Other: Mastoid air cells and middle ear cavities are clear. CT CERVICAL SPINE FINDINGS Alignment: There is straightening of the cervical spine. Stable 2 mm anterolisthesis C3-4 and 2 mm retrolisthesis C6-7. No acute traumatic listhesis identified. Skull base and vertebrae: Craniocervical alignment is normal. The atlantodental interval is not widened. No acute fracture of the cervical spine. Vertebral body height is preserved. Soft tissues and spinal canal: No prevertebral fluid or  swelling. No visible canal hematoma. Disc levels: There is intervertebral disc space narrowing, endplate remodeling, and disc calcification throughout the cervical spine in keeping with changes of diffuse advanced degenerative disc disease. Multilevel uncovertebral and facet arthrosis results in multilevel moderate to severe neuroforaminal narrowing, most severe on the left at C4-5. Prevertebral soft tissues are not thickened on sagittal reformats. No high-grade canal stenosis. Upper chest: Negative. Other: None IMPRESSION: 1. Punctate focus of acute intraparenchymal hemorrhage within the right frontal cortex. No associated abnormal mass effect. No midline shift. 2. No acute fracture or listhesis of the cervical spine. 3. Advanced degenerative disc and degenerative joint disease resulting in multilevel moderate to severe neuroforaminal narrowing, most severe on the left at C4-5. These results were called by telephone at the time of interpretation on 06/01/2023 at 3:50 am to provider [facility] , who verbally acknowledged these results. Electronically Signed   By: Helyn Numbers M.D.   On: 06/01/2023 03:50   CT Cervical Spine Wo Contrast  Result Date: 06/01/2023 CLINICAL DATA:  Head trauma, moderate-severe; Neck trauma (Age >= 65y), Unwitnessed fall EXAM: CT HEAD WITHOUT CONTRAST CT CERVICAL SPINE WITHOUT CONTRAST TECHNIQUE: Multidetector CT imaging of the head and cervical spine was performed following the standard protocol without intravenous contrast. Multiplanar CT image reconstructions of the cervical spine were also generated. RADIATION DOSE REDUCTION: This exam was performed according to the departmental dose-optimization program which includes automated exposure control, adjustment of the mA and/or kV according to patient size and/or use of iterative reconstruction technique. COMPARISON:  11/13/2022 FINDINGS: CT HEAD FINDINGS Brain: A punctate focus of acute intraparenchymal hemorrhage is seen within  the right frontal cortex measuring 3 mm in diameter at axial image # 27/3. No associated abnormal mass  effect. No midline shift. Mild parenchymal volume loss is commensurate with the patient's age. Extensive periventricular and subcortical white matter changes are again identified, stable since prior examination, possibly reflecting sequela of [place] vessel ischemia though demyelinating disease or prior radiation therapy could appear similarly. No acute infarct. No abnormal intra or extra-axial mass lesion. Ventricular size is normal. Cerebellum is unremarkable. Vascular: No hyperdense vessel or unexpected calcification. Skull: Normal. Negative for fracture or focal lesion. Sinuses/Orbits: No acute finding. Other: Mastoid air cells and middle ear cavities are clear. CT CERVICAL SPINE FINDINGS Alignment: There is straightening of the cervical spine. Stable 2 mm anterolisthesis C3-4 and 2 mm retrolisthesis C6-7. No acute traumatic listhesis identified. Skull base and vertebrae: Craniocervical alignment is normal. The atlantodental interval is not widened. No acute fracture of the cervical spine. Vertebral body height is preserved. Soft tissues and spinal canal: No prevertebral fluid or swelling. No visible canal hematoma. Disc levels: There is intervertebral disc space narrowing, endplate remodeling, and disc calcification throughout the cervical spine in keeping with changes of diffuse advanced degenerative disc disease. Multilevel uncovertebral and facet arthrosis results in multilevel moderate to severe neuroforaminal narrowing, most severe on the left at C4-5. Prevertebral soft tissues are not thickened on sagittal reformats. No high-grade canal stenosis. Upper chest: Negative. Other: None IMPRESSION: 1. Punctate focus of acute intraparenchymal hemorrhage within the right frontal cortex. No associated abnormal mass effect. No midline shift. 2. No acute fracture or listhesis of the cervical spine. 3. Advanced  degenerative disc and degenerative joint disease resulting in multilevel moderate to severe neuroforaminal narrowing, most severe on the left at C4-5. These results were called by telephone at the time of interpretation on 06/01/2023 at 3:50 am to provider [facility] , who verbally acknowledged these results. Electronically Signed   By: Helyn Numbers M.D.   On: 06/01/2023 03:50   DG Pelvis 1-2 Views  Result Date: 06/01/2023 CLINICAL DATA:  Status post fall. EXAM: PELVIS - 1-2 VIEW COMPARISON:  None Available. FINDINGS: There is no evidence of an acute pelvic fracture or diastasis. No pelvic bone lesions are seen. Moderate severity degenerative changes seen involving both hips, in the form of joint space narrowing and acetabular sclerosis. IMPRESSION: Moderate severity degenerative changes involving both hips. Electronically Signed   By: Aram Candela M.D.   On: 06/01/2023 03:45   DG Knee Complete 4 Views Left  Result Date: 06/01/2023 CLINICAL DATA:  Status post fall. EXAM: LEFT KNEE - COMPLETE 4+ VIEW COMPARISON:  None Available. FINDINGS: No evidence of fracture, dislocation, or joint effusion. Marked severity medial and lateral chondrocalcinosis is seen. Soft tissues are unremarkable. IMPRESSION: Marked severity chondrocalcinosis. Electronically Signed   By: Aram Candela M.D.   On: 06/01/2023 03:44   DG Wrist Complete Left  Result Date: 06/01/2023 CLINICAL DATA:  Status post fall. EXAM: LEFT WRIST - COMPLETE 3+ VIEW COMPARISON:  None Available. FINDINGS: There is no evidence of an acute fracture or dislocation. Marked severity degenerative changes are seen along the distal aspect of the left wrist, most prominent along the first carpometacarpal joint. Soft tissues are unremarkable. IMPRESSION: Marked severity degenerative changes without an acute fracture or dislocation. Electronically Signed   By: Aram Candela M.D.   On: 06/01/2023 03:43   DG Elbow Complete Left  Result Date:  06/01/2023 CLINICAL DATA:  Status post fall. EXAM: LEFT ELBOW - COMPLETE 3+ VIEW COMPARISON:  None Available. FINDINGS: There is an acute fracture deformity involving the visualized portion of the proximal left  humeral shaft. There is no evidence of dislocation. There is no evidence of arthropathy or other focal bone abnormality. Soft tissues are unremarkable. IMPRESSION: Acute fracture of the proximal left humeral shaft. Electronically Signed   By: Aram Candela M.D.   On: 06/01/2023 03:42   DG Shoulder Left  Result Date: 06/01/2023 CLINICAL DATA:  Status post fall. EXAM: LEFT SHOULDER - 2+ VIEW COMPARISON:  None Available. FINDINGS: There is an acute fracture deformity involving the proximal left humeral shaft. There is no evidence of dislocation. Degenerative changes are seen involving the left acromioclavicular joint. Soft tissue swelling is seen surrounding the previously noted fracture site. IMPRESSION: Acute fracture of the proximal left humeral shaft. Electronically Signed   By: Aram Candela M.D.   On: 06/01/2023 03:41   DG Humerus Left  Result Date: 06/01/2023 CLINICAL DATA:  Status post fall. EXAM: LEFT HUMERUS - 2+ VIEW COMPARISON:  None Available. FINDINGS: There is an acute fracture of the proximal left humeral shaft. Approximately 1-2 shaft width medial displacement of the distal fracture site is seen. There is no evidence of dislocation. Moderate severity soft tissue swelling is seen along the previously noted fracture site. IMPRESSION: Acute fracture of the proximal left humeral shaft. Electronically Signed   By: Aram Candela M.D.   On: 06/01/2023 03:40   DG Chest 2 View  Result Date: 06/01/2023 CLINICAL DATA:  Status post fall. EXAM: CHEST - 2 VIEW COMPARISON:  November 13, 2022 FINDINGS: The lateral views limited in evaluation secondary to positioning of the patient's left upper extremity. The heart size and mediastinal contours are within normal limits. Both lungs are  clear. An acute, mildly displaced fracture deformity is seen involving the proximal left humeral shaft. Multiple chronic right-sided rib fractures are noted. IMPRESSION: 1. No active cardiopulmonary disease. 2. Acute, mildly displaced fracture of the proximal left humeral shaft. Electronically Signed   By: Aram Candela M.D.   On: 06/01/2023 03:39     Medical Consultants:   None.   Subjective:    Penny [name] no complaints, has not had a bowel movement. Objective:    Vitals:   06/01/23 2300 06/02/23 0304 06/02/23 0840 06/02/23 0922  BP: (!) 149/64 (!) 143/85    Pulse: 99 95    Resp:  18    Temp: 98 F (36.7 C) 98.3 F (36.8 C)    TempSrc: Oral Oral    SpO2: 95% 93% 95% 95%  Weight:      Height:       SpO2: 95 %   Intake/Output Summary (Last 24 hours) at 06/02/2023 0926 Last data filed at 06/02/2023 0600 Gross per 24 hour  Intake 220 ml  Output 451 ml  Net -231 ml   Filed Weights   06/01/23 1838  Weight: 64.7 kg    Exam: General exam: In no acute distress. Respiratory system: Good air movement and clear to auscultation. Cardiovascular system: S1 & S2 heard, RRR. No JVD. Gastrointestinal system: Abdomen is nondistended, soft and nontender.  Extremities: No pedal edema. Skin: No rashes, lesions or ulcers Psychiatry: Judgement and insight appear normal. Mood & affect appropriate. Data Reviewed:    Labs: Basic Metabolic Panel: Recent Labs  Lab 06/01/23 0239 06/01/23 0938  NA 137 139  K 2.9* 3.7  CL 104 106  CO2 24 25  GLUCOSE 172* 130*  BUN 13 11  CREATININE 0.71 0.62  CALCIUM 9.1 8.7*  MG 1.5*  --     GFR Estimated Creatinine Clearance: 43.7  mL/min (by C-G formula based on SCr of 0.62 mg/dL). Liver Function Tests: Recent Labs  Lab 06/01/23 0938  AST 24  ALT 15  ALKPHOS 60  BILITOT 0.8  PROT 6.4*  ALBUMIN 3.6   No results for input(s): "LIPASE", "AMYLASE" in the last 168 hours. No results for input(s): "AMMONIA" in the last 168  hours. Coagulation profile Recent Labs  Lab 06/01/23 0239  INR 1.0   COVID-19 Labs  No results for input(s): "DDIMER", "FERRITIN", "LDH", "CRP" in the last 72 hours.  No results found for: "SARSCOV2NAA"  CBC: Recent Labs  Lab 06/01/23 0239 06/01/23 0938  WBC 10.0 6.7  NEUTROABS 8.5* 4.8  HGB 11.9* 11.6*  HCT 38.1 36.8  MCV 87.4 87.2  PLT 183 194    Cardiac Enzymes: Recent Labs  Lab 06/01/23 0239  CKTOTAL 224    BNP (last 3 results) No results for input(s): "PROBNP" in the last 8760 hours. CBG: No results for input(s): "GLUCAP" in the last 168 hours. D-Dimer: No results for input(s): "DDIMER" in the last 72 hours. Hgb A1c: No results for input(s): "HGBA1C" in the last 72 hours. Lipid Profile: No results for input(s): "CHOL", "HDL", "LDLCALC", "TRIG", "CHOLHDL", "LDLDIRECT" in the last 72 hours. Thyroid function studies: No results for input(s): "TSH", "T4TOTAL", "T3FREE", "THYROIDAB" in the last 72 hours.  Invalid input(s): "FREET3" Anemia work up: No results for input(s): "VITAMINB12", "FOLATE", "FERRITIN", "TIBC", "IRON", "RETICCTPCT" in the last 72 hours. Sepsis Labs: Recent Labs  Lab 06/01/23 0239 06/01/23 0938  WBC 10.0 6.7    Microbiology No results found for this or any previous visit (from the past 240 hour(s)).   Medications:    buPROPion  75 mg Oral Daily   mometasone-formoterol  2 puff Inhalation BID   montelukast  10 mg Oral QHS   polyethylene glycol  17 g Oral BID   umeclidinium bromide  1 puff Inhalation Daily   Continuous Infusions:    LOS: 0 days   Marinda Elk  [facility]  06/02/2023, 9:26 AM

## 2023-06-03 DIAGNOSIS — E876 Hypokalemia: Secondary | ICD-10-CM | POA: Diagnosis not present

## 2023-06-03 DIAGNOSIS — S42302A Unspecified fracture of shaft of humerus, left arm, initial encounter for closed fracture: Secondary | ICD-10-CM | POA: Diagnosis not present

## 2023-06-03 DIAGNOSIS — S0631AA Contusion and laceration of right cerebrum with loss of consciousness status unknown, initial encounter: Secondary | ICD-10-CM | POA: Diagnosis not present

## 2023-06-03 DIAGNOSIS — I629 Nontraumatic intracranial hemorrhage, unspecified: Secondary | ICD-10-CM | POA: Diagnosis not present

## 2023-06-03 DIAGNOSIS — W19XXXA Unspecified fall, initial encounter: Secondary | ICD-10-CM | POA: Diagnosis not present

## 2023-06-03 MED ORDER — POLYETHYLENE GLYCOL 3350 17 G PO PACK
17.0000 g | PACK | Freq: Two times a day (BID) | ORAL | Status: AC
  Administered 2023-06-03 – 2023-06-04 (×2): 17 g via ORAL
  Filled 2023-06-03 (×2): qty 1

## 2023-06-03 NOTE — Progress Notes (Signed)
[facility] PROGRESS NOTE    Progress Note  Fransisca Burningham [name]  WUJ:[id number] DOB: [birth year] DOA: 06/01/2023 PCP: Lucky Cowboy, MD     Brief Narrative:   Penny [name] is an 89 y.o. female past medical history significant for COPD, allergic rhinitis depression admitted to hospital hospital for traumatic intracranial hemorrhage CT of the head showed punctuated foci of acute intraparenchymal hemorrhage within the right frontal cortex without any midline shift cervical C-spine showed no evidence of acute fracture or subluxation.  The case was discussed with neurosurgery Dr. Conchita Paris who recommended repeated CT. repeated CT scan shows stable hemorrhage no midline shift.   Assessment/Plan:   Traumatic intracranial hemorrhage : Neurosurgery was curb sided recommended to repeat a CT of the head that was unchanged Continue IV labetalol for blood pressure greater than 160 continue to hold antiplatelet therapy.  Acute left proximal humeral fracture: Imaging show acute left humeral fracture shaft, case discussed with orthopedic surgery [facility] who recommended splint for immobilization. An outpatient follow-up with Dr. Aundria Rud continue analgesics for pain control. Continue analgesics for pain control, started on MiraLAX p.o. twice daily  Mechanical fall: OT evaluated the patient, will need skilled nursing facility placement. Consult TOC.  Hypokalemia: Repleted and improved.  COPD exacerbation: Continue inhalers no changes made.  Allergic rhinitis: Continue Zyrtec.   DVT prophylaxis: SCD Family Communication:none Status is: Observation The patient remains OBS appropriate and will d/c before 2 midnights.    Code Status:     Code Status Orders  (From admission, onward)           Start     Ordered   06/01/23 0506  Full code  Continuous       Question:  By:  Answer:  Consent: discussion documented in EHR   06/01/23 0505           Code Status  History     Date Active Date Inactive Code Status Order ID Comments User Context   09/10/2022 2142 09/13/2022 1804 Full Code [id number]  Vonna Drafts, MD ED   10/20/2014 2036 10/22/2014 2113 Full Code [id number]  Abigail Miyamoto, MD Inpatient         IV Access:   Peripheral IV   Procedures and diagnostic studies:   CT HEAD WO CONTRAST ( )  Result Date: 06/01/2023 CLINICAL DATA:  Follow-up hemorrhage. EXAM: CT HEAD WITHOUT CONTRAST TECHNIQUE: Contiguous axial images were obtained from the base of the skull through the vertex without intravenous contrast. RADIATION DOSE REDUCTION: This exam was performed according to the departmental dose-optimization program which includes automated exposure control, adjustment of the mA and/or kV according to patient size and/or use of iterative reconstruction technique. COMPARISON:  06/01/2023 at 3:16 a.m. and 11/13/2022. FINDINGS: Brain: Examination demonstrates continued evidence of 3 mm hypodense focus over the right frontal region which may be parenchymal versus subarachnoid in location. This is unchanged in size, but slightly less dense compared to the recent prior study. No additional sites of hemorrhage. Remainder of the exam is unchanged to include moderate chronic ischemic microvascular disease. Vascular: No hyperdense vessel or unexpected calcification. Skull: Normal. Negative for fracture or focal lesion. Sinuses/Orbits: No acute finding. Other: None. IMPRESSION: 1. Continued evidence of 3 mm hypodense focus over the right frontal region which may be parenchymal versus subarachnoid in location. This is unchanged in size, but slightly less dense compared to the recent prior study. No additional sites of hemorrhage. 2. Moderate chronic ischemic microvascular disease. Electronically Signed   By: Reuel Boom  Micheline Maze M.D.   On: 06/01/2023 11:48     Medical Consultants:   None.   Subjective:    [name] relates her arm is hurting, when  she gets the pain medication controls her pain. Objective:    Vitals:   06/03/23 0359 06/03/23 0451 06/03/23 0809 06/03/23 0902  BP: 137/84   (!) 144/78  Pulse: (!) 102   95  Resp: 18   20  Temp: 97.6 F (36.4 C)   97.7 F (36.5 C)  TempSrc: Oral   Oral  SpO2: 94%  99% 98%  Weight:  60.8 kg    Height:       SpO2: 98 %   Intake/Output Summary (Last 24 hours) at 06/03/2023 1021 Last data filed at 06/03/2023 0902 Gross per 24 hour  Intake 220 ml  Output 800 ml  Net -580 ml    Filed Weights   06/01/23 1838 06/03/23 0451  Weight: 64.7 kg 60.8 kg    Exam: General exam: In no acute distress. Respiratory system: Good air movement and clear to auscultation. Cardiovascular system: S1 & S2 heard, RRR. No JVD. Gastrointestinal system: Abdomen is nondistended, soft and nontender.  Extremities: No pedal edema. Skin: No rashes, lesions or ulcers Psychiatry: Judgement and insight appear normal. Mood & affect appropriate. Data Reviewed:    Labs: Basic Metabolic Panel: Recent Labs  Lab 06/01/23 0239 06/01/23 0938  NA 137 139  K 2.9* 3.7  CL 104 106  CO2 24 25  GLUCOSE 172* 130*  BUN 13 11  CREATININE 0.71 0.62  CALCIUM 9.1 8.7*  MG 1.5*  --     GFR Estimated Creatinine Clearance: 42.5 mL/min (by C-G formula based on SCr of 0.62 mg/dL). Liver Function Tests: Recent Labs  Lab 06/01/23 0938  AST 24  ALT 15  ALKPHOS 60  BILITOT 0.8  PROT 6.4*  ALBUMIN 3.6    No results for input(s): "LIPASE", "AMYLASE" in the last 168 hours. No results for input(s): "AMMONIA" in the last 168 hours. Coagulation profile Recent Labs  Lab 06/01/23 0239  INR 1.0    COVID-19 Labs  No results for input(s): "DDIMER", "FERRITIN", "LDH", "CRP" in the last 72 hours.  No results found for: "SARSCOV2NAA"  CBC: Recent Labs  Lab 06/01/23 0239 06/01/23 0938  WBC 10.0 6.7  NEUTROABS 8.5* 4.8  HGB 11.9* 11.6*  HCT 38.1 36.8  MCV 87.4 87.2  PLT 183 194    Cardiac  Enzymes: Recent Labs  Lab 06/01/23 0239  CKTOTAL 224    BNP (last 3 results) No results for input(s): "PROBNP" in the last 8760 hours. CBG: No results for input(s): "GLUCAP" in the last 168 hours. D-Dimer: No results for input(s): "DDIMER" in the last 72 hours. Hgb A1c: No results for input(s): "HGBA1C" in the last 72 hours. Lipid Profile: No results for input(s): "CHOL", "HDL", "LDLCALC", "TRIG", "CHOLHDL", "LDLDIRECT" in the last 72 hours. Thyroid function studies: No results for input(s): "TSH", "T4TOTAL", "T3FREE", "THYROIDAB" in the last 72 hours.  Invalid input(s): "FREET3" Anemia work up: No results for input(s): "VITAMINB12", "FOLATE", "FERRITIN", "TIBC", "IRON", "RETICCTPCT" in the last 72 hours. Sepsis Labs: Recent Labs  Lab 06/01/23 0239 06/01/23 0938  WBC 10.0 6.7    Microbiology No results found for this or any previous visit (from the past 240 hour(s)).   Medications:    buPROPion  75 mg Oral Daily   mometasone-formoterol  2 puff Inhalation BID   montelukast  10 mg Oral QHS  umeclidinium bromide  1 puff Inhalation Daily   Continuous Infusions:    LOS: 0 days   Marinda Elk  [facility]  06/03/2023, 10:21 AM

## 2023-06-03 NOTE — Progress Notes (Signed)
Agree with previous nurse shift assessment. 

## 2023-06-03 NOTE — Evaluation (Signed)
Occupational Therapy Evaluation Patient Details Name: Penny [name] MRN: [id number] DOB: 12-14-1932 Today's Date: 06/03/2023   History of Present Illness 89 yo female admitted with ICH, L humerus fx after falling at home. Hx of falls, COPD, depression, COVID, osteoporosis, lung ca   Clinical Impression   The pt is currently presenting well below her baseline level of functioning for self-care management (she reported being independent with ADLs at her baseline), given below listed functional deficits (see OT problem list at end of this note). She is also with new LUE NWB status, LUE pain, and LUE ROM limitations given applied splint and sling. She requires increased assist for self-care tasks, including dressing, bathing, and toileting. During the session today, she was assisted to the bedside chair, requiring constant hands-on assist during out of bed activity. Without further OT services, she is at risk for restricted ADL participation and progressive weakness. Patient will benefit from continued inpatient follow up therapy, <3 hours/day.      Recommendations for follow up therapy are one component of a multi-disciplinary discharge planning process, led by the attending physician.  Recommendations may be updated based on patient status, additional functional criteria and insurance authorization.   Assistance Recommended at Discharge Frequent or constant Supervision/Assistance  Patient can return home with the following A lot of help with bathing/dressing/bathroom;Assistance with cooking/housework;Direct supervision/assist for medications management;Assist for transportation;A little help with walking and/or transfers    Functional Status Assessment  Patient has had a recent decline in their functional status and demonstrates the ability to make significant improvements in function in a reasonable and predictable amount of time.  Equipment Recommendations  Other (comment) (defer to next  level of care)    Recommendations for Other Services       Precautions / Restrictions Precautions Precautions: Fall Splint/Cast: L UE Restrictions Weight Bearing Restrictions: Yes LUE Weight Bearing: Non weight bearing Other Position/Activity Restrictions: LUE sling      Mobility Bed Mobility Overal bed mobility: Needs Assistance Bed Mobility: Supine to Sit     Supine to sit: Mod assist, HOB elevated     General bed mobility comments: Assist for bil LEs and for trunk. Pt used bedrail. Cues required. Increased time.    Transfers Overall transfer level: Needs assistance Equipment used: 1 person hand held assist Transfers: Sit to/from Stand Sit to Stand: From elevated surface, Min assist           General transfer comment: Assist to power up, stabilize. Cues for safety, technique, hand/feet placement.      Balance     Sitting balance-Leahy Scale: Good       Standing balance-Leahy Scale: Poor               ADL either performed or assessed with clinical judgement   ADL Overall ADL's : Needs assistance/impaired Eating/Feeding: Set up;Sitting Eating/Feeding Details (indicate cue type and reason): at chair level Grooming: Minimal assistance;Sitting Grooming Details (indicate cue type and reason): at chair level         Upper Body Dressing : Moderate assistance;Sitting   Lower Body Dressing: Maximal assistance;Sit to/from stand       Toileting- Architect and Hygiene: Moderate assistance;Sit to/from stand Toileting - Clothing Manipulation Details (indicate cue type and reason): at bedside commode level             Vision   Additional Comments: She correctly read the time depicted on the wall clock  Pertinent Vitals/Pain Pain Assessment Pain Assessment: 0-10 Pain Score: 8  Pain Location: LUE only with activity Pain Intervention(s): Limited activity within patient's tolerance, Monitored during session, Ice applied      Hand Dominance Right   Extremity/Trunk Assessment Upper Extremity Assessment Upper Extremity Assessment: LUE deficits/detail;RUE deficits/detail RUE Deficits / Details: AROM WFL. Functional grip strength LUE Deficits / Details: Elbow and shoulder not assessed, given applied sling. Able to form fist   Lower Extremity Assessment Lower Extremity Assessment: Generalized weakness       Communication Communication Communication: No difficulties   Cognition Arousal/Alertness: Awake/alert Behavior During Therapy: WFL for tasks assessed/performed   Area of Impairment: Problem solving          Following Commands: Follows one step commands with slightly increased time     Problem Solving: Requires tactile cues, Requires verbal cues, Difficulty sequencing                  Home Living Family/patient expects to be discharged to:: Private residence     Type of Home: House   Entrance [place] of Steps: 1   Home Layout: One level     Bathroom Shower/Tub: Tub/shower unit         Home Equipment: Agricultural consultant (2 wheels);Shower seat   Additional Comments: The pt was a questionable historian at times, therefore information reported may need to ber verified.      Prior Functioning/Environment Prior Level of Function : Independent/Modified Independent             Mobility Comments: Ambulated without an assistive device. ADLs Comments: She reported being independent with ADLs and driving.        OT Problem List: Decreased strength;Decreased range of motion;Decreased activity tolerance;Impaired balance (sitting and/or standing);Decreased cognition;Decreased knowledge of use of DME or AE;Decreased knowledge of precautions;Pain      OT Treatment/Interventions: Self-care/ADL training;Therapeutic exercise;Energy conservation;DME and/or AE instruction;Therapeutic activities;Balance training;Patient/family education    OT Goals(Current goals can be found in  the care plan section) Acute Rehab OT Goals Patient Stated Goal: to get better and return home OT Goal Formulation: With patient Time For Goal Achievement: 06/17/23 Potential to Achieve Goals: Good ADL Goals Pt Will Perform Upper Body Dressing: with supervision;sitting Pt Will Perform Lower Body Dressing: with supervision;sit to/from stand Pt Will Transfer to Toilet: with supervision;ambulating Pt Will Perform Toileting - Clothing Manipulation and hygiene: with supervision;sit to/from stand  OT Frequency: Min 1X/week       AM-PAC OT "6 Clicks" Daily Activity     Outcome Measure Help from another person eating meals?: A Little Help from another person taking care of personal grooming?: A Little Help from another person toileting, which includes using toliet, bedpan, or urinal?: A Lot Help from another person bathing (including washing, rinsing, drying)?: A Lot Help from another person to put on and taking off regular upper body clothing?: A Lot Help from another person to put on and taking off regular lower body clothing?: A Lot 6 Click Score: 14   End of Session Equipment Utilized During Treatment: Other (comment) (N/A) Nurse Communication: Mobility status  Activity Tolerance: Patient tolerated treatment well Patient left: in chair;with call bell/phone within reach;with chair alarm set;with family/visitor present  OT Visit Diagnosis: Unsteadiness on feet (R26.81);Pain;Muscle weakness (generalized) (M62.81) Pain - Right/Left: Left Pain - part of body: Arm                Time: 1017-1050 OT Time Calculation (min): 33 min Charges:  OT  General Charges $OT Visit: 1 Visit OT Evaluation $OT Eval Moderate Complexity: 1 Mod OT Treatments $Therapeutic Activity: 8-22 mins   Reuben Likes, OTR/L 06/03/2023, 11:53 AM

## 2023-06-04 ENCOUNTER — Ambulatory Visit: Payer: Medicare Other | Admitting: Physical Therapy

## 2023-06-04 DIAGNOSIS — E876 Hypokalemia: Secondary | ICD-10-CM | POA: Diagnosis not present

## 2023-06-04 DIAGNOSIS — I629 Nontraumatic intracranial hemorrhage, unspecified: Secondary | ICD-10-CM | POA: Diagnosis not present

## 2023-06-04 NOTE — Care Management Obs Status (Signed)
MEDICARE OBSERVATION STATUS NOTIFICATION   Patient Details  Name: Penny [name] MRN: [id number] Date of Birth: 03/31/1933   Medicare Observation Status Notification Given:       Kylie Gros R Emari Hreha, RN 06/04/2023, 3:30 PM 

## 2023-06-04 NOTE — H&P (Signed)
30 Day PASRR Note   Patient Details  Name: Penny [name] Date of Birth: [birth year]   Transition of Care [facility]) CM/SW Contact:    Howell Rucks, RN Phone Number: 06/04/2023, 3:55 PM  To Whom It May Concern:  Please be advised that this patient will require a short-term nursing home stay - anticipated 30 days or less for rehabilitation and strengthening.   The plan is for return home.

## 2023-06-04 NOTE — TOC Initial Note (Addendum)
Transition of Care [facility]) - Initial/Assessment Note    Patient Details  Name: Penny [name] MRN: [id number] Date of Birth: 12/02/1933  Transition of Care [facility]) CM/SW Contact:    Howell Rucks, RN Phone Number: 06/04/2023, 2:55 PM  Clinical Narrative: NCM call to pt's dtr Dava Najjar) to introduce role of  TOC/NCM and review for dc needs, PT recommendation for short term rehab-SNF. Dava Najjar reports she and her brother [name]) have discussed recommenation for SNF and are agreeable, no preference. NCM will fax out bed offers. NCM reviewed MOON with [name], NCM will sign as verbal given and leave MOON form in pt's room for family to review. Sandee/Jerry no for questions. TOC will continue to follow.        -4:38pm PASRR initiated, additional information uploaded, await PASSR number.              Expected Discharge Plan: Skilled Nursing Facility Barriers to Discharge: Continued Medical Work up   Patient Goals and CMS Choice Patient states their goals for this hospitalization and ongoing recovery are:: short term rehab-SNF CMS Medicare.gov Compare Post Acute Care list provided to:: Patient Represenative (must comment) (Sandee (dtr) and [name] (son)) Choice offered to / list presented to : Adult Children Arts development officer (dtr) and [name] (son))      Expected Discharge Plan and Services   Discharge Planning Services: CM Consult   Living arrangements for the past 2 months: Single Family Home                                      Prior Living Arrangements/Services Living arrangements for the past 2 months: Single Family Home Lives with:: Adult Children Patient language and need for interpreter reviewed:: Yes Do you feel safe going back to the place where you live?: No   DC plan for short term rehab  Need for Family Participation in Patient Care: Yes (Comment) Care giver support system in place?: Yes (comment)   Criminal Activity/Legal Involvement Pertinent to Current  Situation/Hospitalization: No - Comment as needed  Activities of Daily Living Home Assistive Devices/Equipment: Walker (specify type) ADL Screening (condition at time of admission) Patient's cognitive ability adequate to safely complete daily activities?: Yes Is the patient deaf or have difficulty hearing?: Yes Does the patient have difficulty seeing, even when wearing glasses/contacts?: No Does the patient have difficulty concentrating, remembering, or making decisions?: Yes Patient able to express need for assistance with ADLs?: Yes Does the patient have difficulty dressing or bathing?: Yes Independently performs ADLs?: Yes (appropriate for developmental age) Does the patient have difficulty walking or climbing stairs?: Yes Weakness of Legs: Both Weakness of Arms/Hands: Both  Permission Sought/Granted Permission sought to share information with : Case Manager Permission granted to share information with : Yes, Verbal Permission Granted  Share Information with NAME: Fannie Knee, RN           Emotional Assessment       Orientation: : Oriented to Self, Oriented to Place, Oriented to  Time Alcohol / Substance Use: Not Applicable Psych Involvement: No (comment)  Admission diagnosis:  Intracranial hemorrhage (HCC) [I62.9] Fall, initial encounter [W19.XXXA] Closed fracture of shaft of left humerus, unspecified fracture morphology, initial encounter [S42.302A] Intraparenchymal hematoma of brain, right, with unknown loss of consciousness status, initial encounter [facility]) [S06.31AA] Patient Active Problem List   Diagnosis Date Noted   Intracranial hemorrhage (HCC) 06/01/2023   Closed fracture of left proximal  humerus 06/01/2023   Fall at home, initial encounter 06/01/2023   History of COPD 06/01/2023   Allergic rhinitis 06/01/2023   Depression 06/01/2023   Intraparenchymal hematoma of brain (HCC) 06/01/2023   Closed fracture of shaft of left humerus 06/01/2023   Rigidity  (muscles) 05/21/2023   Traumatic head injury less than 3 months ago 05/21/2023   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   Malignant neoplasm of upper lobe of lung, unspecified laterality (HCC) 01/07/2023   Syncope    Symptomatic sinus bradycardia 09/10/2022   Hypokalemia 09/10/2022   Hypomagnesemia 09/10/2022   CAD (coronary artery disease) 09/10/2022   COVID-19 (11/30/2021) 12/01/2021   B12 deficiency 06/28/2020   Abnormal glucose 10/09/2018   Osteoporosis 12/28/2015   COPD (chronic obstructive pulmonary disease) with emphysema (HCC) 02/25/2015   Atherosclerosis of aorta (HCC) BY ct SCAN IN 2015 02/25/2015   Essential hypertension    Hyperlipidemia, mixed    Vitamin D deficiency    Personal history of lung cancer 12/04/1996   PCP:  Lucky Cowboy, MD Pharmacy:   [facility] [id number] [place], [place] - [address] [place] [address] Phone: [phone] Fax: [phone]     Social Determinants of Health (SDOH) Social History: SDOH Screenings   Food Insecurity: No Food Insecurity (06/01/2023)  Housing: Low Risk  (06/01/2023)  Transportation Needs: No Transportation Needs (06/01/2023)  Utilities: Not At Risk (06/01/2023)  Depression (PHQ2-9): Medium Risk (05/14/2023)  Tobacco Use: Medium Risk (06/01/2023)   SDOH Interventions:     Readmission Risk Interventions     No data to display

## 2023-06-04 NOTE — NC FL2 (Signed)
[place] MEDICAID FL2 LEVEL OF CARE FORM     IDENTIFICATION  Patient Name: Penny [name] Birthdate: 1933-08-26 Sex: female Admission Date (Current Location): 06/01/2023  [facility] and [place] Number:  Producer, television/film/video and Address:  [facility],  [address], [place] [address]      Provider Number: [id number]  Attending Physician Name and Address:  David Stall, Darin Engels, MD  Relative Name and Phone Number:  Aubrielle Maddox (son) [phone]    Current Level of Care: Hospital Recommended Level of Care: Skilled Nursing Facility Prior Approval Number:    Date Approved/Denied:   PASRR Number: pending  Discharge Plan: SNF    Current Diagnoses: Patient Active Problem List   Diagnosis Date Noted   Intracranial hemorrhage (HCC) 06/01/2023   Closed fracture of left proximal humerus 06/01/2023   Fall at home, initial encounter 06/01/2023   History of COPD 06/01/2023   Allergic rhinitis 06/01/2023   Depression 06/01/2023   Intraparenchymal hematoma of brain (HCC) 06/01/2023   Closed fracture of shaft of left humerus 06/01/2023   Rigidity (muscles) 05/21/2023   Traumatic head injury less than 3 months ago 05/21/2023   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   Malignant neoplasm of upper lobe of lung, unspecified laterality (HCC) 01/07/2023   Syncope    Symptomatic sinus bradycardia 09/10/2022   Hypokalemia 09/10/2022   Hypomagnesemia 09/10/2022   CAD (coronary artery disease) 09/10/2022   COVID-19 (11/30/2021) 12/01/2021   B12 deficiency 06/28/2020   Abnormal glucose 10/09/2018   Osteoporosis 12/28/2015   COPD (chronic obstructive pulmonary disease) with emphysema (HCC) 02/25/2015   Atherosclerosis of aorta (HCC) BY ct SCAN IN 2015 02/25/2015   Essential hypertension    Hyperlipidemia, mixed    Vitamin D deficiency    Personal history of lung cancer 12/04/1996    Orientation RESPIRATION BLADDER Height & Weight     Time, Situation, Self  Normal  Incontinent, External catheter Weight: 60.1 kg Height:  5\' 2"  (157.5 cm)  BEHAVIORAL SYMPTOMS/MOOD NEUROLOGICAL BOWEL NUTRITION STATUS      Continent Diet (regular)  AMBULATORY STATUS COMMUNICATION OF NEEDS Skin   Extensive Assist Verbally Skin abrasions (Abrasions arm, face, hip, knees)                       Personal Care Assistance Level of Assistance  Bathing, Feeding, Dressing Bathing Assistance: Limited assistance Feeding assistance: Limited assistance Dressing Assistance: Limited assistance     Functional Limitations Info  Sight, Hearing, Speech Sight Info: Impaired (eyeglasses) Hearing Info: Impaired Speech Info: Adequate    SPECIAL CARE FACTORS FREQUENCY  PT (By licensed PT), OT (By licensed OT)     PT Frequency: 5x/wk OT Frequency: 5x/wk            Contractures Contractures Info: Not present    Additional Factors Info  Code Status, Allergies, Psychotropic Code Status Info: Full Code Allergies Info: Ace Inhibitors, Calcium-containing Compounds, Lactose Intolerance (Gi), Polysporin (Bacitracin-polymyxin B) Psychotropic Info: Wellbutrin 75mg  po daily         Current Medications (06/04/2023):  This is the current hospital active medication list Current Facility-Administered Medications  Medication Dose Route Frequency Provider Last Rate Last Admin   acetaminophen (TYLENOL) tablet 650 mg  650 mg Oral Q6H PRN Howerter, Justin B, DO   650 mg at 06/03/23 1152   Or   acetaminophen (TYLENOL) suppository 650 mg  650 mg Rectal Q6H PRN Howerter, Justin B, DO       albuterol (PROVENTIL) (  2.5 MG/3ML) 0.083% nebulizer solution 2.5 mg  2.5 mg Nebulization Q4H PRN Howerter, Justin B, DO       buPROPion [facility]) tablet 75 mg  75 mg Oral Daily Howerter, Justin B, DO   75 mg at 06/04/23 0958   HYDROcodone-acetaminophen (NORCO/VICODIN) 5-325 MG per tablet 1 tablet  1 tablet Oral Q6H PRN Howerter, Justin B, DO   1 tablet at 06/04/23 0206   labetalol (NORMODYNE) injection  10 mg  10 mg Intravenous Q2H PRN Howerter, Justin B, DO       mometasone-formoterol (DULERA) 100-5 MCG/ACT inhaler 2 puff  2 puff Inhalation BID Howerter, Justin B, DO   2 puff at 06/04/23 0748   montelukast (SINGULAIR) tablet 10 mg  10 mg Oral QHS Howerter, Justin B, DO   10 mg at 06/03/23 2201   polyethylene glycol (MIRALAX / GLYCOLAX) packet 17 g  17 g Oral BID Marinda Elk, MD   17 g at 06/03/23 2201   umeclidinium bromide (INCRUSE ELLIPTA) 62.5 MCG/ACT 1 puff  1 puff Inhalation Daily Howerter, Justin B, DO   1 puff at 06/04/23 0749     Discharge Medications: Please see discharge summary for a list of discharge medications.  Relevant Imaging Results:  Relevant Lab Results:   Additional Information SSN: [id number]  Howell Rucks, RN

## 2023-06-04 NOTE — Progress Notes (Signed)
Physical Therapy Treatment Patient Details Name: Penny [name] MRN: [id number] DOB: [birth year] Today's Date: 06/04/2023   History of Present Illness 89 yo female admitted with ICH, L humerus fx after falling at home. Hx of falls, COPD, depression, COVID, osteoporosis, lung ca    PT Comments  Progressing slowly with mobility. Pt continues to exhibits some cognitive deficits. She participated fairly well. Remains at risk for falls when mobilizing. Reports some pain in L UE with activity. Continue to recommend post acute rehab.      Assistance Recommended at Discharge Frequent or constant Supervision/Assistance  If plan is discharge home, recommend the following:  Can travel by private vehicle    A lot of help with walking and/or transfers;A lot of help with bathing/dressing/bathroom;Assistance with cooking/housework;Assist for transportation;Help with stairs or ramp for entrance   No  Equipment Recommendations   (TBD next venue)    Recommendations for Other Services OT consult     Precautions / Restrictions Precautions Precautions: Fall Precaution Comments: high fall risk Required Braces or Orthoses: Splint/Cast Splint/Cast: L UE Restrictions Weight Bearing Restrictions: Yes LUE Weight Bearing: Non weight bearing Other Position/Activity Restrictions: LUE sling     Mobility  Bed Mobility Overal bed mobility: Needs Assistance Bed Mobility: Supine to Sit, Sit to Supine     Supine to sit: Min assist, HOB elevated Sit to supine: Mod assist   General bed mobility comments: Assist for bil LEs and for trunk. Pt used bedrail. Cues required. Increased time.    Transfers Overall transfer level: Needs assistance Equipment used: Rolling walker (2 wheels) Transfers: Sit to/from Stand Sit to Stand: Min assist           General transfer comment: Assist to power up, stabilize. Cues for safety, technique, hand/feet placement.    Ambulation/Gait Ambulation/Gait  assistance: Min assist Gait Distance (Feet): 10 Feet (10'x2; 5'x2) Assistive device: Rolling walker (2 wheels) Gait Pattern/deviations: Step-through pattern, Decreased stride length       General Gait Details: Used RW this session: had pt hold on to R handle while therapist assisted pt with manevuring RW on opposite side. Cues for safety, increased step length, RW proximity, and for pt to hold onto walker with 1 hands (attempting to use L UE even though it's in a sling-repeated cueing required). Assist to stabilze throughout distance. Fall risk.   Stairs             Wheelchair Mobility     Tilt Bed    Modified Rankin (Stroke Patients Only)       Balance Overall balance assessment: Needs assistance, History of Falls         Standing balance support: Single extremity supported, During functional activity Standing balance-Leahy Scale: Poor                              Cognition Arousal/Alertness: Awake/alert Behavior During Therapy: WFL for tasks assessed/performed Overall Cognitive Status: No family/caregiver present to determine baseline cognitive functioning Area of Impairment: Problem solving                       Following Commands: Follows one step commands with increased time     Problem Solving: Requires tactile cues, Requires verbal cues, Difficulty sequencing General Comments: can be easily distracted at times        Exercises      General Comments        Pertinent Vitals/Pain  Pain Assessment Pain Assessment: Faces Faces Pain Scale: Hurts little more Pain Location: LUE only with activity Pain Descriptors / Indicators: Discomfort, Aching Pain Intervention(s): Limited activity within patient's tolerance, Monitored during session, Repositioned    Home Living                          Prior Function            PT Goals (current goals can now be found in the care plan section) Progress towards PT goals:  Progressing toward goals    Frequency    Min 1X/week      PT Plan Current plan remains appropriate    Co-evaluation              AM-PAC PT "6 Clicks" Mobility   Outcome Measure  Help needed turning from your back to your side while in a flat bed without using bedrails?: A Lot Help needed moving from lying on your back to sitting on the side of a flat bed without using bedrails?: A Lot Help needed moving to and from a bed to a chair (including a wheelchair)?: A Lot Help needed standing up from a chair using your arms (e.g., wheelchair or bedside chair)?: A Little Help needed to walk in hospital room?: A Lot Help needed climbing 3-5 steps with a railing? : Total 6 Click Score: 12    End of Session Equipment Utilized During Treatment: Gait belt Activity Tolerance: Patient tolerated treatment well Patient left: in bed;with call bell/phone within reach;with bed alarm set   PT Visit Diagnosis: Pain;History of falling (Z91.81);Repeated falls (R29.6);Difficulty in walking, not elsewhere classified (R26.2) Pain - Right/Left: Left Pain - part of body: Arm     Time: 0347-4259 PT Time Calculation (min) (ACUTE ONLY): 37 min  Charges:    $Gait Training: 8-22 mins $Therapeutic Activity: 8-22 mins PT General Charges $$ ACUTE PT VISIT: 1 Visit                         Faye Ramsay, PT Acute Rehabilitation  Office: [phone]

## 2023-06-04 NOTE — Care Management Obs Status (Signed)
MEDICARE OBSERVATION STATUS NOTIFICATION   Patient Details  Name: Penny [name] MRN: [id number] Date of Birth: 06-28-1933   Medicare Observation Status Notification Given:       Howell Rucks, RN 06/04/2023, 3:30 PM

## 2023-06-04 NOTE — Progress Notes (Signed)
[facility] PROGRESS NOTE    Progress Note  Alazne Pentland Eisenhart  XBJ:[id number] DOB: 07/15/1933 DOA: 06/01/2023 PCP: Lucky Cowboy, MD     Brief Narrative:   Ruther Single is an 89 y.o. female past medical history significant for COPD, allergic rhinitis depression admitted to hospital hospital for traumatic intracranial hemorrhage CT of the head showed punctuated foci of acute intraparenchymal hemorrhage within the right frontal cortex without any midline shift cervical C-spine showed no evidence of acute fracture or subluxation.  The case was discussed with neurosurgery Dr. Conchita Paris who recommended repeated CT. repeated CT scan shows stable hemorrhage no midline shift.  Awaiting SNF placement.   Assessment/Plan:   Traumatic intracranial hemorrhage : Neurosurgery was curb sided recommended to repeat a CT of the head that was unchanged Continue IV labetalol for blood pressure greater than 160 continue to hold antiplatelet therapy.  Acute left proximal humeral fracture: Imaging show acute left humeral fracture shaft, case discussed with orthopedic surgery [facility] who recommended splint for immobilization. An outpatient follow-up with Dr. Aundria Rud continue analgesics for pain control. Continue analgesics for pain control, started on MiraLAX p.o. twice daily  Mechanical fall: OT evaluated the patient, will need skilled nursing facility placement. Consult TOC.  Hypokalemia: Repleted and improved.  COPD exacerbation: Continue inhalers no changes made.  Allergic rhinitis: Continue Zyrtec.   DVT prophylaxis: SCD Family Communication:none Status is: Observation The patient remains OBS appropriate and will d/c before 2 midnights.    Code Status:     Code Status Orders  (From admission, onward)           Start     Ordered   06/01/23 0506  Full code  Continuous       Question:  By:  Answer:  Consent: discussion documented in EHR   06/01/23 0505            Code Status History     Date Active Date Inactive Code Status Order ID Comments User Context   09/10/2022 2142 09/13/2022 1804 Full Code [id number]  Vonna Drafts, MD ED   10/20/2014 2036 10/22/2014 2113 Full Code [id number]  Abigail Miyamoto, MD Inpatient         IV Access:   Peripheral IV   Procedures and diagnostic studies:   No results found.   Medical Consultants:   None.   Subjective:    Charlyne Quale Fodera relates her arm is hurting, when she gets the pain medication controls her pain. Objective:    Vitals:   06/03/23 2059 06/04/23 0500 06/04/23 0511 06/04/23 0748  BP: (!) 136/119  135/83   Pulse: 95     Resp: 18  18   Temp: 97.8 F (36.6 C)  98.1 F (36.7 C)   TempSrc:      SpO2: 96%  94% 94%  Weight:  60.1 kg    Height:       SpO2: 94 %   Intake/Output Summary (Last 24 hours) at 06/04/2023 0828 Last data filed at 06/04/2023 0138 Gross per 24 hour  Intake 360 ml  Output 1000 ml  Net -640 ml    Filed Weights   06/01/23 1838 06/03/23 0451 06/04/23 0500  Weight: 64.7 kg 60.8 kg 60.1 kg    Exam: General exam: In no acute distress. Respiratory system: Good air movement and clear to auscultation. Cardiovascular system: S1 & S2 heard, RRR. No JVD. Gastrointestinal system: Abdomen is nondistended, soft and nontender.  Extremities: No pedal edema. Skin: No rashes, lesions or  ulcers Psychiatry: Judgement and insight appear normal. Mood & affect appropriate. Data Reviewed:    Labs: Basic Metabolic Panel: Recent Labs  Lab 06/01/23 0239 06/01/23 0938  NA 137 139  K 2.9* 3.7  CL 104 106  CO2 24 25  GLUCOSE 172* 130*  BUN 13 11  CREATININE 0.71 0.62  CALCIUM 9.1 8.7*  MG 1.5*  --     GFR Estimated Creatinine Clearance: 39.2 mL/min (by C-G formula based on SCr of 0.62 mg/dL). Liver Function Tests: Recent Labs  Lab 06/01/23 0938  AST 24  ALT 15  ALKPHOS 60  BILITOT 0.8  PROT 6.4*  ALBUMIN 3.6    No results for  input(s): "LIPASE", "AMYLASE" in the last 168 hours. No results for input(s): "AMMONIA" in the last 168 hours. Coagulation profile Recent Labs  Lab 06/01/23 0239  INR 1.0    COVID-19 Labs  No results for input(s): "DDIMER", "FERRITIN", "LDH", "CRP" in the last 72 hours.  No results found for: "SARSCOV2NAA"  CBC: Recent Labs  Lab 06/01/23 0239 06/01/23 0938  WBC 10.0 6.7  NEUTROABS 8.5* 4.8  HGB 11.9* 11.6*  HCT 38.1 36.8  MCV 87.4 87.2  PLT 183 194    Cardiac Enzymes: Recent Labs  Lab 06/01/23 0239  CKTOTAL 224    BNP (last 3 results) No results for input(s): "PROBNP" in the last 8760 hours. CBG: No results for input(s): "GLUCAP" in the last 168 hours. D-Dimer: No results for input(s): "DDIMER" in the last 72 hours. Hgb A1c: No results for input(s): "HGBA1C" in the last 72 hours. Lipid Profile: No results for input(s): "CHOL", "HDL", "LDLCALC", "TRIG", "CHOLHDL", "LDLDIRECT" in the last 72 hours. Thyroid function studies: No results for input(s): "TSH", "T4TOTAL", "T3FREE", "THYROIDAB" in the last 72 hours.  Invalid input(s): "FREET3" Anemia work up: No results for input(s): "VITAMINB12", "FOLATE", "FERRITIN", "TIBC", "IRON", "RETICCTPCT" in the last 72 hours. Sepsis Labs: Recent Labs  Lab 06/01/23 0239 06/01/23 0938  WBC 10.0 6.7    Microbiology No results found for this or any previous visit (from the past 240 hour(s)).   Medications:    buPROPion  75 mg Oral Daily   mometasone-formoterol  2 puff Inhalation BID   montelukast  10 mg Oral QHS   polyethylene glycol  17 g Oral BID   umeclidinium bromide  1 puff Inhalation Daily   Continuous Infusions:    LOS: 0 days   Marinda Elk  [facility]  06/04/2023, 8:28 AM

## 2023-06-05 DIAGNOSIS — W19XXXA Unspecified fall, initial encounter: Secondary | ICD-10-CM | POA: Diagnosis not present

## 2023-06-05 DIAGNOSIS — I629 Nontraumatic intracranial hemorrhage, unspecified: Secondary | ICD-10-CM | POA: Diagnosis not present

## 2023-06-05 DIAGNOSIS — S42302A Unspecified fracture of shaft of humerus, left arm, initial encounter for closed fracture: Secondary | ICD-10-CM | POA: Diagnosis not present

## 2023-06-05 DIAGNOSIS — S0631AA Contusion and laceration of right cerebrum with loss of consciousness status unknown, initial encounter: Secondary | ICD-10-CM | POA: Diagnosis not present

## 2023-06-05 MED ORDER — HYDROCODONE-ACETAMINOPHEN 5-325 MG PO TABS: 1.0000 | ORAL_TABLET | Freq: Four times a day (QID) | ORAL | 0 refills | Status: AC | PRN

## 2023-06-05 NOTE — Progress Notes (Signed)
Agree with previous RN shift assessment. 

## 2023-06-05 NOTE — Discharge Summary (Signed)
Physician Discharge Summary  Penny [name] ZOX:[id number] DOB: [birth year] DOA: 06/01/2023  PCP: Lucky Cowboy, MD  Admit date: 06/01/2023 Discharge date: 06/05/2023  Admitted From: Home Disposition:  SNF  Recommendations for Outpatient Follow-up:  Follow up with orthopedic surgery in 1 to 2 weeks for left humeral fracture  Home Health:No Equipment/Devices:None  Discharge Condition:Stable CODE STATUS:Full Diet recommendation: Heart Healthy   Brief/Interim Summary:  89 y.o. female past medical history significant for COPD, allergic rhinitis depression admitted to hospital hospital for traumatic intracranial hemorrhage CT of the head showed punctuated foci of acute intraparenchymal hemorrhage within the right frontal cortex without any midline shift cervical C-spine showed no evidence of acute fracture or subluxation.  The case was discussed with neurosurgery Dr. Conchita Paris who recommended repeated CT. repeated CT scan shows stable hemorrhage no midline shift.   Discharge Diagnoses:  Principal Problem:   Intracranial hemorrhage (HCC) Active Problems:   Hypokalemia   Closed fracture of left proximal humerus   Fall at home, initial encounter   History of COPD   Allergic rhinitis   Depression   Intraparenchymal hematoma of brain (HCC)   Closed fracture of shaft of left humerus  Traumatic intracranial hemorrhage: Neurosurgery was curb sided and they just recommended to repeat the CT scan of the head which is unchanged from previous and midline shift. Her blood pressure were controlled and hydralazine was used when blood pressure was greater than 160 her antiplatelet therapy were held she will resume these as an outpatient.  Acute left humeral fracture: Imaging showed new acute humeral shaft fracture case discussed with orthopedic surgery [facility] who recommended splint and immobilization and follow-up with them as an outpatient. She was started on narcotics for pain and  MiraLAX for bowel regimen.  Accidental mechanical fall: PT OT evaluated the patient, she will need skilled nursing facility.  Hypokalemia: Replete orally now improved.  COPD: Noted continue current medications no changes made.  Allergic rhinitis:  Discharge Instructions  Discharge Instructions     Diet - low sodium heart healthy   Complete by: As directed    Increase activity slowly   Complete by: As directed       Allergies as of 06/05/2023       Reactions   Ace Inhibitors Cough   Calcium-containing Compounds Other (See Comments)   constpation   Lactose Intolerance (gi) Diarrhea   Polysporin [bacitracin-polymyxin B] Rash   Polysporin or neosporin gave her a rash, couldn't remember which        Medication List     TAKE these medications    acetaminophen 325 MG tablet Commonly known as: TYLENOL Take 650 mg by mouth as needed for mild pain or moderate pain.   aspirin 81 MG chewable tablet Chew 81 mg by mouth every evening.   Breztri Aerosphere 160-9-4.8 MCG/ACT Aero Generic drug: Budeson-Glycopyrrol-Formoterol Inhale 1 puff into the lungs in the morning and at bedtime.   buPROPion 75 MG tablet Commonly known as: WELLBUTRIN One tablet once a day   cetirizine 10 MG tablet Commonly known as: ZYRTEC Takes 1 tablet Daily What changed:  how much to take how to take this when to take this additional instructions   Cholecalciferol 125 MCG (5000 UT) capsule Take 1 capsules Daily   diphenhydramine-acetaminophen 25-500 MG Tabs tablet Commonly known as: TYLENOL PM Take 1 tablet by mouth at bedtime as needed (prn).   HYDROcodone-acetaminophen 5-325 MG tablet Commonly known as: NORCO/VICODIN Take 1 tablet by mouth every 6 (six) hours as  needed for up to 5 days for moderate pain.   montelukast 10 MG tablet Commonly known as: SINGULAIR Take  1 tablet  Daily for Allergies                                                            /                             TAKE                      BY                     MOUTH   Vitamin B-12 1000 MCG Subl Takes 1 tab SL Daily        Allergies  Allergen Reactions   Ace Inhibitors Cough   Calcium-Containing Compounds Other (See Comments)    constpation   Lactose Intolerance (Gi) Diarrhea   Polysporin [Bacitracin-Polymyxin B] Rash    Polysporin or neosporin gave her a rash, couldn't remember which    Consultations: None   Procedures/Studies: CT HEAD WO CONTRAST ( )  Result Date: 06/01/2023 CLINICAL DATA:  Follow-up hemorrhage. EXAM: CT HEAD WITHOUT CONTRAST TECHNIQUE: Contiguous axial images were obtained from the base of the skull through the vertex without intravenous contrast. RADIATION DOSE REDUCTION: This exam was performed according to the departmental dose-optimization program which includes automated exposure control, adjustment of the mA and/or kV according to patient size and/or use of iterative reconstruction technique. COMPARISON:  06/01/2023 at 3:16 a.m. and 11/13/2022. FINDINGS: Brain: Examination demonstrates continued evidence of 3 mm hypodense focus over the right frontal region which may be parenchymal versus subarachnoid in location. This is unchanged in size, but slightly less dense compared to the recent prior study. No additional sites of hemorrhage. Remainder of the exam is unchanged to include moderate chronic ischemic microvascular disease. Vascular: No hyperdense vessel or unexpected calcification. Skull: Normal. Negative for fracture or focal lesion. Sinuses/Orbits: No acute finding. Other: None. IMPRESSION: 1. Continued evidence of 3 mm hypodense focus over the right frontal region which may be parenchymal versus subarachnoid in location. This is unchanged in size, but slightly less dense compared to the recent prior study. No additional sites of hemorrhage. 2. Moderate chronic ischemic microvascular disease. Electronically Signed   By: Elberta Fortis M.D.   On: 06/01/2023 11:48    CT Head Wo Contrast  Result Date: 06/01/2023 CLINICAL DATA:  Head trauma, moderate-severe; Neck trauma (Age >= 65y), Unwitnessed fall EXAM: CT HEAD WITHOUT CONTRAST CT CERVICAL SPINE WITHOUT CONTRAST TECHNIQUE: Multidetector CT imaging of the head and cervical spine was performed following the standard protocol without intravenous contrast. Multiplanar CT image reconstructions of the cervical spine were also generated. RADIATION DOSE REDUCTION: This exam was performed according to the departmental dose-optimization program which includes automated exposure control, adjustment of the mA and/or kV according to patient size and/or use of iterative reconstruction technique. COMPARISON:  11/13/2022 FINDINGS: CT HEAD FINDINGS Brain: A punctate focus of acute intraparenchymal hemorrhage is seen within the right frontal cortex measuring 3 mm in diameter at axial image # 27/3. No associated abnormal mass effect. No midline shift. Mild parenchymal volume loss is commensurate with the patient's age. Extensive periventricular  and subcortical white matter changes are again identified, stable since prior examination, possibly reflecting sequela of Mccreery vessel ischemia though demyelinating disease or prior radiation therapy could appear similarly. No acute infarct. No abnormal intra or extra-axial mass lesion. Ventricular size is normal. Cerebellum is unremarkable. Vascular: No hyperdense vessel or unexpected calcification. Skull: Normal. Negative for fracture or focal lesion. Sinuses/Orbits: No acute finding. Other: Mastoid air cells and middle ear cavities are clear. CT CERVICAL SPINE FINDINGS Alignment: There is straightening of the cervical spine. Stable 2 mm anterolisthesis C3-4 and 2 mm retrolisthesis C6-7. No acute traumatic listhesis identified. Skull base and vertebrae: Craniocervical alignment is normal. The atlantodental interval is not widened. No acute fracture of the cervical spine. Vertebral body height is  preserved. Soft tissues and spinal canal: No prevertebral fluid or swelling. No visible canal hematoma. Disc levels: There is intervertebral disc space narrowing, endplate remodeling, and disc calcification throughout the cervical spine in keeping with changes of diffuse advanced degenerative disc disease. Multilevel uncovertebral and facet arthrosis results in multilevel moderate to severe neuroforaminal narrowing, most severe on the left at C4-5. Prevertebral soft tissues are not thickened on sagittal reformats. No high-grade canal stenosis. Upper chest: Negative. Other: None IMPRESSION: 1. Punctate focus of acute intraparenchymal hemorrhage within the right frontal cortex. No associated abnormal mass effect. No midline shift. 2. No acute fracture or listhesis of the cervical spine. 3. Advanced degenerative disc and degenerative joint disease resulting in multilevel moderate to severe neuroforaminal narrowing, most severe on the left at C4-5. These results were called by telephone at the time of interpretation on 06/01/2023 at 3:50 am to provider [facility] , who verbally acknowledged these results. Electronically Signed   By: Helyn Numbers M.D.   On: 06/01/2023 03:50   CT Cervical Spine Wo Contrast  Result Date: 06/01/2023 CLINICAL DATA:  Head trauma, moderate-severe; Neck trauma (Age >= 65y), Unwitnessed fall EXAM: CT HEAD WITHOUT CONTRAST CT CERVICAL SPINE WITHOUT CONTRAST TECHNIQUE: Multidetector CT imaging of the head and cervical spine was performed following the standard protocol without intravenous contrast. Multiplanar CT image reconstructions of the cervical spine were also generated. RADIATION DOSE REDUCTION: This exam was performed according to the departmental dose-optimization program which includes automated exposure control, adjustment of the mA and/or kV according to patient size and/or use of iterative reconstruction technique. COMPARISON:  11/13/2022 FINDINGS: CT HEAD FINDINGS Brain: A  punctate focus of acute intraparenchymal hemorrhage is seen within the right frontal cortex measuring 3 mm in diameter at axial image # 27/3. No associated abnormal mass effect. No midline shift. Mild parenchymal volume loss is commensurate with the patient's age. Extensive periventricular and subcortical white matter changes are again identified, stable since prior examination, possibly reflecting sequela of Mccreery vessel ischemia though demyelinating disease or prior radiation therapy could appear similarly. No acute infarct. No abnormal intra or extra-axial mass lesion. Ventricular size is normal. Cerebellum is unremarkable. Vascular: No hyperdense vessel or unexpected calcification. Skull: Normal. Negative for fracture or focal lesion. Sinuses/Orbits: No acute finding. Other: Mastoid air cells and middle ear cavities are clear. CT CERVICAL SPINE FINDINGS Alignment: There is straightening of the cervical spine. Stable 2 mm anterolisthesis C3-4 and 2 mm retrolisthesis C6-7. No acute traumatic listhesis identified. Skull base and vertebrae: Craniocervical alignment is normal. The atlantodental interval is not widened. No acute fracture of the cervical spine. Vertebral body height is preserved. Soft tissues and spinal canal: No prevertebral fluid or swelling. No visible canal hematoma. Disc levels: There is intervertebral  disc space narrowing, endplate remodeling, and disc calcification throughout the cervical spine in keeping with changes of diffuse advanced degenerative disc disease. Multilevel uncovertebral and facet arthrosis results in multilevel moderate to severe neuroforaminal narrowing, most severe on the left at C4-5. Prevertebral soft tissues are not thickened on sagittal reformats. No high-grade canal stenosis. Upper chest: Negative. Other: None IMPRESSION: 1. Punctate focus of acute intraparenchymal hemorrhage within the right frontal cortex. No associated abnormal mass effect. No midline shift. 2. No  acute fracture or listhesis of the cervical spine. 3. Advanced degenerative disc and degenerative joint disease resulting in multilevel moderate to severe neuroforaminal narrowing, most severe on the left at C4-5. These results were called by telephone at the time of interpretation on 06/01/2023 at 3:50 am to provider [facility] , who verbally acknowledged these results. Electronically Signed   By: Helyn Numbers M.D.   On: 06/01/2023 03:50   DG Pelvis 1-2 Views  Result Date: 06/01/2023 CLINICAL DATA:  Status post fall. EXAM: PELVIS - 1-2 VIEW COMPARISON:  None Available. FINDINGS: There is no evidence of an acute pelvic fracture or diastasis. No pelvic bone lesions are seen. Moderate severity degenerative changes seen involving both hips, in the form of joint space narrowing and acetabular sclerosis. IMPRESSION: Moderate severity degenerative changes involving both hips. Electronically Signed   By: Aram Candela M.D.   On: 06/01/2023 03:45   DG Knee Complete 4 Views Left  Result Date: 06/01/2023 CLINICAL DATA:  Status post fall. EXAM: LEFT KNEE - COMPLETE 4+ VIEW COMPARISON:  None Available. FINDINGS: No evidence of fracture, dislocation, or joint effusion. Marked severity medial and lateral chondrocalcinosis is seen. Soft tissues are unremarkable. IMPRESSION: Marked severity chondrocalcinosis. Electronically Signed   By: Aram Candela M.D.   On: 06/01/2023 03:44   DG Wrist Complete Left  Result Date: 06/01/2023 CLINICAL DATA:  Status post fall. EXAM: LEFT WRIST - COMPLETE 3+ VIEW COMPARISON:  None Available. FINDINGS: There is no evidence of an acute fracture or dislocation. Marked severity degenerative changes are seen along the distal aspect of the left wrist, most prominent along the first carpometacarpal joint. Soft tissues are unremarkable. IMPRESSION: Marked severity degenerative changes without an acute fracture or dislocation. Electronically Signed   By: Aram Candela M.D.   On:  06/01/2023 03:43   DG Elbow Complete Left  Result Date: 06/01/2023 CLINICAL DATA:  Status post fall. EXAM: LEFT ELBOW - COMPLETE 3+ VIEW COMPARISON:  None Available. FINDINGS: There is an acute fracture deformity involving the visualized portion of the proximal left humeral shaft. There is no evidence of dislocation. There is no evidence of arthropathy or other focal bone abnormality. Soft tissues are unremarkable. IMPRESSION: Acute fracture of the proximal left humeral shaft. Electronically Signed   By: Aram Candela M.D.   On: 06/01/2023 03:42   DG Shoulder Left  Result Date: 06/01/2023 CLINICAL DATA:  Status post fall. EXAM: LEFT SHOULDER - 2+ VIEW COMPARISON:  None Available. FINDINGS: There is an acute fracture deformity involving the proximal left humeral shaft. There is no evidence of dislocation. Degenerative changes are seen involving the left acromioclavicular joint. Soft tissue swelling is seen surrounding the previously noted fracture site. IMPRESSION: Acute fracture of the proximal left humeral shaft. Electronically Signed   By: Aram Candela M.D.   On: 06/01/2023 03:41   DG Humerus Left  Result Date: 06/01/2023 CLINICAL DATA:  Status post fall. EXAM: LEFT HUMERUS - 2+ VIEW COMPARISON:  None Available. FINDINGS: There is an acute  fracture of the proximal left humeral shaft. Approximately 1-2 shaft width medial displacement of the distal fracture site is seen. There is no evidence of dislocation. Moderate severity soft tissue swelling is seen along the previously noted fracture site. IMPRESSION: Acute fracture of the proximal left humeral shaft. Electronically Signed   By: Aram Candela M.D.   On: 06/01/2023 03:40   DG Chest 2 View  Result Date: 06/01/2023 CLINICAL DATA:  Status post fall. EXAM: CHEST - 2 VIEW COMPARISON:  November 13, 2022 FINDINGS: The lateral views limited in evaluation secondary to positioning of the patient's left upper extremity. The heart size and  mediastinal contours are within normal limits. Both lungs are clear. An acute, mildly displaced fracture deformity is seen involving the proximal left humeral shaft. Multiple chronic right-sided rib fractures are noted. IMPRESSION: 1. No active cardiopulmonary disease. 2. Acute, mildly displaced fracture of the proximal left humeral shaft. Electronically Signed   By: Aram Candela M.D.   On: 06/01/2023 03:39    Subjective: No complaints  Discharge Exam: Vitals:   06/05/23 0411 06/05/23 0909  BP: 132/68   Pulse: 92   Resp: 16   Temp: 98.1 F (36.7 C)   SpO2: 95% 96%   Vitals:   06/04/23 2043 06/05/23 0411 06/05/23 0500 06/05/23 0909  BP: (!) 124/95 132/68    Pulse: (!) 102 92    Resp: 18 16    Temp: 98.2 F (36.8 C) 98.1 F (36.7 C)    TempSrc: Oral Oral    SpO2: 92% 95%  96%  Weight:   62.7 kg   Height:        General: Pt is alert, awake, not in acute distress Cardiovascular: RRR, S1/S2 +, no rubs, no gallops Respiratory: CTA bilaterally, no wheezing, no rhonchi Abdominal: Soft, NT, ND, bowel sounds + Extremities: no edema, no cyanosis    The results of significant diagnostics from this hospitalization (including imaging, microbiology, ancillary and laboratory) are listed below for reference.     Microbiology: No results found for this or any previous visit (from the past 240 hour(s)).   Labs: BNP (last 3 results) No results for input(s): "BNP" in the last 8760 hours. Basic Metabolic Panel: Recent Labs  Lab 06/01/23 0239 06/01/23 0938  NA 137 139  K 2.9* 3.7  CL 104 106  CO2 24 25  GLUCOSE 172* 130*  BUN 13 11  CREATININE 0.71 0.62  CALCIUM 9.1 8.7*  MG 1.5*  --    Liver Function Tests: Recent Labs  Lab 06/01/23 0938  AST 24  ALT 15  ALKPHOS 60  BILITOT 0.8  PROT 6.4*  ALBUMIN 3.6   No results for input(s): "LIPASE", "AMYLASE" in the last 168 hours. No results for input(s): "AMMONIA" in the last 168 hours. CBC: Recent Labs  Lab  06/01/23 0239 06/01/23 0938  WBC 10.0 6.7  NEUTROABS 8.5* 4.8  HGB 11.9* 11.6*  HCT 38.1 36.8  MCV 87.4 87.2  PLT 183 194   Cardiac Enzymes: Recent Labs  Lab 06/01/23 0239  CKTOTAL 224   BNP: Invalid input(s): "POCBNP" CBG: No results for input(s): "GLUCAP" in the last 168 hours. D-Dimer No results for input(s): "DDIMER" in the last 72 hours. Hgb A1c No results for input(s): "HGBA1C" in the last 72 hours. Lipid Profile No results for input(s): "CHOL", "HDL", "LDLCALC", "TRIG", "CHOLHDL", "LDLDIRECT" in the last 72 hours. Thyroid function studies No results for input(s): "TSH", "T4TOTAL", "T3FREE", "THYROIDAB" in the last 72 hours.  Invalid  input(s): "FREET3" Anemia work up No results for input(s): "VITAMINB12", "FOLATE", "FERRITIN", "TIBC", "IRON", "RETICCTPCT" in the last 72 hours. Urinalysis    Component Value Date/Time   COLORURINE STRAW (A) 06/01/2023 0751   APPEARANCEUR CLEAR 06/01/2023 0751   LABSPEC 1.009 06/01/2023 0751   PHURINE 7.0 06/01/2023 0751   GLUCOSEU NEGATIVE 06/01/2023 0751   HGBUR NEGATIVE 06/01/2023 0751   BILIRUBINUR NEGATIVE 06/01/2023 0751   KETONESUR NEGATIVE 06/01/2023 0751   PROTEINUR NEGATIVE 06/01/2023 0751   NITRITE NEGATIVE 06/01/2023 0751   LEUKOCYTESUR NEGATIVE 06/01/2023 0751   Sepsis Labs Recent Labs  Lab 06/01/23 0239 06/01/23 0938  WBC 10.0 6.7   Microbiology No results found for this or any previous visit (from the past 240 hour(s)).   SIGNED:   Marinda Elk, MD  [facility] 06/05/2023, 10:41 AM Pager   If 7PM-7AM, please contact night-coverage [email or web address] Password [email or web address]

## 2023-06-05 NOTE — TOC Progression Note (Addendum)
Transition of Care [facility]) - Progression Note    Patient Details  Name: Penny [name] MRN: [id number] Date of Birth: 1933-01-01  [facility] CM/SW Contact  Howell Rucks, RN Phone Number: 06/05/2023, 11:29 AM  Clinical Narrative:  DC order entered by MD. Teams chat to MD that PT SNF recommendation entered 06/04/23 at 1600, PASRR pending, insurance auth pending, family has not been presented with bed offers.  TOC will continue to follow   -2:53pm NCM call to pt's son, [name], presented with bed offers over the phone, [name] request bed offers to be emailed to his email: [email or web address]. Bed offers emailed as requested, await bed choice. TOC will continue to follow.    Expected Discharge Plan: Skilled Nursing Facility Barriers to Discharge: Continued Medical Work up  Expected Discharge Plan and Services   Discharge Planning Services: CM Consult   Living arrangements for the past 2 months: Single Family Home Expected Discharge Date: 06/05/23                                     Social Determinants of Health (SDOH) Interventions SDOH Screenings   Food Insecurity: No Food Insecurity (06/01/2023)  Housing: Low Risk  (06/01/2023)  Transportation Needs: No Transportation Needs (06/01/2023)  Utilities: Not At Risk (06/01/2023)  Depression (PHQ2-9): Medium Risk (05/14/2023)  Tobacco Use: Medium Risk (06/01/2023)    Readmission Risk Interventions     No data to display

## 2023-06-05 NOTE — Plan of Care (Signed)
  Problem: Education: Goal: Knowledge of General Education information will improve Description: Including pain rating scale, medication(s)/side effects and non-pharmacologic comfort measures Outcome: Progressing   Problem: Clinical Measurements: Goal: Ability to maintain clinical measurements within normal limits will improve Outcome: Progressing Goal: Will remain free from infection Outcome: Progressing Goal: Cardiovascular complication will be avoided Outcome: Progressing   

## 2023-06-06 ENCOUNTER — Ambulatory Visit: Payer: Medicare Other | Admitting: Physical Therapy

## 2023-06-06 DIAGNOSIS — I629 Nontraumatic intracranial hemorrhage, unspecified: Secondary | ICD-10-CM | POA: Diagnosis not present

## 2023-06-06 NOTE — Progress Notes (Signed)
Occupational Therapy Treatment Patient Details Name: Penny [name] MRN: [id number] DOB: 06-24-1933 Today's Date: 06/06/2023   History of present illness 89 yo female admitted with ICH, L humerus fx after falling at home. Hx of falls, COPD, depression, COVID, osteoporosis, lung ca   OT comments  Pt was motivated to participate in the session. She reported having LUE pain with activity. OT readjusted her LUE sling to improved comfort. Pt was also noted to have L hand edema with OT subsequently educating her on hand, wrist, and digit ROM to assist with edema management & to optimize hand function. She was able to stand with min assist, though she presented with noted unsteadiness. She will continue to benefit from OT services to maximize her safety and independence with self-care tasks. Continue OT plan of care.    Recommendations for follow up therapy are one component of a multi-disciplinary discharge planning process, led by the attending physician.  Recommendations may be updated based on patient status, additional functional criteria and insurance authorization.    Assistance Recommended at Discharge Frequent or constant Supervision/Assistance  Patient can return home with the following  A lot of help with bathing/dressing/bathroom;Assistance with cooking/housework;Direct supervision/assist for medications management;Assist for transportation;A little help with walking and/or transfers   Equipment Recommendations  Other (comment) (to be determined pending progress at next setting)    Recommendations for Other Services      Precautions / Restrictions Precautions Precautions: Fall Precaution Comments: high fall risk Required Braces or Orthoses: Splint/Cast;Sling Splint/Cast: L UE Restrictions Weight Bearing Restrictions: Yes LUE Weight Bearing: Non weight bearing Other Position/Activity Restrictions: LUE sling       Mobility Bed Mobility               General bed  mobility comments: pt was received seated in the chair    Transfers Overall transfer level: Needs assistance Equipment used: None Transfers: Sit to/from Stand Sit to Stand: Min assist                     ADL either performed or assessed with clinical judgement   ADL Overall ADL's : Needs assistance/impaired Eating/Feeding: Set up;Sitting Eating/Feeding Details (indicate cue type and reason): at chair level Grooming: Minimal assistance;Sitting Grooming Details (indicate cue type and reason): at chair level         Upper Body Dressing : Moderate assistance;Sitting   Lower Body Dressing: Maximal assistance;Sit to/from stand                                 Cognition Arousal/Alertness: Awake/alert Behavior During Therapy: WFL for tasks assessed/performed   Area of Impairment: Problem solving        Following Commands: Follows one step commands consistently     Problem Solving: Difficulty sequencing                 Pertinent Vitals/ Pain       Pain Assessment Pain Assessment: Faces Pain Score: 5  Pain Location: LUE only with activity Pain Intervention(s): Limited activity within patient's tolerance, Monitored during session, Other (comment) (Sling readjusted)         Frequency  Min 1X/week        Progress Toward Goals  OT Goals(current goals can now be found in the care plan section)  Progress towards OT goals: Progressing toward goals  Acute Rehab OT Goals Patient Stated Goal: to get better and return home  OT Goal Formulation: With patient Time For Goal Achievement: 06/17/23 Potential to Achieve Goals: Good  Plan Discharge plan remains appropriate       AM-PAC OT "6 Clicks" Daily Activity     Outcome Measure   Help from another person eating meals?: A Little Help from another person taking care of personal grooming?: A Little Help from another person toileting, which includes using toliet, bedpan, or urinal?: A Lot Help  from another person bathing (including washing, rinsing, drying)?: A Lot Help from another person to put on and taking off regular upper body clothing?: A Lot Help from another person to put on and taking off regular lower body clothing?: A Lot 6 Click Score: 14    End of Session Equipment Utilized During Treatment: Gait belt  OT Visit Diagnosis: Unsteadiness on feet (R26.81);Pain;Muscle weakness (generalized) (M62.81) Pain - Right/Left: Left Pain - part of body: Arm   Activity Tolerance Patient limited by pain   Patient Left in chair;with call bell/phone within reach;with family/visitor present   Nurse Communication Mobility status        Time: 1455-1510 OT Time Calculation (min): 15 min  Charges: OT General Charges $OT Visit: 1 Visit OT Treatments $Therapeutic Activity: 8-22 mins      Reuben Likes, OTR/L 06/06/2023, 3:41 PM

## 2023-06-06 NOTE — Progress Notes (Signed)
Physical Therapy Treatment Patient Details Name: Jessic Becklund MRN: [id number] DOB: 1933-04-09 Today's Date: 06/06/2023   History of Present Illness 89 yo female admitted with ICH, L humerus fx after falling at home. Hx of falls, COPD, depression, COVID, osteoporosis, lung ca    PT Comments  Pt agreeable to working with therapy. She was able to ambulate ~30 feet with use of a standard RW (pt held onto R handle of walker while therapist pushed L side of walker). She tolerated activity well. She remains at risk for falls 2* poor balance. Son followed with recliner and it was used to transport pt back to bedside. Continue to recommend post acute rehab.      Assistance Recommended at Discharge Frequent or constant Supervision/Assistance  If plan is discharge home, recommend the following:  Can travel by private vehicle    A lot of help with walking and/or transfers;A lot of help with bathing/dressing/bathroom;Assistance with cooking/housework;Assist for transportation;Help with stairs or ramp for entrance   No  Equipment Recommendations       Recommendations for Other Services OT consult     Precautions / Restrictions Precautions Precautions: Fall Precaution Comments: high fall risk Required Braces or Orthoses: Splint/Cast;Sling Splint/Cast: L UE Restrictions Weight Bearing Restrictions: Yes LUE Weight Bearing: Non weight bearing     Mobility  Bed Mobility Overal bed mobility: Needs Assistance Bed Mobility: Supine to Sit     Supine to sit: Min guard, HOB elevated     General bed mobility comments: No assist provided this session. Pt relied on bedrail and elevated HOB. Increased time. Cues required.    Transfers Overall transfer level: Needs assistance Equipment used: Rolling walker (2 wheels) Transfers: Sit to/from Stand Sit to Stand: Min assist           General transfer comment: Assist to power up, stabilize. Cues for safety, technique, hand/feet  placement.    Ambulation/Gait Ambulation/Gait assistance: Min assist Gait Distance (Feet): 30 Feet Assistive device: Rolling walker (2 wheels) Gait Pattern/deviations: Step-through pattern, Decreased stride length       General Gait Details: Used RW this session: had pt hold on to R handle while therapist assisted pt with manevuring RW on opposite side. Cues for safety, increased step length, RW proximity, and for pt to hold onto walker with 1 hand. Assist to stabilze throughout distance. Fall risk.   Stairs             Wheelchair Mobility     Tilt Bed    Modified Rankin (Stroke Patients Only)       Balance Overall balance assessment: Needs assistance, History of Falls         Standing balance support: Single extremity supported, During functional activity Standing balance-Leahy Scale: Poor                              Cognition Arousal/Alertness: Awake/alert Behavior During Therapy: WFL for tasks assessed/performed   Area of Impairment: Problem solving                             Problem Solving: Requires tactile cues, Requires verbal cues, Difficulty sequencing          Exercises      General Comments        Pertinent Vitals/Pain Pain Assessment Pain Assessment: Faces Faces Pain Scale: Hurts little more Pain Location: LUE only with activity Pain  Descriptors / Indicators: Discomfort, Aching Pain Intervention(s): Limited activity within patient's tolerance, Monitored during session, Repositioned    Home Living                          Prior Function            PT Goals (current goals can now be found in the care plan section) Progress towards PT goals: Progressing toward goals    Frequency    Min 1X/week      PT Plan Current plan remains appropriate    Co-evaluation              AM-PAC PT "6 Clicks" Mobility   Outcome Measure  Help needed turning from your back to your side while in a  flat bed without using bedrails?: A Little Help needed moving from lying on your back to sitting on the side of a flat bed without using bedrails?: A Little Help needed moving to and from a bed to a chair (including a wheelchair)?: A Little Help needed standing up from a chair using your arms (e.g., wheelchair or bedside chair)?: A Little Help needed to walk in hospital room?: A Lot Help needed climbing 3-5 steps with a railing? : Total 6 Click Score: 15    End of Session Equipment Utilized During Treatment: Gait belt Activity Tolerance: Patient tolerated treatment well Patient left: in chair;with call bell/phone within reach;with family/visitor present;with chair alarm set   PT Visit Diagnosis: Pain;History of falling (Z91.81);Repeated falls (R29.6);Difficulty in walking, not elsewhere classified (R26.2) Pain - Right/Left: Left Pain - part of body: Arm     Time: 1330-1350 PT Time Calculation (min) (ACUTE ONLY): 20 min  Charges:    $Gait Training: 8-22 mins PT General Charges $$ ACUTE PT VISIT: 1 Visit                         Faye Ramsay, PT Acute Rehabilitation  Office: [phone]

## 2023-06-06 NOTE — TOC Progression Note (Addendum)
Transition of Care [facility]) - Progression Note    Patient Details  Name: Mandi Sholes MRN: [id number] Date of Birth: 1933-09-22  [facility]) CM/SW Contact  Howell Rucks, RN Phone Number: 06/06/2023, 10:39 AM  Clinical Narrative:   Call received from pt's son [name]), advised accepted [facility] for short term rehab. NCM will initiate insurance auth, await authorization.   -11:09am [facility] auth initiated via [place]. Auth ID: [id number], pending authorization.     Expected Discharge Plan: Skilled Nursing Facility Barriers to Discharge: Continued Medical Work up  Expected Discharge Plan and Services   Discharge Planning Services: CM Consult   Living arrangements for the past 2 months: Single Family Home Expected Discharge Date: 06/05/23                                     Social Determinants of Health (SDOH) Interventions SDOH Screenings   Food Insecurity: No Food Insecurity (06/01/2023)  Housing: Low Risk  (06/01/2023)  Transportation Needs: No Transportation Needs (06/01/2023)  Utilities: Not At Risk (06/01/2023)  Depression (PHQ2-9): Medium Risk (05/14/2023)  Tobacco Use: Medium Risk (06/01/2023)    Readmission Risk Interventions     No data to display

## 2023-06-06 NOTE — Plan of Care (Signed)

## 2023-06-06 NOTE — Progress Notes (Signed)
PROGRESS NOTE    Penny [name]  ZOX:[id number]  DOB: 1933/02/17  DOA: 06/01/2023 PCP: Lucky Cowboy, MD Outpatient Specialists:   Hospital course:  89 y.o. female past medical history significant for COPD, allergic rhinitis depression admitted to hospital hospital for traumatic intracranial hemorrhage CT of the head showed punctuated foci of acute intraparenchymal hemorrhage within the right frontal cortex without any midline shift cervical C-spine showed no evidence of acute fracture or subluxation.  The case was discussed with neurosurgery Dr. Conchita Paris who recommended repeated CT. repeated CT scan shows stable hemorrhage no midline shift.      Subjective:  Patient is waiting to go to rehab, is wondering what is taking so long.  She feels okay but would like to get started to "start feeling stronger".  Notes she lives with her daughter who is presently out of town and with her broken arm she really cannot do very much also feels she is weaker than usual.   Objective: Vitals:   06/06/23 0500 06/06/23 0520 06/06/23 0812 06/06/23 1320  BP:  (!) 142/74  124/84  Pulse:  88  (!) 108  Resp:  20  18  Temp:  97.8 F (36.6 C)  97.7 F (36.5 C)  TempSrc:  Oral  Oral  SpO2:  95% 98% 95%  Weight: 62.8 kg     Height:        Intake/Output Summary (Last 24 hours) at 06/06/2023 1804 Last data filed at 06/06/2023 1300 Gross per 24 hour  Intake 480 ml  Output --  Net 480 ml   Filed Weights   06/04/23 0500 06/05/23 0500 06/06/23 0500  Weight: 60.1 kg 62.7 kg 62.8 kg     Exam:  General: Well-appearing female sitting up in bed in NAD Eyes: sclera anicteric, conjuctiva mild injection bilaterally CVS: S1-S2, regular  Respiratory:  decreased air entry bilaterally secondary to decreased inspiratory effort GI: NABS, soft, NT  LE: Warm and well-perfused Neuro: A/O x 3,  grossly nonfocal.  Psych: patient is logical and coherent, judgement and insight appear normal, mood and  affect appropriate to situation.  Data Reviewed:  Basic Metabolic Panel: Recent Labs  Lab 06/01/23 0239 06/01/23 0938  NA 137 139  K 2.9* 3.7  CL 104 106  CO2 24 25  GLUCOSE 172* 130*  BUN 13 11  CREATININE 0.71 0.62  CALCIUM 9.1 8.7*  MG 1.5*  --     CBC: Recent Labs  Lab 06/01/23 0239 06/01/23 0938  WBC 10.0 6.7  NEUTROABS 8.5* 4.8  HGB 11.9* 11.6*  HCT 38.1 36.8  MCV 87.4 87.2  PLT 183 194     Scheduled Meds:  buPROPion  75 mg Oral Daily   mometasone-formoterol  2 puff Inhalation BID   montelukast  10 mg Oral QHS   umeclidinium bromide  1 puff Inhalation Daily   Continuous Infusions:   Assessment & Plan:   No change in therapeutic regimen as initiated Continue present regimen Patient is awaiting placement in SNF  Copied and placed in from previous note: Traumatic intracranial hemorrhage: Neurosurgery was curb sided and they just recommended to repeat the CT scan of the head which is unchanged from previous and midline shift. Her blood pressure were controlled and hydralazine was used when blood pressure was greater than 160 her antiplatelet therapy were held she will resume these as an outpatient.   Acute left humeral fracture: Imaging showed new acute humeral shaft fracture case discussed with orthopedic surgery [facility] who recommended  splint and immobilization and follow-up with them as an outpatient. She was started on narcotics for pain and MiraLAX for bowel regimen.   Accidental mechanical fall: PT OT evaluated the patient, she will need skilled nursing facility.   Hypokalemia: Replete orally now improved.   COPD: Noted continue current medications no changes made.      DVT prophylaxis: SCD Code Status: Full Family Communication: None today      Studies: No results found.  Principal Problem:   Intracranial hemorrhage (HCC) Active Problems:   Hypokalemia   Closed fracture of left proximal humerus   Fall at home, initial  encounter   History of COPD   Allergic rhinitis   Depression   Intraparenchymal hematoma of brain (HCC)   Closed fracture of shaft of left humerus     Yandell Mcjunkins Orma Flaming, [facility]  If 7PM-7AM, please contact night-coverage [email or web address]   LOS: 0 days

## 2023-06-07 DIAGNOSIS — I629 Nontraumatic intracranial hemorrhage, unspecified: Secondary | ICD-10-CM | POA: Diagnosis not present

## 2023-06-07 DIAGNOSIS — Z743 Need for continuous supervision: Secondary | ICD-10-CM | POA: Diagnosis not present

## 2023-06-07 DIAGNOSIS — I10 Essential (primary) hypertension: Secondary | ICD-10-CM | POA: Diagnosis not present

## 2023-06-07 DIAGNOSIS — Z87891 Personal history of nicotine dependence: Secondary | ICD-10-CM | POA: Diagnosis not present

## 2023-06-07 DIAGNOSIS — Z9181 History of falling: Secondary | ICD-10-CM | POA: Diagnosis not present

## 2023-06-07 DIAGNOSIS — Z7982 Long term (current) use of aspirin: Secondary | ICD-10-CM | POA: Diagnosis not present

## 2023-06-07 DIAGNOSIS — S42302A Unspecified fracture of shaft of humerus, left arm, initial encounter for closed fracture: Secondary | ICD-10-CM | POA: Diagnosis not present

## 2023-06-07 DIAGNOSIS — R269 Unspecified abnormalities of gait and mobility: Secondary | ICD-10-CM | POA: Diagnosis not present

## 2023-06-07 DIAGNOSIS — J449 Chronic obstructive pulmonary disease, unspecified: Secondary | ICD-10-CM | POA: Diagnosis not present

## 2023-06-07 DIAGNOSIS — R29898 Other symptoms and signs involving the musculoskeletal system: Secondary | ICD-10-CM | POA: Diagnosis not present

## 2023-06-07 DIAGNOSIS — Z8616 Personal history of COVID-19: Secondary | ICD-10-CM | POA: Diagnosis not present

## 2023-06-07 DIAGNOSIS — I700 Atherosclerosis of aorta: Secondary | ICD-10-CM | POA: Diagnosis not present

## 2023-06-07 DIAGNOSIS — R58 Hemorrhage, not elsewhere classified: Secondary | ICD-10-CM | POA: Diagnosis not present

## 2023-06-07 DIAGNOSIS — S0630AA Unspecified focal traumatic brain injury with loss of consciousness status unknown, initial encounter: Secondary | ICD-10-CM | POA: Diagnosis not present

## 2023-06-07 DIAGNOSIS — E785 Hyperlipidemia, unspecified: Secondary | ICD-10-CM | POA: Diagnosis not present

## 2023-06-07 DIAGNOSIS — M810 Age-related osteoporosis without current pathological fracture: Secondary | ICD-10-CM | POA: Diagnosis not present

## 2023-06-07 DIAGNOSIS — M25512 Pain in left shoulder: Secondary | ICD-10-CM | POA: Diagnosis not present

## 2023-06-07 DIAGNOSIS — E876 Hypokalemia: Secondary | ICD-10-CM | POA: Diagnosis not present

## 2023-06-07 DIAGNOSIS — Z7401 Bed confinement status: Secondary | ICD-10-CM | POA: Diagnosis not present

## 2023-06-07 DIAGNOSIS — S42302D Unspecified fracture of shaft of humerus, left arm, subsequent encounter for fracture with routine healing: Secondary | ICD-10-CM | POA: Diagnosis not present

## 2023-06-07 DIAGNOSIS — Z79899 Other long term (current) drug therapy: Secondary | ICD-10-CM | POA: Diagnosis not present

## 2023-06-07 DIAGNOSIS — S0990XA Unspecified injury of head, initial encounter: Secondary | ICD-10-CM | POA: Diagnosis not present

## 2023-06-07 DIAGNOSIS — C3410 Malignant neoplasm of upper lobe, unspecified bronchus or lung: Secondary | ICD-10-CM | POA: Diagnosis not present

## 2023-06-07 DIAGNOSIS — Z85118 Personal history of other malignant neoplasm of bronchus and lung: Secondary | ICD-10-CM | POA: Diagnosis not present

## 2023-06-07 DIAGNOSIS — J309 Allergic rhinitis, unspecified: Secondary | ICD-10-CM | POA: Diagnosis not present

## 2023-06-07 DIAGNOSIS — F32A Depression, unspecified: Secondary | ICD-10-CM | POA: Diagnosis not present

## 2023-06-07 DIAGNOSIS — S0630AD Unspecified focal traumatic brain injury with loss of consciousness status unknown, subsequent encounter: Secondary | ICD-10-CM | POA: Diagnosis not present

## 2023-06-07 DIAGNOSIS — S06360D Traumatic hemorrhage of cerebrum, unspecified, without loss of consciousness, subsequent encounter: Secondary | ICD-10-CM | POA: Diagnosis not present

## 2023-06-07 DIAGNOSIS — S0636AA Traumatic hemorrhage of cerebrum, unspecified, with loss of consciousness status unknown, initial encounter: Secondary | ICD-10-CM | POA: Diagnosis not present

## 2023-06-07 NOTE — Discharge Summary (Signed)
Physician Discharge Summary  Shane Velic Dobrowski ZOX:[id number] DOB: May 13, 1933 DOA: 06/01/2023  PCP: Lucky Cowboy, MD  Admit date: 06/01/2023 Discharge date: 06/07/2023  Admitted From: Home Disposition:  SNF  Recommendations for Outpatient Follow-up:  Follow up with orthopedic surgery in 1 to 2 weeks for left humeral fracture  Home Health:No Equipment/Devices:None  Discharge Condition:Stable CODE STATUS:Full Diet recommendation: Heart Healthy   Brief/Interim Summary:  89 y.o. female past medical history significant for COPD, allergic rhinitis depression admitted to hospital hospital for traumatic intracranial hemorrhage CT of the head showed punctuated foci of acute intraparenchymal hemorrhage within the right frontal cortex without any midline shift cervical C-spine showed no evidence of acute fracture or subluxation.  The case was discussed with neurosurgery Dr. Conchita Paris who recommended repeated CT. repeated CT scan shows stable hemorrhage no midline shift.   Discharge Diagnoses:  Principal Problem:   Intracranial hemorrhage (HCC) Active Problems:   Hypokalemia   Closed fracture of left proximal humerus   Fall at home, initial encounter   History of COPD   Allergic rhinitis   Depression   Intraparenchymal hematoma of brain (HCC)   Closed fracture of shaft of left humerus  Traumatic intracranial hemorrhage: Neurosurgery was curb sided and they just recommended to repeat the CT scan of the head which is unchanged from previous and midline shift. Her blood pressure were controlled and hydralazine was used when blood pressure was greater than 160 her antiplatelet therapy were held she will resume these as an outpatient.  Acute left humeral fracture: Imaging showed new acute humeral shaft fracture case discussed with orthopedic surgery [facility] who recommended splint and immobilization and follow-up with them as an outpatient. She was started on narcotics for pain and  MiraLAX for bowel regimen.  Accidental mechanical fall: PT OT evaluated the patient, she will need skilled nursing facility.  Hypokalemia: Replete orally now improved.  COPD: Noted continue current medications no changes made.  Allergic rhinitis:  Discharge Instructions  Discharge Instructions     Diet - low sodium heart healthy   Complete by: As directed    Increase activity slowly   Complete by: As directed       Allergies as of 06/07/2023       Reactions   Ace Inhibitors Cough   Calcium-containing Compounds Other (See Comments)   constpation   Lactose Intolerance (gi) Diarrhea   Polysporin [bacitracin-polymyxin B] Rash   Polysporin or neosporin gave her a rash, couldn't remember which        Medication List     TAKE these medications    acetaminophen 325 MG tablet Commonly known as: TYLENOL Take 650 mg by mouth as needed for mild pain or moderate pain.   aspirin 81 MG chewable tablet Chew 81 mg by mouth every evening.   Breztri Aerosphere 160-9-4.8 MCG/ACT Aero Generic drug: Budeson-Glycopyrrol-Formoterol Inhale 1 puff into the lungs in the morning and at bedtime.   buPROPion 75 MG tablet Commonly known as: WELLBUTRIN One tablet once a day   cetirizine 10 MG tablet Commonly known as: ZYRTEC Takes 1 tablet Daily What changed:  how much to take how to take this when to take this additional instructions   Cholecalciferol 125 MCG (5000 UT) capsule Take 1 capsules Daily   diphenhydramine-acetaminophen 25-500 MG Tabs tablet Commonly known as: TYLENOL PM Take 1 tablet by mouth at bedtime as needed (prn).   HYDROcodone-acetaminophen 5-325 MG tablet Commonly known as: NORCO/VICODIN Take 1 tablet by mouth every 6 (six) hours as  needed for up to 5 days for moderate pain.   montelukast 10 MG tablet Commonly known as: SINGULAIR Take  1 tablet  Daily for Allergies                                                            /                             TAKE                      BY                     MOUTH   Vitamin B-12 1000 MCG Subl Takes 1 tab SL Daily        Contact information for after-discharge care     Destination     [facility] .   Service: Skilled Nursing Contact information: [address] [place] [address] [phone]                    Allergies  Allergen Reactions   Ace Inhibitors Cough   Calcium-Containing Compounds Other (See Comments)    constpation   Lactose Intolerance (Gi) Diarrhea   Polysporin [Bacitracin-Polymyxin B] Rash    Polysporin or neosporin gave her a rash, couldn't remember which    Consultations: None   Procedures/Studies: CT HEAD WO CONTRAST ( )  Result Date: 06/01/2023 CLINICAL DATA:  Follow-up hemorrhage. EXAM: CT HEAD WITHOUT CONTRAST TECHNIQUE: Contiguous axial images were obtained from the base of the skull through the vertex without intravenous contrast. RADIATION DOSE REDUCTION: This exam was performed according to the departmental dose-optimization program which includes automated exposure control, adjustment of the mA and/or kV according to patient size and/or use of iterative reconstruction technique. COMPARISON:  06/01/2023 at 3:16 a.m. and 11/13/2022. FINDINGS: Brain: Examination demonstrates continued evidence of 3 mm hypodense focus over the right frontal region which may be parenchymal versus subarachnoid in location. This is unchanged in size, but slightly less dense compared to the recent prior study. No additional sites of hemorrhage. Remainder of the exam is unchanged to include moderate chronic ischemic microvascular disease. Vascular: No hyperdense vessel or unexpected calcification. Skull: Normal. Negative for fracture or focal lesion. Sinuses/Orbits: No acute finding. Other: None. IMPRESSION: 1. Continued evidence of 3 mm hypodense focus over the right frontal region which may be parenchymal versus subarachnoid in  location. This is unchanged in size, but slightly less dense compared to the recent prior study. No additional sites of hemorrhage. 2. Moderate chronic ischemic microvascular disease. Electronically Signed   By: Elberta Fortis M.D.   On: 06/01/2023 11:48   CT Head Wo Contrast  Result Date: 06/01/2023 CLINICAL DATA:  Head trauma, moderate-severe; Neck trauma (Age >= 65y), Unwitnessed fall EXAM: CT HEAD WITHOUT CONTRAST CT CERVICAL SPINE WITHOUT CONTRAST TECHNIQUE: Multidetector CT imaging of the head and cervical spine was performed following the standard protocol without intravenous contrast. Multiplanar CT image reconstructions of the cervical spine were also generated. RADIATION DOSE REDUCTION: This exam was performed according to the departmental dose-optimization program which includes automated exposure control, adjustment of the mA and/or kV according to patient size and/or use  of iterative reconstruction technique. COMPARISON:  11/13/2022 FINDINGS: CT HEAD FINDINGS Brain: A punctate focus of acute intraparenchymal hemorrhage is seen within the right frontal cortex measuring 3 mm in diameter at axial image # 27/3. No associated abnormal mass effect. No midline shift. Mild parenchymal volume loss is commensurate with the patient's age. Extensive periventricular and subcortical white matter changes are again identified, stable since prior examination, possibly reflecting sequela of Uhls vessel ischemia though demyelinating disease or prior radiation therapy could appear similarly. No acute infarct. No abnormal intra or extra-axial mass lesion. Ventricular size is normal. Cerebellum is unremarkable. Vascular: No hyperdense vessel or unexpected calcification. Skull: Normal. Negative for fracture or focal lesion. Sinuses/Orbits: No acute finding. Other: Mastoid air cells and middle ear cavities are clear. CT CERVICAL SPINE FINDINGS Alignment: There is straightening of the cervical spine. Stable 2 mm  anterolisthesis C3-4 and 2 mm retrolisthesis C6-7. No acute traumatic listhesis identified. Skull base and vertebrae: Craniocervical alignment is normal. The atlantodental interval is not widened. No acute fracture of the cervical spine. Vertebral body height is preserved. Soft tissues and spinal canal: No prevertebral fluid or swelling. No visible canal hematoma. Disc levels: There is intervertebral disc space narrowing, endplate remodeling, and disc calcification throughout the cervical spine in keeping with changes of diffuse advanced degenerative disc disease. Multilevel uncovertebral and facet arthrosis results in multilevel moderate to severe neuroforaminal narrowing, most severe on the left at C4-5. Prevertebral soft tissues are not thickened on sagittal reformats. No high-grade canal stenosis. Upper chest: Negative. Other: None IMPRESSION: 1. Punctate focus of acute intraparenchymal hemorrhage within the right frontal cortex. No associated abnormal mass effect. No midline shift. 2. No acute fracture or listhesis of the cervical spine. 3. Advanced degenerative disc and degenerative joint disease resulting in multilevel moderate to severe neuroforaminal narrowing, most severe on the left at C4-5. These results were called by telephone at the time of interpretation on 06/01/2023 at 3:50 am to provider [facility] , who verbally acknowledged these results. Electronically Signed   By: Helyn Numbers M.D.   On: 06/01/2023 03:50   CT Cervical Spine Wo Contrast  Result Date: 06/01/2023 CLINICAL DATA:  Head trauma, moderate-severe; Neck trauma (Age >= 65y), Unwitnessed fall EXAM: CT HEAD WITHOUT CONTRAST CT CERVICAL SPINE WITHOUT CONTRAST TECHNIQUE: Multidetector CT imaging of the head and cervical spine was performed following the standard protocol without intravenous contrast. Multiplanar CT image reconstructions of the cervical spine were also generated. RADIATION DOSE REDUCTION: This exam was performed  according to the departmental dose-optimization program which includes automated exposure control, adjustment of the mA and/or kV according to patient size and/or use of iterative reconstruction technique. COMPARISON:  11/13/2022 FINDINGS: CT HEAD FINDINGS Brain: A punctate focus of acute intraparenchymal hemorrhage is seen within the right frontal cortex measuring 3 mm in diameter at axial image # 27/3. No associated abnormal mass effect. No midline shift. Mild parenchymal volume loss is commensurate with the patient's age. Extensive periventricular and subcortical white matter changes are again identified, stable since prior examination, possibly reflecting sequela of Rupert vessel ischemia though demyelinating disease or prior radiation therapy could appear similarly. No acute infarct. No abnormal intra or extra-axial mass lesion. Ventricular size is normal. Cerebellum is unremarkable. Vascular: No hyperdense vessel or unexpected calcification. Skull: Normal. Negative for fracture or focal lesion. Sinuses/Orbits: No acute finding. Other: Mastoid air cells and middle ear cavities are clear. CT CERVICAL SPINE FINDINGS Alignment: There is straightening of the cervical spine. Stable 2 mm anterolisthesis  C3-4 and 2 mm retrolisthesis C6-7. No acute traumatic listhesis identified. Skull base and vertebrae: Craniocervical alignment is normal. The atlantodental interval is not widened. No acute fracture of the cervical spine. Vertebral body height is preserved. Soft tissues and spinal canal: No prevertebral fluid or swelling. No visible canal hematoma. Disc levels: There is intervertebral disc space narrowing, endplate remodeling, and disc calcification throughout the cervical spine in keeping with changes of diffuse advanced degenerative disc disease. Multilevel uncovertebral and facet arthrosis results in multilevel moderate to severe neuroforaminal narrowing, most severe on the left at C4-5. Prevertebral soft tissues  are not thickened on sagittal reformats. No high-grade canal stenosis. Upper chest: Negative. Other: None IMPRESSION: 1. Punctate focus of acute intraparenchymal hemorrhage within the right frontal cortex. No associated abnormal mass effect. No midline shift. 2. No acute fracture or listhesis of the cervical spine. 3. Advanced degenerative disc and degenerative joint disease resulting in multilevel moderate to severe neuroforaminal narrowing, most severe on the left at C4-5. These results were called by telephone at the time of interpretation on 06/01/2023 at 3:50 am to provider [facility] , who verbally acknowledged these results. Electronically Signed   By: Helyn Numbers M.D.   On: 06/01/2023 03:50   DG Pelvis 1-2 Views  Result Date: 06/01/2023 CLINICAL DATA:  Status post fall. EXAM: PELVIS - 1-2 VIEW COMPARISON:  None Available. FINDINGS: There is no evidence of an acute pelvic fracture or diastasis. No pelvic bone lesions are seen. Moderate severity degenerative changes seen involving both hips, in the form of joint space narrowing and acetabular sclerosis. IMPRESSION: Moderate severity degenerative changes involving both hips. Electronically Signed   By: Aram Candela M.D.   On: 06/01/2023 03:45   DG Knee Complete 4 Views Left  Result Date: 06/01/2023 CLINICAL DATA:  Status post fall. EXAM: LEFT KNEE - COMPLETE 4+ VIEW COMPARISON:  None Available. FINDINGS: No evidence of fracture, dislocation, or joint effusion. Marked severity medial and lateral chondrocalcinosis is seen. Soft tissues are unremarkable. IMPRESSION: Marked severity chondrocalcinosis. Electronically Signed   By: Aram Candela M.D.   On: 06/01/2023 03:44   DG Wrist Complete Left  Result Date: 06/01/2023 CLINICAL DATA:  Status post fall. EXAM: LEFT WRIST - COMPLETE 3+ VIEW COMPARISON:  None Available. FINDINGS: There is no evidence of an acute fracture or dislocation. Marked severity degenerative changes are seen along the  distal aspect of the left wrist, most prominent along the first carpometacarpal joint. Soft tissues are unremarkable. IMPRESSION: Marked severity degenerative changes without an acute fracture or dislocation. Electronically Signed   By: Aram Candela M.D.   On: 06/01/2023 03:43   DG Elbow Complete Left  Result Date: 06/01/2023 CLINICAL DATA:  Status post fall. EXAM: LEFT ELBOW - COMPLETE 3+ VIEW COMPARISON:  None Available. FINDINGS: There is an acute fracture deformity involving the visualized portion of the proximal left humeral shaft. There is no evidence of dislocation. There is no evidence of arthropathy or other focal bone abnormality. Soft tissues are unremarkable. IMPRESSION: Acute fracture of the proximal left humeral shaft. Electronically Signed   By: Aram Candela M.D.   On: 06/01/2023 03:42   DG Shoulder Left  Result Date: 06/01/2023 CLINICAL DATA:  Status post fall. EXAM: LEFT SHOULDER - 2+ VIEW COMPARISON:  None Available. FINDINGS: There is an acute fracture deformity involving the proximal left humeral shaft. There is no evidence of dislocation. Degenerative changes are seen involving the left acromioclavicular joint. Soft tissue swelling is seen surrounding the previously  noted fracture site. IMPRESSION: Acute fracture of the proximal left humeral shaft. Electronically Signed   By: Aram Candela M.D.   On: 06/01/2023 03:41   DG Humerus Left  Result Date: 06/01/2023 CLINICAL DATA:  Status post fall. EXAM: LEFT HUMERUS - 2+ VIEW COMPARISON:  None Available. FINDINGS: There is an acute fracture of the proximal left humeral shaft. Approximately 1-2 shaft width medial displacement of the distal fracture site is seen. There is no evidence of dislocation. Moderate severity soft tissue swelling is seen along the previously noted fracture site. IMPRESSION: Acute fracture of the proximal left humeral shaft. Electronically Signed   By: Aram Candela M.D.   On: 06/01/2023 03:40    DG Chest 2 View  Result Date: 06/01/2023 CLINICAL DATA:  Status post fall. EXAM: CHEST - 2 VIEW COMPARISON:  November 13, 2022 FINDINGS: The lateral views limited in evaluation secondary to positioning of the patient's left upper extremity. The heart size and mediastinal contours are within normal limits. Both lungs are clear. An acute, mildly displaced fracture deformity is seen involving the proximal left humeral shaft. Multiple chronic right-sided rib fractures are noted. IMPRESSION: 1. No active cardiopulmonary disease. 2. Acute, mildly displaced fracture of the proximal left humeral shaft. Electronically Signed   By: Aram Candela M.D.   On: 06/01/2023 03:39    Subjective: No complaints  Discharge Exam: Vitals:   06/07/23 0827 06/07/23 1324  BP: 135/84 107/66  Pulse: 92 97  Resp: 20 18  Temp: 97.8 F (36.6 C) 97.7 F (36.5 C)  SpO2: 98% 94%   Vitals:   06/07/23 0500 06/07/23 0824 06/07/23 0827 06/07/23 1324  BP:   135/84 107/66  Pulse:   92 97  Resp:   20 18  Temp:   97.8 F (36.6 C) 97.7 F (36.5 C)  TempSrc:   Oral Oral  SpO2:  98% 98% 94%  Weight: 61.4 kg     Height:        General: Pt is alert, awake, not in acute distress Cardiovascular: RRR, S1/S2 +, no rubs, no gallops Respiratory: CTA bilaterally, no wheezing, no rhonchi Abdominal: Soft, NT, ND, bowel sounds + Extremities: no edema, no cyanosis    The results of significant diagnostics from this hospitalization (including imaging, microbiology, ancillary and laboratory) are listed below for reference.     Microbiology: No results found for this or any previous visit (from the past 240 hour(s)).   Labs: BNP (last 3 results) No results for input(s): "BNP" in the last 8760 hours. Basic Metabolic Panel: Recent Labs  Lab 06/01/23 0239 06/01/23 0938  NA 137 139  K 2.9* 3.7  CL 104 106  CO2 24 25  GLUCOSE 172* 130*  BUN 13 11  CREATININE 0.71 0.62  CALCIUM 9.1 8.7*  MG 1.5*  --     Liver  Function Tests: Recent Labs  Lab 06/01/23 0938  AST 24  ALT 15  ALKPHOS 60  BILITOT 0.8  PROT 6.4*  ALBUMIN 3.6    No results for input(s): "LIPASE", "AMYLASE" in the last 168 hours. No results for input(s): "AMMONIA" in the last 168 hours. CBC: Recent Labs  Lab 06/01/23 0239 06/01/23 0938  WBC 10.0 6.7  NEUTROABS 8.5* 4.8  HGB 11.9* 11.6*  HCT 38.1 36.8  MCV 87.4 87.2  PLT 183 194    Cardiac Enzymes: Recent Labs  Lab 06/01/23 0239  CKTOTAL 224    BNP: Invalid input(s): "POCBNP" CBG: No results for input(s): "GLUCAP" in the  last 168 hours. D-Dimer No results for input(s): "DDIMER" in the last 72 hours. Hgb A1c No results for input(s): "HGBA1C" in the last 72 hours. Lipid Profile No results for input(s): "CHOL", "HDL", "LDLCALC", "TRIG", "CHOLHDL", "LDLDIRECT" in the last 72 hours. Thyroid function studies No results for input(s): "TSH", "T4TOTAL", "T3FREE", "THYROIDAB" in the last 72 hours.  Invalid input(s): "FREET3" Anemia work up No results for input(s): "VITAMINB12", "FOLATE", "FERRITIN", "TIBC", "IRON", "RETICCTPCT" in the last 72 hours. Urinalysis    Component Value Date/Time   COLORURINE STRAW (A) 06/01/2023 0751   APPEARANCEUR CLEAR 06/01/2023 0751   LABSPEC 1.009 06/01/2023 0751   PHURINE 7.0 06/01/2023 0751   GLUCOSEU NEGATIVE 06/01/2023 0751   HGBUR NEGATIVE 06/01/2023 0751   BILIRUBINUR NEGATIVE 06/01/2023 0751   KETONESUR NEGATIVE 06/01/2023 0751   PROTEINUR NEGATIVE 06/01/2023 0751   NITRITE NEGATIVE 06/01/2023 0751   LEUKOCYTESUR NEGATIVE 06/01/2023 0751   Sepsis Labs Recent Labs  Lab 06/01/23 0239 06/01/23 0938  WBC 10.0 6.7    Microbiology No results found for this or any previous visit (from the past 240 hour(s)).   SIGNED:   Pieter Partridge, MD  [facility] 06/07/2023, 1:31 PM Pager   If 7PM-7AM, please contact night-coverage [email or web address] Password [email or web address]

## 2023-06-07 NOTE — Progress Notes (Signed)
Report called to the facility to Robin. All questions answered

## 2023-06-07 NOTE — Progress Notes (Signed)
Pt d/c to the facility with her belongings and with PTAR. Family informed when pt left the unit.

## 2023-06-07 NOTE — TOC Progression Note (Addendum)
Transition of Care [facility]) - Progression Note    Patient Details  Name: Penny [name] MRN: [id number] Date of Birth: 03-15-1933  [facility] CM/SW Contact  Howell Rucks, RN Phone Number: 06/07/2023, 12:58 PM  Clinical Narrative:  Insurance  Geographical information systems officer per [employer]: Auth# [id number], approved 7/3 to 06/08/2023. Team notified.   -1:12pm Call to Tammy at [facility], informed of insurance approval for short term rehab-SNF, reports bed available today, RM 308B, Nurse Call Report: main facility number: [phone] , ask to page nurse to give report. NCM outreached to pt's sone, [name], reports he is in the hospital and agreeable to transfer today.     Expected Discharge Plan: Skilled Nursing Facility Barriers to Discharge: Continued Medical Work up  Expected Discharge Plan and Services   Discharge Planning Services: CM Consult   Living arrangements for the past 2 months: Single Family Home Expected Discharge Date: 06/05/23                                     Social Determinants of Health (SDOH) Interventions SDOH Screenings   Food Insecurity: No Food Insecurity (06/01/2023)  Housing: Low Risk  (06/01/2023)  Transportation Needs: No Transportation Needs (06/01/2023)  Utilities: Not At Risk (06/01/2023)  Depression (PHQ2-9): Medium Risk (05/14/2023)  Tobacco Use: Medium Risk (06/01/2023)    Readmission Risk Interventions     No data to display

## 2023-06-07 NOTE — TOC Transition Note (Signed)
Transition of Care [facility]) - CM/SW Discharge Note   Patient Details  Name: Penny [name] MRN: [id number] Date of Birth: 02-Jan-1933  Transition of Care [facility]) CM/SW Contact:  Howell Rucks, RN Phone Number: 06/07/2023, 2:01 PM   Clinical Narrative:  DC order to [facility], Tammy at facility notified of insurance and reports bed available to day, RM 303-B, Nurse call report to main facility number [phone], ask to page nurse for report. Pt's son [name] notified of transfer. PTAR called. No further TOC needs identified.      Final next level of care: Skilled Nursing Facility Barriers to Discharge: Barriers Resolved   Patient Goals and CMS Choice CMS Medicare.gov Compare Post Acute Care list provided to:: Patient Represenative (must comment) Cieanna Yokota (son)) Choice offered to / list presented to : Adult Children Francessca January (son))  Discharge Placement                Patient chooses bed at: [facility] Patient to be transferred to facility by: PTAR Name of family member notified: [name] (son) Patient and family notified of of transfer: 06/07/23  Discharge Plan and Services Additional resources added to the After Visit Summary for     Discharge Planning Services: CM Consult                                 Social Determinants of Health (SDOH) Interventions SDOH Screenings   Food Insecurity: No Food Insecurity (06/01/2023)  Housing: Low Risk  (06/01/2023)  Transportation Needs: No Transportation Needs (06/01/2023)  Utilities: Not At Risk (06/01/2023)  Depression (PHQ2-9): Medium Risk (05/14/2023)  Tobacco Use: Medium Risk (06/01/2023)     Readmission Risk Interventions     No data to display

## 2023-06-11 ENCOUNTER — Ambulatory Visit: Payer: Medicare Other | Admitting: Physical Therapy

## 2023-06-12 ENCOUNTER — Ambulatory Visit: Payer: Medicare Other | Admitting: Nurse Practitioner

## 2023-06-12 DIAGNOSIS — M810 Age-related osteoporosis without current pathological fracture: Secondary | ICD-10-CM | POA: Diagnosis not present

## 2023-06-12 DIAGNOSIS — S0630AD Unspecified focal traumatic brain injury with loss of consciousness status unknown, subsequent encounter: Secondary | ICD-10-CM | POA: Diagnosis not present

## 2023-06-12 DIAGNOSIS — I10 Essential (primary) hypertension: Secondary | ICD-10-CM | POA: Diagnosis not present

## 2023-06-12 DIAGNOSIS — Z9181 History of falling: Secondary | ICD-10-CM | POA: Diagnosis not present

## 2023-06-12 DIAGNOSIS — E785 Hyperlipidemia, unspecified: Secondary | ICD-10-CM | POA: Diagnosis not present

## 2023-06-12 DIAGNOSIS — J309 Allergic rhinitis, unspecified: Secondary | ICD-10-CM | POA: Diagnosis not present

## 2023-06-12 DIAGNOSIS — J449 Chronic obstructive pulmonary disease, unspecified: Secondary | ICD-10-CM | POA: Diagnosis not present

## 2023-06-12 DIAGNOSIS — I700 Atherosclerosis of aorta: Secondary | ICD-10-CM | POA: Diagnosis not present

## 2023-06-12 DIAGNOSIS — S42302D Unspecified fracture of shaft of humerus, left arm, subsequent encounter for fracture with routine healing: Secondary | ICD-10-CM | POA: Diagnosis not present

## 2023-06-13 ENCOUNTER — Ambulatory Visit: Payer: Medicare Other | Admitting: Physical Therapy

## 2023-06-18 ENCOUNTER — Ambulatory Visit: Payer: Medicare Other | Admitting: Physical Therapy

## 2023-06-18 DIAGNOSIS — M25512 Pain in left shoulder: Secondary | ICD-10-CM | POA: Diagnosis not present

## 2023-06-20 ENCOUNTER — Telehealth: Payer: Self-pay | Admitting: Internal Medicine

## 2023-06-20 NOTE — Telephone Encounter (Signed)
[facility] called and stated;  Patient's son is requesting an order for a transport chair. Advised that we need to evaluate face to face first. Patient was hospitalized and discharged to skilled care.

## 2023-06-21 ENCOUNTER — Ambulatory Visit: Payer: Medicare Other | Admitting: Physical Therapy

## 2023-06-22 DIAGNOSIS — M810 Age-related osteoporosis without current pathological fracture: Secondary | ICD-10-CM | POA: Diagnosis not present

## 2023-06-22 DIAGNOSIS — S0630AA Unspecified focal traumatic brain injury with loss of consciousness status unknown, initial encounter: Secondary | ICD-10-CM | POA: Diagnosis not present

## 2023-06-22 DIAGNOSIS — I700 Atherosclerosis of aorta: Secondary | ICD-10-CM | POA: Diagnosis not present

## 2023-06-22 DIAGNOSIS — E785 Hyperlipidemia, unspecified: Secondary | ICD-10-CM | POA: Diagnosis not present

## 2023-06-22 DIAGNOSIS — I10 Essential (primary) hypertension: Secondary | ICD-10-CM | POA: Diagnosis not present

## 2023-06-22 DIAGNOSIS — J309 Allergic rhinitis, unspecified: Secondary | ICD-10-CM | POA: Diagnosis not present

## 2023-06-22 DIAGNOSIS — Z9181 History of falling: Secondary | ICD-10-CM | POA: Diagnosis not present

## 2023-06-22 DIAGNOSIS — J449 Chronic obstructive pulmonary disease, unspecified: Secondary | ICD-10-CM | POA: Diagnosis not present

## 2023-06-22 DIAGNOSIS — S42302D Unspecified fracture of shaft of humerus, left arm, subsequent encounter for fracture with routine healing: Secondary | ICD-10-CM | POA: Diagnosis not present

## 2023-06-25 ENCOUNTER — Ambulatory Visit
Admission: RE | Admit: 2023-06-25 | Discharge: 2023-06-25 | Disposition: A | Discharge status: Home / Self Care | Payer: Medicare Other | Source: Ambulatory Visit | Attending: Neurology | Admitting: Neurology

## 2023-06-25 DIAGNOSIS — R29898 Other symptoms and signs involving the musculoskeletal system: Secondary | ICD-10-CM

## 2023-06-25 DIAGNOSIS — C3410 Malignant neoplasm of upper lobe, unspecified bronchus or lung: Secondary | ICD-10-CM

## 2023-06-25 DIAGNOSIS — S0990XA Unspecified injury of head, initial encounter: Secondary | ICD-10-CM

## 2023-06-25 DIAGNOSIS — Z85118 Personal history of other malignant neoplasm of bronchus and lung: Secondary | ICD-10-CM | POA: Diagnosis not present

## 2023-06-25 DIAGNOSIS — R269 Unspecified abnormalities of gait and mobility: Secondary | ICD-10-CM | POA: Diagnosis not present

## 2023-06-25 MED ORDER — GADOPICLENOL 0.5 MMOL/ML IV SOLN
6.0000 mL | Freq: Once | INTRAVENOUS | Status: AC | PRN
  Administered 2023-06-25: 6.5 mL via INTRAVENOUS

## 2023-06-27 NOTE — Progress Notes (Signed)
More severe microvascular changes in the brain  and advanced brain atrophy with micro bleeds.  This is a good explanation for movement slowing, balance  insecurity, and can correlate with cognitive changes as well.

## 2023-06-28 ENCOUNTER — Ambulatory Visit: Payer: Medicare Other | Admitting: Nurse Practitioner

## 2023-06-28 DIAGNOSIS — I10 Essential (primary) hypertension: Secondary | ICD-10-CM | POA: Diagnosis not present

## 2023-06-28 DIAGNOSIS — J449 Chronic obstructive pulmonary disease, unspecified: Secondary | ICD-10-CM | POA: Diagnosis not present

## 2023-06-28 DIAGNOSIS — Z9181 History of falling: Secondary | ICD-10-CM | POA: Diagnosis not present

## 2023-06-28 DIAGNOSIS — S06360D Traumatic hemorrhage of cerebrum, unspecified, without loss of consciousness, subsequent encounter: Secondary | ICD-10-CM | POA: Diagnosis not present

## 2023-06-28 DIAGNOSIS — M80022D Age-related osteoporosis with current pathological fracture, left humerus, subsequent encounter for fracture with routine healing: Secondary | ICD-10-CM | POA: Diagnosis not present

## 2023-07-02 ENCOUNTER — Ambulatory Visit (HOSPITAL_BASED_OUTPATIENT_CLINIC_OR_DEPARTMENT_OTHER): Payer: Medicare Other | Admitting: Cardiology

## 2023-07-02 ENCOUNTER — Encounter (HOSPITAL_BASED_OUTPATIENT_CLINIC_OR_DEPARTMENT_OTHER): Payer: Self-pay | Admitting: Cardiology

## 2023-07-02 VITALS — BP 115/87 | HR 100 | Ht 62.0 in | Wt 132.8 lb

## 2023-07-02 DIAGNOSIS — I2510 Atherosclerotic heart disease of native coronary artery without angina pectoris: Secondary | ICD-10-CM

## 2023-07-02 DIAGNOSIS — R001 Bradycardia, unspecified: Secondary | ICD-10-CM

## 2023-07-02 DIAGNOSIS — E782 Mixed hyperlipidemia: Secondary | ICD-10-CM | POA: Diagnosis not present

## 2023-07-02 DIAGNOSIS — T50905A Adverse effect of unspecified drugs, medicaments and biological substances, initial encounter: Secondary | ICD-10-CM

## 2023-07-02 DIAGNOSIS — I700 Atherosclerosis of aorta: Secondary | ICD-10-CM | POA: Diagnosis not present

## 2023-07-02 NOTE — Patient Instructions (Signed)

## 2023-07-02 NOTE — Progress Notes (Signed)
Cardiology Office Note:  .    Date:  07/02/2023  ID:  Aaron Edelman, DOB 1933/06/08, MRN [id number] PCP: Lucky Cowboy, MD   [facility] Providers Cardiologist:  Jodelle Red, MD     History of Present Illness: .    Penny [name] is a 89 y.o. female with a hx of hyperlipidemia, hypertension, COPD, and lung cancer, who presents for follow-up today.  I met her during her hospitalization in 09/2022 after a syncopal episode.   At her visit 03/2023, she complained of LE weakness and unsteadiness and a mechanical fall tripping over her living room carpet. Overall had no limitations when staying active. She had no pain, just weakness. Was prescribed steroids by Dr. Oneta Rack with significant improvement.    She was admitted to [facility] 06/01/2023 for traumatic intracranial hemorrhage in the setting of a mechanical fall. CT of the head showed punctuated foci of acute intraparenchymal hemorrhage within the right frontal cortex without any midline shift; cervical C-spine showed no evidence of acute fracture or subluxation. Imaging also showed acute humeral shaft fracture. Discharged 7/4 to SNF.  Today, she is accompanied by a family member. We reviewed her recent hospitalization. Since her hospitalization she has been struggling with memory issues. She confirms that she had hit her head. She was able to walk a few steps on her own earlier today; has otherwise required assistance. She has not been able to be physically active during her recovery. On Monday she is scheduled for follow-up with orthopedics.   For the past 2 weeks she has noticed some minor intermittent LE edema.   In the office today her blood pressure is 115/87. No longer on HCTZ or metoprolol.  She has restarted her Vitamin B-12 and D3 supplements. She had been off of them for several months.  She denies any palpitations, chest pain, shortness of breath, lightheadedness, headaches, syncope,  orthopnea, or PND.  ROS:  Please see the history of present illness. ROS otherwise negative except as noted.  (+) BLE weakness (+) Intermittent LE edema  Studies Reviewed: Marland Kitchen         Physical Exam:    VS:  BP 115/87 (BP Location: Right Arm, Patient Position: Sitting, Cuff Size: Normal)   Pulse 100   Ht 5\' 2"  (1.575 m)   Wt 132 lb 12.8 oz (60.2 kg)   SpO2 94%   BMI 24.29 kg/m    Wt Readings from Last 3 Encounters:  07/02/23 132 lb 12.8 oz (60.2 kg)  06/07/23 135 lb 5.8 oz (61.4 kg)  05/21/23 139 lb 12.8 oz (63.4 kg)    GEN: Well nourished, well developed in no acute distress. In wheelchair. HEENT: Normal, moist mucous membranes NECK: No JVD CARDIAC: regular rhythm, normal S1 and S2, no rubs or gallops. No murmur. VASCULAR: Radial and DP pulses 2+ bilaterally. No carotid bruits RESPIRATORY:  Clear to auscultation without rales, wheezing or rhonchi  ABDOMEN: Soft, non-tender, non-distended MUSCULOSKELETAL:  Ambulates independently; Left arm in sling. SKIN: Warm and dry, no edema NEUROLOGIC:  Alert and oriented x 3. No focal neuro deficits noted. PSYCHIATRIC:  Normal affect   ASSESSMENT AND PLAN: .    Hypertension -currently on no meds -with history of syncope, would not be overly aggressive with BP control   Coronary calcification Mixed hyperlipidemia Aortic atherosclerosis -at age 75, unclear significance of this. She is on aspirin. If she has another fall, would stop aspirin -simvastatin stopped due to myalgia   Cardiac risk counseling and  prevention recommendations: -recommend heart healthy/Mediterranean diet, with whole grains, fruits, vegetable, fish, lean meats, nuts, and olive oil. Limit salt. -recommend moderate walking, 3-5 times/week for 30-50 minutes each session. Aim for at least 150 minutes.week. Goal should be pace of 3 miles/hours, or walking 1.5 miles in 30 minutes -recommend avoidance of tobacco products. Avoid excess alcohol.   Prior  issues: Syncope Drug induced sinus bradycardia -stopped metoprolol permanently -no recurrent events  Dispo: Follow-up in 6 months, or sooner as needed.  I,Mathew Stumpf,acting as a Neurosurgeon for [employer], MD.,have documented all relevant documentation on the behalf of Jodelle Red, MD,as directed by  Jodelle Red, MD while in the presence of Jodelle Red, MD.  I, Jodelle Red, MD, have reviewed all documentation for this visit. The documentation on 07/02/23 for the exam, diagnosis, procedures, and orders are all accurate and complete.   Signed, Jodelle Red, MD

## 2023-07-03 DIAGNOSIS — M80022D Age-related osteoporosis with current pathological fracture, left humerus, subsequent encounter for fracture with routine healing: Secondary | ICD-10-CM | POA: Diagnosis not present

## 2023-07-03 DIAGNOSIS — Z9181 History of falling: Secondary | ICD-10-CM | POA: Diagnosis not present

## 2023-07-03 DIAGNOSIS — S06360D Traumatic hemorrhage of cerebrum, unspecified, without loss of consciousness, subsequent encounter: Secondary | ICD-10-CM | POA: Diagnosis not present

## 2023-07-03 DIAGNOSIS — J449 Chronic obstructive pulmonary disease, unspecified: Secondary | ICD-10-CM | POA: Diagnosis not present

## 2023-07-03 DIAGNOSIS — I10 Essential (primary) hypertension: Secondary | ICD-10-CM | POA: Diagnosis not present

## 2023-07-04 ENCOUNTER — Telehealth: Payer: Self-pay | Admitting: Neurology

## 2023-07-04 DIAGNOSIS — Z9181 History of falling: Secondary | ICD-10-CM | POA: Diagnosis not present

## 2023-07-04 DIAGNOSIS — J449 Chronic obstructive pulmonary disease, unspecified: Secondary | ICD-10-CM | POA: Diagnosis not present

## 2023-07-04 DIAGNOSIS — S42302D Unspecified fracture of shaft of humerus, left arm, subsequent encounter for fracture with routine healing: Secondary | ICD-10-CM | POA: Diagnosis not present

## 2023-07-04 DIAGNOSIS — S06360D Traumatic hemorrhage of cerebrum, unspecified, without loss of consciousness, subsequent encounter: Secondary | ICD-10-CM | POA: Diagnosis not present

## 2023-07-04 DIAGNOSIS — E876 Hypokalemia: Secondary | ICD-10-CM | POA: Diagnosis not present

## 2023-07-04 NOTE — Progress Notes (Unsigned)
Hospital follow up  Assessment and Plan: Hospital visit follow up for:    Penny [name] was seen today for follow-up.  Diagnoses and all orders for this visit:  Intracranial hemorrhage (HCC) Follow with neurology  Closed fracture of shaft of left humerus with routine healing, unspecified fracture morphology, subsequent encounter Continue to wear sling Use help for shower and other ADL's Continue to follow with orthopedics Tylenol as needed for pain  Intraparenchymal hematoma of brain, right, without loss of consciousness, subsequent encounter Follow with neurology -     CBC with Differential/Platelet -     COMPLETE METABOLIC PANEL WITH GFR  Hypokalemia - CMP  Pulmonary emphysema, unspecified emphysema type (HCC) Continue meds Monitor symptoms  Essential hypertension Well controlled without medication - continue DASH diet, exercise and monitor at home. Call if greater than 130/80.   Current moderate episode of major depressive disorder, unspecified whether recurrent (HCC) Continue wellbutrin Practice good diet, increase protein Practice good sleep hygiene   Medication management -     CBC with Differential/Platelet -     COMPLETE METABOLIC PANEL WITH GFR -     Magnesium  Impacted cerumen of both ears -     Ear Lavage- unable to clear ears Refer to ENT for cerumen impaction removal     All medications were reviewed with patient and family and fully reconciled. All questions answered fully, and patient and family members were encouraged to call the office with any further questions or concerns. Discussed goal to avoid readmission related to this diagnosis.      Over 40 minutes of exam, counseling, chart review, and complex, high/moderate level critical decision making was performed this visit.   Future Appointments  Date Time Provider Department Center  07/11/2023  2:30 PM Meryl Crutch, [place] [facility] None  07/11/2023  3:30 PM Levert Feinstein, MD [facility] None  08/20/2023  1:30  PM Dohmeier, Porfirio Mylar, MD [facility] None  09/04/2023 10:00 AM Raynelle Dick, NP [facility] None     HPI 89 y.o.female presents for follow up for transition from recent hospitalization or SNIF stay. Admit date to the hospital was 06/01/23, patient was discharged from the hospital on 06/07/23 , was sent to skilled facility and d/c from there 06/25/23 and our clinical staff contacted the office the day after discharge to set up a follow up appointment. The discharge summary, medications, and diagnostic test results were reviewed before meeting with the patient. The patient was admitted for:    On 06/01/23-The patient reports that while ambulating with her walker at home, she tripped, and fell to her left side striking her left shoulder as the principal point of contact with the floor blue, will also noting that she struck her head as a component of this fall.  Not associate any loss of consciousness.  She reported immediate development of sharp, nonradiating left shoulder discomfort, worse with movement of the left upper extremity.  She denies any significant headache, nor any associated acute focal weakness, acute focal numbness, paresthesias, vertigo acute change in vision, dysphagia, slurred speech, facial droop.     Expand All Collapse All  Physician Discharge Summary  Penny [name] ZOX:[id number] DOB: 1933-08-23 DOA: 06/01/2023   PCP: Lucky Cowboy, MD   Admit date: 06/01/2023 Discharge date: 06/07/2023   Admitted From: Home Disposition:  SNF   Recommendations for Outpatient Follow-up:  Follow up with orthopedic surgery in 1 to 2 weeks for left humeral fracture   Home Health:No Equipment/Devices:None   Discharge Condition:Stable CODE STATUS:Full  Diet recommendation: Heart Healthy    Brief/Interim Summary:  89 y.o. female past medical history significant for COPD, allergic rhinitis depression admitted to hospital hospital for traumatic intracranial hemorrhage CT of the head showed  punctuated foci of acute intraparenchymal hemorrhage within the right frontal cortex without any midline shift cervical C-spine showed no evidence of acute fracture or subluxation.  The case was discussed with neurosurgery Dr. Conchita Paris who recommended repeated CT. repeated CT scan shows stable hemorrhage no midline shift.    Discharge Diagnoses:  Principal Problem:   Intracranial hemorrhage (HCC) Active Problems:   Hypokalemia   Closed fracture of left proximal humerus   Fall at home, initial encounter   History of COPD   Allergic rhinitis   Depression   Intraparenchymal hematoma of brain (HCC)   Closed fracture of shaft of left humerus   Traumatic intracranial hemorrhage: Neurosurgery was curb sided and they just recommended to repeat the CT scan of the head which is unchanged from previous and midline shift. Her blood pressure were controlled and hydralazine was used when blood pressure was greater than 160 her antiplatelet therapy were held she will resume these as an outpatient.   Acute left humeral fracture: Imaging showed new acute humeral shaft fracture case discussed with orthopedic surgery [facility] who recommended splint and immobilization and follow-up with them as an outpatient. She was started on narcotics for pain and MiraLAX for bowel regimen.   Accidental mechanical fall: PT OT evaluated the patient, she will need skilled nursing facility.   Hypokalemia: Replete orally now improved.   COPD: Noted continue current medications no changes made.     This morning she was walking to bathroom to wash her face and slipped on bathroom carpet and fell to knees. She then hit her left arm and pain is more severe since that accident. If arm is not moving and stable in sling it does not hurt.   BP is well controlled without medication.  Denies headaches, chest pain, shortness of breath, dizziness and headaches. BP Readings from Last 3 Encounters:  07/05/23 128/82  07/02/23  115/87  06/07/23 107/66   Hearing has gotten much worse in the past week. 1 week ago she heard a pop in both ears and states it then went quiet.   BMI is Body mass index is 24.29 kg/m., she has been working on diet and exercise with PT. Her appetite is fair Wt Readings from Last 3 Encounters:  07/05/23 132 lb 12.8 oz (60.2 kg)  07/02/23 132 lb 12.8 oz (60.2 kg)  06/07/23 135 lb 5.8 oz (61.4 kg)     Home health is involved. PT has been started at home.   Images while in the hospital: MR BRAIN W WO CONTRAST  Result Date: 06/26/2023  [facility] [address] [place], [place] [address] [phone] NEUROIMAGING REPORT STUDY DATE: 06/25/2023 PATIENT NAME: Nanie Humfleet DOB: [birth year] MRN: [id number] EXAM: MRI Brain with and without contrast ORDERING CLINICIAN: Porfirio Mylar Dohmeier MD CLINICAL HISTORY: 89 year old woman with history of lung cancer and gait disturbance COMPARISON FILMS: CT 06/01/2023 TECHNIQUE:MRI of the brain with and without contrast was obtained utilizing 5 mm axial slices with T1, T2, T2 flair, SWI and diffusion weighted views.  T1 sagittal, T2 coronal and postcontrast views in the axial and coronal plane were obtained.  CONTRAST: 6.5 mL [employer] IMAGING SITE: [facility], [address] [place], [place], [place] FINDINGS: On sagittal images, the spinal cord is imaged caudally to C4 and is  normal in caliber.   The contents of the posterior fossa are of normal size and position.   The pituitary gland and optic chiasm appear normal.    There is mild to moderate generalized cortical atrophy and mild corpus callosum atrophy.   There are no abnormal extra-axial collections of fluid.  In the cerebral hemispheres, there are extensive T2/FLAIR confluent hyperintense changes bilaterally predominantly in the deep and subcortical white matter.  A Victor lacunar infarction is noted in the deep white matter of the right frontal lobe.  None of the foci appear to be acute.   T2 hyperintense foci also noted in the pons.  Expanded Virchow-Robin spaces are noted in the basal ganglia bilaterally and T2/FLAIR hyperintense foci are noted in the internal and external capsules.  Diffusion weighted images are normal.    On susceptibility weighted images, there are several adjacent chronic microhemorrhages at the right frontal vertex as well as chronic heme products in an adjacent gyrus.  This could represent discrete cortical hemorrhages and mild subarachnoid hemorrhage.  A couple other chronic microhemorrhages are noted at the gray-white junction elsewhere in the hemispheres There have been bilateral lens replacements.  Otherwise, the orbits appear normal.   The VIIth/VIIIth nerve complex appears normal.  The mastoid air cells appear normal.  The paranasal sinuses appear normal.  Flow voids are identified within the major intracerebral arteries.   There is mild dolichoectasia of the vertebrobasilar system. After the infusion of contrast material, a normal enhancement pattern is noted.   This MRI of the brain with and without contrast shows the following: Mild to moderate generalized cortical atrophy and mild corpus callosum atrophy. Very extensive confluent T2/FLAIR hypertense signal in the hemispheres consistent with severe chronic microvascular ischemic change.  None of the foci appear to be acute. Chronic microhemorrhages noted in the cortex with a Szalkowski chronic subarachnoid hemorrhage/siderosis of a right frontal gyrus.  These correspond to the abnormality seen on the CT scan from 06/01/2023. Normal enhancement pattern.  INTERPRETING PHYSICIAN: Richard A. Epimenio Foot, MD, PhD, FAAN Certified in  Neuroimaging by [employer]   MR CERVICAL SPINE W WO CONTRAST  Result Date: 06/26/2023  [facility] [address] [place], [place] [address] [phone] NEUROIMAGING REPORT STUDY DATE: 06/25/2023 PATIENT NAME: Aliona Sluiter DOB: March 16, 1933 MRN:  [id number] EXAM: MRI of the cervical spine with and without contrast ORDERING CLINICIAN: Porfirio Mylar Dohmeier MD CLINICAL HISTORY: 89 year old woman with gait disturbance and history of lung cancer COMPARISON FILMS: CT 06/01/2023 TECHNIQUE: MRI of the cervical spine was obtained utilizing 3 mm sagittal slices from the posterior fossa down to the T3-4 level with T1, T2 and inversion recovery views. In addition 4 mm axial slices from C2-3 down to T1-2 level were included with T2 and gradient echo views.  After the infusion of contrast, additional T1-weighted images were performed. CONTRAST: 6.5 mL [employer] IMAGING SITE: [facility], [address] [place], [place], [place] FINDINGS: :  On sagittal images, the spine is imaged from above the cervicomedullary junction to T2.  T2 hyperintense foci are noted in the pons most consistent with chronic microvascular ischemic change.  Vertebrobasilar dolichoectasia is noted.  The cervicomedullary junction appears normal.  The spinal cord is of normal caliber and signal.   There is straightening of the upper cervical spine.  No spondylolisthesis is noted.  Disc height is reduced at C4-C5 and C5-C6.  The vertebral bodies have normal signal before and after contrast.  The discs and interspaces were  further evaluated on axial views from C2 to T2 as follows: C2-C3: There is mild left facet hypertrophy.  The level is otherwise normal and there is no spinal stenosis or nerve root compression. C3-C4: There is disc bulging, uncovertebral spurring and facet hypertrophy causing mild spinal stenosis, mild to moderate right and mild left foraminal narrowing.  There is no nerve root compression. C4-C5: There are bilateral disc osteophyte complexes, ligamenta flava hypertrophy and mild facet hypertrophy combining to cause mild spinal stenosis, moderately severe left foraminal narrowing and moderate right foraminal narrowing.  There is some potential for left C5 nerve root compression. C5-C6: There are  right greater than left disc osteophyte complexes causing mild spinal stenosis, moderate right foraminal narrowing and mild left foraminal narrowing.  There does not appear to be nerve root compression. C6-C7: There are disc osteophyte complexes bilaterally.  This causes moderate foraminal narrowing but no spinal stenosis or nerve root compression. C7-T1: This level appears normal. T1-T2: This level appears normal.  Incidental note of a perineural cyst on the right. There is a normal enhancement pattern.   This MRI of the cervical spine with and without contrast shows the following: The spinal cord appears normal. T2 hyperintense foci within the pons consistent with chronic microvascular ischemic change. At C3-C4, there are degenerative changes causing mild spinal stenosis and right greater than left foraminal narrowing but no nerve root compression. At C4-C5, there are degenerative changes causing mild spinal stenosis and moderately severe left foraminal narrowing and moderate right foraminal narrowing.  There is some potential for left C5 nerve root compression. At C5-C6, there are degenerative changes causing mild spinal stenosis and moderate foraminal narrowing but no nerve root compression. Milder degenerative changes at C2-C3 and C6-C7 do not lead to spinal stenosis or nerve root compression. Normal enhancement pattern. INTERPRETING PHYSICIAN: Richard A. Sater, MD, PhD, FAAN Certified in  Neuroimaging by [employer]       Current Outpatient Medications (Respiratory):    Budeson-Glycopyrrol-Formoterol (BREZTRI AEROSPHERE) 160-9-4.8 MCG/ACT AERO, Inhale 1 puff into the lungs in the morning and at bedtime.   montelukast (SINGULAIR) 10 MG tablet, Take  1 tablet  Daily for Allergies                                                            /                            TAKE                      BY                     MOUTH   cetirizine (ZYRTEC) 10 MG tablet, Takes 1 tablet Daily  (Patient not taking: Reported on 07/05/2023)  Current Outpatient Medications (Analgesics):    acetaminophen (TYLENOL) 325 MG tablet, Take 650 mg by mouth as needed for mild pain or moderate pain.   aspirin 81 MG chewable tablet, Chew 81 mg by mouth every evening.  Current Outpatient Medications (Hematological):    Cyanocobalamin (VITAMIN B-12) 1000 MCG SUBL, Takes 1 tab SL Daily  Current Outpatient Medications (Other):    buPROPion (WELLBUTRIN) 75 MG tablet, One tablet once a day  Cholecalciferol 125 MCG (5000 UT) capsule, Take 1 capsules Daily   diphenhydramine-acetaminophen (TYLENOL PM) 25-500 MG TABS tablet, Take 1 tablet by mouth at bedtime as needed (prn).  Past Medical History:  Diagnosis Date   COPD (chronic obstructive pulmonary disease) with emphysema (HCC)    COVID-19 12/09/2019   Hyperlipidemia    Hypertension    Lung cancer, upper lobe (HCC) 12/04/1996   RUL   Osteoporosis    S/P partial lobectomy of lung    [id number]   Thigh shingles 12/04/1996   Thoracic aorta atherosclerosis (HCC)    Vitamin D deficiency      Allergies  Allergen Reactions   Ace Inhibitors Cough   Calcium-Containing Compounds Other (See Comments)    constpation   Lactose Intolerance (Gi) Diarrhea   Polysporin [Bacitracin-Polymyxin B] Rash    Polysporin or neosporin gave her a rash, couldn't remember which    ROS: all negative except above.   Physical Exam: Filed Weights   07/05/23 1519  Weight: 132 lb 12.8 oz (60.2 kg)   BP 128/82   Pulse 82   Temp 98.1 F (36.7 C)   Ht 5\' 2"  (1.575 m)   Wt 132 lb 12.8 oz (60.2 kg)   SpO2 94%   BMI 24.29 kg/m  General Appearance: Thin frail pleasant female Eyes: PERRLA, EOMs, conjunctiva no swelling or erythema Sinuses: No Frontal/maxillary tenderness ENT/Mouth: Ext aud canals clear, TMs impacted with cerumen bilaterally. No erythema, swelling, or exudate on post pharynx.  Hard of hearing Neck: Supple, thyroid normal.  Respiratory: Respiratory  effort normal, BS equal bilaterally without rales, rhonchi, wheezing or stridor.  Cardio: RRR with no MRGs. Brisk peripheral pulses without edema.  Abdomen: Soft, + BS.  Non tender, no guarding, rebound, hernias, masses. Lymphatics: Non tender without lymphadenopathy.  Musculoskeletal: Decreased ROM left arm- in sling. Strength 4/5 of all extremities- using wheelchair for ambulation Skin: Warm, dry without rashes, lesions, ecchymosis.  Neuro: Cranial nerves intact. Normal muscle tone, no cerebellar symptoms. Sensation intact.  Psych: Awake and oriented X 2, normal affect, Insight and Judgment appropriate.     Raynelle Dick, NP 3:50 PM [facility]

## 2023-07-04 NOTE — Telephone Encounter (Signed)
Called the daughter and reviewed the results from the MRI brain and cervical spine results. Reviewed both in detail and answered questions that she had. She is planning to have the NCV/EMG completed next week. Pt's daughter verbalized understanding. She had no further questions at this time

## 2023-07-04 NOTE — Telephone Encounter (Signed)
-----   Message from [place] Dohmeier sent at 06/27/2023  8:41 AM EDT ----- More severe microvascular changes in the brain  and advanced brain atrophy with micro bleeds.  This is a good explanation for movement slowing, balance  insecurity, and can correlate with cognitive changes as well.

## 2023-07-05 ENCOUNTER — Ambulatory Visit (INDEPENDENT_AMBULATORY_CARE_PROVIDER_SITE_OTHER): Payer: Medicare Other | Admitting: Nurse Practitioner

## 2023-07-05 ENCOUNTER — Encounter: Payer: Self-pay | Admitting: Nurse Practitioner

## 2023-07-05 VITALS — BP 128/82 | HR 82 | Temp 98.1°F | Ht 62.0 in | Wt 132.8 lb

## 2023-07-05 DIAGNOSIS — Z79899 Other long term (current) drug therapy: Secondary | ICD-10-CM | POA: Diagnosis not present

## 2023-07-05 DIAGNOSIS — H6120 Impacted cerumen, unspecified ear: Secondary | ICD-10-CM

## 2023-07-05 DIAGNOSIS — I10 Essential (primary) hypertension: Secondary | ICD-10-CM | POA: Diagnosis not present

## 2023-07-05 DIAGNOSIS — S06310D Contusion and laceration of right cerebrum without loss of consciousness, subsequent encounter: Secondary | ICD-10-CM | POA: Diagnosis not present

## 2023-07-05 DIAGNOSIS — S42302D Unspecified fracture of shaft of humerus, left arm, subsequent encounter for fracture with routine healing: Secondary | ICD-10-CM | POA: Diagnosis not present

## 2023-07-05 DIAGNOSIS — E876 Hypokalemia: Secondary | ICD-10-CM

## 2023-07-05 DIAGNOSIS — J439 Emphysema, unspecified: Secondary | ICD-10-CM

## 2023-07-05 DIAGNOSIS — I629 Nontraumatic intracranial hemorrhage, unspecified: Secondary | ICD-10-CM

## 2023-07-05 DIAGNOSIS — H6123 Impacted cerumen, bilateral: Secondary | ICD-10-CM

## 2023-07-05 DIAGNOSIS — F321 Major depressive disorder, single episode, moderate: Secondary | ICD-10-CM

## 2023-07-05 NOTE — Patient Instructions (Signed)
GENERAL HEALTH GOALS   Know what a healthy weight is for you (roughly BMI <25) and aim to maintain this   Aim for 7+ servings of fruits and vegetables daily   70-80+ fluid ounces of water or unsweet tea for healthy kidneys   Limit to max 1 drink of alcohol per day; avoid smoking/tobacco   Limit animal fats in diet for cholesterol and heart health - choose grass fed whenever available   Increase lean proteins- boost or ensure daily  Avoid highly processed foods, and foods high in saturated/trans fats   Aim for low stress - take time to unwind and care for your mental health   Aim for 150 min of moderate intensity exercise weekly for heart health, and weights twice weekly for bone health   Aim for 7-9 hours of sleep daily

## 2023-07-06 DIAGNOSIS — S42402A Unspecified fracture of lower end of left humerus, initial encounter for closed fracture: Secondary | ICD-10-CM | POA: Diagnosis not present

## 2023-07-06 DIAGNOSIS — M79602 Pain in left arm: Secondary | ICD-10-CM | POA: Diagnosis not present

## 2023-07-09 DIAGNOSIS — S42302D Unspecified fracture of shaft of humerus, left arm, subsequent encounter for fracture with routine healing: Secondary | ICD-10-CM | POA: Diagnosis not present

## 2023-07-11 ENCOUNTER — Ambulatory Visit (INDEPENDENT_AMBULATORY_CARE_PROVIDER_SITE_OTHER): Payer: Medicare Other | Admitting: Neurology

## 2023-07-11 ENCOUNTER — Ambulatory Visit: Payer: Self-pay | Admitting: Neurology

## 2023-07-11 ENCOUNTER — Telehealth: Payer: Self-pay | Admitting: Neurology

## 2023-07-11 VITALS — Ht 62.0 in | Wt 139.0 lb

## 2023-07-11 DIAGNOSIS — R2689 Other abnormalities of gait and mobility: Secondary | ICD-10-CM

## 2023-07-11 DIAGNOSIS — R29898 Other symptoms and signs involving the musculoskeletal system: Secondary | ICD-10-CM | POA: Diagnosis not present

## 2023-07-11 DIAGNOSIS — R269 Unspecified abnormalities of gait and mobility: Secondary | ICD-10-CM

## 2023-07-11 DIAGNOSIS — Z85118 Personal history of other malignant neoplasm of bronchus and lung: Secondary | ICD-10-CM

## 2023-07-11 DIAGNOSIS — S0990XA Unspecified injury of head, initial encounter: Secondary | ICD-10-CM

## 2023-07-11 DIAGNOSIS — C3410 Malignant neoplasm of upper lobe, unspecified bronchus or lung: Secondary | ICD-10-CM

## 2023-07-11 DIAGNOSIS — H6123 Impacted cerumen, bilateral: Secondary | ICD-10-CM | POA: Diagnosis not present

## 2023-07-11 DIAGNOSIS — Z0289 Encounter for other administrative examinations: Secondary | ICD-10-CM

## 2023-07-11 NOTE — Progress Notes (Signed)
ASSESSMENT AND PLAN  Penny [name] is a 89 y.o. female   Sudden worsening gait abnormality since she fell in December 2023,  Hyperreflexia on examination, bilateral Babinski signs, also complains of worsening urinary urgency frequency,  MRI of the brain to show moderate atrophy, extensive periventricular Dorey vessel disease,  MRI of thoracic spine to rule out thoracic myelopathy  DIAGNOSTIC DATA (LABS, IMAGING, TESTING) - I reviewed patient records, labs, notes, testing and imaging myself where available.   MEDICAL HISTORY:  Penny [name], is a 89 year old female, accompanied by her daughter seen in request Dr. Vickey Huger for electrodiagnostic evaluation for gait abnormality, her primary care physician is Dr. Lucky Cowboy, MD   I reviewed and summarized the referring note. PMHx COPD Right upper lung cancer, s/p lobectomy in 1990s. 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, 1 day at work, she bent over to pick up a flower farm basket, no warning signs loss of consciousness, fell on her right side, was treated at the emergency room  She bruised her arm, but denies significant low back pain  Personally reviewed CT head no acute abnormality, CT cervical spine multilevel degenerative changes no fracture  Driver since then, she had progressive gait abnormality, began to rely on her walker, fell multiple times in June with a left humeral fracture, with increased gait abnormality, has to rely on wheelchair more  In addition, she noticed worsening urinary urgency, frequency,  She denies upper or lower extremity sensory changes, denies significant neck pain, low back pain  In October 2023, she was diagnosed with symptomatic sinus bradycardia, presenting with presyncope spells, heart rate was found to be in 20s,  metoprolol was stopped,  Seen by Dr. Vickey Huger on May 21, 2023, ordered MRIs, personally reviewed MRI of the brain June 26, 2023, no acute abnormality, mild to moderate atrophy, extensive periventricular Pha vessel disease, chronic microhemorrhage, noted in the cortex with a Terrio chronic subarachnoid hemorrhage/siderosis at the right frontal gyri  MRI of cervical spine, multilevel degenerative changes, but there was no significant canal stenosis, variable degree of foraminal narrowing, most noticeable at left C4-5,  Laboratory evaluation: Normal CPK negative troponin, CBC, CMP, with exception of mild elevated alkaline phosphate 232, normal iron panel, TSH, elevation of C-reactive protein 38.8 in March, ESR of 36,  PHYSICAL EXAM:   128/82, heart rate of 82   There is no height or weight on file to calculate BMI.  PHYSICAL EXAMNIATION:  Gen: NAD, conversant, well nourised, well groomed                     Cardiovascular: Regular rate rhythm, no peripheral edema, warm, nontender. Eyes: Conjunctivae clear without exudates or hemorrhage Neck: Supple, no carotid bruits. Pulmonary: Clear to auscultation bilaterally   NEUROLOGICAL EXAM:  MENTAL STATUS: Speech/cognition: Awake, alert, oriented to history taking and casual conversation CRANIAL NERVES: CN II: Visual fields are full to confrontation. Pupils are round equal and briskly reactive to light. CN III, IV, VI: extraocular movement are normal. No ptosis. CN V: Facial sensation is intact to light touch CN VII: Face is symmetric with normal eye closure  CN VIII: Hearing is normal to causal conversation. CN IX, X: Phonation is normal. CN XI: Head turning and shoulder shrug are intact  MOTOR: Left arm in cast, mild right finger  abduction weakness, no significant bilateral lower extremity proximal distal weakness, no significant rigidity bradykinesia  REFLEXES: Reflexes are 2+ and symmetric at the biceps, triceps, 3/3 knees, and  trace ankles. Plantar responses are extensor bilaterally  SENSORY: Length-dependent decrease of vibratory sensation, pinprick to below knee level  COORDINATION: There is no trunk or limb dysmetria noted.  GAIT/STANCE: Need help to get up from sitting position, stiff, cautious, unsteady tends to lean backwards  REVIEW OF SYSTEMS:  Full 14 system review of systems performed and notable only for as above All other review of systems were negative.   ALLERGIES: Allergies  Allergen Reactions   Ace Inhibitors Cough   Calcium-Containing Compounds Other (See Comments)    constpation   Lactose Intolerance (Gi) Diarrhea   Polysporin [Bacitracin-Polymyxin B] Rash    Polysporin or neosporin gave her a rash, couldn't remember which    HOME MEDICATIONS: Current Outpatient Medications  Medication Sig Dispense Refill   acetaminophen (TYLENOL) 325 MG tablet Take 650 mg by mouth as needed for mild pain or moderate pain.     aspirin 81 MG chewable tablet Chew 81 mg by mouth every evening.     Budeson-Glycopyrrol-Formoterol (BREZTRI AEROSPHERE) 160-9-4.8 MCG/ACT AERO Inhale 1 puff into the lungs in the morning and at bedtime. 10.7 g 3   buPROPion (WELLBUTRIN) 75 MG tablet One tablet once a day 90 tablet 0   cetirizine (ZYRTEC) 10 MG tablet Takes 1 tablet Daily (Patient not taking: Reported on 07/05/2023)     Cholecalciferol 125 MCG (5000 UT) capsule Take 1 capsules Daily 90 capsule 99   Cyanocobalamin (VITAMIN B-12) 1000 MCG SUBL Takes 1 tab SL Daily  0   diphenhydramine-acetaminophen (TYLENOL PM) 25-500 MG TABS tablet Take 1 tablet by mouth at bedtime as needed (prn).     montelukast (SINGULAIR) 10 MG tablet Take  1 tablet  Daily for Allergies                                                            /                            TAKE                      BY                     MOUTH 90 tablet 3   No current facility-administered medications for this visit.    PAST MEDICAL HISTORY: Past Medical  History:  Diagnosis Date   COPD (chronic obstructive pulmonary disease) with emphysema (HCC)    COVID-19 12/09/2019   Hyperlipidemia    Hypertension    Lung cancer, upper lobe (HCC) 12/04/1996   RUL   Osteoporosis    S/P partial lobectomy of lung    [id number]   Thigh shingles 12/04/1996   Thoracic aorta atherosclerosis (HCC)    Vitamin D deficiency     PAST SURGICAL HISTORY: Past Surgical History:  Procedure Laterality Date   CARPAL TUNNEL RELEASE Right    2009   LUNG REMOVAL, PARTIAL Right    RUL 1998   OLECRANON BURSECTOMY Left 03/20/2017   Procedure: EXICISION OLECRANON BURSA LEFT ELBOW;  Surgeon: Cindee Salt, MD;  Location:  [facility];  Service: Orthopedics;  Laterality: Left;  with scb block in preop   SHOULDER ADHESION RELEASE Right    2004    FAMILY HISTORY: Family History  Problem Relation Age of Onset   Heart disease Mother    Lung cancer Father    Breast cancer Paternal Aunt     SOCIAL HISTORY: Social History   Socioeconomic History   Marital status: Widowed    Spouse name: Not on file   Number of children: Not on file   Years of education: Not on file   Highest education level: Not on file  Occupational History   Not on file  Tobacco Use   Smoking status: Former    Current packs/day: 0.00    Types: Cigarettes    Quit date: 12/04/1996    Years since quitting: 26.6   Smokeless tobacco: Never  Vaping Use   Vaping status: Never Used  Substance and Sexual Activity   Alcohol use: Yes    Alcohol/week: 1.0 standard drink of alcohol    Types: 1 Glasses of wine per week    Comment: social   Drug use: No   Sexual activity: Not on file  Other Topics Concern   Not on file  Social History Narrative   Not on file   Social Determinants of Health   Financial Resource Strain: Not on file  Food Insecurity: No Food Insecurity (06/01/2023)   Hunger Vital Sign    Worried About Running Out of Food in the Last Year: Never true    Ran Out of Food in  the Last Year: Never true  Transportation Needs: No Transportation Needs (06/01/2023)   PRAPARE - Administrator, Civil Service (Medical): No    Lack of Transportation (Non-Medical): No  Physical Activity: Not on file  Stress: Not on file  Social Connections: Not on file  Intimate Partner Violence: Not At Risk (06/01/2023)   Humiliation, Afraid, Rape, and Kick questionnaire    Fear of Current or Ex-Partner: No    Emotionally Abused: No    Physically Abused: No    Sexually Abused: No      Levert Feinstein, M.D. Ph.D.  [facility] [address], [address] [place], [place] [address] Ph: [phone] Fax: [phone]  CC:  Lucky Cowboy, MD [address] [place],  [place] [address]  Lucky Cowboy, MD

## 2023-07-11 NOTE — Telephone Encounter (Signed)
[employer] medicare NPR sent to GI [phone] 

## 2023-07-11 NOTE — Procedures (Signed)
Full Name: Maurielle Parola Gender: Female MRN #: [id number] Date of Birth: 11-20-1933    Visit Date: 07/11/2023 14:42 Age: 89 Years Examining Physician: Dr. Levert Feinstein Referring Physician: Dr. Porfirio Mylar Dohmeier Height: 5 feet 2 inch History: 89 year old female with sudden worsening gait abnormality since she fell in December 2023.  Summary of the test: Nerve conduction study: Bilateral sural, superficial peroneal sensory responses were absent. Right ulnar and median sensory response showed normal snap amplitude, with mild to moderately prolonged peak latency.  Right radial sensory response was normal.  Bilateral tibial and left peroneal to EDB motor responses were absent. Right ulnar motor response showed mildly decreased CMAP amplitude, with normal right ulnar F wave latency.  Right median motor responses showed moderately decreased CMAP amplitude  Electromyography: Selected needle examinations were performed at the right upper, lower extremity muscles.  There was evidence of chronic neuropathic changes involving distal right leg muscles. There was no significant abnormalities at right upper extremity muscle.   Conclusion: This is an abnormal study.  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  But the degree of abnormality would not explain her profound gait abnormality hyperreflexia bilateral Babinski sign.    ------------------------------- Levert Feinstein, M.D. PhD  [facility] [address] [place], [place] [address] Tel: [phone] Fax: [phone]  Verbal informed consent was obtained from the patient, patient was informed of potential risk of procedure, including bruising, bleeding, hematoma formation, infection, muscle weakness, muscle pain, numbness, among others.        MNC    Nerve / Sites Muscle Latency Ref. Amplitude Ref. Rel Amp Segments Distance Velocity Ref. Area    ms  ms mV mV %  cm m/s m/s mVms  R Median - APB     Wrist APB 4.2 ?4.4 2.1 ?4.0 100 Wrist - APB 7   6.3     Upper arm APB 11.2  1.9  94 Upper arm - Wrist 24 34 ?49 7.6  R Ulnar - ADM     Wrist ADM 3.2 ?3.3 5.9 ?6.0 100 Wrist - ADM 7   19.6     B.Elbow ADM 5.4  5.1  87.8 B.Elbow - Wrist 11 51 ?49 18.1     A.Elbow ADM 8.0  4.6  88.9 A.Elbow - B.Elbow 13 50 ?49 17.6  L Peroneal - EDB     Ankle EDB NR ?6.5 NR ?2.0 NR Ankle - EDB 9   NR         Pop fossa - Ankle      L Tibial - AH     Ankle AH NR ?5.8 NR ?4.0 NR Ankle - AH 9   NR  R Tibial - AH     Ankle AH NR ?5.8 NR ?4.0 NR Ankle - AH 9   NR               SNC    Nerve / Sites Rec. Site Peak Lat Ref.  Amp Ref. Segments Distance    ms ms V V  cm  R Radial - Anatomical snuff box (Forearm)     Forearm Wrist 2.5 ?2.9 24 ?15 Forearm - Wrist 10  L Sural - Ankle (Calf)     Calf Ankle NR ?4.4 NR ?6 Calf - Ankle 14  R Sural - Ankle (Calf)     Calf Ankle NR ?4.4 NR ?6 Calf - Ankle 14  L Superficial peroneal - Ankle  Lat leg Ankle NR ?4.4 NR ?6 Lat leg - Ankle 14  R Superficial peroneal - Ankle     Lat leg Ankle NR ?4.4 NR ?6 Lat leg - Ankle 14  R Median - Orthodromic (Dig II, Mid palm)     Dig II Wrist 4.5 ?3.4 10 ?10 Dig II - Wrist 13  R Ulnar - Orthodromic, (Dig V, Mid palm)     Dig V Wrist 3.5 ?3.1 10 ?5 Dig V - Wrist 60                   F  Wave    Nerve F Lat Ref.   ms ms  R Ulnar - ADM 28.0 ?32.0       EMG Summary Table    Spontaneous MUAP Recruitment  Muscle IA Fib PSW Fasc Other Amp Dur. Poly Pattern  R. First dorsal interosseous Normal None None None _______ Normal Normal Normal Normal  R. Biceps brachii Normal None None None _______ Normal Normal Normal Normal  R. Deltoid Normal None None None _______ Normal Normal Normal Normal  R. Triceps brachii Normal None None None _______ Normal Normal Normal Normal  R. Tibialis anterior Normal None None None _______ Increased Increased 1+ Reduced  R. Tibialis posterior Normal None  None None _______ Increased Increased 1+ Reduced  R. Peroneus longus Normal None None None _______ Increased Increased 1+ Reduced  R. Gastrocnemius (Medial head) Normal None None None _______ Increased Increased 1+ Reduced  R. Vastus lateralis Normal None None None _______ Normal Normal Normal Reduced

## 2023-07-13 DIAGNOSIS — I10 Essential (primary) hypertension: Secondary | ICD-10-CM | POA: Diagnosis not present

## 2023-07-13 DIAGNOSIS — J449 Chronic obstructive pulmonary disease, unspecified: Secondary | ICD-10-CM | POA: Diagnosis not present

## 2023-07-13 DIAGNOSIS — S06360D Traumatic hemorrhage of cerebrum, unspecified, without loss of consciousness, subsequent encounter: Secondary | ICD-10-CM | POA: Diagnosis not present

## 2023-07-13 DIAGNOSIS — M80022D Age-related osteoporosis with current pathological fracture, left humerus, subsequent encounter for fracture with routine healing: Secondary | ICD-10-CM | POA: Diagnosis not present

## 2023-07-13 DIAGNOSIS — Z9181 History of falling: Secondary | ICD-10-CM | POA: Diagnosis not present

## 2023-07-16 DIAGNOSIS — M80022D Age-related osteoporosis with current pathological fracture, left humerus, subsequent encounter for fracture with routine healing: Secondary | ICD-10-CM | POA: Diagnosis not present

## 2023-07-16 DIAGNOSIS — I10 Essential (primary) hypertension: Secondary | ICD-10-CM | POA: Diagnosis not present

## 2023-07-16 DIAGNOSIS — J449 Chronic obstructive pulmonary disease, unspecified: Secondary | ICD-10-CM | POA: Diagnosis not present

## 2023-07-16 DIAGNOSIS — Z9181 History of falling: Secondary | ICD-10-CM | POA: Diagnosis not present

## 2023-07-16 DIAGNOSIS — S06360D Traumatic hemorrhage of cerebrum, unspecified, without loss of consciousness, subsequent encounter: Secondary | ICD-10-CM | POA: Diagnosis not present

## 2023-07-16 NOTE — Progress Notes (Signed)
Please call and inform patient that NCS were abnormal and showed evidence of axonal neuropathy. We are ordering a MRI thoracic spine for additional evaluation.

## 2023-07-19 ENCOUNTER — Telehealth: Payer: Self-pay | Admitting: Neurology

## 2023-07-19 DIAGNOSIS — M80022D Age-related osteoporosis with current pathological fracture, left humerus, subsequent encounter for fracture with routine healing: Secondary | ICD-10-CM | POA: Diagnosis not present

## 2023-07-19 DIAGNOSIS — J449 Chronic obstructive pulmonary disease, unspecified: Secondary | ICD-10-CM | POA: Diagnosis not present

## 2023-07-19 DIAGNOSIS — I10 Essential (primary) hypertension: Secondary | ICD-10-CM | POA: Diagnosis not present

## 2023-07-19 DIAGNOSIS — Z9181 History of falling: Secondary | ICD-10-CM | POA: Diagnosis not present

## 2023-07-19 DIAGNOSIS — S06360D Traumatic hemorrhage of cerebrum, unspecified, without loss of consciousness, subsequent encounter: Secondary | ICD-10-CM | POA: Diagnosis not present

## 2023-07-19 NOTE — Telephone Encounter (Signed)
Called the patient and daughter phone was the number as first contact. LVM per DPR.   If daughter calls back please advise Dr Teresa Coombs reviewed the result for Dr Vickey Huger while she is on vacation. The nerve conduction study that was completed by Dr Terrace Arabia was abnormal and showed evidence of axonal neuropathy. Dr Terrace Arabia ordered a MRI of thoracic spine for additional evaluation.  I see she is scheduled for that on 08/14/2023. Once that is complete, we will notify of those results as well.

## 2023-07-19 NOTE — Telephone Encounter (Signed)
-----   Message from [facility] sent at 07/16/2023 12:21 PM EDT ----- Please call and inform patient that NCS were abnormal and showed evidence of axonal neuropathy. We are ordering a MRI thoracic spine for additional evaluation.

## 2023-07-23 ENCOUNTER — Telehealth: Payer: Self-pay | Admitting: Nurse Practitioner

## 2023-07-23 NOTE — Telephone Encounter (Signed)
Woman from [facility] called to let us know pt's occupational therapy is being done next week, instead of this week, b/c pt is out of town.

## 2023-07-30 DIAGNOSIS — J449 Chronic obstructive pulmonary disease, unspecified: Secondary | ICD-10-CM | POA: Diagnosis not present

## 2023-07-30 DIAGNOSIS — Z9181 History of falling: Secondary | ICD-10-CM | POA: Diagnosis not present

## 2023-07-30 DIAGNOSIS — S06360D Traumatic hemorrhage of cerebrum, unspecified, without loss of consciousness, subsequent encounter: Secondary | ICD-10-CM | POA: Diagnosis not present

## 2023-07-30 DIAGNOSIS — M80022D Age-related osteoporosis with current pathological fracture, left humerus, subsequent encounter for fracture with routine healing: Secondary | ICD-10-CM | POA: Diagnosis not present

## 2023-07-30 DIAGNOSIS — I10 Essential (primary) hypertension: Secondary | ICD-10-CM | POA: Diagnosis not present

## 2023-08-06 ENCOUNTER — Emergency Department (HOSPITAL_COMMUNITY): Payer: Medicare Other

## 2023-08-06 ENCOUNTER — Other Ambulatory Visit: Payer: Self-pay

## 2023-08-06 ENCOUNTER — Inpatient Hospital Stay (HOSPITAL_COMMUNITY)
Admission: EM | Admit: 2023-08-06 | Discharge: 2023-08-10 | DRG: 064 | Disposition: A | Discharge status: Skilled Nursing Facility | Payer: Medicare Other | Attending: Neurology | Admitting: Neurology

## 2023-08-06 ENCOUNTER — Encounter (HOSPITAL_COMMUNITY): Payer: Self-pay | Admitting: *Deleted

## 2023-08-06 ENCOUNTER — Inpatient Hospital Stay (HOSPITAL_COMMUNITY): Payer: Medicare Other

## 2023-08-06 DIAGNOSIS — G9389 Other specified disorders of brain: Secondary | ICD-10-CM | POA: Diagnosis not present

## 2023-08-06 DIAGNOSIS — E782 Mixed hyperlipidemia: Secondary | ICD-10-CM | POA: Diagnosis not present

## 2023-08-06 DIAGNOSIS — R9082 White matter disease, unspecified: Secondary | ICD-10-CM | POA: Diagnosis not present

## 2023-08-06 DIAGNOSIS — I69351 Hemiplegia and hemiparesis following cerebral infarction affecting right dominant side: Secondary | ICD-10-CM | POA: Diagnosis not present

## 2023-08-06 DIAGNOSIS — R58 Hemorrhage, not elsewhere classified: Secondary | ICD-10-CM | POA: Diagnosis not present

## 2023-08-06 DIAGNOSIS — I10 Essential (primary) hypertension: Secondary | ICD-10-CM | POA: Diagnosis present

## 2023-08-06 DIAGNOSIS — I2510 Atherosclerotic heart disease of native coronary artery without angina pectoris: Secondary | ICD-10-CM | POA: Diagnosis not present

## 2023-08-06 DIAGNOSIS — I700 Atherosclerosis of aorta: Secondary | ICD-10-CM | POA: Diagnosis not present

## 2023-08-06 DIAGNOSIS — E041 Nontoxic single thyroid nodule: Secondary | ICD-10-CM | POA: Diagnosis not present

## 2023-08-06 DIAGNOSIS — Z8616 Personal history of COVID-19: Secondary | ICD-10-CM | POA: Diagnosis not present

## 2023-08-06 DIAGNOSIS — R1310 Dysphagia, unspecified: Secondary | ICD-10-CM | POA: Diagnosis present

## 2023-08-06 DIAGNOSIS — Z9181 History of falling: Secondary | ICD-10-CM

## 2023-08-06 DIAGNOSIS — Z902 Acquired absence of lung [part of]: Secondary | ICD-10-CM

## 2023-08-06 DIAGNOSIS — Z803 Family history of malignant neoplasm of breast: Secondary | ICD-10-CM

## 2023-08-06 DIAGNOSIS — M6281 Muscle weakness (generalized): Secondary | ICD-10-CM | POA: Diagnosis not present

## 2023-08-06 DIAGNOSIS — E876 Hypokalemia: Secondary | ICD-10-CM | POA: Diagnosis not present

## 2023-08-06 DIAGNOSIS — Z79899 Other long term (current) drug therapy: Secondary | ICD-10-CM

## 2023-08-06 DIAGNOSIS — R29716 NIHSS score 16: Secondary | ICD-10-CM | POA: Diagnosis present

## 2023-08-06 DIAGNOSIS — Z87891 Personal history of nicotine dependence: Secondary | ICD-10-CM

## 2023-08-06 DIAGNOSIS — R4701 Aphasia: Secondary | ICD-10-CM | POA: Diagnosis present

## 2023-08-06 DIAGNOSIS — J439 Emphysema, unspecified: Secondary | ICD-10-CM | POA: Diagnosis present

## 2023-08-06 DIAGNOSIS — Z801 Family history of malignant neoplasm of trachea, bronchus and lung: Secondary | ICD-10-CM

## 2023-08-06 DIAGNOSIS — Z85118 Personal history of other malignant neoplasm of bronchus and lung: Secondary | ICD-10-CM

## 2023-08-06 DIAGNOSIS — Z8673 Personal history of transient ischemic attack (TIA), and cerebral infarction without residual deficits: Secondary | ICD-10-CM

## 2023-08-06 DIAGNOSIS — R410 Disorientation, unspecified: Secondary | ICD-10-CM | POA: Diagnosis not present

## 2023-08-06 DIAGNOSIS — Z888 Allergy status to other drugs, medicaments and biological substances status: Secondary | ICD-10-CM

## 2023-08-06 DIAGNOSIS — G8191 Hemiplegia, unspecified affecting right dominant side: Secondary | ICD-10-CM | POA: Diagnosis not present

## 2023-08-06 DIAGNOSIS — G936 Cerebral edema: Secondary | ICD-10-CM | POA: Diagnosis not present

## 2023-08-06 DIAGNOSIS — R531 Weakness: Secondary | ICD-10-CM | POA: Diagnosis not present

## 2023-08-06 DIAGNOSIS — R569 Unspecified convulsions: Secondary | ICD-10-CM | POA: Diagnosis present

## 2023-08-06 DIAGNOSIS — Z7951 Long term (current) use of inhaled steroids: Secondary | ICD-10-CM | POA: Diagnosis not present

## 2023-08-06 DIAGNOSIS — G629 Polyneuropathy, unspecified: Secondary | ICD-10-CM | POA: Diagnosis not present

## 2023-08-06 DIAGNOSIS — M810 Age-related osteoporosis without current pathological fracture: Secondary | ICD-10-CM | POA: Diagnosis present

## 2023-08-06 DIAGNOSIS — I6389 Other cerebral infarction: Secondary | ICD-10-CM | POA: Diagnosis not present

## 2023-08-06 DIAGNOSIS — R930 Abnormal findings on diagnostic imaging of skull and head, not elsewhere classified: Secondary | ICD-10-CM | POA: Diagnosis not present

## 2023-08-06 DIAGNOSIS — E785 Hyperlipidemia, unspecified: Secondary | ICD-10-CM | POA: Diagnosis present

## 2023-08-06 DIAGNOSIS — I672 Cerebral atherosclerosis: Secondary | ICD-10-CM | POA: Diagnosis not present

## 2023-08-06 DIAGNOSIS — I619 Nontraumatic intracerebral hemorrhage, unspecified: Secondary | ICD-10-CM | POA: Diagnosis not present

## 2023-08-06 DIAGNOSIS — R29818 Other symptoms and signs involving the nervous system: Secondary | ICD-10-CM | POA: Diagnosis not present

## 2023-08-06 DIAGNOSIS — I6782 Cerebral ischemia: Secondary | ICD-10-CM | POA: Diagnosis not present

## 2023-08-06 DIAGNOSIS — M47812 Spondylosis without myelopathy or radiculopathy, cervical region: Secondary | ICD-10-CM | POA: Diagnosis not present

## 2023-08-06 DIAGNOSIS — R0902 Hypoxemia: Secondary | ICD-10-CM | POA: Diagnosis not present

## 2023-08-06 DIAGNOSIS — S42302A Unspecified fracture of shaft of humerus, left arm, initial encounter for closed fracture: Secondary | ICD-10-CM | POA: Diagnosis not present

## 2023-08-06 DIAGNOSIS — I611 Nontraumatic intracerebral hemorrhage in hemisphere, cortical: Secondary | ICD-10-CM | POA: Diagnosis not present

## 2023-08-06 DIAGNOSIS — S2241XA Multiple fractures of ribs, right side, initial encounter for closed fracture: Secondary | ICD-10-CM | POA: Diagnosis not present

## 2023-08-06 DIAGNOSIS — Z7982 Long term (current) use of aspirin: Secondary | ICD-10-CM

## 2023-08-06 DIAGNOSIS — Z8249 Family history of ischemic heart disease and other diseases of the circulatory system: Secondary | ICD-10-CM

## 2023-08-06 DIAGNOSIS — R6889 Other general symptoms and signs: Secondary | ICD-10-CM | POA: Diagnosis not present

## 2023-08-06 DIAGNOSIS — Z743 Need for continuous supervision: Secondary | ICD-10-CM | POA: Diagnosis not present

## 2023-08-06 DIAGNOSIS — I6529 Occlusion and stenosis of unspecified carotid artery: Secondary | ICD-10-CM | POA: Diagnosis not present

## 2023-08-06 DIAGNOSIS — J449 Chronic obstructive pulmonary disease, unspecified: Secondary | ICD-10-CM | POA: Diagnosis not present

## 2023-08-06 DIAGNOSIS — G238 Other specified degenerative diseases of basal ganglia: Secondary | ICD-10-CM | POA: Diagnosis not present

## 2023-08-06 DIAGNOSIS — U071 COVID-19: Secondary | ICD-10-CM | POA: Diagnosis not present

## 2023-08-06 DIAGNOSIS — R2689 Other abnormalities of gait and mobility: Secondary | ICD-10-CM | POA: Diagnosis not present

## 2023-08-06 DIAGNOSIS — R9089 Other abnormal findings on diagnostic imaging of central nervous system: Secondary | ICD-10-CM | POA: Diagnosis not present

## 2023-08-06 DIAGNOSIS — Z7401 Bed confinement status: Secondary | ICD-10-CM | POA: Diagnosis not present

## 2023-08-06 LAB — URINALYSIS, ROUTINE W REFLEX MICROSCOPIC
Bilirubin Urine: NEGATIVE
Glucose, UA: 150 mg/dL — AB
Hgb urine dipstick: NEGATIVE
Ketones, ur: 20 mg/dL — AB
Leukocytes,Ua: NEGATIVE
Nitrite: NEGATIVE
Protein, ur: 100 mg/dL — AB
Specific Gravity, Urine: 1.016 (ref 1.005–1.030)
pH: 6 (ref 5.0–8.0)

## 2023-08-06 LAB — RAPID URINE DRUG SCREEN, HOSP PERFORMED
Amphetamines: NOT DETECTED
Barbiturates: NOT DETECTED
Benzodiazepines: NOT DETECTED
Cocaine: NOT DETECTED
Opiates: POSITIVE — AB
Tetrahydrocannabinol: NOT DETECTED

## 2023-08-06 LAB — CBC WITH DIFFERENTIAL/PLATELET
Abs Immature Granulocytes: 0.03 10*3/uL (ref 0.00–0.07)
Basophils Absolute: 0 10*3/uL (ref 0.0–0.1)
Basophils Relative: 0 %
Eosinophils Absolute: 0 10*3/uL (ref 0.0–0.5)
Eosinophils Relative: 0 %
HCT: 40.4 % (ref 36.0–46.0)
Hemoglobin: 12.9 g/dL (ref 12.0–15.0)
Immature Granulocytes: 0 %
Lymphocytes Relative: 8 %
Lymphs Abs: 0.7 10*3/uL (ref 0.7–4.0)
MCH: 27.6 pg (ref 26.0–34.0)
MCHC: 31.9 g/dL (ref 30.0–36.0)
MCV: 86.3 fL (ref 80.0–100.0)
Monocytes Absolute: 0.8 10*3/uL (ref 0.1–1.0)
Monocytes Relative: 9 %
Neutro Abs: 7.2 10*3/uL (ref 1.7–7.7)
Neutrophils Relative %: 83 %
Platelets: 165 10*3/uL (ref 150–400)
RBC: 4.68 MIL/uL (ref 3.87–5.11)
RDW: 14.6 % (ref 11.5–15.5)
WBC: 8.7 10*3/uL (ref 4.0–10.5)
nRBC: 0 % (ref 0.0–0.2)

## 2023-08-06 LAB — BLOOD GAS, VENOUS
Acid-Base Excess: 4.8 mmol/L — ABNORMAL HIGH (ref 0.0–2.0)
Bicarbonate: 29.9 mmol/L — ABNORMAL HIGH (ref 20.0–28.0)
O2 Saturation: 59.9 %
Patient temperature: 37
pCO2, Ven: 45 mmHg (ref 44–60)
pH, Ven: 7.43 (ref 7.25–7.43)
pO2, Ven: 32 mmHg (ref 32–45)

## 2023-08-06 LAB — HEPATIC FUNCTION PANEL
ALT: 16 U/L (ref 0–44)
AST: 19 U/L (ref 15–41)
Albumin: 3.7 g/dL (ref 3.5–5.0)
Alkaline Phosphatase: 234 U/L — ABNORMAL HIGH (ref 38–126)
Bilirubin, Direct: 0.1 mg/dL (ref 0.0–0.2)
Indirect Bilirubin: 0.7 mg/dL (ref 0.3–0.9)
Total Bilirubin: 0.8 mg/dL (ref 0.3–1.2)
Total Protein: 6.7 g/dL (ref 6.5–8.1)

## 2023-08-06 LAB — ETHANOL: Alcohol, Ethyl (B): <10 mg/dL (ref <10)

## 2023-08-06 LAB — BASIC METABOLIC PANEL
Anion gap: 11 (ref 5–15)
BUN: 15 mg/dL (ref 8–23)
CO2: 25 mmol/L (ref 22–32)
Calcium: 8.9 mg/dL (ref 8.9–10.3)
Chloride: 102 mmol/L (ref 98–111)
Creatinine, Ser: 0.67 mg/dL (ref 0.44–1.00)
GFR, Estimated: >60 mL/min (ref >60)
Glucose, Bld: 171 mg/dL — ABNORMAL HIGH (ref 70–99)
Potassium: 3.3 mmol/L — ABNORMAL LOW (ref 3.5–5.1)
Sodium: 138 mmol/L (ref 135–145)

## 2023-08-06 LAB — MRSA NEXT GEN BY PCR, NASAL: MRSA by PCR Next Gen: NOT DETECTED

## 2023-08-06 LAB — I-STAT CG4 LACTIC ACID, ED: Lactic Acid, Venous: 1.5 mmol/L (ref 0.5–1.9)

## 2023-08-06 LAB — AMMONIA: Ammonia: 19 umol/L (ref 9–35)

## 2023-08-06 LAB — PROTIME-INR
INR: 1 (ref 0.8–1.2)
Prothrombin Time: 13.3 s (ref 11.4–15.2)

## 2023-08-06 LAB — TSH: TSH: 0.748 u[IU]/mL (ref 0.350–4.500)

## 2023-08-06 LAB — CBG MONITORING, ED: Glucose-Capillary: 165 mg/dL — ABNORMAL HIGH (ref 70–99)

## 2023-08-06 MED ORDER — ACETAMINOPHEN 160 MG/5ML PO SOLN: 650.0000 mg | ORAL | Status: DC | PRN

## 2023-08-06 MED ORDER — POTASSIUM CHLORIDE 10 MEQ/100ML IV SOLN
10.0000 meq | INTRAVENOUS | Status: AC
  Administered 2023-08-06 (×4): 10 meq via INTRAVENOUS
  Filled 2023-08-06 (×4): qty 100

## 2023-08-06 MED ORDER — SENNOSIDES-DOCUSATE SODIUM 8.6-50 MG PO TABS
1.0000 | ORAL_TABLET | Freq: Two times a day (BID) | ORAL | Status: DC
  Administered 2023-08-07 – 2023-08-10 (×5): 1 via ORAL
  Filled 2023-08-06 (×5): qty 1

## 2023-08-06 MED ORDER — NICARDIPINE HCL IN NACL 20-0.86 MG/200ML-% IV SOLN
0.0000 mg/h | INTRAVENOUS | Status: DC
  Administered 2023-08-06: 2 mg/h via INTRAVENOUS
  Filled 2023-08-06: qty 200

## 2023-08-06 MED ORDER — STROKE: EARLY STAGES OF RECOVERY BOOK
Freq: Once | Status: AC
  Filled 2023-08-06: qty 1

## 2023-08-06 MED ORDER — ACETAMINOPHEN 325 MG PO TABS: 650.0000 mg | ORAL_TABLET | ORAL | Status: DC | PRN

## 2023-08-06 MED ORDER — PANTOPRAZOLE SODIUM 40 MG IV SOLR
40.0000 mg | Freq: Every day | INTRAVENOUS | Status: DC
  Administered 2023-08-07: 40 mg via INTRAVENOUS
  Filled 2023-08-06: qty 10

## 2023-08-06 MED ORDER — CLEVIDIPINE BUTYRATE 0.5 MG/ML IV EMUL: 0.0000 mg/h | INTRAVENOUS | Status: DC

## 2023-08-06 MED ORDER — GADOBUTROL 1 MMOL/ML IV SOLN
6.0000 mL | Freq: Once | INTRAVENOUS | Status: AC | PRN
  Administered 2023-08-06: 6 mL via INTRAVENOUS

## 2023-08-06 MED ORDER — LEVETIRACETAM IN NACL 500 MG/100ML IV SOLN
500.0000 mg | Freq: Two times a day (BID) | INTRAVENOUS | Status: DC
  Administered 2023-08-06 – 2023-08-10 (×8): 500 mg via INTRAVENOUS
  Filled 2023-08-06 (×8): qty 100

## 2023-08-06 MED ORDER — ACETAMINOPHEN 10 MG/ML IV SOLN
1000.0000 mg | Freq: Once | INTRAVENOUS | Status: AC
  Administered 2023-08-06: 1000 mg via INTRAVENOUS
  Filled 2023-08-06: qty 100

## 2023-08-06 MED ORDER — SODIUM CHLORIDE 0.9 % IV BOLUS
1000.0000 mL | Freq: Once | INTRAVENOUS | Status: AC
  Administered 2023-08-06: 1000 mL via INTRAVENOUS

## 2023-08-06 MED ORDER — LORAZEPAM 2 MG/ML IJ SOLN
1.0000 mg | Freq: Once | INTRAMUSCULAR | Status: AC
  Administered 2023-08-06: 1 mg via INTRAVENOUS
  Filled 2023-08-06: qty 1

## 2023-08-06 MED ORDER — CHLORHEXIDINE GLUCONATE CLOTH 2 % EX PADS
6.0000 | MEDICATED_PAD | Freq: Every day | CUTANEOUS | Status: DC
  Administered 2023-08-07 – 2023-08-10 (×4): 6 via TOPICAL

## 2023-08-06 MED ORDER — LABETALOL HCL 5 MG/ML IV SOLN: 20.0000 mg | Freq: Once | INTRAVENOUS | Status: DC

## 2023-08-06 MED ORDER — ACETAMINOPHEN 650 MG RE SUPP: 650.0000 mg | RECTAL | Status: DC | PRN

## 2023-08-06 MED ORDER — IOHEXOL 350 MG/ML SOLN
75.0000 mL | Freq: Once | INTRAVENOUS | Status: AC | PRN
  Administered 2023-08-06: 75 mL via INTRAVENOUS

## 2023-08-06 MED ORDER — SODIUM CHLORIDE 0.9 % IV SOLN
2000.0000 mg | Freq: Once | INTRAVENOUS | Status: AC
  Administered 2023-08-06: 2000 mg via INTRAVENOUS
  Filled 2023-08-06: qty 20

## 2023-08-06 NOTE — ED Notes (Addendum)
Pt taken to CT, resting/ sleeping, remains lethargic and confused. No sz activity thus far noted in ED. VSS. BP improved since moving cuff to leg. Family x2 at [facility].

## 2023-08-06 NOTE — H&P (Addendum)
Neurology H&P  CC: ICH  History is obtained from:Chart review, family  HPI: Penny [name] is a 89 y.o. female with a past medical history of COPD, hyperlipidemia, hypertension presenting to the emergency department with altered mental status. Patient had seizure like activity witnessed by family, EMS called and she went unresponsive. On EMS arrival patient was unresponsive, incontinent of urine. SpO2 87% and then placed on [place]. She became more alert en route to the hospital. Per family she has been confused for the last couple of days but she has also had a rapid decline in functioning since her fall in December 2023. Her son states that "her legs have not been working." She is not able to ambulate independently, she uses a wheelchair almost exclusively. Prior to December she was still working at [employer], driving, and independent.   She was treated for a fall on 6/28 and workup included a CT head that shows a punctate focus of acute IPH in the right frontal cortex as well as a left humerus fracture.  This fall was thought to be result of her syncopal episode and she followed up with outpatient cardiology and outpatient neurology. She was following with Dr. Vickey Huger with [facility] after her fall. 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  In her last neurology visit on 8/7 is documented that she was found to be alert and oriented, hyperreflexive with bilateral babinski sign, and reporting worsening urinary frequency.  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  At that time her metoprolol and hydrochlorothiazide had recently been changed due to previous episodes of syncope and bradycardia.  She does still follow closely with cardiology and has had her metoprolol permanently discontinued.  She is  still on aspirin for aortic atherosclerosis, hyperlipidemia, and coronary calcification.  She has not been on any medications for hypertension.    LKW: 2-3 days ago mRs: 4 ICH Score: 1  NIHSS:  1a Level of Conscious.: 0 1b LOC Questions: 2 1c LOC Commands: 2 2 Best Gaze: 0 3 Visual: 0 4 Facial Palsy: 0 5a Motor Arm - left: 1 5b Motor Arm - Right: 2  6a Motor Leg - Left: 0 6b Motor Leg - Right: 1 7 Limb Ataxia: 0 8 Sensory: 0 9 Best Language: 2 10 Dysarthria: 2 11 Extinct and Inattention.: 0 TOTAL: 12     ROS: Unable to obtain due to altered mental status.   Past Medical History:  Diagnosis Date   COPD (chronic obstructive pulmonary disease) with emphysema (HCC)    COVID-19 12/09/2019   Hyperlipidemia    Hypertension    Lung cancer, upper lobe (HCC) 12/04/1996   RUL   Osteoporosis    S/P partial lobectomy of lung    [id number]   Thigh shingles 12/04/1996   Thoracic aorta atherosclerosis (HCC)    Vitamin D deficiency      Family History  Problem Relation Age of Onset   Heart disease Mother    Lung cancer Father    Breast cancer Paternal Aunt      Social History:  reports that she quit smoking about 26 years ago. Her smoking use included cigarettes. She has never used smokeless tobacco. She reports current alcohol use of about 1.0 standard drink of alcohol per week. She  reports that she does not use drugs.   Prior to Admission medications   Medication Sig Start Date End Date Taking? Authorizing Provider  acetaminophen (TYLENOL) 325 MG tablet Take 650 mg by mouth as needed for mild pain or moderate pain.    [provider]  aspirin 81 MG chewable tablet Chew 81 mg by mouth every evening.    [provider]  Budeson-Glycopyrrol-Formoterol (BREZTRI AEROSPHERE) 160-9-4.8 MCG/ACT AERO Inhale 1 puff into the lungs in the morning and at bedtime. 03/21/23   Raynelle Dick, NP  buPROPion [facility]) 75 MG tablet One tablet once a day 05/14/23    Raynelle Dick, NP  cetirizine (ZYRTEC) 10 MG tablet Takes 1 tablet Daily Patient not taking: Reported on 07/05/2023 05/02/19   Lucky Cowboy, MD  Cholecalciferol 125 MCG (5000 UT) capsule Take 1 capsules Daily 12/01/21   Judd Gaudier, NP  Cyanocobalamin (VITAMIN B-12) 1000 MCG SUBL Takes 1 tab SL Daily 05/02/19   Lucky Cowboy, MD  diphenhydramine-acetaminophen (TYLENOL PM) 25-500 MG TABS tablet Take 1 tablet by mouth at bedtime as needed (prn).    [provider]  montelukast (SINGULAIR) 10 MG tablet Take  1 tablet  Daily for Allergies                                                            /                            TAKE                      BY                     MOUTH 05/08/23   Lucky Cowboy, MD     Exam: Current vital signs: BP (!) 154/89 (BP Location: Right Leg) Comment (BP Location): moved to leg d/t flexing reaction to BP cuff  Pulse (!) 107   Temp (!) 97.4 F (36.3 C) (Oral)   Resp (!) 25   Wt 63 kg   SpO2 94%   BMI 25.40 kg/m    Physical Exam  Constitutional: Elderly Caucasian female  Psych: Affect appropriate to situation Eyes: No scleral injection HENT: No OP obstrucion Head: Normocephalic.  Cardiovascular: Tachycardic, regular rhythm.  Respiratory: Effort normal and breath sounds normal to anterior ascultation GI: Soft.  No distension. There is no tenderness.  Skin: WDI  Neuro: Mental Status: Patient is awake, tracking bilaterally  Appears globally aphasic  Cranial Nerves: II: Blinks to threat bilaterally. Pupils are equal, round, and reactive to light.   III,IV, VI: EOMI without ptosis or diplopia.  Tracks bilaterally V: Facial sensation is symmetric to light touch VII: Face is symmetric resting and smiling.  VIII: Hearing is intact to voice X: Palate is midline and palate elevates symmetrically, phonation intact XI: Shoulder shrug is symmetric. XII: tongue is midline without atrophy or fasciculations.  Motor: Tone is normal. Bulk  is normal.  Right arm with drift, hand grasp is slightly weaker than the right Elevate left arm purposefully, no drift however she does have a cast on her left arm Increased movement noted on the left lower extremity in comparison to the right.  However she moves both  distally Sensory: Grimaces to pain in all extremities Cerebellar: Unable to complete FNF or HKS however no ataxia noted with movement   I have reviewed labs in epic and the pertinent results are: K-3.3 40 mEq KCl ordered  I have reviewed the images obtained: 08/06/2023 CT Head 1. Positive for Lenzen acute intra-axial hemorrhage in the Left temporal lobe, estimated 3-4 mL. Surrounding edema but no significant mass effect. 2. No extra-axial extension, IVH, or other complicating features. And no other acute intracranial abnormality. Advanced chronic white matter disease. Right hemisphere chronic microhemorrhages in the brain as detailed by MRI last month.  08/06/2023 CT Angio Head and Neck with Venogram 1. No emergent large vessel occlusion or proximal hemodynamically significant stenosis. 2. No evidence of vascular malformation or aneurysm in the region of left temporal hemorrhage, although acute blood products limits assessment. 3. No dural venous sinus thrombosis. 4. 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 This is not in the region of acute hemorrhage. 5. Aortic Atherosclerosis (ICD10-I70.0) and Emphysema (ICD10-J43.9).  06/26/2023 MRI brain with and without Mild to moderate generalized cortical atrophy and mild corpus callosum atrophy. Very extensive confluent T2/FLAIR hypertense signal in the hemispheres consistent with severe chronic microvascular ischemic change.  None of the foci appear to be acute. Chronic microhemorrhages noted in the cortex with a Acklin chronic subarachnoid hemorrhage/siderosis of a right frontal gyrus.  These correspond to the abnormality seen on the  CT scan from 06/01/2023. Normal enhancement pattern.    06/01/2023 CT Head 1. Continued evidence of 3 mm hypodense focus over the right frontal region which may be parenchymal versus subarachnoid in location. 2. Moderate chronic ischemic microvascular disease.  Assessment:  89 year old woman past history of COPD hyperlipidemia hypertension with multiple falls over the past few weeks to months brought in for evaluation of seizure-like activity.  CT head with a [place] area of ICH in the left temporal lobe with surrounding edema--makes you wonder if this was an ischemic infarct and then had hemorrhagic transformation versus a hemorrhagic stroke versus hemorrhage from amyloid angiopathy versus hypertensive hemorrhage.   Plan: Acuity: Acute Laterality: Left Current suspected etiology:   amyloid angiopathy vs hemorrhage infarct vs hypertensive Treatment: BP control -Admit to ICU -ICH Score: 1 -ICH Volume: 3-58ml  -MRI Brain W WO at 1900 -BP control goal SYS< 130-150  for first 24 hours, then <160 -PT/OT/ST  -neuromonitoring  CNS Cerebral edema-repeat imaging.  No need for hypertonic's for right now.  Seizure - Loaded with 2g Keppra - 500mg  Keppra BID -Seizure precautions -LTM in place -MRI Brain w wo contrast  -Continue PT/OT/ST  RESP Maintain SpO2 92%  CV Essential (primary) hypertension -Aggressive BP control, goal SBP < 130-150 for 24 hours -IV Cleviprex ordered  HEME -Monitor -transfuse for hgb < 7  ENDO -goal HgbA1c < 7  Fluid/Electrolyte Disorders Hypokalemia - KCL ordered -Replete -Repeat labs -Trend  ID -CXR -NPO -Monitor  Nutrition NPO until SLP -diet consult  Prophylaxis DVT: SCDs GI: Protonix Bowel: Senna  Dispo: Pending   Diet: NPO until cleared by speech  Code Status: Full Code    THE FOLLOWING WERE PRESENT ON ADMISSION: CNS -ICH, Seizure Resp- COPD Cardiovascular - HTN, HLD, Tachycardia  Seizure precautions: Per [facility] statutes, patients with seizures are not allowed to drive until they have been seizure-free for six months and cleared by a physician  Use caution when using heavy equipment or power tools. Avoid working on ladders or at  heights. Take showers instead of baths. Ensure the water temperature is not too high on the home water heater. Do not go swimming alone. Do not lock yourself in a room alone (i.e. bathroom). When caring for infants or [place] children, sit down when holding, feeding, or changing them to minimize risk of injury to the child in the event you have a seizure. Maintain good sleep hygiene. Avoid alcohol.     Patient seen and examined by NP/APP with MD. MD to update note as needed.   Elmer Picker, DNP, FNP-BC [facility] Pager: [phone]  Attending addendum Patient seen and examined Transfer from [facility] after the CT head showed a left temporal ICH. Suspect this might be a ischemic stroke with hemorrhagic conversion since last known well is a few days according to the son. Remains extremely aphasic with mild right hemiparesis. Needs ICU care and post ICH monitoring and workup as above. Stroke team will continue to follow Plan was discussed initially with the ED provider Dr. Suezanne Jacquet as well as her son at bedside   -- Milon Dikes, MD Neurologist [facility] Pager: [phone]      CRITICAL CARE ATTESTATION Performed by: Milon Dikes, MD Total critical care time: 39 minutes Critical care time was exclusive of separately billable procedures and treating other patients and/or supervising APPs/Residents/Students Critical care was necessary to treat or prevent imminent or life-threatening deterioration. This patient is critically ill and at significant risk for neurological worsening and/or death and care requires constant monitoring. Critical care was time spent personally by me on the following activities: development of  treatment plan with patient and/or surrogate as well as nursing, discussions with consultants, evaluation of patient's response to treatment, examination of patient, obtaining history from patient or surrogate, ordering and performing treatments and interventions, ordering and review of laboratory studies, ordering and review of radiographic studies, pulse oximetry, re-evaluation of patient's condition, participation in multidisciplinary rounds and medical decision making of high complexity in the care of this patient.

## 2023-08-06 NOTE — ED Notes (Addendum)
Appears to be agitated by family at [facility], BP cuff or voiding with purewick. BP and HR back up. sBP 122, HR 118. Will continue to monitor.

## 2023-08-06 NOTE — Progress Notes (Signed)
LTM EEG hooked up and running - no initial skin breakdown - push button tested - [facility] monitoring. Had to wrap head because per family pt will pull leads off and she is tossing her head back and forth. GMD/QM

## 2023-08-06 NOTE — ED Notes (Signed)
Pt sleeping. Family stepping away. No changes.

## 2023-08-06 NOTE — ED Notes (Signed)
Sz pads in place, no sz activity, no changes. Blood obtained and sent. EDP at [facility] VSS.

## 2023-08-06 NOTE — ED Notes (Signed)
Xray finished at [facility]. NT x2 at [facility] for in and out urine.

## 2023-08-06 NOTE — ED Notes (Signed)
EDP at [facility] speaking with family 

## 2023-08-06 NOTE — ED Notes (Signed)
ED TO INPATIENT HANDOFF REPORT  ED Nurse Name and Phone #: Al Corpus, RN [phone]  S Name/Age/Gender Penny [name] 89 y.o. female Room/Bed: WA17/WA17  Code Status   Code Status: Prior  Home/SNF/Other Home Not oriented or alert at this time Is this baseline? No   Triage Complete: Triage complete  Chief Complaint ICH (intracerebral hemorrhage) (HCC) [I61.9]  Triage Note BIB [facility] from home, living with family, here s/p sz activity described by family, heard grunting and saw tremors, then became unresponsive. Unresponsive on EMS arrival. Became responsive to voice enroute. Initial SPO2 87% on RA, placed on  up to 97%. HR 115, RR 18, BP 170/82. CBG 148, ETCo2 30-35. EKG ST. 18g R AC placed. Family to follow. H/o Lung CA and COPD. No respiratory distress. Incontinent of urine. No sz activity for EMS or on arrival. No oral trauma. Pt alert, awake, fidgety and mumbling, NAD.Not able to comprehend/ follow commands. MAEx4. Left gaze possible.   Addendum: family adds at 10:26: pt broke her R humerus in 2 places in fall 6/7, but also fell recently, not sure if she hit her head.    Allergies Allergies  Allergen Reactions   Ace Inhibitors Cough   Calcium-Containing Compounds Other (See Comments)    constpation   Lactose Intolerance (Gi) Diarrhea   Polysporin [Bacitracin-Polymyxin B] Rash    Polysporin or neosporin gave her a rash, couldn't remember which    Level of Care/Admitting Diagnosis ED Disposition     ED Disposition  Admit   Condition  --   Comment  Hospital Area: [facility] [[id number]]  Level of Care: ICU [6]  May admit patient to [facility] or [facility] if equivalent level of care is available:: No  Covid Evaluation: Asymptomatic - no recent exposure (last 10 days) testing not required  Diagnosis: ICH (intracerebral hemorrhage) [facility]) [[id number]]  Admitting Physician: Milon Dikes [[id number]]  Attending Physician: Gabriel Rung, MD [phone]   Certification:: I certify this patient will need inpatient services for at least 2 midnights  Expected Medical Readiness: 08/13/2023          B Medical/Surgery History Past Medical History:  Diagnosis Date   COPD (chronic obstructive pulmonary disease) with emphysema (HCC)    COVID-19 12/09/2019   Hyperlipidemia    Hypertension    Lung cancer, upper lobe (HCC) 12/04/1996   RUL   Osteoporosis    S/P partial lobectomy of lung    [id number]   Thigh shingles 12/04/1996   Thoracic aorta atherosclerosis (HCC)    Vitamin D deficiency    Past Surgical History:  Procedure Laterality Date   CARPAL TUNNEL RELEASE Right    2009   LUNG REMOVAL, PARTIAL Right    RUL 1998   OLECRANON BURSECTOMY Left 03/20/2017   Procedure: EXICISION OLECRANON BURSA LEFT ELBOW;  Surgeon: Cindee Salt, MD;  Location: [facility];  Service: Orthopedics;  Laterality: Left;  with scb block in preop   SHOULDER ADHESION RELEASE Right    2004     A IV Location/Drains/Wounds Patient Lines/Drains/Airways Status     Active Line/Drains/Airways     Name Placement date Placement time Site Days   Peripheral IV 08/06/23 18 G Right Antecubital 08/06/23  0911  Antecubital  less than 1   Peripheral IV 08/06/23 20 G 1" Distal;Right;Posterior;Lateral Forearm 08/06/23  1300  Forearm  less than 1            Intake/Output Last 24 hours  Intake/Output Summary (Last 24  hours) at 08/06/2023 1350 Last data filed at 08/06/2023 1215 Gross per 24 hour  Intake 1250 ml  Output --  Net 1250 ml    Labs/Imaging Results for orders placed or performed during the hospital encounter of 08/06/23 (from the past 48 hour(s))  Basic metabolic panel     Status: Abnormal   Collection Time: 08/06/23  9:46 AM  Result Value Ref Range   Sodium 138 135 - 145 mmol/L   Potassium 3.3 (L) 3.5 - 5.1 mmol/L   Chloride 102 98 - 111 mmol/L   CO2 25 22 - 32 mmol/L   Glucose, Bld 171 (H) 70 - 99 mg/dL    Comment: Glucose reference range  applies only to samples taken after fasting for at least 8 hours.   BUN 15 8 - 23 mg/dL   Creatinine, Ser 5.63 0.44 - 1.00 mg/dL   Calcium 8.9 8.9 - 87.5 mg/dL   GFR, Estimated >64 >33 mL/min    Comment: (NOTE) Calculated using the CKD-EPI Creatinine Equation (2021)    Anion gap 11 5 - 15    Comment: Performed at [facility], [address] [place], [place] [address]  CBC with Differential     Status: None   Collection Time: 08/06/23  9:46 AM  Result Value Ref Range   WBC 8.7 4.0 - 10.5 K/uL   RBC 4.68 3.87 - 5.11 MIL/uL   Hemoglobin 12.9 12.0 - 15.0 g/dL   HCT 84.1 66.0 - 63.0 %   MCV 86.3 80.0 - 100.0 fL   MCH 27.6 26.0 - 34.0 pg   MCHC 31.9 30.0 - 36.0 g/dL   RDW 16.0 10.9 - 32.3 %   Platelets 165 150 - 400 K/uL   nRBC 0.0 0.0 - 0.2 %   Neutrophils Relative % 83 %   Neutro Abs 7.2 1.7 - 7.7 K/uL   Lymphocytes Relative 8 %   Lymphs Abs 0.7 0.7 - 4.0 K/uL   Monocytes Relative 9 %   Monocytes Absolute 0.8 0.1 - 1.0 K/uL   Eosinophils Relative 0 %   Eosinophils Absolute 0.0 0.0 - 0.5 K/uL   Basophils Relative 0 %   Basophils Absolute 0.0 0.0 - 0.1 K/uL   Immature Granulocytes 0 %   Abs Immature Granulocytes 0.03 0.00 - 0.07 K/uL    Comment: Performed at [facility], [address] [place], [place] [address]  Hepatic function panel     Status: Abnormal   Collection Time: 08/06/23  9:46 AM  Result Value Ref Range   Total Protein 6.7 6.5 - 8.1 g/dL   Albumin 3.7 3.5 - 5.0 g/dL   AST 19 15 - 41 U/L   ALT 16 0 - 44 U/L   Alkaline Phosphatase 234 (H) 38 - 126 U/L   Total Bilirubin 0.8 0.3 - 1.2 mg/dL   Bilirubin, Direct 0.1 0.0 - 0.2 mg/dL   Indirect Bilirubin 0.7 0.3 - 0.9 mg/dL    Comment: Performed at [facility], [address] [place], [place] [address]  Protime-INR     Status: None   Collection Time: 08/06/23  9:46 AM  Result Value Ref Range   Prothrombin Time 13.3 11.4 - 15.2 seconds   INR 1.0 0.8 - 1.2     Comment: (NOTE) INR goal varies based on device and disease states. Performed at [facility], [address], [place], [place] [address]   Ethanol     Status: None  Collection Time: 08/06/23  9:46 AM  Result Value Ref Range   Alcohol, Ethyl (B) <10 <10 mg/dL    Comment: (NOTE) Lowest detectable limit for serum alcohol is 10 mg/dL.  For medical purposes only. Performed at [facility], [address], [place], [place] [address]   TSH     Status: None   Collection Time: 08/06/23  9:46 AM  Result Value Ref Range   TSH 0.748 0.350 - 4.500 uIU/mL    Comment: Performed by a 3rd Generation assay with a functional sensitivity of <=0.01 uIU/mL. Performed at [facility], [address] [place], [place] [address]   Ammonia     Status: None   Collection Time: 08/06/23  9:46 AM  Result Value Ref Range   Ammonia 19 9 - 35 umol/L    Comment: Performed at [facility], [address], [place], [place] [address]  CBG monitoring, ED     Status: Abnormal   Collection Time: 08/06/23 10:08 AM  Result Value Ref Range   Glucose-Capillary 165 (H) 70 - 99 mg/dL    Comment: Glucose reference range applies only to samples taken after fasting for at least 8 hours.  Blood gas, venous     Status: Abnormal   Collection Time: 08/06/23 10:12 AM  Result Value Ref Range   pH, Ven 7.43 7.25 - 7.43   pCO2, Ven 45 44 - 60 mmHg   pO2, Ven 32 32 - 45 mmHg   Bicarbonate 29.9 (H) 20.0 - 28.0 mmol/L   Acid-Base Excess 4.8 (H) 0.0 - 2.0 mmol/L   O2 Saturation 59.9 %   Patient temperature 37.0     Comment: Performed at [facility], [address], [place], [place] [address]  I-Stat Lactic Acid     Status: None   Collection Time: 08/06/23 10:17 AM  Result Value Ref Range   Lactic Acid, Venous 1.5 0.5 - 1.9 mmol/L  Urinalysis, Routine w reflex microscopic -Urine, Clean Catch     Status: Abnormal   Collection  Time: 08/06/23 10:24 AM  Result Value Ref Range   Color, Urine YELLOW YELLOW   APPearance CLEAR CLEAR   Specific Gravity, Urine 1.016 1.005 - 1.030   pH 6.0 5.0 - 8.0   Glucose, UA 150 (A) NEGATIVE mg/dL   Hgb urine dipstick NEGATIVE NEGATIVE   Bilirubin Urine NEGATIVE NEGATIVE   Ketones, ur 20 (A) NEGATIVE mg/dL   Protein, ur 564 (A) NEGATIVE mg/dL   Nitrite NEGATIVE NEGATIVE   Leukocytes,Ua NEGATIVE NEGATIVE   RBC / HPF 0-5 0 - 5 RBC/hpf   WBC, UA 0-5 0 - 5 WBC/hpf   Bacteria, UA RARE (A) NONE SEEN   Squamous Epithelial / HPF 0-5 0 - 5 /HPF   Mucus PRESENT    Hyaline Casts, UA PRESENT     Comment: Performed at [facility], [address] [place], [place] [address]  Urine rapid drug screen (hosp performed)     Status: Abnormal   Collection Time: 08/06/23 10:24 AM  Result Value Ref Range   Opiates POSITIVE (A) NONE DETECTED   Cocaine NONE DETECTED NONE DETECTED   Benzodiazepines NONE DETECTED NONE DETECTED   Amphetamines NONE DETECTED NONE DETECTED   Tetrahydrocannabinol NONE DETECTED NONE DETECTED   Barbiturates NONE DETECTED NONE DETECTED    Comment: (NOTE) DRUG SCREEN FOR MEDICAL PURPOSES ONLY.  IF CONFIRMATION IS NEEDED FOR ANY PURPOSE, NOTIFY LAB WITHIN 5 DAYS.  LOWEST DETECTABLE LIMITS  FOR URINE DRUG SCREEN Drug Class                     Cutoff (ng/mL) Amphetamine and metabolites    1000 Barbiturate and metabolites    200 Benzodiazepine                 200 Opiates and metabolites        300 Cocaine and metabolites        300 THC                            50 Performed at [facility], [address] [place], [place] [address]    CT ANGIO HEAD NECK W WO CM  Result Date: 08/06/2023 CLINICAL DATA:  Neuro deficit, acute, stroke suspected; Stroke, hemorrhagic. EXAM: CT ANGIOGRAPHY HEAD AND NECK WITH AND WITHOUT CONTRAST CT VENOGRAM TECHNIQUE: Multidetector CT imaging of the head and neck was performed using the standard  protocol during bolus administration of intravenous contrast. Multiplanar CT image reconstructions and MIPs were obtained to evaluate the vascular anatomy. Carotid stenosis measurements (when applicable) are obtained utilizing NASCET criteria, using the distal internal carotid diameter as the denominator. Venographic phase images of the brain were obtained following the administration of intravenous contrast. Multiplanar reformats and maximum intensity projections were generated. RADIATION DOSE REDUCTION: This exam was performed according to the departmental dose-optimization program which includes automated exposure control, adjustment of the mA and/or kV according to patient size and/or use of iterative reconstruction technique. CONTRAST:  75mL OMNIPAQUE IOHEXOL 350 MG/ML SOLN COMPARISON:  CT head from the same day. FINDINGS: CTA NECK FINDINGS Aortic arch: Great vessel origins are patent.  Atherosclerosis. Right carotid system: No evidence of dissection, stenosis (50% or greater), or occlusion. Left carotid system: No evidence of dissection, stenosis (50% or greater), or occlusion. Vertebral arteries: Left dominant. No evidence of dissection, stenosis (50% or greater), or occlusion. Skeleton: Further evaluated on dedicated concurrent CT of the cervical spine. Other neck: No acute abnormality on limited assessment. Subcentimeter thyroid nodule does not require further imaging follow-up (ref: J Am Coll Radiol. 2015 Feb;12(2): 143-50). Upper chest: Emphysema. Review of the MIP images confirms the above findings CTA HEAD FINDINGS Anterior circulation: Hypoplastic right A1 ACA. Otherwise, bilateral intracranial ICAs, MCAs and ACAs are patent without proximal hemodynamically significant stenosis. 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 Posterior circulation: Bilateral intradural vertebral arteries, basilar artery and  bilateral posterior cerebral arteries are patent without proximal hemodynamically significant stenosis. Anatomic variants: Detailed above. Review of the MIP images confirms the above findings CT VENOGRAM FINDINGS No evidence of dural venous sinus thrombosis. Specifically, the superior sagittal, sigmoid, transverse, straight, and visualized deep cerebral veins are patent. Maltese left transverse sinus. IMPRESSION: 1. No emergent large vessel occlusion or proximal hemodynamically significant stenosis. 2. No evidence of vascular malformation or aneurysm in the region of left temporal hemorrhage, although acute blood products limits assessment. 3. No dural venous sinus thrombosis. 4. 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 This is not in the region of acute hemorrhage. 5. Aortic Atherosclerosis (ICD10-I70.0) and Emphysema (ICD10-J43.9). Electronically Signed   By: Feliberto Harts M.D.   On: 08/06/2023 13:37   CT VENOGRAM HEAD  Result Date: 08/06/2023 CLINICAL DATA:  Neuro deficit, acute, stroke suspected; Stroke, hemorrhagic. EXAM: CT ANGIOGRAPHY HEAD AND NECK  WITH AND WITHOUT CONTRAST CT VENOGRAM TECHNIQUE: Multidetector CT imaging of the head and neck was performed using the standard protocol during bolus administration of intravenous contrast. Multiplanar CT image reconstructions and MIPs were obtained to evaluate the vascular anatomy. Carotid stenosis measurements (when applicable) are obtained utilizing NASCET criteria, using the distal internal carotid diameter as the denominator. Venographic phase images of the brain were obtained following the administration of intravenous contrast. Multiplanar reformats and maximum intensity projections were generated. RADIATION DOSE REDUCTION: This exam was performed according to the departmental dose-optimization program which includes automated exposure control, adjustment of the mA and/or kV according to patient size  and/or use of iterative reconstruction technique. CONTRAST:  75mL OMNIPAQUE IOHEXOL 350 MG/ML SOLN COMPARISON:  CT head from the same day. FINDINGS: CTA NECK FINDINGS Aortic arch: Great vessel origins are patent.  Atherosclerosis. Right carotid system: No evidence of dissection, stenosis (50% or greater), or occlusion. Left carotid system: No evidence of dissection, stenosis (50% or greater), or occlusion. Vertebral arteries: Left dominant. No evidence of dissection, stenosis (50% or greater), or occlusion. Skeleton: Further evaluated on dedicated concurrent CT of the cervical spine. Other neck: No acute abnormality on limited assessment. Subcentimeter thyroid nodule does not require further imaging follow-up (ref: J Am Coll Radiol. 2015 Feb;12(2): 143-50). Upper chest: Emphysema. Review of the MIP images confirms the above findings CTA HEAD FINDINGS Anterior circulation: Hypoplastic right A1 ACA. Otherwise, bilateral intracranial ICAs, MCAs and ACAs are patent without proximal hemodynamically significant stenosis. 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 Posterior circulation: Bilateral intradural vertebral arteries, basilar artery and bilateral posterior cerebral arteries are patent without proximal hemodynamically significant stenosis. Anatomic variants: Detailed above. Review of the MIP images confirms the above findings CT VENOGRAM FINDINGS No evidence of dural venous sinus thrombosis. Specifically, the superior sagittal, sigmoid, transverse, straight, and visualized deep cerebral veins are patent. Puchalski left transverse sinus. IMPRESSION: 1. No emergent large vessel occlusion or proximal hemodynamically significant stenosis. 2. No evidence of vascular malformation or aneurysm in the region of left temporal hemorrhage, although acute blood products limits assessment. 3. No dural venous sinus thrombosis. 4.  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 This is not in the region of acute hemorrhage. 5. Aortic Atherosclerosis (ICD10-I70.0) and Emphysema (ICD10-J43.9). Electronically Signed   By: Feliberto Harts M.D.   On: 08/06/2023 13:37   CT C-SPINE NO CHARGE  Result Date: 08/06/2023 CLINICAL DATA:  Seizure, decreased responsiveness. Intracranial hemorrhage EXAM: CT CERVICAL SPINE WITHOUT CONTRAST TECHNIQUE: Multidetector CT imaging of the cervical spine was performed without intravenous contrast. Multiplanar CT image reconstructions were also generated. RADIATION DOSE REDUCTION: This exam was performed according to the departmental dose-optimization program which includes automated exposure control, adjustment of the mA and/or kV according to patient size and/or use of iterative reconstruction technique. COMPARISON:  06/01/2023 FINDINGS: Alignment: Facet joints are aligned without dislocation or traumatic listhesis. Dens and lateral masses are aligned. Skull base and vertebrae: No acute fracture. No primary bone lesion or focal pathologic process. Incomplete fusion the posterior arch of C1. Soft tissues and spinal canal: No prevertebral fluid or swelling. No visible canal hematoma. Disc levels: Advanced multilevel cervical spondylosis, most pronounced C4-5 through C6-7. Upper chest: Emphysema.  Aortic atherosclerosis. Other: Carotid atherosclerosis. IMPRESSION: 1. No acute fracture or traumatic listhesis of the cervical spine. 2. Advanced multilevel cervical spondylosis. Aortic Atherosclerosis (ICD10-I70.0) and Emphysema (ICD10-J43.9). Electronically Signed  By: Duanne Guess D.O.   On: 08/06/2023 13:27   CT Head Wo Contrast  Addendum Date: 08/06/2023   ADDENDUM REPORT: 08/06/2023 11:24 ADDENDUM: Study discussed by telephone with Dr. Alvino Blood on 08/06/2023 at 1106 hours. Electronically Signed   By: Odessa Fleming M.D.   On: 08/06/2023 11:24   Result Date:  08/06/2023 CLINICAL DATA:  89 year old female status post seizure activity, subsequently unresponsive. EXAM: CT HEAD WITHOUT CONTRAST TECHNIQUE: Contiguous axial images were obtained from the base of the skull through the vertex without intravenous contrast. RADIATION DOSE REDUCTION: This exam was performed according to the departmental dose-optimization program which includes automated exposure control, adjustment of the mA and/or kV according to patient size and/or use of iterative reconstruction technique. COMPARISON:  Brain MRI 06/25/2023.  Head CT 06/01/2023. FINDINGS: Brain: Hyperdense hemorrhage in the left temporal lobe is new from the MRI last month, affecting both the temporal white matter and inferior gyrus and encompasses 26 x 16 x 16 mm (AP by transverse by CC), estimated 3-4 mL volume. Adjacent temporal lobe edema. No significant regional mass effect. No IVH. No extra-axial blood identified. Stable ventricle size and configuration. No midline shift. Vascular/dystrophic calcifications in the bilateral basal ganglia are stable. No other intracranial hemorrhage is evident by CT, chronic hemosiderin in the brain last month was limited to the right hemisphere. Confluent, extensive bilateral cerebral white matter hypodensity appears stable. Normal basilar cisterns. Vascular: No suspicious intracranial vascular hyperdensity. Calcified atherosclerosis at the skull base. Skull: Stable.  No acute osseous abnormality identified. Sinuses/Orbits: Visualized paranasal sinuses and mastoids are stable and well aerated. Other: No acute orbit or scalp soft tissue injury identified. IMPRESSION: 1. Positive for Dobratz acute intra-axial hemorrhage in the Left temporal lobe, estimated 3-4 mL. Surrounding edema but no significant mass effect. 2. No extra-axial extension, IVH, or other complicating features. And no other acute intracranial abnormality. Advanced chronic white matter disease. Right hemisphere chronic  microhemorrhages in the brain as detailed by MRI last month. Electronically Signed: By: Odessa Fleming M.D. On: 08/06/2023 11:02   DG Chest Port 1 View  Result Date: 08/06/2023 CLINICAL DATA:  Confusion.  Seizure activity. EXAM: PORTABLE CHEST 1 VIEW COMPARISON:  06/01/2023 and back to 02/07/2021 FINDINGS: Per technologist, the patient's bra could not be removed. There also multiple wires and leads which project over the chest. Remote right rib fractures. Left humeral shaft fracture is incompletely imaged but was present on the prior. Mild tracheal deviation to the right. Surgical changes project over the right-side of the mediastinum. Borderline right paratracheal soft tissue fullness is similar to 02/27/2021 and could be postoperative or projectional in the setting of obliquity. No pleural effusion or pneumothorax. Clear lungs. IMPRESSION: No acute cardiopulmonary disease Electronically Signed   By: Jeronimo Greaves M.D.   On: 08/06/2023 10:27    Pending Labs Unresulted Labs (From admission, onward)    None       Vitals/Pain Today's Vitals   08/06/23 1210 08/06/23 1300 08/06/23 1316 08/06/23 1345  BP: (!) 154/89 (!) 167/107  135/72  Pulse: (!) 107 (!) 103  (!) 104  Resp: (!) 25 19  18   Temp:   99.8 F (37.7 C)   TempSrc:   Axillary   SpO2: 94% 94%  91%  Weight:        Isolation Precautions No active isolations  Medications Medications  nicardipine (CARDENE) 20mg  in 0.86% saline IV infusion (0.1 mg/ml) (2 mg/hr Intravenous New Bag/Given 08/06/23 1311)  acetaminophen (OFIRMEV) IV  1,000 mg (has no administration in time range)  sodium chloride 0.9 % bolus 1,000 mL (0 mLs Intravenous Stopped 08/06/23 1215)  levETIRAcetam (KEPPRA) 2,000 mg in sodium chloride 0.9 % 250 mL IVPB (0 mg Intravenous Stopped 08/06/23 1215)  iohexol (OMNIPAQUE) 350 MG/ML injection 75 mL (75 mLs Intravenous Contrast Given 08/06/23 1221)    Mobility Unsure of baseline     Focused Assessments Neuro Assessment  Handoff:  Swallow screen pass? No  Cardiac Rhythm: Sinus tachycardia       Neuro Assessment:   Neuro Checks:      Has TPA been given? No If patient is a Neuro Trauma and patient is going to OR before floor call report to 4N Charge nurse: [phone] or [phone]   R Recommendations: See Admitting Provider Note  Report given to:   Additional Notes: Family x2 at [facility], no sz activity in ED, keppra given, giving IV tylenol, cardene started and remains at 2, arousable to voice, unintelligible speech, does not follow commands, reacts to discomfort of BP cuff, currently on R calf, fell in June, broke L humerus, in splint, Larey Seat a few days ago also. Family noticed change yesterday/today.

## 2023-08-06 NOTE — ED Provider Notes (Signed)
[place] EMERGENCY DEPARTMENT AT [facility] Provider Note  CSN: [id number] Arrival date & time: 08/06/23 4132  Chief Complaint(s) Seizures  HPI Penny [name] is a 89 y.o. female history of COPD, hyperlipidemia, hypertension presenting to the emergency department with altered mental status.  Per EMS patient apparently had episode of unresponsiveness, tremors.  On arrival patient was not responsive and became more responsive en route with EMS.  Was hypoxic with EMS arrival.  Glucose was reassuring for EMS.  Patient was apparently incontinent of urine.  History otherwise limited due to altered mental status.   Past Medical History Past Medical History:  Diagnosis Date   COPD (chronic obstructive pulmonary disease) with emphysema (HCC)    COVID-19 12/09/2019   Hyperlipidemia    Hypertension    Lung cancer, upper lobe (HCC) 12/04/1996   RUL   Osteoporosis    S/P partial lobectomy of lung    [id number]   Thigh shingles 12/04/1996   Thoracic aorta atherosclerosis (HCC)    Vitamin D deficiency    Patient Active Problem List   Diagnosis Date Noted   ICH (intracerebral hemorrhage) (HCC) 08/06/2023   Intracranial hemorrhage (HCC) 06/01/2023   Closed fracture of left proximal humerus 06/01/2023   Fall at home, initial encounter 06/01/2023   History of COPD 06/01/2023   Allergic rhinitis 06/01/2023   Depression 06/01/2023   Intraparenchymal hematoma of brain (HCC) 06/01/2023   Closed fracture of shaft of left humerus 06/01/2023   Rigidity (muscles) 05/21/2023   Traumatic head injury less than 3 months ago 05/21/2023   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   Malignant neoplasm of upper lobe of lung, unspecified laterality (HCC) 01/07/2023   Syncope    Symptomatic sinus bradycardia 09/10/2022   Hypokalemia 09/10/2022   Hypomagnesemia 09/10/2022   CAD (coronary artery disease) 09/10/2022   COVID-19 (11/30/2021) 12/01/2021   B12 deficiency 06/28/2020   Abnormal glucose  10/09/2018   Osteoporosis 12/28/2015   COPD (chronic obstructive pulmonary disease) with emphysema (HCC) 02/25/2015   Atherosclerosis of aorta (HCC) BY ct SCAN IN 2015 02/25/2015   Essential hypertension    Hyperlipidemia, mixed    Vitamin D deficiency    Personal history of lung cancer 12/04/1996   Home Medication(s) Prior to Admission medications   Medication Sig Start Date End Date Taking? Authorizing Provider  acetaminophen (TYLENOL) 325 MG tablet Take 650 mg by mouth as needed for mild pain or moderate pain.   Yes [provider]  aspirin 81 MG chewable tablet Chew 81 mg by mouth every evening.   Yes [provider]  Budeson-Glycopyrrol-Formoterol (BREZTRI AEROSPHERE) 160-9-4.8 MCG/ACT AERO Inhale 1 puff into the lungs in the morning and at bedtime. 03/21/23  Yes Raynelle Dick, NP  buPROPion (WELLBUTRIN) 75 MG tablet One tablet once a day Patient taking differently: Take 75 mg by mouth daily. 05/14/23  Yes Raynelle Dick, NP  Cholecalciferol 125 MCG (5000 UT) capsule Take 1 capsules Daily Patient taking differently: Take 5,000 Units by mouth daily. 12/01/21  Yes Judd Gaudier, NP  diphenhydramine-acetaminophen (TYLENOL PM) 25-500 MG TABS tablet Take 1 tablet by mouth at bedtime as needed (prn).   Yes [provider]  HYDROcodone-acetaminophen (NORCO/VICODIN) 5-325 MG tablet Take 0.5 tablets by mouth every 6 (six) hours as needed for moderate pain. 07/09/23  Yes [provider]  montelukast (SINGULAIR) 10 MG tablet Take  1 tablet  Daily for Allergies                                                            /  TAKE                      BY                     MOUTH Patient taking differently: Take 10 mg by mouth every morning. 05/08/23  Yes Lucky Cowboy, MD  Cyanocobalamin (VITAMIN B-12) 1000 MCG SUBL Takes 1 tab SL Daily Patient taking differently: Take 1 tablet by mouth daily. 05/02/19   Lucky Cowboy, MD                                                                                                                                     Past Surgical History Past Surgical History:  Procedure Laterality Date   CARPAL TUNNEL RELEASE Right    2009   LUNG REMOVAL, PARTIAL Right    RUL 1998   OLECRANON BURSECTOMY Left 03/20/2017   Procedure: EXICISION OLECRANON BURSA LEFT ELBOW;  Surgeon: Cindee Salt, MD;  Location: [facility];  Service: Orthopedics;  Laterality: Left;  with scb block in preop   SHOULDER ADHESION RELEASE Right    2004   Family History Family History  Problem Relation Age of Onset   Heart disease Mother    Lung cancer Father    Breast cancer Paternal Aunt     Social History Social History   Tobacco Use   Smoking status: Former    Current packs/day: 0.00    Types: Cigarettes    Quit date: 12/04/1996    Years since quitting: 26.6   Smokeless tobacco: Never  Vaping Use   Vaping status: Never Used  Substance Use Topics   Alcohol use: Yes    Alcohol/week: 1.0 standard drink of alcohol    Types: 1 Glasses of wine per week    Comment: social   Drug use: No   Allergies Ace inhibitors, Calcium-containing compounds, Lactose intolerance (gi), and Polysporin [bacitracin-polymyxin b]  Review of Systems Review of Systems  Unable to perform ROS: Mental status change    Physical Exam Vital Signs  I have reviewed the triage vital signs BP (!) 167/107   Pulse (!) 103   Temp 99.8 F (37.7 C) (Axillary)   Resp 19   Wt 63 kg   SpO2 94%   BMI 25.40 kg/m  Physical Exam Vitals and nursing note reviewed.  Constitutional:      General: She is not in acute distress.    Appearance: She is well-developed.  HENT:     Head: Normocephalic and atraumatic.     Mouth/Throat:     Mouth: Mucous membranes are dry.  Eyes:     Extraocular Movements: Extraocular movements intact.     Pupils: Pupils are equal, round, and reactive to light.  Cardiovascular:     Rate and Rhythm:  Regular rhythm. Tachycardia present.     Heart sounds: No murmur heard. Pulmonary:  Effort: Pulmonary effort is normal. No respiratory distress.     Breath sounds: Normal breath sounds.  Abdominal:     General: Abdomen is flat.     Palpations: Abdomen is soft.     Tenderness: There is no abdominal tenderness.  Musculoskeletal:        General: No tenderness.     Right lower leg: No edema.     Left lower leg: No edema.     Comments: Left upper extremity in cast  Skin:    General: Skin is warm and dry.  Neurological:     Mental Status: She is alert.     Comments: Tracks me around the room, no spontaneous speech, seems globally weak but moving all 4 extremities equally, not following commands, no obvious cranial nerve abnormality, extraocular movements intact  Psychiatric:     Comments: Unable to assess     ED Results and Treatments Labs (all labs ordered are listed, but only abnormal results are displayed) Labs Reviewed  BASIC METABOLIC PANEL - Abnormal; Notable for the following components:      Result Value   Potassium 3.3 (*)    Glucose, Bld 171 (*)    All other components within normal limits  URINALYSIS, ROUTINE W REFLEX MICROSCOPIC - Abnormal; Notable for the following components:   Glucose, UA 150 (*)    Ketones, ur 20 (*)    Protein, ur 100 (*)    Bacteria, UA RARE (*)    All other components within normal limits  HEPATIC FUNCTION PANEL - Abnormal; Notable for the following components:   Alkaline Phosphatase 234 (*)    All other components within normal limits  RAPID URINE DRUG SCREEN, HOSP PERFORMED - Abnormal; Notable for the following components:   Opiates POSITIVE (*)    All other components within normal limits  BLOOD GAS, VENOUS - Abnormal; Notable for the following components:   Bicarbonate 29.9 (*)    Acid-Base Excess 4.8 (*)    All other components within normal limits  CBG MONITORING, ED - Abnormal; Notable for the following components:    Glucose-Capillary 165 (*)    All other components within normal limits  CBC WITH DIFFERENTIAL/PLATELET  PROTIME-INR  ETHANOL  TSH  AMMONIA  I-STAT CG4 LACTIC ACID, ED                                                                                                                          Radiology CT ANGIO HEAD NECK W WO CM  Result Date: 08/06/2023 CLINICAL DATA:  Neuro deficit, acute, stroke suspected; Stroke, hemorrhagic. EXAM: CT ANGIOGRAPHY HEAD AND NECK WITH AND WITHOUT CONTRAST CT VENOGRAM TECHNIQUE: Multidetector CT imaging of the head and neck was performed using the standard protocol during bolus administration of intravenous contrast. Multiplanar CT image reconstructions and MIPs were obtained to evaluate the vascular anatomy. Carotid stenosis measurements (when applicable) are obtained utilizing NASCET criteria, using the distal internal carotid diameter as the denominator. Venographic phase  images of the brain were obtained following the administration of intravenous contrast. Multiplanar reformats and maximum intensity projections were generated. RADIATION DOSE REDUCTION: This exam was performed according to the departmental dose-optimization program which includes automated exposure control, adjustment of the mA and/or kV according to patient size and/or use of iterative reconstruction technique. CONTRAST:  75mL OMNIPAQUE IOHEXOL 350 MG/ML SOLN COMPARISON:  CT head from the same day. FINDINGS: CTA NECK FINDINGS Aortic arch: Great vessel origins are patent.  Atherosclerosis. Right carotid system: No evidence of dissection, stenosis (50% or greater), or occlusion. Left carotid system: No evidence of dissection, stenosis (50% or greater), or occlusion. Vertebral arteries: Left dominant. No evidence of dissection, stenosis (50% or greater), or occlusion. Skeleton: Further evaluated on dedicated concurrent CT of the cervical spine. Other neck: No acute abnormality on limited assessment.  Subcentimeter thyroid nodule does not require further imaging follow-up (ref: J Am Coll Radiol. 2015 Feb;12(2): 143-50). Upper chest: Emphysema. Review of the MIP images confirms the above findings CTA HEAD FINDINGS Anterior circulation: Hypoplastic right A1 ACA. Otherwise, bilateral intracranial ICAs, MCAs and ACAs are patent without proximal hemodynamically significant stenosis. 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 Posterior circulation: Bilateral intradural vertebral arteries, basilar artery and bilateral posterior cerebral arteries are patent without proximal hemodynamically significant stenosis. Anatomic variants: Detailed above. Review of the MIP images confirms the above findings CT VENOGRAM FINDINGS No evidence of dural venous sinus thrombosis. Specifically, the superior sagittal, sigmoid, transverse, straight, and visualized deep cerebral veins are patent. Mcaleer left transverse sinus. IMPRESSION: 1. No emergent large vessel occlusion or proximal hemodynamically significant stenosis. 2. No evidence of vascular malformation or aneurysm in the region of left temporal hemorrhage, although acute blood products limits assessment. 3. No dural venous sinus thrombosis. 4. 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 This is not in the region of acute hemorrhage. 5. Aortic Atherosclerosis (ICD10-I70.0) and Emphysema (ICD10-J43.9). Electronically Signed   By: Feliberto Harts M.D.   On: 08/06/2023 13:37   CT VENOGRAM HEAD  Result Date: 08/06/2023 CLINICAL DATA:  Neuro deficit, acute, stroke suspected; Stroke, hemorrhagic. EXAM: CT ANGIOGRAPHY HEAD AND NECK WITH AND WITHOUT CONTRAST CT VENOGRAM TECHNIQUE: Multidetector CT imaging of the head and neck was performed using the standard protocol during bolus administration of intravenous contrast. Multiplanar CT image  reconstructions and MIPs were obtained to evaluate the vascular anatomy. Carotid stenosis measurements (when applicable) are obtained utilizing NASCET criteria, using the distal internal carotid diameter as the denominator. Venographic phase images of the brain were obtained following the administration of intravenous contrast. Multiplanar reformats and maximum intensity projections were generated. RADIATION DOSE REDUCTION: This exam was performed according to the departmental dose-optimization program which includes automated exposure control, adjustment of the mA and/or kV according to patient size and/or use of iterative reconstruction technique. CONTRAST:  75mL OMNIPAQUE IOHEXOL 350 MG/ML SOLN COMPARISON:  CT head from the same day. FINDINGS: CTA NECK FINDINGS Aortic arch: Great vessel origins are patent.  Atherosclerosis. Right carotid system: No evidence of dissection, stenosis (50% or greater), or occlusion. Left carotid system: No evidence of dissection, stenosis (50% or greater), or occlusion. Vertebral arteries: Left dominant. No evidence of dissection, stenosis (50% or greater), or occlusion. Skeleton: Further evaluated on dedicated concurrent CT of the cervical spine. Other neck: No acute abnormality on limited assessment. Subcentimeter thyroid nodule does not require further imaging follow-up (ref: J Am  Coll Radiol. 2015 Feb;12(2): 143-50). Upper chest: Emphysema. Review of the MIP images confirms the above findings CTA HEAD FINDINGS Anterior circulation: Hypoplastic right A1 ACA. Otherwise, bilateral intracranial ICAs, MCAs and ACAs are patent without proximal hemodynamically significant stenosis. 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 Posterior circulation: Bilateral intradural vertebral arteries, basilar artery and bilateral posterior cerebral arteries are patent without proximal hemodynamically  significant stenosis. Anatomic variants: Detailed above. Review of the MIP images confirms the above findings CT VENOGRAM FINDINGS No evidence of dural venous sinus thrombosis. Specifically, the superior sagittal, sigmoid, transverse, straight, and visualized deep cerebral veins are patent. Pasquini left transverse sinus. IMPRESSION: 1. No emergent large vessel occlusion or proximal hemodynamically significant stenosis. 2. No evidence of vascular malformation or aneurysm in the region of left temporal hemorrhage, although acute blood products limits assessment. 3. No dural venous sinus thrombosis. 4. 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 This is not in the region of acute hemorrhage. 5. Aortic Atherosclerosis (ICD10-I70.0) and Emphysema (ICD10-J43.9). Electronically Signed   By: Feliberto Harts M.D.   On: 08/06/2023 13:37   CT C-SPINE NO CHARGE  Result Date: 08/06/2023 CLINICAL DATA:  Seizure, decreased responsiveness. Intracranial hemorrhage EXAM: CT CERVICAL SPINE WITHOUT CONTRAST TECHNIQUE: Multidetector CT imaging of the cervical spine was performed without intravenous contrast. Multiplanar CT image reconstructions were also generated. RADIATION DOSE REDUCTION: This exam was performed according to the departmental dose-optimization program which includes automated exposure control, adjustment of the mA and/or kV according to patient size and/or use of iterative reconstruction technique. COMPARISON:  06/01/2023 FINDINGS: Alignment: Facet joints are aligned without dislocation or traumatic listhesis. Dens and lateral masses are aligned. Skull base and vertebrae: No acute fracture. No primary bone lesion or focal pathologic process. Incomplete fusion the posterior arch of C1. Soft tissues and spinal canal: No prevertebral fluid or swelling. No visible canal hematoma. Disc levels: Advanced multilevel cervical spondylosis, most pronounced C4-5 through C6-7.  Upper chest: Emphysema.  Aortic atherosclerosis. Other: Carotid atherosclerosis. IMPRESSION: 1. No acute fracture or traumatic listhesis of the cervical spine. 2. Advanced multilevel cervical spondylosis. Aortic Atherosclerosis (ICD10-I70.0) and Emphysema (ICD10-J43.9). Electronically Signed   By: Duanne Guess D.O.   On: 08/06/2023 13:27   CT Head Wo Contrast  Addendum Date: 08/06/2023   ADDENDUM REPORT: 08/06/2023 11:24 ADDENDUM: Study discussed by telephone with Dr. Alvino Blood on 08/06/2023 at 1106 hours. Electronically Signed   By: Odessa Fleming M.D.   On: 08/06/2023 11:24   Result Date: 08/06/2023 CLINICAL DATA:  89 year old female status post seizure activity, subsequently unresponsive. EXAM: CT HEAD WITHOUT CONTRAST TECHNIQUE: Contiguous axial images were obtained from the base of the skull through the vertex without intravenous contrast. RADIATION DOSE REDUCTION: This exam was performed according to the departmental dose-optimization program which includes automated exposure control, adjustment of the mA and/or kV according to patient size and/or use of iterative reconstruction technique. COMPARISON:  Brain MRI 06/25/2023.  Head CT 06/01/2023. FINDINGS: Brain: Hyperdense hemorrhage in the left temporal lobe is new from the MRI last month, affecting both the temporal white matter and inferior gyrus and encompasses 26 x 16 x 16 mm (AP by transverse by CC), estimated 3-4 mL volume. Adjacent temporal lobe edema. No significant regional mass effect. No IVH. No extra-axial blood identified. Stable ventricle size and configuration. No midline shift. Vascular/dystrophic calcifications in the bilateral basal ganglia are stable. No other intracranial  hemorrhage is evident by CT, chronic hemosiderin in the brain last month was limited to the right hemisphere. Confluent, extensive bilateral cerebral white matter hypodensity appears stable. Normal basilar cisterns. Vascular: No suspicious intracranial vascular  hyperdensity. Calcified atherosclerosis at the skull base. Skull: Stable.  No acute osseous abnormality identified. Sinuses/Orbits: Visualized paranasal sinuses and mastoids are stable and well aerated. Other: No acute orbit or scalp soft tissue injury identified. IMPRESSION: 1. Positive for Bernardy acute intra-axial hemorrhage in the Left temporal lobe, estimated 3-4 mL. Surrounding edema but no significant mass effect. 2. No extra-axial extension, IVH, or other complicating features. And no other acute intracranial abnormality. Advanced chronic white matter disease. Right hemisphere chronic microhemorrhages in the brain as detailed by MRI last month. Electronically Signed: By: Odessa Fleming M.D. On: 08/06/2023 11:02   DG Chest Port 1 View  Result Date: 08/06/2023 CLINICAL DATA:  Confusion.  Seizure activity. EXAM: PORTABLE CHEST 1 VIEW COMPARISON:  06/01/2023 and back to 02/07/2021 FINDINGS: Per technologist, the patient's bra could not be removed. There also multiple wires and leads which project over the chest. Remote right rib fractures. Left humeral shaft fracture is incompletely imaged but was present on the prior. Mild tracheal deviation to the right. Surgical changes project over the right-side of the mediastinum. Borderline right paratracheal soft tissue fullness is similar to 02/27/2021 and could be postoperative or projectional in the setting of obliquity. No pleural effusion or pneumothorax. Clear lungs. IMPRESSION: No acute cardiopulmonary disease Electronically Signed   By: Jeronimo Greaves M.D.   On: 08/06/2023 10:27    Pertinent labs & imaging results that were available during my care of the patient were reviewed by me and considered in my medical decision making (see MDM for details).  Medications Ordered in ED Medications  nicardipine (CARDENE) 20mg  in 0.86% saline IV infusion (0.1 mg/ml) (2 mg/hr Intravenous New Bag/Given 08/06/23 1311)  acetaminophen (OFIRMEV) IV 1,000 mg (has no  administration in time range)  sodium chloride 0.9 % bolus 1,000 mL (0 mLs Intravenous Stopped 08/06/23 1215)  levETIRAcetam (KEPPRA) 2,000 mg in sodium chloride 0.9 % 250 mL IVPB (0 mg Intravenous Stopped 08/06/23 1215)  iohexol (OMNIPAQUE) 350 MG/ML injection 75 mL (75 mLs Intravenous Contrast Given 08/06/23 1221)                                                                                                                                     Procedures .Critical Care  Performed by: Lonell Grandchild, MD Authorized by: Lonell Grandchild, MD   Critical care provider statement:    Critical care time (minutes):  30   Critical care was necessary to treat or prevent imminent or life-threatening deterioration of the following conditions:  CNS failure or compromise   Critical care was time spent personally by me on the following activities:  Development of treatment plan with patient or surrogate, discussions with consultants, evaluation of  patient's response to treatment, examination of patient, ordering and review of laboratory studies, ordering and review of radiographic studies, ordering and performing treatments and interventions, pulse oximetry, re-evaluation of patient's condition and review of old charts   Care discussed with: admitting provider and accepting provider at another facility     (including critical care time)  Medical Decision Making / ED Course   MDM:  89 year old female presenting with altered mental status after episode of unresponsiveness.  Differential includes seizure, intracranial process such as bleeding or tumor, metabolic abnormality such as hyponatremia, occult infection such as pneumonia or urinalysis, will obtain broad workup including CT head and laboratory testing.  Will discuss with family to obtain additional history.  Patient noted to be hypoxic, will obtain chest x-ray.  Lungs without clear abnormality on bedside exam.  Neurologic exam slightly limited due  to patient's altered mental status not following commands but does not seem to have any focal neurologic deficit on exam.  Clinical Course as of 08/06/23 1348  Mon Aug 06, 2023  1115 Obtain further history from family, patient's daughter reports that the patient had a episode where she stiffened up and then had shaking.  Certainly does sound more like a seizure.  CT head does have a Faivre acute temporal bleed which is probable cause of seizure.  Family report she fell on her bottom a few days ago but no head injury.  No injury or falls today.  Patient had not gotten out of bed for the morning.  Give Keppra and consult neurology. [WS]  1221 Discussed with Dr. Jerrell Belfast, request CT angio and CT cervical spine.  Have been ordered.  Agrees with Keppra and nicardipine.  If no AVM/aneurysm, he will admit to neuro ICU. [WS]  1346 No AVM or aneurysm in area of bleed.  Will place admission order for neurology admission. [WS]    Clinical Course User Index [WS] Lonell Grandchild, MD     Additional history obtained: -Additional history obtained from family and ems -External records from outside source obtained and reviewed including: Chart review including previous notes, labs, imaging, consultation notes including prior admissions   Lab Tests: -I ordered, reviewed, and interpreted labs.   The pertinent results include:   Labs Reviewed  BASIC METABOLIC PANEL - Abnormal; Notable for the following components:      Result Value   Potassium 3.3 (*)    Glucose, Bld 171 (*)    All other components within normal limits  URINALYSIS, ROUTINE W REFLEX MICROSCOPIC - Abnormal; Notable for the following components:   Glucose, UA 150 (*)    Ketones, ur 20 (*)    Protein, ur 100 (*)    Bacteria, UA RARE (*)    All other components within normal limits  HEPATIC FUNCTION PANEL - Abnormal; Notable for the following components:   Alkaline Phosphatase 234 (*)    All other components within normal limits  RAPID  URINE DRUG SCREEN, HOSP PERFORMED - Abnormal; Notable for the following components:   Opiates POSITIVE (*)    All other components within normal limits  BLOOD GAS, VENOUS - Abnormal; Notable for the following components:   Bicarbonate 29.9 (*)    Acid-Base Excess 4.8 (*)    All other components within normal limits  CBG MONITORING, ED - Abnormal; Notable for the following components:   Glucose-Capillary 165 (*)    All other components within normal limits  CBC WITH DIFFERENTIAL/PLATELET  PROTIME-INR  ETHANOL  TSH  AMMONIA  I-STAT  CG4 LACTIC ACID, ED    Notable for UDS positive for opiates, mild hyperglycemia   Imaging Studies ordered: I ordered imaging studies including CT head On my interpretation imaging demonstrates ICH I independently visualized and interpreted imaging. I agree with the radiologist interpretation   Medicines ordered and prescription drug management: Meds ordered this encounter  Medications   sodium chloride 0.9 % bolus 1,000 mL   levETIRAcetam (KEPPRA) 2,000 mg in sodium chloride 0.9 % 250 mL IVPB   nicardipine (CARDENE) 20mg  in 0.86% saline IV infusion (0.1 mg/ml)   iohexol (OMNIPAQUE) 350 MG/ML injection 75 mL   acetaminophen (OFIRMEV) IV 1,000 mg    Order Specific Question:   Is the patient UNABLE to take oral / enteral medications?    Answer:   Yes    -I have reviewed the patients home medicines and have made adjustments as needed   Consultations Obtained: I requested consultation with the neurologist,  and discussed lab and imaging findings as well as pertinent plan - they recommend: admit to neuro ICU    Cardiac Monitoring: The patient was maintained on a cardiac monitor.  I personally viewed and interpreted the cardiac monitored which showed an underlying rhythm of: sinus tachycardia    Reevaluation: After the interventions noted above, I reevaluated the patient and found that their symptoms have stayed the same  Co morbidities that  complicate the patient evaluation  Past Medical History:  Diagnosis Date   COPD (chronic obstructive pulmonary disease) with emphysema (HCC)    COVID-19 12/09/2019   Hyperlipidemia    Hypertension    Lung cancer, upper lobe (HCC) 12/04/1996   RUL   Osteoporosis    S/P partial lobectomy of lung    [id number]   Thigh shingles 12/04/1996   Thoracic aorta atherosclerosis (HCC)    Vitamin D deficiency       Dispostion: Disposition decision including need for hospitalization was considered, and patient admitted to the hospital.    Final Clinical Impression(s) / ED Diagnoses Final diagnoses:  Nontraumatic intracerebral hemorrhage, unspecified cerebral location, unspecified laterality (HCC)     This chart was dictated using voice recognition software.  Despite best efforts to proofread,  errors can occur which can change the documentation meaning.    Lonell Grandchild, MD 08/06/23 [phone]

## 2023-08-06 NOTE — ED Notes (Signed)
Son at [facility], updated.

## 2023-08-06 NOTE — ED Notes (Signed)
Intermittent s/sx of pain, developing fever. Tylenol given by protocol.

## 2023-08-06 NOTE — Progress Notes (Signed)
Spoke with Dr. Amada Jupiter, MD about patients SBP goals. Patient SBP ranging 110-120s. MD okay with SBP being less than 130 as long as no neuro changes are noted.

## 2023-08-06 NOTE — ED Notes (Signed)
Pt alert, NAD, calm, confused, no changes, to imaging on stretcher, tracking department. Resps e/u.

## 2023-08-06 NOTE — ED Notes (Signed)
Sleeping, frequent yawning, does not follow commands, incomprehensible speech/ mumbling, reacts to BP cuff, localizes pain.

## 2023-08-06 NOTE — ED Triage Notes (Addendum)
BIB [facility] from home, living with family, here s/p sz activity described by family, heard grunting and saw tremors, then became unresponsive. Unresponsive on EMS arrival. Became responsive to voice enroute. Initial SPO2 87% on RA, placed on [place] up to 97%. HR 115, RR 18, BP 170/82. CBG 148, ETCo2 30-35. EKG ST. 18g R AC placed. Family to follow. H/o Lung CA and COPD. No respiratory distress. Incontinent of urine. No sz activity for EMS or on arrival. No oral trauma. Pt alert, awake, fidgety and mumbling, NAD.Not able to comprehend/ follow commands. MAEx4. Left gaze possible.   Addendum: family adds at 10:26: pt broke her R humerus in 2 places in fall 6/7, but also fell recently, not sure if she hit her head.

## 2023-08-06 NOTE — Plan of Care (Signed)
Pt arrived at 1715.  Stroke workup initiated.  EEG in progress.  MRI ordered.  Globally aphasic.  MAE x 4.

## 2023-08-07 ENCOUNTER — Inpatient Hospital Stay (HOSPITAL_COMMUNITY): Payer: Medicare Other

## 2023-08-07 DIAGNOSIS — R569 Unspecified convulsions: Secondary | ICD-10-CM

## 2023-08-07 DIAGNOSIS — I6389 Other cerebral infarction: Secondary | ICD-10-CM

## 2023-08-07 DIAGNOSIS — I611 Nontraumatic intracerebral hemorrhage in hemisphere, cortical: Secondary | ICD-10-CM | POA: Diagnosis not present

## 2023-08-07 LAB — LIPID PANEL
Cholesterol: 231 mg/dL — ABNORMAL HIGH (ref 0–200)
HDL: 47 mg/dL (ref >40)
LDL Cholesterol: 167 mg/dL — ABNORMAL HIGH (ref 0–99)
Total CHOL/HDL Ratio: 4.9 ratio
Triglycerides: 83 mg/dL (ref <150)
VLDL: 17 mg/dL (ref 0–40)

## 2023-08-07 LAB — CBC
HCT: 36.2 % (ref 36.0–46.0)
Hemoglobin: 11.6 g/dL — ABNORMAL LOW (ref 12.0–15.0)
MCH: 27.6 pg (ref 26.0–34.0)
MCHC: 32 g/dL (ref 30.0–36.0)
MCV: 86 fL (ref 80.0–100.0)
Platelets: 156 10*3/uL (ref 150–400)
RBC: 4.21 MIL/uL (ref 3.87–5.11)
RDW: 14.5 % (ref 11.5–15.5)
WBC: 6.7 10*3/uL (ref 4.0–10.5)
nRBC: 0 % (ref 0.0–0.2)

## 2023-08-07 LAB — BASIC METABOLIC PANEL
Anion gap: 10 (ref 5–15)
BUN: 11 mg/dL (ref 8–23)
CO2: 26 mmol/L (ref 22–32)
Calcium: 8.7 mg/dL — ABNORMAL LOW (ref 8.9–10.3)
Chloride: 103 mmol/L (ref 98–111)
Creatinine, Ser: 0.61 mg/dL (ref 0.44–1.00)
GFR, Estimated: >60 mL/min (ref >60)
Glucose, Bld: 118 mg/dL — ABNORMAL HIGH (ref 70–99)
Potassium: 3.1 mmol/L — ABNORMAL LOW (ref 3.5–5.1)
Sodium: 139 mmol/L (ref 135–145)

## 2023-08-07 LAB — HEMOGLOBIN A1C
Hgb A1c MFr Bld: 5.4 % (ref 4.8–5.6)
Mean Plasma Glucose: 108.28 mg/dL

## 2023-08-07 LAB — ECHOCARDIOGRAM COMPLETE
AR max vel: 1.77 cm2
AV Peak grad: 8.3 mmHg
Ao pk vel: 1.44 m/s
Area-P 1/2: 3.53 cm2
Height: 62 in
S' Lateral: 2.7 cm
Weight: 2222.24 [oz_av]

## 2023-08-07 LAB — MAGNESIUM: Magnesium: 1.6 mg/dL — ABNORMAL LOW (ref 1.7–2.4)

## 2023-08-07 MED ORDER — PANTOPRAZOLE SODIUM 40 MG PO TBEC
40.0000 mg | DELAYED_RELEASE_TABLET | Freq: Every day | ORAL | Status: DC
  Administered 2023-08-07 – 2023-08-10 (×4): 40 mg via ORAL
  Filled 2023-08-07 (×4): qty 1

## 2023-08-07 MED ORDER — BUDESON-GLYCOPYRROL-FORMOTEROL 160-9-4.8 MCG/ACT IN AERO: 1.0000 | INHALATION_SPRAY | Freq: Two times a day (BID) | RESPIRATORY_TRACT | Status: DC

## 2023-08-07 MED ORDER — VITAMIN B-12 100 MCG PO TABS
1000.0000 ug | ORAL_TABLET | Freq: Every day | ORAL | Status: DC
  Administered 2023-08-08 – 2023-08-10 (×3): 1000 ug via ORAL
  Filled 2023-08-07 (×4): qty 10

## 2023-08-07 MED ORDER — BUDESONIDE 0.25 MG/2ML IN SUSP
0.2500 mg | Freq: Two times a day (BID) | RESPIRATORY_TRACT | Status: DC
  Administered 2023-08-07 – 2023-08-09 (×4): 0.25 mg via RESPIRATORY_TRACT
  Filled 2023-08-07 (×6): qty 2

## 2023-08-07 MED ORDER — MONTELUKAST SODIUM 10 MG PO TABS
10.0000 mg | ORAL_TABLET | Freq: Every morning | ORAL | Status: DC
  Administered 2023-08-08 – 2023-08-10 (×3): 10 mg via ORAL
  Filled 2023-08-07 (×3): qty 1

## 2023-08-07 MED ORDER — IPRATROPIUM BROMIDE 0.02 % IN SOLN
0.5000 mg | Freq: Four times a day (QID) | RESPIRATORY_TRACT | Status: DC
  Administered 2023-08-07 – 2023-08-08 (×4): 0.5 mg via RESPIRATORY_TRACT
  Filled 2023-08-07 (×4): qty 2.5

## 2023-08-07 MED ORDER — VITAMIN D 25 MCG (1000 UNIT) PO TABS
5000.0000 [IU] | ORAL_TABLET | Freq: Every day | ORAL | Status: DC
  Administered 2023-08-08 – 2023-08-10 (×3): 5000 [IU] via ORAL
  Filled 2023-08-07 (×5): qty 5

## 2023-08-07 MED ORDER — ARFORMOTEROL TARTRATE 15 MCG/2ML IN NEBU
15.0000 ug | INHALATION_SOLUTION | Freq: Two times a day (BID) | RESPIRATORY_TRACT | Status: DC
  Administered 2023-08-07 – 2023-08-09 (×4): 15 ug via RESPIRATORY_TRACT
  Filled 2023-08-07 (×6): qty 2

## 2023-08-07 MED ORDER — BUPROPION HCL 75 MG PO TABS
75.0000 mg | ORAL_TABLET | Freq: Every day | ORAL | Status: DC
  Administered 2023-08-07 – 2023-08-10 (×4): 75 mg via ORAL
  Filled 2023-08-07 (×4): qty 1

## 2023-08-07 MED ORDER — HEPARIN SODIUM (PORCINE) 5000 UNIT/ML IJ SOLN
5000.0000 [IU] | Freq: Two times a day (BID) | INTRAMUSCULAR | Status: DC
  Administered 2023-08-07 – 2023-08-10 (×6): 5000 [IU] via SUBCUTANEOUS
  Filled 2023-08-07 (×6): qty 1

## 2023-08-07 NOTE — Progress Notes (Signed)
Orthopedic Tech Progress Note Patient Details:  Louberta Heldreth Dicenzo 1933/02/26 [id number]  Pt. Relative bring sling from home. Declined new sling Patient ID: Waltina Callens, female   DOB: Jun 03, 1933, 89 y.o.   MRN: [id number]  Donald Pore 08/07/2023, 6:42 PM

## 2023-08-07 NOTE — Evaluation (Signed)
Speech Language Pathology Evaluation Patient Details Name: Penny [name] MRN: [id number] DOB: 10/16/1933 Today's Date: 08/07/2023 Time: 4098-1191 SLP Time Calculation (min) (ACUTE ONLY): 20 min  Problem List:  Patient Active Problem List   Diagnosis Date Noted   ICH (intracerebral hemorrhage) (HCC) 08/06/2023   Hemorrhagic stroke (HCC) 08/06/2023   Intracranial hemorrhage (HCC) 06/01/2023   Closed fracture of left proximal humerus 06/01/2023   Fall at home, initial encounter 06/01/2023   History of COPD 06/01/2023   Allergic rhinitis 06/01/2023   Depression 06/01/2023   Intraparenchymal hematoma of brain (HCC) 06/01/2023   Closed fracture of shaft of left humerus 06/01/2023   Rigidity (muscles) 05/21/2023   Traumatic head injury less than 3 months ago 05/21/2023   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   Malignant neoplasm of upper lobe of lung, unspecified laterality (HCC) 01/07/2023   Syncope    Symptomatic sinus bradycardia 09/10/2022   Hypokalemia 09/10/2022   Hypomagnesemia 09/10/2022   CAD (coronary artery disease) 09/10/2022   COVID-19 (11/30/2021) 12/01/2021   B12 deficiency 06/28/2020   Abnormal glucose 10/09/2018   Osteoporosis 12/28/2015   COPD (chronic obstructive pulmonary disease) with emphysema (HCC) 02/25/2015   Atherosclerosis of aorta (HCC) BY ct SCAN IN 2015 02/25/2015   Essential hypertension    Hyperlipidemia, mixed    Vitamin D deficiency    Personal history of lung cancer 12/04/1996   Past Medical History:  Past Medical History:  Diagnosis Date   COPD (chronic obstructive pulmonary disease) with emphysema (HCC)    COVID-19 12/09/2019   Hyperlipidemia    Hypertension    Lung cancer, upper lobe (HCC) 12/04/1996   RUL   Osteoporosis    S/P partial lobectomy of lung    RUL1998   Thigh shingles 12/04/1996   Thoracic aorta atherosclerosis (HCC)    Vitamin D deficiency    Past Surgical History:  Past Surgical History:  Procedure Laterality  Date   CARPAL TUNNEL RELEASE Right    2009   LUNG REMOVAL, PARTIAL Right    RUL 1998   OLECRANON BURSECTOMY Left 03/20/2017   Procedure: EXICISION OLECRANON BURSA LEFT ELBOW;  Surgeon: Cindee Salt, MD;  Location: [facility];  Service: Orthopedics;  Laterality: Left;  with scb block in preop   SHOULDER ADHESION RELEASE Right    2004   HPI:  Penny [name] is an 89 yo female presenting to ED 9/2 after having witnessed seizure-like activity and a period of unresponsiveness and urine incontinence. CTH 9/2 positive for [place] acute intra-axial hemorrhage in L temporal lobe. Admitted 6/28 following a fall with CTH remarkable for punctate focus of acute IPH in R frontal cortex as well as a L humerus fx. Per daughter, pt has had gradual decline in memory. PMH includes COPD, HLD, HTN   Assessment / Plan / Recommendation Clinical Impression  Pt's daughter present in room throughout the session, who states that pt lives with her and has been experiencing gradual declines in memory. Per pt's daughter, pt was working part time until December 2023 and has since had multiple falls and requires increased assistance. Pt reports difficulty with word finding since being admitted. MD rounding during session, during which pt was perseverative on her age, stating "80" when asked to name the month, year, and various objects around the room. SLP administered portions of the Bedside Western Aphasia Battery with noted deficits related to repetition, confrontation naming, and auditory comprehension. She required increased wait time and cueing before repeating at the word and phrase  level. At the sentence level, pt with phonemic and semantic paraphasias. Pt became perseverative during the confrontation naming portion of the evaluation, although note correctly named ~60% of objects. She required frequent repetition during the more complex yes/no question portion and then became perseverative. Overall, pt exhibits signs  of expressive and receptive aphasia with more complex cognitive linguistic tasks. Suspect she will progress given intensive SLP f/u. Will continue to follow.    SLP Assessment  SLP Recommendation/Assessment: Patient needs continued Speech Lanaguage Pathology Services SLP Visit Diagnosis: Aphasia (R47.01);Cognitive communication deficit (R41.841)    Recommendations for follow up therapy are one component of a multi-disciplinary discharge planning process, led by the attending physician.  Recommendations may be updated based on patient status, additional functional criteria and insurance authorization.    Follow Up Recommendations  Acute inpatient rehab (3hours/day)    Assistance Recommended at Discharge  Frequent or constant Supervision/Assistance  Functional Status Assessment Patient has had a recent decline in their functional status and demonstrates the ability to make significant improvements in function in a reasonable and predictable amount of time.  Frequency and Duration min 2x/week  2 weeks      SLP Evaluation Cognition  Overall Cognitive Status: Impaired/Different from baseline Arousal/Alertness: Awake/alert Orientation Level: Oriented to person;Oriented to place;Disoriented to time;Disoriented to situation Attention: Sustained Sustained Attention: Impaired Sustained Attention Impairment: Verbal basic;Functional basic Awareness: Impaired Awareness Impairment: Intellectual impairment       Comprehension  Auditory Comprehension Overall Auditory Comprehension: Appears within functional limits for tasks assessed    Expression Verbal Expression Overall Verbal Expression: Impaired Initiation: No impairment Repetition: Impaired Level of Impairment: Sentence level Naming: Impairment Confrontation: Impaired Verbal Errors: Phonemic paraphasias Written Expression Dominant Hand: Right   Oral / Motor  Oral Motor/Sensory Function Overall Oral Motor/Sensory Function: Within  functional limits Motor Speech Overall Motor Speech: Appears within functional limits for tasks assessed            Gwynneth Aliment, M.A., CF-SLP Speech Language Pathology, Acute Rehabilitation Services  Secure Chat preferred [phone]  08/07/2023, 9:57 AM

## 2023-08-07 NOTE — Progress Notes (Signed)
OT Cancellation Note  Patient Details Name: Roxene Sniff MRN: [id number] DOB: December 03, 1933   Cancelled Treatment:    Reason Eval/Treat Not Completed: Active bedrest order Per order on bedrest for 24 hours starting 08/06/2023 at 17:17.  Lindon Romp OT Acute Rehabilitation Services Office [phone]    Evette Georges 08/07/2023, 5:38 AM

## 2023-08-07 NOTE — Progress Notes (Signed)
No urine output charted since arrival to unit at 17:00. Bladder scan read . Notified Dr. Amada Jupiter at 01:25. Verbal order for I&O once.

## 2023-08-07 NOTE — Evaluation (Signed)
Physical Therapy Evaluation Patient Details Name: Penny [name] MRN: [id number] DOB: 10-27-1933 Today's Date: 08/07/2023  History of Present Illness  Penny [name] is an 89 yo female presenting to ED 9/2 after having witnessed seizure-like activity and a period of unresponsiveness and urine incontinence. CTH 9/2 positive for [place] acute intra-axial hemorrhage in Lt temporal lobe. Admitted 6/28 following a fall with CTH remarkable for punctate focus of acute IPH in Rt frontal cortex as well as a Lt humerus fx. Per ortho f/u 8/5 plan to remain NWB on Lt UE in splint and with sling in place to minimize shoulder mobility. PMH includes COPD, HLD, HTN.   Clinical Impression  Penny [name] is 89 y.o. female admitted with above HPI and diagnosis. Patient is currently limited by functional impairments below (see PT problem list). Patient lives with daughter per chart review and is has been limited with mobility since fall and admission on 06/01/23 resulting in ICH and Lt humeral fracture. Pt unable to provide CLOF since last hospital admission/discharge. Currently she requires Max assist for bed mobility with cues needed to maintain NWB on Lt UE; once EOB pt able to maintain balance with CGA for safety. Pt required Mod assist for sit<>stand transfers and to weight shift to move bed>chair with Rt HHA. Pt with significant posterior lean and cues to activate gluteals improved posture along with manual facilitation of anterior weight shift. Patient will benefit from continued skilled PT interventions to address impairments and progress independence with mobility. Patient will benefit from continued inpatient follow up therapy, <3 hours/day. Acute PT will follow and progress as able.         If plan is discharge home, recommend the following: A lot of help with walking and/or transfers;A lot of help with bathing/dressing/bathroom;Assistance with cooking/housework;Direct supervision/assist for medications  management;Assist for transportation;Help with stairs or ramp for entrance;Supervision due to cognitive status   Can travel by private vehicle   No    Equipment Recommendations Other (comment) (defer to next venue)  Recommendations for Other Services       Functional Status Assessment Patient has had a recent decline in their functional status and demonstrates the ability to make significant improvements in function in a reasonable and predictable amount of time.     Precautions / Restrictions Precautions Precautions: Fall Required Braces or Orthoses: Sling Restrictions Weight Bearing Restrictions: Yes LUE Weight Bearing: Non weight bearing Other Position/Activity Restrictions: Lt UE in sling when not in confined/supported rest position      Mobility  Bed Mobility Overal bed mobility: Needs Assistance Bed Mobility: Supine to Sit     Supine to sit: HOB elevated, Max assist     General bed mobility comments: Max cues for sequencing bringing LE's off EOB and Max assist to raise trunk and pivot hips with use of bed pad. cues to maintain NWB on Lt UE with supine>sit. Pt able to maintain seated balance EOB with min assist fading to CGA.    Transfers Overall transfer level: Needs assistance Equipment used: 1 person hand held assist Transfers: Sit to/from Stand, Bed to chair/wheelchair/BSC Sit to Stand: Mod assist   Step pivot transfers: Mod assist       General transfer comment: Mod assist for power up with cues to avoid use of Lt UE. Rt HHA to steady. pt with strong posterior lean on rise and manual cue to facilitate anterior lean at hips/trunk to improve balance. Mod assist to weight shift and pt slidding feet along floor  to move bed>chair.    Ambulation/Gait                  Stairs            Wheelchair Mobility     Tilt Bed    Modified Rankin (Stroke Patients Only)       Balance                                              Pertinent Vitals/Pain Pain Assessment Pain Assessment: Faces Faces Pain Scale: Hurts a little bit Pain Location: Lt elbow/arm Pain Descriptors / Indicators: Aching, Discomfort Pain Intervention(s): Limited activity within patient's tolerance, Monitored during session, Repositioned    Home Living Family/patient expects to be discharged to:: Private residence Living Arrangements: Children Available Help at Discharge: Family Type of Home: House Home Access: Stairs to enter Entrance Stairs-Rails: None Entrance Stairs-Number of Steps: 1   Home Layout: One level Home Equipment: Agricultural consultant (2 wheels);Shower seat Additional Comments: pt unreliable historian and attempted to contact pt's duaghter however no answer to number in chart. all home set up/environment from prior admission.    Prior Function Prior Level of Function : Independent/Modified Independent             Mobility Comments: pt aware of frequent falls, unable to state if uses AD ADLs Comments: pt aware of frequent falls, unable to state baseline.     Extremity/Trunk Assessment   Upper Extremity Assessment Upper Extremity Assessment: Defer to OT evaluation;LUE deficits/detail LUE Deficits / Details: Per Ortho f/u on 07/09/23 "Would like to see her back in 1 month for repeat x-rays and examination we will take the splint off at that time...continue with the sling and we will see her back in 1 month for repeat radiographs and removal of her splint.    Lower Extremity Assessment Lower Extremity Assessment: Generalized weakness;LLE deficits/detail;RLE deficits/detail RLE Deficits / Details: grossly 3+/5 throughout LE's RLE Coordination: decreased gross motor;decreased fine motor LLE Coordination: decreased gross motor;decreased fine motor    Cervical / Trunk Assessment Cervical / Trunk Assessment: Kyphotic  Communication   Communication Communication: Difficulty following commands/understanding Following  commands: Follows one step commands with increased time;Follows one step commands inconsistently Cueing Techniques: Verbal cues;Tactile cues;Visual cues  Cognition Arousal: Alert Behavior During Therapy: WFL for tasks assessed/performed Overall Cognitive Status: Impaired/Different from baseline Area of Impairment: Attention, Memory, Following commands, Safety/judgement, Awareness, Problem solving, Orientation                 Orientation Level: Disoriented to, Place, Situation, Time Current Attention Level: Focused Memory: Decreased recall of precautions, Decreased short-term memory Following Commands: Follows one step commands with increased time, Follows one step commands inconsistently Safety/Judgement: Decreased awareness of safety, Decreased awareness of deficits Awareness: Intellectual Problem Solving: Slow processing, Decreased initiation, Difficulty sequencing, Requires verbal cues, Requires tactile cues General Comments: pt answering simple questions with extra time and when answered correctly she often would use that answer to answer the next question. pt able to read calender but unable to recognize which word was the day of week or month or what number was the year.        General Comments      Exercises     Assessment/Plan    PT Assessment Patient needs continued PT services  PT Problem List Decreased strength;Decreased range of motion;Decreased  activity tolerance;Decreased balance;Decreased mobility;Decreased coordination;Decreased cognition;Decreased knowledge of use of DME;Decreased safety awareness;Decreased knowledge of precautions;Pain       PT Treatment Interventions DME instruction;Gait training;Stair training;Functional mobility training;Therapeutic activities;Therapeutic exercise;Balance training;Neuromuscular re-education;Cognitive remediation;Patient/family education;Wheelchair mobility training    PT Goals (Current goals can be found in the Care Plan  section)  Acute Rehab PT Goals Patient Stated Goal: pt unable to state PT Goal Formulation: Patient unable to participate in goal setting Time For Goal Achievement: 08/21/23 Potential to Achieve Goals: Fair    Frequency Min 1X/week     Co-evaluation               AM-PAC PT "6 Clicks" Mobility  Outcome Measure Help needed turning from your back to your side while in a flat bed without using bedrails?: A Lot Help needed moving from lying on your back to sitting on the side of a flat bed without using bedrails?: A Lot Help needed moving to and from a bed to a chair (including a wheelchair)?: A Lot Help needed standing up from a chair using your arms (e.g., wheelchair or bedside chair)?: A Lot Help needed to walk in hospital room?: A Lot Help needed climbing 3-5 steps with a railing? : Total 6 Click Score: 11    End of Session Equipment Utilized During Treatment: Gait belt Activity Tolerance: Patient tolerated treatment well Patient left: in chair;with call bell/phone within reach;with chair alarm set Nurse Communication: Mobility status PT Visit Diagnosis: Unsteadiness on feet (R26.81);Muscle weakness (generalized) (M62.81);Difficulty in walking, not elsewhere classified (R26.2);Other symptoms and signs involving the nervous system (R29.898);Pain;History of falling (Z91.81);Other abnormalities of gait and mobility (R26.89);Repeated falls (R29.6) Pain - Right/Left: Left Pain - part of body: Arm    Time: 5621-3086 PT Time Calculation (min) (ACUTE ONLY): 30 min   Charges:   PT Evaluation $PT Eval Moderate Complexity: 1 Mod PT Treatments $Therapeutic Activity: 8-22 mins PT General Charges $$ ACUTE PT VISIT: 1 Visit         Wynn Maudlin, DPT Acute Rehabilitation Services Office [phone]  08/07/23 4:52 PM

## 2023-08-07 NOTE — Progress Notes (Signed)
LTM maint complete - no skin breakdown under: F8 , A2

## 2023-08-07 NOTE — Progress Notes (Signed)
Report given to 3W RN.  Called pt's daughter, Adin Hector, and let her know of pt's movement and new bed number.

## 2023-08-07 NOTE — Evaluation (Signed)
Clinical/Bedside Swallow Evaluation Patient Details  Name: Penny [name] MRN: [id number] Date of Birth: [birth year]  Today's Date: 08/07/2023 Time: SLP Start Time (ACUTE ONLY): 0855 SLP Stop Time (ACUTE ONLY): 0918 SLP Time Calculation (min) (ACUTE ONLY): 23 min  Past Medical History:  Past Medical History:  Diagnosis Date   COPD (chronic obstructive pulmonary disease) with emphysema (HCC)    COVID-19 12/09/2019   Hyperlipidemia    Hypertension    Lung cancer, upper lobe (HCC) 12/04/1996   RUL   Osteoporosis    S/P partial lobectomy of lung    RUL1998   Thigh shingles 12/04/1996   Thoracic aorta atherosclerosis (HCC)    Vitamin D deficiency    Past Surgical History:  Past Surgical History:  Procedure Laterality Date   CARPAL TUNNEL RELEASE Right    2009   LUNG REMOVAL, PARTIAL Right    RUL 1998   OLECRANON BURSECTOMY Left 03/20/2017   Procedure: EXICISION OLECRANON BURSA LEFT ELBOW;  Surgeon: Cindee Salt, MD;  Location: [facility];  Service: Orthopedics;  Laterality: Left;  with scb block in preop   SHOULDER ADHESION RELEASE Right    2004   HPI:  Penny [name] is an 89 yo female presenting to ED 9/2 after having witnessed seizure-like activity and a period of unresponsiveness and urine incontinence. CTH 9/2 positive for Sardo acute intra-axial hemorrhage in L temporal lobe. Admitted 6/28 following a fall with CTH remarkable for punctate focus of acute IPH in R frontal cortex as well as a L humerus fx. Per RN note, pt globally aphasic. PMH includes COPD, HLD, HTN    Assessment / Plan / Recommendation  Clinical Impression  Pt reports no prior difficulty swallowing. Oral motor exam WFL. Pt currently has a cast on her LUE and appears to have increased RUE weakness, which impacts her ability to self feed. Observed pt with trials of thin liquids, purees, and solids with no overt s/s of aspiration. Pt with decreased awareness of solid bolus presentations, although able  to thoroughly masticate if given assistance with feeding. Overall, pt exhibits a functional ability to swallow. Recommend initiating diet of Dys 3 textures and thin liquids with great potential to upgrade as mentation improves. She requires full supervision and assistance with feeding. Will continue to follow. SLP Visit Diagnosis: Dysphagia, unspecified (R13.10)    Aspiration Risk  Mild aspiration risk    Diet Recommendation Dysphagia 3 (Mech soft);Thin liquid    Liquid Administration via: Cup;Straw Medication Administration: Whole meds with puree Supervision: Staff to assist with self feeding;Full supervision/cueing for compensatory strategies Compensations: Minimize environmental distractions;Slow rate;Muratore sips/bites Postural Changes: Seated upright at 90 degrees    Other  Recommendations Oral Care Recommendations: Oral care BID    Recommendations for follow up therapy are one component of a multi-disciplinary discharge planning process, led by the attending physician.  Recommendations may be updated based on patient status, additional functional criteria and insurance authorization.  Follow up Recommendations Acute inpatient rehab (3hours/day)      Assistance Recommended at Discharge    Functional Status Assessment Patient has had a recent decline in their functional status and demonstrates the ability to make significant improvements in function in a reasonable and predictable amount of time.  Frequency and Duration min 2x/week  2 weeks       Prognosis Prognosis for improved oropharyngeal function: Good Barriers to Reach Goals: Cognitive deficits;Language deficits      Swallow Study   General HPI: Penny [name] is  an 89 yo female presenting to ED 9/2 after having witnessed seizure-like activity and a period of unresponsiveness and urine incontinence. CTH 9/2 positive for Zaborowski acute intra-axial hemorrhage in L temporal lobe. Admitted 6/28 following a fall with CTH remarkable  for punctate focus of acute IPH in R frontal cortex as well as a L humerus fx. Per RN note, pt globally aphasic. PMH includes COPD, HLD, HTN Type of Study: Bedside Swallow Evaluation Previous Swallow Assessment: none in chart Diet Prior to this Study: NPO Temperature Spikes Noted: No Respiratory Status: Nasal cannula History of Recent Intubation: No Behavior/Cognition: Alert;Cooperative;Requires cueing Oral Cavity Assessment: Within Functional Limits Oral Care Completed by SLP: No Oral Cavity - Dentition: Adequate natural dentition Vision: Functional for self-feeding Self-Feeding Abilities: Needs assist Patient Positioning: Upright in bed Baseline Vocal Quality: Normal Volitional Cough: Strong Volitional Swallow: Able to elicit    Oral/Motor/Sensory Function Overall Oral Motor/Sensory Function: Within functional limits   Ice Chips Ice chips: Not tested   Thin Liquid Thin Liquid: Within functional limits Presentation: Straw    Nectar Thick Nectar Thick Liquid: Not tested   Honey Thick Honey Thick Liquid: Not tested   Puree Puree: Within functional limits Presentation: Spoon   Solid     Solid: Impaired Oral Phase Impairments: Poor awareness of bolus      Gwynneth Aliment, M.A., CF-SLP Speech Language Pathology, Acute Rehabilitation Services  Secure Chat preferred [phone]  08/07/2023,9:47 AM

## 2023-08-07 NOTE — Progress Notes (Signed)
Echocardiogram 2D Echocardiogram has been performed.  Lucendia Herrlich 08/07/2023, 8:38 AM

## 2023-08-07 NOTE — Progress Notes (Addendum)
STROKE TEAM PROGRESS NOTE   BRIEF HPI [name] is a 89 y.o. female with history of COPD, HTN, HLD t/w aspirin presenting with witnessed seizure-like activity (+incontinence of bladder, +post-ictal state). Previously on metoprolol and hydrochlorothiazide for HTN, d/c'd in setting of bradycardia. CTH showed [place] ICH in left temporal lobe w/ edema, w/o mass effect, likely nidus. In the past, patient had fallen at work (December 2023) with no LOC, -- head CT workup showed acute IPH in right frontal cortex.   SIGNIFICANT HOSPITAL EVENTS 9/2: CTH shows L temporal hemorrhage, confirmed with MRI. Placed on IV Keppra prophylaxis. BPs stable.   9/3: EEG negative. Speech: +dysphagia, placed on dysphagia 3 diet. Needs SLP follow-up.  INTERIM HISTORY/SUBJECTIVE  NAEON. No PRNs. Did not need scheduled labetalol/senna.  On interview with daughter in room, daughter said she was woken up by her mom's gasping. Noted stiff breathing and unfocused eyes, at which point she called 9/11. Even after symptoms resided, noted continued labored breathing, resolved on O2, concerning for post-ictal state. Larey Seat in June -- noted Penny [name] ICH. No trouble taking medications -- off metoprolol for blood pressures in 30s. Notes: +cognitive impariment, +memory issues, +confusion earlier Sunday with language-forming difficulties. Dishner vessel disease. No history of seizure. 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   OBJECTIVE  CBC    Component Value Date/Time   WBC 8.7 08/06/2023 0946   RBC 4.68 08/06/2023 0946   HGB 12.9 08/06/2023 0946   HCT 40.4 08/06/2023 0946   PLT 165 08/06/2023 0946   MCV 86.3 08/06/2023 0946   MCH 27.6 08/06/2023 0946   MCHC 31.9 08/06/2023 0946   RDW 14.6 08/06/2023 0946   LYMPHSABS 0.7 08/06/2023 0946   MONOABS 0.8 08/06/2023 0946   EOSABS 0.0 08/06/2023 0946   BASOSABS 0.0 08/06/2023 0946    BMET    Component Value Date/Time    NA 138 08/06/2023 0946   K 3.3 (L) 08/06/2023 0946   CL 102 08/06/2023 0946   CO2 25 08/06/2023 0946   GLUCOSE 171 (H) 08/06/2023 0946   BUN 15 08/06/2023 0946   CREATININE 0.67 08/06/2023 0946   CREATININE 0.69 07/05/2023 1605   CALCIUM 8.9 08/06/2023 0946   EGFR 83 07/05/2023 1605   GFRNONAA >60 08/06/2023 0946   GFRNONAA 59 (L) 02/07/2021 1450    IMAGING past 24 hours ECHOCARDIOGRAM COMPLETE  Result Date: 08/07/2023    ECHOCARDIOGRAM REPORT   Patient Name:   Penny [name] Date of Exam: 08/07/2023 Medical Rec #:  [id number]            Height:       62.0 in Accession #:    [id number]           Weight:       138.9 lb Date of Birth:  11/08/1933             BSA:          1.637 m Patient Age:    89 years             BP:           130/63 mmHg Patient Gender: F                    HR:           82  bpm. Exam Location:  Inpatient Procedure: 2D Echo, Cardiac Doppler and Color Doppler Indications:    Stroke  I63.9  History:        Patient has prior history of Echocardiogram examinations, most                 recent 09/11/2022. CAD, COPD, Arrythmias:Bradycardia,                 Signs/Symptoms:Syncope; Risk Factors:Hypertension and                 Dyslipidemia.  Sonographer:    Lucendia Herrlich Referring Phys: [id number] ASHISH ARORA IMPRESSIONS  1. Left ventricular ejection fraction, by estimation, is 60 to 65%. The left ventricle has normal function. The left ventricle has no regional wall motion abnormalities. Left ventricular diastolic parameters are consistent with Grade I diastolic dysfunction (impaired relaxation). Elevated left atrial pressure.  2. Right ventricular systolic function is normal. The right ventricular size is normal. There is mildly elevated pulmonary artery systolic pressure. The estimated right ventricular systolic pressure is 41.8 mmHg.  3. The mitral valve is normal in structure. Trivial mitral valve regurgitation. No evidence of mitral stenosis.  4. The aortic valve is tricuspid. Aortic  valve regurgitation is not visualized. No aortic stenosis is present.  5. The inferior vena cava is dilated in size with <50% respiratory variability, suggesting right atrial pressure of 15 mmHg. Comparison(s): Prior images reviewed side by side. There is now evidence of hypervolemia. FINDINGS  Left Ventricle: Left ventricular ejection fraction, by estimation, is 60 to 65%. The left ventricle has normal function. The left ventricle has no regional wall motion abnormalities. The left ventricular internal cavity size was normal in size. There is  no left ventricular hypertrophy. Left ventricular diastolic parameters are consistent with Grade I diastolic dysfunction (impaired relaxation). Elevated left atrial pressure. Right Ventricle: The right ventricular size is normal. No increase in right ventricular wall thickness. Right ventricular systolic function is normal. There is mildly elevated pulmonary artery systolic pressure. The tricuspid regurgitant velocity is 2.59  m/s, and with an assumed right atrial pressure of 15 mmHg, the estimated right ventricular systolic pressure is 41.8 mmHg. Left Atrium: Left atrial size was normal in size. Right Atrium: Right atrial size was normal in size. Pericardium: There is no evidence of pericardial effusion. Mitral Valve: The mitral valve is normal in structure. Trivial mitral valve regurgitation. No evidence of mitral valve stenosis. Tricuspid Valve: The tricuspid valve is normal in structure. Tricuspid valve regurgitation is trivial. No evidence of tricuspid stenosis. Aortic Valve: The aortic valve is tricuspid. Aortic valve regurgitation is not visualized. No aortic stenosis is present. Aortic valve peak gradient measures 8.3 mmHg. Pulmonic Valve: The pulmonic valve was grossly normal. Pulmonic valve regurgitation is not visualized. No evidence of pulmonic stenosis. Aorta: The aortic root and ascending aorta are structurally normal, with no evidence of dilitation. Venous: The  inferior vena cava is dilated in size with less than 50% respiratory variability, suggesting right atrial pressure of 15 mmHg. IAS/Shunts: No atrial level shunt detected by color flow Doppler.  LEFT VENTRICLE PLAX 2D LVIDd:         4.10 cm   Diastology LVIDs:         2.70 cm   LV e' medial:    4.68 cm/s LV PW:         0.90 cm   LV E/e' medial:  17.6 LV IVS:        0.90 cm   LV e' lateral:   6.09 cm/s LVOT diam:     1.90 cm   LV  E/e' lateral: 13.5 LV SV:         44 LV SV Index:   27 LVOT Area:     2.84 cm  RIGHT VENTRICLE            IVC RV S prime:     9.88 cm/s  IVC diam: 2.20 cm TAPSE (M-mode): 1.6 cm LEFT ATRIUM             Index        RIGHT ATRIUM          Index LA diam:        2.80 cm 1.71 cm/m   RA Area:     8.60 cm LA Vol (A2C):   30.6 ml 18.69 ml/m  RA Volume:   14.50 ml 8.86 ml/m LA Vol (A4C):   42.7 ml 26.08 ml/m LA Biplane Vol: 36.2 ml 22.11 ml/m  AORTIC VALVE AV Area (Vmax): 1.77 cm AV Vmax:        144.00 cm/s AV Peak Grad:   8.3 mmHg LVOT Vmax:      90.10 cm/s LVOT Vmean:     58.533 cm/s LVOT VTI:       0.156 m  AORTA Ao Root diam: 3.20 cm Ao Asc diam:  3.00 cm MITRAL VALVE                TRICUSPID VALVE MV Area (PHT): 3.53 cm     TR Peak grad:   26.8 mmHg MV Decel Time: 215 msec     TR Vmax:        259.00 cm/s MV E velocity: 82.40 cm/s MV A velocity: 116.00 cm/s  SHUNTS MV E/A ratio:  0.71         Systemic VTI:  0.16 m                             Systemic Diam: 1.90 cm Thurmon Fair MD Electronically signed by Thurmon Fair MD Signature Date/Time: 08/07/2023/10:06:59 AM    Final    Overnight EEG with video  Result Date: 08/07/2023 Charlsie Quest, MD     08/07/2023  9:00 AM Patient Name: Samar Cleaton MRN: [id number] Epilepsy Attending: Charlsie Quest Referring Physician/Provider: Milon Dikes, MD Duration: 08/06/2023 1756 to 08/07/2023 0900 Patient history: 89 y.o. female with a past medical history of COPD, hyperlipidemia, hypertension presenting to the emergency department with  altered mental status. Patient had seizure like activity witnessed by family, EMS called and she went unresponsive.  EEG to evaluate for seizure Level of alertness: Awake, asleep AEDs during EEG study: LEV Technical aspects: This EEG study was done with scalp electrodes positioned according to the 10-20 International system of electrode placement. Electrical activity was reviewed with band pass filter of 1-70Hz , sensitivity of 7 uV/mm, display speed of 51mm/sec with a 60Hz  notched filter applied as appropriate. EEG data were recorded continuously and digitally stored.  Video monitoring was available and reviewed as appropriate. Description: The posterior dominant rhythm consists of 8 Hz activity of moderate voltage (25-35 uV) seen predominantly in posterior head regions, asymmetric ( left<right) and reactive to eye opening and eye closing. Sleep was characterized by vertex waves, sleep spindles (12 to 14 Hz), maximal frontocentral region.  EEG showed continuous generalized and maximal left temporal 3 to 6 Hz theta-delta slowing, at times with triphasic morphology. Hyperventilation and photic stimulation were not performed.   EEG was disconnected between 08/06/2023 2042 to 2128. ABNORMALITY -  Continuous slow, generalized and maximal left temporal - Background asymmetry, left<right IMPRESSION: This study is suggestive of cortical dysfunction arising from left temporal region likely secondary to underlying structural abnormality/ bleed. Additionally there is moderate diffuse encephalopathy. No seizures or epileptiform discharges were seen throughout the recording. Charlsie Quest   MR BRAIN W WO CONTRAST  Result Date: 08/07/2023 CLINICAL DATA:  Follow-up examination for hemorrhagic stroke. EXAM: MRI HEAD WITHOUT AND WITH CONTRAST TECHNIQUE: Multiplanar, multiecho pulse sequences of the brain and surrounding structures were obtained without and with intravenous contrast. CONTRAST:  6mL GADAVIST GADOBUTROL 1 MMOL/ML IV  SOLN COMPARISON:  Comparison made with multiple CTs performed earlier the same day as well as previous MRI from 06/25/2023 FINDINGS: Brain: Relatively mild cerebral atrophy for age. Extensive confluent T2/FLAIR hyperintensity involving the periventricular and deep white matter both cerebral hemispheres, deep gray nuclei, and pons, most likely related to severe chronic microvascular ischemic disease. Appearance is similar to previous. Previously identified intraparenchymal hemorrhage positioned at the left temporal lobe again seen, relatively stable in size and distribution measuring 2.7 x 1.4 x 1.0 cm (estimated volume 2 mL). Surrounding vasogenic edema without significant regional mass effect. No underlying mass or abnormal enhancement seen at this location. No other evidence for acute or subacute ischemia. Gray-white matter differentiation otherwise maintained. Chronic siderosis with a few parenchymal micro hemorrhages clustered about the high posterior right frontoparietal region, consistent with prior hemorrhage at this location (series 8, image 53). This is similar to previous. Few Daino chronic micro hemorrhages about the cerebellum and right frontal lobe, stable, and likely hypertensive in nature. There is a new chronic microhemorrhage at the left occipital lobe (series 8, image 33). No mass lesion or midline shift. Ventricular prominence related to global parenchymal volume loss without hydrocephalus. No significant extra-axial fluid collection. Pituitary gland and suprasellar region within normal limits. No abnormal enhancement. Vascular: Major intracranial vascular flow voids are maintained. Skull and upper cervical spine: Craniocervical junction within normal limits. Bone marrow signal intensity normal. No visible scalp soft tissue abnormality. Sinuses/Orbits: Prior bilateral ocular lens replacement. Globes and orbital soft tissues otherwise unremarkable. Paranasal sinuses are largely clear. No  significant mastoid effusion. Other: None. IMPRESSION: 1. 2.7 x 1.4 x 1.0 cm intraparenchymal hemorrhage at the left temporal lobe, stable from prior. Surrounding vasogenic edema without significant regional mass effect. No underlying mass or abnormal enhancement seen at this location. 2. No other acute intracranial abnormality. 3. Chronic siderosis with a few chronic parenchymal micro hemorrhages clustered about the high posterior right frontoparietal region, consistent with prior hemorrhage at this location. 4. Single new chronic microhemorrhage at the left occipital lobe as above. 5. Underlying mild cerebral atrophy with severe chronic microvascular ischemic disease, similar to previous. Electronically Signed   By: Rise Mu M.D.   On: 08/07/2023 01:00   CT ANGIO HEAD NECK W WO CM  Result Date: 08/06/2023 CLINICAL DATA:  Neuro deficit, acute, stroke suspected; Stroke, hemorrhagic. EXAM: CT ANGIOGRAPHY HEAD AND NECK WITH AND WITHOUT CONTRAST CT VENOGRAM TECHNIQUE: Multidetector CT imaging of the head and neck was performed using the standard protocol during bolus administration of intravenous contrast. Multiplanar CT image reconstructions and MIPs were obtained to evaluate the vascular anatomy. Carotid stenosis measurements (when applicable) are obtained utilizing NASCET criteria, using the distal internal carotid diameter as the denominator. Venographic phase images of the brain were obtained following the administration of intravenous contrast. Multiplanar reformats and maximum intensity projections were generated. RADIATION DOSE REDUCTION: This exam was performed according  to the departmental dose-optimization program which includes automated exposure control, adjustment of the mA and/or kV according to patient size and/or use of iterative reconstruction technique. CONTRAST:  75mL OMNIPAQUE IOHEXOL 350 MG/ML SOLN COMPARISON:  CT head from the same day. FINDINGS: CTA NECK FINDINGS Aortic arch:  Great vessel origins are patent.  Atherosclerosis. Right carotid system: No evidence of dissection, stenosis (50% or greater), or occlusion. Left carotid system: No evidence of dissection, stenosis (50% or greater), or occlusion. Vertebral arteries: Left dominant. No evidence of dissection, stenosis (50% or greater), or occlusion. Skeleton: Further evaluated on dedicated concurrent CT of the cervical spine. Other neck: No acute abnormality on limited assessment. Subcentimeter thyroid nodule does not require further imaging follow-up (ref: J Am Coll Radiol. 2015 Feb;12(2): 143-50). Upper chest: Emphysema. Review of the MIP images confirms the above findings CTA HEAD FINDINGS Anterior circulation: Hypoplastic right A1 ACA. Otherwise, bilateral intracranial ICAs, MCAs and ACAs are patent without proximal hemodynamically significant stenosis. 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 Posterior circulation: Bilateral intradural vertebral arteries, basilar artery and bilateral posterior cerebral arteries are patent without proximal hemodynamically significant stenosis. Anatomic variants: Detailed above. Review of the MIP images confirms the above findings CT VENOGRAM FINDINGS No evidence of dural venous sinus thrombosis. Specifically, the superior sagittal, sigmoid, transverse, straight, and visualized deep cerebral veins are patent. [place] left transverse sinus. IMPRESSION: 1. No emergent large vessel occlusion or proximal hemodynamically significant stenosis. 2. No evidence of vascular malformation or aneurysm in the region of left temporal hemorrhage, although acute blood products limits assessment. 3. No dural venous sinus thrombosis. 4. 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 This is not in the region of acute hemorrhage. 5. Aortic Atherosclerosis (ICD10-I70.0) and  Emphysema (ICD10-J43.9). Electronically Signed   By: Feliberto Harts M.D.   On: 08/06/2023 13:37   CT VENOGRAM HEAD  Result Date: 08/06/2023 CLINICAL DATA:  Neuro deficit, acute, stroke suspected; Stroke, hemorrhagic. EXAM: CT ANGIOGRAPHY HEAD AND NECK WITH AND WITHOUT CONTRAST CT VENOGRAM TECHNIQUE: Multidetector CT imaging of the head and neck was performed using the standard protocol during bolus administration of intravenous contrast. Multiplanar CT image reconstructions and MIPs were obtained to evaluate the vascular anatomy. Carotid stenosis measurements (when applicable) are obtained utilizing NASCET criteria, using the distal internal carotid diameter as the denominator. Venographic phase images of the brain were obtained following the administration of intravenous contrast. Multiplanar reformats and maximum intensity projections were generated. RADIATION DOSE REDUCTION: This exam was performed according to the departmental dose-optimization program which includes automated exposure control, adjustment of the mA and/or kV according to patient size and/or use of iterative reconstruction technique. CONTRAST:  75mL OMNIPAQUE IOHEXOL 350 MG/ML SOLN COMPARISON:  CT head from the same day. FINDINGS: CTA NECK FINDINGS Aortic arch: Great vessel origins are patent.  Atherosclerosis. Right carotid system: No evidence of dissection, stenosis (50% or greater), or occlusion. Left carotid system: No evidence of dissection, stenosis (50% or greater), or occlusion. Vertebral arteries: Left dominant. No evidence of dissection, stenosis (50% or greater), or occlusion. Skeleton: Further evaluated on dedicated concurrent CT of the cervical spine. Other neck: No acute abnormality on limited assessment. Subcentimeter thyroid nodule does not require further imaging follow-up (ref: J Am Coll Radiol. 2015 Feb;12(2): 143-50). Upper chest: Emphysema. Review of the MIP images confirms the above findings CTA HEAD FINDINGS Anterior  circulation: Hypoplastic right A1 ACA. Otherwise,  bilateral intracranial ICAs, MCAs and ACAs are patent without proximal hemodynamically significant stenosis. 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 Posterior circulation: Bilateral intradural vertebral arteries, basilar artery and bilateral posterior cerebral arteries are patent without proximal hemodynamically significant stenosis. Anatomic variants: Detailed above. Review of the MIP images confirms the above findings CT VENOGRAM FINDINGS No evidence of dural venous sinus thrombosis. Specifically, the superior sagittal, sigmoid, transverse, straight, and visualized deep cerebral veins are patent. Wal[place]zyk left transverse sinus. IMPRESSION: 1. No emergent large vessel occlusion or proximal hemodynamically significant stenosis. 2. No evidence of vascular malformation or aneurysm in the region of left temporal hemorrhage, although acute blood products limits assessment. 3. No dural venous sinus thrombosis. 4. 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 This is not in the region of acute hemorrhage. 5. Aortic Atherosclerosis (ICD10-I70.0) and Emphysema (ICD10-J43.9). Electronically Signed   By: Feliberto Harts M.D.   On: 08/06/2023 13:37   CT C-SPINE NO CHARGE  Result Date: 08/06/2023 CLINICAL DATA:  Seizure, decreased responsiveness. Intracranial hemorrhage EXAM: CT CERVICAL SPINE WITHOUT CONTRAST TECHNIQUE: Multidetector CT imaging of the cervical spine was performed without intravenous contrast. Multiplanar CT image reconstructions were also generated. RADIATION DOSE REDUCTION: This exam was performed according to the departmental dose-optimization program which includes automated exposure control, adjustment of the mA and/or kV according to patient size and/or use of iterative reconstruction technique. COMPARISON:   06/01/2023 FINDINGS: Alignment: Facet joints are aligned without dislocation or traumatic listhesis. Dens and lateral masses are aligned. Skull base and vertebrae: No acute fracture. No primary bone lesion or focal pathologic process. Incomplete fusion the posterior arch of C1. Soft tissues and spinal canal: No prevertebral fluid or swelling. No visible canal hematoma. Disc levels: Advanced multilevel cervical spondylosis, most pronounced C4-5 through C6-7. Upper chest: Emphysema.  Aortic atherosclerosis. Other: Carotid atherosclerosis. IMPRESSION: 1. No acute fracture or traumatic listhesis of the cervical spine. 2. Advanced multilevel cervical spondylosis. Aortic Atherosclerosis (ICD10-I70.0) and Emphysema (ICD10-J43.9). Electronically Signed   By: Duanne Guess D.O.   On: 08/06/2023 13:27    Vitals:   08/07/23 0700 08/07/23 0800 08/07/23 0900 08/07/23 1000  BP: 130/63 125/77 132/68 (!) 126/58  Pulse: 82 81 73 81  Resp: 18 20 16 11   Temp:  (!) 97 F (36.1 C)    TempSrc:  Axillary    SpO2: 98% 97% 97% 94%  Weight:      Height:         PHYSICAL EXAM General:  Alert, well-nourished, well-developed patient in no acute distress Psych:  Mood and affect appropriate for situation CV: Regular rate and rhythm on monitor Respiratory:  Regular, unlabored respirations on room air GI: Abdomen soft and nontender   NEURO:  Mental Status: Alert and oriented to self and age. Perseverative on age in subsequent orientation questions, answering "76" when asked what month it was.  Speech/Language: speech is without dysarthria or aphasia. Paucity of speech. Naming, repetition, fluency, and comprehension intact.  Cranial Nerves:  II: PERRL. Visual fields full.  III, IV, VI: EOMI. Eyelids elevate symmetrically.  V: Sensation is intact to light touch and symmetrical to face.  VII: Face is symmetrical resting and smiling VIII: hearing intact to voice. IX, X: Palate elevates symmetrically. Phonation is  normal.  WU:[id number] shrug 5/5. XII: tongue is midline without fasciculations. Motor: Upper extremities: 4/5, no drift. Lower extremities: 3/5, no drift, age-appropriate.  Tone:  is normal and bulk is normal Sensation- Intact to light touch bilaterally.  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 Advanced chronic white matter disease. Right hemisphere chronic micro-hemorrhages in brain, seen on previous MRI.  CTA head & neck: No emergent large vessel occlusion. No evidence of AVM/aneurism in region of hemorrhage. No dural venous sinus thromboses. 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  CT Venogram: no venous sinus thromboses.  MRI: 2.7 x 1.4 x 1.0 cm intraparenchymal hemorrhage in left temporal lobe, stable. Vasogenic edema present without surrounding mass effect. No underlying mass. Chronic siderosis with a few chronic  parenchymal micro hemorrhages clustered about the high posterior right frontoparietal region, consistent with prior hemorrhage at this location. Underlying mild cerebral atrophy with severe chronic microvascular ischemic disease, similar to previous. 2D Echo  LVEF: 60-65%. L atrium normal. No L ventricular dysfunction. Valves normal. LDL 167 HgbA1c 6.0 VTE prophylaxis - heparin subq aspirin 81 mg daily prior to admission, now on No antithrombotic.  Therapy recommendations:  Awaiting PT/OT recs.  Disposition:  Awaiting recs.   Seizure 9/2 seizure activity with tonic posturing, eyes open, fixed and not responsive, followed by postictal state.  Loaded with keppra and now on keppra 500 bid LTM EEG: Suggestive of cortical disfunction 2/2  undelrying structural bormality/bleed. Moderate diffuse encephalopathy. No seizures or epileptiform discharges.  Seizure precautions  Hx of Stroke/TIA 05/2023 - fall with left humerus fracture. CT and MRI head showed Genther right frontal ICH with severe leukoencephalopathy.   Hypertension Home meds:  none Stable, did not need cleviprex, labetalol or other anti-hypertensive medications. Blood Pressure Goal: SBP less than 160  Long term BP goal normotensive  Hyperlipidemia Home meds:  none LDL 167, goal < 70 Not SATURN candidate as she is not on statin at home Will consider statin on discharge   Dysphagia Patient has post-stroke dysphagia, SLP consulted Now on dys 3 and thin liquid Advance diet as tolerated  Other Stroke Risk Factors Coronary artery disease Advanced age   Other Active Problems Hypokalemia: 3.3 COPD: Asymptomatic at present, resume home meds and PRN Neuropathy - following with [facility] Dr. Vickey Huger  Cognitive decline - since fall in 11/2022  Hospital day # 1   ATTENDING NOTE: I reviewed above note and agree with the assessment and plan. Pt was seen and examined.   Daughter at the bedside. Pt lying in bed, has dressing for LTM. Awake, alert, eyes open, orientated to self and age, but perseverated on age for further orientation questions. Also perseverated on age for object naming, but able to repeat without difficulty. No aphasia but paucity of speech, following simple commands. No gaze palsy, tracking bilaterally, visual field full. No facial droop. Tongue midline. Bilateral UEs 4/5, no drift. Bilaterally LEs 3/5, no drift. Sensation symmetrical bilaterally, b/l FTN intact although slow bilaterally, gait not tested.   Etiology for pt Tiede left temporal ICH not quite clear, her BP was not elevated or needing BP meds and ICH location not typical for HTN. Her MRI not typical for CAA but does have some cortical CMBs and siderosis. 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 MRI this morning showed stable hematoma. LTM EEG  no seizure. Continue keppra.  For detailed assessment and plan, please refer to above as I have made changes wherever appropriate.   Marvel Plan, MD PhD Stroke Neurology 08/07/2023 5:13 PM  This patient is critically ill due to left temporal ICH, seizure and at significant risk of neurological worsening, death form hematoma expansion, brain herniation, status epilepticus. This patient's care requires constant monitoring of vital signs, hemodynamics, respiratory and cardiac monitoring, review of multiple databases, neurological assessment, discussion with family, other specialists and medical decision making of high complexity. I spent 40 minutes of neurocritical care time in the care of this patient. I had long discussion with daughter at bedside, updated pt current condition, treatment plan and potential prognosis, and answered all the questions. She expressed understanding and appreciation.     To contact Stroke Continuity provider, please refer to [email or web address]. After hours, contact General Neurology

## 2023-08-07 NOTE — Progress Notes (Signed)
LTM EEG disconnected - no skin breakdown at unhook.  

## 2023-08-07 NOTE — Procedures (Addendum)
Patient Name: Penny [name]  MRN: [id number]  Epilepsy Attending: Charlsie Quest  Referring Physician/Provider: Milon Dikes, MD  Duration: 08/06/2023 1756 to 08/07/2023 1051  Patient history: 89 y.o. female with a past medical history of COPD, hyperlipidemia, hypertension presenting to the emergency department with altered mental status. Patient had seizure like activity witnessed by family, EMS called and she went unresponsive.  EEG to evaluate for seizure  Level of alertness: Awake, asleep  AEDs during EEG study: LEV  Technical aspects: This EEG study was done with scalp electrodes positioned according to the 10-20 International system of electrode placement. Electrical activity was reviewed with band pass filter of 1-70Hz , sensitivity of 7 uV/mm, display speed of 74mm/sec with a 60Hz  notched filter applied as appropriate. EEG data were recorded continuously and digitally stored.  Video monitoring was available and reviewed as appropriate.  Description: The posterior dominant rhythm consists of 8 Hz activity of moderate voltage (25-35 uV) seen predominantly in posterior head regions, asymmetric ( left<right) and reactive to eye opening and eye closing. Sleep was characterized by vertex waves, sleep spindles (12 to 14 Hz), maximal frontocentral region.  EEG showed continuous generalized and maximal left temporal 3 to 6 Hz theta-delta slowing, at times with triphasic morphology. Hyperventilation and photic stimulation were not performed.     EEG was disconnected between 08/06/2023 2042 to 2128.  ABNORMALITY - Continuous slow, generalized and maximal left temporal  - Background asymmetry, left<right  IMPRESSION: This study is suggestive of cortical dysfunction arising from left temporal region likely secondary to underlying structural abnormality/ bleed. Additionally there is moderate diffuse encephalopathy. No seizures or epileptiform discharges were seen throughout the  recording.  Penny [name]

## 2023-08-08 DIAGNOSIS — E782 Mixed hyperlipidemia: Secondary | ICD-10-CM | POA: Diagnosis not present

## 2023-08-08 DIAGNOSIS — I611 Nontraumatic intracerebral hemorrhage in hemisphere, cortical: Secondary | ICD-10-CM | POA: Diagnosis not present

## 2023-08-08 MED ORDER — EZETIMIBE 10 MG PO TABS
10.0000 mg | ORAL_TABLET | Freq: Every day | ORAL | Status: DC
  Administered 2023-08-08 – 2023-08-10 (×3): 10 mg via ORAL
  Filled 2023-08-08 (×3): qty 1

## 2023-08-08 NOTE — TOC Initial Note (Signed)
Transition of Care [facility]) - Initial/Assessment Note    Patient Details  Name: Penny [name] MRN: [id number] Date of Birth: [birth year]  Transition of Care [facility]) CM/SW Contact:    Baldemar Lenis, LCSW Phone Number: 08/08/2023, 3:15 PM  Clinical Narrative:    CSW met with patient and adult children at bedside (son, [name], and daughter, Dava Najjar) to discuss recommendation for SNF. Family in agreement. Patient recently at [facility] after a broken arm, stayed for three weeks. Per Hannah Beat, discharged home on July 22, so patient will be in copay days. CSW explained that to family, they indicated understanding. Family aware they can contact insurance company to obtain information on exact copay amounts and out of pocket maximum amounts. CSW provided family with CMS choice list to review and determine preference for SNF, received permission to fax out referral. CSW to follow with bed offers.    Expected Discharge Plan: Skilled Nursing Facility Barriers to Discharge: Continued Medical Work up, English as a second language teacher   Patient Goals and CMS Choice Patient states their goals for this hospitalization and ongoing recovery are:: pt unable to state CMS Medicare.gov Compare Post Acute Care list provided to:: Patient Represenative (must comment) Choice offered to / list presented to : Adult Children [place] ownership interest in [facility].provided to:: Adult Children    Expected Discharge Plan and Services     Post Acute Care Choice: Skilled Nursing Facility Living arrangements for the past 2 months: Single Family Home                                      Prior Living Arrangements/Services Living arrangements for the past 2 months: Single Family Home Lives with:: Adult Children Patient language and need for interpreter reviewed:: No Do you feel safe going back to the place where you live?: Yes      Need for Family Participation in Patient Care: Yes  (Comment) Care giver support system in place?: No (comment)   Criminal Activity/Legal Involvement Pertinent to Current Situation/Hospitalization: No - Comment as needed  Activities of Daily Living      Permission Sought/Granted Permission sought to share information with : Facility Medical sales representative, Family Supports Permission granted to share information with : Yes, Verbal Permission Granted  Share Information with NAME: Scherrie November  Permission granted to share info w AGENCY: SNF  Permission granted to share info w Relationship: Children     Emotional Assessment Appearance:: Appears stated age Attitude/Demeanor/Rapport: Lethargic Affect (typically observed): Quiet Orientation: : Oriented to Self, Oriented to Place, Oriented to  Time, Oriented to Situation Alcohol / Substance Use: Not Applicable Psych Involvement: No (comment)  Admission diagnosis:  ICH (intracerebral hemorrhage) (HCC) [I61.9] Nontraumatic intracerebral hemorrhage, unspecified cerebral location, unspecified laterality (HCC) [I61.9] Hemorrhagic stroke [facility]) [I61.9] Patient Active Problem List   Diagnosis Date Noted   ICH (intracerebral hemorrhage) (HCC) 08/06/2023   Hemorrhagic stroke (HCC) 08/06/2023   Intracranial hemorrhage (HCC) 06/01/2023   Closed fracture of left proximal humerus 06/01/2023   Fall at home, initial encounter 06/01/2023   History of COPD 06/01/2023   Allergic rhinitis 06/01/2023   Depression 06/01/2023   Intraparenchymal hematoma of brain (HCC) 06/01/2023   Closed fracture of shaft of left humerus 06/01/2023   Rigidity (muscles) 05/21/2023   Traumatic head injury less than 3 months ago 05/21/2023   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   Malignant neoplasm of upper lobe of lung,  unspecified laterality (HCC) 01/07/2023   Syncope    Symptomatic sinus bradycardia 09/10/2022   Hypokalemia 09/10/2022   Hypomagnesemia 09/10/2022   CAD (coronary artery disease) 09/10/2022   COVID-19  (11/30/2021) 12/01/2021   B12 deficiency 06/28/2020   Abnormal glucose 10/09/2018   Osteoporosis 12/28/2015   COPD (chronic obstructive pulmonary disease) with emphysema (HCC) 02/25/2015   Atherosclerosis of aorta (HCC) BY ct SCAN IN 2015 02/25/2015   Essential hypertension    Hyperlipidemia, mixed    Vitamin D deficiency    Personal history of lung cancer 12/04/1996   PCP:  Lucky Cowboy, MD Pharmacy:   [facility] [id number] [place],  - [address] [place] [address] Phone: [phone] Fax: [phone]     Social Determinants of Health (SDOH) Social History: SDOH Screenings   Food Insecurity: No Food Insecurity (06/01/2023)  Housing: Low Risk  (06/01/2023)  Transportation Needs: No Transportation Needs (06/01/2023)  Utilities: Not At Risk (06/01/2023)  Depression (PHQ2-9): Medium Risk (05/14/2023)  Tobacco Use: Medium Risk (08/06/2023)   SDOH Interventions:     Readmission Risk Interventions     No data to display

## 2023-08-08 NOTE — NC FL2 (Addendum)
[place] MEDICAID FL2 LEVEL OF CARE FORM     IDENTIFICATION  Patient Name: Penny [name] Birthdate: 1933/03/03 Sex: female Admission Date (Current Location): 08/06/2023  [facility] and [place] Number:  Producer, television/film/video and Address:  [facility], [address], [place], [place] [address]      Provider Number: [id number]  Attending Physician Name and Address:  Stroke, Md, MD  Relative Name and Phone Number:       Current Level of Care: Hospital Recommended Level of Care: Skilled Nursing Facility Prior Approval Number:    Date Approved/Denied:   PASRR Number: [id number]  Discharge Plan: SNF    Current Diagnoses: Patient Active Problem List   Diagnosis Date Noted   ICH (intracerebral hemorrhage) (HCC) 08/06/2023   Hemorrhagic stroke (HCC) 08/06/2023   Intracranial hemorrhage (HCC) 06/01/2023   Closed fracture of left proximal humerus 06/01/2023   Fall at home, initial encounter 06/01/2023   History of COPD 06/01/2023   Allergic rhinitis 06/01/2023   Depression 06/01/2023   Intraparenchymal hematoma of brain (HCC) 06/01/2023   Closed fracture of shaft of left humerus 06/01/2023   Rigidity (muscles) 05/21/2023   Traumatic head injury less than 3 months ago 05/21/2023   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   Malignant neoplasm of upper lobe of lung, unspecified laterality (HCC) 01/07/2023   Syncope    Symptomatic sinus bradycardia 09/10/2022   Hypokalemia 09/10/2022   Hypomagnesemia 09/10/2022   CAD (coronary artery disease) 09/10/2022   COVID-19 (11/30/2021) 12/01/2021   B12 deficiency 06/28/2020   Abnormal glucose 10/09/2018   Osteoporosis 12/28/2015   COPD (chronic obstructive pulmonary disease) with emphysema (HCC) 02/25/2015   Atherosclerosis of aorta (HCC) BY ct SCAN IN 2015 02/25/2015   Essential hypertension    Hyperlipidemia, mixed    Vitamin D deficiency    Personal history of lung cancer 12/04/1996    Orientation  RESPIRATION BLADDER Height & Weight     Situation, Time, Self, Place  Normal Incontinent Weight: 138 lb 14.2 oz (63 kg) Height:  5\' 2"  (157.5 cm)  BEHAVIORAL SYMPTOMS/MOOD NEUROLOGICAL BOWEL NUTRITION STATUS      Continent Diet (See DC Summary)  AMBULATORY STATUS COMMUNICATION OF NEEDS Skin   Extensive Assist Verbally Normal                       Personal Care Assistance Level of Assistance  Bathing, Feeding, Dressing Bathing Assistance: Maximum assistance Feeding assistance: Limited assistance Dressing Assistance: Maximum assistance     Functional Limitations Info             SPECIAL CARE FACTORS FREQUENCY  PT (By licensed PT), OT (By licensed OT), Speech therapy     PT Frequency: 5x/W OT Frequency: 5x/W     Speech Therapy Frequency: 5x/W      Contractures Contractures Info: Not present    Additional Factors Info  Code Status, Allergies Code Status Info: Full Allergies Info: Ace Inhibitors, Calcium-containing Compounds, Lactose Intolerance (Gi), Polysporin (Bacitracin-polymyxin B)           Current Medications (08/08/2023):  This is the current hospital active medication list Current Facility-Administered Medications  Medication Dose Route Frequency Provider Last Rate Last Admin   acetaminophen (TYLENOL) tablet 650 mg  650 mg Oral Q4H PRN Milon Dikes, MD       Or   acetaminophen (TYLENOL) 160 MG/5ML solution 650 mg  650 mg Per Tube Q4H PRN Milon Dikes, MD  Or   acetaminophen (TYLENOL) suppository 650 mg  650 mg Rectal Q4H PRN Milon Dikes, MD       arformoterol [facility]) nebulizer solution 15 mcg  15 mcg Nebulization BID Marvel Plan, MD   15 mcg at 08/08/23 0714   budesonide (PULMICORT) nebulizer solution 0.25 mg  0.25 mg Nebulization BID Marvel Plan, MD   0.25 mg at 08/08/23 6213   buPROPion [facility]) tablet 75 mg  75 mg Oral Daily Marvel Plan, MD   75 mg at 08/08/23 1039   Chlorhexidine Gluconate Cloth 2 % PADS 6 each  6 each Topical Daily  Milon Dikes, MD   6 each at 08/08/23 1000   cholecalciferol (VITAMIN D3) 25 MCG (1000 UNIT) tablet 5,000 Units  5,000 Units Oral Daily Marvel Plan, MD   5,000 Units at 08/08/23 1040   labetalol (NORMODYNE) injection 20 mg  20 mg Intravenous Once Milon Dikes, MD       And   clevidipine (CLEVIPREX) infusion 0.5 mg/mL  0-21 mg/hr Intravenous Continuous Milon Dikes, MD       heparin injection 5,000 Units  5,000 Units Subcutaneous Q12H Marvel Plan, MD   5,000 Units at 08/08/23 1040   ipratropium (ATROVENT) nebulizer solution 0.5 mg  0.5 mg Nebulization Q6H Marvel Plan, MD   0.5 mg at 08/08/23 0714   levETIRAcetam (KEPPRA) IVPB 500 mg/100 mL premix  500 mg Intravenous Q12H Milon Dikes, MD   Stopped at 08/08/23 0556   montelukast (SINGULAIR) tablet 10 mg  10 mg Oral q morning Marvel Plan, MD   10 mg at 08/08/23 1044   pantoprazole (PROTONIX) EC tablet 40 mg  40 mg Oral Daily Marvel Plan, MD   40 mg at 08/08/23 1040   senna-docusate (Senokot-S) tablet 1 tablet  1 tablet Oral BID Milon Dikes, MD   1 tablet at 08/08/23 1040   vitamin B-12 (CYANOCOBALAMIN) tablet 1,000 mcg  1,000 mcg Oral Daily Marvel Plan, MD   1,000 mcg at 08/08/23 1040     Discharge Medications: Please see discharge summary for a list of discharge medications.  Relevant Imaging Results:  Relevant Lab Results:   Additional Information SSN: [id number]  Antion Felipa [place], Student-Social Work

## 2023-08-08 NOTE — Progress Notes (Addendum)
STROKE TEAM PROGRESS NOTE   BRIEF HPI [name] is a 89 y.o. female with history of COPD, HTN, HLD t/w aspirin presenting with witnessed seizure-like activity (+incontinence of bladder, +post-ictal state). Previously on metoprolol and hydrochlorothiazide for HTN, d/c'd in setting of bradycardia. CTH showed Figiel ICH in left temporal lobe w/ edema, w/o mass effect, likely nidus. In the past, patient had fallen at work (December 2023) with no LOC, -- head CT workup showed acute IPH in right frontal cortex. Hospital day 1.  SIGNIFICANT HOSPITAL EVENTS 9/2: CTH shows L temporal hemorrhage, confirmed with MRI. Placed on IV Keppra prophylaxis. BPs stable.   9/3: EEG negative. Speech: +dysphagia, placed on dysphagia 3 diet. Needs SLP follow-up.  INTERIM HISTORY/SUBJECTIVE  NAEON. Patient transferred to 3W. She "needed to go to the bathroom" with catheter in place. Bladder scan volume, I/O cath collected 600 mL.   On interview with daughter in room, patient appears more alert and oriented to conversation. Daughter had questions about course of patient's recovery ISO severe leukoencephalopathy -- informed her that there is nothing to be done about underlying etiology, but that SNF rehabilitation provides best chance for maintaining/moderately improving current functioning.   OBJECTIVE  CBC    Component Value Date/Time   WBC 6.7 08/07/2023 1037   RBC 4.21 08/07/2023 1037   HGB 11.6 (L) 08/07/2023 1037   HCT 36.2 08/07/2023 1037   PLT 156 08/07/2023 1037   MCV 86.0 08/07/2023 1037   MCH 27.6 08/07/2023 1037   MCHC 32.0 08/07/2023 1037   RDW 14.5 08/07/2023 1037   LYMPHSABS 0.7 08/06/2023 0946   MONOABS 0.8 08/06/2023 0946   EOSABS 0.0 08/06/2023 0946   BASOSABS 0.0 08/06/2023 0946    BMET    Component Value Date/Time   NA 139 08/07/2023 1037   K 3.1 (L) 08/07/2023 1037   CL 103 08/07/2023 1037   CO2 26 08/07/2023 1037   GLUCOSE 118 (H) 08/07/2023 1037   BUN 11  08/07/2023 1037   CREATININE 0.61 08/07/2023 1037   CREATININE 0.69 07/05/2023 1605   CALCIUM 8.7 (L) 08/07/2023 1037   EGFR 83 07/05/2023 1605   GFRNONAA >60 08/07/2023 1037   GFRNONAA 59 (L) 02/07/2021 1450    IMAGING past 24 hours No results found.  Vitals:   08/08/23 0718 08/08/23 0722 08/08/23 0728 08/08/23 1139  BP:   139/69 128/84  Pulse:   84 91  Resp:   16 14  Temp:   98 F (36.7 C) 97.7 F (36.5 C)  TempSrc:   Oral Oral  SpO2: 95% 95% 93% 94%  Weight:      Height:         PHYSICAL EXAM General:  Alert, well-nourished, well-developed patient sitting up in no acute distress Psych:  Pleasant, mood and affect appropriate for situation CV: Regular rate and rhythm on monitor Respiratory:  Regular, unlabored respirations on room air GI: Abdomen soft and nontender   NEURO:  Mental Status: Alert and oriented to self, age, year, and situation. Speech/Language: speech is without dysarthria or aphasia. Paucity of speech. Naming, repetition, fluency, and comprehension intact.  Cranial Nerves:  II: PERRL. Visual fields full.  III, IV, VI: EOMI. Eyelids elevate symmetrically.  V: Sensation is intact to light touch and symmetrical to face.  VII: Face is symmetrical resting and smiling VIII: hearing intact to voice. IX, X: Palate elevates symmetrically. Phonation is normal.  LK:[id number] shrug 5/5. XII: tongue is midline without fasciculations. Motor: Upper extremities: 4/5,  no drift. Lower extremities: 3/5, no drift, age-appropriate.  Tone: is normal and bulk is normal Sensation: Intact to light touch bilaterally.  Coordination: FTN intact bilaterally Gait: deferred   ASSESSMENT/PLAN  ICH - intraparenchymal L temporal lobe [place] hemorrhage, etiology unclear, could be CAA vs. severe leukoencephalopathy. HA and confusion since Sunday 9/1 Code Stroke CT head: acute intra-axial hemorrhage in L temporal lobe (3-4 mL) w/edema but w/o significant mass effect. Advanced  chronic white matter disease. Right hemisphere chronic micro-hemorrhages in brain, seen on previous MRI.  CTA head & neck: No emergent large vessel occlusion. No evidence of AVM/aneurism in region of hemorrhage. No dural venous sinus thromboses. 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  CT Venogram: no venous sinus thromboses.  MRI: 2.7 x 1.4 x 1.0 cm intraparenchymal hemorrhage in left temporal lobe, stable. Vasogenic edema present without surrounding mass effect. No underlying mass. Chronic siderosis with a few chronic parenchymal micro hemorrhages clustered about the high posterior right frontoparietal region, consistent with prior hemorrhage at this location. Underlying mild cerebral atrophy with severe chronic microvascular ischemic disease, similar to previous. 2D Echo  LVEF: 60-65%. L atrium normal. No L ventricular dysfunction. Valves normal. LDL 167 HgbA1c 6.0 VTE prophylaxis - heparin subq aspirin 81 mg daily prior to admission, now on No antithrombotic.  Therapy recommendations: SNF Disposition:  pending   Seizure 9/2 seizure activity with tonic posturing, eyes open, fixed and not responsive, followed by postictal state.  Loaded with keppra and now on keppra 500 bid LTM EEG: Suggestive of cortical disfunction 2/2 undelrying structural bormality/bleed. Moderate diffuse encephalopathy. No seizures or epileptiform discharges.  Seizure precautions  Hx of Stroke/TIA 05/2023 - fall with left humerus fracture. CT and MRI head showed Delcid right frontal ICH with severe leukoencephalopathy.   Hypertension Home meds:  none Stable, did not need cleviprex, labetalol or other anti-hypertensive medications. Blood Pressure Goal: SBP less than 160  Long term BP goal normotensive  Hyperlipidemia Home meds:  none LDL 167, goal < 70 Not SATURN candidate as she is not on statin at home Per notes, zocor stopped due to myalgia and pt not tolerating crestor Given current  bleeding, gait disorder, hx of intolerance with zocor and crestor, will not initiate statin at this time.  Put on zetia Continue Zetia on discharge  Dysphagia Patient has post-stroke dysphagia, SLP consulted Was on dys 3 and thin liquid Now advanced to regular diet and thin liquid  Other Stroke Risk Factors Coronary artery disease Advanced age  Other Active Problems Hypokalemia: 3.3 COPD: Asymptomatic at present, resume home meds and PRN Neuropathy - following with [facility] Dr. Vickey Huger  Cognitive decline - since fall in 11/2022  Hospital day # 2    ATTENDING NOTE: I reviewed above note and agree with the assessment and plan. Pt was seen and examined.   Daughter at the bedside. Pt lying in bed, eyes open, more awake alert than yesterday. No seizure activity overnight. Orientated to place, time and age, no perseveration today. No aphasia, able to name and repeat. LUE on sling due to fracture in 05/2023, no lifting up due to pain but per RN and daughter she was able to hold LUE up without drift earlier today. BLE 3/5 proximally and distally. PT and OT recommend SNF. LDL was high, hx of intolerance to statin, will start zetia this time.   For detailed assessment and plan, please refer to above/below as I have made changes wherever appropriate.   Marvel Plan, MD PhD Stroke  Neurology 08/08/2023 6:06 PM

## 2023-08-08 NOTE — Progress Notes (Signed)
Speech Language Pathology Treatment: Dysphagia;Cognitive-Linquistic  Patient Details Name: Penny [name] MRN: [id number] DOB: 04/10/1933 Today's Date: 08/08/2023 Time: 4098-1191 SLP Time Calculation (min) (ACUTE ONLY): 21 min  Assessment / Plan / Recommendation Clinical Impression  Pt seen today for skilled SLP services targeting swallowing and cognitive linguistic goals. Observed pt with trials of thin liquids, purees, and solids with no signs concerning for dysphagia or aspiration. Overall, pt's alertness and awareness have improved which appears to have resolved signs of dysphagia noted during previous session. Recommend upgrading diet to regular texture solids with thin liquids. No further SLP f/u needed for swallowing.   In spontaneous social conversation, pt's language appears significantly improved; however, further assessment revealed ongoing expressive language deficits. Pt oriented x4 today. Pt ~50% accurate during a confrontational naming activity, pt often perseverative on target word and seemingly unaware of errors, stating "I didn't know you wanted me to be so specific" in reference to saying "rubber glove" when asked to name tape. She was able to name only three animals during a one-minute divergent naming activity. 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 Overall, pt continues to exhibit signs of expressive and receptive aphasia and may continue to benefit from SLP f/u to target these deficits both acutely and following d/c. Goals updated. Will continue to follow.     HPI HPI: Penny [name] is an 89 yo female presenting to ED 9/2 after having witnessed seizure-like activity and a period of unresponsiveness and urine incontinence. CTH 9/2 positive for Caligiuri acute intra-axial hemorrhage in L temporal lobe. Admitted 6/28  following a fall with CTH remarkable for punctate focus of acute IPH in R frontal cortex as well as a L humerus fx. Per daughter, pt has had gradual decline in memory. PMH includes COPD, HLD, HTN      SLP Plan  Goals updated      Recommendations for follow up therapy are one component of a multi-disciplinary discharge planning process, led by the attending physician.  Recommendations may be updated based on patient status, additional functional criteria and insurance authorization.    Recommendations  Diet recommendations: Regular;Thin liquid Liquids provided via: Cup;Straw Medication Administration: Whole meds with liquid Supervision: Staff to assist with self feeding;Full supervision/cueing for compensatory strategies Compensations: Minimize environmental distractions;Slow rate;Feijoo sips/bites Postural Changes and/or Swallow Maneuvers: Seated upright 90 degrees                              Goals updated     Gwynneth Aliment, M.A., CF-SLP Speech Language Pathology, Acute Rehabilitation Services  Secure Chat preferred [phone]   08/08/2023, 3:13 PM

## 2023-08-08 NOTE — Evaluation (Signed)
Occupational Therapy Evaluation Patient Details Name: Nicoli Roam MRN: [id number] DOB: 1933-08-03 Today's Date: 08/08/2023   History of Present Illness LEANI ELSTAD is an 89 yo female presenting to ED 9/2 after having witnessed seizure-like activity and a period of unresponsiveness and urine incontinence. CTH 9/2 positive for Dorsi acute intra-axial hemorrhage in Lt temporal lobe. Admitted 6/28 following a fall with CTH remarkable for punctate focus of acute IPH in Rt frontal cortex as well as a Lt humerus fx. Per ortho f/u 8/5 plan to remain NWB on Lt UE in splint and with sling in place to minimize shoulder mobility. PMH includes COPD, HLD, HTN.   Clinical Impression   PTA, pt lives with family and typically able to mobilize with RW. Since L UE fx and SNF rehab, pt has required increased assist for ADLs and family assisting with mobility (+gait belt) at home. Pt presents now with deficits in sitting/standing balance, cognition, strength and endurance. Pt requires Mod-Max A for bed mobility, Mod A to stand with posterior bias correction though unable to successfully take steps this AM. Pt requires Max-Total A for ADLs. Pt's family present, interested in low intensity rehab stay as they are unable to provide the needed 24/7 assist that pt currently requires.        If plan is discharge home, recommend the following: A lot of help with walking and/or transfers;Two people to help with walking and/or transfers;A lot of help with bathing/dressing/bathroom;Two people to help with bathing/dressing/bathroom    Functional Status Assessment  Patient has had a recent decline in their functional status and demonstrates the ability to make significant improvements in function in a reasonable and predictable amount of time.  Equipment Recommendations  Other (comment) (TBD pending progress)    Recommendations for Other Services       Precautions / Restrictions Precautions Precautions:  Fall Restrictions Weight Bearing Restrictions: Yes LUE Weight Bearing: Non weight bearing Other Position/Activity Restrictions: L UE in sling      Mobility Bed Mobility Overal bed mobility: Needs Assistance Bed Mobility: Sit to Supine, Supine to Sit     Supine to sit: Mod assist, HOB elevated Sit to supine: Max assist   General bed mobility comments: max cues to attend to task. some assist with LE off of bed and scooting. pt able to lift trunk without assist. Max A to return to bed with LE and trunk support    Transfers Overall transfer level: Needs assistance Equipment used: Quad cane Transfers: Sit to/from Stand Sit to Stand: Mod assist           General transfer comment: multiple sit to stands due to pt quick fatigue in standing. Mod A overall to correct posterior bias. unable to really lift feet to take steps. pt wanted to try quad cane but often putting it too far from body      Balance Overall balance assessment: Needs assistance, History of Falls Sitting-balance support: Feet supported, No upper extremity supported Sitting balance-Leahy Scale: Poor Sitting balance - Comments: R lateral drift at times with Min A or cues to correct Postural control: Posterior lean, Right lateral lean Standing balance support: Single extremity supported, During functional activity Standing balance-Leahy Scale: Poor                             ADL either performed or assessed with clinical judgement   ADL Overall ADL's : Needs assistance/impaired Eating/Feeding: Set up  Grooming: Set up;Sitting   Upper Body Bathing: Maximal assistance;Sitting   Lower Body Bathing: Maximal assistance;Sit to/from stand   Upper Body Dressing : Maximal assistance;Sitting   Lower Body Dressing: Maximal assistance;Sit to/from stand       Toileting- Architect and Hygiene: Total assistance;Sitting/lateral lean;Bed level         General ADL Comments: Limited by  impaired balance, L UE in sling and quick fatigue     Vision Ability to See in Adequate Light: 0 Adequate Patient Visual Report: No change from baseline Vision Assessment?: No apparent visual deficits     Perception         Praxis         Pertinent Vitals/Pain Pain Assessment Pain Assessment: Faces Faces Pain Scale: Hurts little more Pain Location: low back Pain Descriptors / Indicators: Aching, Discomfort Pain Intervention(s): Monitored during session, Limited activity within patient's tolerance     Extremity/Trunk Assessment Upper Extremity Assessment Upper Extremity Assessment: LUE deficits/detail;Right hand dominant LUE Deficits / Details: able to wiggle digits and make light grasp. no edema in hand. Per Ortho f/u on 07/09/23 "Would like to see her back in 1 month for repeat x-rays and examination we will take the splint off at that time...continue with the sling and we will see her back in 1 month for repeat radiographs and removal of her splint.   Lower Extremity Assessment Lower Extremity Assessment: Defer to PT evaluation   Cervical / Trunk Assessment Cervical / Trunk Assessment: Kyphotic   Communication Communication Communication: Difficulty following commands/understanding Following commands: Follows one step commands inconsistently;Follows one step commands with increased time Cueing Techniques: Verbal cues;Gestural cues;Tactile cues   Cognition Arousal: Alert Behavior During Therapy: WFL for tasks assessed/performed Overall Cognitive Status: Impaired/Different from baseline Area of Impairment: Attention, Memory, Following commands, Safety/judgement, Awareness, Problem solving                   Current Attention Level: Sustained Memory: Decreased recall of precautions, Decreased short-term memory Following Commands: Follows one step commands with increased time, Follows one step commands inconsistently Safety/Judgement: Decreased awareness of safety,  Decreased awareness of deficits Awareness: Intellectual Problem Solving: Slow processing, Decreased initiation, Difficulty sequencing, Requires verbal cues, Requires tactile cues General Comments: inconsistently following commands, does benefit from encouragement to participate. step by step sequencing cues needed and cues to redirect to tasks.     General Comments       Exercises     Shoulder Instructions      Home Living Family/patient expects to be discharged to:: Private residence Living Arrangements: Children (daughter and 48 y/o grandson) Available Help at Discharge: Family;Available PRN/intermittently Type of Home: House Home Access: Stairs to enter Entrance Stairs-Number of Steps: 1 Entrance Stairs-Rails: None Home Layout: One level     Bathroom Shower/Tub: Chief Strategy Officer: Standard     Home Equipment: Agricultural consultant (2 wheels);Shower seat;Other (comment);Nutritional therapist)          Prior Functioning/Environment Prior Level of Function : Needs assist             Mobility Comments: was using hemiwalker after recent SNF stay and DC vs mostly handheld assist with family using gait belt ADLs Comments: set up with BSC use at home during the day when daughter at work. Daughter assisting with ADLs since LUE fx; assists her in morning before going to work and sets her up on couch with everything within reach [facility]). Recently daughter taking half days  at work to be at home in afternoon or work from home but reports she cant do that for extended period        OT Problem List: Decreased strength;Decreased activity tolerance;Impaired balance (sitting and/or standing);Decreased safety awareness;Decreased cognition;Decreased knowledge of use of DME or AE;Impaired UE functional use      OT Treatment/Interventions: Self-care/ADL training;Therapeutic exercise;Energy conservation;DME and/or AE instruction;Therapeutic activities;Patient/family  education;Balance training    OT Goals(Current goals can be found in the care plan section) Acute Rehab OT Goals Patient Stated Goal: would like for pt to increase strength, be able to mobilize and less falls OT Goal Formulation: With patient/family Time For Goal Achievement: 08/22/23 Potential to Achieve Goals: Good  OT Frequency: Min 1X/week    Co-evaluation              AM-PAC OT "6 Clicks" Daily Activity     Outcome Measure Help from another person eating meals?: A Little Help from another person taking care of personal grooming?: A Little Help from another person toileting, which includes using toliet, bedpan, or urinal?: Total Help from another person bathing (including washing, rinsing, drying)?: A Lot Help from another person to put on and taking off regular upper body clothing?: A Lot Help from another person to put on and taking off regular lower body clothing?: A Lot 6 Click Score: 13   End of Session Equipment Utilized During Treatment: Gait belt Nurse Communication: Mobility status  Activity Tolerance: Patient limited by fatigue Patient left: in bed;with call bell/phone within reach;with bed alarm set;with family/visitor present  OT Visit Diagnosis: Unsteadiness on feet (R26.81);Other abnormalities of gait and mobility (R26.89);Muscle weakness (generalized) (M62.81)                Time: 4098-1191 OT Time Calculation (min): 35 min Charges:  OT General Charges $OT Visit: 1 Visit OT Evaluation $OT Eval Moderate Complexity: 1 Mod OT Treatments $Therapeutic Activity: 8-22 mins  Bradd Canary, OTR/L Acute Rehab Services Office: [phone]   Lorre Munroe 08/08/2023, 11:50 AM

## 2023-08-08 NOTE — Progress Notes (Signed)
Admitted patient as transfer from 4N ICU. Pt is alert and oriented only to self. She repeats words that was recently mentioned. She does follow commands and can be easily directed. Ensured safety at all times. Call bell placed within reach. Placed on telemetry. Skin assessed without any breakdown except on scattered bruises on both arms, witnessed by [facility]. SCDs on. Bed wheels locked.

## 2023-08-08 NOTE — Progress Notes (Signed)
Pt states that she needs to go to the bathroom and urinate. Pt has external catheter in place. Reoriented pt but still cannot void. Encouraged the use of bedside commode. Pt was able to sit up in the bed with one person assist, but unable to make a step forward after standing up at the bedside. Assisted pt back in the bed. Bladder scan showed volume. Straight cath done aseptically, output total with no bladder volume residual after repeat. Peri care done. Pt tolerated the procedure well.  Next due to bladder scan: 1000

## 2023-08-08 NOTE — Progress Notes (Signed)
PT Cancellation Note  Patient Details Name: Penny [name] MRN: [id number] DOB: 1933-09-16   Cancelled Treatment:     Attempted to see pt for PT treatment. Upon arrival pt in bed reporting fatigue and inability to perform well with previous OT. CSW arrived and pt/family wanting to speak with CSW, requesting therapist return at a later time. Will attempt again as schedule allows and as pt becomes available.   Marlana Salvage Zaunegger Blima Rich PT, DPT 08/08/2023, 1:38 PM

## 2023-08-08 NOTE — Plan of Care (Signed)
  Problem: Education: Goal: Knowledge of disease or condition will improve Outcome: Progressing Goal: Knowledge of secondary prevention will improve (MUST DOCUMENT ALL) Outcome: Progressing Goal: Knowledge of patient specific risk factors will improve (Mark N/A or DELETE if not current risk factor) Outcome: Progressing   

## 2023-08-09 DIAGNOSIS — I611 Nontraumatic intracerebral hemorrhage in hemisphere, cortical: Secondary | ICD-10-CM | POA: Diagnosis not present

## 2023-08-09 MED ORDER — IPRATROPIUM BROMIDE 0.02 % IN SOLN: 0.5000 mg | Freq: Four times a day (QID) | RESPIRATORY_TRACT | Status: DC | PRN

## 2023-08-09 NOTE — Progress Notes (Signed)
Physical Therapy Treatment Patient Details Name: Penny [name] MRN: [id number] DOB: 08/18/1933 Today's Date: 08/09/2023   History of Present Illness Penny [name] is an 89 yo female presenting to ED 9/2 after having witnessed seizure-like activity and a period of unresponsiveness and urine incontinence. CTH 9/2 positive for Dom acute intra-axial hemorrhage in Lt temporal lobe. Admitted 6/28 following a fall with CTH remarkable for punctate focus of acute IPH in Rt frontal cortex as well as a Lt humerus fx. Per ortho f/u 8/5 plan to remain NWB on Lt UE in splint and with sling in place to minimize shoulder mobility. PMH includes COPD, HLD, HTN.    PT Comments  Pt required mod assist bed mobility, and mod assist stand pivot transfers. She demo poor sitting and standing balance. Pt maintaining flexed posture in sitting and standing. Current POC remains appropriate.   If plan is discharge home, recommend the following: A lot of help with walking and/or transfers;A lot of help with bathing/dressing/bathroom;Assistance with cooking/housework;Direct supervision/assist for medications management;Assist for transportation;Help with stairs or ramp for entrance;Supervision due to cognitive status   Can travel by private vehicle     No  Equipment Recommendations  Other (comment) (defer to next venue)    Recommendations for Other Services       Precautions / Restrictions Precautions Precautions: Fall Required Braces or Orthoses: Sling Restrictions LUE Weight Bearing: Non weight bearing Other Position/Activity Restrictions: L UE in sling     Mobility  Bed Mobility Overal bed mobility: Needs Assistance Bed Mobility: Sit to Supine, Supine to Sit     Supine to sit: Mod assist, HOB elevated Sit to supine: Mod assist   General bed mobility comments: assist with trunk and BLE, cues/assist with NWB LUE    Transfers Overall transfer level: Needs assistance Equipment used: 1 person hand  held assist Transfers: Sit to/from Stand, Bed to chair/wheelchair/BSC Sit to Stand: Mod assist   Step pivot transfers: Mod assist       General transfer comment: pivot transfer bed <> BSC. Cues for sequending. Assist to maintain balance.    Ambulation/Gait                   Stairs             Wheelchair Mobility     Tilt Bed    Modified Rankin (Stroke Patients Only)       Balance Overall balance assessment: Needs assistance, History of Falls Sitting-balance support: Feet supported, No upper extremity supported Sitting balance-Leahy Scale: Poor   Postural control: Posterior lean, Right lateral lean Standing balance support: Single extremity supported, During functional activity Standing balance-Leahy Scale: Poor                              Cognition Arousal: Alert Behavior During Therapy: WFL for tasks assessed/performed Overall Cognitive Status: Impaired/Different from baseline Area of Impairment: Attention, Memory, Following commands, Safety/judgement, Awareness, Problem solving                 Orientation Level: Disoriented to, Place, Situation, Time Current Attention Level: Sustained Memory: Decreased recall of precautions, Decreased short-term memory Following Commands: Follows one step commands with increased time, Follows one step commands inconsistently Safety/Judgement: Decreased awareness of safety, Decreased awareness of deficits Awareness: Intellectual Problem Solving: Slow processing, Decreased initiation, Difficulty sequencing, Requires verbal cues, Requires tactile cues          Exercises  General Comments General comments (skin integrity, edema, etc.): VSS on RA      Pertinent Vitals/Pain Pain Assessment Pain Assessment: Faces Faces Pain Scale: No hurt    Home Living                          Prior Function            PT Goals (current goals can now be found in the care plan section)  Acute Rehab PT Goals Patient Stated Goal: not stated Progress towards PT goals: Progressing toward goals    Frequency    Min 1X/week      PT Plan      Co-evaluation              AM-PAC PT "6 Clicks" Mobility   Outcome Measure  Help needed turning from your back to your side while in a flat bed without using bedrails?: A Lot Help needed moving from lying on your back to sitting on the side of a flat bed without using bedrails?: A Lot Help needed moving to and from a bed to a chair (including a wheelchair)?: A Lot Help needed standing up from a chair using your arms (e.g., wheelchair or bedside chair)?: A Lot Help needed to walk in hospital room?: Total Help needed climbing 3-5 steps with a railing? : Total 6 Click Score: 10    End of Session Equipment Utilized During Treatment: Gait belt;Other (comment) (LUE sling)   Patient left: in bed;with call bell/phone within reach;with bed alarm set;with family/visitor present Nurse Communication: Mobility status PT Visit Diagnosis: Unsteadiness on feet (R26.81);Muscle weakness (generalized) (M62.81);Difficulty in walking, not elsewhere classified (R26.2);Other symptoms and signs involving the nervous system (R29.898);Pain;History of falling (Z91.81);Other abnormalities of gait and mobility (R26.89);Repeated falls (R29.6)     Time: 1610-9604 PT Time Calculation (min) (ACUTE ONLY): 32 min  Charges:    $Therapeutic Activity: 23-37 mins PT General Charges $$ ACUTE PT VISIT: 1 Visit                     Ferd Glassing., PT  Office # [phone]    Penny [name] 08/09/2023, 12:37 PM

## 2023-08-09 NOTE — Care Management Important Message (Signed)
Important Message  Patient Details  Name: Penny [name] MRN: [id number] Date of Birth: 11-26-1933   Medicare Important Message Given:  Yes     Ronn Smolinsky 08/09/2023, 2:45 PM

## 2023-08-09 NOTE — Progress Notes (Addendum)
STROKE TEAM PROGRESS NOTE   BRIEF HPI [name] is a 89 y.o. female with history of COPD, HTN, HLD t/w aspirin presenting with witnessed seizure-like activity (+incontinence of bladder, +post-ictal state). Previously on metoprolol and hydrochlorothiazide for HTN, d/c'd in setting of bradycardia. CTH showed Justice ICH in left temporal lobe w/ edema, w/o mass effect, likely nidus. In the past, patient had fallen at work (December 2023) with no LOC, -- head CT workup showed acute IPH in right frontal cortex. Hospital day 3.  SIGNIFICANT HOSPITAL EVENTS 9/2: CTH shows L temporal hemorrhage, confirmed with MRI. Placed on IV Keppra prophylaxis. BPs stable.   9/3: EEG negative. Speech: +dysphagia, placed on dysphagia 3 diet. Needs SLP follow-up. 9/4: On regular diet from dysphagia 3. Started ezetimibe 10mg  every day ISO LDL 167 and intolerance of statins. SLP: patient continued to show perseveration and expressive . Social work working with family to choose SNF.  INTERIM HISTORY/SUBJECTIVE  NAEON. No refusals. No PRNs.   On interview, patient is pleasant and cheerful, appears unchanged from yesterday. No new focal findings. A&O to self, situation and month. Awaiting placement to SNF.   OBJECTIVE   Vitals: 157/92 106 afebrile 98% on RA. Within goal ISO permissive hypertension.  CBC    Component Value Date/Time   WBC 6.7 08/07/2023 1037   RBC 4.21 08/07/2023 1037   HGB 11.6 (L) 08/07/2023 1037   HCT 36.2 08/07/2023 1037   PLT 156 08/07/2023 1037   MCV 86.0 08/07/2023 1037   MCH 27.6 08/07/2023 1037   MCHC 32.0 08/07/2023 1037   RDW 14.5 08/07/2023 1037   LYMPHSABS 0.7 08/06/2023 0946   MONOABS 0.8 08/06/2023 0946   EOSABS 0.0 08/06/2023 0946   BASOSABS 0.0 08/06/2023 0946    BMET    Component Value Date/Time   NA 139 08/07/2023 1037   K 3.1 (L) 08/07/2023 1037   CL 103 08/07/2023 1037   CO2 26 08/07/2023 1037   GLUCOSE 118 (H) 08/07/2023 1037   BUN 11 08/07/2023  1037   CREATININE 0.61 08/07/2023 1037   CREATININE 0.69 07/05/2023 1605   CALCIUM 8.7 (L) 08/07/2023 1037   EGFR 83 07/05/2023 1605   GFRNONAA >60 08/07/2023 1037   GFRNONAA 59 (L) 02/07/2021 1450    IMAGING past 24 hours No results found.  Vitals:   08/08/23 2040 08/09/23 0101 08/09/23 0451 08/09/23 0452  BP:  (!) 147/93 (!) 157/92   Pulse:  (!) 110 (!) 106 (!) 106  Resp:      Temp:  98.3 F (36.8 C) (!) 97.4 F (36.3 C)   TempSrc:  Oral Oral   SpO2: 99% 98% 98%   Weight:      Height:         PHYSICAL EXAM General:  Alert, well-nourished, well-developed patient sitting up in no acute distress Psych:  Pleasant, mood and affect appropriate for situation CV: Regular rate and rhythm on monitor Respiratory:  Regular, unlabored respirations on room air GI: Abdomen soft and nontender   NEURO:  Mental Status: Alert and oriented to self, age, year, and situation. Speech/Language: speech is without dysarthria or aphasia. Paucity of speech. Naming, repetition, fluency, and comprehension intact.  Cranial Nerves:  II: Visual fields full.  III, IV, VI: EOMI. Eyelids elevate symmetrically.  V: Sensation is intact to light touch and symmetrical to face.  VII: Face is symmetrical resting and smiling VIII: hearing intact to voice. IX, X: Palate elevates symmetrically. Phonation is normal.  ZO:[id number] shrug 5/5.  XII: tongue is midline without fasciculations. Motor: LUE: 4/5, no drift. RUE: In cast. Lower extremities: 3/5, no drift, age-appropriate.  Tone: is normal and bulk is normal Sensation: Intact to light touch bilaterally.  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 HA and confusion since Sunday 9/1 Code Stroke CT head: acute intra-axial hemorrhage in L temporal lobe (3-4 mL) w/edema but w/o significant mass effect.  Advanced chronic white matter disease. Right hemisphere chronic micro-hemorrhages in brain, seen on previous MRI.  CTA head & neck: No emergent large vessel occlusion. No evidence of AVM/aneurism in region of hemorrhage. No dural venous sinus thromboses. 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  CT Venogram: no venous sinus thromboses.  MRI: 2.7 x 1.4 x 1.0 cm intraparenchymal hemorrhage in left temporal lobe, stable. Vasogenic edema present without surrounding mass effect. No underlying mass. Chronic siderosis with a few chronic parenchymal micro hemorrhages clustered about the high posterior right frontoparietal region, consistent with prior hemorrhage at this location. Underlying mild cerebral atrophy with severe chronic microvascular ischemic disease, similar to previous. 2D Echo  LVEF: 60-65%. L atrium normal. No L ventricular dysfunction. Valves normal. LDL 167 HgbA1c 6.0 VTE prophylaxis - heparin subq aspirin 81 mg daily prior to admission, now on No antithrombotic.  Therapy recommendations: SNF Disposition:  No changes in patient status today. Appropriate for discharge, now pending SNF placement logistics. Have requested transfer to hospitalist service -- stroke team will continue to follow.   Seizure 9/2 seizure activity with tonic posturing, eyes open, fixed and not responsive, followed by postictal state.  Loaded with keppra and now on keppra 500 bid LTM EEG: Suggestive of cortical disfunction 2/2 undelrying structural bormality/bleed. Moderate diffuse encephalopathy. No seizures or epileptiform discharges.  Seizure precautions  Hx of Stroke/TIA 05/2023 - fall with left humerus fracture. CT and MRI head showed [place] right frontal ICH with severe leukoencephalopathy.   Hypertension Home meds:  none Stable, did not need cleviprex, labetalol or other anti-hypertensive medications. Blood Pressure Goal: SBP less than 160  Long term BP goal  normotensive  Hyperlipidemia Home meds:  none LDL 167, goal < 70 Not SATURN candidate as she is not on statin at home Per notes, zocor stopped due to myalgia and pt not tolerating crestor Given current bleeding, gait disorder, hx of intolerance with zocor and crestor, will not initiate statin at this time.  Now on Zetia 10 every day  Continue Zetia on discharge  Dysphagia Patient has post-stroke dysphagia, SLP consulted Was on dys 3 and thin liquid Now advanced to regular diet and thin liquid  Other Stroke Risk Factors Coronary artery disease Advanced age  Other Active Problems Hypokalemia: 3.3, no new labs COPD: Asymptomatic at present, resume home meds and PRN Neuropathy - following with [facility] Dr. Vickey Huger  Cognitive decline - since fall in 11/2022  Hospital day # 3  Luiz Iron, MD Psych Resident, PGY-1   I have personally obtained history,examined this patient, reviewed notes, independently viewed imaging studies, participated in medical decision making and plan of care.ROS completed by me personally and pertinent positives fully documented  I have made any additions or clarifications directly to the above note. Agree with note above.  Patient is sitting up comfortably in bed.  No documented seizures.  No focal deficits.  Blood pressure adequately controlled.  Continue Keppra for seizure prophylaxis.  Awaiting transfer to skilled nursing facility  when bed available and after insurance approval.  Patient medically stable for transfer.  Will consider transfer to medical hospitalist team.  No family available at bedside for discussion.  Greater than 50% time during this 35-minute visit was spent in counseling and coordination of care and discussion patient care team and answering questions.  Delia Heady, MD Medical Director [facility] Pager: [phone] 08/09/2023 2:18 PM

## 2023-08-09 NOTE — Plan of Care (Signed)
progressing 

## 2023-08-09 NOTE — Plan of Care (Signed)
  Problem: Education: Goal: Knowledge of disease or condition will improve Outcome: Progressing Goal: Knowledge of secondary prevention will improve (MUST DOCUMENT ALL) Outcome: Progressing Goal: Knowledge of patient specific risk factors will improve (Mark N/A or DELETE if not current risk factor) Outcome: Progressing   

## 2023-08-10 DIAGNOSIS — Z743 Need for continuous supervision: Secondary | ICD-10-CM | POA: Diagnosis not present

## 2023-08-10 DIAGNOSIS — R278 Other lack of coordination: Secondary | ICD-10-CM | POA: Diagnosis not present

## 2023-08-10 DIAGNOSIS — S42302D Unspecified fracture of shaft of humerus, left arm, subsequent encounter for fracture with routine healing: Secondary | ICD-10-CM | POA: Diagnosis not present

## 2023-08-10 DIAGNOSIS — S0990XA Unspecified injury of head, initial encounter: Secondary | ICD-10-CM | POA: Diagnosis not present

## 2023-08-10 DIAGNOSIS — E559 Vitamin D deficiency, unspecified: Secondary | ICD-10-CM | POA: Diagnosis not present

## 2023-08-10 DIAGNOSIS — Z9181 History of falling: Secondary | ICD-10-CM | POA: Diagnosis not present

## 2023-08-10 DIAGNOSIS — Z7401 Bed confinement status: Secondary | ICD-10-CM | POA: Diagnosis not present

## 2023-08-10 DIAGNOSIS — R269 Unspecified abnormalities of gait and mobility: Secondary | ICD-10-CM | POA: Diagnosis not present

## 2023-08-10 DIAGNOSIS — U071 COVID-19: Secondary | ICD-10-CM | POA: Diagnosis not present

## 2023-08-10 DIAGNOSIS — R29898 Other symptoms and signs involving the musculoskeletal system: Secondary | ICD-10-CM | POA: Diagnosis not present

## 2023-08-10 DIAGNOSIS — C3410 Malignant neoplasm of upper lobe, unspecified bronchus or lung: Secondary | ICD-10-CM | POA: Diagnosis not present

## 2023-08-10 DIAGNOSIS — J439 Emphysema, unspecified: Secondary | ICD-10-CM | POA: Diagnosis not present

## 2023-08-10 DIAGNOSIS — R569 Unspecified convulsions: Secondary | ICD-10-CM | POA: Diagnosis not present

## 2023-08-10 DIAGNOSIS — I619 Nontraumatic intracerebral hemorrhage, unspecified: Secondary | ICD-10-CM | POA: Diagnosis not present

## 2023-08-10 DIAGNOSIS — I611 Nontraumatic intracerebral hemorrhage in hemisphere, cortical: Secondary | ICD-10-CM | POA: Diagnosis not present

## 2023-08-10 DIAGNOSIS — R2689 Other abnormalities of gait and mobility: Secondary | ICD-10-CM | POA: Diagnosis not present

## 2023-08-10 DIAGNOSIS — M6281 Muscle weakness (generalized): Secondary | ICD-10-CM | POA: Diagnosis not present

## 2023-08-10 DIAGNOSIS — I610 Nontraumatic intracerebral hemorrhage in hemisphere, subcortical: Secondary | ICD-10-CM | POA: Diagnosis not present

## 2023-08-10 DIAGNOSIS — E785 Hyperlipidemia, unspecified: Secondary | ICD-10-CM | POA: Diagnosis not present

## 2023-08-10 DIAGNOSIS — R6889 Other general symptoms and signs: Secondary | ICD-10-CM | POA: Diagnosis not present

## 2023-08-10 DIAGNOSIS — J449 Chronic obstructive pulmonary disease, unspecified: Secondary | ICD-10-CM | POA: Diagnosis not present

## 2023-08-10 DIAGNOSIS — R58 Hemorrhage, not elsewhere classified: Secondary | ICD-10-CM | POA: Diagnosis not present

## 2023-08-10 DIAGNOSIS — Z85118 Personal history of other malignant neoplasm of bronchus and lung: Secondary | ICD-10-CM | POA: Diagnosis not present

## 2023-08-10 DIAGNOSIS — R9082 White matter disease, unspecified: Secondary | ICD-10-CM | POA: Diagnosis not present

## 2023-08-10 DIAGNOSIS — I69351 Hemiplegia and hemiparesis following cerebral infarction affecting right dominant side: Secondary | ICD-10-CM | POA: Diagnosis not present

## 2023-08-10 DIAGNOSIS — R531 Weakness: Secondary | ICD-10-CM | POA: Diagnosis not present

## 2023-08-10 MED ORDER — ASPIRIN 81 MG PO CHEW: 81.0000 mg | CHEWABLE_TABLET | Freq: Every evening | ORAL | Status: DC

## 2023-08-10 MED ORDER — LEVETIRACETAM IN NACL 500 MG/100ML IV SOLN: 500.0000 mg | Freq: Two times a day (BID) | INTRAVENOUS | Status: DC

## 2023-08-10 MED ORDER — PANTOPRAZOLE SODIUM 40 MG PO TBEC: 40.0000 mg | DELAYED_RELEASE_TABLET | Freq: Every day | ORAL | Status: DC

## 2023-08-10 MED ORDER — SENNOSIDES-DOCUSATE SODIUM 8.6-50 MG PO TABS: 1.0000 | ORAL_TABLET | Freq: Two times a day (BID) | ORAL | 0 refills | Status: DC

## 2023-08-10 MED ORDER — EZETIMIBE 10 MG PO TABS: 10.0000 mg | ORAL_TABLET | Freq: Every day | ORAL | Status: DC

## 2023-08-10 MED ORDER — LEVETIRACETAM 500 MG PO TABS: 500.0000 mg | ORAL_TABLET | Freq: Two times a day (BID) | ORAL | Status: DC

## 2023-08-10 NOTE — TOC Transition Note (Signed)
Transition of Care [facility]) - CM/SW Discharge Note   Patient Details  Name: Penny [name] MRN: [id number] Date of Birth: 1933-03-31  [facility] CM/SW Contact:  Baldemar Lenis, LCSW Phone Number: 08/10/2023, 1:48 PM   Clinical Narrative:   CSW received insurance authorization for patient to admit to SNF today. CSW updated MD, patient is stable for transfer. CSW met with daughter, Dava Najjar, at bedside this morning to update. Once discharge entered, CSW sent to [facility] and confirmed they were ready. CSW met with son, [name], at bedside to discuss transport time. [name] asking for an update when Sharin Mons arrives so he can meet patient at the SNF. Transport arranged with [facility] for next available.  Nurse to call report to [phone].    Final next level of care: Skilled Nursing Facility Barriers to Discharge: Barriers Resolved   Patient Goals and CMS Choice CMS Medicare.gov Compare Post Acute Care list provided to:: Patient Represenative (must comment) Choice offered to / list presented to : Adult Children  Discharge Placement                Patient chooses bed at: [facility] Patient to be transferred to facility by: [facility] Name of family member notified: Scherrie November Patient and family notified of of transfer: 08/10/23  Discharge Plan and Services Additional resources added to the After Visit Summary for       Post Acute Care Choice: Skilled Nursing Facility                               Social Determinants of Health (SDOH) Interventions SDOH Screenings   Food Insecurity: No Food Insecurity (06/01/2023)  Housing: Low Risk  (06/01/2023)  Transportation Needs: No Transportation Needs (06/01/2023)  Utilities: Not At Risk (06/01/2023)  Depression (PHQ2-9): Medium Risk (05/14/2023)  Tobacco Use: Medium Risk (08/06/2023)     Readmission Risk Interventions     No data to display

## 2023-08-10 NOTE — Discharge Instructions (Addendum)
Dear Penny [name],  It was a pleasure taking care of you in the hospital. You were diagnosed with a seizure resulting from a [place] stroke. Please follow these instructions:  1) Please take you Keppra medication as prescribed. It is to prevent you from having more seizures related to your stroke. 2) Do not take your "baby" aspirin 81 mg until 08/15/2023. Then you can restart it as normal.  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 Don't get frustrated! It can take some time, but this is the best way to regain functioning.   I wish you all of the best,  Luiz Iron, MD

## 2023-08-10 NOTE — Discharge Summary (Addendum)
Stroke Discharge Summary  Patient ID: Penny [name]   MRN: [id number]      DOB: 03/01/1933  Date of Admission: 08/06/2023 Date of Discharge: 08/10/2023  Attending Physician:  Stroke, Md, MD Consultant(s):    neurology  Patient's PCP:  Penny Cowboy, MD  DISCHARGE PRIMARY DIAGNOSIS: left temporal ICH indeterminate etiology  Secondary Diagnoses: Symptomatic seizures Right hemiparesis Hypertension Hyperlipidemia  Allergies as of 08/10/2023       Reactions   Ace Inhibitors Cough   Calcium-containing Compounds Other (See Comments)   constpation   Lactose Intolerance (gi) Diarrhea   Polysporin [bacitracin-polymyxin B] Rash   Polysporin or neosporin gave her a rash, couldn't remember which        Medication List     TAKE these medications    acetaminophen 325 MG tablet Commonly known as: TYLENOL Take 650 mg by mouth as needed for mild pain or moderate pain.   aspirin 81 MG chewable tablet Chew 1 tablet (81 mg total) by mouth every evening. Start taking on: August 15, 2023 What changed: These instructions start on August 15, 2023. If you are unsure what to do until then, ask your doctor or other care provider.   Breztri Aerosphere 160-9-4.8 MCG/ACT Aero Generic drug: Budeson-Glycopyrrol-Formoterol Inhale 1 puff into the lungs in the morning and at bedtime.   buPROPion 75 MG tablet Commonly known as: WELLBUTRIN One tablet once a day What changed:  how much to take how to take this when to take this additional instructions   Cholecalciferol 125 MCG (5000 UT) capsule Take 1 capsules Daily What changed:  how much to take how to take this when to take this additional instructions   diphenhydramine-acetaminophen 25-500 MG Tabs tablet Commonly known as: TYLENOL PM Take 1 tablet by mouth at bedtime as needed (prn).   ezetimibe 10 MG tablet Commonly known as: ZETIA Take 1 tablet (10 mg total) by mouth daily. Start taking on: August 11, 2023    HYDROcodone-acetaminophen 5-325 MG tablet Commonly known as: NORCO/VICODIN Take 0.5 tablets by mouth every 6 (six) hours as needed for moderate pain.   levETIRAcetam 500 MG tablet Commonly known as: Keppra Take 1 tablet (500 mg total) by mouth 2 (two) times daily.   montelukast 10 MG tablet Commonly known as: SINGULAIR Take  1 tablet  Daily for Allergies                                                            /                            TAKE                      BY                     MOUTH What changed:  how much to take how to take this when to take this additional instructions   pantoprazole 40 MG tablet Commonly known as: PROTONIX Take 1 tablet (40 mg total) by mouth daily for 15 days. Start taking on: August 11, 2023   senna-docusate 8.6-50 MG tablet Commonly known as: Senokot-S Take 1 tablet by mouth 2 (two) times daily.  Vitamin B-12 1000 MCG Subl Takes 1 tab SL Daily What changed:  how much to take how to take this when to take this additional instructions        LABORATORY STUDIES CBC    Component Value Date/Time   WBC 6.7 08/07/2023 1037   RBC 4.21 08/07/2023 1037   HGB 11.6 (L) 08/07/2023 1037   HCT 36.2 08/07/2023 1037   PLT 156 08/07/2023 1037   MCV 86.0 08/07/2023 1037   MCH 27.6 08/07/2023 1037   MCHC 32.0 08/07/2023 1037   RDW 14.5 08/07/2023 1037   LYMPHSABS 0.7 08/06/2023 0946   MONOABS 0.8 08/06/2023 0946   EOSABS 0.0 08/06/2023 0946   BASOSABS 0.0 08/06/2023 0946   CMP    Component Value Date/Time   NA 139 08/07/2023 1037   K 3.1 (L) 08/07/2023 1037   CL 103 08/07/2023 1037   CO2 26 08/07/2023 1037   GLUCOSE 118 (H) 08/07/2023 1037   BUN 11 08/07/2023 1037   CREATININE 0.61 08/07/2023 1037   CREATININE 0.69 07/05/2023 1605   CALCIUM 8.7 (L) 08/07/2023 1037   PROT 6.7 08/06/2023 0946   ALBUMIN 3.7 08/06/2023 0946   AST 19 08/06/2023 0946   ALT 16 08/06/2023 0946   ALKPHOS 234 (H) 08/06/2023 0946   BILITOT 0.8  08/06/2023 0946   GFRNONAA >60 08/07/2023 1037   GFRNONAA 59 (L) 02/07/2021 1450   GFRAA 68 02/07/2021 1450   COAGS Lab Results  Component Value Date   INR 1.0 08/06/2023   INR 1.0 06/01/2023   Lipid Panel    Component Value Date/Time   CHOL 231 (H) 08/07/2023 1037   TRIG 83 08/07/2023 1037   HDL 47 08/07/2023 1037   CHOLHDL 4.9 08/07/2023 1037   VLDL 17 08/07/2023 1037   LDLCALC 167 (H) 08/07/2023 1037   LDLCALC 193 (H) 05/14/2023 1627   HgbA1C  Lab Results  Component Value Date   HGBA1C 5.4 08/07/2023   Urine Drug Screen Negative Alcohol Level    Component Value Date/Time   [facility] <10 08/06/2023 0946     SIGNIFICANT DIAGNOSTIC STUDIES ECHOCARDIOGRAM COMPLETE  Result Date: 08/07/2023    ECHOCARDIOGRAM REPORT   Patient Name:   Penny [name] Date of Exam: 08/07/2023 Medical Rec #:  [id number]            Height:       62.0 in Accession #:    [id number]           Weight:       138.9 lb Date of Birth:  May 07, 1933             BSA:          1.637 m Patient Age:    89 years             BP:           130/63 mmHg Patient Gender: F                    HR:           82 bpm. Exam Location:  Inpatient Procedure: 2D Echo, Cardiac Doppler and Color Doppler Indications:    Stroke I63.9  History:        Patient has prior history of Echocardiogram examinations, most                 recent 09/11/2022. CAD, COPD, Arrythmias:Bradycardia,  Signs/Symptoms:Syncope; Risk Factors:Hypertension and                 Dyslipidemia.  Sonographer:    Lucendia Herrlich Referring Phys: [id number] ASHISH ARORA IMPRESSIONS  1. Left ventricular ejection fraction, by estimation, is 60 to 65%. The left ventricle has normal function. The left ventricle has no regional wall motion abnormalities. Left ventricular diastolic parameters are consistent with Grade I diastolic dysfunction (impaired relaxation). Elevated left atrial pressure.  2. Right ventricular systolic function is normal. The right ventricular size  is normal. There is mildly elevated pulmonary artery systolic pressure. The estimated right ventricular systolic pressure is 41.8 mmHg.  3. The mitral valve is normal in structure. Trivial mitral valve regurgitation. No evidence of mitral stenosis.  4. The aortic valve is tricuspid. Aortic valve regurgitation is not visualized. No aortic stenosis is present.  5. The inferior vena cava is dilated in size with <50% respiratory variability, suggesting right atrial pressure of 15 mmHg. Comparison(s): Prior images reviewed side by side. There is now evidence of hypervolemia. FINDINGS  Left Ventricle: Left ventricular ejection fraction, by estimation, is 60 to 65%. The left ventricle has normal function. The left ventricle has no regional wall motion abnormalities. The left ventricular internal cavity size was normal in size. There is  no left ventricular hypertrophy. Left ventricular diastolic parameters are consistent with Grade I diastolic dysfunction (impaired relaxation). Elevated left atrial pressure. Right Ventricle: The right ventricular size is normal. No increase in right ventricular wall thickness. Right ventricular systolic function is normal. There is mildly elevated pulmonary artery systolic pressure. The tricuspid regurgitant velocity is 2.59  m/s, and with an assumed right atrial pressure of 15 mmHg, the estimated right ventricular systolic pressure is 41.8 mmHg. Left Atrium: Left atrial size was normal in size. Right Atrium: Right atrial size was normal in size. Pericardium: There is no evidence of pericardial effusion. Mitral Valve: The mitral valve is normal in structure. Trivial mitral valve regurgitation. No evidence of mitral valve stenosis. Tricuspid Valve: The tricuspid valve is normal in structure. Tricuspid valve regurgitation is trivial. No evidence of tricuspid stenosis. Aortic Valve: The aortic valve is tricuspid. Aortic valve regurgitation is not visualized. No aortic stenosis is present.  Aortic valve peak gradient measures 8.3 mmHg. Pulmonic Valve: The pulmonic valve was grossly normal. Pulmonic valve regurgitation is not visualized. No evidence of pulmonic stenosis. Aorta: The aortic root and ascending aorta are structurally normal, with no evidence of dilitation. Venous: The inferior vena cava is dilated in size with less than 50% respiratory variability, suggesting right atrial pressure of 15 mmHg. IAS/Shunts: No atrial level shunt detected by color flow Doppler.  LEFT VENTRICLE PLAX 2D LVIDd:         4.10 cm   Diastology LVIDs:         2.70 cm   LV e' medial:    4.68 cm/s LV PW:         0.90 cm   LV E/e' medial:  17.6 LV IVS:        0.90 cm   LV e' lateral:   6.09 cm/s LVOT diam:     1.90 cm   LV E/e' lateral: 13.5 LV SV:         44 LV SV Index:   27 LVOT Area:     2.84 cm  RIGHT VENTRICLE            IVC RV S prime:     9.88 cm/s  IVC diam:  2.20 cm TAPSE (M-mode): 1.6 cm LEFT ATRIUM             Index        RIGHT ATRIUM          Index LA diam:        2.80 cm 1.71 cm/m   RA Area:     8.60 cm LA Vol (A2C):   30.6 ml 18.69 ml/m  RA Volume:   14.50 ml 8.86 ml/m LA Vol (A4C):   42.7 ml 26.08 ml/m LA Biplane Vol: 36.2 ml 22.11 ml/m  AORTIC VALVE AV Area (Vmax): 1.77 cm AV Vmax:        144.00 cm/s AV Peak Grad:   8.3 mmHg LVOT Vmax:      90.10 cm/s LVOT Vmean:     58.533 cm/s LVOT VTI:       0.156 m  AORTA Ao Root diam: 3.20 cm Ao Asc diam:  3.00 cm MITRAL VALVE                TRICUSPID VALVE MV Area (PHT): 3.53 cm     TR Peak grad:   26.8 mmHg MV Decel Time: 215 msec     TR Vmax:        259.00 cm/s MV E velocity: 82.40 cm/s MV A velocity: 116.00 cm/s  SHUNTS MV E/A ratio:  0.71         Systemic VTI:  0.16 m                             Systemic Diam: 1.90 cm Thurmon Fair MD Electronically signed by Thurmon Fair MD Signature Date/Time: 08/07/2023/10:06:59 AM    Final    Overnight EEG with video  Result Date: 08/07/2023 Charlsie Quest, MD     08/08/2023 10:57 AM Patient Name: Lataja Cheatwood MRN: [id number] Epilepsy Attending: Charlsie Quest Referring Physician/Provider: Milon Dikes, MD Duration: 08/06/2023 1756 to 08/07/2023 1051 Patient history: 89 y.o. female with a past medical history of COPD, hyperlipidemia, hypertension presenting to the emergency department with altered mental status. Patient had seizure like activity witnessed by family, EMS called and she went unresponsive.  EEG to evaluate for seizure Level of alertness: Awake, asleep AEDs during EEG study: LEV Technical aspects: This EEG study was done with scalp electrodes positioned according to the 10-20 International system of electrode placement. Electrical activity was reviewed with band pass filter of 1-70Hz , sensitivity of 7 uV/mm, display speed of 48mm/sec with a 60Hz  notched filter applied as appropriate. EEG data were recorded continuously and digitally stored.  Video monitoring was available and reviewed as appropriate. Description: The posterior dominant rhythm consists of 8 Hz activity of moderate voltage (25-35 uV) seen predominantly in posterior head regions, asymmetric ( left<right) and reactive to eye opening and eye closing. Sleep was characterized by vertex waves, sleep spindles (12 to 14 Hz), maximal frontocentral region.  EEG showed continuous generalized and maximal left temporal 3 to 6 Hz theta-delta slowing, at times with triphasic morphology. Hyperventilation and photic stimulation were not performed.   EEG was disconnected between 08/06/2023 2042 to 2128. ABNORMALITY - Continuous slow, generalized and maximal left temporal - Background asymmetry, left<right IMPRESSION: This study is suggestive of cortical dysfunction arising from left temporal region likely secondary to underlying structural abnormality/ bleed. Additionally there is moderate diffuse encephalopathy. No seizures or epileptiform discharges were seen throughout the recording. Charlsie Quest   MR BRAIN W  WO CONTRAST  Result Date:  08/07/2023 CLINICAL DATA:  Follow-up examination for hemorrhagic stroke. EXAM: MRI HEAD WITHOUT AND WITH CONTRAST TECHNIQUE: Multiplanar, multiecho pulse sequences of the brain and surrounding structures were obtained without and with intravenous contrast. CONTRAST:  6mL GADAVIST GADOBUTROL 1 MMOL/ML IV SOLN COMPARISON:  Comparison made with multiple CTs performed earlier the same day as well as previous MRI from 06/25/2023 FINDINGS: Brain: Relatively mild cerebral atrophy for age. Extensive confluent T2/FLAIR hyperintensity involving the periventricular and deep white matter both cerebral hemispheres, deep gray nuclei, and pons, most likely related to severe chronic microvascular ischemic disease. Appearance is similar to previous. Previously identified intraparenchymal hemorrhage positioned at the left temporal lobe again seen, relatively stable in size and distribution measuring 2.7 x 1.4 x 1.0 cm (estimated volume 2 mL). Surrounding vasogenic edema without significant regional mass effect. No underlying mass or abnormal enhancement seen at this location. No other evidence for acute or subacute ischemia. Gray-white matter differentiation otherwise maintained. Chronic siderosis with a few parenchymal micro hemorrhages clustered about the high posterior right frontoparietal region, consistent with prior hemorrhage at this location (series 8, image 53). This is similar to previous. Few Goeser chronic micro hemorrhages about the cerebellum and right frontal lobe, stable, and likely hypertensive in nature. There is a new chronic microhemorrhage at the left occipital lobe (series 8, image 33). No mass lesion or midline shift. Ventricular prominence related to global parenchymal volume loss without hydrocephalus. No significant extra-axial fluid collection. Pituitary gland and suprasellar region within normal limits. No abnormal enhancement. Vascular: Major intracranial vascular flow voids are maintained. Skull and upper  cervical spine: Craniocervical junction within normal limits. Bone marrow signal intensity normal. No visible scalp soft tissue abnormality. Sinuses/Orbits: Prior bilateral ocular lens replacement. Globes and orbital soft tissues otherwise unremarkable. Paranasal sinuses are largely clear. No significant mastoid effusion. Other: None. IMPRESSION: 1. 2.7 x 1.4 x 1.0 cm intraparenchymal hemorrhage at the left temporal lobe, stable from prior. Surrounding vasogenic edema without significant regional mass effect. No underlying mass or abnormal enhancement seen at this location. 2. No other acute intracranial abnormality. 3. Chronic siderosis with a few chronic parenchymal micro hemorrhages clustered about the high posterior right frontoparietal region, consistent with prior hemorrhage at this location. 4. Single new chronic microhemorrhage at the left occipital lobe as above. 5. Underlying mild cerebral atrophy with severe chronic microvascular ischemic disease, similar to previous. Electronically Signed   By: Rise Mu M.D.   On: 08/07/2023 01:00   CT ANGIO HEAD NECK W WO CM  Result Date: 08/06/2023 CLINICAL DATA:  Neuro deficit, acute, stroke suspected; Stroke, hemorrhagic. EXAM: CT ANGIOGRAPHY HEAD AND NECK WITH AND WITHOUT CONTRAST CT VENOGRAM TECHNIQUE: Multidetector CT imaging of the head and neck was performed using the standard protocol during bolus administration of intravenous contrast. Multiplanar CT image reconstructions and MIPs were obtained to evaluate the vascular anatomy. Carotid stenosis measurements (when applicable) are obtained utilizing NASCET criteria, using the distal internal carotid diameter as the denominator. Venographic phase images of the brain were obtained following the administration of intravenous contrast. Multiplanar reformats and maximum intensity projections were generated. RADIATION DOSE REDUCTION: This exam was performed according to the departmental  dose-optimization program which includes automated exposure control, adjustment of the mA and/or kV according to patient size and/or use of iterative reconstruction technique. CONTRAST:  75mL OMNIPAQUE IOHEXOL 350 MG/ML SOLN COMPARISON:  CT head from the same day. FINDINGS: CTA NECK FINDINGS Aortic arch: Great vessel origins are patent.  Atherosclerosis. Right carotid system: No evidence of dissection, stenosis (50% or greater), or occlusion. Left carotid system: No evidence of dissection, stenosis (50% or greater), or occlusion. Vertebral arteries: Left dominant. No evidence of dissection, stenosis (50% or greater), or occlusion. Skeleton: Further evaluated on dedicated concurrent CT of the cervical spine. Other neck: No acute abnormality on limited assessment. Subcentimeter thyroid nodule does not require further imaging follow-up (ref: J Am Coll Radiol. 2015 Feb;12(2): 143-50). Upper chest: Emphysema. Review of the MIP images confirms the above findings CTA HEAD FINDINGS Anterior circulation: Hypoplastic right A1 ACA. Otherwise, bilateral intracranial ICAs, MCAs and ACAs are patent without proximal hemodynamically significant stenosis. 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 Posterior circulation: Bilateral intradural vertebral arteries, basilar artery and bilateral posterior cerebral arteries are patent without proximal hemodynamically significant stenosis. Anatomic variants: Detailed above. Review of the MIP images confirms the above findings CT VENOGRAM FINDINGS No evidence of dural venous sinus thrombosis. Specifically, the superior sagittal, sigmoid, transverse, straight, and visualized deep cerebral veins are patent. Revolorio left transverse sinus. IMPRESSION: 1. No emergent large vessel occlusion or proximal hemodynamically significant stenosis. 2. No evidence of vascular malformation or aneurysm in the region  of left temporal hemorrhage, although acute blood products limits assessment. 3. No dural venous sinus thrombosis. 4. 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 This is not in the region of acute hemorrhage. 5. Aortic Atherosclerosis (ICD10-I70.0) and Emphysema (ICD10-J43.9). Electronically Signed   By: Feliberto Harts M.D.   On: 08/06/2023 13:37   CT VENOGRAM HEAD  Result Date: 08/06/2023 CLINICAL DATA:  Neuro deficit, acute, stroke suspected; Stroke, hemorrhagic. EXAM: CT ANGIOGRAPHY HEAD AND NECK WITH AND WITHOUT CONTRAST CT VENOGRAM TECHNIQUE: Multidetector CT imaging of the head and neck was performed using the standard protocol during bolus administration of intravenous contrast. Multiplanar CT image reconstructions and MIPs were obtained to evaluate the vascular anatomy. Carotid stenosis measurements (when applicable) are obtained utilizing NASCET criteria, using the distal internal carotid diameter as the denominator. Venographic phase images of the brain were obtained following the administration of intravenous contrast. Multiplanar reformats and maximum intensity projections were generated. RADIATION DOSE REDUCTION: This exam was performed according to the departmental dose-optimization program which includes automated exposure control, adjustment of the mA and/or kV according to patient size and/or use of iterative reconstruction technique. CONTRAST:  75mL OMNIPAQUE IOHEXOL 350 MG/ML SOLN COMPARISON:  CT head from the same day. FINDINGS: CTA NECK FINDINGS Aortic arch: Great vessel origins are patent.  Atherosclerosis. Right carotid system: No evidence of dissection, stenosis (50% or greater), or occlusion. Left carotid system: No evidence of dissection, stenosis (50% or greater), or occlusion. Vertebral arteries: Left dominant. No evidence of dissection, stenosis (50% or greater), or occlusion. Skeleton: Further evaluated on dedicated concurrent CT  of the cervical spine. Other neck: No acute abnormality on limited assessment. Subcentimeter thyroid nodule does not require further imaging follow-up (ref: J Am Coll Radiol. 2015 Feb;12(2): 143-50). Upper chest: Emphysema. Review of the MIP images confirms the above findings CTA HEAD FINDINGS Anterior circulation: Hypoplastic right A1 ACA. Otherwise, bilateral intracranial ICAs, MCAs and ACAs are patent without proximal hemodynamically significant stenosis. 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 Posterior circulation: Bilateral intradural vertebral arteries, basilar artery and bilateral posterior cerebral arteries are patent without proximal hemodynamically significant stenosis. Anatomic  variants: Detailed above. Review of the MIP images confirms the above findings CT VENOGRAM FINDINGS No evidence of dural venous sinus thrombosis. Specifically, the superior sagittal, sigmoid, transverse, straight, and visualized deep cerebral veins are patent. Maxcy left transverse sinus. IMPRESSION: 1. No emergent large vessel occlusion or proximal hemodynamically significant stenosis. 2. No evidence of vascular malformation or aneurysm in the region of left temporal hemorrhage, although acute blood products limits assessment. 3. No dural venous sinus thrombosis. 4. 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 This is not in the region of acute hemorrhage. 5. Aortic Atherosclerosis (ICD10-I70.0) and Emphysema (ICD10-J43.9). Electronically Signed   By: Feliberto Harts M.D.   On: 08/06/2023 13:37   CT C-SPINE NO CHARGE  Result Date: 08/06/2023 CLINICAL DATA:  Seizure, decreased responsiveness. Intracranial hemorrhage EXAM: CT CERVICAL SPINE WITHOUT CONTRAST TECHNIQUE: Multidetector CT imaging of the cervical spine was performed without intravenous contrast. Multiplanar CT image  reconstructions were also generated. RADIATION DOSE REDUCTION: This exam was performed according to the departmental dose-optimization program which includes automated exposure control, adjustment of the mA and/or kV according to patient size and/or use of iterative reconstruction technique. COMPARISON:  06/01/2023 FINDINGS: Alignment: Facet joints are aligned without dislocation or traumatic listhesis. Dens and lateral masses are aligned. Skull base and vertebrae: No acute fracture. No primary bone lesion or focal pathologic process. Incomplete fusion the posterior arch of C1. Soft tissues and spinal canal: No prevertebral fluid or swelling. No visible canal hematoma. Disc levels: Advanced multilevel cervical spondylosis, most pronounced C4-5 through C6-7. Upper chest: Emphysema.  Aortic atherosclerosis. Other: Carotid atherosclerosis. IMPRESSION: 1. No acute fracture or traumatic listhesis of the cervical spine. 2. Advanced multilevel cervical spondylosis. Aortic Atherosclerosis (ICD10-I70.0) and Emphysema (ICD10-J43.9). Electronically Signed   By: Duanne Guess D.O.   On: 08/06/2023 13:27   CT Head Wo Contrast  Addendum Date: 08/06/2023   ADDENDUM REPORT: 08/06/2023 11:24 ADDENDUM: Study discussed by telephone with Dr. Alvino Blood on 08/06/2023 at 1106 hours. Electronically Signed   By: Odessa Fleming M.D.   On: 08/06/2023 11:24   Result Date: 08/06/2023 CLINICAL DATA:  89 year old female status post seizure activity, subsequently unresponsive. EXAM: CT HEAD WITHOUT CONTRAST TECHNIQUE: Contiguous axial images were obtained from the base of the skull through the vertex without intravenous contrast. RADIATION DOSE REDUCTION: This exam was performed according to the departmental dose-optimization program which includes automated exposure control, adjustment of the mA and/or kV according to patient size and/or use of iterative reconstruction technique. COMPARISON:  Brain MRI 06/25/2023.  Head CT 06/01/2023.  FINDINGS: Brain: Hyperdense hemorrhage in the left temporal lobe is new from the MRI last month, affecting both the temporal white matter and inferior gyrus and encompasses 26 x 16 x 16 mm (AP by transverse by CC), estimated 3-4 mL volume. Adjacent temporal lobe edema. No significant regional mass effect. No IVH. No extra-axial blood identified. Stable ventricle size and configuration. No midline shift. Vascular/dystrophic calcifications in the bilateral basal ganglia are stable. No other intracranial hemorrhage is evident by CT, chronic hemosiderin in the brain last month was limited to the right hemisphere. Confluent, extensive bilateral cerebral white matter hypodensity appears stable. Normal basilar cisterns. Vascular: No suspicious intracranial vascular hyperdensity. Calcified atherosclerosis at the skull base. Skull: Stable.  No acute osseous abnormality identified. Sinuses/Orbits: Visualized paranasal sinuses and mastoids are stable and well aerated. Other: No acute orbit or scalp soft tissue injury identified. IMPRESSION: 1. Positive for Tello acute intra-axial hemorrhage in the Left  temporal lobe, estimated 3-4 mL. Surrounding edema but no significant mass effect. 2. No extra-axial extension, IVH, or other complicating features. And no other acute intracranial abnormality. Advanced chronic white matter disease. Right hemisphere chronic microhemorrhages in the brain as detailed by MRI last month. Electronically Signed: By: Odessa Fleming M.D. On: 08/06/2023 11:02   DG Chest Port 1 View  Result Date: 08/06/2023 CLINICAL DATA:  Confusion.  Seizure activity. EXAM: PORTABLE CHEST 1 VIEW COMPARISON:  06/01/2023 and back to 02/07/2021 FINDINGS: Per technologist, the patient's bra could not be removed. There also multiple wires and leads which project over the chest. Remote right rib fractures. Left humeral shaft fracture is incompletely imaged but was present on the prior. Mild tracheal deviation to the right.  Surgical changes project over the right-side of the mediastinum. Borderline right paratracheal soft tissue fullness is similar to 02/27/2021 and could be postoperative or projectional in the setting of obliquity. No pleural effusion or pneumothorax. Clear lungs. IMPRESSION: No acute cardiopulmonary disease Electronically Signed   By: Jeronimo Greaves M.D.   On: 08/06/2023 10:27   NCV with EMG(electromyography)  Result Date: 07/11/2023 Levert Feinstein, MD     07/11/2023  5:16 PM     Full Name: Ellisha Olliver Gender: Female MRN #: [id number] Date of Birth: 11-22-1933   Visit Date: 07/11/2023 14:42 Age: 4 Years Examining Physician: Dr. Levert Feinstein Referring Physician: Dr. Porfirio Mylar Dohmeier Height: 5 feet 2 inch History: 89 year old female with sudden worsening gait abnormality since she fell in December 2023. Summary of the test: Nerve conduction study: Bilateral sural, superficial peroneal sensory responses were absent. Right ulnar and median sensory response showed normal snap amplitude, with mild to moderately prolonged peak latency. Right radial sensory response was normal. Bilateral tibial and left peroneal to EDB motor responses were absent. Right ulnar motor response showed mildly decreased CMAP amplitude, with normal right ulnar F wave latency. Right median motor responses showed moderately decreased CMAP amplitude Electromyography: Selected needle examinations were performed at the right upper, lower extremity muscles. There was evidence of chronic neuropathic changes involving distal right leg muscles. There was no significant abnormalities at right upper extremity muscle. Conclusion: This is an abnormal study.  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  But the degree of abnormality would not explain her profound gait abnormality hyperreflexia bilateral Babinski sign. ------------------------------- Levert Feinstein, M.D. PhD [facility] [address]  [address] [place], [place] [address] Tel: [phone] Fax: [phone] Verbal informed consent was obtained from the patient, patient was informed of potential risk of procedure, including bruising, bleeding, hematoma formation, infection, muscle weakness, muscle pain, numbness, among others.     MNC   Nerve / Sites Muscle Latency Ref. Amplitude Ref. Rel Amp Segments Distance Velocity Ref. Area   ms ms mV mV %  cm m/s m/s mVms R Median - APB    Wrist APB 4.2 ?4.4 2.1 ?4.0 100 Wrist - APB 7   6.3    Upper arm APB 11.2  1.9  94 Upper arm - Wrist 24 34 ?49 7.6 R Ulnar - ADM    Wrist ADM 3.2 ?3.3 5.9 ?6.0 100 Wrist - ADM 7   19.6    B.Elbow ADM 5.4  5.1  87.8 B.Elbow - Wrist 11 51 ?49 18.1    A.Elbow ADM 8.0  4.6  88.9 A.Elbow - B.Elbow 13 50 ?49 17.6 L Peroneal - EDB    Ankle EDB NR ?6.5 NR ?2.0 NR Ankle -  EDB 9   NR        Pop fossa - Ankle     L Tibial - AH    Ankle AH NR ?5.8 NR ?4.0 NR Ankle - AH 9   NR R Tibial - AH    Ankle AH NR ?5.8 NR ?4.0 NR Ankle - AH 9   NR             SNC   Nerve / Sites Rec. Site Peak Lat Ref.  Amp Ref. Segments Distance   ms ms V V  cm R Radial - Anatomical snuff box (Forearm)    Forearm Wrist 2.5 ?2.9 24 ?15 Forearm - Wrist 10 L Sural - Ankle (Calf)    Calf Ankle NR ?4.4 NR ?6 Calf - Ankle 14 R Sural - Ankle (Calf)    Calf Ankle NR ?4.4 NR ?6 Calf - Ankle 14 L Superficial peroneal - Ankle    Lat leg Ankle NR ?4.4 NR ?6 Lat leg - Ankle 14 R Superficial peroneal - Ankle    Lat leg Ankle NR ?4.4 NR ?6 Lat leg - Ankle 14 R Median - Orthodromic (Dig II, Mid palm)    Dig II Wrist 4.5 ?3.4 10 ?10 Dig II - Wrist 13 R Ulnar - Orthodromic, (Dig V, Mid palm)    Dig V Wrist 3.5 ?3.1 10 ?5 Dig V - Wrist 57                 F  Wave   Nerve F Lat Ref.  ms ms R Ulnar - ADM 28.0 ?32.0     EMG Summary Table   Spontaneous MUAP Recruitment Muscle IA Fib PSW Fasc Other Amp Dur. Poly Pattern R. First dorsal interosseous Normal None None None _______ Normal Normal Normal Normal R. Biceps brachii Normal None None  None _______ Normal Normal Normal Normal R. Deltoid Normal None None None _______ Normal Normal Normal Normal R. Triceps brachii Normal None None None _______ Normal Normal Normal Normal R. Tibialis anterior Normal None None None _______ Increased Increased 1+ Reduced R. Tibialis posterior Normal None None None _______ Increased Increased 1+ Reduced R. Peroneus longus Normal None None None _______ Increased Increased 1+ Reduced R. Gastrocnemius (Medial head) Normal None None None _______ Increased Increased 1+ Reduced R. Vastus lateralis Normal None None None _______ Normal Normal Normal Reduced        HISTORY OF PRESENT ILLNESS Ms. Nacona Deel is a 89 y.o. female with history of COPD, HTN, HLD t/w aspirin presenting with witnessed seizure-like activity (+incontinence of bladder, +post-ictal state). Previously on metoprolol and hydrochlorothiazide for HTN, d/c'd in setting of bradycardia. CTH showed Pellegrini ICH in left temporal lobe w/ edema, w/o mass effect, likely nidus. In the past, patient had fallen at work (December 2023) with no LOC, -- head CT workup showed acute IPH in right frontal cortex. Hospital day 4.   HOSPITAL COURSE 9/2: CTH shows L temporal hemorrhage, confirmed with MRI. Placed on IV Keppra prophylaxis. BPs stable.   9/3: EEG negative. Speech: +dysphagia, placed on dysphagia 3 diet. Needs SLP follow-up. 9/4: On regular diet from dysphagia 3. Started ezetimibe 10mg  every day ISO LDL 167 and intolerance of statins. SLP: patient continued to show perseveration and expressive . Social work working with family to choose SNF. 9/5: No change.   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 HA and confusion since Sunday 9/1 Code Stroke CT head:  acute intra-axial hemorrhage in L temporal lobe (3-4 mL) w/edema but w/o significant mass effect. Advanced chronic white matter disease. Right hemisphere chronic  micro-hemorrhages in brain, seen on previous MRI.  CTA head & neck: No emergent large vessel occlusion. No evidence of AVM/aneurism in region of hemorrhage. No dural venous sinus thromboses. 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  CT Venogram: no venous sinus thromboses.  MRI: 2.7 x 1.4 x 1.0 cm intraparenchymal hemorrhage in left temporal lobe, stable. Vasogenic edema present without surrounding mass effect. No underlying mass. Chronic siderosis with a few chronic parenchymal micro hemorrhages clustered about the high posterior right frontoparietal region, consistent with prior hemorrhage at this location. Underlying mild cerebral atrophy with severe chronic microvascular ischemic disease, similar to previous. 2D Echo  LVEF: 60-65%. L atrium normal. No L ventricular dysfunction. Valves normal. LDL 167 HgbA1c 6.0 VTE prophylaxis - heparin subq aspirin 81 mg daily prior to admission, now on No antithrombotic.  Therapy recommendations: SNF Disposition:  No changes in patient status today. Appropriate for discharge, now pending SNF placement logistics. Have requested transfer to hospitalist service -- stroke team will continue to follow.    Seizure 9/2 seizure activity with tonic posturing, eyes open, fixed and not responsive, followed by postictal state.  Loaded with keppra and now on keppra 500 bid LTM EEG: Suggestive of cortical disfunction 2/2 undelrying structural bormality/bleed. Moderate diffuse encephalopathy. No seizures or epileptiform discharges.  Seizure precautions   Hx of Stroke/TIA 05/2023 - fall with left humerus fracture. CT and MRI head showed Prouty right frontal ICH with severe leukoencephalopathy.    Hypertension Home meds:  none Stable, did not need cleviprex, labetalol or other anti-hypertensive medications. Blood Pressure Goal: SBP less than 160  Long term BP goal normotensive   Hyperlipidemia Home meds:  none LDL 167, goal < 70 Not SATURN  candidate as she is not on statin at home Per notes, zocor stopped due to myalgia and pt not tolerating crestor Given current bleeding, gait disorder, hx of intolerance with zocor and crestor, will not initiate statin at this time.  Now on Zetia 10 every day  Continue Zetia on discharge   Dysphagia Patient has post-stroke dysphagia, SLP consulted Was on dys 3 and thin liquid Now advanced to regular diet and thin liquid   Other Stroke Risk Factors Coronary artery disease Advanced age   Other Active Problems Hypokalemia: 3.3, no new labs COPD: Asymptomatic at present, resume home meds and PRN Neuropathy - following with [facility] Dr. Vickey Huger  Cognitive decline - since fall in 11/2022  RN Pressure Injury Documentation:    DISCHARGE EXAM  PHYSICAL EXAM General:  Alert, well-nourished, well-developed patient in no acute distress Psych:  Mood and affect appropriate for situation CV: Regular rate and rhythm on monitor Respiratory:  Regular, unlabored respirations on room air GI: Abdomen soft and nontender  NEURO:  Mental Status: AA&Ox3  Speech/Language: speech is without dysarthria or aphasia.  Naming, repetition, fluency, and comprehension intact.  Cranial Nerves:  III, IV, VI: EOMI. VII: Face is symmetrical resting and smiling VIII: hearing intact to voice.l.  XII: tongue is midline without fasciculations. Motor: LUE: 4/5, no drift. RUE: In cast. Lower extremities: 3/5, no drift, age-appropriate.  Tone: is normal and bulk is normal Sensation: Intact to light touch bilaterally.  Coordination: Deferred Gait: deferred   Discharge Diet       Diet   Diet regular Room service appropriate? Yes with Assist; Fluid consistency: Thin   liquids  DISCHARGE PLAN Disposition: Skilled nursing facility Keppra 500 BID for seizure prophylaxis and ezitimibe 10 mg every day for LDL control. Restart Aspirin 81 therapy on 9/11.  Ongoing stroke risk factor control by Primary Care Physician at  time of discharge Follow-up PCP Penny Cowboy, MD in 2 weeks. Follow-up in [facility] in 8 weeks, office to schedule an appointment. Able to see NP in clinic.  32 minutes were spent preparing discharge.  I have personally obtained history,examined this patient, reviewed notes, independently viewed imaging studies, participated in medical decision making and plan of care.ROS completed by me personally and pertinent positives fully documented  I have made any additions or clarifications directly to the above note. Agree with note above.    Delia Heady, MD Medical Director [facility] Pager: [phone] 08/10/2023 12:29 PM

## 2023-08-10 NOTE — Progress Notes (Signed)
Speech Language Pathology Treatment: Cognitive-Linquistic  Patient Details Name: Penny [name] MRN: [id number] DOB: 1933/08/22 Today's Date: 08/10/2023 Time: 4782-9562 SLP Time Calculation (min) (ACUTE ONLY): 14 min  Assessment / Plan / Recommendation Clinical Impression  Pt's son present in room throughout the session, attempting to assist pt with eating lunch, although pt showing little interest in POs. Attempted to elicit complex conversation with pt, although she appeared less willing to participate today compared to prior sessions. Facilitated cognitive sequencing activity, prompting to pt to describe steps required to make her favorite meal, spaghetti. She required Max prompting to initiate each step and verbal cueing to ensure thorough descriptions. Pt with very minimal verbal output today so no expressive paraphasias noted throughout session, although suspect they persist at more complex levels of conversation. Will continue to follow.    HPI HPI: Penny [name] is an 89 yo female presenting to ED 9/2 after having witnessed seizure-like activity and a period of unresponsiveness and urine incontinence. CTH 9/2 positive for Hosek acute intra-axial hemorrhage in L temporal lobe. Admitted 6/28 following a fall with CTH remarkable for punctate focus of acute IPH in R frontal cortex as well as a L humerus fx. Per daughter, pt has had gradual decline in memory. PMH includes COPD, HLD, HTN      SLP Plan  Continue with current plan of care      Recommendations for follow up therapy are one component of a multi-disciplinary discharge planning process, led by the attending physician.  Recommendations may be updated based on patient status, additional functional criteria and insurance authorization.    Recommendations                     Oral care BID   Frequent or constant Supervision/Assistance Aphasia (R47.01);Cognitive communication deficit (Z30.865)     Continue with current  plan of care     Gwynneth Aliment, M.A., CF-SLP Speech Language Pathology, Acute Rehabilitation Services  Secure Chat preferred [phone]   08/10/2023, 1:06 PM

## 2023-08-10 NOTE — Progress Notes (Signed)
Report called to [facility] LPN

## 2023-08-12 ENCOUNTER — Other Ambulatory Visit: Payer: Self-pay | Admitting: Nurse Practitioner

## 2023-08-12 DIAGNOSIS — Z79899 Other long term (current) drug therapy: Secondary | ICD-10-CM

## 2023-08-12 DIAGNOSIS — F321 Major depressive disorder, single episode, moderate: Secondary | ICD-10-CM

## 2023-08-13 DIAGNOSIS — J449 Chronic obstructive pulmonary disease, unspecified: Secondary | ICD-10-CM | POA: Diagnosis not present

## 2023-08-13 DIAGNOSIS — I69351 Hemiplegia and hemiparesis following cerebral infarction affecting right dominant side: Secondary | ICD-10-CM | POA: Diagnosis not present

## 2023-08-13 DIAGNOSIS — E785 Hyperlipidemia, unspecified: Secondary | ICD-10-CM | POA: Diagnosis not present

## 2023-08-13 DIAGNOSIS — I619 Nontraumatic intracerebral hemorrhage, unspecified: Secondary | ICD-10-CM | POA: Diagnosis not present

## 2023-08-14 ENCOUNTER — Ambulatory Visit
Admission: RE | Admit: 2023-08-14 | Discharge: 2023-08-14 | Disposition: A | Discharge status: Home / Self Care | Payer: Medicare Other | Source: Ambulatory Visit | Attending: Neurology | Admitting: Neurology

## 2023-08-14 DIAGNOSIS — C3410 Malignant neoplasm of upper lobe, unspecified bronchus or lung: Secondary | ICD-10-CM

## 2023-08-14 DIAGNOSIS — R269 Unspecified abnormalities of gait and mobility: Secondary | ICD-10-CM | POA: Diagnosis not present

## 2023-08-14 DIAGNOSIS — R29898 Other symptoms and signs involving the musculoskeletal system: Secondary | ICD-10-CM

## 2023-08-14 DIAGNOSIS — S0990XA Unspecified injury of head, initial encounter: Secondary | ICD-10-CM

## 2023-08-14 DIAGNOSIS — Z85118 Personal history of other malignant neoplasm of bronchus and lung: Secondary | ICD-10-CM | POA: Diagnosis not present

## 2023-08-14 DIAGNOSIS — I619 Nontraumatic intracerebral hemorrhage, unspecified: Secondary | ICD-10-CM | POA: Diagnosis not present

## 2023-08-15 DIAGNOSIS — U071 COVID-19: Secondary | ICD-10-CM | POA: Diagnosis not present

## 2023-08-15 DIAGNOSIS — M6281 Muscle weakness (generalized): Secondary | ICD-10-CM | POA: Diagnosis not present

## 2023-08-15 DIAGNOSIS — I69351 Hemiplegia and hemiparesis following cerebral infarction affecting right dominant side: Secondary | ICD-10-CM | POA: Diagnosis not present

## 2023-08-15 DIAGNOSIS — R2689 Other abnormalities of gait and mobility: Secondary | ICD-10-CM | POA: Diagnosis not present

## 2023-08-15 DIAGNOSIS — R569 Unspecified convulsions: Secondary | ICD-10-CM | POA: Diagnosis not present

## 2023-08-15 DIAGNOSIS — R278 Other lack of coordination: Secondary | ICD-10-CM | POA: Diagnosis not present

## 2023-08-15 DIAGNOSIS — Z9181 History of falling: Secondary | ICD-10-CM | POA: Diagnosis not present

## 2023-08-15 DIAGNOSIS — I619 Nontraumatic intracerebral hemorrhage, unspecified: Secondary | ICD-10-CM | POA: Diagnosis not present

## 2023-08-15 DIAGNOSIS — J449 Chronic obstructive pulmonary disease, unspecified: Secondary | ICD-10-CM | POA: Diagnosis not present

## 2023-08-16 ENCOUNTER — Telehealth: Payer: Self-pay | Admitting: Neurology

## 2023-08-16 DIAGNOSIS — S22000A Wedge compression fracture of unspecified thoracic vertebra, initial encounter for closed fracture: Secondary | ICD-10-CM

## 2023-08-16 DIAGNOSIS — Z9181 History of falling: Secondary | ICD-10-CM

## 2023-08-16 DIAGNOSIS — S0990XA Unspecified injury of head, initial encounter: Secondary | ICD-10-CM

## 2023-08-16 DIAGNOSIS — I619 Nontraumatic intracerebral hemorrhage, unspecified: Secondary | ICD-10-CM | POA: Diagnosis not present

## 2023-08-16 DIAGNOSIS — R269 Unspecified abnormalities of gait and mobility: Secondary | ICD-10-CM

## 2023-08-16 DIAGNOSIS — E785 Hyperlipidemia, unspecified: Secondary | ICD-10-CM | POA: Diagnosis not present

## 2023-08-16 NOTE — Telephone Encounter (Signed)
Please call patient, MRI of thoracic spine showed acute to subacute compression fracture of T12, with 40% vertebral body height loss  There was no evidence of spinal cord compression  If she has significant pain, may consider referring her to interventional radiology for vertebroplasty     MRI thoracic spine (without) demonstrating: - Acute to subacute compression fracture of T12, with 40% loss of vertebral body height.  No associated spinal stenosis or foraminal narrowing. - Enlarged abdominal aorta measuring 3.3 cm in diameter.

## 2023-08-16 NOTE — Telephone Encounter (Signed)
Called the daughter on dpr, results given. Daughter stated that she would like to talk w/Dr. Vickey Huger on Monday and have her weigh in at her appt. I will route to Dohmeier to make her aware

## 2023-08-20 ENCOUNTER — Telehealth: Payer: Self-pay

## 2023-08-20 ENCOUNTER — Encounter: Payer: Self-pay | Admitting: Neurology

## 2023-08-20 ENCOUNTER — Ambulatory Visit: Payer: Medicare Other | Admitting: Neurology

## 2023-08-20 VITALS — BP 127/76 | HR 113 | Ht 62.0 in | Wt 132.0 lb

## 2023-08-20 DIAGNOSIS — R9082 White matter disease, unspecified: Secondary | ICD-10-CM | POA: Diagnosis not present

## 2023-08-20 DIAGNOSIS — I610 Nontraumatic intracerebral hemorrhage in hemisphere, subcortical: Secondary | ICD-10-CM

## 2023-08-20 DIAGNOSIS — S42302D Unspecified fracture of shaft of humerus, left arm, subsequent encounter for fracture with routine healing: Secondary | ICD-10-CM | POA: Diagnosis not present

## 2023-08-20 DIAGNOSIS — R269 Unspecified abnormalities of gait and mobility: Secondary | ICD-10-CM

## 2023-08-20 DIAGNOSIS — J449 Chronic obstructive pulmonary disease, unspecified: Secondary | ICD-10-CM | POA: Diagnosis not present

## 2023-08-20 DIAGNOSIS — E559 Vitamin D deficiency, unspecified: Secondary | ICD-10-CM | POA: Diagnosis not present

## 2023-08-20 DIAGNOSIS — S0990XA Unspecified injury of head, initial encounter: Secondary | ICD-10-CM | POA: Diagnosis not present

## 2023-08-20 NOTE — Patient Instructions (Addendum)
ASSESSMENT AND PLAN 89 y.o. female  here with:       89 y.o. year old female with: 1. ICH - non traumatic   2. Malignant neoplasm of upper lobe of lung, unspecified laterality in 1988 (HCC)   3. Rigidity (muscles) , hyperreflexia ,    4. Traumatic head injury less than 3 months ago, intracerebral hemorrhage.   5. Progressive gait disorder with neuropathy, with CVA and with T12 compression .     I don't see one single factor to cause her gait disorder.      1) fluent speech, intact cognition , presenting after seizure at home,leading to assumption that it was caused by ICH- which has been deemed non traumatic ( but could be traumatic) .   2) ICH ,considered non traumatic , which worsened her gait. More shuffling- more rigor. Needs to have intensive PT 3 times a week. HTN control, low salt diet, but high calcium and potassium.    3) Many falls related to neuropathy , advanced Weidmann vessel disease, and and multifactorial gait disorder.  T12 compression fracture.  I don't see one single factor to cause her gait disorder.    4)Cognitive intact comprehension, but hard of hearing. Will need in home care daily- bid.  Her daughter lives with her. Needs ambulatory assistance.   I like for her potassium to be supplemented by dry prunes or vegetable juice, bananas.    Follow up either through Stroke MD or through me and /or  our NP within 6 months.

## 2023-08-20 NOTE — Telephone Encounter (Signed)
LVM for daughter to return call to evaluate if patient needs visit to order Home Health referral/evaluation.

## 2023-08-20 NOTE — Progress Notes (Addendum)
Provider:  Melvyn Novas, MD  Primary Care Physician:  Lucky Cowboy, MD [address] [place] [address]     Referring Provider: Lucky Cowboy, Md [address] [place],  [place] [address]          Chief Complaint according to patient   Patient presents with:          Patient in room #1 with her son Penny [name]).   Patient had a stroke and possible seizure and is now at [facility].       HISTORY OF PRESENT ILLNESS:  Penny [name] is a 89 y.o. female patient of [place] descent ,who is here for revisit 08/20/2023 for  ICH "stroke" follow up, recent hospitalization. .  Chief concern according to patient's son: still unclear what made her weak, left her unable to walk. Falls a lot.  She woke up with a seizure at 8 AM and was brought by EMS to hospital, 08-06-2023- and was hospitalized for one week, now at [facility].  She was found to have another fall the weekend and she had developed a left ICH, but no right weakness , numbness.  Brain: Relatively mild cerebral atrophy for age. Extensive confluent T2/FLAIR hyperintensity involving the periventricular and deep white matter both cerebral hemispheres, deep gray nuclei, and pons, most likely related to severe chronic microvascular ischemic disease. Appearance is similar to previous.  Previously identified intraparenchymal hemorrhage positioned at the left temporal lobe again seen, relatively stable in size and distribution measuring 2.7 x 1.4 x 1.0 cm (estimated volume 2 mL). Surrounding vasogenic edema without significant regional mass effect. No underlying mass or abnormal enhancement seen at this location.  No other evidence for acute or subacute ischemia. Gray-white matter differentiation otherwise maintained. Chronic siderosis with a few parenchymal micro hemorrhages clustered about the high posterior right frontoparietal region, consistent with prior hemorrhage at this  location (series 8, image 53). This is similar to previous. Few Demilio chronic micro hemorrhages about the cerebellum and right frontal lobe, stable, and likely hypertensive in nature. There is a new chronic microhemorrhage at the left occipital lobe (series 8, image 33).  No mass lesion or midline shift. Ventricular prominence related to global parenchymal volume loss without hydrocephalus. No significant extra-axial fluid collection. Pituitary gland and suprasellar region within normal limits.   Penny [name] is a 89 y.o. female with a past medical history of COPD, hyperlipidemia, hypertension presenting to the emergency department with altered mental status. Patient had seizure like activity witnessed by family, EMS called and she went unresponsive. On EMS arrival patient was unresponsive, incontinent of urine. SpO2 87% and then placed on [place]. She became more alert en route to the hospital. Per family she has been confused for the last couple of days but she has also had a rapid decline in functioning since her fall in December 2023. Her son states that "her legs have not been working." She is not able to ambulate independently, she uses a wheelchair almost exclusively. Prior to December she was still working at [employer], driving, and independent.    She was treated for a fall on 6/28 and workup included a CT head that shows a punctate focus of acute IPH in the right frontal cortex as well as a left humerus fracture.  This fall was thought to be result of her syncopal episode and she followed up with outpatient cardiology and outpatient neurology. She was following with Penny.  Penny [name] with [facility] after her fall. 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  In her last neurology visit on 8/7 is documented that she was found to be alert and oriented, hyperreflexive with bilateral babinski sign, and reporting worsening urinary frequency.  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  At that time her metoprolol and hydrochlorothiazide had recently been changed due to previous episodes of syncope and bradycardia.  She does still follow closely with cardiology and has had her metoprolol permanently discontinued.  She is still on aspirin for aortic atherosclerosis, hyperlipidemia, and coronary calcification.  She has not been on any medications for hypertension.            Review of Systems: Out of a complete 14 system review, the patient complains of only the following symptoms, and all   new onset seizure:    2.7 x 1.4 x 1.0 cm intraparenchymal hemorrhage at the left temporal lobe, stable from prior. Surrounding vasogenic edema without significant regional mass effect. No underlying mass or abnormal enhancement seen at this location. 2. No other acute intracranial abnormality.  T 12 fracture compression fracture , no cord compression.     Neuropathy - see Penny [name] results.   Social History   Socioeconomic History   Marital status: Widowed    Spouse name: Not on file   Number of children: Son Penny [name] ,    Years of education: Not on file   Highest education level: Not on file  Occupational History   Not on file  Tobacco Use   Smoking status: Former    Current packs/day: 0.00    Types: Cigarettes    Quit date: 12/04/1996    Years since quitting: 26.7   Smokeless tobacco: Never  Vaping Use   Vaping status: Never Used  Substance and Sexual Activity   Alcohol use: Yes    Alcohol/week: 1.0 standard drink of alcohol    Types: 1 Glasses of wine per week    Comment: social   Drug use: No   Sexual activity: Not on file  Other Topics Concern   Not on file  Social History Narrative   Not on file   Social Determinants of Health   Financial Resource Strain: Not on  file  Food Insecurity: No Food Insecurity (06/01/2023)   Hunger Vital Sign    Worried About Running Out of Food in the Last Year: Never true    Ran Out of Food in the Last Year: Never true  Transportation Needs: No Transportation Needs (06/01/2023)   PRAPARE - Administrator, Civil Service (Medical): No    Lack of Transportation (Non-Medical): No  Physical Activity: Not on file  Stress: Not on file  Social Connections: Not on file    Family History  Problem Relation Age of Onset   Heart disease Mother    Lung cancer Father    Breast cancer Paternal Aunt     Past Medical History:  Diagnosis Date   COPD (chronic obstructive pulmonary disease) with emphysema (HCC)    COVID-19 12/09/2019   Hyperlipidemia    Hypertension    Lung cancer, upper lobe (HCC) 12/04/1996   RUL   Osteoporosis    S/P partial lobectomy of lung    [id number]  Thigh shingles 12/04/1996   Thoracic aorta atherosclerosis (HCC)    Vitamin D deficiency     Past Surgical History:  Procedure Laterality Date   CARPAL TUNNEL RELEASE Right    2009   LUNG REMOVAL, PARTIAL Right    RUL 1998   OLECRANON BURSECTOMY Left 03/20/2017   Procedure: EXICISION OLECRANON BURSA LEFT ELBOW;  Surgeon: Cindee Salt, MD;  Location: [facility];  Service: Orthopedics;  Laterality: Left;  with scb block in preop   SHOULDER ADHESION RELEASE Right    2004     Current Outpatient Medications on File Prior to Visit  Medication Sig Dispense Refill   acetaminophen (TYLENOL) 325 MG tablet Take 650 mg by mouth as needed for mild pain or moderate pain.     aspirin 81 MG chewable tablet Chew 1 tablet (81 mg total) by mouth every evening.     Budeson-Glycopyrrol-Formoterol (BREZTRI AEROSPHERE) 160-9-4.8 MCG/ACT AERO Inhale 1 puff into the lungs in the morning and at bedtime. 10.7 g 3   buPROPion (WELLBUTRIN) 75 MG tablet TAKE 1 TABLET BY MOUTH DAILY 90 tablet 0   Cholecalciferol 125 MCG (5000 UT) capsule Take 1 capsules  Daily (Patient taking differently: Take 5,000 Units by mouth daily.) 90 capsule 99   Cyanocobalamin (VITAMIN B-12) 1000 MCG SUBL Takes 1 tab SL Daily (Patient taking differently: Take 1 tablet by mouth daily.)  0   diphenhydramine-acetaminophen (TYLENOL PM) 25-500 MG TABS tablet Take 1 tablet by mouth at bedtime as needed (prn).     ezetimibe (ZETIA) 10 MG tablet Take 1 tablet (10 mg total) by mouth daily.     HYDROcodone-acetaminophen (NORCO/VICODIN) 5-325 MG tablet Take 0.5 tablets by mouth every 6 (six) hours as needed for moderate pain.     levETIRAcetam (KEPPRA) 500 MG tablet Take 1 tablet (500 mg total) by mouth 2 (two) times daily. 60 tablet    montelukast (SINGULAIR) 10 MG tablet Take  1 tablet  Daily for Allergies                                                            /                            TAKE                      BY                     MOUTH (Patient taking differently: Take 10 mg by mouth every morning.) 90 tablet 3   pantoprazole (PROTONIX) 40 MG tablet Take 1 tablet (40 mg total) by mouth daily for 15 days. 15 tablet    senna-docusate (SENOKOT-S) 8.6-50 MG tablet Take 1 tablet by mouth 2 (two) times daily. 15 tablet 0   No current facility-administered medications on file prior to visit.    Allergies  Allergen Reactions   Ace Inhibitors Cough   Calcium-Containing Compounds Other (See Comments)    constpation   Lactose Intolerance (Gi) Diarrhea   Polysporin [Bacitracin-Polymyxin B] Rash    Polysporin or neosporin gave her a rash, couldn't remember which     DIAGNOSTIC DATA (LABS, IMAGING, TESTING) - I reviewed patient records, labs, notes, testing  and imaging myself where available.  Lab Results  Component Value Date   WBC 6.7 08/07/2023   HGB 11.6 (L) 08/07/2023   HCT 36.2 08/07/2023   MCV 86.0 08/07/2023   PLT 156 08/07/2023      Component Value Date/Time   NA 139 08/07/2023 1037   K 3.1 (L) 08/07/2023 1037   CL 103 08/07/2023 1037   CO2 26 08/07/2023  1037   GLUCOSE 118 (H) 08/07/2023 1037   BUN 11 08/07/2023 1037   CREATININE 0.61 08/07/2023 1037   CREATININE 0.69 07/05/2023 1605   CALCIUM 8.7 (L) 08/07/2023 1037   PROT 6.7 08/06/2023 0946   ALBUMIN 3.7 08/06/2023 0946   AST 19 08/06/2023 0946   ALT 16 08/06/2023 0946   ALKPHOS 234 (H) 08/06/2023 0946   BILITOT 0.8 08/06/2023 0946   GFRNONAA >60 08/07/2023 1037   GFRNONAA 59 (L) 02/07/2021 1450   GFRAA 68 02/07/2021 1450   Lab Results  Component Value Date   CHOL 231 (H) 08/07/2023   HDL 47 08/07/2023   LDLCALC 167 (H) 08/07/2023   TRIG 83 08/07/2023   CHOLHDL 4.9 08/07/2023   Lab Results  Component Value Date   HGBA1C 5.4 08/07/2023   Lab Results  Component Value Date   VITAMINB12 >2,000 (H) 02/07/2022   Lab Results  Component Value Date   TSH 0.748 08/06/2023    PHYSICAL EXAM:  Today's Vitals   08/20/23 1316  BP: 127/76  Pulse: (!) 113  Weight: 132 lb (59.9 kg)  Height: 5\' 2"  (1.575 m)   Body mass index is 24.14 kg/m.   Wt Readings from Last 3 Encounters:  08/20/23 132 lb (59.9 kg)  08/06/23 138 lb 14.2 oz (63 kg)  07/11/23 139 lb (63 kg)     Ht Readings from Last 3 Encounters:  08/20/23 5\' 2"  (1.575 m)  08/06/23 5\' 2"  (1.575 m)  07/11/23 5\' 2"  (1.575 m)      General: TPupils are equal and briskly reactive to light. Funduscopic exam without  evidence of pallor or edema. Extraocular movements  in vertical and horizontal planes intact and without nystagmus.  Visual fields by finger perimetry are intact. Hearing is severely impaired.   Facial sensation intact to fine touch. Facial motor strength is symmetric and tongue and uvula move midline.   Motor exam:  there is  elevated biceps and wrist  tone and symmetric muscle bulk and symmetric loss of strength in upper extremities. Grip Strength weakness, bilaterally. She has been dragging her feet, left foot points outwards, shuffling gait, needs an associate to help her transfer- she is visibly   stooped, leaning forward. Propulsion risk.  Proximal strength of shoulder muscles and hip flexors was weak.  Her Core-strength is poor, she is leaning to the left. Knees are "sore".  Left leg presented with brisk DTRs and up-going toe.    Sensory:  Fine touch and vibration were tested . Proprioception was tested in the upper extremities only and was normal. Mild tremor on finger to nose.    Coordination: Rapid alternating movements in the fingers/hands were normal.  Finger-to-nose maneuver was tested and showed no evidence of ataxia, dysmetria but mild bilateral tremor.   Gait and station: Patient could not rise unassisted , seated in a wheelchair,  shuffling , wider base.  Stance unsteady, positive romberg- walks with very Diekman steps,  only with assistance did she manage tiny steps. Toe and heel walk were deferred.  Deep tendon reflexes: in the  upper  and lower extremities are symmetric and intact.  Babinski response was up-going.    ASSESSMENT AND PLAN 89 y.o. female  here with:    89 y.o. year old female with: 1. ICH - non traumatic   2. Malignant neoplasm of upper lobe of lung, unspecified laterality in 1988 (HCC)   3. Rigidity (muscles) , hyperreflexia ,    4. Traumatic head injury less than 3 months ago, intracerebral hemorrhage.   5. Progressive gait disorder with neuropathy, with CVA and with T12 compression .        1) fluent speech, intact cognition , presenting after seizure at home,leading to assumption that it was caused by ICH- which has been deemed non traumatic ( but could be traumatic) .  2) ICH ,considered non traumatic , which worsened her gait. More shuffling- more rigor. Needs to have intensive PT 3 times a week. HTN control, low salt diet, but high calcium and potassium.   3) Many falls related to neuropathy , advanced Clute vessel disease, and and multifactorial gait disorder.  T12 compression fracture.  I don't see one single factor to cause her gait  disorder.   4)Cognitive intact comprehension, but hard of hearing. Will need in home care daily- bid.  Her daughter lives with her. Needs ambulatory assistance.  I like for her potassium to be supplemented by dry prunes or vegetable juice, bananas.   I like for her to continue on Keppra 500 mg bid po.   Follow up either through Stroke MD or through me and /or  our NP within 6 months.   I would like to thank Penny Delia Heady, MD, Lucky Cowboy, MD for allowing me to meet with and to take care of this pleasant patient.     After spending a total time of 55 minutes face to face and additional time for physical and neurologic examination, review of laboratory studies,  personal review of imaging studies, reports and results of other testing and review of referral information / records as far as provided in visit,   Electronically signed by: Melvyn Novas, MD 08/20/2023 1:26 PM  [facility] Board certified by The [employer] of Sleep Medicine and Diplomate of the [employer] of Sleep Medicine. Board certified In Neurology through the ABPN, Fellow of the [employer] of Neu[employer].

## 2023-08-22 DIAGNOSIS — U071 COVID-19: Secondary | ICD-10-CM | POA: Diagnosis not present

## 2023-08-22 DIAGNOSIS — R569 Unspecified convulsions: Secondary | ICD-10-CM | POA: Diagnosis not present

## 2023-08-22 DIAGNOSIS — I619 Nontraumatic intracerebral hemorrhage, unspecified: Secondary | ICD-10-CM | POA: Diagnosis not present

## 2023-08-22 DIAGNOSIS — I69351 Hemiplegia and hemiparesis following cerebral infarction affecting right dominant side: Secondary | ICD-10-CM | POA: Diagnosis not present

## 2023-08-22 DIAGNOSIS — M6281 Muscle weakness (generalized): Secondary | ICD-10-CM | POA: Diagnosis not present

## 2023-08-22 DIAGNOSIS — Z9181 History of falling: Secondary | ICD-10-CM | POA: Diagnosis not present

## 2023-08-22 DIAGNOSIS — J449 Chronic obstructive pulmonary disease, unspecified: Secondary | ICD-10-CM | POA: Diagnosis not present

## 2023-08-22 DIAGNOSIS — R278 Other lack of coordination: Secondary | ICD-10-CM | POA: Diagnosis not present

## 2023-08-22 DIAGNOSIS — R2689 Other abnormalities of gait and mobility: Secondary | ICD-10-CM | POA: Diagnosis not present

## 2023-08-23 DIAGNOSIS — I619 Nontraumatic intracerebral hemorrhage, unspecified: Secondary | ICD-10-CM | POA: Diagnosis not present

## 2023-08-23 DIAGNOSIS — E559 Vitamin D deficiency, unspecified: Secondary | ICD-10-CM | POA: Diagnosis not present

## 2023-08-23 DIAGNOSIS — E785 Hyperlipidemia, unspecified: Secondary | ICD-10-CM | POA: Diagnosis not present

## 2023-08-23 DIAGNOSIS — J449 Chronic obstructive pulmonary disease, unspecified: Secondary | ICD-10-CM | POA: Diagnosis not present

## 2023-08-23 NOTE — Progress Notes (Addendum)
Hospital follow up  Assessment and Plan: Hospital visit follow up for:   Penny [name] was seen today for hospitalization follow-up.  Diagnoses and all orders for this visit:  Intracranial hemorrhage (HCC)/Intraparenchymal hematoma of brain, right, without loss of consciousness, subsequent encounter/History of stroke -     CBC with Differential/Platelet Continue with PT/OT through home health Continue to follow with neurology Monitor for symptoms  Essential hypertension - controlled without medication  - Continue DASH diet, exercise with PT/OT and monitor at home. Call if greater than 130/80.   Hyperlipidemia, mixed Restart Ezetimibe 10 mg every day, decrease saturated fats and work on increasing activity with PT -     COMPLETE METABOLIC PANEL WITH GFR -     ezetimibe (ZETIA) 10 MG tablet; Take 1 tablet (10 mg total) by mouth daily.  Seizure Follow with neurology and continue Keppra 500 mg BID   Unsteady gait/Progressive gait disorder Is set up to be seen by home health PT/OT- have not yet been contacted If have not heard by Friday notify our office  Hypokalemia -     COMPLETE METABOLIC PANEL WITH GFR  Pulmonary emphysema, unspecified emphysema type (HCC) Continue inhalers and monitor  Hypomagnesemia -     Magnesium  Compression fracture of T12 vertebra with routine healing, subsequent encounter Use Tylenol and heating pad Work with PT/OT to strengthen muscles   Protein malnutrition Encouraged to increase protein use ensure 1-2 times a day Monitor weight   All medications were reviewed with patient and family and fully reconciled. All questions answered fully, and patient and family members were encouraged to call the office with any further questions or concerns. Discussed goal to avoid readmission related to this diagnosis.    Over 40 minutes of exam, counseling, chart review, and complex, high/moderate level critical decision making was performed this visit.   Future  Appointments  Date Time Provider Department Center  09/04/2023 10:00 AM Raynelle Dick, NP [facility] None  03/11/2024  2:15 PM Ihor Austin, NP [facility] None     HPI 89 y.o.female presents for follow up for transition from recent hospitalization or SNIF stay. Admit date to the hospital was 08/06/23, patient was discharged from the hospital on 08/10/23 and was at rehab for 2 weeks and discharged 08/26/23. The discharge summary, medications, and diagnostic test results were reviewed before meeting with the patient. The patient was admitted for:   ICH - intraparenchymal L temporal lobe Sauve hemorrhage, etiology unclear, could be CAA vs. severe leukoencephalopathy. HA and confusion since Sunday 9/1 Code Stroke CT head: acute intra-axial hemorrhage in L temporal lobe (3-4 mL) w/edema but w/o significant mass effect. Advanced chronic white matter disease. Right hemisphere chronic micro-hemorrhages in brain, seen on previous MRI.  CTA head & neck: No emergent large vessel occlusion. No evidence of AVM/aneurism in region of hemorrhage. No dural venous sinus thromboses. 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  CT Venogram: no venous sinus thromboses.  MRI: 2.7 x 1.4 x 1.0 cm intraparenchymal hemorrhage in left temporal lobe, stable. Vasogenic edema present without surrounding mass effect. No underlying mass. Chronic siderosis with a few chronic parenchymal micro hemorrhages clustered about the high posterior right frontoparietal region, consistent with prior hemorrhage at this location. Underlying mild cerebral atrophy with severe chronic microvascular ischemic disease, similar to previous. 2D Echo  LVEF: 60-65%. L atrium normal. No L ventricular dysfunction. Valves normal. LDL 167 HgbA1c 6.0 VTE prophylaxis - heparin subq aspirin 81 mg daily prior to admission, now on  No antithrombotic.  Therapy recommendations: SNF Disposition:  No changes in patient status today. Appropriate for  discharge, now pending SNF placement logistics. Have requested transfer to hospitalist service -- stroke team will continue to follow.    Seizure 9/2 seizure activity with tonic posturing, eyes open, fixed and not responsive, followed by postictal state.  Loaded with keppra and now on keppra 500 bid LTM EEG: Suggestive of cortical disfunction 2/2 undelrying structural bormality/bleed. Moderate diffuse encephalopathy. No seizures or epileptiform discharges.  Seizure precautions   Hx of Stroke/TIA 05/2023 - fall with left humerus fracture. CT and MRI head showed Shall right frontal ICH with severe leukoencephalopathy.    Hypertension Home meds:  none Stable, did not need cleviprex, labetalol or other anti-hypertensive medications. Blood Pressure Goal: SBP less than 160  Long term BP goal normotensive   Hyperlipidemia Home meds:  none LDL 167, goal < 70 Not SATURN candidate as she is not on statin at home Per notes, zocor stopped due to myalgia and pt not tolerating crestor Given current bleeding, gait disorder, hx of intolerance with zocor and crestor, will not initiate statin at this time.  Now on Zetia 10 every day  Continue Zetia on discharge   Dysphagia Patient has post-stroke dysphagia, SLP consulted Was on dys 3 and thin liquid Now advanced to regular diet and thin liquid   Other Stroke Risk Factors Coronary artery disease Advanced age   Other Active Problems Hypokalemia: 3.3, no new labs COPD: Asymptomatic at present, resume home meds and PRN Neuropathy - following with [facility] Dr. Vickey Huger  Cognitive decline - since fall in 11/2022 Home health is involved. Have not heard from home health as of yet.    She is pleasant and accompanied by her daughter.  Is in wheelchair.  Behavior is appropriate.   Continues on no medications fro BP and BP well controlled. BP Readings from Last 3 Encounters:  08/27/23 138/80  08/20/23 127/76  08/10/23 (!) 164/111    BMI is Body mass  index is 21.69 kg/m., she has not been working on diet and exercise. She ate very little at the rehab. She likes sweets. Eating a little more since being home. Wt Readings from Last 3 Encounters:  08/27/23 118 lb 9.6 oz (53.8 kg)  08/20/23 132 lb (59.9 kg)  08/06/23 138 lb 14.2 oz (63 kg)   For next several weeks daughter is approved to work half day in office and half day at home. Patient is till fairly weak and has mobility difficulties. Using wheelchair now for ambulation. Her left arm fracture is healed, cast is off.   Uncertain of Keppra - do not have prescription for use at home, was given in hospital and rehab.  Had 1 seizure which caused her hospital admission, none further  Lab Results  Component Value Date   CHOL 231 (H) 08/07/2023   HDL 47 08/07/2023   LDLCALC 167 (H) 08/07/2023   TRIG 83 08/07/2023   CHOLHDL 4.9 08/07/2023     Images while in the hospital: MR THORACIC SPINE WO CONTRAST  Result Date: 08/15/2023 Table formatting from the original result was not included. [facility] NEUROIMAGING REPORT STUDY DATE: 08/14/23 PATIENT NAME: Penny [name] DOB: 16-Aug-1933 MRN: [id number] ORDERING CLINICIAN: Levert Feinstein, MD CLINICAL HISTORY: 89 y.o. year old female with: 1. Personal history of lung cancer  2. Malignant neoplasm of upper lobe of lung, unspecified laterality (HCC)  3. Rigidity (muscles)  4. Traumatic head injury less than 3 months ago,  initial encounter  5. Progressive gait disorder   EXAM: MR THORACIC SPINE WO CONTRAST TECHNIQUE: MRI of the thoracic spine was obtained utilizing multiplanar, multiecho pulse sequences. CONTRAST: Diagnostic Product Medications (last 72 hours)   None   COMPARISON: none IMAGING SITE: [facility] AT [address]  [place] [address] FINDINGS: On sagittal views the vertebral bodies have normal height and alignment, except for 40% loss of vertebral body height at T12  with STIR hyperintense signal, may reflect acute/subacute edema. The spinal cord is normal in size and appearance. The paraspinal soft tissues are unremarkable.  On axial views there is no spinal stenosis or foraminal narrowing.  At T11-12 posterior pseudo disc bulging and osteophyte projection with no spinal stenosis or foraminal narrowing. Limited views of the aorta, kidneys, liver, lungs and paraspinal muscles are unremarkable, except for slightly enlarged abdominal aorta measuring 3.3 cm in diameter.   MRI thoracic spine (without) demonstrating: - Acute to subacute compression fracture of T12, with 40% loss of vertebral body height.  No associated spinal stenosis or foraminal narrowing. - Enlarged abdominal aorta measuring 3.3 cm in diameter. INTERPRETING PHYSICIAN: Suanne Marker, MD Certified in Neurology, Neurophysiology and Neuroimaging [facility] [address] [place], [place] [address] [phone]     Current Outpatient Medications (Cardiovascular):    ezetimibe (ZETIA) 10 MG tablet, Take 1 tablet (10 mg total) by mouth daily.  Current Outpatient Medications (Respiratory):    Budeson-Glycopyrrol-Formoterol (BREZTRI AEROSPHERE) 160-9-4.8 MCG/ACT AERO, Inhale 1 puff into the lungs in the morning and at bedtime.   montelukast (SINGULAIR) 10 MG tablet, Take  1 tablet  Daily for Allergies                                                            /                            TAKE                      BY                     MOUTH (Patient taking differently: Take 10 mg by mouth every morning.)  Current Outpatient Medications (Analgesics):    acetaminophen (TYLENOL) 325 MG tablet, Take 650 mg by mouth as needed for mild pain or moderate pain.   aspirin 81 MG chewable tablet, Chew 1 tablet (81 mg total) by mouth every evening.   HYDROcodone-acetaminophen (NORCO/VICODIN) 5-325 MG tablet, Take 0.5 tablets by mouth every 6 (six) hours as needed for moderate pain.  (Patient not taking: Reported on 08/27/2023)  Current Outpatient Medications (Hematological):    Cyanocobalamin (VITAMIN B-12) 1000 MCG SUBL, Takes 1 tab SL Daily (Patient taking differently: Take 1 tablet by mouth daily.)  Current Outpatient Medications (Other):    buPROPion (WELLBUTRIN) 75 MG tablet, TAKE 1 TABLET BY MOUTH DAILY   Cholecalciferol 125 MCG (5000 UT) capsule, Take 1 capsules Daily (Patient taking differently: Take 5,000 Units by mouth daily.)   diphenhydramine-acetaminophen (TYLENOL PM) 25-500 MG TABS tablet, Take 1 tablet by mouth at bedtime as needed (prn).   levETIRAcetam (KEPPRA) 500 MG tablet, Take 1 tablet (500 mg  total) by mouth 2 (two) times daily.   senna-docusate (SENOKOT-S) 8.6-50 MG tablet, Take 1 tablet by mouth 2 (two) times daily.   pantoprazole (PROTONIX) 40 MG tablet, Take 1 tablet (40 mg total) by mouth daily for 15 days.  Past Medical History:  Diagnosis Date   COPD (chronic obstructive pulmonary disease) with emphysema (HCC)    COVID-19 12/09/2019   Hyperlipidemia    Hypertension    Lung cancer, upper lobe (HCC) 12/04/1996   RUL   Osteoporosis    S/P partial lobectomy of lung    [id number]   Thigh shingles 12/04/1996   Thoracic aorta atherosclerosis (HCC)    Vitamin D deficiency      Allergies  Allergen Reactions   Ace Inhibitors Cough   Calcium-Containing Compounds Other (See Comments)    constpation   Lactose Intolerance (Gi) Diarrhea   Polysporin [Bacitracin-Polymyxin B] Rash    Polysporin or neosporin gave her a rash, couldn't remember which    ROS: all negative except above.   Physical Exam: Filed Weights   08/27/23 1107  Weight: 118 lb 9.6 oz (53.8 kg)   BP 138/80   Pulse 100   Temp 97.9 F (36.6 C)   Resp 17   Ht 5\' 2"  (1.575 m)   Wt 118 lb 9.6 oz (53.8 kg)   SpO2 98%   BMI 21.69 kg/m  General Appearance: Thin, frail, alert and very pleasant. Eyes: PERRLA, EOMs, conjunctiva no swelling or erythema Sinuses: No  Frontal/maxillary tenderness ENT/Mouth: Ext aud canals clear, TMs without erythema, bulging. No erythema, swelling, or exudate on post pharynx.  Hearing normal.  Neck: Supple, thyroid normal.  Respiratory: Respiratory effort normal, BS equal bilaterally without rales, rhonchi, wheezing or stridor.  Cardio: RRR with no MRGs. Brisk peripheral pulses without edema.  Abdomen: Soft, + BS.  Non tender, no guarding, rebound, hernias, masses. Lymphatics: Non tender without lymphadenopathy.  Musculoskeletal: Full ROM, 4/5 of upper arms. Unable to ambulate, in walker Skin: Warm, dry , bruising of left upper arm Neuro: Cranial nerves intact. Core strength decreased.  Psych: Awake, normal affect, Behavior is appropriate.    Please evaluate Penny [name] for admission to [facility].  Disciplines requested: Physical Therapy and Occupational Therapy  Services to provide: Strengthening Exercises  Physician to follow patient's care (the person listed here will be responsible for signing ongoing orders): PCP  Requested Start of Care Date: Within 7 days  I certify that this patient is under my care and that I, or a Nurse Practitioner or Physician's Assistant working with me, had a face-to-face encounter that meets the physician face-to-face requirements with patient on 08/27/23. The encounter with the patient was in whole, or in part for the following medical condition(s) which is the primary reason for home health care (List medical condition). Intracranial hemorrhage  Special Instructions:  Requires PT/OT to build strength and functioning Penny [name] Hollie Salk, NP 11:16 AM [facility]

## 2023-08-27 ENCOUNTER — Encounter: Payer: Self-pay | Admitting: Nurse Practitioner

## 2023-08-27 ENCOUNTER — Ambulatory Visit (INDEPENDENT_AMBULATORY_CARE_PROVIDER_SITE_OTHER): Payer: Medicare Other | Admitting: Nurse Practitioner

## 2023-08-27 VITALS — BP 138/80 | HR 100 | Temp 97.9°F | Resp 17 | Ht 62.0 in | Wt 118.6 lb

## 2023-08-27 DIAGNOSIS — R1310 Dysphagia, unspecified: Secondary | ICD-10-CM

## 2023-08-27 DIAGNOSIS — E876 Hypokalemia: Secondary | ICD-10-CM

## 2023-08-27 DIAGNOSIS — S06310D Contusion and laceration of right cerebrum without loss of consciousness, subsequent encounter: Secondary | ICD-10-CM

## 2023-08-27 DIAGNOSIS — R2681 Unsteadiness on feet: Secondary | ICD-10-CM

## 2023-08-27 DIAGNOSIS — R4189 Other symptoms and signs involving cognitive functions and awareness: Secondary | ICD-10-CM

## 2023-08-27 DIAGNOSIS — R569 Unspecified convulsions: Secondary | ICD-10-CM

## 2023-08-27 DIAGNOSIS — I629 Nontraumatic intracranial hemorrhage, unspecified: Secondary | ICD-10-CM

## 2023-08-27 DIAGNOSIS — G629 Polyneuropathy, unspecified: Secondary | ICD-10-CM

## 2023-08-27 DIAGNOSIS — I10 Essential (primary) hypertension: Secondary | ICD-10-CM

## 2023-08-27 DIAGNOSIS — J439 Emphysema, unspecified: Secondary | ICD-10-CM | POA: Diagnosis not present

## 2023-08-27 DIAGNOSIS — E782 Mixed hyperlipidemia: Secondary | ICD-10-CM | POA: Diagnosis not present

## 2023-08-27 DIAGNOSIS — Z8673 Personal history of transient ischemic attack (TIA), and cerebral infarction without residual deficits: Secondary | ICD-10-CM | POA: Diagnosis not present

## 2023-08-27 DIAGNOSIS — R269 Unspecified abnormalities of gait and mobility: Secondary | ICD-10-CM

## 2023-08-27 DIAGNOSIS — E46 Unspecified protein-calorie malnutrition: Secondary | ICD-10-CM

## 2023-08-27 DIAGNOSIS — S22080D Wedge compression fracture of T11-T12 vertebra, subsequent encounter for fracture with routine healing: Secondary | ICD-10-CM

## 2023-08-27 MED ORDER — LEVETIRACETAM 500 MG PO TABS
500.0000 mg | ORAL_TABLET | Freq: Two times a day (BID) | ORAL | 2 refills | Status: DC
Start: 2023-08-27 — End: 2023-09-10

## 2023-08-27 MED ORDER — EZETIMIBE 10 MG PO TABS
10.0000 mg | ORAL_TABLET | Freq: Every day | ORAL | 2 refills | Status: DC
Start: 2023-08-27 — End: 2024-01-21

## 2023-08-27 NOTE — Patient Instructions (Addendum)
Continue  Aspirin 81 mg every other day.  Breztri every morning 1 inhalation Vit D  5000 and Vit B12 1000 daily Wellbutrin 75 mg Daily Singulair (montelukast) 10 mg - take at bedtime Colace once a day Ezetimibe 10 mg daily  I will be calling Dr. Vickey Huger about Keppra   Increase protein and nutrient rich foods

## 2023-08-28 LAB — CBC WITH DIFFERENTIAL/PLATELET
Absolute Monocytes: 672 cells/uL (ref 200–950)
Basophils Absolute: 30 cells/uL (ref 0–200)
Basophils Relative: 0.5 %
Eosinophils Absolute: 60 cells/uL (ref 15–500)
Eosinophils Relative: 1 %
HCT: 42.3 % (ref 35.0–45.0)
Hemoglobin: 13.4 g/dL (ref 11.7–15.5)
Lymphs Abs: 1326 cells/uL (ref 850–3900)
MCH: 26.9 pg — ABNORMAL LOW (ref 27.0–33.0)
MCHC: 31.7 g/dL — ABNORMAL LOW (ref 32.0–36.0)
MCV: 84.9 fL (ref 80.0–100.0)
MPV: 10.9 fL (ref 7.5–12.5)
Monocytes Relative: 11.2 %
Neutro Abs: 3912 cells/uL (ref 1500–7800)
Neutrophils Relative %: 65.2 %
Platelets: 236 10*3/uL (ref 140–400)
RBC: 4.98 10*6/uL (ref 3.80–5.10)
RDW: 13.4 % (ref 11.0–15.0)
Total Lymphocyte: 22.1 %
WBC: 6 10*3/uL (ref 3.8–10.8)

## 2023-08-28 LAB — COMPLETE METABOLIC PANEL WITH GFR
AG Ratio: 1.7 (calc) (ref 1.0–2.5)
ALT: 13 U/L (ref 6–29)
AST: 16 U/L (ref 10–35)
Albumin: 4 g/dL (ref 3.6–5.1)
Alkaline phosphatase (APISO): 219 U/L — ABNORMAL HIGH (ref 37–153)
BUN: 11 mg/dL (ref 7–25)
CO2: 31 mmol/L (ref 20–32)
Calcium: 9.8 mg/dL (ref 8.6–10.4)
Chloride: 103 mmol/L (ref 98–110)
Creat: 0.69 mg/dL (ref 0.60–0.95)
Globulin: 2.4 g/dL (calc) (ref 1.9–3.7)
Glucose, Bld: 102 mg/dL — ABNORMAL HIGH (ref 65–99)
Potassium: 3.9 mmol/L (ref 3.5–5.3)
Sodium: 143 mmol/L (ref 135–146)
Total Bilirubin: 0.6 mg/dL (ref 0.2–1.2)
Total Protein: 6.4 g/dL (ref 6.1–8.1)
eGFR: 83 mL/min/{1.73_m2} (ref >=60)

## 2023-08-28 LAB — MAGNESIUM: Magnesium: 1.5 mg/dL (ref 1.5–2.5)

## 2023-08-30 NOTE — Telephone Encounter (Signed)
Left VM and offered KYPHOPLASTY.

## 2023-09-03 ENCOUNTER — Other Ambulatory Visit: Payer: Self-pay | Admitting: Nurse Practitioner

## 2023-09-03 ENCOUNTER — Telehealth: Payer: Self-pay | Admitting: Nurse Practitioner

## 2023-09-03 DIAGNOSIS — I629 Nontraumatic intracranial hemorrhage, unspecified: Secondary | ICD-10-CM

## 2023-09-03 DIAGNOSIS — R2681 Unsteadiness on feet: Secondary | ICD-10-CM

## 2023-09-03 DIAGNOSIS — S06310D Contusion and laceration of right cerebrum without loss of consciousness, subsequent encounter: Secondary | ICD-10-CM

## 2023-09-03 DIAGNOSIS — Z8673 Personal history of transient ischemic attack (TIA), and cerebral infarction without residual deficits: Secondary | ICD-10-CM

## 2023-09-03 NOTE — Telephone Encounter (Signed)
Please start the Keppra twice a day, I called in to pharmacy on 08/27/23. I have placed home health PT order

## 2023-09-03 NOTE — Telephone Encounter (Signed)
Daughter has called back to report that they would like to move forward with the KYPHOPLASTY

## 2023-09-03 NOTE — Telephone Encounter (Signed)
Pt's daughter was following up, she was wondering if you thought it was a good idea fr pt to start back on anti-seizure med? Also physical therapy still has not reached out.

## 2023-09-04 ENCOUNTER — Encounter: Payer: Medicare Other | Admitting: Nurse Practitioner

## 2023-09-04 NOTE — Telephone Encounter (Signed)
Left message on daughters phone to restart the Keppra twice a day and if she has any questions to give [place] a call.

## 2023-09-06 DIAGNOSIS — I69398 Other sequelae of cerebral infarction: Secondary | ICD-10-CM | POA: Diagnosis not present

## 2023-09-06 DIAGNOSIS — I708 Atherosclerosis of other arteries: Secondary | ICD-10-CM | POA: Diagnosis not present

## 2023-09-06 DIAGNOSIS — E46 Unspecified protein-calorie malnutrition: Secondary | ICD-10-CM | POA: Diagnosis not present

## 2023-09-06 DIAGNOSIS — Z7982 Long term (current) use of aspirin: Secondary | ICD-10-CM | POA: Diagnosis not present

## 2023-09-06 DIAGNOSIS — S22088D Other fracture of T11-T12 vertebra, subsequent encounter for fracture with routine healing: Secondary | ICD-10-CM | POA: Diagnosis not present

## 2023-09-06 DIAGNOSIS — I10 Essential (primary) hypertension: Secondary | ICD-10-CM | POA: Diagnosis not present

## 2023-09-06 DIAGNOSIS — J439 Emphysema, unspecified: Secondary | ICD-10-CM | POA: Diagnosis not present

## 2023-09-06 DIAGNOSIS — E782 Mixed hyperlipidemia: Secondary | ICD-10-CM | POA: Diagnosis not present

## 2023-09-06 DIAGNOSIS — I69391 Dysphagia following cerebral infarction: Secondary | ICD-10-CM | POA: Diagnosis not present

## 2023-09-06 DIAGNOSIS — R2689 Other abnormalities of gait and mobility: Secondary | ICD-10-CM | POA: Diagnosis not present

## 2023-09-06 DIAGNOSIS — Z8616 Personal history of COVID-19: Secondary | ICD-10-CM | POA: Diagnosis not present

## 2023-09-06 DIAGNOSIS — R569 Unspecified convulsions: Secondary | ICD-10-CM | POA: Diagnosis not present

## 2023-09-06 DIAGNOSIS — Z905 Acquired absence of kidney: Secondary | ICD-10-CM | POA: Diagnosis not present

## 2023-09-06 DIAGNOSIS — I2510 Atherosclerotic heart disease of native coronary artery without angina pectoris: Secondary | ICD-10-CM | POA: Diagnosis not present

## 2023-09-06 DIAGNOSIS — E876 Hypokalemia: Secondary | ICD-10-CM | POA: Diagnosis not present

## 2023-09-06 DIAGNOSIS — Z85118 Personal history of other malignant neoplasm of bronchus and lung: Secondary | ICD-10-CM | POA: Diagnosis not present

## 2023-09-06 DIAGNOSIS — R1310 Dysphagia, unspecified: Secondary | ICD-10-CM | POA: Diagnosis not present

## 2023-09-07 NOTE — Addendum Note (Signed)
Addended by: Melvyn Novas on: 09/07/2023 11:47 AM   Modules accepted: Orders

## 2023-09-10 ENCOUNTER — Other Ambulatory Visit: Payer: Self-pay

## 2023-09-10 DIAGNOSIS — R569 Unspecified convulsions: Secondary | ICD-10-CM

## 2023-09-10 MED ORDER — LEVETIRACETAM 500 MG PO TABS: 500.0000 mg | ORAL_TABLET | Freq: Two times a day (BID) | ORAL | 2 refills | Status: DC

## 2023-09-11 DIAGNOSIS — I69391 Dysphagia following cerebral infarction: Secondary | ICD-10-CM | POA: Diagnosis not present

## 2023-09-11 DIAGNOSIS — E782 Mixed hyperlipidemia: Secondary | ICD-10-CM | POA: Diagnosis not present

## 2023-09-11 DIAGNOSIS — I2510 Atherosclerotic heart disease of native coronary artery without angina pectoris: Secondary | ICD-10-CM | POA: Diagnosis not present

## 2023-09-11 DIAGNOSIS — R1310 Dysphagia, unspecified: Secondary | ICD-10-CM | POA: Diagnosis not present

## 2023-09-11 DIAGNOSIS — R569 Unspecified convulsions: Secondary | ICD-10-CM | POA: Diagnosis not present

## 2023-09-11 DIAGNOSIS — E876 Hypokalemia: Secondary | ICD-10-CM | POA: Diagnosis not present

## 2023-09-11 DIAGNOSIS — E46 Unspecified protein-calorie malnutrition: Secondary | ICD-10-CM | POA: Diagnosis not present

## 2023-09-11 DIAGNOSIS — Z7982 Long term (current) use of aspirin: Secondary | ICD-10-CM | POA: Diagnosis not present

## 2023-09-11 DIAGNOSIS — I10 Essential (primary) hypertension: Secondary | ICD-10-CM | POA: Diagnosis not present

## 2023-09-11 DIAGNOSIS — J439 Emphysema, unspecified: Secondary | ICD-10-CM | POA: Diagnosis not present

## 2023-09-11 DIAGNOSIS — Z85118 Personal history of other malignant neoplasm of bronchus and lung: Secondary | ICD-10-CM | POA: Diagnosis not present

## 2023-09-11 DIAGNOSIS — I708 Atherosclerosis of other arteries: Secondary | ICD-10-CM | POA: Diagnosis not present

## 2023-09-11 DIAGNOSIS — R2689 Other abnormalities of gait and mobility: Secondary | ICD-10-CM | POA: Diagnosis not present

## 2023-09-11 DIAGNOSIS — S22088D Other fracture of T11-T12 vertebra, subsequent encounter for fracture with routine healing: Secondary | ICD-10-CM | POA: Diagnosis not present

## 2023-09-11 DIAGNOSIS — Z8616 Personal history of COVID-19: Secondary | ICD-10-CM | POA: Diagnosis not present

## 2023-09-11 DIAGNOSIS — Z905 Acquired absence of kidney: Secondary | ICD-10-CM | POA: Diagnosis not present

## 2023-09-11 DIAGNOSIS — I69398 Other sequelae of cerebral infarction: Secondary | ICD-10-CM | POA: Diagnosis not present

## 2023-09-13 DIAGNOSIS — Z905 Acquired absence of kidney: Secondary | ICD-10-CM | POA: Diagnosis not present

## 2023-09-13 DIAGNOSIS — Z7982 Long term (current) use of aspirin: Secondary | ICD-10-CM | POA: Diagnosis not present

## 2023-09-13 DIAGNOSIS — R1310 Dysphagia, unspecified: Secondary | ICD-10-CM | POA: Diagnosis not present

## 2023-09-13 DIAGNOSIS — E46 Unspecified protein-calorie malnutrition: Secondary | ICD-10-CM | POA: Diagnosis not present

## 2023-09-13 DIAGNOSIS — S22088D Other fracture of T11-T12 vertebra, subsequent encounter for fracture with routine healing: Secondary | ICD-10-CM | POA: Diagnosis not present

## 2023-09-13 DIAGNOSIS — I708 Atherosclerosis of other arteries: Secondary | ICD-10-CM | POA: Diagnosis not present

## 2023-09-13 DIAGNOSIS — I10 Essential (primary) hypertension: Secondary | ICD-10-CM | POA: Diagnosis not present

## 2023-09-13 DIAGNOSIS — I69391 Dysphagia following cerebral infarction: Secondary | ICD-10-CM | POA: Diagnosis not present

## 2023-09-13 DIAGNOSIS — E876 Hypokalemia: Secondary | ICD-10-CM | POA: Diagnosis not present

## 2023-09-13 DIAGNOSIS — J439 Emphysema, unspecified: Secondary | ICD-10-CM | POA: Diagnosis not present

## 2023-09-13 DIAGNOSIS — R2689 Other abnormalities of gait and mobility: Secondary | ICD-10-CM | POA: Diagnosis not present

## 2023-09-13 DIAGNOSIS — E782 Mixed hyperlipidemia: Secondary | ICD-10-CM | POA: Diagnosis not present

## 2023-09-13 DIAGNOSIS — Z8616 Personal history of COVID-19: Secondary | ICD-10-CM | POA: Diagnosis not present

## 2023-09-13 DIAGNOSIS — R569 Unspecified convulsions: Secondary | ICD-10-CM | POA: Diagnosis not present

## 2023-09-13 DIAGNOSIS — I69398 Other sequelae of cerebral infarction: Secondary | ICD-10-CM | POA: Diagnosis not present

## 2023-09-13 DIAGNOSIS — I2510 Atherosclerotic heart disease of native coronary artery without angina pectoris: Secondary | ICD-10-CM | POA: Diagnosis not present

## 2023-09-13 DIAGNOSIS — Z85118 Personal history of other malignant neoplasm of bronchus and lung: Secondary | ICD-10-CM | POA: Diagnosis not present

## 2023-09-19 DIAGNOSIS — Z7982 Long term (current) use of aspirin: Secondary | ICD-10-CM | POA: Diagnosis not present

## 2023-09-19 DIAGNOSIS — I10 Essential (primary) hypertension: Secondary | ICD-10-CM | POA: Diagnosis not present

## 2023-09-19 DIAGNOSIS — Z8616 Personal history of COVID-19: Secondary | ICD-10-CM | POA: Diagnosis not present

## 2023-09-19 DIAGNOSIS — E876 Hypokalemia: Secondary | ICD-10-CM | POA: Diagnosis not present

## 2023-09-19 DIAGNOSIS — I708 Atherosclerosis of other arteries: Secondary | ICD-10-CM | POA: Diagnosis not present

## 2023-09-19 DIAGNOSIS — S22088D Other fracture of T11-T12 vertebra, subsequent encounter for fracture with routine healing: Secondary | ICD-10-CM | POA: Diagnosis not present

## 2023-09-19 DIAGNOSIS — R1310 Dysphagia, unspecified: Secondary | ICD-10-CM | POA: Diagnosis not present

## 2023-09-19 DIAGNOSIS — Z85118 Personal history of other malignant neoplasm of bronchus and lung: Secondary | ICD-10-CM | POA: Diagnosis not present

## 2023-09-19 DIAGNOSIS — R2689 Other abnormalities of gait and mobility: Secondary | ICD-10-CM | POA: Diagnosis not present

## 2023-09-19 DIAGNOSIS — I69391 Dysphagia following cerebral infarction: Secondary | ICD-10-CM | POA: Diagnosis not present

## 2023-09-19 DIAGNOSIS — R569 Unspecified convulsions: Secondary | ICD-10-CM | POA: Diagnosis not present

## 2023-09-19 DIAGNOSIS — E46 Unspecified protein-calorie malnutrition: Secondary | ICD-10-CM | POA: Diagnosis not present

## 2023-09-19 DIAGNOSIS — I2510 Atherosclerotic heart disease of native coronary artery without angina pectoris: Secondary | ICD-10-CM | POA: Diagnosis not present

## 2023-09-19 DIAGNOSIS — I69398 Other sequelae of cerebral infarction: Secondary | ICD-10-CM | POA: Diagnosis not present

## 2023-09-19 DIAGNOSIS — J439 Emphysema, unspecified: Secondary | ICD-10-CM | POA: Diagnosis not present

## 2023-09-19 DIAGNOSIS — E782 Mixed hyperlipidemia: Secondary | ICD-10-CM | POA: Diagnosis not present

## 2023-09-19 DIAGNOSIS — M6281 Muscle weakness (generalized): Secondary | ICD-10-CM | POA: Diagnosis not present

## 2023-09-19 DIAGNOSIS — Z905 Acquired absence of kidney: Secondary | ICD-10-CM | POA: Diagnosis not present

## 2023-09-21 DIAGNOSIS — I69391 Dysphagia following cerebral infarction: Secondary | ICD-10-CM | POA: Diagnosis not present

## 2023-09-21 DIAGNOSIS — Z7982 Long term (current) use of aspirin: Secondary | ICD-10-CM | POA: Diagnosis not present

## 2023-09-21 DIAGNOSIS — R2689 Other abnormalities of gait and mobility: Secondary | ICD-10-CM | POA: Diagnosis not present

## 2023-09-21 DIAGNOSIS — E782 Mixed hyperlipidemia: Secondary | ICD-10-CM | POA: Diagnosis not present

## 2023-09-21 DIAGNOSIS — R569 Unspecified convulsions: Secondary | ICD-10-CM | POA: Diagnosis not present

## 2023-09-21 DIAGNOSIS — I708 Atherosclerosis of other arteries: Secondary | ICD-10-CM | POA: Diagnosis not present

## 2023-09-21 DIAGNOSIS — Z85118 Personal history of other malignant neoplasm of bronchus and lung: Secondary | ICD-10-CM | POA: Diagnosis not present

## 2023-09-21 DIAGNOSIS — J439 Emphysema, unspecified: Secondary | ICD-10-CM | POA: Diagnosis not present

## 2023-09-21 DIAGNOSIS — Z8616 Personal history of COVID-19: Secondary | ICD-10-CM | POA: Diagnosis not present

## 2023-09-21 DIAGNOSIS — I2510 Atherosclerotic heart disease of native coronary artery without angina pectoris: Secondary | ICD-10-CM | POA: Diagnosis not present

## 2023-09-21 DIAGNOSIS — E46 Unspecified protein-calorie malnutrition: Secondary | ICD-10-CM | POA: Diagnosis not present

## 2023-09-21 DIAGNOSIS — I69398 Other sequelae of cerebral infarction: Secondary | ICD-10-CM | POA: Diagnosis not present

## 2023-09-21 DIAGNOSIS — M6281 Muscle weakness (generalized): Secondary | ICD-10-CM | POA: Diagnosis not present

## 2023-09-21 DIAGNOSIS — Z905 Acquired absence of kidney: Secondary | ICD-10-CM | POA: Diagnosis not present

## 2023-09-21 DIAGNOSIS — E876 Hypokalemia: Secondary | ICD-10-CM | POA: Diagnosis not present

## 2023-09-21 DIAGNOSIS — S22088D Other fracture of T11-T12 vertebra, subsequent encounter for fracture with routine healing: Secondary | ICD-10-CM | POA: Diagnosis not present

## 2023-09-21 DIAGNOSIS — I10 Essential (primary) hypertension: Secondary | ICD-10-CM | POA: Diagnosis not present

## 2023-09-21 DIAGNOSIS — R1310 Dysphagia, unspecified: Secondary | ICD-10-CM | POA: Diagnosis not present

## 2023-09-27 ENCOUNTER — Other Ambulatory Visit: Payer: Self-pay | Admitting: Neurology

## 2023-09-27 ENCOUNTER — Telehealth: Payer: Self-pay | Admitting: Neurology

## 2023-09-27 NOTE — Telephone Encounter (Signed)
I forwarded this to the IR schedulers and I sent Victorino Dike a teams message but she is red right now. Their phone number is [phone]

## 2023-09-27 NOTE — Telephone Encounter (Signed)
Pt's daughter, Adin Hector calling to check on status of referral for Kyphoplasty. Would like a call from the nurse.

## 2023-09-28 ENCOUNTER — Other Ambulatory Visit: Payer: Self-pay | Admitting: Neurology

## 2023-09-28 DIAGNOSIS — R269 Unspecified abnormalities of gait and mobility: Secondary | ICD-10-CM

## 2023-09-28 DIAGNOSIS — I69398 Other sequelae of cerebral infarction: Secondary | ICD-10-CM | POA: Diagnosis not present

## 2023-09-28 DIAGNOSIS — I10 Essential (primary) hypertension: Secondary | ICD-10-CM | POA: Diagnosis not present

## 2023-09-28 DIAGNOSIS — R1310 Dysphagia, unspecified: Secondary | ICD-10-CM | POA: Diagnosis not present

## 2023-09-28 DIAGNOSIS — S22088D Other fracture of T11-T12 vertebra, subsequent encounter for fracture with routine healing: Secondary | ICD-10-CM | POA: Diagnosis not present

## 2023-09-28 DIAGNOSIS — Z905 Acquired absence of kidney: Secondary | ICD-10-CM | POA: Diagnosis not present

## 2023-09-28 DIAGNOSIS — Z8616 Personal history of COVID-19: Secondary | ICD-10-CM | POA: Diagnosis not present

## 2023-09-28 DIAGNOSIS — Z9181 History of falling: Secondary | ICD-10-CM

## 2023-09-28 DIAGNOSIS — E46 Unspecified protein-calorie malnutrition: Secondary | ICD-10-CM | POA: Diagnosis not present

## 2023-09-28 DIAGNOSIS — R2689 Other abnormalities of gait and mobility: Secondary | ICD-10-CM | POA: Diagnosis not present

## 2023-09-28 DIAGNOSIS — S0990XA Unspecified injury of head, initial encounter: Secondary | ICD-10-CM

## 2023-09-28 DIAGNOSIS — I2510 Atherosclerotic heart disease of native coronary artery without angina pectoris: Secondary | ICD-10-CM | POA: Diagnosis not present

## 2023-09-28 DIAGNOSIS — I708 Atherosclerosis of other arteries: Secondary | ICD-10-CM | POA: Diagnosis not present

## 2023-09-28 DIAGNOSIS — I69391 Dysphagia following cerebral infarction: Secondary | ICD-10-CM | POA: Diagnosis not present

## 2023-09-28 DIAGNOSIS — R569 Unspecified convulsions: Secondary | ICD-10-CM | POA: Diagnosis not present

## 2023-09-28 DIAGNOSIS — E782 Mixed hyperlipidemia: Secondary | ICD-10-CM | POA: Diagnosis not present

## 2023-09-28 DIAGNOSIS — J439 Emphysema, unspecified: Secondary | ICD-10-CM | POA: Diagnosis not present

## 2023-09-28 DIAGNOSIS — E876 Hypokalemia: Secondary | ICD-10-CM | POA: Diagnosis not present

## 2023-09-28 DIAGNOSIS — Z7982 Long term (current) use of aspirin: Secondary | ICD-10-CM | POA: Diagnosis not present

## 2023-09-28 DIAGNOSIS — M6281 Muscle weakness (generalized): Secondary | ICD-10-CM | POA: Diagnosis not present

## 2023-09-28 DIAGNOSIS — Z85118 Personal history of other malignant neoplasm of bronchus and lung: Secondary | ICD-10-CM | POA: Diagnosis not present

## 2023-09-28 DIAGNOSIS — S22000A Wedge compression fracture of unspecified thoracic vertebra, initial encounter for closed fracture: Secondary | ICD-10-CM

## 2023-10-01 DIAGNOSIS — Z85118 Personal history of other malignant neoplasm of bronchus and lung: Secondary | ICD-10-CM | POA: Diagnosis not present

## 2023-10-01 DIAGNOSIS — M6281 Muscle weakness (generalized): Secondary | ICD-10-CM | POA: Diagnosis not present

## 2023-10-01 DIAGNOSIS — Z905 Acquired absence of kidney: Secondary | ICD-10-CM | POA: Diagnosis not present

## 2023-10-01 DIAGNOSIS — E782 Mixed hyperlipidemia: Secondary | ICD-10-CM | POA: Diagnosis not present

## 2023-10-01 DIAGNOSIS — R569 Unspecified convulsions: Secondary | ICD-10-CM | POA: Diagnosis not present

## 2023-10-01 DIAGNOSIS — E46 Unspecified protein-calorie malnutrition: Secondary | ICD-10-CM | POA: Diagnosis not present

## 2023-10-01 DIAGNOSIS — I69398 Other sequelae of cerebral infarction: Secondary | ICD-10-CM | POA: Diagnosis not present

## 2023-10-01 DIAGNOSIS — R2689 Other abnormalities of gait and mobility: Secondary | ICD-10-CM | POA: Diagnosis not present

## 2023-10-01 DIAGNOSIS — E876 Hypokalemia: Secondary | ICD-10-CM | POA: Diagnosis not present

## 2023-10-01 DIAGNOSIS — Z8616 Personal history of COVID-19: Secondary | ICD-10-CM | POA: Diagnosis not present

## 2023-10-01 DIAGNOSIS — I10 Essential (primary) hypertension: Secondary | ICD-10-CM | POA: Diagnosis not present

## 2023-10-01 DIAGNOSIS — I69391 Dysphagia following cerebral infarction: Secondary | ICD-10-CM | POA: Diagnosis not present

## 2023-10-01 DIAGNOSIS — S22088D Other fracture of T11-T12 vertebra, subsequent encounter for fracture with routine healing: Secondary | ICD-10-CM | POA: Diagnosis not present

## 2023-10-01 DIAGNOSIS — R1310 Dysphagia, unspecified: Secondary | ICD-10-CM | POA: Diagnosis not present

## 2023-10-01 DIAGNOSIS — Z7982 Long term (current) use of aspirin: Secondary | ICD-10-CM | POA: Diagnosis not present

## 2023-10-01 DIAGNOSIS — J439 Emphysema, unspecified: Secondary | ICD-10-CM | POA: Diagnosis not present

## 2023-10-01 DIAGNOSIS — I2510 Atherosclerotic heart disease of native coronary artery without angina pectoris: Secondary | ICD-10-CM | POA: Diagnosis not present

## 2023-10-01 DIAGNOSIS — I708 Atherosclerosis of other arteries: Secondary | ICD-10-CM | POA: Diagnosis not present

## 2023-10-04 DIAGNOSIS — I69391 Dysphagia following cerebral infarction: Secondary | ICD-10-CM | POA: Diagnosis not present

## 2023-10-04 DIAGNOSIS — R569 Unspecified convulsions: Secondary | ICD-10-CM | POA: Diagnosis not present

## 2023-10-04 DIAGNOSIS — S22088D Other fracture of T11-T12 vertebra, subsequent encounter for fracture with routine healing: Secondary | ICD-10-CM | POA: Diagnosis not present

## 2023-10-04 DIAGNOSIS — I69398 Other sequelae of cerebral infarction: Secondary | ICD-10-CM | POA: Diagnosis not present

## 2023-10-04 DIAGNOSIS — Z7982 Long term (current) use of aspirin: Secondary | ICD-10-CM | POA: Diagnosis not present

## 2023-10-04 DIAGNOSIS — Z85118 Personal history of other malignant neoplasm of bronchus and lung: Secondary | ICD-10-CM | POA: Diagnosis not present

## 2023-10-04 DIAGNOSIS — I10 Essential (primary) hypertension: Secondary | ICD-10-CM | POA: Diagnosis not present

## 2023-10-04 DIAGNOSIS — E46 Unspecified protein-calorie malnutrition: Secondary | ICD-10-CM | POA: Diagnosis not present

## 2023-10-04 DIAGNOSIS — I2510 Atherosclerotic heart disease of native coronary artery without angina pectoris: Secondary | ICD-10-CM | POA: Diagnosis not present

## 2023-10-04 DIAGNOSIS — M6281 Muscle weakness (generalized): Secondary | ICD-10-CM | POA: Diagnosis not present

## 2023-10-04 DIAGNOSIS — E876 Hypokalemia: Secondary | ICD-10-CM | POA: Diagnosis not present

## 2023-10-04 DIAGNOSIS — Z8616 Personal history of COVID-19: Secondary | ICD-10-CM | POA: Diagnosis not present

## 2023-10-04 DIAGNOSIS — Z905 Acquired absence of kidney: Secondary | ICD-10-CM | POA: Diagnosis not present

## 2023-10-04 DIAGNOSIS — J439 Emphysema, unspecified: Secondary | ICD-10-CM | POA: Diagnosis not present

## 2023-10-04 DIAGNOSIS — R2689 Other abnormalities of gait and mobility: Secondary | ICD-10-CM | POA: Diagnosis not present

## 2023-10-04 DIAGNOSIS — R1310 Dysphagia, unspecified: Secondary | ICD-10-CM | POA: Diagnosis not present

## 2023-10-04 DIAGNOSIS — I708 Atherosclerosis of other arteries: Secondary | ICD-10-CM | POA: Diagnosis not present

## 2023-10-04 DIAGNOSIS — E782 Mixed hyperlipidemia: Secondary | ICD-10-CM | POA: Diagnosis not present

## 2023-10-08 ENCOUNTER — Ambulatory Visit
Admission: RE | Admit: 2023-10-08 | Discharge: 2023-10-08 | Disposition: A | Discharge status: Home / Self Care | Payer: Medicare Other | Source: Ambulatory Visit | Attending: Neurology | Admitting: Neurology

## 2023-10-08 DIAGNOSIS — M8008XA Age-related osteoporosis with current pathological fracture, vertebra(e), initial encounter for fracture: Secondary | ICD-10-CM | POA: Diagnosis not present

## 2023-10-08 DIAGNOSIS — Z9181 History of falling: Secondary | ICD-10-CM

## 2023-10-08 DIAGNOSIS — R269 Unspecified abnormalities of gait and mobility: Secondary | ICD-10-CM

## 2023-10-08 DIAGNOSIS — S22080A Wedge compression fracture of T11-T12 vertebra, initial encounter for closed fracture: Secondary | ICD-10-CM | POA: Diagnosis not present

## 2023-10-08 DIAGNOSIS — S22000A Wedge compression fracture of unspecified thoracic vertebra, initial encounter for closed fracture: Secondary | ICD-10-CM

## 2023-10-08 DIAGNOSIS — S0990XA Unspecified injury of head, initial encounter: Secondary | ICD-10-CM

## 2023-10-08 HISTORY — PX: IR RADIOLOGIST EVAL & MGMT: IMG5224

## 2023-10-08 NOTE — H&P (Signed)
Interventional Radiology - Clinic Visit, Initial H&P    Referring Provider: Dohmeier, Porfirio Mylar, MD  Reason for Visit: T12 compression fracture, back pain     History of Present Illness  Penny [name] is a 89 y.o. female with a relevant past medical history of osteoporosis (last DEXA 07/2021 showing osteopenia, treated with Fosamax between 2017-2022), stroke (currently on aspirin only) seen today in Interventional Radiology clinic for T12 compression fracture.  The patient presents with gradual onset of lower extremity weakness beginning in spring of this year.  This resulted in patient experiencing several falls during the summer, and then with gradual onset of lower back pain over the last few months.  Eventually, the patient had a thoracic spine MRI on August 14, 2023, demonstrating marrow edema at T12 compatible with a acute to subacute compression fracture with 40% height loss.  Patient reports severe 9/10 back pain, particularly exacerbated by any type of movement.  Patient was unable to sit or stand up straight, has had poor balance with attempts at walking, and has difficulty with getting in and out of bed.  She rates significant disability on the [employer] disability questionnaire with 20/24 positive.  She is only taking Tylenol for pain, with only mild improvement.  She has been doing home physical therapy since her fracture diagnosis    Per history significant for stroke, for which she is on aspirin only.  She has a remote history of treated lung cancer.  She has a diagnosis of osteoporosis per chart review, and has been treated with Fosamax between 2017 and 2022.  Her most recent bone density survey in August 2022 demonstrated osteopenia.    Additional Past Medical History Past Medical History:  Diagnosis Date   COPD (chronic obstructive pulmonary disease) with emphysema (HCC)    COVID-19 12/09/2019   Hyperlipidemia    Hypertension    Lung cancer, upper lobe (HCC)  12/04/1996   RUL   Osteoporosis    S/P partial lobectomy of lung    [id number]   Thigh shingles 12/04/1996   Thoracic aorta atherosclerosis (HCC)    Vitamin D deficiency      Surgical History  Past Surgical History:  Procedure Laterality Date   CARPAL TUNNEL RELEASE Right    2009   LUNG REMOVAL, PARTIAL Right    RUL 1998   OLECRANON BURSECTOMY Left 03/20/2017   Procedure: EXICISION OLECRANON BURSA LEFT ELBOW;  Surgeon: Cindee Salt, MD;  Location: [facility];  Service: Orthopedics;  Laterality: Left;  with scb block in preop   SHOULDER ADHESION RELEASE Right    2004     Medications  I have reviewed the current medication list. Refer to chart for details. Current Outpatient Medications  Medication Instructions   acetaminophen (TYLENOL) 650 mg, Oral, As needed   aspirin 81 mg, Oral, Every evening   Budeson-Glycopyrrol-Formoterol (BREZTRI AEROSPHERE) 160-9-4.8 MCG/ACT AERO 1 puff, Inhalation, 2 times daily   buPROPion (WELLBUTRIN) 75 MG tablet TAKE 1 TABLET BY MOUTH DAILY   Cholecalciferol 125 MCG (5000 UT) capsule Take 1 capsules Daily   Cyanocobalamin (VITAMIN B-12) 1000 MCG SUBL Takes 1 tab SL Daily   diphenhydramine-acetaminophen (TYLENOL PM) 25-500 MG TABS tablet 1 tablet, Oral, At bedtime PRN   ezetimibe (ZETIA) 10 mg, Oral, Daily   HYDROcodone-acetaminophen (NORCO/VICODIN) 5-325 MG tablet 0.5 tablets, Every 6 hours PRN   levETIRAcetam (KEPPRA) 500 mg, Oral, 2 times daily   montelukast (SINGULAIR) 10 MG tablet Take  1 tablet  Daily for Allergies                                                            /  TAKE                      BY                     MOUTH   senna-docusate (SENOKOT-S) 8.6-50 MG tablet 1 tablet, Oral, 2 times daily      Allergies Allergies  Allergen Reactions   Ace Inhibitors Cough   Calcium-Containing Compounds Other (See Comments)    constpation   Lactose Intolerance (Gi) Diarrhea   Polysporin  [Bacitracin-Polymyxin B] Rash    Polysporin or neosporin gave her a rash, couldn't remember which   Does patient have contrast allergy: No     Physical Exam Current Vitals Temp: 97.8 F (36.6 C) (Temp Source: Oral)  Pulse Rate: 94  Resp: 16  BP: 129/68  SpO2: 97 % (On RA)     Weight: 54.4 kg  Body mass index is 21.95 kg/m.  General: Alert and answers questions appropriately. Hunched over in wheelchair  HEENT: Normocephalic, atraumatic. Conjunctivae normal without scleral icterus. Cardiac: Regular rate and rhythm. No dependent edema. Pulmonary: Normal work of breathing. On room air. Back: TTP in mid to lower back    Pertinent Lab Results    Latest Ref Rng & Units 08/27/2023   11:51 AM 08/07/2023   10:37 AM 08/06/2023    9:46 AM  CBC  WBC 3.8 - 10.8 Thousand/uL 6.0  6.7  8.7   Hemoglobin 11.7 - 15.5 g/dL 16.1  09.6  04.5   Hematocrit 35.0 - 45.0 % 42.3  36.2  40.4   Platelets 140 - 400 Thousand/uL 236  156  165       Latest Ref Rng & Units 08/27/2023   11:51 AM 08/07/2023   10:37 AM 08/06/2023    9:46 AM  CMP  Glucose 65 - 99 mg/dL 409  811  914   BUN 7 - 25 mg/dL 11  11  15    Creatinine 0.60 - 0.95 mg/dL 7.82  9.56  2.13   Sodium 135 - 146 mmol/L 143  139  138   Potassium 3.5 - 5.3 mmol/L 3.9  3.1  3.3   Chloride 98 - 110 mmol/L 103  103  102   CO2 20 - 32 mmol/L 31  26  25    Calcium 8.6 - 10.4 mg/dL 9.8  8.7  8.9   Total Protein 6.1 - 8.1 g/dL 6.4   6.7   Total Bilirubin 0.2 - 1.2 mg/dL 0.6   0.8   Alkaline Phos 38 - 126 U/L   234   AST 10 - 35 U/L 16   19   ALT 6 - 29 U/L 13   16       Relevant and/or Recent Imaging: MRI T spine 08/14/2023  IMPRESSION:  MRI thoracic spine (without) demonstrating: - Acute to subacute compression fracture of T12, with 40% loss of vertebral body height.  No associated spinal stenosis or foraminal narrowing. - Enlarged abdominal aorta measuring 3.3 cm in diameter. INTERPRETING PHYSICIAN:  Suanne Marker, MD Certified in  Neurology, Neurophysiology and Neuroimaging    Assessment & Plan:   Patient has suffered subacute osteoporotic fracture of the T12 vertebra.   Patient has history of osteoporosis s/p treatment, with nontraumatic subacute compression fracture of T12 with 40% height loss resulting is significant disability. Pain is severe and refractory to conservative measures including home PT.   History and exam have demonstrated the following:  Acute/Subacute fracture by  imaging dated 08/14/2023, Pain on exam concordant with level of fracture, Failure of conservative therapy, and Significant disability on the [employer] Disability Questionnaire with 20/24 positive symptoms, reflecting significant impact/impairment of (ADLs)   ICD-10-CM Codes that Support Medical Necessity (WelshBlog.at.aspx?articleId=57630)  M80.08XA    Age-related osteoporosis with current pathological fracture, vertebra(e), initial encounter for fracture  S22.080A    Wedge compression fracture of T11-T12 vertebra, initial encounter for closed fracture    Plan:  T12 vertebral body augmentation with balloon kyphoplasty  Post-procedure disposition: outpatient [address] [place] office (any physician)  Medication holds: Asprin   The patient has suffered a fracture of the T12 vertebral body. It is recommended that patients aged 44 years or older be evaluated for possible testing or treatment of osteoporosis. A copy of this consult report is sent to the patient's referring physician.  Advanced Care Plan: The patient did not want to provide an Advanced Care Plan at the time of this visit     Total time spent on today's visit was over 40 Minutes, including both face-to-face time and non face-to-face time, personally spent on review of chart (including labs and relevant imaging), discussing further workup and treatment options, referral to specialist if needed, reviewing outside records if  pertinent, answering patient questions, and coordinating care regarding T12 fracture as well as management strategy.      Olive Bass, MD  Vascular and Interventional Radiology 10/08/2023 3:28 PM

## 2023-10-09 ENCOUNTER — Other Ambulatory Visit: Payer: Self-pay | Admitting: Interventional Radiology

## 2023-10-09 DIAGNOSIS — M8008XA Age-related osteoporosis with current pathological fracture, vertebra(e), initial encounter for fracture: Secondary | ICD-10-CM

## 2023-10-09 NOTE — Discharge Instructions (Addendum)
Kyphoplasty Post Procedure Discharge Instructions ° °May resume a regular diet and any medications that you routinely take (including pain medications). However, if you are taking Aspirin or an anticoagulant/blood thinner you will be told when you can resume taking these by the healthcare provider. °No driving day of procedure. °The day of your procedure take it easy. You may use an ice pack as needed to injection sites on back.  Ice to back 30 minutes on and 30 minutes off, as needed. °May remove bandaids tomorrow after taking a shower. Replace daily with a clean bandaid until healed.  °Do not lift anything heavier than a milk jug for 1-2 weeks or determined by your physician.  °Follow up with your physician in 2 weeks. ° ° ° °Please contact our office at [phone] for the following symptoms or if you have any questions: ° °Fever greater than 100 degrees °Increased swelling, pain, or redness at injection site. °Increased back and/or leg pain °New numbness or change in symptoms from before the procedure.  ° ° °Thank you for visiting [facility].  ° °YOU MAY RESUME YOUR ASPIRIN ANYTIME AFTER PROCEDURE TODAY  °

## 2023-10-09 NOTE — Telephone Encounter (Signed)
Thank you, Dr Terrace Arabia.

## 2023-10-10 ENCOUNTER — Ambulatory Visit
Admission: RE | Admit: 2023-10-10 | Discharge: 2023-10-10 | Disposition: A | Discharge status: Home / Self Care | Payer: Medicare Other | Source: Ambulatory Visit | Attending: Interventional Radiology | Admitting: Interventional Radiology

## 2023-10-10 DIAGNOSIS — M4854XA Collapsed vertebra, not elsewhere classified, thoracic region, initial encounter for fracture: Secondary | ICD-10-CM | POA: Diagnosis not present

## 2023-10-10 DIAGNOSIS — M8008XA Age-related osteoporosis with current pathological fracture, vertebra(e), initial encounter for fracture: Secondary | ICD-10-CM | POA: Diagnosis not present

## 2023-10-10 DIAGNOSIS — S22088A Other fracture of T11-T12 vertebra, initial encounter for closed fracture: Secondary | ICD-10-CM | POA: Diagnosis not present

## 2023-10-10 HISTORY — PX: IR KYPHO THORACIC WITH BONE BIOPSY: IMG5518

## 2023-10-10 MED ORDER — CEFAZOLIN SODIUM-DEXTROSE 2-4 GM/100ML-% IV SOLN
2.0000 g | INTRAVENOUS | Status: AC
  Administered 2023-10-10: 2 g via INTRAVENOUS

## 2023-10-10 MED ORDER — FENTANYL CITRATE PF 50 MCG/ML IJ SOSY
25.0000 ug | PREFILLED_SYRINGE | INTRAMUSCULAR | Status: DC | PRN
  Administered 2023-10-10 (×4): 25 ug via INTRAVENOUS

## 2023-10-10 MED ORDER — MIDAZOLAM HCL 2 MG/2ML IJ SOLN
1.0000 mg | INTRAMUSCULAR | Status: DC | PRN
  Administered 2023-10-10: 1 mg via INTRAVENOUS
  Administered 2023-10-10 (×2): 0.5 mg via INTRAVENOUS

## 2023-10-10 MED ORDER — KETOROLAC TROMETHAMINE 30 MG/ML IJ SOLN
30.0000 mg | Freq: Once | INTRAMUSCULAR | Status: AC
  Administered 2023-10-10: 30 mg via INTRAVENOUS

## 2023-10-10 MED ORDER — SODIUM CHLORIDE 0.9 % IV SOLN: INTRAVENOUS | Status: DC

## 2023-10-10 NOTE — Progress Notes (Signed)
Pt back in nursing recovery area. Pt still drowsy from procedure but will wake up when spoken to. Pt follows commands, talks in complete sentences and has no complaints at this time. Pt will be monitored until discharged by Radiologist.   

## 2023-10-16 DIAGNOSIS — E46 Unspecified protein-calorie malnutrition: Secondary | ICD-10-CM | POA: Diagnosis not present

## 2023-10-16 DIAGNOSIS — E782 Mixed hyperlipidemia: Secondary | ICD-10-CM | POA: Diagnosis not present

## 2023-10-16 DIAGNOSIS — I10 Essential (primary) hypertension: Secondary | ICD-10-CM | POA: Diagnosis not present

## 2023-10-16 DIAGNOSIS — I708 Atherosclerosis of other arteries: Secondary | ICD-10-CM | POA: Diagnosis not present

## 2023-10-16 DIAGNOSIS — Z905 Acquired absence of kidney: Secondary | ICD-10-CM | POA: Diagnosis not present

## 2023-10-16 DIAGNOSIS — R2689 Other abnormalities of gait and mobility: Secondary | ICD-10-CM | POA: Diagnosis not present

## 2023-10-16 DIAGNOSIS — I2510 Atherosclerotic heart disease of native coronary artery without angina pectoris: Secondary | ICD-10-CM | POA: Diagnosis not present

## 2023-10-16 DIAGNOSIS — I69398 Other sequelae of cerebral infarction: Secondary | ICD-10-CM | POA: Diagnosis not present

## 2023-10-16 DIAGNOSIS — Z7982 Long term (current) use of aspirin: Secondary | ICD-10-CM | POA: Diagnosis not present

## 2023-10-16 DIAGNOSIS — Z8616 Personal history of COVID-19: Secondary | ICD-10-CM | POA: Diagnosis not present

## 2023-10-16 DIAGNOSIS — J439 Emphysema, unspecified: Secondary | ICD-10-CM | POA: Diagnosis not present

## 2023-10-16 DIAGNOSIS — M6281 Muscle weakness (generalized): Secondary | ICD-10-CM | POA: Diagnosis not present

## 2023-10-16 DIAGNOSIS — R1310 Dysphagia, unspecified: Secondary | ICD-10-CM | POA: Diagnosis not present

## 2023-10-16 DIAGNOSIS — R569 Unspecified convulsions: Secondary | ICD-10-CM | POA: Diagnosis not present

## 2023-10-16 DIAGNOSIS — I69391 Dysphagia following cerebral infarction: Secondary | ICD-10-CM | POA: Diagnosis not present

## 2023-10-16 DIAGNOSIS — S22088D Other fracture of T11-T12 vertebra, subsequent encounter for fracture with routine healing: Secondary | ICD-10-CM | POA: Diagnosis not present

## 2023-10-16 DIAGNOSIS — Z85118 Personal history of other malignant neoplasm of bronchus and lung: Secondary | ICD-10-CM | POA: Diagnosis not present

## 2023-10-16 DIAGNOSIS — E876 Hypokalemia: Secondary | ICD-10-CM | POA: Diagnosis not present

## 2023-10-17 ENCOUNTER — Other Ambulatory Visit: Payer: Self-pay | Admitting: Interventional Radiology

## 2023-10-17 ENCOUNTER — Telehealth: Payer: Self-pay

## 2023-10-17 DIAGNOSIS — I69398 Other sequelae of cerebral infarction: Secondary | ICD-10-CM | POA: Diagnosis not present

## 2023-10-17 DIAGNOSIS — R569 Unspecified convulsions: Secondary | ICD-10-CM | POA: Diagnosis not present

## 2023-10-17 DIAGNOSIS — E876 Hypokalemia: Secondary | ICD-10-CM | POA: Diagnosis not present

## 2023-10-17 DIAGNOSIS — M6281 Muscle weakness (generalized): Secondary | ICD-10-CM | POA: Diagnosis not present

## 2023-10-17 DIAGNOSIS — I708 Atherosclerosis of other arteries: Secondary | ICD-10-CM | POA: Diagnosis not present

## 2023-10-17 DIAGNOSIS — J439 Emphysema, unspecified: Secondary | ICD-10-CM | POA: Diagnosis not present

## 2023-10-17 DIAGNOSIS — S22088D Other fracture of T11-T12 vertebra, subsequent encounter for fracture with routine healing: Secondary | ICD-10-CM | POA: Diagnosis not present

## 2023-10-17 DIAGNOSIS — I69391 Dysphagia following cerebral infarction: Secondary | ICD-10-CM | POA: Diagnosis not present

## 2023-10-17 DIAGNOSIS — I10 Essential (primary) hypertension: Secondary | ICD-10-CM | POA: Diagnosis not present

## 2023-10-17 DIAGNOSIS — R2689 Other abnormalities of gait and mobility: Secondary | ICD-10-CM | POA: Diagnosis not present

## 2023-10-17 DIAGNOSIS — E46 Unspecified protein-calorie malnutrition: Secondary | ICD-10-CM | POA: Diagnosis not present

## 2023-10-17 DIAGNOSIS — Z8616 Personal history of COVID-19: Secondary | ICD-10-CM | POA: Diagnosis not present

## 2023-10-17 DIAGNOSIS — Z85118 Personal history of other malignant neoplasm of bronchus and lung: Secondary | ICD-10-CM | POA: Diagnosis not present

## 2023-10-17 DIAGNOSIS — R1310 Dysphagia, unspecified: Secondary | ICD-10-CM | POA: Diagnosis not present

## 2023-10-17 DIAGNOSIS — Z7982 Long term (current) use of aspirin: Secondary | ICD-10-CM | POA: Diagnosis not present

## 2023-10-17 DIAGNOSIS — Z905 Acquired absence of kidney: Secondary | ICD-10-CM | POA: Diagnosis not present

## 2023-10-17 DIAGNOSIS — E782 Mixed hyperlipidemia: Secondary | ICD-10-CM | POA: Diagnosis not present

## 2023-10-17 DIAGNOSIS — I2510 Atherosclerotic heart disease of native coronary artery without angina pectoris: Secondary | ICD-10-CM | POA: Diagnosis not present

## 2023-10-17 DIAGNOSIS — M8008XA Age-related osteoporosis with current pathological fracture, vertebra(e), initial encounter for fracture: Secondary | ICD-10-CM

## 2023-10-17 NOTE — Telephone Encounter (Signed)
Phone call to pt to follow up from her kyphoplasty on 10/10/23. Pt reports her pain is almost completely gone post procedure but is still having "a little soreness", rating pain 2/10. Getting around better and feels great.    Pt denies any signs of infection, no redness at the site, no draining or fever. Pt has no complaints at this time and will be scheduled for a telephone follow up with Dr. Deanne Coffer next week.   Pt advised to call back if anything were to change or any concerns arise and we will arrange an in person appointment. Pt verbalized understanding.

## 2023-10-24 DIAGNOSIS — I69391 Dysphagia following cerebral infarction: Secondary | ICD-10-CM | POA: Diagnosis not present

## 2023-10-24 DIAGNOSIS — R569 Unspecified convulsions: Secondary | ICD-10-CM | POA: Diagnosis not present

## 2023-10-24 DIAGNOSIS — M6281 Muscle weakness (generalized): Secondary | ICD-10-CM | POA: Diagnosis not present

## 2023-10-24 DIAGNOSIS — E876 Hypokalemia: Secondary | ICD-10-CM | POA: Diagnosis not present

## 2023-10-24 DIAGNOSIS — R1310 Dysphagia, unspecified: Secondary | ICD-10-CM | POA: Diagnosis not present

## 2023-10-24 DIAGNOSIS — Z8616 Personal history of COVID-19: Secondary | ICD-10-CM | POA: Diagnosis not present

## 2023-10-24 DIAGNOSIS — Z85118 Personal history of other malignant neoplasm of bronchus and lung: Secondary | ICD-10-CM | POA: Diagnosis not present

## 2023-10-24 DIAGNOSIS — I69398 Other sequelae of cerebral infarction: Secondary | ICD-10-CM | POA: Diagnosis not present

## 2023-10-24 DIAGNOSIS — E46 Unspecified protein-calorie malnutrition: Secondary | ICD-10-CM | POA: Diagnosis not present

## 2023-10-24 DIAGNOSIS — I2510 Atherosclerotic heart disease of native coronary artery without angina pectoris: Secondary | ICD-10-CM | POA: Diagnosis not present

## 2023-10-24 DIAGNOSIS — R2689 Other abnormalities of gait and mobility: Secondary | ICD-10-CM | POA: Diagnosis not present

## 2023-10-24 DIAGNOSIS — S22088D Other fracture of T11-T12 vertebra, subsequent encounter for fracture with routine healing: Secondary | ICD-10-CM | POA: Diagnosis not present

## 2023-10-24 DIAGNOSIS — Z905 Acquired absence of kidney: Secondary | ICD-10-CM | POA: Diagnosis not present

## 2023-10-24 DIAGNOSIS — J439 Emphysema, unspecified: Secondary | ICD-10-CM | POA: Diagnosis not present

## 2023-10-24 DIAGNOSIS — I10 Essential (primary) hypertension: Secondary | ICD-10-CM | POA: Diagnosis not present

## 2023-10-24 DIAGNOSIS — I708 Atherosclerosis of other arteries: Secondary | ICD-10-CM | POA: Diagnosis not present

## 2023-10-24 DIAGNOSIS — E782 Mixed hyperlipidemia: Secondary | ICD-10-CM | POA: Diagnosis not present

## 2023-10-24 DIAGNOSIS — Z7982 Long term (current) use of aspirin: Secondary | ICD-10-CM | POA: Diagnosis not present

## 2023-10-25 DIAGNOSIS — R202 Paresthesia of skin: Secondary | ICD-10-CM | POA: Diagnosis not present

## 2023-10-25 DIAGNOSIS — G5602 Carpal tunnel syndrome, left upper limb: Secondary | ICD-10-CM | POA: Diagnosis not present

## 2023-10-25 DIAGNOSIS — R200 Anesthesia of skin: Secondary | ICD-10-CM | POA: Diagnosis not present

## 2023-10-26 ENCOUNTER — Ambulatory Visit
Admission: RE | Admit: 2023-10-26 | Discharge: 2023-10-26 | Disposition: A | Discharge status: Home / Self Care | Payer: Medicare Other | Source: Ambulatory Visit | Attending: Interventional Radiology | Admitting: Interventional Radiology

## 2023-10-26 DIAGNOSIS — M8008XA Age-related osteoporosis with current pathological fracture, vertebra(e), initial encounter for fracture: Secondary | ICD-10-CM

## 2023-10-26 NOTE — Progress Notes (Unsigned)
COMPLETE PHYSICAL  Assessment:   Encounter for general adult medical examination with abnormal findings Due yearly  Essential hypertension - controlled without medications - DASH diet, exercise and monitor at home. Call if greater than 130/80.  - CBC with Differential/Platelet - TSH   Hyperlipidemia -continue simvastatin 40 mg and could not tolerate crestor  -check lipids, decrease fatty foods, increase activity.  - Lipid panel  Abnormal glucose Discussed general issues about diabetes pathophysiology and management., Educational material distributed., Suggested low cholesterol diet., Encouraged aerobic exercise., Discussed foot care., Reminded to get yearly retinal exam. - Hemoglobin A1c   Vitamin D deficiency Continue Vit D supplementation to maintain value in therapeutic level of 60-100  - Vit D  25 hydroxy (rtn osteoporosis monitoring)  Hx of cancer of lung CXR 02/07/21, continue follow up- CXR ordered  Osteoporosis after menopause Continue vitamin D Did not tolerate fosamax  Seizure/Intercranial hemorrhage(HCC) Continue medications Continue to follow with neurology  Environmental allergies Allergic rhinitis- Allegra OTC, increase fluids, allergy hygiene explained.  Iron deficiency Check Iron total/TIBC, Ferritin  B12 deficiency - Check B-12   Pulmonary emphysema, unspecified emphysema type (HCC) Continue inhaler as needed Currently asymptomatic  Atherosclerosis of aorta (HCC) BY ct SCAN IN 2015 Continue to control BP, weight , blood sugars and cholesterol  Malignant neoplasm of upper lobe of lung, unspecified laterality (HCC) Continue to monitor with mammograms- last was 11/07/22 negative  Current moderate episode of major depressive disorder, unspecified whether recurrent (HCC) Continue Wellbutrin Monitor symptoms   Medication management -     CBC with Differential/Platelet -     COMPLETE METABOLIC PANEL WITH GFR -     Magnesium -     Lipid panel -      TSH -     Hemoglobin A1C w/out eAG -     VITAMIN D 25 Hydroxy (Vit-D Deficiency, Fractures) -     EKG 12-Lead -     Urinalysis, Routine w reflex microscopic -     Microalbumin / creatinine urine ratio  Iron deficiency anemia, unspecified iron deficiency anemia type -     CBC with Differential/Platelet  Closed fracture of shaft of left humerus with routine healing, unspecified fracture morphology, subsequent encounter Well healed Continue strengthening and mobility exercises  Screening for ischemic heart disease -     EKG 12-Lead  Screening for hematuria or proteinuria -     Urinalysis, Routine w reflex microscopic -     Microalbumin / creatinine urine ratio  Screening for thyroid disorder -     TSH  Flu vaccine need -     Flu vaccine HIGH DOSE PF(Fluzone Trivalent)  Memory changes Continue to work on brain puzzles, reading, crossword, suduko     Further disposition pending results if labs check today. Discussed med's effects and SE's.   Over 30 minutes of face to face interview, exam, counseling, chart review, and critical decision making was performed.    Future Appointments  Date Time Provider Department Center  03/11/2024  2:15 PM Ihor Austin, NP [facility] None  10/28/2024 11:00 AM Raynelle Dick, NP [facility] None     Subjective:   Penny [name] is a 89 y.o. female who presents for complete physical and follow up of has Essential hypertension; Hyperlipidemia, mixed; Personal history of lung cancer; Vitamin D deficiency; COPD (chronic obstructive pulmonary disease) with emphysema (HCC); Atherosclerosis of aorta (HCC) BY ct SCAN IN 2015; Osteoporosis; Abnormal glucose; B12 deficiency; COVID-19 (11/30/2021); Symptomatic sinus bradycardia; Hypokalemia; Hypomagnesemia; CAD (coronary  artery disease); Syncope; Malignant neoplasm of upper lobe of lung, unspecified laterality (HCC); Rigidity (muscles); Traumatic head injury less than 3 months ago; Progressive gait  disorder; Intracranial hemorrhage (HCC); Closed fracture of left proximal humerus; Fall at home, initial encounter; History of COPD; Allergic rhinitis; Depression; Intraparenchymal hematoma of brain (HCC); Closed fracture of shaft of left humerus; ICH (intracerebral hemorrhage) (HCC); Hemorrhagic stroke (HCC); and White matter disease, unspecified on their problem list.   She was [facility] to [facility] for traumatic intracranial hemorrhage in the setting of a mechanical fall. CT of the head showed punctuated foci of acute intraparenchymal hemorrhage within the right frontal cortex without any midline shift; cervical C-spine showed no evidence of acute fracture or subluxation. Imaging also showed acute humeral shaft fracture. Discharged 7/4 to SNF.   She has a porta potty beside bed. She will walk behind the wheel chair. She has no other seizures.  She is having a lot of weakness in her legs, OT/PT comes once a week. She is too weak to use walker by herself. Daughter helps with bathing/dressing. Left arm is feeling better. She has not been using it as much, working on International aid/development worker.   She has not been using her Markus Daft- denies symptoms.   Will have occasional constipation, controlled with colace daily.   Her mood is fairly well controlled with Wellbutrin. Does have more memory difficulties.   BMI is Body mass index is 21.91 kg/m., she has been working on diet and exercise. She eats 3 meals but has Odor appetite.  Wt Readings from Last 3 Encounters:  10/29/23 119 lb 12.8 oz (54.3 kg)  10/08/23 120 lb (54.4 kg)  08/27/23 118 lb 9.6 oz (53.8 kg)   Her blood pressure has been controlled at home currently not on medications, today their BP is BP: 132/74  BP Readings from Last 3 Encounters:  10/29/23 132/74  10/10/23 116/71  10/08/23 129/68  She does not workout but is very active and does try to walk.  She denies chest pain, shortness of breath, dizziness.   She is on  cholesterol medication, simvastatin 40 and denies myalgias. Her cholesterol is at goal. The cholesterol last visit was:   Lab Results  Component Value Date   CHOL 231 (H) 08/07/2023   HDL 47 08/07/2023   LDLCALC 167 (H) 08/07/2023   TRIG 83 08/07/2023   CHOLHDL 4.9 08/07/2023   She has been working on diet and exercise for prediabetes, and denies paresthesia of the feet, polydipsia and polyuria. Last A1C in the office was:  Lab Results  Component Value Date   HGBA1C 5.4 08/07/2023   Patient is on Vitamin D supplement. Discussed decreasing dose, 5,000IU Lab Results  Component Value Date   VD25OH 73 01/03/2023     She continues on Vit B 12 supplement Lab Results  Component Value Date   VITAMINB12 >2,000 (H) 02/07/2022    Medication Review Current Outpatient Medications on File Prior to Visit  Medication Sig Dispense Refill   acetaminophen (TYLENOL) 325 MG tablet Take 650 mg by mouth as needed for mild pain or moderate pain.     aspirin 81 MG chewable tablet Chew 1 tablet (81 mg total) by mouth every evening.     buPROPion (WELLBUTRIN) 75 MG tablet TAKE 1 TABLET BY MOUTH DAILY 90 tablet 0   Cholecalciferol 125 MCG (5000 UT) capsule Take 1 capsules Daily (Patient taking differently: Take 5,000 Units by mouth daily.) 90 capsule 99   Cyanocobalamin (VITAMIN  B-12) 1000 MCG SUBL Takes 1 tab SL Daily (Patient taking differently: Take 1 tablet by mouth daily.)  0   Docusate Sodium (COLACE PO) Take by mouth.     ezetimibe (ZETIA) 10 MG tablet Take 1 tablet (10 mg total) by mouth daily. 90 tablet 2   levETIRAcetam (KEPPRA) 500 MG tablet Take 1 tablet (500 mg total) by mouth 2 (two) times daily. 180 tablet 2   montelukast (SINGULAIR) 10 MG tablet Take  1 tablet  Daily for Allergies                                                            /                            TAKE                      BY                     MOUTH (Patient taking differently: Take 10 mg by mouth every morning.) 90  tablet 3   Budeson-Glycopyrrol-Formoterol (BREZTRI AEROSPHERE) 160-9-4.8 MCG/ACT AERO Inhale 1 puff into the lungs in the morning and at bedtime. (Patient not taking: Reported on 10/29/2023) 10.7 g 3   No current facility-administered medications on file prior to visit.    Current Problems (verified) Patient Active Problem List   Diagnosis Date Noted   White matter disease, unspecified 08/20/2023   ICH (intracerebral hemorrhage) (HCC) 08/06/2023   Hemorrhagic stroke (HCC) 08/06/2023   Intracranial hemorrhage (HCC) 06/01/2023   Closed fracture of left proximal humerus 06/01/2023   Fall at home, initial encounter 06/01/2023   History of COPD 06/01/2023   Allergic rhinitis 06/01/2023   Depression 06/01/2023   Intraparenchymal hematoma of brain (HCC) 06/01/2023   Closed fracture of shaft of left humerus 06/01/2023   Rigidity (muscles) 05/21/2023   Traumatic head injury less than 3 months ago 05/21/2023   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   Malignant neoplasm of upper lobe of lung, unspecified laterality (HCC) 01/07/2023   Syncope    Symptomatic sinus bradycardia 09/10/2022   Hypokalemia 09/10/2022   Hypomagnesemia 09/10/2022   CAD (coronary artery disease) 09/10/2022   COVID-19 (11/30/2021) 12/01/2021   B12 deficiency 06/28/2020   Abnormal glucose 10/09/2018   Osteoporosis 12/28/2015   COPD (chronic obstructive pulmonary disease) with emphysema (HCC) 02/25/2015   Atherosclerosis of aorta (HCC) BY ct SCAN IN 2015 02/25/2015   Essential hypertension    Hyperlipidemia, mixed    Vitamin D deficiency    Personal history of lung cancer 12/04/1996    Immunization History  Administered Date(s) Administered   Influenza, High Dose Seasonal PF 09/15/2015, 10/02/2017, 07/27/2019, 10/07/2020, 09/19/2021, 09/20/2022   Influenza-Unspecified 08/04/2012, 09/03/2013, 09/03/2018   PFIZER(Purple Top)SARS-COV-2 Vaccination 01/26/2020, 02/16/2020   Pneumococcal Conjugate-13 07/21/2014    Pneumococcal Polysaccharide-23 10/30/2005   Td 04/03/2013   Health Maintenance  Topic Date Due   Zoster Vaccines- Shingrix (1 of 2) Never done   COVID-19 Vaccine (3 - Pfizer risk series) 03/15/2020   INFLUENZA VACCINE  07/05/2023   Medicare Annual Wellness (AWV)  05/13/2024   Pneumonia Vaccine 75+ Years old  Completed   DEXA SCAN  Completed   HPV VACCINES  Aged Out   DTaP/Tdap/Td  Discontinued     Names of Other Physician/Practitioners you currently use: 1. [facility]- here for primary care 2. Dr. Randon Goldsmith, eye doctor, last visit 2020 3. , dentist, last visit 2020 4. Dr. Terri Piedra, derm, 2019, goes annually  Patient Care Team: Lucky Cowboy, MD as PCP - General (Internal Medicine) Jodelle Red, MD as PCP - Cardiology (Cardiology) Cherlyn Roberts, MD as Consulting Physician (Dermatology) Cindee Salt, MD as Consulting Physician (Orthopedic Surgery) Sharrell Ku, MD as Consulting Physician (Gastroenterology) [name], MD as Attending Physician (Thoracic Surgery) Micki Riley, MD as Consulting Physician (Neurology)  Allergies Allergies  Allergen Reactions   Ace Inhibitors Cough   Calcium-Containing Compounds Other (See Comments)    constpation   Lactose Intolerance (Gi) Diarrhea   Polysporin [Bacitracin-Polymyxin B] Rash    Polysporin or neosporin gave her a rash, couldn't remember which    SURGICAL HISTORY She  has a past surgical history that includes Carpal tunnel release (Right); Shoulder adhesion release (Right); Lung removal, partial (Right); Olecranon bursectomy (Left, 03/20/2017); IR Radiologist Eval & Mgmt (10/08/2023); and IR KYPHO THORACIC WITH BONE BIOPSY (10/10/2023). FAMILY HISTORY Her family history includes Breast cancer in her paternal aunt; Heart disease in her mother; Lung cancer in her father. SOCIAL HISTORY She  reports that she quit smoking about 26 years ago. Her smoking use included  cigarettes. She has never used smokeless tobacco. She reports current alcohol use of about 1.0 standard drink of alcohol per week. She reports that she does not use drugs.  Review of Systems  Constitutional:  Negative for chills and fever.  HENT:  Negative for congestion, hearing loss, sinus pain, sore throat and tinnitus.   Eyes:  Negative for blurred vision and double vision.  Respiratory:  Negative for cough, hemoptysis, sputum production, shortness of breath and wheezing.   Cardiovascular:  Negative for chest pain, palpitations and leg swelling.  Gastrointestinal:  Negative for abdominal pain, constipation, diarrhea, heartburn, nausea and vomiting.  Genitourinary:  Negative for dysuria and urgency.  Musculoskeletal:  Negative for back pain, falls, joint pain, myalgias and neck pain.       Muscle weakness of extremities  Skin:  Negative for rash.  Neurological:  Negative for dizziness, tingling, tremors, weakness and headaches.  Endo/Heme/Allergies:  Does not bruise/bleed easily.  Psychiatric/Behavioral:  Positive for memory loss. Negative for depression and suicidal ideas. The patient is not nervous/anxious and does not have insomnia.      Objective:   Blood pressure 132/74, pulse 90, temperature 97.9 F (36.6 C), height 5\' 2"  (1.575 m), weight 119 lb 12.8 oz (54.3 kg), SpO2 97%. Body mass index is 21.91 kg/m.  General appearance: alert, no distress, WD/WN,  female HEENT: normocephalic, sclerae anicteric, TMs pearly, nares patent, no discharge or erythema, pharynx normal Oral cavity: MMM, no lesions Neck: supple, no lymphadenopathy, no thyromegaly, no masses Skin: warm, dry, no masses, rashes or lesions noted Heart: RRR, normal S1, S2, no murmurs Lungs: CTA bilaterally, no wheezes, rhonchi, or rales Abdomen: +bs, soft, non tender, non distended, no masses, no hepatomegaly, no splenomegaly Musculoskeletal:  no swelling, no obvious deformity.  Strength diminished 3/5 of  extremities Extremities: no edema, no cyanosis, no clubbing Pulses: 2+ symmetric, upper and lower extremities, normal cap refill Neurological: alert, oriented x 3, CN2-12 intact, sensation normal throughout, DTRs 2+ throughout, no cerebellar signs, gait normal Psychiatric: normal affect, behavior normal, pleasant 2/3 word recall Breast: defer Gyn: defer Rectal: defer  EKG: NSR, no ST changes  [facility]  10/29/2023

## 2023-10-29 ENCOUNTER — Encounter: Payer: Self-pay | Admitting: Nurse Practitioner

## 2023-10-29 ENCOUNTER — Ambulatory Visit (INDEPENDENT_AMBULATORY_CARE_PROVIDER_SITE_OTHER): Payer: Medicare Other | Admitting: Nurse Practitioner

## 2023-10-29 VITALS — BP 132/74 | HR 90 | Temp 97.9°F | Ht 62.0 in | Wt 119.8 lb

## 2023-10-29 DIAGNOSIS — R569 Unspecified convulsions: Secondary | ICD-10-CM

## 2023-10-29 DIAGNOSIS — J439 Emphysema, unspecified: Secondary | ICD-10-CM

## 2023-10-29 DIAGNOSIS — Z23 Encounter for immunization: Secondary | ICD-10-CM

## 2023-10-29 DIAGNOSIS — Z8673 Personal history of transient ischemic attack (TIA), and cerebral infarction without residual deficits: Secondary | ICD-10-CM

## 2023-10-29 DIAGNOSIS — Z0000 Encounter for general adult medical examination without abnormal findings: Secondary | ICD-10-CM | POA: Diagnosis not present

## 2023-10-29 DIAGNOSIS — I629 Nontraumatic intracranial hemorrhage, unspecified: Secondary | ICD-10-CM

## 2023-10-29 DIAGNOSIS — E559 Vitamin D deficiency, unspecified: Secondary | ICD-10-CM

## 2023-10-29 DIAGNOSIS — E782 Mixed hyperlipidemia: Secondary | ICD-10-CM | POA: Diagnosis not present

## 2023-10-29 DIAGNOSIS — I700 Atherosclerosis of aorta: Secondary | ICD-10-CM

## 2023-10-29 DIAGNOSIS — S42302D Unspecified fracture of shaft of humerus, left arm, subsequent encounter for fracture with routine healing: Secondary | ICD-10-CM

## 2023-10-29 DIAGNOSIS — M810 Age-related osteoporosis without current pathological fracture: Secondary | ICD-10-CM

## 2023-10-29 DIAGNOSIS — Z79899 Other long term (current) drug therapy: Secondary | ICD-10-CM

## 2023-10-29 DIAGNOSIS — C3410 Malignant neoplasm of upper lobe, unspecified bronchus or lung: Secondary | ICD-10-CM

## 2023-10-29 DIAGNOSIS — Z136 Encounter for screening for cardiovascular disorders: Secondary | ICD-10-CM | POA: Diagnosis not present

## 2023-10-29 DIAGNOSIS — Z1389 Encounter for screening for other disorder: Secondary | ICD-10-CM

## 2023-10-29 DIAGNOSIS — Z0001 Encounter for general adult medical examination with abnormal findings: Secondary | ICD-10-CM

## 2023-10-29 DIAGNOSIS — R413 Other amnesia: Secondary | ICD-10-CM

## 2023-10-29 DIAGNOSIS — R7309 Other abnormal glucose: Secondary | ICD-10-CM | POA: Diagnosis not present

## 2023-10-29 DIAGNOSIS — I10 Essential (primary) hypertension: Secondary | ICD-10-CM

## 2023-10-29 DIAGNOSIS — D509 Iron deficiency anemia, unspecified: Secondary | ICD-10-CM

## 2023-10-29 DIAGNOSIS — Z1329 Encounter for screening for other suspected endocrine disorder: Secondary | ICD-10-CM

## 2023-10-29 DIAGNOSIS — F321 Major depressive disorder, single episode, moderate: Secondary | ICD-10-CM

## 2023-10-29 NOTE — Patient Instructions (Signed)

## 2023-10-30 LAB — URINALYSIS, ROUTINE W REFLEX MICROSCOPIC
Bacteria, UA: NONE SEEN /[HPF]
Bilirubin Urine: NEGATIVE
Glucose, UA: NEGATIVE
Hgb urine dipstick: NEGATIVE
Hyaline Cast: NONE SEEN /[LPF]
Nitrite: NEGATIVE
Specific Gravity, Urine: 1.024 (ref 1.001–1.035)
pH: 6 (ref 5.0–8.0)

## 2023-10-30 LAB — CBC WITH DIFFERENTIAL/PLATELET
Absolute Lymphocytes: 1107 {cells}/uL (ref 850–3900)
Absolute Monocytes: 614 {cells}/uL (ref 200–950)
Basophils Absolute: 19 {cells}/uL (ref 0–200)
Basophils Relative: 0.3 %
Eosinophils Absolute: 19 {cells}/uL (ref 15–500)
Eosinophils Relative: 0.3 %
HCT: 44.2 % (ref 35.0–45.0)
Hemoglobin: 14.2 g/dL (ref 11.7–15.5)
MCH: 27.8 pg (ref 27.0–33.0)
MCHC: 32.1 g/dL (ref 32.0–36.0)
MCV: 86.7 fL (ref 80.0–100.0)
MPV: 10.8 fL (ref 7.5–12.5)
Monocytes Relative: 9.6 %
Neutro Abs: 4640 {cells}/uL (ref 1500–7800)
Neutrophils Relative %: 72.5 %
Platelets: 256 10*3/uL (ref 140–400)
RBC: 5.1 10*6/uL (ref 3.80–5.10)
RDW: 13.7 % (ref 11.0–15.0)
Total Lymphocyte: 17.3 %
WBC: 6.4 10*3/uL (ref 3.8–10.8)

## 2023-10-30 LAB — LIPID PANEL
Cholesterol: 268 mg/dL — ABNORMAL HIGH (ref <200)
HDL: 60 mg/dL (ref >=50)
LDL Cholesterol (Calc): 182 mg/dL — ABNORMAL HIGH
Non-HDL Cholesterol (Calc): 208 mg/dL — ABNORMAL HIGH (ref <130)
Total CHOL/HDL Ratio: 4.5 (calc) (ref <5.0)
Triglycerides: 124 mg/dL (ref <150)

## 2023-10-30 LAB — COMPLETE METABOLIC PANEL WITH GFR
AG Ratio: 1.6 (calc) (ref 1.0–2.5)
ALT: 27 U/L (ref 6–29)
AST: 29 U/L (ref 10–35)
Albumin: 4.2 g/dL (ref 3.6–5.1)
Alkaline phosphatase (APISO): 159 U/L — ABNORMAL HIGH (ref 37–153)
BUN: 19 mg/dL (ref 7–25)
CO2: 30 mmol/L (ref 20–32)
Calcium: 10.2 mg/dL (ref 8.6–10.4)
Chloride: 103 mmol/L (ref 98–110)
Creat: 0.67 mg/dL (ref 0.60–0.95)
Globulin: 2.7 g/dL (ref 1.9–3.7)
Glucose, Bld: 69 mg/dL (ref 65–99)
Potassium: 3.6 mmol/L (ref 3.5–5.3)
Sodium: 142 mmol/L (ref 135–146)
Total Bilirubin: 0.6 mg/dL (ref 0.2–1.2)
Total Protein: 6.9 g/dL (ref 6.1–8.1)
eGFR: 84 mL/min/{1.73_m2} (ref >=60)

## 2023-10-30 LAB — MAGNESIUM: Magnesium: 1.8 mg/dL (ref 1.5–2.5)

## 2023-10-30 LAB — MICROALBUMIN / CREATININE URINE RATIO
Creatinine, Urine: 195 mg/dL (ref 20–275)
Microalb Creat Ratio: 105 mg/g{creat} — ABNORMAL HIGH (ref <30)
Microalb, Ur: 20.4 mg/dL

## 2023-10-30 LAB — HEMOGLOBIN A1C W/OUT EAG: Hgb A1c MFr Bld: 5.9 %{Hb} — ABNORMAL HIGH (ref <5.7)

## 2023-10-30 LAB — TSH: TSH: 1.04 m[IU]/L (ref 0.40–4.50)

## 2023-10-30 LAB — VITAMIN D 25 HYDROXY (VIT D DEFICIENCY, FRACTURES): Vit D, 25-Hydroxy: 90 ng/mL (ref 30–100)

## 2023-10-31 DIAGNOSIS — Z85118 Personal history of other malignant neoplasm of bronchus and lung: Secondary | ICD-10-CM | POA: Diagnosis not present

## 2023-10-31 DIAGNOSIS — R1310 Dysphagia, unspecified: Secondary | ICD-10-CM | POA: Diagnosis not present

## 2023-10-31 DIAGNOSIS — I708 Atherosclerosis of other arteries: Secondary | ICD-10-CM | POA: Diagnosis not present

## 2023-10-31 DIAGNOSIS — E876 Hypokalemia: Secondary | ICD-10-CM | POA: Diagnosis not present

## 2023-10-31 DIAGNOSIS — E46 Unspecified protein-calorie malnutrition: Secondary | ICD-10-CM | POA: Diagnosis not present

## 2023-10-31 DIAGNOSIS — Z7982 Long term (current) use of aspirin: Secondary | ICD-10-CM | POA: Diagnosis not present

## 2023-10-31 DIAGNOSIS — M6281 Muscle weakness (generalized): Secondary | ICD-10-CM | POA: Diagnosis not present

## 2023-10-31 DIAGNOSIS — S22088D Other fracture of T11-T12 vertebra, subsequent encounter for fracture with routine healing: Secondary | ICD-10-CM | POA: Diagnosis not present

## 2023-10-31 DIAGNOSIS — I69398 Other sequelae of cerebral infarction: Secondary | ICD-10-CM | POA: Diagnosis not present

## 2023-10-31 DIAGNOSIS — I69391 Dysphagia following cerebral infarction: Secondary | ICD-10-CM | POA: Diagnosis not present

## 2023-10-31 DIAGNOSIS — Z905 Acquired absence of kidney: Secondary | ICD-10-CM | POA: Diagnosis not present

## 2023-10-31 DIAGNOSIS — E782 Mixed hyperlipidemia: Secondary | ICD-10-CM | POA: Diagnosis not present

## 2023-10-31 DIAGNOSIS — I2510 Atherosclerotic heart disease of native coronary artery without angina pectoris: Secondary | ICD-10-CM | POA: Diagnosis not present

## 2023-10-31 DIAGNOSIS — J439 Emphysema, unspecified: Secondary | ICD-10-CM | POA: Diagnosis not present

## 2023-10-31 DIAGNOSIS — I10 Essential (primary) hypertension: Secondary | ICD-10-CM | POA: Diagnosis not present

## 2023-10-31 DIAGNOSIS — R2689 Other abnormalities of gait and mobility: Secondary | ICD-10-CM | POA: Diagnosis not present

## 2023-10-31 DIAGNOSIS — Z8616 Personal history of COVID-19: Secondary | ICD-10-CM | POA: Diagnosis not present

## 2023-10-31 DIAGNOSIS — R569 Unspecified convulsions: Secondary | ICD-10-CM | POA: Diagnosis not present

## 2023-11-13 DIAGNOSIS — M25511 Pain in right shoulder: Secondary | ICD-10-CM | POA: Diagnosis not present

## 2023-11-13 DIAGNOSIS — M79601 Pain in right arm: Secondary | ICD-10-CM | POA: Diagnosis not present

## 2023-11-15 DIAGNOSIS — R569 Unspecified convulsions: Secondary | ICD-10-CM | POA: Diagnosis not present

## 2023-11-15 DIAGNOSIS — E46 Unspecified protein-calorie malnutrition: Secondary | ICD-10-CM | POA: Diagnosis not present

## 2023-11-15 DIAGNOSIS — Z85118 Personal history of other malignant neoplasm of bronchus and lung: Secondary | ICD-10-CM | POA: Diagnosis not present

## 2023-11-15 DIAGNOSIS — I2510 Atherosclerotic heart disease of native coronary artery without angina pectoris: Secondary | ICD-10-CM | POA: Diagnosis not present

## 2023-11-15 DIAGNOSIS — R2689 Other abnormalities of gait and mobility: Secondary | ICD-10-CM | POA: Diagnosis not present

## 2023-11-15 DIAGNOSIS — Z7982 Long term (current) use of aspirin: Secondary | ICD-10-CM | POA: Diagnosis not present

## 2023-11-15 DIAGNOSIS — Z905 Acquired absence of kidney: Secondary | ICD-10-CM | POA: Diagnosis not present

## 2023-11-15 DIAGNOSIS — E876 Hypokalemia: Secondary | ICD-10-CM | POA: Diagnosis not present

## 2023-11-15 DIAGNOSIS — Z8616 Personal history of COVID-19: Secondary | ICD-10-CM | POA: Diagnosis not present

## 2023-11-15 DIAGNOSIS — R1310 Dysphagia, unspecified: Secondary | ICD-10-CM | POA: Diagnosis not present

## 2023-11-15 DIAGNOSIS — I69391 Dysphagia following cerebral infarction: Secondary | ICD-10-CM | POA: Diagnosis not present

## 2023-11-15 DIAGNOSIS — I69398 Other sequelae of cerebral infarction: Secondary | ICD-10-CM | POA: Diagnosis not present

## 2023-11-15 DIAGNOSIS — J439 Emphysema, unspecified: Secondary | ICD-10-CM | POA: Diagnosis not present

## 2023-11-15 DIAGNOSIS — I10 Essential (primary) hypertension: Secondary | ICD-10-CM | POA: Diagnosis not present

## 2023-11-15 DIAGNOSIS — S22088D Other fracture of T11-T12 vertebra, subsequent encounter for fracture with routine healing: Secondary | ICD-10-CM | POA: Diagnosis not present

## 2023-11-15 DIAGNOSIS — E782 Mixed hyperlipidemia: Secondary | ICD-10-CM | POA: Diagnosis not present

## 2023-11-15 DIAGNOSIS — I708 Atherosclerosis of other arteries: Secondary | ICD-10-CM | POA: Diagnosis not present

## 2023-11-16 DIAGNOSIS — I69398 Other sequelae of cerebral infarction: Secondary | ICD-10-CM | POA: Diagnosis not present

## 2023-11-16 DIAGNOSIS — E46 Unspecified protein-calorie malnutrition: Secondary | ICD-10-CM | POA: Diagnosis not present

## 2023-11-16 DIAGNOSIS — R569 Unspecified convulsions: Secondary | ICD-10-CM | POA: Diagnosis not present

## 2023-11-16 DIAGNOSIS — E876 Hypokalemia: Secondary | ICD-10-CM | POA: Diagnosis not present

## 2023-11-16 DIAGNOSIS — I10 Essential (primary) hypertension: Secondary | ICD-10-CM | POA: Diagnosis not present

## 2023-11-16 DIAGNOSIS — I708 Atherosclerosis of other arteries: Secondary | ICD-10-CM | POA: Diagnosis not present

## 2023-11-16 DIAGNOSIS — E782 Mixed hyperlipidemia: Secondary | ICD-10-CM | POA: Diagnosis not present

## 2023-11-16 DIAGNOSIS — R1310 Dysphagia, unspecified: Secondary | ICD-10-CM | POA: Diagnosis not present

## 2023-11-16 DIAGNOSIS — I69391 Dysphagia following cerebral infarction: Secondary | ICD-10-CM | POA: Diagnosis not present

## 2023-11-16 DIAGNOSIS — Z8616 Personal history of COVID-19: Secondary | ICD-10-CM | POA: Diagnosis not present

## 2023-11-16 DIAGNOSIS — Z7982 Long term (current) use of aspirin: Secondary | ICD-10-CM | POA: Diagnosis not present

## 2023-11-16 DIAGNOSIS — R2689 Other abnormalities of gait and mobility: Secondary | ICD-10-CM | POA: Diagnosis not present

## 2023-11-16 DIAGNOSIS — S22088D Other fracture of T11-T12 vertebra, subsequent encounter for fracture with routine healing: Secondary | ICD-10-CM | POA: Diagnosis not present

## 2023-11-16 DIAGNOSIS — Z85118 Personal history of other malignant neoplasm of bronchus and lung: Secondary | ICD-10-CM | POA: Diagnosis not present

## 2023-11-16 DIAGNOSIS — Z905 Acquired absence of kidney: Secondary | ICD-10-CM | POA: Diagnosis not present

## 2023-11-16 DIAGNOSIS — J439 Emphysema, unspecified: Secondary | ICD-10-CM | POA: Diagnosis not present

## 2023-11-16 DIAGNOSIS — I2510 Atherosclerotic heart disease of native coronary artery without angina pectoris: Secondary | ICD-10-CM | POA: Diagnosis not present

## 2023-11-19 DIAGNOSIS — M19011 Primary osteoarthritis, right shoulder: Secondary | ICD-10-CM | POA: Diagnosis not present

## 2023-11-21 DIAGNOSIS — E782 Mixed hyperlipidemia: Secondary | ICD-10-CM | POA: Diagnosis not present

## 2023-11-21 DIAGNOSIS — J439 Emphysema, unspecified: Secondary | ICD-10-CM | POA: Diagnosis not present

## 2023-11-21 DIAGNOSIS — E46 Unspecified protein-calorie malnutrition: Secondary | ICD-10-CM | POA: Diagnosis not present

## 2023-11-21 DIAGNOSIS — Z85118 Personal history of other malignant neoplasm of bronchus and lung: Secondary | ICD-10-CM | POA: Diagnosis not present

## 2023-11-21 DIAGNOSIS — R2689 Other abnormalities of gait and mobility: Secondary | ICD-10-CM | POA: Diagnosis not present

## 2023-11-21 DIAGNOSIS — I69398 Other sequelae of cerebral infarction: Secondary | ICD-10-CM | POA: Diagnosis not present

## 2023-11-21 DIAGNOSIS — I708 Atherosclerosis of other arteries: Secondary | ICD-10-CM | POA: Diagnosis not present

## 2023-11-21 DIAGNOSIS — Z8616 Personal history of COVID-19: Secondary | ICD-10-CM | POA: Diagnosis not present

## 2023-11-21 DIAGNOSIS — E876 Hypokalemia: Secondary | ICD-10-CM | POA: Diagnosis not present

## 2023-11-21 DIAGNOSIS — S22088D Other fracture of T11-T12 vertebra, subsequent encounter for fracture with routine healing: Secondary | ICD-10-CM | POA: Diagnosis not present

## 2023-11-21 DIAGNOSIS — I69391 Dysphagia following cerebral infarction: Secondary | ICD-10-CM | POA: Diagnosis not present

## 2023-11-21 DIAGNOSIS — Z905 Acquired absence of kidney: Secondary | ICD-10-CM | POA: Diagnosis not present

## 2023-11-21 DIAGNOSIS — I2510 Atherosclerotic heart disease of native coronary artery without angina pectoris: Secondary | ICD-10-CM | POA: Diagnosis not present

## 2023-11-21 DIAGNOSIS — R1310 Dysphagia, unspecified: Secondary | ICD-10-CM | POA: Diagnosis not present

## 2023-11-21 DIAGNOSIS — R569 Unspecified convulsions: Secondary | ICD-10-CM | POA: Diagnosis not present

## 2023-11-21 DIAGNOSIS — I10 Essential (primary) hypertension: Secondary | ICD-10-CM | POA: Diagnosis not present

## 2023-11-21 DIAGNOSIS — Z7982 Long term (current) use of aspirin: Secondary | ICD-10-CM | POA: Diagnosis not present

## 2023-11-22 ENCOUNTER — Emergency Department (HOSPITAL_COMMUNITY): Payer: Medicare Other

## 2023-11-22 ENCOUNTER — Encounter (HOSPITAL_COMMUNITY): Payer: Self-pay | Admitting: Emergency Medicine

## 2023-11-22 ENCOUNTER — Inpatient Hospital Stay (HOSPITAL_COMMUNITY)
Admission: EM | Admit: 2023-11-22 | Discharge: 2023-11-27 | DRG: 064 | Disposition: A | Discharge status: Skilled Nursing Facility | Payer: Medicare Other | Attending: Internal Medicine | Admitting: Internal Medicine

## 2023-11-22 ENCOUNTER — Other Ambulatory Visit: Payer: Self-pay

## 2023-11-22 DIAGNOSIS — J439 Emphysema, unspecified: Secondary | ICD-10-CM | POA: Diagnosis present

## 2023-11-22 DIAGNOSIS — R29704 NIHSS score 4: Secondary | ICD-10-CM | POA: Diagnosis not present

## 2023-11-22 DIAGNOSIS — U071 COVID-19: Secondary | ICD-10-CM | POA: Diagnosis not present

## 2023-11-22 DIAGNOSIS — Z883 Allergy status to other anti-infective agents status: Secondary | ICD-10-CM

## 2023-11-22 DIAGNOSIS — Z8781 Personal history of (healed) traumatic fracture: Secondary | ICD-10-CM

## 2023-11-22 DIAGNOSIS — I10 Essential (primary) hypertension: Secondary | ICD-10-CM | POA: Diagnosis present

## 2023-11-22 DIAGNOSIS — I700 Atherosclerosis of aorta: Secondary | ICD-10-CM | POA: Diagnosis present

## 2023-11-22 DIAGNOSIS — Z66 Do not resuscitate: Secondary | ICD-10-CM | POA: Diagnosis present

## 2023-11-22 DIAGNOSIS — I619 Nontraumatic intracerebral hemorrhage, unspecified: Secondary | ICD-10-CM | POA: Diagnosis not present

## 2023-11-22 DIAGNOSIS — Z905 Acquired absence of kidney: Secondary | ICD-10-CM | POA: Diagnosis not present

## 2023-11-22 DIAGNOSIS — I680 Cerebral amyloid angiopathy: Secondary | ICD-10-CM | POA: Diagnosis present

## 2023-11-22 DIAGNOSIS — I69120 Aphasia following nontraumatic intracerebral hemorrhage: Secondary | ICD-10-CM | POA: Diagnosis not present

## 2023-11-22 DIAGNOSIS — I69118 Other symptoms and signs involving cognitive functions following nontraumatic intracerebral hemorrhage: Secondary | ICD-10-CM

## 2023-11-22 DIAGNOSIS — E785 Hyperlipidemia, unspecified: Secondary | ICD-10-CM | POA: Diagnosis not present

## 2023-11-22 DIAGNOSIS — G936 Cerebral edema: Secondary | ICD-10-CM | POA: Diagnosis present

## 2023-11-22 DIAGNOSIS — I69351 Hemiplegia and hemiparesis following cerebral infarction affecting right dominant side: Secondary | ICD-10-CM | POA: Diagnosis not present

## 2023-11-22 DIAGNOSIS — I2510 Atherosclerotic heart disease of native coronary artery without angina pectoris: Secondary | ICD-10-CM | POA: Diagnosis present

## 2023-11-22 DIAGNOSIS — H9190 Unspecified hearing loss, unspecified ear: Secondary | ICD-10-CM | POA: Diagnosis not present

## 2023-11-22 DIAGNOSIS — W19XXXA Unspecified fall, initial encounter: Secondary | ICD-10-CM | POA: Diagnosis present

## 2023-11-22 DIAGNOSIS — S8011XA Contusion of right lower leg, initial encounter: Secondary | ICD-10-CM | POA: Diagnosis not present

## 2023-11-22 DIAGNOSIS — Z85118 Personal history of other malignant neoplasm of bronchus and lung: Secondary | ICD-10-CM | POA: Diagnosis not present

## 2023-11-22 DIAGNOSIS — G40909 Epilepsy, unspecified, not intractable, without status epilepticus: Secondary | ICD-10-CM | POA: Diagnosis present

## 2023-11-22 DIAGNOSIS — R569 Unspecified convulsions: Secondary | ICD-10-CM

## 2023-11-22 DIAGNOSIS — E876 Hypokalemia: Secondary | ICD-10-CM | POA: Diagnosis present

## 2023-11-22 DIAGNOSIS — I69311 Memory deficit following cerebral infarction: Secondary | ICD-10-CM | POA: Diagnosis not present

## 2023-11-22 DIAGNOSIS — E782 Mixed hyperlipidemia: Secondary | ICD-10-CM | POA: Diagnosis present

## 2023-11-22 DIAGNOSIS — J449 Chronic obstructive pulmonary disease, unspecified: Secondary | ICD-10-CM | POA: Diagnosis not present

## 2023-11-22 DIAGNOSIS — R4701 Aphasia: Secondary | ICD-10-CM | POA: Diagnosis present

## 2023-11-22 DIAGNOSIS — G8310 Monoplegia of lower limb affecting unspecified side: Secondary | ICD-10-CM | POA: Diagnosis not present

## 2023-11-22 DIAGNOSIS — I629 Nontraumatic intracranial hemorrhage, unspecified: Secondary | ICD-10-CM | POA: Diagnosis not present

## 2023-11-22 DIAGNOSIS — I69111 Memory deficit following nontraumatic intracerebral hemorrhage: Secondary | ICD-10-CM

## 2023-11-22 DIAGNOSIS — R54 Age-related physical debility: Secondary | ICD-10-CM | POA: Diagnosis present

## 2023-11-22 DIAGNOSIS — F0150 Vascular dementia without behavioral disturbance: Secondary | ICD-10-CM | POA: Diagnosis present

## 2023-11-22 DIAGNOSIS — I611 Nontraumatic intracerebral hemorrhage in hemisphere, cortical: Secondary | ICD-10-CM | POA: Diagnosis not present

## 2023-11-22 DIAGNOSIS — R9089 Other abnormal findings on diagnostic imaging of central nervous system: Secondary | ICD-10-CM | POA: Diagnosis not present

## 2023-11-22 DIAGNOSIS — R1310 Dysphagia, unspecified: Secondary | ICD-10-CM | POA: Diagnosis not present

## 2023-11-22 DIAGNOSIS — I69398 Other sequelae of cerebral infarction: Secondary | ICD-10-CM | POA: Diagnosis not present

## 2023-11-22 DIAGNOSIS — M810 Age-related osteoporosis without current pathological fracture: Secondary | ICD-10-CM | POA: Diagnosis present

## 2023-11-22 DIAGNOSIS — Z8616 Personal history of COVID-19: Secondary | ICD-10-CM

## 2023-11-22 DIAGNOSIS — Z87891 Personal history of nicotine dependence: Secondary | ICD-10-CM

## 2023-11-22 DIAGNOSIS — Z741 Need for assistance with personal care: Secondary | ICD-10-CM | POA: Diagnosis not present

## 2023-11-22 DIAGNOSIS — G9349 Other encephalopathy: Secondary | ICD-10-CM | POA: Diagnosis present

## 2023-11-22 DIAGNOSIS — E46 Unspecified protein-calorie malnutrition: Secondary | ICD-10-CM | POA: Diagnosis not present

## 2023-11-22 DIAGNOSIS — R296 Repeated falls: Secondary | ICD-10-CM | POA: Diagnosis present

## 2023-11-22 DIAGNOSIS — Z79899 Other long term (current) drug therapy: Secondary | ICD-10-CM

## 2023-11-22 DIAGNOSIS — E559 Vitamin D deficiency, unspecified: Secondary | ICD-10-CM | POA: Diagnosis not present

## 2023-11-22 DIAGNOSIS — Z7982 Long term (current) use of aspirin: Secondary | ICD-10-CM

## 2023-11-22 DIAGNOSIS — Z803 Family history of malignant neoplasm of breast: Secondary | ICD-10-CM

## 2023-11-22 DIAGNOSIS — E854 Organ-limited amyloidosis: Secondary | ICD-10-CM | POA: Diagnosis present

## 2023-11-22 DIAGNOSIS — S22088D Other fracture of T11-T12 vertebra, subsequent encounter for fracture with routine healing: Secondary | ICD-10-CM | POA: Diagnosis not present

## 2023-11-22 DIAGNOSIS — Z801 Family history of malignant neoplasm of trachea, bronchus and lung: Secondary | ICD-10-CM

## 2023-11-22 DIAGNOSIS — M6281 Muscle weakness (generalized): Secondary | ICD-10-CM | POA: Diagnosis not present

## 2023-11-22 DIAGNOSIS — R2689 Other abnormalities of gait and mobility: Secondary | ICD-10-CM | POA: Diagnosis not present

## 2023-11-22 DIAGNOSIS — Z8619 Personal history of other infectious and parasitic diseases: Secondary | ICD-10-CM

## 2023-11-22 DIAGNOSIS — M1990 Unspecified osteoarthritis, unspecified site: Secondary | ICD-10-CM | POA: Diagnosis present

## 2023-11-22 DIAGNOSIS — I69128 Other speech and language deficits following nontraumatic intracerebral hemorrhage: Secondary | ICD-10-CM | POA: Diagnosis not present

## 2023-11-22 DIAGNOSIS — Z888 Allergy status to other drugs, medicaments and biological substances status: Secondary | ICD-10-CM

## 2023-11-22 DIAGNOSIS — R479 Unspecified speech disturbances: Secondary | ICD-10-CM | POA: Diagnosis not present

## 2023-11-22 DIAGNOSIS — I69391 Dysphagia following cerebral infarction: Secondary | ICD-10-CM | POA: Diagnosis not present

## 2023-11-22 DIAGNOSIS — Z9181 History of falling: Secondary | ICD-10-CM

## 2023-11-22 DIAGNOSIS — Z902 Acquired absence of lung [part of]: Secondary | ICD-10-CM

## 2023-11-22 DIAGNOSIS — I708 Atherosclerosis of other arteries: Secondary | ICD-10-CM | POA: Diagnosis not present

## 2023-11-22 DIAGNOSIS — E739 Lactose intolerance, unspecified: Secondary | ICD-10-CM | POA: Diagnosis present

## 2023-11-22 DIAGNOSIS — Z8249 Family history of ischemic heart disease and other diseases of the circulatory system: Secondary | ICD-10-CM

## 2023-11-22 DIAGNOSIS — R001 Bradycardia, unspecified: Secondary | ICD-10-CM | POA: Diagnosis not present

## 2023-11-22 DIAGNOSIS — Z9889 Other specified postprocedural states: Secondary | ICD-10-CM

## 2023-11-22 DIAGNOSIS — M6259 Muscle wasting and atrophy, not elsewhere classified, multiple sites: Secondary | ICD-10-CM | POA: Diagnosis not present

## 2023-11-22 DIAGNOSIS — R9082 White matter disease, unspecified: Secondary | ICD-10-CM | POA: Diagnosis not present

## 2023-11-22 DIAGNOSIS — R2681 Unsteadiness on feet: Secondary | ICD-10-CM | POA: Diagnosis not present

## 2023-11-22 DIAGNOSIS — R32 Unspecified urinary incontinence: Secondary | ICD-10-CM | POA: Diagnosis present

## 2023-11-22 LAB — COMPREHENSIVE METABOLIC PANEL
ALT: 17 U/L (ref 0–44)
AST: 23 U/L (ref 15–41)
Albumin: 3.8 g/dL (ref 3.5–5.0)
Alkaline Phosphatase: 103 U/L (ref 38–126)
Anion gap: 11 (ref 5–15)
BUN: 11 mg/dL (ref 8–23)
CO2: 26 mmol/L (ref 22–32)
Calcium: 9.5 mg/dL (ref 8.9–10.3)
Chloride: 104 mmol/L (ref 98–111)
Creatinine, Ser: 0.77 mg/dL (ref 0.44–1.00)
GFR, Estimated: >60 mL/min (ref >60)
Glucose, Bld: 118 mg/dL — ABNORMAL HIGH (ref 70–99)
Potassium: 3.5 mmol/L (ref 3.5–5.1)
Sodium: 141 mmol/L (ref 135–145)
Total Bilirubin: 0.7 mg/dL (ref <1.2)
Total Protein: 6.4 g/dL — ABNORMAL LOW (ref 6.5–8.1)

## 2023-11-22 LAB — FOLATE: Folate: 13.9 ng/mL (ref >5.9)

## 2023-11-22 LAB — CBC WITH DIFFERENTIAL/PLATELET
Abs Immature Granulocytes: 0.01 10*3/uL (ref 0.00–0.07)
Basophils Absolute: 0 10*3/uL (ref 0.0–0.1)
Basophils Relative: 0 %
Eosinophils Absolute: 0 10*3/uL (ref 0.0–0.5)
Eosinophils Relative: 0 %
HCT: 40.9 % (ref 36.0–46.0)
Hemoglobin: 13.2 g/dL (ref 12.0–15.0)
Immature Granulocytes: 0 %
Lymphocytes Relative: 22 %
Lymphs Abs: 1.5 10*3/uL (ref 0.7–4.0)
MCH: 27.8 pg (ref 26.0–34.0)
MCHC: 32.3 g/dL (ref 30.0–36.0)
MCV: 86.3 fL (ref 80.0–100.0)
Monocytes Absolute: 0.8 10*3/uL (ref 0.1–1.0)
Monocytes Relative: 11 %
Neutro Abs: 4.4 10*3/uL (ref 1.7–7.7)
Neutrophils Relative %: 67 %
Platelets: 213 10*3/uL (ref 150–400)
RBC: 4.74 MIL/uL (ref 3.87–5.11)
RDW: 14.4 % (ref 11.5–15.5)
WBC: 6.7 10*3/uL (ref 4.0–10.5)
nRBC: 0 % (ref 0.0–0.2)

## 2023-11-22 LAB — I-STAT CHEM 8, ED
BUN: 15 mg/dL (ref 8–23)
Calcium, Ion: 1.15 mmol/L (ref 1.15–1.40)
Chloride: 102 mmol/L (ref 98–111)
Creatinine, Ser: 0.7 mg/dL (ref 0.44–1.00)
Glucose, Bld: 117 mg/dL — ABNORMAL HIGH (ref 70–99)
HCT: 41 % (ref 36.0–46.0)
Hemoglobin: 13.9 g/dL (ref 12.0–15.0)
Potassium: 3.4 mmol/L — ABNORMAL LOW (ref 3.5–5.1)
Sodium: 141 mmol/L (ref 135–145)
TCO2: 29 mmol/L (ref 22–32)

## 2023-11-22 LAB — TSH: TSH: 0.48 u[IU]/mL (ref 0.350–4.500)

## 2023-11-22 LAB — HIV ANTIBODY (ROUTINE TESTING W REFLEX): HIV Screen 4th Generation wRfx: NONREACTIVE

## 2023-11-22 LAB — SEDIMENTATION RATE: Sed Rate: 1 mm/h (ref 0–22)

## 2023-11-22 LAB — AMMONIA: Ammonia: 22 umol/L (ref 9–35)

## 2023-11-22 LAB — VITAMIN B12: Vitamin B-12: 1682 pg/mL — ABNORMAL HIGH (ref 180–914)

## 2023-11-22 MED ORDER — EZETIMIBE 10 MG PO TABS
10.0000 mg | ORAL_TABLET | Freq: Every day | ORAL | Status: DC
  Administered 2023-11-23 – 2023-11-27 (×5): 10 mg via ORAL
  Filled 2023-11-22 (×5): qty 1

## 2023-11-22 MED ORDER — ALBUTEROL SULFATE (2.5 MG/3ML) 0.083% IN NEBU: 3.0000 mL | INHALATION_SOLUTION | Freq: Four times a day (QID) | RESPIRATORY_TRACT | Status: DC | PRN

## 2023-11-22 MED ORDER — MONTELUKAST SODIUM 10 MG PO TABS
10.0000 mg | ORAL_TABLET | Freq: Every morning | ORAL | Status: DC
  Administered 2023-11-23 – 2023-11-27 (×5): 10 mg via ORAL
  Filled 2023-11-22 (×5): qty 1

## 2023-11-22 MED ORDER — ACETAMINOPHEN 160 MG/5ML PO SOLN
650.0000 mg | ORAL | Status: DC | PRN
Start: 2023-11-22 — End: 2023-11-23

## 2023-11-22 MED ORDER — SENNOSIDES-DOCUSATE SODIUM 8.6-50 MG PO TABS: 1.0000 | ORAL_TABLET | Freq: Every evening | ORAL | Status: DC | PRN

## 2023-11-22 MED ORDER — BUPROPION HCL 75 MG PO TABS
75.0000 mg | ORAL_TABLET | Freq: Every day | ORAL | Status: DC
  Administered 2023-11-23 – 2023-11-27 (×5): 75 mg via ORAL
  Filled 2023-11-22 (×5): qty 1

## 2023-11-22 MED ORDER — ACETAMINOPHEN 325 MG PO TABS
650.0000 mg | ORAL_TABLET | ORAL | Status: DC | PRN
  Administered 2023-11-25: 650 mg via ORAL
  Filled 2023-11-22: qty 2

## 2023-11-22 MED ORDER — HYDRALAZINE HCL 20 MG/ML IJ SOLN: 5.0000 mg | INTRAMUSCULAR | Status: DC | PRN

## 2023-11-22 MED ORDER — STROKE: EARLY STAGES OF RECOVERY BOOK
Freq: Once | Status: AC
  Filled 2023-11-22: qty 1

## 2023-11-22 MED ORDER — LEVETIRACETAM 500 MG PO TABS
500.0000 mg | ORAL_TABLET | Freq: Two times a day (BID) | ORAL | Status: DC
Start: 2023-11-22 — End: 2023-11-27
  Administered 2023-11-22 – 2023-11-27 (×10): 500 mg via ORAL
  Filled 2023-11-22 (×10): qty 1

## 2023-11-22 MED ORDER — ACETAMINOPHEN 650 MG RE SUPP: 650.0000 mg | RECTAL | Status: DC | PRN

## 2023-11-22 NOTE — Code Documentation (Signed)
Stroke Response Nurse Documentation Code Documentation  Penny [name] is a 89 y.o. female arriving to [facility]  via [employer] on 11-22-2023 with past medical hx of ICH, CA, COPD. On aspirin 81 mg daily. Code stroke was activated by ED.   Patient from Home where her last known well is unclear and now complaining of increased confusion and incontinence.    Code Stroke activated post head CT results.  NIHSS 0, see documentation for details and code stroke times. Patient with increased confusion on exam. The following imaging was completed:  CT Head. Patient is not a candidate for IV Thrombolytic due to ICH. Patient is not a candidate for IR due to no LVO suspected.   Care Plan: VS and NIHSS q 2 hours x 12.   Bedside handoff with ED RN Mariel.    Marcellina Millin  Stroke Response RN

## 2023-11-22 NOTE — H&P (Signed)
History and Physical    Penny [name] ZOX:[id number] DOB: 13-Sep-1933 DOA: 11/22/2023  PCP: Penny Cowboy, MD  Patient coming from: Home  I have personally briefly reviewed patient's old medical records in [facility]  Chief Complaint: Speech abnormality, lower extremity weakness  HPI: Penny [name] is a 89 y.o. female with medical history significant for COPD, HTN, HLD, OA, remote RUL lung cancer s/p partial lobectomy, recent left temporal ICH of unclear etiology in September 2024 who presented to the ED for evaluation of [name] finding difficulty.  History is supplemented by family at bedside.  Patient had previous admission in June 2024 for traumatic IPH within the right frontal cortex.  She was readmitted in September 2024 with initial presentation of seizures.  She was found to have a Farrel left temporal intraparenchymal hemorrhage of unclear etiology.  She was started on Keppra 500 mg bid.  She was previously on aspirin but taken off during that admission.  Family states over the last 24-48 hours patient has had significant change from baseline.  She has had new speech abnormality with [name] finding difficulty.  She has been feeling weak in her legs and has been having bowel/bladder accidents on herself due to not being able to get to the bathroom in time.  Family states that she does fall often and has had some unwitnessed fall but they are unclear if she has hit her head recently.  She has not been using her walker reliably at home.  Family does state that patient is taking aspirin 81 mg every other day.  ED Course  Labs/Imaging on admission: I have personally reviewed following labs and imaging studies.  Initial vitals showed BP 132/91, pulse 105, RR 20, temp 98.3 F, SpO2 96% on room air.  Labs showed WBC 6.7, hemoglobin 13.2, platelets 213,000, sodium 141, potassium 3.5, bicarb 26, BUN 11, creatinine 0.77, serum glucose 118, LFTs within normal limits, ammonia  22.  UA pending collection.  CT head without contrast showed new left temporal lobe hemorrhage measuring up to 3.5 x 2.3 cm.  There is mass effect on the left lateral ventricle.  No significant midline shift.  No hydrocephalus.  Neurology, Penny [name], was consulted and recommended medical admission, they will follow.  The hospitalist service was consulted to admit for further evaluation and management.  Review of Systems: All systems reviewed and are negative except as documented in history of present illness above.   Past Medical History:  Diagnosis Date   COPD (chronic obstructive pulmonary disease) with emphysema (HCC)    COVID-19 12/09/2019   Hyperlipidemia    Hypertension    Lung cancer, upper lobe (HCC) 12/04/1996   RUL   Osteoporosis    S/P partial lobectomy of lung    [id number]   Thigh shingles 12/04/1996   Thoracic aorta atherosclerosis (HCC)    Vitamin D deficiency     Past Surgical History:  Procedure Laterality Date   CARPAL TUNNEL RELEASE Right    2009   IR KYPHO THORACIC WITH BONE BIOPSY  10/10/2023   IR RADIOLOGIST EVAL & MGMT  10/08/2023   LUNG REMOVAL, PARTIAL Right    RUL 1998   OLECRANON BURSECTOMY Left 03/20/2017   Procedure: EXICISION OLECRANON BURSA LEFT ELBOW;  Surgeon: Penny Salt, MD;  Location: [facility];  Service: Orthopedics;  Laterality: Left;  with scb block in preop   SHOULDER ADHESION RELEASE Right    2004    Social History:  reports that she quit  smoking about 26 years ago. Her smoking use included cigarettes. She has never used smokeless tobacco. She reports current alcohol use of about 1.0 standard drink of alcohol per week. She reports that she does not use drugs.  Allergies  Allergen Reactions   Ace Inhibitors Cough   Calcium-Containing Compounds Other (See Comments)    constipation   Polysporin [Bacitracin-Polymyxin B] Rash    Polysporin or neosporin gave her a rash, couldn't remember which    Family History  Problem  Relation Age of Onset   Heart disease Mother    Lung cancer Father    Breast cancer Paternal Aunt      Prior to Admission medications   Medication Sig Start Date End Date Taking? Authorizing Provider  acetaminophen (TYLENOL) 325 MG tablet Take 650 mg by mouth as needed for mild pain or moderate pain.    [provider]  aspirin 81 MG chewable tablet Chew 1 tablet (81 mg total) by mouth every evening. 08/15/23   Penny China, MD  Budeson-Glycopyrrol-Formoterol (BREZTRI AEROSPHERE) 160-9-4.8 MCG/ACT AERO Inhale 1 puff into the lungs in the morning and at bedtime. Patient not taking: Reported on 10/29/2023 03/21/23   Penny Dick, NP  buPROPion [facility]) 75 MG tablet TAKE 1 TABLET BY MOUTH DAILY 08/13/23   Penny Dick, NP  Cholecalciferol 125 MCG (5000 UT) capsule Take 1 capsules Daily Patient taking differently: Take 5,000 Units by mouth daily. 12/01/21   Penny Gaudier, NP  Cyanocobalamin (VITAMIN B-12) 1000 MCG SUBL Takes 1 tab SL Daily Patient taking differently: Take 1 tablet by mouth daily. 05/02/19   Penny Cowboy, MD  Docusate Sodium (COLACE PO) Take by mouth.    [provider]  ezetimibe (ZETIA) 10 MG tablet Take 1 tablet (10 mg total) by mouth daily. 08/27/23   Penny Dick, NP  levETIRAcetam (KEPPRA) 500 MG tablet Take 1 tablet (500 mg total) by mouth 2 (two) times daily. 09/10/23   Penny Cowboy, MD  montelukast (SINGULAIR) 10 MG tablet Take  1 tablet  Daily for Allergies                                                            /                            TAKE                      BY                     MOUTH Patient taking differently: Take 10 mg by mouth every morning. 05/08/23   Penny Cowboy, MD    Physical Exam: Vitals:   11/22/23 1542 11/22/23 1700 11/22/23 1715  BP: (!) 132/91 (!) 166/132 (!) 132/91  Pulse: (!) 105 (!) 101 100  Resp: 20 15 (!) 24  Temp: 98.3 F (36.8 C)    TempSrc: Oral    SpO2: 96% 97% 96%    Constitutional: Elderly woman resting in bed, NAD, calm, comfortable Eyes: EOMI, lids and conjunctivae normal ENMT: Mucous membranes are moist. Posterior pharynx clear of any exudate or lesions.Normal dentition.  Neck: normal, supple, no masses. Respiratory: clear to auscultation bilaterally, no wheezing, no crackles.  Normal respiratory effort. No accessory muscle use.  Cardiovascular: Regular rate and rhythm, no murmurs / rubs / gallops. No extremity edema. 2+ pedal pulses. Abdomen: no tenderness, no masses palpated.  Musculoskeletal: no clubbing / cyanosis. No joint deformity upper and lower extremities. Good ROM, no contractures. Normal muscle tone.  Skin: no rashes, lesions, ulcers. No induration Neurologic: Exhibits some expressive aphasia otherwise CN 2-12 grossly intact. Sensation intact. Strength 5/5 in all 4.  Psychiatric: Alert and oriented to self, place, but not year or situation. Normal mood.   EKG: Personally reviewed. Sinus rhythm, rate 101, PACs.  Assessment/Plan Principal Problem:   Intracranial hemorrhage (HCC) Active Problems:   Seizure (HCC)   Essential hypertension   Hyperlipidemia, mixed   COPD (chronic obstructive pulmonary disease) with emphysema (HCC)   Penny [name] is a 89 y.o. female with medical history significant for COPD, HTN, HLD, OA, remote RUL lung cancer s/p partial lobectomy, recent left temporal ICH of unclear etiology in September 2024 who presented with expressive aphasia and admitted with a new left temporal ICH.  Assessment and Plan: Left temporal intracranial hemorrhage: New finding per radiology read.  Patient has had new expressive aphasia and reported lower extremity weakness.  She has been on aspirin 81 mg every other day. -Neurology following -No need for repeat MRI or echocardiogram per neurology -Hold aspirin -Keep on telemetry, continue neurochecks, fall precautions -PT/OT/SLP eval -Hydralazine as needed for SBP  >160  Seizures: Initial seizure in September 2024 when she was found to have prior left temporal ICH.  Has been on Keppra prophylaxis. -Continue Keppra 500 mg bid -EEG ordered as per neurology  Hypertension: Not currently on antihypertensives at home.  BP is stable.  IV hydralazine ordered as needed.  COPD: Stable, no wheezing on admission.  Continue Singulair and albuterol as needed.  Hyperlipidemia: Continue Zetia.  T12 compression fracture: S/p IR guided balloon kyphoplasty 10/10/2023.   DVT prophylaxis: SCD's Start: 11/22/23 1937 Code Status:   Code Status: Limited: Do not attempt resuscitation (DNR) -DNR-LIMITED -Do Not Intubate/DNI Discussed with patient's daughter and son on admission. Family Communication: Daughter, son, friend all at bedside Disposition Plan: From home, dispo pending clinical progress Consults called: Neurology Severity of Illness: The appropriate patient status for this patient is INPATIENT. Inpatient status is judged to be reasonable and necessary in order to provide the required intensity of service to ensure the patient's safety. The patient's presenting symptoms, physical exam findings, and initial radiographic and laboratory data in the context of their chronic comorbidities is felt to place them at high risk for further clinical deterioration. Furthermore, it is not anticipated that the patient will be medically stable for discharge from the hospital within 2 midnights of admission.   * I certify that at the point of admission it is my clinical judgment that the patient will require inpatient hospital care spanning beyond 2 midnights from the point of admission due to high intensity of service, high risk for further deterioration and high frequency of surveillance required.Darreld Mclean MD [facility]  If 7PM-7AM, please contact night-coverage [email or web address]  11/22/2023, 7:43 PM

## 2023-11-22 NOTE — Hospital Course (Signed)
Penny [name] is a 89 y.o. female with medical history significant for COPD, HTN, HLD, OA, remote RUL lung cancer s/p partial lobectomy, recent left temporal ICH of unclear etiology in September 2024 who presented with expressive aphasia and admitted with a new left temporal ICH.

## 2023-11-22 NOTE — ED Provider Triage Note (Signed)
Emergency Medicine Provider Triage Evaluation Note  Penny [name] , a 89 y.o. female  was evaluated in triage.  Pt complains of altered mental status.  History given by family at bedside.  Over the past 2 to 3 days patient has been extremely confused.  She has multiple episodes of defecating and urinating on the floor which is abnormal for her.  Patient's female family member states she has some aging memory issues but this is very abnormal.  No history of urinary tract infection.  History of stroke and seizure on Keppra. Compliant with medications. Review of Systems  Positive: Altered mental status Negative: Fever  Physical Exam  BP (!) 132/91   Pulse (!) 105   Temp 98.3 F (36.8 C) (Oral)   Resp 20   SpO2 96%  Gen:   Awake, no distress   Resp:  Normal effort  MSK:   Moves extremities without difficulty  Other:    Medical Decision Making  Medically screening exam initiated at 4:08 PM.  Appropriate orders placed.  Kalirose Hedrich Ijames was informed that the remainder of the evaluation will be completed by another provider, this initial triage assessment does not replace that evaluation, and the importance of remaining in the ED until their evaluation is complete.     Arthor Captain, PA-C 11/22/23 1609

## 2023-11-22 NOTE — ED Notes (Signed)
ED TO INPATIENT HANDOFF REPORT  ED Nurse Name and Phone #:  Candace Cruise [phone]  S Name/Age/Gender Penny [name] 89 y.o. female Room/Bed: 027C/027C  Code Status   Code Status: Prior  Home/SNF/Other Home Patient oriented to: self, place, and time Is this baseline? Yes   Triage Complete: Triage complete  Chief Complaint Intracranial hemorrhage (HCC) [I62.9]  Triage Note Pt presents with family who report increasing altered mental status today.  Frequent falls.  Inability to hold bowel and bladder.  Pt is pleasant and alert at time of triage, pleasantly confused   Allergies Allergies  Allergen Reactions   Ace Inhibitors Cough   Calcium-Containing Compounds Other (See Comments)    constipation   Polysporin [Bacitracin-Polymyxin B] Rash    Polysporin or neosporin gave her a rash, couldn't remember which    Level of Care/Admitting Diagnosis ED Disposition     ED Disposition  Admit   Condition  --   Comment  Hospital Area: [facility] [[id number]]  Level of Care: Progressive [102]  Admit to Progressive based on following criteria: NEUROLOGICAL AND NEUROSURGICAL complex patients with significant risk of instability, who do not meet ICU criteria, yet require close observation or frequent assessment (< / = every 2 - 4 hours) with medical / nursing intervention.  May admit patient to [facility] or [facility] if equivalent level of care is available:: No  Covid Evaluation: Asymptomatic - no recent exposure (last 10 days) testing not required  Diagnosis: Intracranial hemorrhage [facility]) [[id number]]  Admitting Physician: Charlsie Quest [[id number]]  Attending Physician: Charlsie Quest [[id number]]  Certification:: I certify this patient will need inpatient services for at least 2 midnights  Expected Medical Readiness: 11/24/2023          B Medical/Surgery History Past Medical History:  Diagnosis Date   COPD (chronic obstructive pulmonary disease) with  emphysema (HCC)    COVID-19 12/09/2019   Hyperlipidemia    Hypertension    Lung cancer, upper lobe (HCC) 12/04/1996   RUL   Osteoporosis    S/P partial lobectomy of lung    [id number]   Thigh shingles 12/04/1996   Thoracic aorta atherosclerosis (HCC)    Vitamin D deficiency    Past Surgical History:  Procedure Laterality Date   CARPAL TUNNEL RELEASE Right    2009   IR KYPHO THORACIC WITH BONE BIOPSY  10/10/2023   IR RADIOLOGIST EVAL & MGMT  10/08/2023   LUNG REMOVAL, PARTIAL Right    RUL 1998   OLECRANON BURSECTOMY Left 03/20/2017   Procedure: EXICISION OLECRANON BURSA LEFT ELBOW;  Surgeon: Cindee Salt, MD;  Location: [facility];  Service: Orthopedics;  Laterality: Left;  with scb block in preop   SHOULDER ADHESION RELEASE Right    2004     A IV Location/Drains/Wounds Patient Lines/Drains/Airways Status     Active Line/Drains/Airways     Name Placement date Placement time Site Days   Peripheral IV 11/22/23 22 G Posterior;Right Hand 11/22/23  1844  Hand  less than 1            Intake/Output Last 24 hours No intake or output data in the 24 hours ending 11/22/23 1855  Labs/Imaging Results for orders placed or performed during the hospital encounter of 11/22/23 (from the past 48 hours)  Comprehensive metabolic panel     Status: Abnormal   Collection Time: 11/22/23  4:11 PM  Result Value Ref Range   Sodium 141 135 - 145 mmol/L  Potassium 3.5 3.5 - 5.1 mmol/L   Chloride 104 98 - 111 mmol/L   CO2 26 22 - 32 mmol/L   Glucose, Bld 118 (H) 70 - 99 mg/dL    Comment: Glucose reference range applies only to samples taken after fasting for at least 8 hours.   BUN 11 8 - 23 mg/dL   Creatinine, Ser 2.95 0.44 - 1.00 mg/dL   Calcium 9.5 8.9 - 62.1 mg/dL   Total Protein 6.4 (L) 6.5 - 8.1 g/dL   Albumin 3.8 3.5 - 5.0 g/dL   AST 23 15 - 41 U/L   ALT 17 0 - 44 U/L   Alkaline Phosphatase 103 38 - 126 U/L   Total Bilirubin 0.7 <1.2 mg/dL   GFR, Estimated >30 >86 mL/min     Comment: (NOTE) Calculated using the CKD-EPI Creatinine Equation (2021)    Anion gap 11 5 - 15    Comment: Performed at [facility], [address] [place], [place] [address]  CBC with Differential/Platelet     Status: None   Collection Time: 11/22/23  4:11 PM  Result Value Ref Range   WBC 6.7 4.0 - 10.5 K/uL   RBC 4.74 3.87 - 5.11 MIL/uL   Hemoglobin 13.2 12.0 - 15.0 g/dL   HCT 96.2 95.2 - 84.1 %   MCV 86.3 80.0 - 100.0 fL   MCH 27.8 26.0 - 34.0 pg   MCHC 32.3 30.0 - 36.0 g/dL   RDW 32.4 40.1 - 02.7 %   Platelets 213 150 - 400 K/uL   nRBC 0.0 0.0 - 0.2 %   Neutrophils Relative % 67 %   Neutro Abs 4.4 1.7 - 7.7 K/uL   Lymphocytes Relative 22 %   Lymphs Abs 1.5 0.7 - 4.0 K/uL   Monocytes Relative 11 %   Monocytes Absolute 0.8 0.1 - 1.0 K/uL   Eosinophils Relative 0 %   Eosinophils Absolute 0.0 0.0 - 0.5 K/uL   Basophils Relative 0 %   Basophils Absolute 0.0 0.0 - 0.1 K/uL   Immature Granulocytes 0 %   Abs Immature Granulocytes 0.01 0.00 - 0.07 K/uL    Comment: Performed at [facility], [address] [place], [place] [address]  Ammonia     Status: None   Collection Time: 11/22/23  4:28 PM  Result Value Ref Range   Ammonia 22 9 - 35 umol/L    Comment: Performed at [facility], [address], [place], [place] [address]  I-Stat Chem 8, ED     Status: Abnormal   Collection Time: 11/22/23  4:36 PM  Result Value Ref Range   Sodium 141 135 - 145 mmol/L   Potassium 3.4 (L) 3.5 - 5.1 mmol/L   Chloride 102 98 - 111 mmol/L   BUN 15 8 - 23 mg/dL   Creatinine, Ser 7.42 0.44 - 1.00 mg/dL   Glucose, Bld 595 (H) 70 - 99 mg/dL    Comment: Glucose reference range applies only to samples taken after fasting for at least 8 hours.   Calcium, Ion 1.15 1.15 - 1.40 mmol/L   TCO2 29 22 - 32 mmol/L   Hemoglobin 13.9 12.0 - 15.0 g/dL   HCT 63.8 75.6 - 43.3 %   CT HEAD WO CONTRAST Result Date: 11/22/2023 CLINICAL DATA:  Mental status change, unknown cause EXAM: CT  HEAD WITHOUT CONTRAST TECHNIQUE: Contiguous axial images were obtained from the base of the skull through the vertex without intravenous contrast. RADIATION  DOSE REDUCTION: This exam was performed according to the departmental dose-optimization program which includes automated exposure control, adjustment of the mA and/or kV according to patient size and/or use of iterative reconstruction technique. COMPARISON:  Head CT 08/06/23, Brain MR 08/06/23 FINDINGS: Brain: New left temporal lobe hemorrhage now measuring up to 3.2 x 2.3 cm. There is a mass effect on the left lateral ventricle. No significant midline shift. There is minimal asymmetric effacement of the basal cisterns on the left. No hydrocephalus. No intraventricular extension. Redemonstrated extensive periventricular white matter hypodensity. Unchanged size and shape of the ventricular system. Vascular: No hyperdense vessel or unexpected calcification. Skull: Normal. Negative for fracture or focal lesion. Sinuses/Orbits: No middle ear or mastoid effusion. Paranasal sinuses are clear. Bilateral lens replacement. Orbits are otherwise unremarkable. Other: None. IMPRESSION: New left temporal lobe hemorrhage measuring up to 3.2 x 2.3 cm. There is mass effect on the left lateral ventricle. No significant midline shift. No hydrocephalus. Electronically Signed   By: Lorenza Cambridge M.D.   On: 11/22/2023 17:05    Pending Labs Unresulted Labs (From admission, onward)     Start     Ordered   11/22/23 1758  HIV Antibody (routine testing w rflx)  (HIV Antibody (Routine testing w reflex) panel)  Once,   URGENT        11/22/23 1757   11/22/23 1757  RPR  (Dementia Panel (PNL))  Once,   URGENT        11/22/23 1757   11/22/23 1757  Vitamin B12  (Dementia Panel (PNL))  Once,   URGENT        11/22/23 1757   11/22/23 1757  Sedimentation rate  (Dementia Panel (PNL))  Once,   URGENT        11/22/23 1757   11/22/23 1757  TSH  (Dementia Panel (PNL))  Once,   URGENT         11/22/23 1757   11/22/23 1757  Folate  (Dementia Panel (PNL))  Once,   URGENT        11/22/23 1757   11/22/23 1611  Urinalysis, Routine w reflex microscopic -Urine, Clean Catch  Once,   URGENT       Question:  Specimen Source  Answer:  Urine, Clean Catch   11/22/23 1611            Vitals/Pain Today's Vitals   11/22/23 1542 11/22/23 1611 11/22/23 1700 11/22/23 1715  BP: (!) 132/91  (!) 166/132 (!) 132/91  Pulse: (!) 105  (!) 101 100  Resp: 20  15 (!) 24  Temp: 98.3 F (36.8 C)     TempSrc: Oral     SpO2: 96%  97% 96%  PainSc:  0-No pain      Isolation Precautions No active isolations  Medications Medications - No data to display  Mobility walks with person assist     Focused Assessments Neuro Assessment Handoff:     NIH Stroke Scale  Dizziness Present: No Headache Present: No Level of Consciousness (1a.)   : Alert, keenly responsive LOC Questions (1b. )   : Answers both questions correctly LOC Commands (1c. )   : Performs both tasks correctly Best Gaze (2. )  : Normal Visual (3. )  : No visual loss Facial Palsy (4. )    : Normal symmetrical movements Motor Arm, Left (5a. )   : No drift Motor Arm, Right (5b. ) : No drift Motor Leg, Left (6a. )  : No drift Motor Leg,  Right (6b. ) : No drift Limb Ataxia (7. ): Absent Sensory (8. )  : Normal, no sensory loss Best Language (9. )  : No aphasia Dysarthria (10. ): Normal Extinction/Inattention (11.)   : No Abnormality Complete NIHSS TOTAL: 0 Last date known well: 11/21/23 Last time known well:  (unclear) Neuro Assessment: Exceptions to WDL Neuro Checks:      Has TPA been given? No If patient is a Neuro Trauma and patient is going to OR before floor call report to 4N Charge nurse: [phone] or [phone]   R Recommendations: See Admitting Provider Note  Report given to:   Additional Notes:  Patient initially only oriented to self when roomed in ER but is now alert and oriented x3, disoriented to  the situation. Patient is on room air.

## 2023-11-22 NOTE — ED Triage Notes (Signed)
Pt presents with family who report increasing altered mental status today.  Frequent falls.  Inability to hold bowel and bladder.  Pt is pleasant and alert at time of triage, pleasantly confused

## 2023-11-22 NOTE — Consult Note (Addendum)
NEUROLOGY CONSULT NOTE   Date of service: November 22, 2023 Patient Name: Penny [name] MRN:  [id number] DOB:  Mar 16, 1933 Chief Complaint: "confusion" Requesting Provider: Arletha Pili, DO  History of Present Illness  Penny [name] is a 89 y.o. female  has a past medical history of COPD (chronic obstructive pulmonary disease) with emphysema (HCC), COVID-19 (12/09/2019), Hyperlipidemia, Hypertension, Lung cancer, upper lobe (HCC) (12/04/1996), Osteoporosis, S/P partial lobectomy of lung, Thigh shingles (12/04/1996), Thoracic aorta atherosclerosis (HCC), and Vitamin D deficiency. who presents with  confusion, behavioral changes, expressive aphasia, and increased incontinence. She has increased confusion and word finding difficulties today. She has had a couple of fall recently as well. Takes 500mg  keppra BID. Speech has improved since she has been in the hospital today but is having episodes of mumbling that are reported to be abnormal. She was seen in September 2024 for a similar ICH. 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 She does have some microhemorrhages on MRI as well.  Patient was admitted in September 2024 with left temporal intracerebral hemorrhage.  Since then she has had some mild expressive aphasia and for last 1 year she has had some cognitive difficulties and short-term memory loss.  She lives at home with her daughter and requires a lot of help.  Patient had worsening of baseline deficits and was poorly responsive and not speaking well today.  She also had gibberish speech and she was incontinent of urine normally she is able to make it to the bathroom but couple of occasions she was incontinent today.  There is no witnessed tonic-clonic activity noted.  Patient was started on Keppra for seizures during last admission and has been compliant with the medications.  There is no extremity weakness numbness noted.  Patient has poor mobility at  baseline and spends most of the time in a wheelchair.  1a Level of Conscious.: 0 1b LOC Questions: 2 1c LOC Commands: 1 2 Best Gaze: 0 3 Visual: 0 4 Facial Palsy: 0 5a Motor Arm - left: 0 5b Motor Arm - Right: 0 6a Motor Leg - Left: 0 6b Motor Leg - Right: 0 7 Limb Ataxia: 0 8 Sensory: 0 9 Best Language: 1 10 Dysarthria: 0 11 Extinct. and Inatten.: 0 TOTAL: 4 Baseline modified Rankin score  4  ICH score 1  ROS  Comprehensive ROS performed and pertinent positives documented in HPI   Past History   Past Medical History:  Diagnosis Date   COPD (chronic obstructive pulmonary disease) with emphysema (HCC)    COVID-19 12/09/2019   Hyperlipidemia    Hypertension    Lung cancer, upper lobe (HCC) 12/04/1996   RUL   Osteoporosis    S/P partial lobectomy of lung    [id number]   Thigh shingles 12/04/1996   Thoracic aorta atherosclerosis (HCC)    Vitamin D deficiency     Past Surgical History:  Procedure Laterality Date   CARPAL TUNNEL RELEASE Right    2009   IR KYPHO THORACIC WITH BONE BIOPSY  10/10/2023   IR RADIOLOGIST EVAL & MGMT  10/08/2023   LUNG REMOVAL, PARTIAL Right    RUL 1998   OLECRANON BURSECTOMY Left 03/20/2017   Procedure: EXICISION OLECRANON BURSA LEFT ELBOW;  Surgeon: Cindee Salt, MD;  Location: [facility];  Service: Orthopedics;  Laterality: Left;  with scb block in preop   SHOULDER ADHESION RELEASE Right    2004    Family History: Family History  Problem Relation Age  of Onset   Heart disease Mother    Lung cancer Father    Breast cancer Paternal Aunt     Social History  reports that she quit smoking about 26 years ago. Her smoking use included cigarettes. She has never used smokeless tobacco. She reports current alcohol use of about 1.0 standard drink of alcohol per week. She reports that she does not use drugs.  Allergies  Allergen Reactions   Ace Inhibitors Cough   Calcium-Containing Compounds Other (See Comments)    constpation    Lactose Intolerance (Gi) Diarrhea   Polysporin [Bacitracin-Polymyxin B] Rash    Polysporin or neosporin gave her a rash, couldn't remember which    Medications  No current facility-administered medications for this encounter.  Current Outpatient Medications:    acetaminophen (TYLENOL) 325 MG tablet, Take 650 mg by mouth as needed for mild pain or moderate pain., Disp: , Rfl:    aspirin 81 MG chewable tablet, Chew 1 tablet (81 mg total) by mouth every evening., Disp: , Rfl:    Budeson-Glycopyrrol-Formoterol (BREZTRI AEROSPHERE) 160-9-4.8 MCG/ACT AERO, Inhale 1 puff into the lungs in the morning and at bedtime. (Patient not taking: Reported on 10/29/2023), Disp: 10.7 g, Rfl: 3   buPROPion (WELLBUTRIN) 75 MG tablet, TAKE 1 TABLET BY MOUTH DAILY, Disp: 90 tablet, Rfl: 0   Cholecalciferol 125 MCG (5000 UT) capsule, Take 1 capsules Daily (Patient taking differently: Take 5,000 Units by mouth daily.), Disp: 90 capsule, Rfl: 99   Cyanocobalamin (VITAMIN B-12) 1000 MCG SUBL, Takes 1 tab SL Daily (Patient taking differently: Take 1 tablet by mouth daily.), Disp: , Rfl: 0   Docusate Sodium (COLACE PO), Take by mouth., Disp: , Rfl:    ezetimibe (ZETIA) 10 MG tablet, Take 1 tablet (10 mg total) by mouth daily., Disp: 90 tablet, Rfl: 2   levETIRAcetam (KEPPRA) 500 MG tablet, Take 1 tablet (500 mg total) by mouth 2 (two) times daily., Disp: 180 tablet, Rfl: 2   montelukast (SINGULAIR) 10 MG tablet, Take  1 tablet  Daily for Allergies                                                            /                            TAKE                      BY                     MOUTH (Patient taking differently: Take 10 mg by mouth every morning.), Disp: 90 tablet, Rfl: 3  Vitals   Vitals:   11/22/23 1542 11/22/23 1700 11/22/23 1715  BP: (!) 132/91 (!) 166/132 (!) 132/91  Pulse: (!) 105 (!) 101 100  Resp: 20 15 (!) 24  Temp: 98.3 F (36.8 C)    TempSrc: Oral    SpO2: 96% 97% 96%    There is no height or  weight on file to calculate BMI.  Physical Exam   Constitutional: Elderly, frail Caucasian lady Psych: Affect appropriate to situation.  Eyes: No scleral injection.  HENT: No OP obstruction.  Head: Normocephalic.  Cardiovascular: Normal rate and regular rhythm.  Respiratory: Effort normal, non-labored breathing.  GI: Soft.  No distension. There is no tenderness.  Skin: WDI. Bruise on right lower extremity   Neurologic Examination   Neuro: Mental Status: Patient is awake, alert, oriented to self, date, and place. Some confusion about situation.  Follows commands well, able to add change, names 6/14 animals that walk on 4 legs. Unable to remember 3 words throughout exam. Cranial Nerves: II: Visual Fields are full. Pupils are equal, round, and reactive to light.   III,IV, VI: EOMI without ptosis or diploplia.  V: Facial sensation is symmetric to temperature VII: Facial movement is symmetric resting and smiling VIII: Hard of hearing at baseline  X: Palate elevates symmetrically XI: Shoulder shrug is symmetric. XII: Tongue protrudes midline without atrophy or fasciculations.  Motor: Tone is normal. Bulk is normal. 5/5 strength was present in all four extremities.  Sensory: Sensation is symmetric to light touch and temperature in the arms and legs. No extinction to DSS present.  Cerebellar: FNF and HKS are intact bilaterally, ROM slowed    Labs/Imaging/Neurodiagnostic studies   CBC:  Recent Labs  Lab 12/07/23 1611 12-07-2023 1636  WBC 6.7  --   NEUTROABS 4.4  --   HGB 13.2 13.9  HCT 40.9 41.0  MCV 86.3  --   PLT 213  --     Basic Metabolic Panel:  Lab Results  Component Value Date   NA 141 07-Dec-2023   K 3.4 (L) 2023-12-07   CO2 30 10/29/2023   GLUCOSE 117 (H) Dec 07, 2023   BUN 15 12/07/23   CREATININE 0.70 12/07/2023   CALCIUM 10.2 10/29/2023   GFRNONAA >60 08/07/2023   GFRAA 68 02/07/2021    Lipid Panel:  Lab Results  Component Value Date   LDLCALC 182  (H) 10/29/2023    HgbA1c:  Lab Results  Component Value Date   HGBA1C 5.9 (H) 10/29/2023    Urine Drug Screen:     Component Value Date/Time   LABOPIA POSITIVE (A) 08/06/2023 1024   COCAINSCRNUR NONE DETECTED 08/06/2023 1024   LABBENZ NONE DETECTED 08/06/2023 1024   AMPHETMU NONE DETECTED 08/06/2023 1024   THCU NONE DETECTED 08/06/2023 1024   LABBARB NONE DETECTED 08/06/2023 1024     Alcohol Level     Component Value Date/Time   ETH <10 08/06/2023 0946    INR  Lab Results  Component Value Date   INR 1.0 08/06/2023    APTT  Lab Results  Component Value Date   APTT 25 06/01/2023    AED levels: No results found for: "PHENYTOIN", "ZONISAMIDE", "LAMOTRIGINE", "LEVETIRACETA"    CT Head without contrast(Personally reviewed): New left temporal lobe hemorrhage measuring up to 3.2 x 2.3 cm. There is mass effect on the left lateral ventricle. No significant midline shift. No hydrocephalus.  Neurodiagnostics rEEG:  Pending   ASSESSMENT   Adalida Diego is a 89 y.o. female  has a past medical history of COPD (chronic obstructive pulmonary disease) with emphysema (HCC), COVID-19 (12/09/2019), Hyperlipidemia, Hypertension, Lung cancer, upper lobe (HCC) (12/04/1996), Osteoporosis, S/P partial lobectomy of lung, Thigh shingles (12/04/1996), Thoracic aorta atherosclerosis (HCC), and Vitamin D deficiency. She presents with another hemorrhage on CT scan.  She likely has underlying cerebral amyloid angiopathy given multiple brain hemorrhages and underlying dementia and cognitive impairment.  She has had episodes of incontinence and mumbling/expressive aphasia as well. Close neuro monitoring, but she does not need a repeat MRI or echocardiogram. Additionally continue keppra 500mg  BID and we will order an EEG.  Reversible causes of delirium/metabolic encephalopathy workup would be reasonable as well given incontinence episodes as she would be high risk for a UTI.   RECOMMENDATIONS   - EEG  - Continue keppra 500mg  q12hrs  - Continue home BP medications  - Reversible causes of delirium   - UA, B12, Thiamine, TSH - NIH q2x 12hrs  - BP goal 130-150 for 24 hours and then less than 160.    ______________________________________________________________________   Patient seen and examined by NP/APP with MD. MD to update note as needed.   Penny Picker, DNP, FNP-BC [facility] Pager: [phone]  STROKE MD NOTE  I have personally obtained history,examined this patient, reviewed notes, independently viewed imaging studies, participated in medical decision making and plan of care.ROS completed by me personally and pertinent positives fully documented  I have made any additions or clarifications directly to the above note. Agree with note above.  Patient with recent history of left temporal intracerebral hemorrhage and underlying 1 year history of cognitive worsening and dementia possibly from amyloid angiopathy presents with altered mental status and garbled speech and incontinence with CT scan showing a new left temporal lobar hemorrhage with mild cytotoxic edema and mass effect on lateral ventricle but no hydrocephalus or significant midline shift.  Patient's blood pressure is not elevated and she is not on any blood thinners.  Likely etiology for her recurrent intracerebral hemorrhage and cognitive worsening is cerebral amyloid angiopathy.  Neurological exam suggest mild aphasia and cognitive impairment due to underlying vascular dementia.  Recommend admit for neurological observation.  Keep systolic blood pressure below 829.  Continue Keppra 5 mg twice daily for seizures.  Check EEG.  Physical occupational speech therapy consults.  Mobilize out of bed as tolerated.  Check for reversible causes of cognitive impairment with checking UA, chest x-ray comprehensive metabolic panel and CBC.  Long discussion at the bedside with the patient's son and daughter and answered  questions.  She is likely going to need more supervision at home which may not be possible and she may have to go for rehabilitation in the skilled nursing facility.  Long discussion with patient's family and ER MD and answered questions.  Greater than 50% time during this 55-minute consultation visit was spent in counseling and coordination of care about her intracerebral hemorrhage and cognitive impairment and discussion with patient and family and answering questions. Delia Heady, MD Medical Director [facility] Pager: [phone] 11/22/2023 8:14 PM

## 2023-11-22 NOTE — ED Provider Notes (Signed)
[place] EMERGENCY DEPARTMENT AT [facility] Provider Note  CSN: [id number] Arrival date & time: 11/22/23 1505  Chief Complaint(s) Altered Mental Status  HPI Penny [name] is a 89 y.o. female here today for altered mental status, loss of bowel and bladder control.  Patient has a history of prior hemorrhagic stroke in September of this past year, seizure disorder.   Past Medical History Past Medical History:  Diagnosis Date   COPD (chronic obstructive pulmonary disease) with emphysema (HCC)    COVID-19 12/09/2019   Hyperlipidemia    Hypertension    Lung cancer, upper lobe (HCC) 12/04/1996   RUL   Osteoporosis    S/P partial lobectomy of lung    [id number]   Thigh shingles 12/04/1996   Thoracic aorta atherosclerosis (HCC)    Vitamin D deficiency    Patient Active Problem List   Diagnosis Date Noted   White matter disease, unspecified 08/20/2023   ICH (intracerebral hemorrhage) (HCC) 08/06/2023   Hemorrhagic stroke (HCC) 08/06/2023   Intracranial hemorrhage (HCC) 06/01/2023   Closed fracture of left proximal humerus 06/01/2023   Fall at home, initial encounter 06/01/2023   History of COPD 06/01/2023   Allergic rhinitis 06/01/2023   Depression 06/01/2023   Intraparenchymal hematoma of brain (HCC) 06/01/2023   Closed fracture of shaft of left humerus 06/01/2023   Rigidity (muscles) 05/21/2023   Traumatic head injury less than 3 months ago 05/21/2023   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   Malignant neoplasm of upper lobe of lung, unspecified laterality (HCC) 01/07/2023   Syncope    Symptomatic sinus bradycardia 09/10/2022   Hypokalemia 09/10/2022   Hypomagnesemia 09/10/2022   CAD (coronary artery disease) 09/10/2022   COVID-19 (11/30/2021) 12/01/2021   B12 deficiency 06/28/2020   Abnormal glucose 10/09/2018   Osteoporosis 12/28/2015   COPD (chronic obstructive pulmonary disease) with emphysema (HCC) 02/25/2015   Atherosclerosis of aorta (HCC) BY ct  SCAN IN 2015 02/25/2015   Essential hypertension    Hyperlipidemia, mixed    Vitamin D deficiency    Personal history of lung cancer 12/04/1996   Home Medication(s) Prior to Admission medications   Medication Sig Start Date End Date Taking? Authorizing Provider  acetaminophen (TYLENOL) 325 MG tablet Take 650 mg by mouth as needed for mild pain or moderate pain.    [provider]  aspirin 81 MG chewable tablet Chew 1 tablet (81 mg total) by mouth every evening. 08/15/23   Tomie China, MD  Budeson-Glycopyrrol-Formoterol (BREZTRI AEROSPHERE) 160-9-4.8 MCG/ACT AERO Inhale 1 puff into the lungs in the morning and at bedtime. Patient not taking: Reported on 10/29/2023 03/21/23   Raynelle Dick, NP  buPROPion [facility]) 75 MG tablet TAKE 1 TABLET BY MOUTH DAILY 08/13/23   Raynelle Dick, NP  Cholecalciferol 125 MCG (5000 UT) capsule Take 1 capsules Daily Patient taking differently: Take 5,000 Units by mouth daily. 12/01/21   Judd Gaudier, NP  Cyanocobalamin (VITAMIN B-12) 1000 MCG SUBL Takes 1 tab SL Daily Patient taking differently: Take 1 tablet by mouth daily. 05/02/19   Lucky Cowboy, MD  Docusate Sodium (COLACE PO) Take by mouth.    [provider]  ezetimibe (ZETIA) 10 MG tablet Take 1 tablet (10 mg total) by mouth daily. 08/27/23   Raynelle Dick, NP  levETIRAcetam (KEPPRA) 500 MG tablet Take 1 tablet (500 mg total) by mouth 2 (two) times daily. 09/10/23   Lucky Cowboy, MD  montelukast (SINGULAIR) 10 MG tablet Take  1 tablet  Daily for Allergies                                                            /  TAKE                      BY                     MOUTH Patient taking differently: Take 10 mg by mouth every morning. 05/08/23   Lucky Cowboy, MD                                                                                                                                    Past Surgical History Past Surgical History:   Procedure Laterality Date   CARPAL TUNNEL RELEASE Right    2009   IR KYPHO THORACIC WITH BONE BIOPSY  10/10/2023   IR RADIOLOGIST EVAL & MGMT  10/08/2023   LUNG REMOVAL, PARTIAL Right    RUL 1998   OLECRANON BURSECTOMY Left 03/20/2017   Procedure: EXICISION OLECRANON BURSA LEFT ELBOW;  Surgeon: Cindee Salt, MD;  Location: [facility];  Service: Orthopedics;  Laterality: Left;  with scb block in preop   SHOULDER ADHESION RELEASE Right    2004   Family History Family History  Problem Relation Age of Onset   Heart disease Mother    Lung cancer Father    Breast cancer Paternal Aunt     Social History Social History   Tobacco Use   Smoking status: Former    Current packs/day: 0.00    Types: Cigarettes    Quit date: 12/04/1996    Years since quitting: 26.9   Smokeless tobacco: Never  Vaping Use   Vaping status: Never Used  Substance Use Topics   Alcohol use: Yes    Alcohol/week: 1.0 standard drink of alcohol    Types: 1 Glasses of wine per week    Comment: social   Drug use: No   Allergies Ace inhibitors, Calcium-containing compounds, Lactose intolerance (gi), and Polysporin [bacitracin-polymyxin b]  Review of Systems Review of Systems  Physical Exam Vital Signs  I have reviewed the triage vital signs BP (!) 132/91   Pulse (!) 105   Temp 98.3 F (36.8 C) (Oral)   Resp 20   SpO2 96%   Physical Exam HENT:     Head: Normocephalic.     Mouth/Throat:     Mouth: Mucous membranes are moist.  Cardiovascular:     Rate and Rhythm: Normal rate.  Abdominal:     General: Abdomen is flat.     Palpations: Abdomen is soft.  Neurological:     General: No focal deficit present.     Mental Status: She is alert.     Cranial Nerves: No cranial nerve deficit.     Motor: No weakness.     ED Results and Treatments Labs (all labs ordered are listed, but only abnormal results are displayed) Labs Reviewed  I-STAT CHEM 8, ED - Abnormal; Notable for the following  components:  Result Value   Potassium 3.4 (*)    Glucose, Bld 117 (*)    All other components within normal limits  COMPREHENSIVE METABOLIC PANEL  CBC WITH DIFFERENTIAL/PLATELET  URINALYSIS, ROUTINE W REFLEX MICROSCOPIC  AMMONIA  CBG MONITORING, ED                                                                                                                          Radiology CT HEAD WO CONTRAST Result Date: 11/22/2023 CLINICAL DATA:  Mental status change, unknown cause EXAM: CT HEAD WITHOUT CONTRAST TECHNIQUE: Contiguous axial images were obtained from the base of the skull through the vertex without intravenous contrast. RADIATION DOSE REDUCTION: This exam was performed according to the departmental dose-optimization program which includes automated exposure control, adjustment of the mA and/or kV according to patient size and/or use of iterative reconstruction technique. COMPARISON:  Head CT 08/06/23, Brain MR 08/06/23 FINDINGS: Brain: New left temporal lobe hemorrhage now measuring up to 3.2 x 2.3 cm. There is a mass effect on the left lateral ventricle. No significant midline shift. There is minimal asymmetric effacement of the basal cisterns on the left. No hydrocephalus. No intraventricular extension. Redemonstrated extensive periventricular white matter hypodensity. Unchanged size and shape of the ventricular system. Vascular: No hyperdense vessel or unexpected calcification. Skull: Normal. Negative for fracture or focal lesion. Sinuses/Orbits: No middle ear or mastoid effusion. Paranasal sinuses are clear. Bilateral lens replacement. Orbits are otherwise unremarkable. Other: None. IMPRESSION: New left temporal lobe hemorrhage measuring up to 3.2 x 2.3 cm. There is mass effect on the left lateral ventricle. No significant midline shift. No hydrocephalus. Electronically Signed   By: Lorenza Cambridge M.D.   On: 11/22/2023 17:05    Pertinent labs & imaging results that were available during my  care of the patient were reviewed by me and considered in my medical decision making (see MDM for details).  Medications Ordered in ED Medications - No data to display                                                                                                                                   Procedures .Critical Care  Performed by: Arletha Pili, DO Authorized by: Arletha Pili, DO   Critical care provider statement:    Critical care time (minutes):  30   Critical care was necessary to treat or prevent imminent or life-threatening  deterioration of the following conditions:  CNS failure or compromise   Critical care was time spent personally by me on the following activities:  Development of treatment plan with patient or surrogate, discussions with consultants, evaluation of patient's response to treatment, examination of patient, ordering and review of laboratory studies, ordering and review of radiographic studies, ordering and performing treatments and interventions, pulse oximetry, re-evaluation of patient's condition and review of old charts   (including critical care time)  Medical Decision Making / ED Course   This patient presents to the ED for concern of altered mental status, this involves an extensive number of treatment options, and is a complaint that carries with it a high risk of complications and morbidity.  The differential diagnosis includes CVA, ICH, electrolyte abnormalities, underlying infection.  MDM: Patient evaluated in triage with routine labs ordered.  Airway clear to the patient's head CT, and saw an intracranial hemorrhage.  At nearly the same time today open the patient's image, was called by radiology.  This appears to be a new intracranial hemorrhage in the same place for the patient had 1 previously.  Remarkably, the patient has no neurological deficits on exam.  Activated the patient as a code stroke.  Reassessment 6:10 PM-discussed patient's  care with Dr. Pearlean Brownie of neurology.  He is familiar with the patient having seen her in September of this past year.  Believe she is appropriate for floor.  Patient not requiring any additional blood pressure control at this time, has no neurological deficits on my exam.  Likely require skilled nursing facility placement upon discharge.  Additional history obtained: -Additional history obtained from family -External records from outside source obtained and reviewed including: Chart review including previous notes, labs, imaging, consultation notes   Lab Tests: -I ordered, reviewed, and interpreted labs.   The pertinent results include:   Labs Reviewed  I-STAT CHEM 8, ED - Abnormal; Notable for the following components:      Result Value   Potassium 3.4 (*)    Glucose, Bld 117 (*)    All other components within normal limits  COMPREHENSIVE METABOLIC PANEL  CBC WITH DIFFERENTIAL/PLATELET  URINALYSIS, ROUTINE W REFLEX MICROSCOPIC  AMMONIA  CBG MONITORING, ED      EKG sinus tachycardia  EKG Interpretation Date/Time:    Ventricular Rate:    PR Interval:    QRS Duration:    QT Interval:    QTC Calculation:   R Axis:      Text Interpretation:           Imaging Studies ordered: I ordered imaging studies including CT imaging of the head I independently visualized and interpreted imaging. I agree with the radiologist interpretation   Medicines ordered and prescription drug management: No orders of the defined types were placed in this encounter.   -I have reviewed the patients home medicines and have made adjustments as needed  Critical interventions Management of intracranial hemorrhage  Consultations Obtained: I requested consultation with the neurology team,  and discussed lab and imaging findings as well as pertinent plan - they recommend: Floor admission   Cardiac Monitoring: The patient was maintained on a cardiac monitor.  I personally viewed and interpreted the  cardiac monitored which showed an underlying rhythm of: Sinus rhythm  Social Determinants of Health:  Factors impacting patients care include:    Reevaluation: After the interventions noted above, I reevaluated the patient and found that they have :improved  Co morbidities that complicate the patient evaluation  Past Medical History:  Diagnosis Date   COPD (chronic obstructive pulmonary disease) with emphysema (HCC)    COVID-19 12/09/2019   Hyperlipidemia    Hypertension    Lung cancer, upper lobe (HCC) 12/04/1996   RUL   Osteoporosis    S/P partial lobectomy of lung    [id number]   Thigh shingles 12/04/1996   Thoracic aorta atherosclerosis (HCC)    Vitamin D deficiency       Dispostion: Admission     Final Clinical Impression(s) / ED Diagnoses Final diagnoses:  None     @PCDICTATION @    Anders Simmonds T, DO 11/22/23 1811

## 2023-11-23 ENCOUNTER — Inpatient Hospital Stay (HOSPITAL_COMMUNITY): Payer: Medicare Other

## 2023-11-23 DIAGNOSIS — R569 Unspecified convulsions: Secondary | ICD-10-CM | POA: Diagnosis not present

## 2023-11-23 DIAGNOSIS — I629 Nontraumatic intracranial hemorrhage, unspecified: Secondary | ICD-10-CM | POA: Diagnosis not present

## 2023-11-23 LAB — BASIC METABOLIC PANEL
Anion gap: 13 (ref 5–15)
BUN: 9 mg/dL (ref 8–23)
CO2: 24 mmol/L (ref 22–32)
Calcium: 9.2 mg/dL (ref 8.9–10.3)
Chloride: 105 mmol/L (ref 98–111)
Creatinine, Ser: 0.65 mg/dL (ref 0.44–1.00)
GFR, Estimated: >60 mL/min (ref >60)
Glucose, Bld: 116 mg/dL — ABNORMAL HIGH (ref 70–99)
Potassium: 3.1 mmol/L — ABNORMAL LOW (ref 3.5–5.1)
Sodium: 142 mmol/L (ref 135–145)

## 2023-11-23 LAB — CBC
HCT: 38.3 % (ref 36.0–46.0)
Hemoglobin: 12.5 g/dL (ref 12.0–15.0)
MCH: 28.1 pg (ref 26.0–34.0)
MCHC: 32.6 g/dL (ref 30.0–36.0)
MCV: 86.1 fL (ref 80.0–100.0)
Platelets: 180 10*3/uL (ref 150–400)
RBC: 4.45 MIL/uL (ref 3.87–5.11)
RDW: 14.3 % (ref 11.5–15.5)
WBC: 6 10*3/uL (ref 4.0–10.5)
nRBC: 0 % (ref 0.0–0.2)

## 2023-11-23 LAB — RPR: RPR Ser Ql: NONREACTIVE

## 2023-11-23 MED ORDER — POTASSIUM CHLORIDE CRYS ER 20 MEQ PO TBCR
40.0000 meq | EXTENDED_RELEASE_TABLET | Freq: Once | ORAL | Status: AC
  Administered 2023-11-23: 40 meq via ORAL
  Filled 2023-11-23: qty 2

## 2023-11-23 NOTE — Progress Notes (Signed)
EEG complete - results pending 

## 2023-11-23 NOTE — Progress Notes (Signed)
   Penny [name]  VFI:[id number] DOB: 31-Oct-1933 DOA: 11/22/2023 PCP: Lucky Cowboy, MD    Brief Narrative:  89 year old with a history of COPD, HTN, HLD, OA, RUL lung cancer status post lobectomy, right frontal cortex traumatic IPH June 2024, and a left temporal ICH of unclear etiology Sept 2024 who presented to the ER 12/19 with the acute onset of word finding difficulty over a 24-48-hour timeframe.  Goals of Care:   Code Status: Limited: Do not attempt resuscitation (DNR) -DNR-LIMITED -Do Not Intubate/DNI    DVT prophylaxis: SCD's Start: 11/22/23 1937   Interim Hx: Afebrile.  Vital signs stable.  Assessment & Plan:  Left temporal intracranial hemorrhage Care per Neurology -clinical deficits include expressive aphasia increased incontinence and generalized confusion -presentation similar to that in September 2024 -suspected to be related to amyloid angiopathy  Vascular dementia Related to multiple CNS insults dating back to June 2024  Seizure disorder Continue usual seizure medication initiated during September 2024 admission  HTN Blood pressure controlled at present  Modest hypokalemia Likely due to poor intake -supplement and follow  COPD Stable at present  HLD Continue usual home medications  Recent T12 compression fracture Status post IR guided balloon kyphoplasty 10/10/2023  History of lung cancer status post partial lobectomy  Family Communication: No family present at time of exam Disposition: Will require SNF   Objective: Blood pressure 131/83, pulse 88, temperature 98.1 F (36.7 C), temperature source Oral, resp. rate 18, height 5\' 2"  (1.575 m), weight 53.6 kg, SpO2 91%.  Intake/Output Summary (Last 24 hours) at 11/23/2023 0911 Last data filed at 11/22/2023 2030 Gross per 24 hour  Intake 30 ml  Output --  Net 30 ml   Filed Weights   11/23/23 0600  Weight: 53.6 kg    Examination: General: No acute respiratory distress Lungs: Clear to  auscultation bilaterally without wheezes or crackles Cardiovascular: Regular rate and rhythm without murmur gallop or rub normal S1 and S2 Abdomen: Nontender, nondistended, soft, bowel sounds positive, no rebound, no ascites, no appreciable mass Extremities: No significant cyanosis, clubbing, or edema bilateral lower extremities  CBC: Recent Labs  Lab 11/22/23 1611 11/22/23 1636 11/23/23 0357  WBC 6.7  --  6.0  NEUTROABS 4.4  --   --   HGB 13.2 13.9 12.5  HCT 40.9 41.0 38.3  MCV 86.3  --  86.1  PLT 213  --  180   Basic Metabolic Panel: Recent Labs  Lab 11/22/23 1611 11/22/23 1636 11/23/23 0357  NA 141 141 142  K 3.5 3.4* 3.1*  CL 104 102 105  CO2 26  --  24  GLUCOSE 118* 117* 116*  BUN 11 15 9   CREATININE 0.77 0.70 0.65  CALCIUM 9.5  --  9.2   GFR: Estimated Creatinine Clearance: 38.4 mL/min (by C-G formula based on SCr of 0.65 mg/dL).   Scheduled Meds:   stroke: early stages of recovery book   Does not apply Once   buPROPion  75 mg Oral Daily   ezetimibe  10 mg Oral Daily   levETIRAcetam  500 mg Oral BID   montelukast  10 mg Oral q morning      LOS: 1 day   Lonia Blood, MD [facility] Office  [phone] Pager - Text Page per Loretha Stapler  If 7PM-7AM, please contact night-coverage per Amion 11/23/2023, 9:11 AM

## 2023-11-23 NOTE — Plan of Care (Signed)

## 2023-11-23 NOTE — Plan of Care (Signed)
Sitting up in the chair watching TV.  Did get confused once and tried to get up "to go home".  Was reminded that she cannot go home at this time but she needs to stay here until MD assesses that she is ready to discharge.  She said ok and sat back down in the chair.   Problem: Nutrition: Goal: Risk of aspiration will decrease Outcome: Progressing   Problem: Nutrition: Goal: Dietary intake will improve Outcome: Progressing   Problem: Activity: Goal: Risk for activity intolerance will decrease Outcome: Progressing   Problem: Nutrition: Goal: Adequate nutrition will be maintained Outcome: Progressing   Problem: Coping: Goal: Level of anxiety will decrease Outcome: Progressing

## 2023-11-23 NOTE — Progress Notes (Addendum)
STROKE TEAM PROGRESS NOTE   BRIEF HPI [name] is a 89 y.o. female with history of COPD, hypertension, hyperlipidemia, lung cancer, osteoporosis, seizures, shingles and vitamin D deficiency presenting with confusion, behavioral changes, urinary incontinence and expressive aphasia.  She did have a few recent falls.  She was found to have a left temporal lobe ICH, likely subacute on head CT.  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  NIH on Admission 4  INTERIM HISTORY/SUBJECTIVE Patient remains pleasantly demented with some word finding difficulties and poor insight into her condition.  She at times can speak fluently but at times she gets stuck with certain words. Patient has remained hemodynamically stable overnight, and her EEG demonstrated no seizures or epileptiform discharges.  OBJECTIVE  CBC    Component Value Date/Time   WBC 6.0 11/23/2023 0357   RBC 4.45 11/23/2023 0357   HGB 12.5 11/23/2023 0357   HCT 38.3 11/23/2023 0357   PLT 180 11/23/2023 0357   MCV 86.1 11/23/2023 0357   MCH 28.1 11/23/2023 0357   MCHC 32.6 11/23/2023 0357   RDW 14.3 11/23/2023 0357   LYMPHSABS 1.5 11/22/2023 1611   MONOABS 0.8 11/22/2023 1611   EOSABS 0.0 11/22/2023 1611   BASOSABS 0.0 11/22/2023 1611    BMET    Component Value Date/Time   NA 142 11/23/2023 0357   K 3.1 (L) 11/23/2023 0357   CL 105 11/23/2023 0357   CO2 24 11/23/2023 0357   GLUCOSE 116 (H) 11/23/2023 0357   BUN 9 11/23/2023 0357   CREATININE 0.65 11/23/2023 0357   CREATININE 0.67 10/29/2023 1113   CALCIUM 9.2 11/23/2023 0357   EGFR 84 10/29/2023 1113   GFRNONAA >60 11/23/2023 0357   GFRNONAA 59 (L) 02/07/2021 1450    IMAGING past 24 hours EEG adult Result Date: 11/23/2023 Charlsie Quest, MD     11/23/2023 11:34 AM Patient Name: Penny [name] MRN: [id number] Epilepsy Attending: Charlsie Quest Referring Physician/Provider: Elmer Picker, NP Date: 11/23/2023 Duration: 22.59 mins Patient history: 89 year old  female presented with history of temporal hemorrhage presented with an episode of incontinence and speech disturbance.  EEG to evaluate for seizure. Level of alertness: Awake AEDs during EEG study: LEV Technical aspects: This EEG study was done with scalp electrodes positioned according to the 10-20 International system of electrode placement. Electrical activity was reviewed with band pass filter of 1-70Hz , sensitivity of 7 uV/mm, display speed of 12mm/sec with a 60Hz  notched filter applied as appropriate. EEG data were recorded continuously and digitally stored.  Video monitoring was available and reviewed as appropriate. Description: The posterior dominant rhythm consists of 8 Hz activity of moderate voltage (25-35 uV) seen predominantly in posterior head regions, symmetric and reactive to eye opening and eye closing. EEG showed continuous 3 to 6 Hz theta-delta slowing in left fronto-temporal region. Intermittent generalized 3-5Hz  theta- delta slowing was also noted. Hyperventilation and photic stimulation were not performed.   ABNORMALITY - Continuous slow, left frontotemporal region - Intermittent slow, generalized IMPRESSION: This study is suggestive of cortical dysfunction arising from left frontotemporal region likely secondary to underlying hemorrhage.  Additionally there is mild diffuse encephalopathy.  No seizures or definite epileptiform discharges were seen throughout the recording. Charlsie Quest   CT HEAD WO CONTRAST Result Date: 11/22/2023 CLINICAL DATA:  Mental status change, unknown cause EXAM: CT HEAD WITHOUT CONTRAST TECHNIQUE: Contiguous axial images were obtained from the base of the skull through the vertex without intravenous contrast. RADIATION DOSE REDUCTION: This exam  was performed according to the departmental dose-optimization program which includes automated exposure control, adjustment of the mA and/or kV according to patient size and/or use of iterative reconstruction technique.  COMPARISON:  Head CT 08/06/23, Brain MR 08/06/23 FINDINGS: Brain: New left temporal lobe hemorrhage now measuring up to 3.2 x 2.3 cm. There is a mass effect on the left lateral ventricle. No significant midline shift. There is minimal asymmetric effacement of the basal cisterns on the left. No hydrocephalus. No intraventricular extension. Redemonstrated extensive periventricular white matter hypodensity. Unchanged size and shape of the ventricular system. Vascular: No hyperdense vessel or unexpected calcification. Skull: Normal. Negative for fracture or focal lesion. Sinuses/Orbits: No middle ear or mastoid effusion. Paranasal sinuses are clear. Bilateral lens replacement. Orbits are otherwise unremarkable. Other: None. IMPRESSION: New left temporal lobe hemorrhage measuring up to 3.2 x 2.3 cm. There is mass effect on the left lateral ventricle. No significant midline shift. No hydrocephalus. Electronically Signed   By: Lorenza Cambridge M.D.   On: 11/22/2023 17:05    Vitals:   11/23/23 0400 11/23/23 0600 11/23/23 0757 11/23/23 1125  BP: 139/87  131/83 (!) 160/96  Pulse: 88  88 94  Resp: 18  18 17   Temp: 97.6 F (36.4 C)  98.1 F (36.7 C) 98.4 F (36.9 C)  TempSrc: Oral  Oral Oral  SpO2: 94%  91% 94%  Weight:  53.6 kg    Height:         PHYSICAL EXAM General: Well-nourished, well-developed elderly patient in no acute distress Psych:  Mood and affect appropriate for situation Respiratory:  Regular, unlabored respirations on room air   NEURO:  Mental Status: Patient responds to her name and follows all simple commands she is only able to name a few objects Speech/Language: speech is without dysarthria, but naming is impaired and patient has noted word finding difficulties  Cranial Nerves:  II: PERRL.  III, IV, VI: EOMI. Eyelids elevate symmetrically.  VII: Face is symmetrical resting and smiling VIII: hearing intact to voice. IX, X: Palate elevates symmetrically.  XII: tongue is midline  without fasciculations. Motor: Able to move all 4 extremities with good antigravity strength Tone: is normal and bulk is normal Sensation- Intact to light touch bilaterally.  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 CT head ICH in the left temporal lobe with mass effect on the left lateral ventricle, no significant midline shift and no hydrocephalus CTA head & neck pending MRI pending 2D Echo EF 60 to 65%, grade 1 diastolic dysfunction, normal left atrial size, no atrial level shunt LDL 182 HgbA1c 5.9 EEG: No seizures or epileptiform discharges seen VTE prophylaxis -SCDs aspirin 81 mg daily prior to admission, now on No antithrombotic due to ICH Therapy recommendations:  Pending Disposition: Pending  Hx of Stroke/TIA Patient has a history of a similar ICH in September 2024 with microhemorrhages seen on MRI  Hypertension Home meds: None Stable Keep systolic blood pressure less than 160  Hyperlipidemia Home meds: Ezetimibe 10 mg daily, resumed in hospital LDL 182, goal < 70 Add statin at discharge   Other Stroke Risk Factors None   Other Active Problems History of seizures-continue home Keppra  Hospital day # 1  Patient seen by NP with MD, MD to edit note as needed. Cortney E Ernestina Columbia , MSN, AGACNP-BC [facility] See Amion for schedule and pager information 11/23/2023 1:27 PM  I have personally  obtained history,examined this patient, reviewed notes, independently viewed imaging studies, participated in medical decision making and plan of care.ROS completed by me personally and pertinent positives fully documented  I have made any additions or clarifications directly to the above note. Agree with note above.  Patient clearly has vascular dementia likely related to her multiple intra cerebral hemorrhages, underlying cerebral amyloid  angiopathy.  She will require 24-hour care and family may not be able to provide this and is looking at skilled nursing placement would be appropriate.  No family available at the bedside.  Discussed with Dr. Sharon Seller.  Greater than 50% time during this 35-minute visit was spent in counseling and coordination of care discussion patient care team and answering questions.  Stroke team will sign off.  Delia Heady, MD Medical Director [facility] Pager: [phone] 11/23/2023 2:09 PM   To contact Stroke Continuity provider, please refer to [email or web address]. After hours, contact General Neurology

## 2023-11-23 NOTE — Evaluation (Signed)
Physical Therapy Evaluation Patient Details Name: Penny [name] MRN: [id number] DOB: [birth year] Today's Date: 11/23/2023  History of Present Illness  Patient is an 89 yo female presenting to the ED with AMS and loss of bowel and bladder control on 11/22/23. CT showing new left temporal lobe hemorrhage measuring up to 3.2 x 2.3 cm.  There is mass effect on the left lateral ventricle. No significant midline shift. No hydrocephalus.   PMH includes: COPD, HTN, HLD, OA, remote RUL lung cancer s/p partial lobectomy, recent left temporal ICH of unclear etiology in September 2024  Clinical Impression  Patient presents with decreased mobility due to generalized weakness, decreased balance, decreased strength, recent falls and continued fall risk.  Currently needing +2 mod A for sit to stand and stepping with RW to reclliner.  Feel she will need 24 hour supervision and continued rehab at d/c so recommend post-acute inpatient rehab (<3 hours/day).  PT will continue to follow in the acute setting.       If plan is discharge home, recommend the following: Supervision due to cognitive status;Assist for transportation;Assistance with cooking/housework;A lot of help with bathing/dressing/bathroom;A lot of help with walking and/or transfers   Can travel by private vehicle   No    Equipment Recommendations None recommended by PT  Recommendations for Other Services       Functional Status Assessment Patient has had a recent decline in their functional status and/or demonstrates limited ability to make significant improvements in function in a reasonable and predictable amount of time     Precautions / Restrictions Precautions Precautions: Fall Precaution Comments: SBP > 180 Restrictions Weight Bearing Restrictions Per Provider Order: No      Mobility  Bed Mobility Overal bed mobility: Needs Assistance Bed Mobility: Supine to Sit     Supine to sit: Mod assist     General bed mobility  comments: assist provided at trunk, decreased balance sitting EOB    Transfers Overall transfer level: Needs assistance Equipment used: 2 person hand held assist Transfers: Bed to chair/wheelchair/BSC, Sit to/from Stand Sit to Stand: Mod assist, +2 physical assistance, +2 safety/equipment Stand pivot transfers: Mod assist, +2 physical assistance, +2 safety/equipment         General transfer comment: Patient with shuffled gait during transfer, kyphotic posture, mod A for balance with anterior weight shift    Ambulation/Gait                  Stairs            Wheelchair Mobility     Tilt Bed    Modified Rankin (Stroke Patients Only)       Balance Overall balance assessment: Needs assistance Sitting-balance support: Bilateral upper extremity supported, Feet supported Sitting balance-Leahy Scale: Poor Sitting balance - Comments: up to mod A for balance sitting EOB, could not tolerate challenge Postural control: Right lateral lean Standing balance support: Bilateral upper extremity supported, During functional activity, Reliant on assistive device for balance Standing balance-Leahy Scale: Poor Standing balance comment: Reliant on PT/OT                             Pertinent Vitals/Pain Pain Assessment Pain Assessment: Faces Faces Pain Scale: No hurt Pain Intervention(s): Limited activity within patient's tolerance, Monitored during session, Repositioned    Home Living Family/patient expects to be discharged to:: Private residence Living Arrangements: Children Available Help at Discharge: Family;Available PRN/intermittently Type of Home: House Home Access:  Stairs to enter Entrance Stairs-Rails: None Entrance Stairs-Number of Steps: 1   Home Layout: One level Home Equipment:  ([email or web address]) Additional Comments: pt unreliable historian. all home set up/environment from prior admission.    Prior Function Prior Level of Function : Needs assist              Mobility Comments: per chart was walking with RW, patient unreliable historian ADLs Comments: unsure of current level of independence     Extremity/Trunk Assessment   Upper Extremity Assessment Upper Extremity Assessment: Defer to OT evaluation    Lower Extremity Assessment Lower Extremity Assessment: Generalized weakness    Cervical / Trunk Assessment Cervical / Trunk Assessment: Kyphotic  Communication   Communication Communication: Difficulty following commands/understanding Following commands: Follows one step commands inconsistently;Follows one step commands with increased time Cueing Techniques: Verbal cues;Gestural cues;Tactile cues;Visual cues  Cognition Arousal: Alert Behavior During Therapy: Flat affect Overall Cognitive Status: Impaired/Different from baseline Area of Impairment: Orientation, Memory, Following commands, Safety/judgement, Awareness, Problem solving, Attention                 Orientation Level: Place, Time, Situation Current Attention Level: Focused Memory: Decreased recall of precautions, Decreased short-term memory Following Commands: Follows one step commands inconsistently Safety/Judgement: Decreased awareness of safety, Decreased awareness of deficits Awareness: Intellectual Problem Solving: Slow processing, Decreased initiation, Difficulty sequencing, Requires verbal cues, Requires tactile cues General Comments: Patient able to respond to name, and respond "yes" when asked her birthdate, could not state birthdate, unable to state where she was. Patient tangential and mumbling, often with unintelligible phrases. Did accurately point out a bruise on her shin stating "i fell here"        General Comments General comments (skin integrity, edema, etc.): EEG just performed and noted glue, but no comb available to remove, RN in to deliver meds during session    Exercises     Assessment/Plan    PT Assessment Patient needs  continued PT services  PT Problem List Decreased strength;Decreased balance;Decreased mobility;Decreased activity tolerance;Decreased safety awareness       PT Treatment Interventions DME instruction;Gait training;Patient/family education;Functional mobility training;Therapeutic activities;Therapeutic exercise;Balance training    PT Goals (Current goals can be found in the Care Plan section)  Acute Rehab PT Goals Patient Stated Goal: unable to state PT Goal Formulation: Patient unable to participate in goal setting Time For Goal Achievement: 12/07/23 Potential to Achieve Goals: Fair    Frequency Min 1X/week     Co-evaluation PT/OT/SLP Co-Evaluation/Treatment: Yes Reason for Co-Treatment: Complexity of the patient's impairments (multi-system involvement);Necessary to address cognition/behavior during functional activity;For patient/therapist safety;To address functional/ADL transfers PT goals addressed during session: Mobility/safety with mobility;Balance;Proper use of DME OT goals addressed during session: ADL's and self-care;Strengthening/ROM       AM-PAC PT "6 Clicks" Mobility  Outcome Measure Help needed turning from your back to your side while in a flat bed without using bedrails?: Total Help needed moving from lying on your back to sitting on the side of a flat bed without using bedrails?: Total Help needed moving to and from a bed to a chair (including a wheelchair)?: Total Help needed standing up from a chair using your arms (e.g., wheelchair or bedside chair)?: Total Help needed to walk in hospital room?: Total Help needed climbing 3-5 steps with a railing? : Total 6 Click Score: 6    End of Session Equipment Utilized During Treatment: Gait belt Activity Tolerance: Patient tolerated treatment well Patient left: in chair;with call bell/phone within  reach;with chair alarm set;with nursing/sitter in room   PT Visit Diagnosis: Other abnormalities of gait and mobility  (R26.89);Muscle weakness (generalized) (M62.81)    Time: 1610-9604 PT Time Calculation (min) (ACUTE ONLY): 12 min   Charges:   PT Evaluation $PT Eval Moderate Complexity: 1 Mod   PT General Charges $$ ACUTE PT VISIT: 1 Visit         Sheran Lawless, PT Acute Rehabilitation Services Office:[phone] 11/23/2023   Elray Mcgregor 11/23/2023, 2:29 PM

## 2023-11-23 NOTE — Evaluation (Signed)
Occupational Therapy Evaluation Patient Details Name: Penny [name] MRN: [id number] DOB: [birth year] Today's Date: 11/23/2023   History of Present Illness Patient is an 89 yo female presenting to the ED with AMS and loss of bowel and bladder control on 11/22/23. CT showing new left temporal lobe hemorrhage measuring up to 3.2 x 2.3 cm.  There is mass effect on the left lateral ventricle. No significant midline shift. No hydrocephalus.   PMH includes: COPD, HTN, HLD, OA, remote RUL lung cancer s/p partial lobectomy, recent left temporal ICH of unclear etiology in September 2024   Clinical Impression   Patient presenting with significant confusion, and need for increased assist in order to complete ADL management and functional mobility. Patient unable to provide any home history, but pleasantly confused. Patient mod A for bed mobility, and then requiring mod A of 2 to complete step pivot to recliner (kyphotic posture and shuffled gait). Patient also mod to max A for ADL management. OT recommending rehab at lesser intensive venue < 3 hours in order to return to prior level of function. OT will continue to follow acutely.       If plan is discharge home, recommend the following: Two people to help with walking and/or transfers;Two people to help with bathing/dressing/bathroom;Assistance with cooking/housework;Direct supervision/assist for medications management;Direct supervision/assist for financial management;Assist for transportation;Help with stairs or ramp for entrance;Supervision due to cognitive status    Functional Status Assessment  Patient has had a recent decline in their functional status and demonstrates the ability to make significant improvements in function in a reasonable and predictable amount of time.  Equipment Recommendations  None recommended by OT    Recommendations for Other Services       Precautions / Restrictions Precautions Precautions: Fall Precaution  Comments: SBP > 180 Restrictions Weight Bearing Restrictions Per Provider Order: No      Mobility Bed Mobility Overal bed mobility: Needs Assistance Bed Mobility: Supine to Sit     Supine to sit: Mod assist     General bed mobility comments: assist provided at trunk, decreased balance sitting EOB    Transfers Overall transfer level: Needs assistance Equipment used: 2 person hand held assist Transfers: Bed to chair/wheelchair/BSC, Sit to/from Stand Sit to Stand: Mod assist, +2 physical assistance, +2 safety/equipment Stand pivot transfers: Mod assist, +2 physical assistance, +2 safety/equipment         General transfer comment: Patient with shuffled gait during transfer, kyphotic posture, poor balance      Balance Overall balance assessment: Needs assistance Sitting-balance support: Bilateral upper extremity supported, Feet supported Sitting balance-Leahy Scale: Poor Sitting balance - Comments: up to mod A for balance sitting EOB, could not tolerate challenge Postural control: Right lateral lean Standing balance support: Bilateral upper extremity supported, During functional activity, Reliant on assistive device for balance Standing balance-Leahy Scale: Poor Standing balance comment: Reliant on PT/OT                           ADL either performed or assessed with clinical judgement   ADL Overall ADL's : Needs assistance/impaired Eating/Feeding: Minimal assistance;Sitting   Grooming: Minimal assistance;Sitting   Upper Body Bathing: Moderate assistance;Sitting   Lower Body Bathing: Maximal assistance;Sitting/lateral leans;Sit to/from stand   Upper Body Dressing : Minimal assistance;Sitting   Lower Body Dressing: Maximal assistance;Sit to/from stand;Sitting/lateral leans   Toilet Transfer: Moderate assistance;+2 for physical assistance;+2 for safety/equipment;Stand-pivot;BSC/3in1 Toilet Transfer Details (indicate cue type and reason): simulated with  transfer with OT/PT Toileting- Clothing Manipulation and Hygiene: Maximal assistance;Sit to/from stand;Sitting/lateral lean       Functional mobility during ADLs: Moderate assistance;+2 for safety/equipment;+2 for physical assistance;Cueing for sequencing;Cueing for safety;Rolling walker (2 wheels) General ADL Comments: Patient presenting with significant confusion, and need for increased assist in order to complete ADL management and functional mobility. Patient unable to provide any home history, but pleasantly confused. Patient mod A for bed mobility, and then requiring mod A of 2 to complete step pivot to recliner (kyphotic posture and shuffled gait). Patient also mod to max A for ADL management. OT recommending rehab at lesser intensive venue < 3 hours in order to return to prior level of function. OT will continue to follow acutely.     Vision Patient Visual Report: Other (comment) Additional Comments: Cognition impaired, unable to get an assessment of vision     Perception Perception: Not tested       Praxis Praxis: Not tested       Pertinent Vitals/Pain Pain Assessment Pain Assessment: Faces Faces Pain Scale: No hurt Pain Intervention(s): Limited activity within patient's tolerance, Monitored during session, Repositioned     Extremity/Trunk Assessment Upper Extremity Assessment Upper Extremity Assessment: Defer to OT evaluation   Lower Extremity Assessment Lower Extremity Assessment: Defer to PT evaluation;Generalized weakness       Communication Communication Communication: Difficulty following commands/understanding Following commands: Follows one step commands with increased time;Follows one step commands inconsistently Cueing Techniques: Verbal cues;Gestural cues;Tactile cues;Visual cues   Cognition Arousal: Alert Behavior During Therapy: Flat affect Overall Cognitive Status: Impaired/Different from baseline Area of Impairment: Orientation, Memory, Following  commands, Safety/judgement, Awareness, Problem solving, Attention                 Orientation Level: Place, Time, Situation Current Attention Level: Focused Memory: Decreased recall of precautions, Decreased short-term memory Following Commands: Follows one step commands inconsistently Safety/Judgement: Decreased awareness of safety, Decreased awareness of deficits Awareness: Intellectual Problem Solving: Slow processing, Decreased initiation, Difficulty sequencing, Requires verbal cues, Requires tactile cues General Comments: Patient able to respond to name, and respond "yes" when asked her birthdate, could not state birthdate, unable to state where she was. Patient tangential and mumbling, often with unintelligible phrases. Did accurately point out a bruise on her shin stating "i fell here"     General Comments       Exercises     Shoulder Instructions      Home Living Family/patient expects to be discharged to:: Private residence Living Arrangements: Children Available Help at Discharge: Family;Available PRN/intermittently Type of Home: House Home Access: Stairs to enter [employer] of Steps: 1 Entrance Stairs-Rails: None Home Layout: One level     Bathroom Shower/Tub: Chief Strategy Officer: Standard     Home Equipment:  (hemiwalker)   Additional Comments: pt unreliable historian. all home set up/environment from prior admission.      Prior Functioning/Environment Prior Level of Function : Needs assist             Mobility Comments: per chart was walking with RW, patient unreliable historian ADLs Comments: unsure of current level of independence        OT Problem List: Decreased strength;Decreased range of motion;Decreased activity tolerance;Impaired balance (sitting and/or standing);Decreased coordination;Decreased cognition;Decreased safety awareness;Decreased knowledge of use of DME or AE;Decreased knowledge of precautions       OT Treatment/Interventions: Self-care/ADL training;Therapeutic exercise;Energy conservation;DME and/or AE instruction;Manual therapy;Therapeutic activities;Patient/family education;Balance training;Cognitive remediation/compensation    OT Goals(Current  goals can be found in the care plan section) Acute Rehab OT Goals Patient Stated Goal: unable OT Goal Formulation: Patient unable to participate in goal setting Time For Goal Achievement: 12/07/23 Potential to Achieve Goals: Fair  OT Frequency: Min 1X/week    Co-evaluation   Reason for Co-Treatment: Complexity of the patient's impairments (multi-system involvement);Necessary to address cognition/behavior during functional activity;For patient/therapist safety;To address functional/ADL transfers PT goals addressed during session: Mobility/safety with mobility;Balance;Proper use of DME OT goals addressed during session: ADL's and self-care;Strengthening/ROM      AM-PAC OT "6 Clicks" Daily Activity     Outcome Measure Help from another person eating meals?: A Little Help from another person taking care of personal grooming?: A Little Help from another person toileting, which includes using toliet, bedpan, or urinal?: A Lot Help from another person bathing (including washing, rinsing, drying)?: A Lot Help from another person to put on and taking off regular upper body clothing?: A Little Help from another person to put on and taking off regular lower body clothing?: A Lot 6 Click Score: 15   End of Session Equipment Utilized During Treatment: Gait belt Nurse Communication: Mobility status  Activity Tolerance: Patient limited by fatigue;Patient limited by lethargy Patient left: in chair;with call bell/phone within reach;with chair alarm set;with nursing/sitter in room  OT Visit Diagnosis: Unsteadiness on feet (R26.81);Other abnormalities of gait and mobility (R26.89);Repeated falls (R29.6);Muscle weakness (generalized) (M62.81);History of  falling (Z91.81);Other symptoms and signs involving cognitive function;Other symptoms and signs involving the nervous system (R29.898)                Time: 1610-9604 OT Time Calculation (min): 12 min Charges:  OT General Charges $OT Visit: 1 Visit OT Evaluation $OT Eval Moderate Complexity: 1 Mod  Pollyann Glen E. Peta Peachey, OTR/L Acute Rehabilitation Services [phone]   Cherlyn Cushing 11/23/2023, 1:44 PM

## 2023-11-23 NOTE — Procedures (Signed)
Patient Name: Priyana Tienda  MRN: [id number]  Epilepsy Attending: Charlsie Quest  Referring Physician/Provider: Elmer Picker, NP  Date: 11/23/2023 Duration: 22.59 mins  Patient history: 89 year old female presented with history of temporal hemorrhage presented with an episode of incontinence and speech disturbance.  EEG to evaluate for seizure.  Level of alertness: Awake  AEDs during EEG study: LEV  Technical aspects: This EEG study was done with scalp electrodes positioned according to the 10-20 International system of electrode placement. Electrical activity was reviewed with band pass filter of 1-70Hz , sensitivity of 7 uV/mm, display speed of 65mm/sec with a 60Hz  notched filter applied as appropriate. EEG data were recorded continuously and digitally stored.  Video monitoring was available and reviewed as appropriate.  Description: The posterior dominant rhythm consists of 8 Hz activity of moderate voltage (25-35 uV) seen predominantly in posterior head regions, symmetric and reactive to eye opening and eye closing. EEG showed continuous 3 to 6 Hz theta-delta slowing in left fronto-temporal region. Intermittent generalized 3-5Hz  theta- delta slowing was also noted. Hyperventilation and photic stimulation were not performed.     ABNORMALITY - Continuous slow, left frontotemporal region - Intermittent slow, generalized  IMPRESSION: This study is suggestive of cortical dysfunction arising from left frontotemporal region likely secondary to underlying hemorrhage.  Additionally there is mild diffuse encephalopathy.  No seizures or definite epileptiform discharges were seen throughout the recording.  Kaylamarie Swickard Annabelle Harman

## 2023-11-24 DIAGNOSIS — I629 Nontraumatic intracranial hemorrhage, unspecified: Secondary | ICD-10-CM | POA: Diagnosis not present

## 2023-11-24 LAB — BASIC METABOLIC PANEL
Anion gap: 10 (ref 5–15)
BUN: 13 mg/dL (ref 8–23)
CO2: 27 mmol/L (ref 22–32)
Calcium: 8.8 mg/dL — ABNORMAL LOW (ref 8.9–10.3)
Chloride: 103 mmol/L (ref 98–111)
Creatinine, Ser: 0.86 mg/dL (ref 0.44–1.00)
GFR, Estimated: >60 mL/min (ref >60)
Glucose, Bld: 122 mg/dL — ABNORMAL HIGH (ref 70–99)
Potassium: 3.5 mmol/L (ref 3.5–5.1)
Sodium: 140 mmol/L (ref 135–145)

## 2023-11-24 LAB — MAGNESIUM: Magnesium: 1.5 mg/dL — ABNORMAL LOW (ref 1.7–2.4)

## 2023-11-24 MED ORDER — POTASSIUM CHLORIDE CRYS ER 20 MEQ PO TBCR
40.0000 meq | EXTENDED_RELEASE_TABLET | Freq: Once | ORAL | Status: AC
  Administered 2023-11-24: 40 meq via ORAL
  Filled 2023-11-24: qty 2

## 2023-11-24 MED ORDER — MAGNESIUM SULFATE 2 GM/50ML IV SOLN
2.0000 g | Freq: Once | INTRAVENOUS | Status: AC
  Administered 2023-11-24: 2 g via INTRAVENOUS
  Filled 2023-11-24: qty 50

## 2023-11-24 MED ORDER — ATORVASTATIN CALCIUM 10 MG PO TABS
10.0000 mg | ORAL_TABLET | Freq: Every day | ORAL | Status: DC
  Administered 2023-11-24 – 2023-11-27 (×4): 10 mg via ORAL
  Filled 2023-11-24 (×4): qty 1

## 2023-11-24 MED ORDER — MAGNESIUM GLUCONATE 500 MG PO TABS
500.0000 mg | ORAL_TABLET | Freq: Two times a day (BID) | ORAL | Status: DC
  Administered 2023-11-24 – 2023-11-27 (×6): 500 mg via ORAL
  Filled 2023-11-24 (×7): qty 1

## 2023-11-24 NOTE — NC FL2 (Signed)
[place] MEDICAID FL2 LEVEL OF CARE FORM     IDENTIFICATION  Patient Name: Penny [name] Birthdate: Jun 09, 1933 Sex: female Admission Date (Current Location): 11/22/2023  [facility] and [place] Number:  Producer, television/film/video and Address:  The . [facility], [address], [place], [place] [address]      Provider Number: [id number]  Attending Physician Name and Address:  Lonia Blood, MD  Relative Name and Phone Number:       Current Level of Care: Hospital Recommended Level of Care: Skilled Nursing Facility Prior Approval Number:    Date Approved/Denied:   PASRR Number: [id number]  Discharge Plan: SNF    Current Diagnoses: Patient Active Problem List   Diagnosis Date Noted   Seizure (HCC) 11/22/2023   White matter disease, unspecified 08/20/2023   ICH (intracerebral hemorrhage) (HCC) 08/06/2023   Hemorrhagic stroke (HCC) 08/06/2023   Intracranial hemorrhage (HCC) 06/01/2023   Closed fracture of left proximal humerus 06/01/2023   Fall at home, initial encounter 06/01/2023   History of COPD 06/01/2023   Allergic rhinitis 06/01/2023   Depression 06/01/2023   Intraparenchymal hematoma of brain (HCC) 06/01/2023   Closed fracture of shaft of left humerus 06/01/2023   Rigidity (muscles) 05/21/2023   Traumatic head injury less than 3 months ago 05/21/2023   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   Malignant neoplasm of upper lobe of lung, unspecified laterality (HCC) 01/07/2023   Syncope    Symptomatic sinus bradycardia 09/10/2022   Hypokalemia 09/10/2022   Hypomagnesemia 09/10/2022   CAD (coronary artery disease) 09/10/2022   COVID-19 (11/30/2021) 12/01/2021   B12 deficiency 06/28/2020   Abnormal glucose 10/09/2018   Osteoporosis 12/28/2015   COPD (chronic obstructive pulmonary disease) with emphysema (HCC) 02/25/2015   Atherosclerosis of aorta (HCC) BY ct SCAN IN 2015 02/25/2015   Essential hypertension    Hyperlipidemia, mixed     Vitamin D deficiency    Personal history of lung cancer 12/04/1996    Orientation RESPIRATION BLADDER Height & Weight     Self  Normal Continent Weight: 118 lb 2.7 oz (53.6 kg) Height:  5\' 2"  (157.5 cm)  BEHAVIORAL SYMPTOMS/MOOD NEUROLOGICAL BOWEL NUTRITION STATUS      Continent Diet (See DC summary)  AMBULATORY STATUS COMMUNICATION OF NEEDS Skin   Extensive Assist Verbally Skin abrasions (R leg Abrasion)                       Personal Care Assistance Level of Assistance  Bathing, Feeding, Dressing Bathing Assistance: Maximum assistance Feeding assistance: Maximum assistance Dressing Assistance: Maximum assistance     Functional Limitations Info  Sight, Hearing, Speech Sight Info: Impaired Hearing Info: Adequate Speech Info: Adequate    SPECIAL CARE FACTORS FREQUENCY  PT (By licensed PT), OT (By licensed OT)     PT Frequency: 5x week OT Frequency: 5x week            Contractures Contractures Info: Not present    Additional Factors Info  Code Status, Allergies, Psychotropic Code Status Info: DNR Allergies Info: Ace Inhibitors  Calcium-containing Compounds  Polysporin (Bacitracin-polymyxin B) Psychotropic Info: Bupropion         Current Medications (11/24/2023):  This is the current hospital active medication list Current Facility-Administered Medications  Medication Dose Route Frequency Provider Last Rate Last Admin   acetaminophen (TYLENOL) tablet 650 mg  650 mg Oral Q4H PRN Darreld Mclean R, MD       albuterol (PROVENTIL) (2.5 MG/3ML) 0.083% nebulizer solution  3 mL  3 mL Inhalation Q6H PRN Charlsie Quest, MD       atorvastatin (LIPITOR) tablet 10 mg  10 mg Oral Daily Lonia Blood, MD       buPROPion [facility]) tablet 75 mg  75 mg Oral Daily Darreld Mclean R, MD   75 mg at 11/23/23 1031   ezetimibe (ZETIA) tablet 10 mg  10 mg Oral Daily Darreld Mclean R, MD   10 mg at 11/23/23 1031   hydrALAZINE (APRESOLINE) injection 5 mg  5 mg Intravenous Q4H PRN  Charlsie Quest, MD       levETIRAcetam (KEPPRA) tablet 500 mg  500 mg Oral BID Darreld Mclean R, MD   500 mg at 11/23/23 2204   montelukast (SINGULAIR) tablet 10 mg  10 mg Oral q morning Darreld Mclean R, MD   10 mg at 11/23/23 1031   potassium chloride SA (KLOR-CON M) CR tablet 40 mEq  40 mEq Oral Once Lonia Blood, MD       senna-docusate (Senokot-S) tablet 1 tablet  1 tablet Oral QHS PRN Charlsie Quest, MD         Discharge Medications: Please see discharge summary for a list of discharge medications.  Relevant Imaging Results:  Relevant Lab Results:   Additional Information SS# [id number] [place]

## 2023-11-24 NOTE — Progress Notes (Signed)
STROKE TEAM PROGRESS NOTE   BRIEF HPI [name] is a 89 y.o. female with history of COPD, hypertension, hyperlipidemia, lung cancer, osteoporosis, seizures, shingles and vitamin D deficiency presenting with confusion, behavioral changes, urinary incontinence and expressive aphasia.  She did have a few recent falls.  She was found to have a left temporal lobe ICH, likely subacute on head CT.  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  NIH on Admission 4  INTERIM HISTORY/SUBJECTIVE Patient remains pleasantly demented with some word finding difficulties and poor insight into her condition.  She at times can speak fluently but at times she gets stuck with certain words. Patient has remained hemodynamically stable overnight, and her EEG demonstrated no seizures or epileptiform discharges.No changes  OBJECTIVE  CBC    Component Value Date/Time   WBC 6.0 11/23/2023 0357   RBC 4.45 11/23/2023 0357   HGB 12.5 11/23/2023 0357   HCT 38.3 11/23/2023 0357   PLT 180 11/23/2023 0357   MCV 86.1 11/23/2023 0357   MCH 28.1 11/23/2023 0357   MCHC 32.6 11/23/2023 0357   RDW 14.3 11/23/2023 0357   LYMPHSABS 1.5 11/22/2023 1611   MONOABS 0.8 11/22/2023 1611   EOSABS 0.0 11/22/2023 1611   BASOSABS 0.0 11/22/2023 1611    BMET    Component Value Date/Time   NA 140 11/24/2023 0528   K 3.5 11/24/2023 0528   CL 103 11/24/2023 0528   CO2 27 11/24/2023 0528   GLUCOSE 122 (H) 11/24/2023 0528   BUN 13 11/24/2023 0528   CREATININE 0.86 11/24/2023 0528   CREATININE 0.67 10/29/2023 1113   CALCIUM 8.8 (L) 11/24/2023 0528   EGFR 84 10/29/2023 1113   GFRNONAA >60 11/24/2023 0528   GFRNONAA 59 (L) 02/07/2021 1450    IMAGING past 24 hours MR BRAIN WO CONTRAST Result Date: 11/23/2023 CLINICAL DATA:  Hemorrhage with incontinence and speech disturbance EXAM: MRI HEAD WITHOUT CONTRAST TECHNIQUE: Multiplanar, multiecho pulse sequences of the brain and surrounding structures were obtained without intravenous  contrast. COMPARISON:  08/06/2023 brain MRI 11/22/2023 head CT FINDINGS: The examination was terminated prior to completion due to patient difficulty holding still. Brain: No acute infarct visible. Large area of hemorrhage in the left anterior temporal lobe, unchanged compared to the 11/22/2023 head CT. There is confluent hyperintense T2-weighted signal within the white matter. Generalized volume loss. Vascular: Normal flow voids. Skull and upper cervical spine: Normal calvarium and skull base. Visualized upper cervical spine and soft tissues are normal. Sinuses/Orbits:No paranasal sinus fluid levels or advanced mucosal thickening. No mastoid or middle ear effusion. Normal orbits. IMPRESSION: 1. The examination was terminated prior to completion due to patient difficulty holding still. 2. Large area of hemorrhage in the left anterior temporal lobe, unchanged compared to the 11/22/2023 head CT. 3. No acute infarct. Electronically Signed   By: Deatra Robinson M.D.   On: 11/23/2023 19:49    Vitals:   11/23/23 2016 11/23/23 2354 11/24/23 0337 11/24/23 0800  BP: (!) 150/95 (!) 137/95 (!) 134/104 107/85  Pulse:  93 93 96  Resp: 18 16 16    Temp: 99 F (37.2 C) 98.2 F (36.8 C) 98.1 F (36.7 C) 98 F (36.7 C)  TempSrc: Oral Oral Oral Oral  SpO2: 93% 92% 91% 96%  Weight:      Height:         PHYSICAL EXAM General: Well-nourished, well-developed elderly patient in no acute distress Psych:  Mood and affect appropriate for situation Respiratory:  Regular, unlabored respirations on  room air   NEURO:  Mental Status: Patient responds to her name and follows all simple commands she is only able to name a few objects Speech/Language: speech is without dysarthria, but naming is impaired and patient has noted word finding difficulties  Cranial Nerves:  II: PERRL.  III, IV, VI: EOMI. Eyelids elevate symmetrically.  VII: Face is symmetrical resting and smiling VIII: hearing intact to voice. IX, X: Palate  elevates symmetrically.  XII: tongue is midline without fasciculations. Motor: Able to move all 4 extremities with good antigravity strength Tone: is normal and bulk is normal Sensation- Intact to light touch bilaterally.  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 CT head ICH in the left temporal lobe with mass effect on the left lateral ventricle, no significant midline shift and no hydrocephalus CTA head & neck pending MRI pending 2D Echo EF 60 to 65%, grade 1 diastolic dysfunction, normal left atrial size, no atrial level shunt LDL 182 HgbA1c 5.9 EEG: No seizures or epileptiform discharges seen VTE prophylaxis -SCDs aspirin 81 mg daily prior to admission, now on No antithrombotic due to ICH Therapy recommendations:  Pending Disposition: Pending  Hx of Stroke/TIA Patient has a history of a similar ICH in September 2024 with microhemorrhages seen on MRI  Hypertension Home meds: None Stable Keep systolic blood pressure less than 160  Hyperlipidemia Home meds: Ezetimibe 10 mg daily, resumed in hospital LDL 182, goal < 70 Add statin at discharge   Other Stroke Risk Factors None   Other Active Problems History of seizures-continue home Keppra    Patient clearly has vascular dementia likely related to her multiple intra cerebral hemorrhages, underlying cerebral amyloid angiopathy.  She will require 24-hour care and family may not be able to provide this and is looking at skilled nursing placement would be appropriate.  No family available at the bedside.  Discussed with Dr. Sharon Seller.  Greater than 50% time during this 25-minute visit was spent in counseling and coordination of care discussion patient care team and answering questions.  Stroke team will sign off. D/w Dr Sharon Seller  Delia Heady, MD Medical Director [facility] Pager:  [phone] 11/24/2023 3:11 PM   To contact Stroke Continuity provider, please refer to [email or web address]. After hours, contact General Neurology

## 2023-11-24 NOTE — TOC Initial Note (Signed)
Transition of Care [facility]) - Initial/Assessment Note    Patient Details  Name: Penny [name] MRN: [id number] Date of Birth: 1932-12-26  Transition of Care [facility]) CM/SW Contact:    Carley Hammed, LCSW Phone Number: 11/24/2023, 9:39 AM  Clinical Narrative:                 CSW noting pt is disoriented and recommended for SNF placement at [place]. CSW spoke with dtr Dava Najjar, who confirms pt lives with her and is usually fairly independent. Sandee states they are agreeable to SNF and pt has been to [facility] and [facility] in the past. Dtr would prefer [facility] but notes [facility] is too far away, agreeable to be faxed out for options. Questions about Medicare coverage and LTC were answered. TOC will continue to follow.   Expected Discharge Plan: Skilled Nursing Facility Barriers to Discharge: Insurance Authorization, SNF Pending bed offer, Continued Medical Work up   Patient Goals and CMS Choice Patient states their goals for this hospitalization and ongoing recovery are:: Pt is disoriented and unable to participate in goal setting. CMS Medicare.gov Compare Post Acute Care list provided to:: Patient Represenative (must comment) Choice offered to / list presented to : Adult Children      Expected Discharge Plan and Services In-house Referral: Clinical Social Work   Post Acute Care Choice: Skilled Nursing Facility Living arrangements for the past 2 months: Single Family Home                                      Prior Living Arrangements/Services Living arrangements for the past 2 months: Single Family Home Lives with:: Adult Children Patient language and need for interpreter reviewed:: Yes Do you feel safe going back to the place where you live?: Yes      Need for Family Participation in Patient Care: Yes (Comment) Care giver support system in place?: Yes (comment) Current home services: DME Criminal Activity/Legal Involvement Pertinent to Current  Situation/Hospitalization: No - Comment as needed  Activities of Daily Living      Permission Sought/Granted Permission sought to share information with : Family Supports Permission granted to share information with : Yes, Verbal Permission Granted  Share Information with NAME: Sandee     Permission granted to share info w Relationship: Daughter     Emotional Assessment Appearance:: Appears stated age Attitude/Demeanor/Rapport: Unable to Assess Affect (typically observed): Unable to Assess Orientation: : Oriented to Self Alcohol / Substance Use: Not Applicable Psych Involvement: No (comment)  Admission diagnosis:  Intracranial hemorrhage (HCC) [I62.9] Patient Active Problem List   Diagnosis Date Noted   Seizure (HCC) 11/22/2023   White matter disease, unspecified 08/20/2023   ICH (intracerebral hemorrhage) (HCC) 08/06/2023   Hemorrhagic stroke (HCC) 08/06/2023   Intracranial hemorrhage (HCC) 06/01/2023   Closed fracture of left proximal humerus 06/01/2023   Fall at home, initial encounter 06/01/2023   History of COPD 06/01/2023   Allergic rhinitis 06/01/2023   Depression 06/01/2023   Intraparenchymal hematoma of brain (HCC) 06/01/2023   Closed fracture of shaft of left humerus 06/01/2023   Rigidity (muscles) 05/21/2023   Traumatic head injury less than 3 months ago 05/21/2023   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   Malignant neoplasm of upper lobe of lung, unspecified laterality (HCC) 01/07/2023   Syncope    Symptomatic sinus bradycardia 09/10/2022   Hypokalemia 09/10/2022   Hypomagnesemia 09/10/2022   CAD (coronary  artery disease) 09/10/2022   COVID-19 (11/30/2021) 12/01/2021   B12 deficiency 06/28/2020   Abnormal glucose 10/09/2018   Osteoporosis 12/28/2015   COPD (chronic obstructive pulmonary disease) with emphysema (HCC) 02/25/2015   Atherosclerosis of aorta (HCC) BY ct SCAN IN 2015 02/25/2015   Essential hypertension    Hyperlipidemia, mixed    Vitamin D  deficiency    Personal history of lung cancer 12/04/1996   PCP:  Lucky Cowboy, MD Pharmacy:   [facility] [id number] [place], [place] - [address] [place] [address] Phone: [phone] Fax: [phone]     Social Drivers of Health (SDOH) Social History: SDOH Screenings   Food Insecurity: No Food Insecurity (11/23/2023)  Housing: Low Risk  (11/23/2023)  Transportation Needs: No Transportation Needs (11/23/2023)  Utilities: Not At Risk (11/23/2023)  Depression (PHQ2-9): Medium Risk (05/14/2023)  Tobacco Use: Medium Risk (11/22/2023)   SDOH Interventions:     Readmission Risk Interventions     No data to display

## 2023-11-24 NOTE — Progress Notes (Signed)
Penny [name]  FAO:[id number] DOB: [birth year] DOA: 11/22/2023 PCP: Lucky Cowboy, MD    Brief Narrative:  89 year old with a history of COPD, HTN, HLD, OA, RUL lung cancer status post lobectomy, right frontal cortex traumatic IPH June 2024, and a left temporal ICH of unclear etiology Sept 2024 who presented to the ER 12/19 with the acute onset of word finding difficulty over a 24-48-hour timeframe.  Goals of Care:   Code Status: Limited: Do not attempt resuscitation (DNR) -DNR-LIMITED -Do Not Intubate/DNI    DVT prophylaxis: SCD's Start: 11/22/23 1937  Interim Hx: No acute events recorded overnight.  PT/OT have evaluated the patient and are recommending SNF rehab.  No new complaints today.  Alert and interactive.  Appears comfortable with no evidence of distress.  Assessment & Plan:  Left temporal lobe intracranial hemorrhage - cerebral amyloid angiopathy Care per Neurology - clinical deficits include expressive aphasia increased incontinence and generalized confusion - presentation similar to that in September 2024 - suspected to be related to amyloid angiopathy - EEG without evidence of active seizure -was on aspirin prior to admission which is currently on hold  Vascular dementia Related to multiple CNS insults dating back to June 2024  Seizure disorder Continue usual seizure medication initiated during September 2024 admission  HTN Blood pressure controlled at present, with goal being SBP <160  HLD LDL 182 with goal of <70 - statin added - was previously on ezetimibe only  Modest hypokalemia Likely due to poor intake -corrected with supplementation  Hypomagnesemia Likely due to poor intake -supplement and follow  COPD Stable at present  Recent T12 compression fracture Status post IR guided balloon kyphoplasty 10/10/2023  History of lung cancer status post partial lobectomy  Family Communication: Spoke with daughter at bedside at length Disposition: Will  require SNF -medically stable for discharge when bed can be identified   Objective: Blood pressure 107/85, pulse 96, temperature 98 F (36.7 C), temperature source Oral, resp. rate 16, height 5\' 2"  (1.575 m), weight 53.6 kg, SpO2 96%. No intake or output data in the 24 hours ending 11/24/23 0906  Filed Weights   11/23/23 0600  Weight: 53.6 kg    Examination: General: No acute respiratory distress Lungs: Clear to auscultation bilaterally without wheezes or crackles Cardiovascular: Regular rate and rhythm without murmur gallop or rub normal S1 and S2 Abdomen: NT/ND, soft, BS positive, no rebound Extremities: No significant cyanosis, clubbing, or edema bilateral lower extremities  CBC: Recent Labs  Lab 11/22/23 1611 11/22/23 1636 11/23/23 0357  WBC 6.7  --  6.0  NEUTROABS 4.4  --   --   HGB 13.2 13.9 12.5  HCT 40.9 41.0 38.3  MCV 86.3  --  86.1  PLT 213  --  180   Basic Metabolic Panel: Recent Labs  Lab 11/22/23 1611 11/22/23 1636 11/23/23 0357 11/24/23 0528  NA 141 141 142 140  K 3.5 3.4* 3.1* 3.5  CL 104 102 105 103  CO2 26  --  24 27  GLUCOSE 118* 117* 116* 122*  BUN 11 15 9 13   CREATININE 0.77 0.70 0.65 0.86  CALCIUM 9.5  --  9.2 8.8*  MG  --   --   --  1.5*   GFR: Estimated Creatinine Clearance: 35.8 mL/min (by C-G formula based on SCr of 0.86 mg/dL).   Scheduled Meds:  buPROPion  75 mg Oral Daily   ezetimibe  10 mg Oral Daily   levETIRAcetam  500 mg Oral BID  montelukast  10 mg Oral q morning      LOS: 2 days   Lonia Blood, MD [facility] Office  [phone] Pager - Text Page per Amion  If 7PM-7AM, please contact night-coverage per Amion 11/24/2023, 9:06 AM

## 2023-11-25 DIAGNOSIS — I629 Nontraumatic intracranial hemorrhage, unspecified: Secondary | ICD-10-CM | POA: Diagnosis not present

## 2023-11-25 LAB — BASIC METABOLIC PANEL
Anion gap: 7 (ref 5–15)
BUN: 12 mg/dL (ref 8–23)
CO2: 27 mmol/L (ref 22–32)
Calcium: 9 mg/dL (ref 8.9–10.3)
Chloride: 105 mmol/L (ref 98–111)
Creatinine, Ser: 0.7 mg/dL (ref 0.44–1.00)
GFR, Estimated: >60 mL/min (ref >60)
Glucose, Bld: 111 mg/dL — ABNORMAL HIGH (ref 70–99)
Potassium: 4 mmol/L (ref 3.5–5.1)
Sodium: 139 mmol/L (ref 135–145)

## 2023-11-25 LAB — MAGNESIUM: Magnesium: 2 mg/dL (ref 1.7–2.4)

## 2023-11-25 NOTE — Evaluation (Signed)
Speech Language Pathology Evaluation Patient Details Name: Shaniesha Euresti MRN: [id number] DOB: 31-Jul-1933 Today's Date: 11/25/2023 Time: 9147-8295 SLP Time Calculation (min) (ACUTE ONLY): 31 min  Problem List:  Patient Active Problem List   Diagnosis Date Noted   Seizure (HCC) 11/22/2023   White matter disease, unspecified 08/20/2023   ICH (intracerebral hemorrhage) (HCC) 08/06/2023   Hemorrhagic stroke (HCC) 08/06/2023   Intracranial hemorrhage (HCC) 06/01/2023   Closed fracture of left proximal humerus 06/01/2023   Fall at home, initial encounter 06/01/2023   History of COPD 06/01/2023   Allergic rhinitis 06/01/2023   Depression 06/01/2023   Intraparenchymal hematoma of brain (HCC) 06/01/2023   Closed fracture of shaft of left humerus 06/01/2023   Rigidity (muscles) 05/21/2023   Traumatic head injury less than 3 months ago 05/21/2023   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   Malignant neoplasm of upper lobe of lung, unspecified laterality (HCC) 01/07/2023   Syncope    Symptomatic sinus bradycardia 09/10/2022   Hypokalemia 09/10/2022   Hypomagnesemia 09/10/2022   CAD (coronary artery disease) 09/10/2022   COVID-19 (11/30/2021) 12/01/2021   B12 deficiency 06/28/2020   Abnormal glucose 10/09/2018   Osteoporosis 12/28/2015   COPD (chronic obstructive pulmonary disease) with emphysema (HCC) 02/25/2015   Atherosclerosis of aorta (HCC) BY ct SCAN IN 2015 02/25/2015   Essential hypertension    Hyperlipidemia, mixed    Vitamin D deficiency    Personal history of lung cancer 12/04/1996   Past Medical History:  Past Medical History:  Diagnosis Date   COPD (chronic obstructive pulmonary disease) with emphysema (HCC)    COVID-19 12/09/2019   Hyperlipidemia    Hypertension    Lung cancer, upper lobe (HCC) 12/04/1996   RUL   Osteoporosis    S/P partial lobectomy of lung    RUL1998   Thigh shingles 12/04/1996   Thoracic aorta atherosclerosis (HCC)    Vitamin D deficiency     Past Surgical History:  Past Surgical History:  Procedure Laterality Date   CARPAL TUNNEL RELEASE Right    2009   IR KYPHO THORACIC WITH BONE BIOPSY  10/10/2023   IR RADIOLOGIST EVAL & MGMT  10/08/2023   LUNG REMOVAL, PARTIAL Right    RUL 1998   OLECRANON BURSECTOMY Left 03/20/2017   Procedure: EXICISION OLECRANON BURSA LEFT ELBOW;  Surgeon: Cindee Salt, MD;  Location: [facility];  Service: Orthopedics;  Laterality: Left;  with scb block in preop   SHOULDER ADHESION RELEASE Right    2004   HPI:  Ms. Cithlaly Sturgis is a 89 y.o. female with history of COPD, hypertension, hyperlipidemia, lung cancer, osteoporosis, seizures, shingles and vitamin D deficiency presenting with confusion, behavioral changes, urinary incontinence and expressive aphasia.  She did have a few recent falls.  She was found to have a large area of hemorrhage in the left anterior temporal lobe, likely subacute on head CT. ST consulted for speech/language assessment.   Assessment / Plan / Recommendation Clinical Impression  Pt seen for speech/language cognitive assessment with expressive/receptive aphasia present c/b decreased responsive and divergent/convergent naming tasks.  Phonemic errors (ie: "elesant" for "elephant" and speech peppered with recurrent neologisms and language of confusion.  Pt would state phrases such as "I can't talk straight" but then be unable to answer simple questions when asked with auditory comprehension impacted periodically.  OME appeared unremarkable, but pt unable to follow multi-step commands during assessment.  [employer] Mental Status Examination (SLUMS) administered, but d/t expressive aphasia, this may not be  accurate for determining current cognitive function.  Score obtained was 11/26 (typical score on this assessment is 27/30), but  written task eliminated d/t pt tremoring and legibility impacted.  Tasks proven to be difficult were digit reversal, orientation  questions, simple calculation, naming animals in category and paragraph retention/recall.  No family available to determine prior level of functioning.  Reduced insight to deficits also observed.  Recommend ST f/u for expressive/receptive linguistic needs and ongoing cognitive assessment/determining PLOF during acute stay.  Thank you for this consult.    SLP Assessment  SLP Recommendation/Assessment: Patient needs continued Speech Language Pathology Services SLP Visit Diagnosis: Attention and concentration deficit;Cognitive communication deficit (R41.841);Aphasia (R47.01) Attention and concentration deficit following: Nontraumatic intracerebral hemorrhage    Recommendations for follow up therapy are one component of a multi-disciplinary discharge planning process, led by the attending physician.  Recommendations may be updated based on patient status, additional functional criteria and insurance authorization.    Follow Up Recommendations  Follow physician's recommendations for discharge plan and follow up therapies    Assistance Recommended at Discharge  Frequent or constant Supervision/Assistance  Functional Status Assessment Patient has had a recent decline in their functional status and demonstrates the ability to make significant improvements in function in a reasonable and predictable amount of time.  Frequency and Duration min 2x/week  1 week      SLP Evaluation Cognition  Overall Cognitive Status: No family/caregiver present to determine baseline cognitive functioning Arousal/Alertness: Awake/alert Orientation Level: Oriented to person;Oriented to place;Disoriented to situation;Disoriented to time (stated hospital, but not [place]) Year: Other (Comment) (1987) Day of Week: Correct Attention: Sustained Sustained Attention: Impaired Sustained Attention Impairment: Verbal basic;Functional basic Memory: Impaired Memory Impairment: Retrieval deficit;Decreased recall of new  information;Decreased short term memory Decreased Short Term Memory: Verbal basic;Functional basic Awareness: Impaired Awareness Impairment: Intellectual impairment Problem Solving:  (DTA d/t expressive aphasia) Executive Function:  (DTA) Behaviors: Perseveration Safety/Judgment: Other (comment) (DTA) Comments: expressive aphasia/auditory comprehension impacting cognitive assessment       Comprehension  Auditory Comprehension Overall Auditory Comprehension: Impaired Yes/No Questions: Within Functional Limits Commands: Impaired Multistep Basic Commands: 0-24% accurate Conversation: Simple Other Conversation Comments: expressive aphasia impacts overall Interfering Components: Attention;Processing speed;Other (comment) (auditory comprehension) EffectiveTechniques: Extra processing time;Repetition Visual Recognition/Discrimination Discrimination: Not tested Reading Comprehension Reading Status: Not tested    Expression Expression Primary Mode of Expression: Verbal Verbal Expression Overall Verbal Expression: Impaired Initiation: Impaired Level of Generative/Spontaneous Verbalization: Sentence Repetition: Impaired Level of Impairment: Word level Naming: Impairment Responsive: 51-75% accurate Confrontation: Impaired Convergent: 50-74% accurate Divergent: 0-24% accurate Other Naming Comments: neologisms, phonemic errors Verbal Errors: Phonemic paraphasias;Neologisms;Confabulation;Perseveration;Language of confusion Interfering Components: Attention Non-Verbal Means of Communication: Not applicable Written Expression Dominant Hand: Right Written Expression: Unable to assess (comment)   Oral / Motor  Oral Motor/Sensory Function Overall Oral Motor/Sensory Function: Within functional limits Motor Speech Overall Motor Speech:  (DTA) Respiration: Within functional limits Phonation: Normal Resonance: Within functional limits Articulation: Within functional  limitis Intelligibility: Intelligible            Pat Myka Lukins,M.S.,CCC-SLP 11/25/2023, 1:28 PM

## 2023-11-25 NOTE — Progress Notes (Signed)
Penny [name]  RCV:[id number] DOB: [birth year] DOA: 11/22/2023 PCP: Lucky Cowboy, MD    Brief Narrative:  89 year old with a history of COPD, HTN, HLD, OA, RUL lung cancer status post lobectomy, right frontal cortex traumatic IPH June 2024, and a left temporal ICH of unclear etiology Sept 2024 who presented to the ER 12/19 with the acute onset of word finding difficulty over a 24-48-hour timeframe.  Goals of Care:   Code Status: Limited: Do not attempt resuscitation (DNR) -DNR-LIMITED -Do Not Intubate/DNI    DVT prophylaxis: SCD's Start: 11/22/23 1937  Interim Hx: No acute events recorded overnight.  Resting comfortably in bed.  Vital signs stable.  Afebrile.  In good spirits today with no complaints.  She  Assessment & Plan:  Left temporal lobe intracranial hemorrhage - cerebral amyloid angiopathy Care per Neurology - clinical deficits include expressive aphasia increased incontinence and generalized confusion - presentation similar to that in September 2024 - suspected to be related to amyloid angiopathy - EEG without evidence of active seizure -was on aspirin prior to admission which is currently on hold  Vascular dementia Related to multiple CNS insults dating back to June 2024  Seizure disorder Continue usual seizure medication initiated during September 2024 admission  HTN Blood pressure controlled at present, with goal being SBP <160  HLD LDL 182 with goal of <70 - statin added - was previously on ezetimibe only  Modest hypokalemia Likely due to poor intake -corrected with supplementation and stable  Hypomagnesemia Likely due to poor intake -corrected with supplementation  COPD Stable at present  Recent T12 compression fracture Status post IR guided balloon kyphoplasty 10/10/2023  History of lung cancer status post partial lobectomy  Family Communication:  Disposition: Will require SNF -medically stable for discharge when bed can be  identified   Objective: Blood pressure 137/84, pulse 80, temperature 98 F (36.7 C), temperature source Oral, resp. rate 16, height 5\' 2"  (1.575 m), weight 53.6 kg, SpO2 96%.  Intake/Output Summary (Last 24 hours) at 11/25/2023 1001 Last data filed at 11/24/2023 1300 Gross per 24 hour  Intake 240 ml  Output --  Net 240 ml    Filed Weights   11/23/23 0600  Weight: 53.6 kg    Examination: General: No acute respiratory distress Lungs: Clear to auscultation bilaterally without wheezes or crackles Cardiovascular: RRR without murmur Abdomen: NT/ND, soft, BS positive, no rebound Extremities: No significant edema BLE  CBC: Recent Labs  Lab 11/22/23 1611 11/22/23 1636 11/23/23 0357  WBC 6.7  --  6.0  NEUTROABS 4.4  --   --   HGB 13.2 13.9 12.5  HCT 40.9 41.0 38.3  MCV 86.3  --  86.1  PLT 213  --  180   Basic Metabolic Panel: Recent Labs  Lab 11/23/23 0357 11/24/23 0528 11/25/23 0530  NA 142 140 139  K 3.1* 3.5 4.0  CL 105 103 105  CO2 24 27 27   GLUCOSE 116* 122* 111*  BUN 9 13 12   CREATININE 0.65 0.86 0.70  CALCIUM 9.2 8.8* 9.0  MG  --  1.5* 2.0   GFR: Estimated Creatinine Clearance: 38.4 mL/min (by C-G formula based on SCr of 0.7 mg/dL).   Scheduled Meds:  atorvastatin  10 mg Oral Daily   buPROPion  75 mg Oral Daily   ezetimibe  10 mg Oral Daily   levETIRAcetam  500 mg Oral BID   magnesium gluconate  500 mg Oral BID   montelukast  10 mg Oral q morning  LOS: 3 days   Lonia Blood, MD [facility] Office  [phone] Pager - Text Page per Amion  If 7PM-7AM, please contact night-coverage per Amion 11/25/2023, 10:01 AM

## 2023-11-26 DIAGNOSIS — I629 Nontraumatic intracranial hemorrhage, unspecified: Secondary | ICD-10-CM | POA: Diagnosis not present

## 2023-11-26 MED ORDER — ORAL CARE MOUTH RINSE: 15.0000 mL | OROMUCOSAL | Status: DC | PRN

## 2023-11-26 NOTE — Progress Notes (Signed)
Speech Language Pathology Treatment: Cognitive-Linquistic  Patient Details Name: Penny [name] MRN: [id number] DOB: September 29, 1933 Today's Date: 11/26/2023 Time: 6962-9528 SLP Time Calculation (min) (ACUTE ONLY): 12 min  Assessment / Plan / Recommendation Clinical Impression  Pt continues to present with language of confusion and expressive language deficits. She was accurate with a convergent naming task in 5/6 opportunities independently. Noted infrequent semantic and phonemic paraphasias at the level of spontaneous speech. She is oriented x3 and most prominent linguistic errors were observed in regards to pt describing the situation and current admission. This may be attributed to fatigue or emotion. Pt may continue to benefit from SLP f/u targeting cognitive linguistic goals. Will continue to follow as able.   HPI HPI: [name] is a 89 y.o. female with history of COPD, hypertension, hyperlipidemia, lung cancer, osteoporosis, seizures, shingles and vitamin D deficiency presenting with confusion, behavioral changes, urinary incontinence and expressive aphasia.  She did have a few recent falls.  She was found to have a large area of hemorrhage in the left anterior temporal lobe, likely subacute on head CT. ST consulted for speech/language assessment.      SLP Plan  Continue with current plan of care      Recommendations for follow up therapy are one component of a multi-disciplinary discharge planning process, led by the attending physician.  Recommendations may be updated based on patient status, additional functional criteria and insurance authorization.    Recommendations                     Oral care BID   Frequent or constant Supervision/Assistance Attention and concentration deficit;Cognitive communication deficit (R41.841);Aphasia (R47.01) Nontraumatic intracerebral hemorrhage   Continue with current plan of care     Gwynneth Aliment, M.A.,  CF-SLP Speech Language Pathology, Acute Rehabilitation Services  Secure Chat preferred [phone]   11/26/2023, 2:58 PM

## 2023-11-26 NOTE — Progress Notes (Signed)
Penny [name]  QMV:[id number] DOB: Nov 16, 1933 DOA: 11/22/2023 PCP: Lucky Cowboy, MD    Brief Narrative:  89 year old with a history of COPD, HTN, HLD, OA, RUL lung cancer status post lobectomy, right frontal cortex traumatic IPH June 2024, and a left temporal ICH of unclear etiology Sept 2024 who presented to the ER 12/19 with the acute onset of word finding difficulty over a 24-48-hour timeframe.  Goals of Care:   Code Status: Limited: Do not attempt resuscitation (DNR) -DNR-LIMITED -Do Not Intubate/DNI    DVT prophylaxis: SCD's Start: 11/22/23 1937  Interim Hx: No acute events recorded since the time of my last visit.  Vital signs stable.  Afebrile.  Stable at the time of my evaluation.  Assessment & Plan:  Left temporal lobe intracranial hemorrhage - cerebral amyloid angiopathy Care was directed by Neurology - clinical deficits include expressive aphasia increased incontinence and generalized confusion - presentation similar to that in September 2024 - suspected to be related to amyloid angiopathy - EEG without evidence of active seizure - was on aspirin prior to admission which has been discontinued  Vascular dementia Related to multiple CNS insults dating back to June 2024  Seizure disorder Continue usual seizure medication initiated during September 2024 admission  HTN Blood pressure controlled at present, with goal being SBP <160 -blood pressure presently well-controlled  HLD LDL 182 with goal of <70 - statin added - was previously on ezetimibe only  Modest hypokalemia Likely due to poor intake - corrected with supplementation and stable  Hypomagnesemia Likely due to poor intake - corrected with supplementation  COPD Stable at present  Recent T12 compression fracture Status post IR guided balloon kyphoplasty 10/10/2023  History of lung cancer status post partial lobectomy  Family Communication:  Disposition: Will require SNF - remains medically stable  for discharge when bed can be identified   Objective: Blood pressure 125/72, pulse 79, temperature 98 F (36.7 C), temperature source Oral, resp. rate 17, height 5\' 2"  (1.575 m), weight 53.6 kg, SpO2 97%.  Intake/Output Summary (Last 24 hours) at 11/26/2023 0903 Last data filed at 11/26/2023 0800 Gross per 24 hour  Intake 120 ml  Output --  Net 120 ml    Filed Weights   11/23/23 0600  Weight: 53.6 kg    Examination: General: No acute respiratory distress Lungs: Clear to auscultation bilaterally  Cardiovascular: RRR without murmur Abdomen: NT/ND, soft, BS positive, no rebound Extremities: No edema BLE  CBC: Recent Labs  Lab 11/22/23 1611 11/22/23 1636 11/23/23 0357  WBC 6.7  --  6.0  NEUTROABS 4.4  --   --   HGB 13.2 13.9 12.5  HCT 40.9 41.0 38.3  MCV 86.3  --  86.1  PLT 213  --  180   Basic Metabolic Panel: Recent Labs  Lab 11/23/23 0357 11/24/23 0528 11/25/23 0530  NA 142 140 139  K 3.1* 3.5 4.0  CL 105 103 105  CO2 24 27 27   GLUCOSE 116* 122* 111*  BUN 9 13 12   CREATININE 0.65 0.86 0.70  CALCIUM 9.2 8.8* 9.0  MG  --  1.5* 2.0   GFR: Estimated Creatinine Clearance: 38.4 mL/min (by C-G formula based on SCr of 0.7 mg/dL).   Scheduled Meds:  atorvastatin  10 mg Oral Daily   buPROPion  75 mg Oral Daily   ezetimibe  10 mg Oral Daily   levETIRAcetam  500 mg Oral BID   magnesium gluconate  500 mg Oral BID   montelukast  10  mg Oral q morning      LOS: 4 days   Lonia Blood, MD [facility] Office  [phone] Pager - Text Page per Amion  If 7PM-7AM, please contact night-coverage per Amion 11/26/2023, 9:03 AM

## 2023-11-26 NOTE — Plan of Care (Signed)

## 2023-11-26 NOTE — Care Management Important Message (Signed)
Important Message  Patient Details  Name: Penny [name] MRN: [id number] Date of Birth: June 08, 1933   Important Message Given:  Yes - Medicare IM     Dorena Bodo 11/26/2023, 2:44 PM

## 2023-11-26 NOTE — Plan of Care (Signed)
  Problem: Education: Goal: Knowledge of disease or condition will improve Outcome: Progressing Goal: Knowledge of secondary prevention will improve (MUST DOCUMENT ALL) Outcome: Progressing Goal: Knowledge of patient specific risk factors will improve Loraine Leriche N/A or DELETE if not current risk factor) Outcome: Progressing   Problem: Self-Care: Goal: Ability to participate in self-care as condition permits will improve Outcome: Progressing Goal: Verbalization of feelings and concerns over difficulty with self-care will improve Outcome: Progressing Goal: Ability to communicate needs accurately will improve Outcome: Progressing   Problem: Activity: Goal: Risk for activity intolerance will decrease Outcome: Progressing   Problem: Elimination: Goal: Will not experience complications related to bowel motility Outcome: Progressing Goal: Will not experience complications related to urinary retention Outcome: Progressing   Problem: Pain Management: Goal: General experience of comfort will improve Outcome: Progressing   Problem: Safety: Goal: Ability to remain free from injury will improve Outcome: Progressing   Problem: Skin Integrity: Goal: Risk for impaired skin integrity will decrease Outcome: Progressing

## 2023-11-26 NOTE — TOC Progression Note (Signed)
Transition of Care [facility]) - Progression Note    Patient Details  Name: Penny [name] MRN: [id number] Date of Birth: 10-Jun-1933  Transition [facility] Care [facility]) CM/SW Contact  Carley Hammed, LCSW Phone Number: 11/26/2023, 1:26 PM  Clinical Narrative:    CSW spoke with dtr Sandee and reviewed bed offers. Sandee would like for pt to return to [place], facility advised. Per medical team, pt is ready for DC. Authorization will need to be started when new PT note is available. TOC will continue to follow.    Expected Discharge Plan: Skilled Nursing Facility Barriers to Discharge: Insurance Authorization, SNF Pending bed offer, Continued Medical Work up  Expected Discharge Plan and Services In-house Referral: Clinical Social Work   Post Acute Care Choice: Skilled Nursing Facility Living arrangements for the past 2 months: Single Family Home                                       Social Determinants of Health (SDOH) Interventions SDOH Screenings   Food Insecurity: No Food Insecurity (11/23/2023)  Housing: Low Risk  (11/23/2023)  Transportation Needs: No Transportation Needs (11/23/2023)  Utilities: Not At Risk (11/23/2023)  Depression (PHQ2-9): Medium Risk (05/14/2023)  Tobacco Use: Medium Risk (11/22/2023)    Readmission Risk Interventions     No data to display

## 2023-11-26 NOTE — Progress Notes (Signed)
Physical Therapy Treatment Patient Details Name: Penny [name] MRN: [id number] DOB: [birth year] Today's Date: 11/26/2023   History of Present Illness Patient is an 89 yo female presenting to the ED with AMS and loss of bowel and bladder control on 11/22/23. CT showing new left temporal lobe hemorrhage measuring up to 3.2 x 2.3 cm.  There is mass effect on the left lateral ventricle. No significant midline shift. No hydrocephalus.   PMH includes: COPD, HTN, HLD, OA, remote RUL lung cancer s/p partial lobectomy, recent left temporal ICH of unclear etiology in September 2024    PT Comments  Patient doing much better overall. Verbalizing, following commands, and less physical assist for mobility. She required min assist for come to sit EOB (with HOB elevated and rail), min assist to stand from various surface heights (assist due to posterior lean), and mod assist for standing pre-gait activities with RW. No signs of pain and denied pain throughout. Did request seated rest x 2 due to fatigue.    If plan is discharge home, recommend the following: Supervision due to cognitive status;Assist for transportation;Assistance with cooking/housework;A lot of help with bathing/dressing/bathroom;A lot of help with walking and/or transfers   Can travel by private vehicle     No  Equipment Recommendations  None recommended by PT    Recommendations for Other Services       Precautions / Restrictions Precautions Precautions: Fall Precaution Comments: SBP <180 Restrictions Weight Bearing Restrictions Per Provider Order: No     Mobility  Bed Mobility Overal bed mobility: Needs Assistance Bed Mobility: Rolling, Sidelying to Sit Rolling: Contact guard assist, Used rails Sidelying to sit: Min assist, HOB elevated, Used rails       General bed mobility comments: assist to move legs over EOB and pt then able to raise her torso    Transfers Overall transfer level: Needs assistance Equipment  used: Rolling walker (2 wheels) Transfers: Bed to chair/wheelchair/BSC, Sit to/from Stand Sit to Stand: Min assist           General transfer comment: stood from EOB with min assist, from stedy seat min assist, from recliner to RW min assist Transfer via Lift Equipment: Stedy  Ambulation/Gait Ambulation/Gait assistance: Mod assist   Assistive device: Rolling walker (2 wheels)       Pre-gait activities: attempted stepping forward and backward with pt needing mod assist to weight shift off RLE and advance it; Mod assist again to wt-shift to step RLE back     Stairs             Wheelchair Mobility     Tilt Bed    Modified Rankin (Stroke Patients Only) Modified Rankin (Stroke Patients Only) Pre-Morbid Rankin Score: Moderately severe disability Modified Rankin: Severe disability     Balance Overall balance assessment: Needs assistance Sitting-balance support: Bilateral upper extremity supported, Feet supported Sitting balance-Leahy Scale: Fair     Standing balance support: Bilateral upper extremity supported, During functional activity, Reliant on assistive device for balance Standing balance-Leahy Scale: Poor Standing balance comment: slight posterior lean with each initial stand                            Cognition Arousal: Alert Behavior During Therapy: Flat affect Overall Cognitive Status: No family/caregiver present to determine baseline cognitive functioning Area of Impairment: Memory, Following commands, Safety/judgement, Awareness, Problem solving, Attention  Current Attention Level: Sustained Memory: Decreased recall of precautions, Decreased short-term memory Following Commands: Follows multi-step commands consistently, Follows one step commands with increased time Safety/Judgement: Decreased awareness of safety, Decreased awareness of deficits Awareness: Intellectual Problem Solving: Slow processing, Decreased  initiation, Difficulty sequencing, Requires verbal cues, Requires tactile cues General Comments: Able to state her name, not DOB; following simple commands and stating some short phrases "that's nice" "may I sit?"        Exercises      General Comments        Pertinent Vitals/Pain Pain Assessment Faces Pain Scale: No hurt    Home Living                          Prior Function            PT Goals (current goals can now be found in the care plan section) Acute Rehab PT Goals Patient Stated Goal: unable to state PT Goal Formulation: Patient unable to participate in goal setting Time For Goal Achievement: 12/07/23 Potential to Achieve Goals: Fair Progress towards PT goals: Progressing toward goals    Frequency    Min 1X/week      PT Plan      Co-evaluation              AM-PAC PT "6 Clicks" Mobility   Outcome Measure  Help needed turning from your back to your side while in a flat bed without using bedrails?: A Little Help needed moving from lying on your back to sitting on the side of a flat bed without using bedrails?: A Little Help needed moving to and from a bed to a chair (including a wheelchair)?: A Lot Help needed standing up from a chair using your arms (e.g., wheelchair or bedside chair)?: A Little Help needed to walk in hospital room?: Total Help needed climbing 3-5 steps with a railing? : Total 6 Click Score: 13    End of Session Equipment Utilized During Treatment: Gait belt Activity Tolerance: Patient limited by fatigue Patient left: in chair;with call bell/phone within reach;with chair alarm set Nurse Communication: Mobility status PT Visit Diagnosis: Other abnormalities of gait and mobility (R26.89);Muscle weakness (generalized) (M62.81)     Time: 9528-4132 PT Time Calculation (min) (ACUTE ONLY): 20 min  Charges:    $Gait Training: 8-22 mins PT General Charges $$ ACUTE PT VISIT: 1 Visit                      Jerolyn Center,  PT Acute Rehabilitation Services  Office [phone]    Zena Amos 11/26/2023, 1:26 PM

## 2023-11-27 DIAGNOSIS — I700 Atherosclerosis of aorta: Secondary | ICD-10-CM | POA: Diagnosis not present

## 2023-11-27 DIAGNOSIS — S42202D Unspecified fracture of upper end of left humerus, subsequent encounter for fracture with routine healing: Secondary | ICD-10-CM | POA: Diagnosis not present

## 2023-11-27 DIAGNOSIS — S0093XA Contusion of unspecified part of head, initial encounter: Secondary | ICD-10-CM | POA: Diagnosis not present

## 2023-11-27 DIAGNOSIS — I629 Nontraumatic intracranial hemorrhage, unspecified: Secondary | ICD-10-CM | POA: Diagnosis not present

## 2023-11-27 DIAGNOSIS — Z85118 Personal history of other malignant neoplasm of bronchus and lung: Secondary | ICD-10-CM | POA: Diagnosis not present

## 2023-11-27 DIAGNOSIS — M6259 Muscle wasting and atrophy, not elsewhere classified, multiple sites: Secondary | ICD-10-CM | POA: Diagnosis not present

## 2023-11-27 DIAGNOSIS — I2510 Atherosclerotic heart disease of native coronary artery without angina pectoris: Secondary | ICD-10-CM | POA: Diagnosis not present

## 2023-11-27 DIAGNOSIS — R531 Weakness: Secondary | ICD-10-CM | POA: Diagnosis not present

## 2023-11-27 DIAGNOSIS — I6782 Cerebral ischemia: Secondary | ICD-10-CM | POA: Diagnosis not present

## 2023-11-27 DIAGNOSIS — J439 Emphysema, unspecified: Secondary | ICD-10-CM | POA: Diagnosis not present

## 2023-11-27 DIAGNOSIS — R9089 Other abnormal findings on diagnostic imaging of central nervous system: Secondary | ICD-10-CM | POA: Diagnosis not present

## 2023-11-27 DIAGNOSIS — M79631 Pain in right forearm: Secondary | ICD-10-CM | POA: Diagnosis not present

## 2023-11-27 DIAGNOSIS — I69311 Memory deficit following cerebral infarction: Secondary | ICD-10-CM | POA: Diagnosis not present

## 2023-11-27 DIAGNOSIS — S60222A Contusion of left hand, initial encounter: Secondary | ICD-10-CM | POA: Diagnosis not present

## 2023-11-27 DIAGNOSIS — R4701 Aphasia: Secondary | ICD-10-CM | POA: Diagnosis not present

## 2023-11-27 DIAGNOSIS — I69351 Hemiplegia and hemiparesis following cerebral infarction affecting right dominant side: Secondary | ICD-10-CM | POA: Diagnosis not present

## 2023-11-27 DIAGNOSIS — R9431 Abnormal electrocardiogram [ECG] [EKG]: Secondary | ICD-10-CM | POA: Diagnosis not present

## 2023-11-27 DIAGNOSIS — R2681 Unsteadiness on feet: Secondary | ICD-10-CM | POA: Diagnosis not present

## 2023-11-27 DIAGNOSIS — S199XXA Unspecified injury of neck, initial encounter: Secondary | ICD-10-CM | POA: Diagnosis not present

## 2023-11-27 DIAGNOSIS — S4990XA Unspecified injury of shoulder and upper arm, unspecified arm, initial encounter: Secondary | ICD-10-CM | POA: Diagnosis not present

## 2023-11-27 DIAGNOSIS — M79602 Pain in left arm: Secondary | ICD-10-CM | POA: Diagnosis not present

## 2023-11-27 DIAGNOSIS — J449 Chronic obstructive pulmonary disease, unspecified: Secondary | ICD-10-CM | POA: Diagnosis not present

## 2023-11-27 DIAGNOSIS — R2689 Other abnormalities of gait and mobility: Secondary | ICD-10-CM | POA: Diagnosis not present

## 2023-11-27 DIAGNOSIS — E785 Hyperlipidemia, unspecified: Secondary | ICD-10-CM | POA: Diagnosis not present

## 2023-11-27 DIAGNOSIS — M6281 Muscle weakness (generalized): Secondary | ICD-10-CM | POA: Diagnosis not present

## 2023-11-27 DIAGNOSIS — Z743 Need for continuous supervision: Secondary | ICD-10-CM | POA: Diagnosis not present

## 2023-11-27 DIAGNOSIS — J90 Pleural effusion, not elsewhere classified: Secondary | ICD-10-CM | POA: Diagnosis not present

## 2023-11-27 DIAGNOSIS — M25511 Pain in right shoulder: Secondary | ICD-10-CM | POA: Diagnosis not present

## 2023-11-27 DIAGNOSIS — F0390 Unspecified dementia without behavioral disturbance: Secondary | ICD-10-CM | POA: Diagnosis not present

## 2023-11-27 DIAGNOSIS — I619 Nontraumatic intracerebral hemorrhage, unspecified: Secondary | ICD-10-CM | POA: Diagnosis not present

## 2023-11-27 DIAGNOSIS — S42342A Displaced spiral fracture of shaft of humerus, left arm, initial encounter for closed fracture: Secondary | ICD-10-CM | POA: Diagnosis not present

## 2023-11-27 DIAGNOSIS — E559 Vitamin D deficiency, unspecified: Secondary | ICD-10-CM | POA: Diagnosis not present

## 2023-11-27 DIAGNOSIS — I10 Essential (primary) hypertension: Secondary | ICD-10-CM | POA: Diagnosis not present

## 2023-11-27 DIAGNOSIS — M546 Pain in thoracic spine: Secondary | ICD-10-CM | POA: Diagnosis not present

## 2023-11-27 DIAGNOSIS — R569 Unspecified convulsions: Secondary | ICD-10-CM | POA: Diagnosis not present

## 2023-11-27 DIAGNOSIS — R9082 White matter disease, unspecified: Secondary | ICD-10-CM | POA: Diagnosis not present

## 2023-11-27 DIAGNOSIS — I680 Cerebral amyloid angiopathy: Secondary | ICD-10-CM | POA: Diagnosis not present

## 2023-11-27 DIAGNOSIS — I69128 Other speech and language deficits following nontraumatic intracerebral hemorrhage: Secondary | ICD-10-CM | POA: Diagnosis not present

## 2023-11-27 DIAGNOSIS — G319 Degenerative disease of nervous system, unspecified: Secondary | ICD-10-CM | POA: Diagnosis not present

## 2023-11-27 DIAGNOSIS — S0083XA Contusion of other part of head, initial encounter: Secondary | ICD-10-CM | POA: Diagnosis not present

## 2023-11-27 DIAGNOSIS — R079 Chest pain, unspecified: Secondary | ICD-10-CM | POA: Diagnosis not present

## 2023-11-27 DIAGNOSIS — S0990XA Unspecified injury of head, initial encounter: Secondary | ICD-10-CM | POA: Diagnosis not present

## 2023-11-27 DIAGNOSIS — M545 Low back pain, unspecified: Secondary | ICD-10-CM | POA: Diagnosis not present

## 2023-11-27 DIAGNOSIS — I611 Nontraumatic intracerebral hemorrhage in hemisphere, cortical: Secondary | ICD-10-CM | POA: Diagnosis not present

## 2023-11-27 DIAGNOSIS — Z741 Need for assistance with personal care: Secondary | ICD-10-CM | POA: Diagnosis not present

## 2023-11-27 DIAGNOSIS — R262 Difficulty in walking, not elsewhere classified: Secondary | ICD-10-CM | POA: Diagnosis not present

## 2023-11-27 DIAGNOSIS — U071 COVID-19: Secondary | ICD-10-CM | POA: Diagnosis not present

## 2023-11-27 DIAGNOSIS — W19XXXA Unspecified fall, initial encounter: Secondary | ICD-10-CM | POA: Diagnosis not present

## 2023-11-27 DIAGNOSIS — Z7401 Bed confinement status: Secondary | ICD-10-CM | POA: Diagnosis not present

## 2023-11-27 DIAGNOSIS — S06320A Contusion and laceration of left cerebrum without loss of consciousness, initial encounter: Secondary | ICD-10-CM | POA: Diagnosis not present

## 2023-11-27 DIAGNOSIS — M810 Age-related osteoporosis without current pathological fracture: Secondary | ICD-10-CM | POA: Diagnosis not present

## 2023-11-27 MED ORDER — MAGNESIUM GLUCONATE 500 MG PO TABS: 500.0000 mg | ORAL_TABLET | Freq: Every day | ORAL | Status: DC

## 2023-11-27 MED ORDER — ATORVASTATIN CALCIUM 10 MG PO TABS: 10.0000 mg | ORAL_TABLET | Freq: Every day | ORAL | Status: DC

## 2023-11-27 NOTE — Plan of Care (Signed)
Will continue to monitor progress

## 2023-11-27 NOTE — TOC Transition Note (Signed)
Transition of Care [facility]) - Discharge Note   Patient Details  Name: Lorea Brester MRN: [id number] Date of Birth: January 31, 1933  [facility] of [facility]) CM/SW Contact:  Carley Hammed, LCSW Phone Number: 11/27/2023, 9:19 AM   Clinical Narrative:    Pt to be transported to [facility] via Dtr. Nurse to call report to [phone].   Final next level of care: Skilled Nursing Facility Barriers to Discharge: Barriers Resolved   Patient Goals and CMS Choice Patient states their goals for this hospitalization and ongoing recovery are:: Pt is disoriented and unable to participate in goal setting. CMS Medicare.gov Compare Post Acute Care list provided to:: Patient Represenative (must comment) Choice offered to / list presented to : Adult Children      Discharge Placement              Patient [facility] Patient to be transferred to facility by: Dava Najjar (dtr) Name of family member notified: Sandee Dtr Patient and family notified of of transfer: 11/27/23  Discharge Plan and Services Additional resources added to the After Visit Summary for   In-house Referral: Clinical Social Work   Post Acute Care Choice: Skilled Nursing Facility                               Social Drivers of Health (SDOH) Interventions SDOH Screenings   Food Insecurity: No Food Insecurity (11/23/2023)  Housing: Low Risk  (11/23/2023)  Transportation Needs: No Transportation Needs (11/23/2023)  Utilities: Not At Risk (11/23/2023)  Depression (PHQ2-9): Medium Risk (05/14/2023)  Tobacco Use: Medium Risk (11/22/2023)     Readmission Risk Interventions     No data to display

## 2023-11-27 NOTE — Discharge Summary (Signed)
DISCHARGE SUMMARY  Penny [name]  MR#: [id number]  DOB:10-Dec-1932  Date of Admission: 11/22/2023 Date of Discharge: 11/27/2023  Attending Physician:Jamario Colina Silvestre Gunner, MD  Patient's NWG:[id number], Chrissie Noa, MD  Disposition: Discharge to SNF for rehab  Follow-up Appts:  Contact information for after-discharge care     Destination     [facility] Preferred SNF .   Service: Skilled Nursing Contact information: [address] [place] [address] [phone]                     Discharge Diagnoses: Left temporal lobe intracranial hemorrhage - cerebral amyloid angiopathy Vascular dementia Seizure disorder HTN HLD Modest hypokalemia Hypomagnesemia COPD Recent T12 compression fracture History of lung cancer status post partial lobectomy  Initial presentation: 89 year old with a history of COPD, HTN, HLD, OA, RUL lung cancer status post lobectomy, right frontal cortex traumatic IPH June 2024, and a left temporal ICH of unclear etiology Sept 2024 who presented to the ER 12/19 with the acute onset of word finding difficulty over a 24-48-hour timeframe.   Hospital Course:  Left temporal lobe intracranial hemorrhage - cerebral amyloid angiopathy Care was directed by Neurology - clinical deficits include expressive aphasia, increased incontinence, and generalized confusion - presentation similar to that in September 2024 - suspected to be related to amyloid angiopathy - EEG without evidence of active seizure - was on aspirin prior to admission which has been discontinued   Vascular dementia Related to multiple CNS insults dating back to June 2024   Seizure disorder Continue usual seizure medication initiated during September 2024 admission   HTN Blood pressure controlled at present, with goal being SBP <160    HLD LDL 182 with goal of <70 - statin added during this admission - was previously on ezetimibe only    Modest hypokalemia Likely due to poor intake - corrected with supplementation and stable   Hypomagnesemia Likely due to poor intake - corrected with supplementation   COPD Stable during this admission   Recent T12 compression fracture Status post IR guided balloon kyphoplasty 10/10/2023 -no evidence of ongoing pain   History of lung cancer status post partial lobectomy  Allergies as of 11/27/2023       Reactions   Ace Inhibitors Cough   Calcium-containing Compounds Other (See Comments)   constipation   Polysporin [bacitracin-polymyxin B] Rash   Polysporin or neosporin gave her a rash, couldn't remember which        Medication List     STOP taking these medications    aspirin 81 MG chewable tablet       TAKE these medications    acetaminophen 325 MG tablet Commonly known as: TYLENOL Take 650 mg by mouth as needed for mild pain or moderate pain.   atorvastatin 10 MG tablet Commonly known as: LIPITOR Take 1 tablet (10 mg total) by mouth daily. Start taking on: November 28, 2023   buPROPion 75 MG tablet Commonly known as: WELLBUTRIN TAKE 1 TABLET BY MOUTH DAILY   Cholecalciferol 125 MCG (5000 UT) capsule Take 1 capsules Daily What changed:  how much to take how to take this when to take this additional instructions   ezetimibe 10 MG tablet Commonly known as: ZETIA Take 1 tablet (10 mg total) by mouth daily.   levETIRAcetam 500 MG tablet Commonly known as: Keppra Take 1 tablet (500 mg total) by mouth 2 (two) times daily.   magnesium gluconate 500 MG tablet Commonly known as: MAGONATE  Take 1 tablet (500 mg total) by mouth daily.   montelukast 10 MG tablet Commonly known as: SINGULAIR Take  1 tablet  Daily for Allergies                                                            /                            TAKE                      BY                     MOUTH What changed:  how much to take how to take this when to take this additional  instructions   Vitamin B-12 1000 MCG Subl Takes 1 tab SL Daily What changed:  how much to take how to take this when to take this additional instructions        Day of Discharge BP 129/67 (BP Location: Left Arm)   Pulse 79   Temp 97.7 F (36.5 C) (Oral)   Resp 17   Ht 5\' 2"  (1.575 m)   Wt 53.6 kg   SpO2 94%   BMI 21.61 kg/m   Physical Exam: General: No acute respiratory distress Lungs: Clear to auscultation bilaterally without wheezes or crackles Cardiovascular: Regular rate and rhythm without murmur  Abdomen: Nontender, nondistended, soft, bowel sounds positive, no rebound Extremities: No significant cyanosis, clubbing, or edema bilateral lower extremities  Basic Metabolic Panel: Recent Labs  Lab 11/22/23 1611 11/22/23 1636 11/23/23 0357 11/24/23 0528 11/25/23 0530  NA 141 141 142 140 139  K 3.5 3.4* 3.1* 3.5 4.0  CL 104 102 105 103 105  CO2 26  --  24 27 27   GLUCOSE 118* 117* 116* 122* 111*  BUN 11 15 9 13 12   CREATININE 0.77 0.70 0.65 0.86 0.70  CALCIUM 9.5  --  9.2 8.8* 9.0  MG  --   --   --  1.5* 2.0    CBC: Recent Labs  Lab 11/22/23 1611 11/22/23 1636 11/23/23 0357  WBC 6.7  --  6.0  NEUTROABS 4.4  --   --   HGB 13.2 13.9 12.5  HCT 40.9 41.0 38.3  MCV 86.3  --  86.1  PLT 213  --  180    Time spent in discharge (includes decision making & examination of pt): 35 minutes  11/27/2023, 9:11 AM   Lonia Blood, MD [facility] Office  [phone]

## 2023-11-29 DIAGNOSIS — I10 Essential (primary) hypertension: Secondary | ICD-10-CM | POA: Diagnosis not present

## 2023-11-29 DIAGNOSIS — I680 Cerebral amyloid angiopathy: Secondary | ICD-10-CM | POA: Diagnosis not present

## 2023-11-29 DIAGNOSIS — E785 Hyperlipidemia, unspecified: Secondary | ICD-10-CM | POA: Diagnosis not present

## 2023-11-29 DIAGNOSIS — E559 Vitamin D deficiency, unspecified: Secondary | ICD-10-CM | POA: Diagnosis not present

## 2023-11-29 DIAGNOSIS — R569 Unspecified convulsions: Secondary | ICD-10-CM | POA: Diagnosis not present

## 2023-11-29 DIAGNOSIS — I619 Nontraumatic intracerebral hemorrhage, unspecified: Secondary | ICD-10-CM | POA: Diagnosis not present

## 2023-11-29 DIAGNOSIS — I611 Nontraumatic intracerebral hemorrhage in hemisphere, cortical: Secondary | ICD-10-CM | POA: Diagnosis not present

## 2023-11-30 DIAGNOSIS — I700 Atherosclerosis of aorta: Secondary | ICD-10-CM | POA: Diagnosis not present

## 2023-11-30 DIAGNOSIS — I2510 Atherosclerotic heart disease of native coronary artery without angina pectoris: Secondary | ICD-10-CM | POA: Diagnosis not present

## 2023-11-30 DIAGNOSIS — W19XXXA Unspecified fall, initial encounter: Secondary | ICD-10-CM | POA: Diagnosis not present

## 2023-12-04 DIAGNOSIS — R569 Unspecified convulsions: Secondary | ICD-10-CM | POA: Diagnosis not present

## 2023-12-04 DIAGNOSIS — I611 Nontraumatic intracerebral hemorrhage in hemisphere, cortical: Secondary | ICD-10-CM | POA: Diagnosis not present

## 2023-12-04 DIAGNOSIS — E559 Vitamin D deficiency, unspecified: Secondary | ICD-10-CM | POA: Diagnosis not present

## 2023-12-04 DIAGNOSIS — W19XXXA Unspecified fall, initial encounter: Secondary | ICD-10-CM | POA: Diagnosis not present

## 2023-12-05 DIAGNOSIS — J449 Chronic obstructive pulmonary disease, unspecified: Secondary | ICD-10-CM | POA: Diagnosis not present

## 2023-12-05 DIAGNOSIS — I619 Nontraumatic intracerebral hemorrhage, unspecified: Secondary | ICD-10-CM | POA: Diagnosis not present

## 2023-12-05 DIAGNOSIS — I69351 Hemiplegia and hemiparesis following cerebral infarction affecting right dominant side: Secondary | ICD-10-CM | POA: Diagnosis not present

## 2023-12-05 DIAGNOSIS — M6281 Muscle weakness (generalized): Secondary | ICD-10-CM | POA: Diagnosis not present

## 2023-12-05 DIAGNOSIS — R2689 Other abnormalities of gait and mobility: Secondary | ICD-10-CM | POA: Diagnosis not present

## 2023-12-06 DIAGNOSIS — R4701 Aphasia: Secondary | ICD-10-CM | POA: Diagnosis not present

## 2023-12-06 DIAGNOSIS — I69311 Memory deficit following cerebral infarction: Secondary | ICD-10-CM | POA: Diagnosis not present

## 2023-12-06 DIAGNOSIS — W19XXXA Unspecified fall, initial encounter: Secondary | ICD-10-CM | POA: Diagnosis not present

## 2023-12-08 ENCOUNTER — Emergency Department (HOSPITAL_COMMUNITY): Payer: Medicare Other

## 2023-12-08 ENCOUNTER — Emergency Department (HOSPITAL_COMMUNITY)
Admission: EM | Admit: 2023-12-08 | Discharge: 2023-12-08 | Disposition: A | Discharge status: Home / Self Care | Payer: Medicare Other | Attending: Emergency Medicine | Admitting: Emergency Medicine

## 2023-12-08 ENCOUNTER — Other Ambulatory Visit: Payer: Self-pay

## 2023-12-08 ENCOUNTER — Encounter (HOSPITAL_COMMUNITY): Payer: Self-pay

## 2023-12-08 DIAGNOSIS — I10 Essential (primary) hypertension: Secondary | ICD-10-CM | POA: Diagnosis not present

## 2023-12-08 DIAGNOSIS — Z7401 Bed confinement status: Secondary | ICD-10-CM | POA: Diagnosis not present

## 2023-12-08 DIAGNOSIS — R9089 Other abnormal findings on diagnostic imaging of central nervous system: Secondary | ICD-10-CM | POA: Diagnosis not present

## 2023-12-08 DIAGNOSIS — J439 Emphysema, unspecified: Secondary | ICD-10-CM | POA: Diagnosis not present

## 2023-12-08 DIAGNOSIS — S4990XA Unspecified injury of shoulder and upper arm, unspecified arm, initial encounter: Secondary | ICD-10-CM | POA: Diagnosis not present

## 2023-12-08 DIAGNOSIS — S42342A Displaced spiral fracture of shaft of humerus, left arm, initial encounter for closed fracture: Secondary | ICD-10-CM | POA: Diagnosis not present

## 2023-12-08 DIAGNOSIS — R531 Weakness: Secondary | ICD-10-CM | POA: Diagnosis not present

## 2023-12-08 DIAGNOSIS — M79602 Pain in left arm: Secondary | ICD-10-CM | POA: Diagnosis not present

## 2023-12-08 DIAGNOSIS — S60222A Contusion of left hand, initial encounter: Secondary | ICD-10-CM | POA: Diagnosis not present

## 2023-12-08 DIAGNOSIS — S06320A Contusion and laceration of left cerebrum without loss of consciousness, initial encounter: Secondary | ICD-10-CM | POA: Diagnosis not present

## 2023-12-08 DIAGNOSIS — R9082 White matter disease, unspecified: Secondary | ICD-10-CM | POA: Diagnosis not present

## 2023-12-08 DIAGNOSIS — S0083XA Contusion of other part of head, initial encounter: Secondary | ICD-10-CM | POA: Diagnosis not present

## 2023-12-08 DIAGNOSIS — W19XXXA Unspecified fall, initial encounter: Secondary | ICD-10-CM | POA: Diagnosis not present

## 2023-12-08 DIAGNOSIS — S0990XA Unspecified injury of head, initial encounter: Secondary | ICD-10-CM | POA: Diagnosis present

## 2023-12-08 DIAGNOSIS — Z743 Need for continuous supervision: Secondary | ICD-10-CM | POA: Diagnosis not present

## 2023-12-08 LAB — BASIC METABOLIC PANEL
Anion gap: 8 (ref 5–15)
BUN: 11 mg/dL (ref 8–23)
CO2: 29 mmol/L (ref 22–32)
Calcium: 9.2 mg/dL (ref 8.9–10.3)
Chloride: 105 mmol/L (ref 98–111)
Creatinine, Ser: 0.77 mg/dL (ref 0.44–1.00)
GFR, Estimated: >60 mL/min (ref >60)
Glucose, Bld: 115 mg/dL — ABNORMAL HIGH (ref 70–99)
Potassium: 3.7 mmol/L (ref 3.5–5.1)
Sodium: 142 mmol/L (ref 135–145)

## 2023-12-08 LAB — CBC
HCT: 39.8 % (ref 36.0–46.0)
Hemoglobin: 12.6 g/dL (ref 12.0–15.0)
MCH: 28.1 pg (ref 26.0–34.0)
MCHC: 31.7 g/dL (ref 30.0–36.0)
MCV: 88.6 fL (ref 80.0–100.0)
Platelets: 199 10*3/uL (ref 150–400)
RBC: 4.49 MIL/uL (ref 3.87–5.11)
RDW: 13.7 % (ref 11.5–15.5)
WBC: 6.1 10*3/uL (ref 4.0–10.5)
nRBC: 0 % (ref 0.0–0.2)

## 2023-12-08 NOTE — ED Provider Notes (Signed)
 [place] [facility] AT [facility] Provider Note   CSN: [id number] Arrival date & time: 12/08/23  9261     History  Chief Complaint  Patient presents with   Felton Levan Trezure Cronk is a 89 y.o. female.   Fall  Patient presents after fall.  Questionable fall yesterday.  Reported left humerus fracture on outside x-rays of the no I do not have access to this.  Does have bruising to head.  Some bruising on left hand.  History of recent left humerus fractures that have been healing.  Previous intracranial hemorrhage also.     Home Medications Prior to Admission medications   Medication Sig Start Date End Date Taking? Authorizing Provider  acetaminophen  (TYLENOL ) 325 MG tablet Take 650 mg by mouth as needed for mild pain or moderate pain.    [provider]  atorvastatin  (LIPITOR) 10 MG tablet Take 1 tablet (10 mg total) by mouth daily. 11/28/23   Danton Reyes DASEN, MD  buPROPion  (WELLBUTRIN ) 75 MG tablet TAKE 1 TABLET BY MOUTH DAILY 08/13/23   Wilkinson, Dana E, NP  Cholecalciferol  125 MCG (5000 UT) capsule Take 1 capsules Daily Patient taking differently: Take 5,000 Units by mouth daily. 12/01/21   Jeanine Knee, NP  Cyanocobalamin  (VITAMIN B-12) 1000 MCG SUBL Takes 1 tab SL Daily Patient taking differently: Take 1 tablet by mouth daily. 05/02/19   Tonita Fallow, MD  ezetimibe  (ZETIA ) 10 MG tablet Take 1 tablet (10 mg total) by mouth daily. 08/27/23   Wilkinson, Dana E, NP  levETIRAcetam  (KEPPRA ) 500 MG tablet Take 1 tablet (500 mg total) by mouth 2 (two) times daily. 09/10/23   Tonita Fallow, MD  magnesium  gluconate (MAGONATE) 500 MG tablet Take 1 tablet (500 mg total) by mouth daily. 11/27/23   Danton Reyes DASEN, MD  montelukast  (SINGULAIR ) 10 MG tablet Take  1 tablet  Daily for Allergies                                                            /                            TAKE                      BY                     MOUTH Patient  taking differently: Take 10 mg by mouth every morning. 05/08/23   Tonita Fallow, MD      Allergies    Ace inhibitors, Calcium -containing compounds, and Polysporin [bacitracin-polymyxin b]    Review of Systems   Review of Systems  Physical Exam Updated Vital Signs BP (!) 144/89   Pulse 82   Temp 98.6 F (37 C)   Resp 18   Ht 5' 2 (1.575 m)   Wt 53 kg   SpO2 94%   BMI 21.37 kg/m  Physical Exam Vitals and nursing note reviewed.  HENT:     Head:     Comments: Ecchymosis to left temple. Eyes:     Pupils: Pupils are equal, round, and reactive to light.  Cardiovascular:     Rate and Rhythm: Regular rhythm.  Abdominal:  Tenderness: There is no abdominal tenderness.  Musculoskeletal:     Cervical back: Neck supple.     Comments: Chronic falls of the left humerus.  Denies tenderness.  No new deformity.  Mild ecchymosis on left hand without underlying tenderness.  Skin:    General: Skin is warm.  Neurological:     Mental Status: She is alert. Mental status is at baseline.     ED Results / Procedures / Treatments   Labs (all labs ordered are listed, but only abnormal results are displayed) Labs Reviewed  BASIC METABOLIC PANEL  CBC  URINALYSIS, W/ REFLEX TO CULTURE (INFECTION SUSPECTED)    EKG None  Radiology CT HEAD WO CONTRAST ( ) Result Date: 12/08/2023 CLINICAL DATA:  Fall yesterday EXAM: CT HEAD WITHOUT CONTRAST CT CERVICAL SPINE WITHOUT CONTRAST TECHNIQUE: Multidetector CT imaging of the head and cervical spine was performed following the standard protocol without intravenous contrast. Multiplanar CT image reconstructions of the cervical spine were also generated. RADIATION DOSE REDUCTION: This exam was performed according to the departmental dose-optimization program which includes automated exposure control, adjustment of the mA and/or kV according to patient size and/or use of iterative reconstruction technique. COMPARISON:  11/22/2023 FINDINGS: CT HEAD  FINDINGS Brain: A left temporal hematoma seen on prior imaging has become hazily marginated smaller, and less dense. No evidence of progression or new bleed. Advanced chronic white matter disease with confluent low-density. Cerebral volume loss that is generalized. Vascular: No hyperdense vessel or unexpected calcification. Skull: No acute finding Sinuses/Orbits: No evidence of injury CT CERVICAL SPINE FINDINGS Alignment: 50 no traumatic malalignment. Skull base and vertebrae: No acute fracture. No primary bone lesion or focal pathologic process. Soft tissues and spinal canal: No prevertebral fluid or swelling. No visible canal hematoma. Disc levels: Generalized degenerative disc narrowing with endplate and facet spurring. Upper chest: No acute finding.  Emphysema IMPRESSION: 1. No evidence of acute intracranial or cervical spine injury. 2. Expected evolution of left temporal hematoma when compared to 11/22/2023. Electronically Signed   By: Dorn Roulette M.D.   On: 12/08/2023 09:00   CT Cervical Spine Wo Contrast Result Date: 12/08/2023 CLINICAL DATA:  Fall yesterday EXAM: CT HEAD WITHOUT CONTRAST CT CERVICAL SPINE WITHOUT CONTRAST TECHNIQUE: Multidetector CT imaging of the head and cervical spine was performed following the standard protocol without intravenous contrast. Multiplanar CT image reconstructions of the cervical spine were also generated. RADIATION DOSE REDUCTION: This exam was performed according to the departmental dose-optimization program which includes automated exposure control, adjustment of the mA and/or kV according to patient size and/or use of iterative reconstruction technique. COMPARISON:  11/22/2023 FINDINGS: CT HEAD FINDINGS Brain: A left temporal hematoma seen on prior imaging has become hazily marginated smaller, and less dense. No evidence of progression or new bleed. Advanced chronic white matter disease with confluent low-density. Cerebral volume loss that is generalized.  Vascular: No hyperdense vessel or unexpected calcification. Skull: No acute finding Sinuses/Orbits: No evidence of injury CT CERVICAL SPINE FINDINGS Alignment: 50 no traumatic malalignment. Skull base and vertebrae: No acute fracture. No primary bone lesion or focal pathologic process. Soft tissues and spinal canal: No prevertebral fluid or swelling. No visible canal hematoma. Disc levels: Generalized degenerative disc narrowing with endplate and facet spurring. Upper chest: No acute finding.  Emphysema IMPRESSION: 1. No evidence of acute intracranial or cervical spine injury. 2. Expected evolution of left temporal hematoma when compared to 11/22/2023. Electronically Signed   By: Dorn Roulette M.D.   On: 12/08/2023 09:00  DG Humerus Left Result Date: 12/08/2023 CLINICAL DATA:  89 year old female with history of trauma from a fall yesterday. Left arm pain. EXAM: LEFT HUMERUS - 2+ VIEW COMPARISON:  Left shoulder radiograph 06/01/2023. FINDINGS: Extensive posttraumatic remodeling of the left humeral diaphysis with abundant callus formation around the previously demonstrated spiral fracture of the proximal third of the humeral diaphysis, as well as additional callus formation overlying the distal third of the humeral diaphysis. No definite new acute displaced fracture confidently identified. IMPRESSION: 1. Extensive posttraumatic deformity of the left humeral diaphysis related to remote fracture. No definite acute findings identified. Electronically Signed   By: Toribio Aye M.D.   On: 12/08/2023 08:59    Procedures Procedures    Medications Ordered in ED Medications - No data to display  ED Course/ Medical Decision Making/ A&P                                 Medical Decision Making Amount and/or Complexity of Data Reviewed Labs: ordered. Radiology: ordered.   Patient with likely fall.  At reported demented baseline.  Reviewed previous Ortho notes.  Does have no tenderness on left humerus  but reported positive x-ray.  X-ray here and depending interpreted shows no fracture but shows large amount remodeling.  Head CT also reassuring.  Lab work reassuring.  Discharge home.          Final Clinical Impression(s) / ED Diagnoses Final diagnoses:  None    Rx / DC Orders ED Discharge Orders     None         Patsey Lot, MD 12/08/23 1458

## 2023-12-08 NOTE — ED Notes (Signed)
 [facility] called

## 2023-12-08 NOTE — ED Notes (Signed)
 Report called to Otho Perl LPN

## 2023-12-08 NOTE — ED Triage Notes (Signed)
 Pt coming in from [place] after a fall yesterday. Pt had an xray of her left humerus and it was found to be fractured. Pt  has bruising on left upper arm and pt has bruising on left temple and left hand.   Ems vitals  Bp 160-/90 Hr 80 96% ra

## 2023-12-08 NOTE — ED Notes (Signed)
 Pt is alert and mentation at baseline per ems

## 2023-12-08 NOTE — Discharge Instructions (Signed)
 Your workup was reassuring.  No clear injury seen.  Follow-up with your doctors as needed.

## 2023-12-12 DIAGNOSIS — I69351 Hemiplegia and hemiparesis following cerebral infarction affecting right dominant side: Secondary | ICD-10-CM | POA: Diagnosis not present

## 2023-12-12 DIAGNOSIS — R2689 Other abnormalities of gait and mobility: Secondary | ICD-10-CM | POA: Diagnosis not present

## 2023-12-12 DIAGNOSIS — I619 Nontraumatic intracerebral hemorrhage, unspecified: Secondary | ICD-10-CM | POA: Diagnosis not present

## 2023-12-12 DIAGNOSIS — J449 Chronic obstructive pulmonary disease, unspecified: Secondary | ICD-10-CM | POA: Diagnosis not present

## 2023-12-12 DIAGNOSIS — M6281 Muscle weakness (generalized): Secondary | ICD-10-CM | POA: Diagnosis not present

## 2023-12-14 DIAGNOSIS — M25511 Pain in right shoulder: Secondary | ICD-10-CM | POA: Diagnosis not present

## 2023-12-14 DIAGNOSIS — M79631 Pain in right forearm: Secondary | ICD-10-CM | POA: Diagnosis not present

## 2023-12-19 DIAGNOSIS — I69351 Hemiplegia and hemiparesis following cerebral infarction affecting right dominant side: Secondary | ICD-10-CM | POA: Diagnosis not present

## 2023-12-19 DIAGNOSIS — I619 Nontraumatic intracerebral hemorrhage, unspecified: Secondary | ICD-10-CM | POA: Diagnosis not present

## 2023-12-19 DIAGNOSIS — J449 Chronic obstructive pulmonary disease, unspecified: Secondary | ICD-10-CM | POA: Diagnosis not present

## 2023-12-19 DIAGNOSIS — M6281 Muscle weakness (generalized): Secondary | ICD-10-CM | POA: Diagnosis not present

## 2023-12-19 DIAGNOSIS — R2689 Other abnormalities of gait and mobility: Secondary | ICD-10-CM | POA: Diagnosis not present

## 2023-12-28 ENCOUNTER — Other Ambulatory Visit: Payer: Self-pay

## 2023-12-28 ENCOUNTER — Emergency Department (HOSPITAL_COMMUNITY): Payer: Medicare Other

## 2023-12-28 ENCOUNTER — Emergency Department (HOSPITAL_COMMUNITY)
Admission: EM | Admit: 2023-12-28 | Discharge: 2023-12-28 | Disposition: A | Discharge status: Skilled Nursing Facility | Payer: Medicare Other | Attending: Emergency Medicine | Admitting: Emergency Medicine

## 2023-12-28 ENCOUNTER — Other Ambulatory Visit: Payer: Self-pay | Admitting: Nurse Practitioner

## 2023-12-28 ENCOUNTER — Encounter (HOSPITAL_COMMUNITY): Payer: Self-pay

## 2023-12-28 DIAGNOSIS — J449 Chronic obstructive pulmonary disease, unspecified: Secondary | ICD-10-CM | POA: Insufficient documentation

## 2023-12-28 DIAGNOSIS — M545 Low back pain, unspecified: Secondary | ICD-10-CM | POA: Diagnosis not present

## 2023-12-28 DIAGNOSIS — S0093XA Contusion of unspecified part of head, initial encounter: Secondary | ICD-10-CM | POA: Diagnosis not present

## 2023-12-28 DIAGNOSIS — W19XXXA Unspecified fall, initial encounter: Secondary | ICD-10-CM | POA: Insufficient documentation

## 2023-12-28 DIAGNOSIS — I10 Essential (primary) hypertension: Secondary | ICD-10-CM | POA: Insufficient documentation

## 2023-12-28 DIAGNOSIS — Z85118 Personal history of other malignant neoplasm of bronchus and lung: Secondary | ICD-10-CM | POA: Diagnosis not present

## 2023-12-28 DIAGNOSIS — R9431 Abnormal electrocardiogram [ECG] [EKG]: Secondary | ICD-10-CM | POA: Diagnosis not present

## 2023-12-28 DIAGNOSIS — Z79899 Other long term (current) drug therapy: Secondary | ICD-10-CM

## 2023-12-28 DIAGNOSIS — G319 Degenerative disease of nervous system, unspecified: Secondary | ICD-10-CM | POA: Diagnosis not present

## 2023-12-28 DIAGNOSIS — S0990XA Unspecified injury of head, initial encounter: Secondary | ICD-10-CM | POA: Diagnosis not present

## 2023-12-28 DIAGNOSIS — I6782 Cerebral ischemia: Secondary | ICD-10-CM | POA: Diagnosis not present

## 2023-12-28 DIAGNOSIS — J90 Pleural effusion, not elsewhere classified: Secondary | ICD-10-CM | POA: Diagnosis not present

## 2023-12-28 DIAGNOSIS — R079 Chest pain, unspecified: Secondary | ICD-10-CM | POA: Diagnosis not present

## 2023-12-28 DIAGNOSIS — F0390 Unspecified dementia without behavioral disturbance: Secondary | ICD-10-CM | POA: Insufficient documentation

## 2023-12-28 DIAGNOSIS — M546 Pain in thoracic spine: Secondary | ICD-10-CM | POA: Diagnosis not present

## 2023-12-28 DIAGNOSIS — S199XXA Unspecified injury of neck, initial encounter: Secondary | ICD-10-CM | POA: Diagnosis not present

## 2023-12-28 DIAGNOSIS — F321 Major depressive disorder, single episode, moderate: Secondary | ICD-10-CM

## 2023-12-28 LAB — CBC WITH DIFFERENTIAL/PLATELET
Abs Immature Granulocytes: 0.02 10*3/uL (ref 0.00–0.07)
Basophils Absolute: 0 10*3/uL (ref 0.0–0.1)
Basophils Relative: 0 %
Eosinophils Absolute: 0.1 10*3/uL (ref 0.0–0.5)
Eosinophils Relative: 1 %
HCT: 41.6 % (ref 36.0–46.0)
Hemoglobin: 13.1 g/dL (ref 12.0–15.0)
Immature Granulocytes: 0 %
Lymphocytes Relative: 17 %
Lymphs Abs: 1.3 10*3/uL (ref 0.7–4.0)
MCH: 28.2 pg (ref 26.0–34.0)
MCHC: 31.5 g/dL (ref 30.0–36.0)
MCV: 89.7 fL (ref 80.0–100.0)
Monocytes Absolute: 0.7 10*3/uL (ref 0.1–1.0)
Monocytes Relative: 9 %
Neutro Abs: 5.3 10*3/uL (ref 1.7–7.7)
Neutrophils Relative %: 73 %
Platelets: 185 10*3/uL (ref 150–400)
RBC: 4.64 MIL/uL (ref 3.87–5.11)
RDW: 13.4 % (ref 11.5–15.5)
WBC: 7.4 10*3/uL (ref 4.0–10.5)
nRBC: 0 % (ref 0.0–0.2)

## 2023-12-28 LAB — BASIC METABOLIC PANEL
Anion gap: 10 (ref 5–15)
BUN: 16 mg/dL (ref 8–23)
CO2: 23 mmol/L (ref 22–32)
Calcium: 9.2 mg/dL (ref 8.9–10.3)
Chloride: 105 mmol/L (ref 98–111)
Creatinine, Ser: 0.66 mg/dL (ref 0.44–1.00)
GFR, Estimated: >60 mL/min (ref >60)
Glucose, Bld: 111 mg/dL — ABNORMAL HIGH (ref 70–99)
Potassium: 3.4 mmol/L — ABNORMAL LOW (ref 3.5–5.1)
Sodium: 138 mmol/L (ref 135–145)

## 2023-12-28 NOTE — ED Notes (Signed)
Patient refused EKG and stated "I do not need to have one done." RN notified.

## 2023-12-28 NOTE — ED Notes (Signed)
[facility] arrived for pt transport

## 2023-12-28 NOTE — Discharge Instructions (Signed)
You were seen in the emergency department after a fall.  Test performed while you are here included EKG, CT scans, chest x-ray, and blood work.  These do not show any significant traumatic injuries or other abnormalities.  Your potassium level is mildly low, we recommend that your facility begin some potassium supplementation. Please make sure to use your cane or walker whenever you ambulate. If you experience recurrent falls, loss of consciousness, chest pain, new dizziness or lightheadedness, or any other concerns, return to the ED for reevaluation.

## 2023-12-28 NOTE — ED Triage Notes (Signed)
Pt had unwitnessed fall at [facility]. Hematoma to head, c collar in place, no blood thinners. Pt a.ox1 at baseline. VSS

## 2023-12-28 NOTE — ED Provider Notes (Signed)
[place] EMERGENCY DEPARTMENT AT [facility] Provider Note  MDM   HPI/ROS:  Penny [name] is a 89 y.o. female with pertinent past medical history of COPD, HTN, HLD, OA, RUL lung cancer status post lobectomy, right frontal cortex traumatic IPH June 2024, and a left temporal ICH of unclear etiology Sept 2024 , reported dementia who presents for evaluation of unwitnessed fall. Patient reportedly had an unwitnessed fall at [facility].  She states she had a fall a couple of days ago, but does not believe she had any additional falls.  She is currently denying any symptoms including headache, vision changes, weakness, numbness, tingling, abdominal pain, dizziness or lightheadedness.  She does report some baseline back pain and pain in her side.  She does not take any anticoagulants.  She states she uses a cane to ambulate at baseline.  Physical exam is notable for: - Hematoma over forehead -- Mild midline thoracic tenderness to palpation (reportedly baseline) -- No C, L-spine tenderness palpation  On my initial evaluation, patient is:  -Vital signs stable. Patient afebrile, hemodynamically stable, and non-toxic appearing. -Additional history obtained from review of prior records  Patient appears to be in her baseline mental state and no evidence of significant traumatic injuries on my initial assessment.  Plan to obtain CT head, C-spine, as well as chest x-ray.  Although patient does have mild thoracic midline tenderness palpation, she reports this is her baseline and she has no signs of trauma to this area or other evidence of external trauma, this do not feel dedicated CT imaging indicated at this time.  Given this was an unwitnessed fall and patient is unable to describe the mechanism, additionally obtaining basic labs and EKG to evaluate for dysrhythmia or metabolic derangements.  Interpretations, interventions, and the patient's course of care are documented below.   -Labs  reviewed: No leukocytosis, no anemia, mild hypokalemia 3.4, otherwise normal creatinine and electrolytes -EKG with no evidence of STEMI -CT head with decreased size of prior left anterior temporal hematoma, no acute intracranial hemorrhage -CT C-spine no evidence of acute fracture -Chest x-ray with old right-sided rib fractures, no pneumothorax.  Patient cervical collar was clinically cleared at bedside.  She remains well-appearing with a GCS of 14 which is her baseline.  She was able to ambulate without difficulty.  The patient was thus felt appropriate for discharge at this time.  She remained in the emergency department pending transport back to her facility.  She remained stable and had no acute events plan of care in the emergency department.  Disposition:  I discussed the plan for discharge with the patient and/or their surrogate at bedside prior to discharge and they were in agreement with the plan and verbalized understanding of the return precautions provided. All questions answered to the best of my ability. Ultimately, the patient was discharged in stable condition with stable vital signs. I am reassured that they are capable of close follow up and good social support at home.   Clinical Impression:  1. Fall, initial encounter     Rx / DC Orders ED Discharge Orders     None       The plan for this patient was discussed with Dr. Theresia Lo, who voiced agreement and who oversaw evaluation and treatment of this patient.   Clinical Complexity A medically appropriate history, review of systems, and physical exam was performed.  My independent interpretations of EKG, labs, and radiology are documented in the ED course above.  If decision rules were used in this patient's evaluation, they are listed below.   Patient's presentation is most consistent with acute presentation with potential threat to life or bodily function.  Medical Decision Making Amount and/or Complexity of  Data Reviewed Labs: ordered. Radiology: ordered.    HPI/ROS      See MDM section for pertinent HPI and ROS. A complete ROS was performed with pertinent positives/negatives noted above.   Past Medical History:  Diagnosis Date   COPD (chronic obstructive pulmonary disease) with emphysema (HCC)    COVID-19 12/09/2019   Hyperlipidemia    Hypertension    Lung cancer, upper lobe (HCC) 12/04/1996   RUL   Osteoporosis    S/P partial lobectomy of lung    [id number]   Thigh shingles 12/04/1996   Thoracic aorta atherosclerosis (HCC)    Vitamin D deficiency     Past Surgical History:  Procedure Laterality Date   CARPAL TUNNEL RELEASE Right    2009   IR KYPHO THORACIC WITH BONE BIOPSY  10/10/2023   IR RADIOLOGIST EVAL & MGMT  10/08/2023   LUNG REMOVAL, PARTIAL Right    RUL 1998   OLECRANON BURSECTOMY Left 03/20/2017   Procedure: EXICISION OLECRANON BURSA LEFT ELBOW;  Surgeon: Cindee Salt, MD;  Location: [facility];  Service: Orthopedics;  Laterality: Left;  with scb block in preop   SHOULDER ADHESION RELEASE Right    2004      Physical Exam   ED Triage Vitals  Encounter Vitals Group     BP 12/28/23 1736 (!) 135/92     Systolic BP Percentile --      Diastolic BP Percentile --      Pulse Rate 12/28/23 1736 (!) 103     Resp 12/28/23 1736 15     Temp 12/28/23 1736 98 F (36.7 C)     Temp Source 12/28/23 1736 Oral     SpO2 12/28/23 1736 95 %     Weight --      Height --      Head Circumference --      Peak Flow --      Pain Score 12/28/23 1644 0     Pain Loc --      Pain Education --      Exclude from Growth Chart --     Physical Exam Gen: NAD. Appears comfortable.  Frail, elderly female HENT: Conjunctiva clear, PERRL, EOMI. MMM.  Evolving contusion over forehead. Back: No C or L-spine midline tenderness to palpation.  Mild midline thoracic tenderness to palpation with no step-offs or deformities, baseline per patient. CV: RRR. No M/R/G Pulm: Lungs CTAB with  no wheezing, rales, or rhonchi.  GI: Abdomen soft, non-tender, non-distended. Normal bowel sounds in all 4 quadrants. MSK/Skin: No lower extremity edema. Extremities warm, well-perfused with 2+ pulses in all 4 extremities.  Pelvis stable to anterior and lateral compression. Neuro: Alert and oriented to person, place, not time.  GCS 14.  Moving all extremities equally.  Symmetric facial movements.    Procedures   If procedures were preformed on this patient, they are listed below:  Procedures   Mikeal Hawthorne, MD Emergency Medicine PGY-2   Please note that this documentation was produced with the assistance of voice-to-text technology and may contain errors.    Mikeal Hawthorne, MD 12/29/23 0111    Elayne Snare K, DO 12/29/23 2023

## 2024-01-02 DIAGNOSIS — M6281 Muscle weakness (generalized): Secondary | ICD-10-CM | POA: Diagnosis not present

## 2024-01-02 DIAGNOSIS — R569 Unspecified convulsions: Secondary | ICD-10-CM | POA: Diagnosis not present

## 2024-01-02 DIAGNOSIS — E559 Vitamin D deficiency, unspecified: Secondary | ICD-10-CM | POA: Diagnosis not present

## 2024-01-02 DIAGNOSIS — I69311 Memory deficit following cerebral infarction: Secondary | ICD-10-CM | POA: Diagnosis not present

## 2024-01-02 DIAGNOSIS — J449 Chronic obstructive pulmonary disease, unspecified: Secondary | ICD-10-CM | POA: Diagnosis not present

## 2024-01-02 DIAGNOSIS — I2510 Atherosclerotic heart disease of native coronary artery without angina pectoris: Secondary | ICD-10-CM | POA: Diagnosis not present

## 2024-01-02 DIAGNOSIS — R2689 Other abnormalities of gait and mobility: Secondary | ICD-10-CM | POA: Diagnosis not present

## 2024-01-02 DIAGNOSIS — I680 Cerebral amyloid angiopathy: Secondary | ICD-10-CM | POA: Diagnosis not present

## 2024-01-02 DIAGNOSIS — I69351 Hemiplegia and hemiparesis following cerebral infarction affecting right dominant side: Secondary | ICD-10-CM | POA: Diagnosis not present

## 2024-01-02 DIAGNOSIS — S42202D Unspecified fracture of upper end of left humerus, subsequent encounter for fracture with routine healing: Secondary | ICD-10-CM | POA: Diagnosis not present

## 2024-01-02 DIAGNOSIS — I619 Nontraumatic intracerebral hemorrhage, unspecified: Secondary | ICD-10-CM | POA: Diagnosis not present

## 2024-01-02 DIAGNOSIS — E785 Hyperlipidemia, unspecified: Secondary | ICD-10-CM | POA: Diagnosis not present

## 2024-01-07 DIAGNOSIS — Z85118 Personal history of other malignant neoplasm of bronchus and lung: Secondary | ICD-10-CM | POA: Diagnosis not present

## 2024-01-07 DIAGNOSIS — I2510 Atherosclerotic heart disease of native coronary artery without angina pectoris: Secondary | ICD-10-CM | POA: Diagnosis not present

## 2024-01-07 DIAGNOSIS — I10 Essential (primary) hypertension: Secondary | ICD-10-CM | POA: Diagnosis not present

## 2024-01-07 DIAGNOSIS — Z8616 Personal history of COVID-19: Secondary | ICD-10-CM | POA: Diagnosis not present

## 2024-01-07 DIAGNOSIS — J439 Emphysema, unspecified: Secondary | ICD-10-CM | POA: Diagnosis not present

## 2024-01-07 DIAGNOSIS — I700 Atherosclerosis of aorta: Secondary | ICD-10-CM | POA: Diagnosis not present

## 2024-01-07 DIAGNOSIS — I69251 Hemiplegia and hemiparesis following other nontraumatic intracranial hemorrhage affecting right dominant side: Secondary | ICD-10-CM | POA: Diagnosis not present

## 2024-01-07 DIAGNOSIS — E785 Hyperlipidemia, unspecified: Secondary | ICD-10-CM | POA: Diagnosis not present

## 2024-01-07 DIAGNOSIS — I69211 Memory deficit following other nontraumatic intracranial hemorrhage: Secondary | ICD-10-CM | POA: Diagnosis not present

## 2024-01-07 DIAGNOSIS — Z87891 Personal history of nicotine dependence: Secondary | ICD-10-CM | POA: Diagnosis not present

## 2024-01-07 DIAGNOSIS — M810 Age-related osteoporosis without current pathological fracture: Secondary | ICD-10-CM | POA: Diagnosis not present

## 2024-01-07 DIAGNOSIS — Z9181 History of falling: Secondary | ICD-10-CM | POA: Diagnosis not present

## 2024-01-07 DIAGNOSIS — E559 Vitamin D deficiency, unspecified: Secondary | ICD-10-CM | POA: Diagnosis not present

## 2024-01-07 DIAGNOSIS — M159 Polyosteoarthritis, unspecified: Secondary | ICD-10-CM | POA: Diagnosis not present

## 2024-01-07 DIAGNOSIS — I69228 Other speech and language deficits following other nontraumatic intracranial hemorrhage: Secondary | ICD-10-CM | POA: Diagnosis not present

## 2024-01-08 DIAGNOSIS — Z85118 Personal history of other malignant neoplasm of bronchus and lung: Secondary | ICD-10-CM | POA: Diagnosis not present

## 2024-01-08 DIAGNOSIS — Z87891 Personal history of nicotine dependence: Secondary | ICD-10-CM | POA: Diagnosis not present

## 2024-01-08 DIAGNOSIS — I2510 Atherosclerotic heart disease of native coronary artery without angina pectoris: Secondary | ICD-10-CM | POA: Diagnosis not present

## 2024-01-08 DIAGNOSIS — I69251 Hemiplegia and hemiparesis following other nontraumatic intracranial hemorrhage affecting right dominant side: Secondary | ICD-10-CM | POA: Diagnosis not present

## 2024-01-08 DIAGNOSIS — E559 Vitamin D deficiency, unspecified: Secondary | ICD-10-CM | POA: Diagnosis not present

## 2024-01-08 DIAGNOSIS — I700 Atherosclerosis of aorta: Secondary | ICD-10-CM | POA: Diagnosis not present

## 2024-01-08 DIAGNOSIS — Z8616 Personal history of COVID-19: Secondary | ICD-10-CM | POA: Diagnosis not present

## 2024-01-08 DIAGNOSIS — M810 Age-related osteoporosis without current pathological fracture: Secondary | ICD-10-CM | POA: Diagnosis not present

## 2024-01-08 DIAGNOSIS — Z9181 History of falling: Secondary | ICD-10-CM | POA: Diagnosis not present

## 2024-01-08 DIAGNOSIS — J439 Emphysema, unspecified: Secondary | ICD-10-CM | POA: Diagnosis not present

## 2024-01-08 DIAGNOSIS — E785 Hyperlipidemia, unspecified: Secondary | ICD-10-CM | POA: Diagnosis not present

## 2024-01-08 DIAGNOSIS — I69228 Other speech and language deficits following other nontraumatic intracranial hemorrhage: Secondary | ICD-10-CM | POA: Diagnosis not present

## 2024-01-08 DIAGNOSIS — M159 Polyosteoarthritis, unspecified: Secondary | ICD-10-CM | POA: Diagnosis not present

## 2024-01-08 DIAGNOSIS — I69211 Memory deficit following other nontraumatic intracranial hemorrhage: Secondary | ICD-10-CM | POA: Diagnosis not present

## 2024-01-08 DIAGNOSIS — I10 Essential (primary) hypertension: Secondary | ICD-10-CM | POA: Diagnosis not present

## 2024-01-09 DIAGNOSIS — I10 Essential (primary) hypertension: Secondary | ICD-10-CM | POA: Diagnosis not present

## 2024-01-09 DIAGNOSIS — I2510 Atherosclerotic heart disease of native coronary artery without angina pectoris: Secondary | ICD-10-CM | POA: Diagnosis not present

## 2024-01-09 DIAGNOSIS — I69251 Hemiplegia and hemiparesis following other nontraumatic intracranial hemorrhage affecting right dominant side: Secondary | ICD-10-CM | POA: Diagnosis not present

## 2024-01-09 DIAGNOSIS — M159 Polyosteoarthritis, unspecified: Secondary | ICD-10-CM | POA: Diagnosis not present

## 2024-01-09 DIAGNOSIS — Z85118 Personal history of other malignant neoplasm of bronchus and lung: Secondary | ICD-10-CM | POA: Diagnosis not present

## 2024-01-09 DIAGNOSIS — E559 Vitamin D deficiency, unspecified: Secondary | ICD-10-CM | POA: Diagnosis not present

## 2024-01-09 DIAGNOSIS — J439 Emphysema, unspecified: Secondary | ICD-10-CM | POA: Diagnosis not present

## 2024-01-09 DIAGNOSIS — Z8616 Personal history of COVID-19: Secondary | ICD-10-CM | POA: Diagnosis not present

## 2024-01-09 DIAGNOSIS — I69228 Other speech and language deficits following other nontraumatic intracranial hemorrhage: Secondary | ICD-10-CM | POA: Diagnosis not present

## 2024-01-09 DIAGNOSIS — I700 Atherosclerosis of aorta: Secondary | ICD-10-CM | POA: Diagnosis not present

## 2024-01-09 DIAGNOSIS — Z87891 Personal history of nicotine dependence: Secondary | ICD-10-CM | POA: Diagnosis not present

## 2024-01-09 DIAGNOSIS — I69211 Memory deficit following other nontraumatic intracranial hemorrhage: Secondary | ICD-10-CM | POA: Diagnosis not present

## 2024-01-09 DIAGNOSIS — Z9181 History of falling: Secondary | ICD-10-CM | POA: Diagnosis not present

## 2024-01-09 DIAGNOSIS — M810 Age-related osteoporosis without current pathological fracture: Secondary | ICD-10-CM | POA: Diagnosis not present

## 2024-01-09 DIAGNOSIS — E785 Hyperlipidemia, unspecified: Secondary | ICD-10-CM | POA: Diagnosis not present

## 2024-01-11 DIAGNOSIS — M810 Age-related osteoporosis without current pathological fracture: Secondary | ICD-10-CM | POA: Diagnosis not present

## 2024-01-11 DIAGNOSIS — M159 Polyosteoarthritis, unspecified: Secondary | ICD-10-CM | POA: Diagnosis not present

## 2024-01-11 DIAGNOSIS — Z9181 History of falling: Secondary | ICD-10-CM | POA: Diagnosis not present

## 2024-01-11 DIAGNOSIS — R569 Unspecified convulsions: Secondary | ICD-10-CM | POA: Diagnosis not present

## 2024-01-11 DIAGNOSIS — I700 Atherosclerosis of aorta: Secondary | ICD-10-CM | POA: Diagnosis not present

## 2024-01-11 DIAGNOSIS — I10 Essential (primary) hypertension: Secondary | ICD-10-CM | POA: Diagnosis not present

## 2024-01-11 DIAGNOSIS — Z85118 Personal history of other malignant neoplasm of bronchus and lung: Secondary | ICD-10-CM | POA: Diagnosis not present

## 2024-01-11 DIAGNOSIS — E559 Vitamin D deficiency, unspecified: Secondary | ICD-10-CM | POA: Diagnosis not present

## 2024-01-11 DIAGNOSIS — I69211 Memory deficit following other nontraumatic intracranial hemorrhage: Secondary | ICD-10-CM | POA: Diagnosis not present

## 2024-01-11 DIAGNOSIS — Z8616 Personal history of COVID-19: Secondary | ICD-10-CM | POA: Diagnosis not present

## 2024-01-11 DIAGNOSIS — I2510 Atherosclerotic heart disease of native coronary artery without angina pectoris: Secondary | ICD-10-CM | POA: Diagnosis not present

## 2024-01-11 DIAGNOSIS — J439 Emphysema, unspecified: Secondary | ICD-10-CM | POA: Diagnosis not present

## 2024-01-11 DIAGNOSIS — Z87891 Personal history of nicotine dependence: Secondary | ICD-10-CM | POA: Diagnosis not present

## 2024-01-11 DIAGNOSIS — I69228 Other speech and language deficits following other nontraumatic intracranial hemorrhage: Secondary | ICD-10-CM | POA: Diagnosis not present

## 2024-01-11 DIAGNOSIS — E785 Hyperlipidemia, unspecified: Secondary | ICD-10-CM | POA: Diagnosis not present

## 2024-01-11 DIAGNOSIS — I69251 Hemiplegia and hemiparesis following other nontraumatic intracranial hemorrhage affecting right dominant side: Secondary | ICD-10-CM | POA: Diagnosis not present

## 2024-01-14 DIAGNOSIS — I10 Essential (primary) hypertension: Secondary | ICD-10-CM | POA: Diagnosis not present

## 2024-01-14 DIAGNOSIS — J439 Emphysema, unspecified: Secondary | ICD-10-CM | POA: Diagnosis not present

## 2024-01-14 DIAGNOSIS — I2510 Atherosclerotic heart disease of native coronary artery without angina pectoris: Secondary | ICD-10-CM | POA: Diagnosis not present

## 2024-01-14 DIAGNOSIS — I69228 Other speech and language deficits following other nontraumatic intracranial hemorrhage: Secondary | ICD-10-CM | POA: Diagnosis not present

## 2024-01-14 DIAGNOSIS — I69251 Hemiplegia and hemiparesis following other nontraumatic intracranial hemorrhage affecting right dominant side: Secondary | ICD-10-CM | POA: Diagnosis not present

## 2024-01-14 DIAGNOSIS — E559 Vitamin D deficiency, unspecified: Secondary | ICD-10-CM | POA: Diagnosis not present

## 2024-01-14 DIAGNOSIS — Z87891 Personal history of nicotine dependence: Secondary | ICD-10-CM | POA: Diagnosis not present

## 2024-01-14 DIAGNOSIS — Z8616 Personal history of COVID-19: Secondary | ICD-10-CM | POA: Diagnosis not present

## 2024-01-14 DIAGNOSIS — Z9181 History of falling: Secondary | ICD-10-CM | POA: Diagnosis not present

## 2024-01-14 DIAGNOSIS — E785 Hyperlipidemia, unspecified: Secondary | ICD-10-CM | POA: Diagnosis not present

## 2024-01-14 DIAGNOSIS — Z85118 Personal history of other malignant neoplasm of bronchus and lung: Secondary | ICD-10-CM | POA: Diagnosis not present

## 2024-01-14 DIAGNOSIS — M810 Age-related osteoporosis without current pathological fracture: Secondary | ICD-10-CM | POA: Diagnosis not present

## 2024-01-14 DIAGNOSIS — I700 Atherosclerosis of aorta: Secondary | ICD-10-CM | POA: Diagnosis not present

## 2024-01-14 DIAGNOSIS — I69211 Memory deficit following other nontraumatic intracranial hemorrhage: Secondary | ICD-10-CM | POA: Diagnosis not present

## 2024-01-14 DIAGNOSIS — M159 Polyosteoarthritis, unspecified: Secondary | ICD-10-CM | POA: Diagnosis not present

## 2024-01-15 ENCOUNTER — Emergency Department (HOSPITAL_COMMUNITY): Payer: Medicare Other

## 2024-01-15 ENCOUNTER — Inpatient Hospital Stay (HOSPITAL_COMMUNITY)
Admission: EM | Admit: 2024-01-15 | Discharge: 2024-02-02 | DRG: 065 | Disposition: E | Payer: Medicare Other | Attending: Neurology | Admitting: Neurology

## 2024-01-15 ENCOUNTER — Other Ambulatory Visit: Payer: Self-pay

## 2024-01-15 DIAGNOSIS — Z515 Encounter for palliative care: Secondary | ICD-10-CM | POA: Diagnosis not present

## 2024-01-15 DIAGNOSIS — I10 Essential (primary) hypertension: Secondary | ICD-10-CM | POA: Diagnosis not present

## 2024-01-15 DIAGNOSIS — R29716 NIHSS score 16: Secondary | ICD-10-CM | POA: Diagnosis not present

## 2024-01-15 DIAGNOSIS — Z743 Need for continuous supervision: Secondary | ICD-10-CM | POA: Diagnosis not present

## 2024-01-15 DIAGNOSIS — I671 Cerebral aneurysm, nonruptured: Secondary | ICD-10-CM | POA: Diagnosis not present

## 2024-01-15 DIAGNOSIS — Z66 Do not resuscitate: Secondary | ICD-10-CM | POA: Diagnosis not present

## 2024-01-15 DIAGNOSIS — R1310 Dysphagia, unspecified: Secondary | ICD-10-CM | POA: Diagnosis present

## 2024-01-15 DIAGNOSIS — Z7982 Long term (current) use of aspirin: Secondary | ICD-10-CM

## 2024-01-15 DIAGNOSIS — I161 Hypertensive emergency: Secondary | ICD-10-CM | POA: Diagnosis not present

## 2024-01-15 DIAGNOSIS — Z803 Family history of malignant neoplasm of breast: Secondary | ICD-10-CM

## 2024-01-15 DIAGNOSIS — I619 Nontraumatic intracerebral hemorrhage, unspecified: Principal | ICD-10-CM | POA: Diagnosis present

## 2024-01-15 DIAGNOSIS — Z801 Family history of malignant neoplasm of trachea, bronchus and lung: Secondary | ICD-10-CM | POA: Diagnosis not present

## 2024-01-15 DIAGNOSIS — R531 Weakness: Secondary | ICD-10-CM | POA: Diagnosis not present

## 2024-01-15 DIAGNOSIS — R9082 White matter disease, unspecified: Secondary | ICD-10-CM | POA: Diagnosis not present

## 2024-01-15 DIAGNOSIS — I611 Nontraumatic intracerebral hemorrhage in hemisphere, cortical: Secondary | ICD-10-CM | POA: Diagnosis not present

## 2024-01-15 DIAGNOSIS — Z881 Allergy status to other antibiotic agents status: Secondary | ICD-10-CM

## 2024-01-15 DIAGNOSIS — E876 Hypokalemia: Secondary | ICD-10-CM | POA: Diagnosis present

## 2024-01-15 DIAGNOSIS — Z85118 Personal history of other malignant neoplasm of bronchus and lung: Secondary | ICD-10-CM

## 2024-01-15 DIAGNOSIS — I69391 Dysphagia following cerebral infarction: Secondary | ICD-10-CM

## 2024-01-15 DIAGNOSIS — R29810 Facial weakness: Secondary | ICD-10-CM | POA: Diagnosis not present

## 2024-01-15 DIAGNOSIS — R414 Neurologic neglect syndrome: Secondary | ICD-10-CM | POA: Diagnosis not present

## 2024-01-15 DIAGNOSIS — M810 Age-related osteoporosis without current pathological fracture: Secondary | ICD-10-CM | POA: Diagnosis present

## 2024-01-15 DIAGNOSIS — Z79899 Other long term (current) drug therapy: Secondary | ICD-10-CM

## 2024-01-15 DIAGNOSIS — S06320A Contusion and laceration of left cerebrum without loss of consciousness, initial encounter: Secondary | ICD-10-CM | POA: Diagnosis not present

## 2024-01-15 DIAGNOSIS — I672 Cerebral atherosclerosis: Secondary | ICD-10-CM | POA: Diagnosis not present

## 2024-01-15 DIAGNOSIS — I615 Nontraumatic intracerebral hemorrhage, intraventricular: Secondary | ICD-10-CM | POA: Diagnosis not present

## 2024-01-15 DIAGNOSIS — Z8616 Personal history of COVID-19: Secondary | ICD-10-CM

## 2024-01-15 DIAGNOSIS — Z751 Person awaiting admission to adequate facility elsewhere: Secondary | ICD-10-CM

## 2024-01-15 DIAGNOSIS — Z902 Acquired absence of lung [part of]: Secondary | ICD-10-CM

## 2024-01-15 DIAGNOSIS — J439 Emphysema, unspecified: Secondary | ICD-10-CM | POA: Diagnosis present

## 2024-01-15 DIAGNOSIS — G8194 Hemiplegia, unspecified affecting left nondominant side: Secondary | ICD-10-CM | POA: Diagnosis present

## 2024-01-15 DIAGNOSIS — H53462 Homonymous bilateral field defects, left side: Secondary | ICD-10-CM | POA: Diagnosis not present

## 2024-01-15 DIAGNOSIS — E559 Vitamin D deficiency, unspecified: Secondary | ICD-10-CM | POA: Diagnosis present

## 2024-01-15 DIAGNOSIS — Z8249 Family history of ischemic heart disease and other diseases of the circulatory system: Secondary | ICD-10-CM | POA: Diagnosis not present

## 2024-01-15 DIAGNOSIS — E785 Hyperlipidemia, unspecified: Secondary | ICD-10-CM | POA: Diagnosis not present

## 2024-01-15 DIAGNOSIS — Z888 Allergy status to other drugs, medicaments and biological substances status: Secondary | ICD-10-CM

## 2024-01-15 DIAGNOSIS — I680 Cerebral amyloid angiopathy: Secondary | ICD-10-CM | POA: Diagnosis present

## 2024-01-15 DIAGNOSIS — E854 Organ-limited amyloidosis: Secondary | ICD-10-CM | POA: Diagnosis not present

## 2024-01-15 DIAGNOSIS — R54 Age-related physical debility: Secondary | ICD-10-CM | POA: Diagnosis present

## 2024-01-15 DIAGNOSIS — Z87891 Personal history of nicotine dependence: Secondary | ICD-10-CM

## 2024-01-15 LAB — ETHANOL: Alcohol, Ethyl (B): <10 mg/dL (ref <10)

## 2024-01-15 LAB — I-STAT CHEM 8, ED
BUN: 11 mg/dL (ref 8–23)
BUN: 13 mg/dL (ref 8–23)
Calcium, Ion: 1.11 mmol/L — ABNORMAL LOW (ref 1.15–1.40)
Calcium, Ion: 1.13 mmol/L — ABNORMAL LOW (ref 1.15–1.40)
Chloride: 102 mmol/L (ref 98–111)
Chloride: 106 mmol/L (ref 98–111)
Creatinine, Ser: 0.6 mg/dL (ref 0.44–1.00)
Creatinine, Ser: 0.6 mg/dL (ref 0.44–1.00)
Glucose, Bld: 101 mg/dL — ABNORMAL HIGH (ref 70–99)
Glucose, Bld: 113 mg/dL — ABNORMAL HIGH (ref 70–99)
HCT: 34 % — ABNORMAL LOW (ref 36.0–46.0)
HCT: 37 % (ref 36.0–46.0)
Hemoglobin: 11.6 g/dL — ABNORMAL LOW (ref 12.0–15.0)
Hemoglobin: 12.6 g/dL (ref 12.0–15.0)
Potassium: 3.3 mmol/L — ABNORMAL LOW (ref 3.5–5.1)
Potassium: 3.4 mmol/L — ABNORMAL LOW (ref 3.5–5.1)
Sodium: 140 mmol/L (ref 135–145)
Sodium: 143 mmol/L (ref 135–145)
TCO2: 28 mmol/L (ref 22–32)
TCO2: 28 mmol/L (ref 22–32)

## 2024-01-15 LAB — COMPREHENSIVE METABOLIC PANEL
ALT: 15 U/L (ref 0–44)
AST: 21 U/L (ref 15–41)
Albumin: 3.2 g/dL — ABNORMAL LOW (ref 3.5–5.0)
Alkaline Phosphatase: 82 U/L (ref 38–126)
Anion gap: 9 (ref 5–15)
BUN: 11 mg/dL (ref 8–23)
CO2: 27 mmol/L (ref 22–32)
Calcium: 8.8 mg/dL — ABNORMAL LOW (ref 8.9–10.3)
Chloride: 105 mmol/L (ref 98–111)
Creatinine, Ser: 0.64 mg/dL (ref 0.44–1.00)
GFR, Estimated: >60 mL/min (ref >60)
Glucose, Bld: 120 mg/dL — ABNORMAL HIGH (ref 70–99)
Potassium: 3.3 mmol/L — ABNORMAL LOW (ref 3.5–5.1)
Sodium: 141 mmol/L (ref 135–145)
Total Bilirubin: 0.8 mg/dL (ref 0.0–1.2)
Total Protein: 5.5 g/dL — ABNORMAL LOW (ref 6.5–8.1)

## 2024-01-15 LAB — DIFFERENTIAL
Abs Immature Granulocytes: 0.03 10*3/uL (ref 0.00–0.07)
Basophils Absolute: 0 10*3/uL (ref 0.0–0.1)
Basophils Relative: 0 %
Eosinophils Absolute: 0.1 10*3/uL (ref 0.0–0.5)
Eosinophils Relative: 2 %
Immature Granulocytes: 1 %
Lymphocytes Relative: 24 %
Lymphs Abs: 1.2 10*3/uL (ref 0.7–4.0)
Monocytes Absolute: 0.4 10*3/uL (ref 0.1–1.0)
Monocytes Relative: 9 %
Neutro Abs: 3.1 10*3/uL (ref 1.7–7.7)
Neutrophils Relative %: 64 %

## 2024-01-15 LAB — CBC
HCT: 34.5 % — ABNORMAL LOW (ref 36.0–46.0)
Hemoglobin: 10.9 g/dL — ABNORMAL LOW (ref 12.0–15.0)
MCH: 28.8 pg (ref 26.0–34.0)
MCHC: 31.6 g/dL (ref 30.0–36.0)
MCV: 91 fL (ref 80.0–100.0)
Platelets: 230 10*3/uL (ref 150–400)
RBC: 3.79 MIL/uL — ABNORMAL LOW (ref 3.87–5.11)
RDW: 14.8 % (ref 11.5–15.5)
WBC: 4.8 10*3/uL (ref 4.0–10.5)
nRBC: 0 % (ref 0.0–0.2)

## 2024-01-15 LAB — PROTIME-INR
INR: 1 (ref 0.8–1.2)
Prothrombin Time: 13.4 s (ref 11.4–15.2)

## 2024-01-15 LAB — I-STAT CG4 LACTIC ACID, ED: Lactic Acid, Venous: 1.5 mmol/L (ref 0.5–1.9)

## 2024-01-15 LAB — APTT: aPTT: 26 s (ref 24–36)

## 2024-01-15 LAB — CBG MONITORING, ED: Glucose-Capillary: 99 mg/dL (ref 70–99)

## 2024-01-15 MED ORDER — SENNOSIDES-DOCUSATE SODIUM 8.6-50 MG PO TABS
1.0000 | ORAL_TABLET | Freq: Two times a day (BID) | ORAL | Status: DC
  Filled 2024-01-15: qty 1

## 2024-01-15 MED ORDER — IOHEXOL 350 MG/ML SOLN
75.0000 mL | Freq: Once | INTRAVENOUS | Status: AC | PRN
  Administered 2024-01-15: 75 mL via INTRAVENOUS

## 2024-01-15 MED ORDER — ONDANSETRON HCL 4 MG/2ML IJ SOLN
4.0000 mg | Freq: Once | INTRAMUSCULAR | Status: AC
  Administered 2024-01-15: 4 mg via INTRAVENOUS

## 2024-01-15 MED ORDER — PANTOPRAZOLE SODIUM 40 MG IV SOLR: 40.0000 mg | Freq: Every day | INTRAVENOUS | Status: DC

## 2024-01-15 MED ORDER — OXYCODONE HCL 20 MG/ML PO CONC: 5.0000 mg | ORAL | Status: DC | PRN

## 2024-01-15 MED ORDER — ONDANSETRON 4 MG PO TBDP: 4.0000 mg | ORAL_TABLET | Freq: Four times a day (QID) | ORAL | Status: DC | PRN

## 2024-01-15 MED ORDER — MORPHINE SULFATE (PF) 2 MG/ML IV SOLN
1.0000 mg | INTRAVENOUS | Status: DC | PRN
  Administered 2024-01-17 – 2024-01-20 (×5): 2 mg via INTRAVENOUS
  Filled 2024-01-15 (×5): qty 1

## 2024-01-15 MED ORDER — SODIUM CHLORIDE 0.9% FLUSH
3.0000 mL | Freq: Two times a day (BID) | INTRAVENOUS | Status: DC
  Administered 2024-01-15 – 2024-01-20 (×11): 3 mL via INTRAVENOUS

## 2024-01-15 MED ORDER — ACETAMINOPHEN 650 MG RE SUPP: 650.0000 mg | RECTAL | Status: DC | PRN

## 2024-01-15 MED ORDER — SODIUM CHLORIDE 0.9% FLUSH
3.0000 mL | Freq: Once | INTRAVENOUS | Status: AC
  Administered 2024-01-15: 3 mL via INTRAVENOUS

## 2024-01-15 MED ORDER — SENNA 8.6 MG PO TABS: 1.0000 | ORAL_TABLET | Freq: Every evening | ORAL | Status: DC | PRN

## 2024-01-15 MED ORDER — ACETAMINOPHEN 325 MG PO TABS: 650.0000 mg | ORAL_TABLET | ORAL | Status: DC | PRN

## 2024-01-15 MED ORDER — MONTELUKAST SODIUM 10 MG PO TABS
10.0000 mg | ORAL_TABLET | Freq: Every morning | ORAL | Status: DC
  Administered 2024-01-15: 10 mg via ORAL
  Filled 2024-01-15 (×2): qty 1

## 2024-01-15 MED ORDER — HALOPERIDOL LACTATE 2 MG/ML PO CONC
0.5000 mg | ORAL | Status: DC | PRN
Start: 2024-01-15 — End: 2024-01-20

## 2024-01-15 MED ORDER — LORAZEPAM 2 MG/ML IJ SOLN
1.0000 mg | INTRAMUSCULAR | Status: DC | PRN
Start: 2024-01-15 — End: 2024-01-20

## 2024-01-15 MED ORDER — STROKE: EARLY STAGES OF RECOVERY BOOK
Freq: Once | Status: DC
  Filled 2024-01-15: qty 1

## 2024-01-15 MED ORDER — BISACODYL 10 MG RE SUPP: 10.0000 mg | Freq: Every day | RECTAL | Status: DC | PRN

## 2024-01-15 MED ORDER — SODIUM CHLORIDE 0.9% FLUSH: 3.0000 mL | INTRAVENOUS | Status: DC | PRN

## 2024-01-15 MED ORDER — POTASSIUM CHLORIDE 10 MEQ/100ML IV SOLN
10.0000 meq | INTRAVENOUS | Status: DC
  Administered 2024-01-15 (×4): 10 meq via INTRAVENOUS
  Filled 2024-01-15 (×4): qty 100

## 2024-01-15 MED ORDER — LEVETIRACETAM 500 MG PO TABS
500.0000 mg | ORAL_TABLET | Freq: Two times a day (BID) | ORAL | Status: DC
  Administered 2024-01-15: 500 mg via ORAL
  Filled 2024-01-15 (×3): qty 1

## 2024-01-15 MED ORDER — POLYVINYL ALCOHOL 1.4 % OP SOLN: 1.0000 [drp] | Freq: Four times a day (QID) | OPHTHALMIC | Status: DC | PRN

## 2024-01-15 MED ORDER — FLEET ENEMA RE ENEM: 1.0000 | ENEMA | Freq: Every day | RECTAL | Status: DC | PRN

## 2024-01-15 MED ORDER — HALOPERIDOL LACTATE 5 MG/ML IJ SOLN: 0.5000 mg | INTRAMUSCULAR | Status: DC | PRN

## 2024-01-15 MED ORDER — LORAZEPAM 2 MG/ML PO CONC: 1.0000 mg | ORAL | Status: DC | PRN

## 2024-01-15 MED ORDER — ACETAMINOPHEN 160 MG/5ML PO SOLN: 650.0000 mg | ORAL | Status: DC | PRN

## 2024-01-15 MED ORDER — CLEVIDIPINE BUTYRATE 0.5 MG/ML IV EMUL
0.0000 mg/h | INTRAVENOUS | Status: DC
  Administered 2024-01-15: 2 mg/h via INTRAVENOUS
  Filled 2024-01-15: qty 100

## 2024-01-15 MED ORDER — ONDANSETRON HCL 4 MG/2ML IJ SOLN: 4.0000 mg | Freq: Four times a day (QID) | INTRAMUSCULAR | Status: DC | PRN

## 2024-01-15 MED ORDER — BIOTENE DRY MOUTH MT LIQD: 15.0000 mL | OROMUCOSAL | Status: DC | PRN

## 2024-01-15 MED ORDER — LORAZEPAM 1 MG PO TABS: 1.0000 mg | ORAL_TABLET | ORAL | Status: DC | PRN

## 2024-01-15 MED ORDER — HALOPERIDOL 1 MG PO TABS: 0.5000 mg | ORAL_TABLET | ORAL | Status: DC | PRN

## 2024-01-15 MED ORDER — DIPHENHYDRAMINE HCL 50 MG/ML IJ SOLN: 12.5000 mg | INTRAMUSCULAR | Status: DC | PRN

## 2024-01-15 NOTE — Code Documentation (Signed)
Responded to Code Stroke called at 0604 for L sided facial droop/neglect/weakness, LSN-2000. Pt arrived at 0623, CBG-99, NIH-16, CT head-35cc parasagittal R frontal ICH with intraventricular extension. TNK not given-bleed. Cleviprex gtt started and titrated to keep SBP 130-150.  Plan ICU admission. Please complete VS/neuro checks q1h or per ICU protocol if more frequent.

## 2024-01-15 NOTE — Progress Notes (Signed)
[facility] Liaison Note   There are no routine beds available at [facility] at this time.    Hospital liaison will continue with assessment tomorrow or sooner if a room becomes available.    Please do not hesitate to call with questions.    Glenna Fellows, BSN, RN, [facility] Liaison [phone]

## 2024-01-15 NOTE — ED Provider Notes (Signed)
[place] EMERGENCY DEPARTMENT AT [facility] Provider Note   CSN: [id number] Arrival date & time: 01/15/24  8295  An emergency department physician performed an initial assessment on this suspected stroke patient at [phone].  History  Chief Complaint  Patient presents with   Code Stroke    Penny [name] is a 89 y.o. female.  HPI   89 year old female with medical history significant for HTN, HLD, COPD, prior lung cancer status post lobectomy, right frontal cortex traumatic IPH in June 2024, left temporal ICH of unclear etiology in September 2024 presenting to the emergency department as a code stroke.  Patient's last known normal was 2000 last night.  She was found to have right-sided gaze, left arm weakness and a left-sided facial droop.  On arrival, the patient was cleared at the bridge and taken to the CT scanner for code stroke imaging and found to have right frontal ICH with ventricular extension.  Home Medications Prior to Admission medications   Medication Sig Start Date End Date Taking? Authorizing Provider  acetaminophen (TYLENOL) 325 MG tablet Take 650 mg by mouth as needed for mild pain or moderate pain.    [provider]  atorvastatin (LIPITOR) 10 MG tablet Take 1 tablet (10 mg total) by mouth daily. 11/28/23   Lonia Blood, MD  buPROPion (WELLBUTRIN) 75 MG tablet TAKE 1 TABLET BY MOUTH DAILY 12/31/23   Adela Glimpse, NP  Cholecalciferol 125 MCG (5000 UT) capsule Take 1 capsules Daily Patient taking differently: Take 5,000 Units by mouth daily. 12/01/21   Judd Gaudier, NP  Cyanocobalamin (VITAMIN B-12) 1000 MCG SUBL Takes 1 tab SL Daily Patient taking differently: Take 1 tablet by mouth daily. 05/02/19   Lucky Cowboy, MD  ezetimibe (ZETIA) 10 MG tablet Take 1 tablet (10 mg total) by mouth daily. 08/27/23   Raynelle Dick, NP  levETIRAcetam (KEPPRA) 500 MG tablet Take 1 tablet (500 mg total) by mouth 2 (two) times daily. 09/10/23    Lucky Cowboy, MD  magnesium gluconate (MAGONATE) 500 MG tablet Take 1 tablet (500 mg total) by mouth daily. 11/27/23   Lonia Blood, MD  montelukast (SINGULAIR) 10 MG tablet Take  1 tablet  Daily for Allergies                                                            /                            TAKE                      BY                     MOUTH Patient taking differently: Take 10 mg by mouth every morning. 05/08/23   Lucky Cowboy, MD      Allergies    Ace inhibitors, Calcium-containing compounds, and Polysporin [bacitracin-polymyxin b]    Review of Systems   Review of Systems  Unable to perform ROS: Acuity of condition    Physical Exam Updated Vital Signs BP 119/67   Pulse 89   Temp 98.1 F (36.7 C) (Oral)   Resp (!) 21   SpO2 95%  Physical Exam Vitals  and nursing note reviewed.  Constitutional:      General: She is not in acute distress. HENT:     Head: Normocephalic and atraumatic.  Eyes:     Conjunctiva/sclera: Conjunctivae normal.     Pupils: Pupils are equal, round, and reactive to light.  Cardiovascular:     Rate and Rhythm: Normal rate and regular rhythm.  Pulmonary:     Effort: Pulmonary effort is normal. No respiratory distress.  Abdominal:     General: There is no distension.     Tenderness: There is no guarding.  Musculoskeletal:        General: No deformity or signs of injury.     Cervical back: Neck supple.  Skin:    Findings: No lesion or rash.  Neurological:     Mental Status: She is alert.     Comments: Right gaze preference, left hemibody paresis, confused but protecting airway and alert.     ED Results / Procedures / Treatments   Labs (all labs ordered are listed, but only abnormal results are displayed) Labs Reviewed  CBC - Abnormal; Notable for the following components:      Result Value   RBC 3.79 (*)    Hemoglobin 10.9 (*)    HCT 34.5 (*)    All other components within normal limits  I-STAT CHEM 8, ED - Abnormal;  Notable for the following components:   Potassium 3.4 (*)    Glucose, Bld 101 (*)    Calcium, Ion 1.13 (*)    All other components within normal limits  I-STAT CHEM 8, ED - Abnormal; Notable for the following components:   Potassium 3.3 (*)    Glucose, Bld 113 (*)    Calcium, Ion 1.11 (*)    Hemoglobin 11.6 (*)    HCT 34.0 (*)    All other components within normal limits  DIFFERENTIAL  PROTIME-INR  APTT  COMPREHENSIVE METABOLIC PANEL  ETHANOL  CBG MONITORING, ED  I-STAT CG4 LACTIC ACID, ED    EKG None  Radiology CT ANGIO HEAD NECK W WO CM (CODE STROKE) Result Date: 01/15/2024 CLINICAL DATA:  ICH EXAM: CT ANGIOGRAPHY HEAD AND NECK WITH AND WITHOUT CONTRAST TECHNIQUE: Multidetector CT imaging of the head and neck was performed using the standard protocol during bolus administration of intravenous contrast. Multiplanar CT image reconstructions and MIPs were obtained to evaluate the vascular anatomy. Carotid stenosis measurements (when applicable) are obtained utilizing NASCET criteria, using the distal internal carotid diameter as the denominator. RADIATION DOSE REDUCTION: This exam was performed according to the departmental dose-optimization program which includes automated exposure control, adjustment of the mA and/or kV according to patient size and/or use of iterative reconstruction technique. CONTRAST:  75mL OMNIPAQUE IOHEXOL 350 MG/ML SOLN COMPARISON:  08/06/2023 FINDINGS: CTA NECK FINDINGS Aortic arch: Diffuse atheromatous plaque. Three vessel branching. No acute finding or dilatation Right carotid system: Mild atheromatous plaque mainly at the bifurcation. Mid ICA beading. No stenosis, ulceration, or dissection. Left carotid system: Atheromatous plaque primarily at the bifurcation. No stenosis, ulceration, or dissection. Vertebral arteries: Proximal subclavian atherosclerosis without flow reducing stenosis. The left vertebral artery is dominant. No vertebral stenosis or dissection.  Skeleton: Generalized cervical spine degeneration Other neck: No acute or aggressive finding Upper chest: Biapical emphysema peer Review of the MIP images confirms the above findings CTA HEAD FINDINGS Anterior circulation: Atheromatous plaque along the cavernous carotids without flow reducing stenosis. The left ICA is larger than the right in the setting of aplastic right A1 segment.  Ophthalmic segment right ICA aneurysm measuring 3.6 mm with smooth sac shape. No vascular lesion including aneurysm or malformation at the right frontal ICH, there is spot sign appearance along the lower hematoma. Posterior circulation: The left vertebral artery is dominant. Vertebral and basilar arteries are smoothly contoured and diffusely patent. No branch occlusion, beading, or aneurysm. Negative for vascular malformation Venous sinuses: Patent as permitted by contrast timing. Anatomic variants: As above Review of the MIP images confirms the above findings IMPRESSION: 1. No aneurysm or vascular malformation seen at the right frontal ICH. There is suggestion of spot sign at the lower hematoma however. 2. 3.6 mm right ICA aneurysm at the paraclinoid segment, unchanged. 3. Fibromuscular dysplasia seen in the right cervical ICA. 4. Atherosclerosis without flow reducing stenosis of major vessels. Electronically Signed   By: Tiburcio Pea M.D.   On: 01/15/2024 06:54   CT HEAD CODE STROKE WO CONTRAST Result Date: 01/15/2024 CLINICAL DATA:  Code stroke. EXAM: CT HEAD WITHOUT CONTRAST TECHNIQUE: Contiguous axial images were obtained from the base of the skull through the vertex without intravenous contrast. RADIATION DOSE REDUCTION: This exam was performed according to the departmental dose-optimization program which includes automated exposure control, adjustment of the mA and/or kV according to patient size and/or use of iterative reconstruction technique. COMPARISON:  12/28/2023 FINDINGS: Brain: Acute right parasagittal frontal  hematoma which measures 5.5 x 2.8 by 4.6 cm. There is intraventricular extension involving the right more than left lateral ventricles. No hydrocephalus or shift. Further regression of swelling at site of subacute hematoma in the left temporal lobe. Severe chronic Poche vessel disease in the cerebral white matter. Vascular: No hyperdense vessel or unexpected calcification. Skull: Normal. Negative for fracture or focal lesion. Sinuses/Orbits: No acute finding Other: As confirmed at 6:37 a.m., findings are already known to Dr. Amada Jupiter. IMPRESSION: 1. 35 cc parasagittal right frontal ICH with intraventricular extension. No hydrocephalus. 2. Advanced chronic Garabedian vessel disease. 3. Expected evolution at site of late subacute left temporal hematoma. Electronically Signed   By: Tiburcio Pea M.D.   On: 01/15/2024 06:39    Procedures .Critical Care  Performed by: Ernie Avena, MD Authorized by: Ernie Avena, MD   Critical care provider statement:    Critical care time (minutes):  45   Critical care was necessary to treat or prevent imminent or life-threatening deterioration of the following conditions:  CNS failure or compromise   Critical care was time spent personally by me on the following activities:  Development of treatment plan with patient or surrogate, discussions with consultants, evaluation of patient's response to treatment, examination of patient, ordering and review of laboratory studies, ordering and review of radiographic studies, ordering and performing treatments and interventions, pulse oximetry, re-evaluation of patient's condition and review of old charts   Care discussed with: admitting provider       Medications Ordered in ED Medications  clevidipine (CLEVIPREX) infusion 0.5 mg/mL (0 mg/hr Intravenous Paused 01/15/24 0710)   stroke: early stages of recovery book (has no administration in time range)  acetaminophen (TYLENOL) tablet 650 mg (has no administration in time  range)    Or  acetaminophen (TYLENOL) 160 MG/5ML solution 650 mg (has no administration in time range)    Or  acetaminophen (TYLENOL) suppository 650 mg (has no administration in time range)  senna-docusate (Senokot-S) tablet 1 tablet (has no administration in time range)  pantoprazole (PROTONIX) injection 40 mg (has no administration in time range)  levETIRAcetam (KEPPRA) tablet 500 mg (has no administration  in time range)  montelukast (SINGULAIR) tablet 10 mg (has no administration in time range)  sodium chloride flush (NS) 0.9 % injection 3 mL (3 mLs Intravenous Given 01/15/24 0646)  iohexol (OMNIPAQUE) 350 MG/ML injection 75 mL (75 mLs Intravenous Contrast Given 01/15/24 1610)    ED Course/ Medical Decision Making/ A&P                                 Medical Decision Making Amount and/or Complexity of Data Reviewed Labs: ordered. Radiology: ordered.  Risk Decision regarding hospitalization.    89 year old female with medical history significant for HTN, HLD, COPD, prior lung cancer status post lobectomy, right frontal cortex traumatic IPH in June 2024, left temporal ICH of unclear etiology in September 2024 presenting to the emergency department as a code stroke.  Patient's last known normal was 2000 last night.  She was found to have right-sided gaze, left arm weakness and a left-sided facial droop.  On arrival, the patient was cleared at the bridge and taken to the CT scanner for code stroke imaging and found to have right frontal ICH with ventricular extension.  Vitals on arrival afebrile, not tachycardic, hypertensive BP 189/100, saturating 95% on room air.  Patient with a right gaze preference, left hemibody deficits.  Neurology bedside and in contact with the patient's family and confirmed that the patient is a DNR at this time.  Cleviprex ordered for blood pressure control.  Patient subsequently admitted to the ICU under neurology in stable condition, blood pressure had improved  on Cleviprex at time of admission.    Final Clinical Impression(s) / ED Diagnoses Final diagnoses:  Nontraumatic intracerebral hemorrhage, unspecified cerebral location, unspecified laterality [facility])    Rx / DC Orders ED Discharge Orders     None         Ernie Avena, MD 01/15/24 [phone]

## 2024-01-15 NOTE — Progress Notes (Addendum)
STROKE TEAM PROGRESS NOTE    SIGNIFICANT HOSPITAL EVENTS 2/11 patient admitted with right thalamic ICH ICH score 2 2/12 goals of care discussion had with family and decision made to transition patient to inpatient hospice  INTERIM HISTORY/SUBJECTIVE Patient has intermittently required Cleviprex overnight for blood pressure control.  Given previous level of function and quality of life as well as new disabilities expected with this ICH, goals of care discussion was had with patient's children.  Decision was made to discharge patient to inpatient hospice.  CODE STATUS was changed to DNR/DNI per agreement with family.  Current plan is to discharge to [facility] for hospice care when bed available.  OBJECTIVE  CBC    Component Value Date/Time   WBC 4.8 01/15/2024 0655   RBC 3.79 (L) 01/15/2024 0655   HGB 10.9 (L) 01/15/2024 0655   HGB 11.6 (L) 01/15/2024 0655   HCT 34.5 (L) 01/15/2024 0655   HCT 34.0 (L) 01/15/2024 0655   PLT 230 01/15/2024 0655   MCV 91.0 01/15/2024 0655   MCH 28.8 01/15/2024 0655   MCHC 31.6 01/15/2024 0655   RDW 14.8 01/15/2024 0655   LYMPHSABS 1.2 01/15/2024 0655   MONOABS 0.4 01/15/2024 0655   EOSABS 0.1 01/15/2024 0655   BASOSABS 0.0 01/15/2024 0655    BMET    Component Value Date/Time   NA 141 01/15/2024 0655   NA 140 01/15/2024 0655   K 3.3 (L) 01/15/2024 0655   K 3.3 (L) 01/15/2024 0655   CL 105 01/15/2024 0655   CL 102 01/15/2024 0655   CO2 27 01/15/2024 0655   GLUCOSE 120 (H) 01/15/2024 0655   GLUCOSE 113 (H) 01/15/2024 0655   BUN 11 01/15/2024 0655   BUN 11 01/15/2024 0655   CREATININE 0.64 01/15/2024 0655   CREATININE 0.60 01/15/2024 0655   CREATININE 0.67 10/29/2023 1113   CALCIUM 8.8 (L) 01/15/2024 0655   EGFR 84 10/29/2023 1113   GFRNONAA >60 01/15/2024 0655   GFRNONAA 59 (L) 02/07/2021 1450    IMAGING past 24 hours CT ANGIO HEAD NECK W WO CM (CODE STROKE) Result Date: 01/15/2024 CLINICAL DATA:  ICH EXAM: CT ANGIOGRAPHY HEAD AND  NECK WITH AND WITHOUT CONTRAST TECHNIQUE: Multidetector CT imaging of the head and neck was performed using the standard protocol during bolus administration of intravenous contrast. Multiplanar CT image reconstructions and MIPs were obtained to evaluate the vascular anatomy. Carotid stenosis measurements (when applicable) are obtained utilizing NASCET criteria, using the distal internal carotid diameter as the denominator. RADIATION DOSE REDUCTION: This exam was performed according to the departmental dose-optimization program which includes automated exposure control, adjustment of the mA and/or kV according to patient size and/or use of iterative reconstruction technique. CONTRAST:  75mL OMNIPAQUE IOHEXOL 350 MG/ML SOLN COMPARISON:  08/06/2023 FINDINGS: CTA NECK FINDINGS Aortic arch: Diffuse atheromatous plaque. Three vessel branching. No acute finding or dilatation Right carotid system: Mild atheromatous plaque mainly at the bifurcation. Mid ICA beading. No stenosis, ulceration, or dissection. Left carotid system: Atheromatous plaque primarily at the bifurcation. No stenosis, ulceration, or dissection. Vertebral arteries: Proximal subclavian atherosclerosis without flow reducing stenosis. The left vertebral artery is dominant. No vertebral stenosis or dissection. Skeleton: Generalized cervical spine degeneration Other neck: No acute or aggressive finding Upper chest: Biapical emphysema peer Review of the MIP images confirms the above findings CTA HEAD FINDINGS Anterior circulation: Atheromatous plaque along the cavernous carotids without flow reducing stenosis. The left ICA is larger than the right in the setting of aplastic right A1  segment. Ophthalmic segment right ICA aneurysm measuring 3.6 mm with smooth sac shape. No vascular lesion including aneurysm or malformation at the right frontal ICH, there is spot sign appearance along the lower hematoma. Posterior circulation: The left vertebral artery is  dominant. Vertebral and basilar arteries are smoothly contoured and diffusely patent. No branch occlusion, beading, or aneurysm. Negative for vascular malformation Venous sinuses: Patent as permitted by contrast timing. Anatomic variants: As above Review of the MIP images confirms the above findings IMPRESSION: 1. No aneurysm or vascular malformation seen at the right frontal ICH. There is suggestion of spot sign at the lower hematoma however. 2. 3.6 mm right ICA aneurysm at the paraclinoid segment, unchanged. 3. Fibromuscular dysplasia seen in the right cervical ICA. 4. Atherosclerosis without flow reducing stenosis of major vessels. Electronically Signed   By: Tiburcio Pea M.D.   On: 01/15/2024 06:54   CT HEAD CODE STROKE WO CONTRAST Result Date: 01/15/2024 CLINICAL DATA:  Code stroke. EXAM: CT HEAD WITHOUT CONTRAST TECHNIQUE: Contiguous axial images were obtained from the base of the skull through the vertex without intravenous contrast. RADIATION DOSE REDUCTION: This exam was performed according to the departmental dose-optimization program which includes automated exposure control, adjustment of the mA and/or kV according to patient size and/or use of iterative reconstruction technique. COMPARISON:  12/28/2023 FINDINGS: Brain: Acute right parasagittal frontal hematoma which measures 5.5 x 2.8 by 4.6 cm. There is intraventricular extension involving the right more than left lateral ventricles. No hydrocephalus or shift. Further regression of swelling at site of subacute hematoma in the left temporal lobe. Severe chronic Lanzer vessel disease in the cerebral white matter. Vascular: No hyperdense vessel or unexpected calcification. Skull: Normal. Negative for fracture or focal lesion. Sinuses/Orbits: No acute finding Other: As confirmed at 6:37 a.m., findings are already known to Dr. Amada Jupiter. IMPRESSION: 1. 35 cc parasagittal right frontal ICH with intraventricular extension. No hydrocephalus. 2. Advanced  chronic Shankle vessel disease. 3. Expected evolution at site of late subacute left temporal hematoma. Electronically Signed   By: Tiburcio Pea M.D.   On: 01/15/2024 06:39    Vitals:   01/15/24 1030 01/15/24 1100 01/15/24 1115 01/15/24 1130  BP: 134/86 (!) 135/97 (!) 150/89   Pulse: (!) 110 (!) 114 (!) 117 (!) 114  Resp: (!) 25 (!) 27 (!) 26 (!) 23  Temp:  98.5 F (36.9 C)    TempSrc:  Axillary    SpO2: (!) 88% 90% 90% 90%  Weight:         PHYSICAL EXAM General: Frail-appearing elderly patient in no acute distress Psych:  Mood and affect appropriate for situation CV: Regular rate and rhythm on monitor Respiratory:  Regular, unlabored respirations on room air  NEURO:  Mental Status: Alert and oriented to person and place, disoriented to time and situation, able to recognize family members Speech/Language: speech is with some dysarthria  Cranial Nerves:  II: PERRL.  III, IV, VI: EOMI. Eyelids elevate symmetrically.  V: Sensation is intact to light touch and symmetrical to face.  VII: Left facial droop VIII: hearing intact to voice. IX, X: Voice is dysarthric XII: tongue is midline without fasciculations. Motor: Able to move right upper and lower extremities with antigravity strength, does not move left upper extremity, able to move left lower extremity but does not break gravity Tone: is normal and bulk is normal Sensation- Intact to light touch on the right, left-sided neglect present Coordination: Unable to perform Gait- deferred  Most Recent NIH  1a  Level of Conscious.: 1 1b LOC Questions: 2 1c LOC Commands: 0 2 Best Gaze: 0 3 Visual: 0 4 Facial Palsy: 1 5a Motor Arm - left: 4 5b Motor Arm - Right: 0 6a Motor Leg - Left: 3 6b Motor Leg - Right: 0 7 Limb Ataxia: 0 8 Sensory: 2 9 Best Language: 0 10 Dysarthria: 1 11 Extinct. and Inatten.: 2 TOTAL: 16   ASSESSMENT/PLAN  Penny [name] is a 89 y.o. female with history of cerebral amyloid angiopathy  and recent ICH admitted for recurrent right-sided cortically-based ICH.  Patient had been discharged from a rehabilitation facility about 3 weeks ago per family and had been doing relatively well.  However, on exam today, she exhibits severe left-sided deficits and neglect.  Given significant disability and likely poor quality of life, discussion was had with patient's family regarding goals of care, and decision was made to transition patient to inpatient hospice.  NIH on Admission 16  Intracerebral Hemorrhage:  right frontal ICH with IVH Etiology: Cerebral amyloid angiopathy Code Stroke CT head 35 cc parasagittal right frontal ICH with IVH, no hydrocephalus LDL 182 HgbA1c 5.9 Continue Keppra for seizure prophylaxis Continue morphine, haloperidol, lorazepam and oxycodone as needed for comfort VTE prophylaxis -SCDs aspirin 81 mg daily prior to admission, now on No antithrombotic secondary to ICH Disposition: Inpatient hosp[facility] at [facility]  Hx of Stroke/TIA Patient has cerebral amyloid angiopathy and had another ICH in 12/24  Hypertension Home meds: None No blood pressure goal at this time, patient on comfort care  Dysphagia Patient has post-stroke dysphagia, SLP consulted    Diet   DIET DYS 3 Room service appropriate? Yes; Fluid consistency: Thin   Advance diet as tolerated  Other Stroke Risk Factors None   Other Active Problems None  Hospital day # 0  Patient seen by NP with MD, MD to edit note as needed. Cortney E Ernestina Columbia , MSN, AGACNP-BC [facility] See Amion for schedule and pager information 01/15/2024 2:06 PM   STROKE MD NOTE :  I have personally obtained history,examined this patient, reviewed notes, independently viewed imaging studies, participated in medical decision making and plan of care.ROS completed by me personally and pertinent positives fully documented  I have made any additions or clarifications directly to the above note. Agree with  note above.  89 year old lady with recent left temporal lobar hemorrhage and December 2024 with suspected cerebral amyloid angiopathy presents with sudden onset of left hemiplegia with large right frontal parasagittal hemorrhage and on exam has significant vascular dementia and dense left hemiplegia.  Prognosis for making significant improvement and being able to live independently is quite bleak.  Long discussion at the bedside with the patient's daughter and son about the prognosis and goals of care.  Family was struggling to provide care for her given prior to this present hemorrhage and stay agreed to DNR and comfort care measures.  Will contact case manager to hopefully move the patient to [facility] for hospice services in the next few days.This patient is critically ill and at significant risk of neurological worsening, death and care requires constant monitoring of vital signs, hemodynamics,respiratory and cardiac monitoring, extensive review of multiple databases, frequent neurological assessment, discussion with family, other specialists and medical decision making of high complexity.I have made any additions or clarifications directly to the above note.This critical care time does not reflect procedure time, or teaching time or supervisory time of PA/NP/Med Resident etc but could involve care discussion time.  I spent 30 minutes of neurocritical care time  in the care of  this patient.      Delia Heady, MD Medical Director [facility] Pager: [phone] 01/15/2024 2:16 PM  To contact Stroke Continuity provider, please refer to [email or web address]. After hours, contact General Neurology

## 2024-01-15 NOTE — H&P (Signed)
Neurology H&P  CC: left sided weakness  History is obtained from:   HPI: Penny [name] is a 89 y.o. female with a history of cerebral amyloid angiopathy and multiple recent intracranial hemorrhages who presents with intracranial hemorrhage.  She was last in her normal state of health at 8 PM when she went to bed presumably, she was still doing reasonably well because she got up to go to the bathroom twice in the middle of the night, but she was not witnessed to do this.  This morning, she was calling out on her daughter's name, and her daughter went to check on her and found her difficulty get up and therefore called 911.   LKW: 8 PM tpa given?: No, ICH IR Thrombectomy? No, ICH Modified Rankin Scale: 4-Needs assistance to walk and tend to bodily needs NIHSS: 16 ICH score: 2   Past Medical History:  Diagnosis Date   COPD (chronic obstructive pulmonary disease) with emphysema (HCC)    COVID-19 12/09/2019   Hyperlipidemia    Hypertension    Lung cancer, upper lobe (HCC) 12/04/1996   RUL   Osteoporosis    S/P partial lobectomy of lung    [id number]   Thigh shingles 12/04/1996   Thoracic aorta atherosclerosis (HCC)    Vitamin D deficiency      Family History  Problem Relation Age of Onset   Heart disease Mother    Lung cancer Father    Breast cancer Paternal Aunt      Social History:  reports that she quit smoking about 27 years ago. Her smoking use included cigarettes. She has never used smokeless tobacco. She reports current alcohol use of about 1.0 standard drink of alcohol per week. She reports that she does not use drugs.   Prior to Admission medications   Medication Sig Start Date End Date Taking? Authorizing Provider  acetaminophen (TYLENOL) 325 MG tablet Take 650 mg by mouth as needed for mild pain or moderate pain.    [provider]  atorvastatin (LIPITOR) 10 MG tablet Take 1 tablet (10 mg total) by mouth daily. 11/28/23   Lonia Blood, MD   buPROPion (WELLBUTRIN) 75 MG tablet TAKE 1 TABLET BY MOUTH DAILY 12/31/23   Adela Glimpse, NP  Cholecalciferol 125 MCG (5000 UT) capsule Take 1 capsules Daily Patient taking differently: Take 5,000 Units by mouth daily. 12/01/21   Judd Gaudier, NP  Cyanocobalamin (VITAMIN B-12) 1000 MCG SUBL Takes 1 tab SL Daily Patient taking differently: Take 1 tablet by mouth daily. 05/02/19   Lucky Cowboy, MD  ezetimibe (ZETIA) 10 MG tablet Take 1 tablet (10 mg total) by mouth daily. 08/27/23   Raynelle Dick, NP  levETIRAcetam (KEPPRA) 500 MG tablet Take 1 tablet (500 mg total) by mouth 2 (two) times daily. 09/10/23   Lucky Cowboy, MD  magnesium gluconate (MAGONATE) 500 MG tablet Take 1 tablet (500 mg total) by mouth daily. 11/27/23   Lonia Blood, MD  montelukast (SINGULAIR) 10 MG tablet Take  1 tablet  Daily for Allergies                                                            /  TAKE                      BY                     MOUTH Patient taking differently: Take 10 mg by mouth every morning. 05/08/23   Lucky Cowboy, MD     Exam: Current vital signs: BP 111/87   Pulse 91   Temp 98.1 F (36.7 C) (Oral)   Resp 16   SpO2 100%    Physical Exam  Constitutional: Appears well-developed and well-nourished.  Psych: Affect appropriate to situation Eyes: No scleral injection HENT: No OP obstrucion Head: Normocephalic.  Cardiovascular: Normal rate and regular rhythm.  Respiratory: Effort normal and breath sounds normal to anterior ascultation GI: Soft.  No distension. There is no tenderness.  Skin: WDI  Neuro: Mental Status: Patient is awake, alert, oriented gives age of month She has a dense left hemineglect Cranial Nerves: II: She has a left hemianopia. Pupils are equal, round, and reactive to light.   III,IV, VI: She has a right gaze preference but is able to cross midline to the left V: Facial sensation is diminished on left VII: Facial  movement with left facial weakness Motor: She has a flaccid paralysis of the entire left side, moves right side well sensory: She does not appreciate touch to the left Cerebellar: No clear ataxia in the right  I have reviewed labs in epic and the pertinent results are: Results for orders placed or performed during the hospital encounter of 01/15/24 (from the past 24 hours)  CBG monitoring, ED     Status: None   Collection Time: 01/15/24  6:25 AM  Result Value Ref Range   Glucose-Capillary 99 70 - 99 mg/dL  I-stat chem 8, ED     Status: Abnormal   Collection Time: 01/15/24  6:30 AM  Result Value Ref Range   Sodium 143 135 - 145 mmol/L   Potassium 3.4 (L) 3.5 - 5.1 mmol/L   Chloride 106 98 - 111 mmol/L   BUN 13 8 - 23 mg/dL   Creatinine, Ser 4.09 0.44 - 1.00 mg/dL   Glucose, Bld 811 (H) 70 - 99 mg/dL   Calcium, Ion 9.14 (L) 1.15 - 1.40 mmol/L   TCO2 28 22 - 32 mmol/L   Hemoglobin 12.6 12.0 - 15.0 g/dL   HCT 78.2 95.6 - 21.3 %  I-Stat CG4 Lactic Acid, ED     Status: None   Collection Time: 01/15/24  6:34 AM  Result Value Ref Range   Lactic Acid, Venous 1.5 0.5 - 1.9 mmol/L  Protime-INR     Status: None   Collection Time: 01/15/24  6:55 AM  Result Value Ref Range   Prothrombin Time 13.4 11.4 - 15.2 seconds   INR 1.0 0.8 - 1.2  APTT     Status: None   Collection Time: 01/15/24  6:55 AM  Result Value Ref Range   aPTT 26 24 - 36 seconds  CBC     Status: Abnormal   Collection Time: 01/15/24  6:55 AM  Result Value Ref Range   WBC 4.8 4.0 - 10.5 K/uL   RBC 3.79 (L) 3.87 - 5.11 MIL/uL   Hemoglobin 10.9 (L) 12.0 - 15.0 g/dL   HCT 08.6 (L) 57.8 - 46.9 %   MCV 91.0 80.0 - 100.0 fL   MCH 28.8 26.0 - 34.0 pg   MCHC 31.6 30.0 - 36.0 g/dL  RDW 14.8 11.5 - 15.5 %   Platelets 230 150 - 400 K/uL   nRBC 0.0 0.0 - 0.2 %  Differential     Status: None   Collection Time: 01/15/24  6:55 AM  Result Value Ref Range   Neutrophils Relative % 64 %   Neutro Abs 3.1 1.7 - 7.7 K/uL    Lymphocytes Relative 24 %   Lymphs Abs 1.2 0.7 - 4.0 K/uL   Monocytes Relative 9 %   Monocytes Absolute 0.4 0.1 - 1.0 K/uL   Eosinophils Relative 2 %   Eosinophils Absolute 0.1 0.0 - 0.5 K/uL   Basophils Relative 0 %   Basophils Absolute 0.0 0.0 - 0.1 K/uL   Immature Granulocytes 1 %   Abs Immature Granulocytes 0.03 0.00 - 0.07 K/uL  I-stat chem 8, ed     Status: Abnormal   Collection Time: 01/15/24  6:55 AM  Result Value Ref Range   Sodium 140 135 - 145 mmol/L   Potassium 3.3 (L) 3.5 - 5.1 mmol/L   Chloride 102 98 - 111 mmol/L   BUN 11 8 - 23 mg/dL   Creatinine, Ser 1.61 0.44 - 1.00 mg/dL   Glucose, Bld 096 (H) 70 - 99 mg/dL   Calcium, Ion 0.45 (L) 1.15 - 1.40 mmol/L   TCO2 28 22 - 32 mmol/L   Hemoglobin 11.6 (L) 12.0 - 15.0 g/dL   HCT 40.9 (L) 81.1 - 91.4 %     I have reviewed the images obtained: CT head-moderate-sized right hemispheric cortically based hemorrhage  Primary Diagnosis:  Nontraumatic intracerebral hemorrhage in hemisphere, cortical  Secondary Diagnosis: Essential (primary) hypertension and Hypertension Emergency (SBP > 180 or DBP > 120 & end organ damage) hypokalemia   Impression: 89 year old female with a history of cerebral amyloid angiopathy who presents with recurrent intracranial hemorrhage.  My suspicion based on the location is that this is due to her cerebral amyloid angiopathy.  She will need admission to the ICU for aggressive blood pressure management, and she is going to need an increased level of care likely prior to going home.  I discussed CODE STATUS with her daughter and she is DNR/DNI.  Also of note, she is on both Keppra as well as bupropion, I would favor not continuing bupropion in the setting of concern for seizures.  Plan: 1) Admit to ICU 2) no antiplatelets or anticoagulants 3) blood pressure control with goal systolic 130 - 150 4) Frequent neuro checks 5) If symptoms worsen or there is decreased mental status, repeat stat head  CT 6) PT,OT,ST 7) she is DNR/DNI 8) discontinue bupropion  This patient is critically ill and at significant risk of neurological worsening, death and care requires constant monitoring of vital signs, hemodynamics,respiratory and cardiac monitoring, neurological assessment, discussion with family, other specialists and medical decision making of high complexity. I spent 60 minutes of neurocritical care time  in the care of  this patient. This was time spent independent of any time provided by nurse practitioner or PA.  Ritta Slot, MD [facility] [phone]  If 7pm- 7am, please page neurology on call as listed in AMION. 01/15/2024  7:22 AM

## 2024-01-15 NOTE — ED Triage Notes (Signed)
Patient BIB [facility] from home as a code stroke. LKW 2000 last night. Patient has right sided gaze, L arm weakness, and left sided facial droop. Hx of previous strokes. Patient alert upon arrival, not oriented.

## 2024-01-15 NOTE — TOC Initial Note (Signed)
Transition of Care [facility]) - Initial/Assessment Note    Patient Details  Name: Penny [name] MRN: [id number] Date of Birth: [birth year]  Transition of Care [facility]) CM/SW Contact:    Mearl Latin, LCSW Phone Number: 01/15/2024, 12:34 PM  Clinical Narrative:                 CSW received consult for facility hospice referral. CSW spoke with patient's daughter and provided choice. She requested [facility] as it is in [place]. CSW sent referral to [facility] for review.   Expected Discharge Plan: Hospice Medical Facility Barriers to Discharge: Hospice Bed not available   Patient Goals and CMS Choice Patient states their goals for this hospitalization and ongoing recovery are:: Comfort CMS Medicare.gov Compare Post Acute Care list provided to:: Patient Represenative (must comment) Choice offered to / list presented to : Adult Children [place] ownership interest in [facility].provided to:: Adult Children    Expected Discharge Plan and Services In-house Referral: Clinical Social Work, Hospice / Palliative Care   Post Acute Care Choice: Hospice Living arrangements for the past 2 months: Single Family Home                                      Prior Living Arrangements/Services Living arrangements for the past 2 months: Single Family Home   Patient language and need for interpreter reviewed:: Yes Do you feel safe going back to the place where you live?: Yes      Need for Family Participation in Patient Care: Yes (Comment) Care giver support system in place?: Yes (comment) Current home services: DME Criminal Activity/Legal Involvement Pertinent to Current Situation/Hospitalization: No - Comment as needed  Activities of Daily Living      Permission Sought/Granted Permission sought to share information with : Facility Medical sales representative, Family Supports Permission granted to share information with : No  Share Information with NAME:  Dava Najjar  Permission granted to share info w AGENCY: Hospice  Permission granted to share info w Relationship: Daughter  Permission granted to share info w Contact Information: [phone]  Emotional Assessment Appearance:: Appears stated age Attitude/Demeanor/Rapport: Unable to Assess Affect (typically observed): Unable to Assess Orientation: : Oriented to Self Alcohol / Substance Use: Not Applicable Psych Involvement: No (comment)  Admission diagnosis:  ICH (intracerebral hemorrhage) (HCC) [I61.9] Nontraumatic intracerebral hemorrhage, unspecified cerebral location, unspecified laterality [facility]) [I61.9] Patient Active Problem List   Diagnosis Date Noted   Seizure (HCC) 11/22/2023   White matter disease, unspecified 08/20/2023   ICH (intracerebral hemorrhage) (HCC) 08/06/2023   Hemorrhagic stroke (HCC) 08/06/2023   Intracranial hemorrhage (HCC) 06/01/2023   Closed fracture of left proximal humerus 06/01/2023   Fall at home, initial encounter 06/01/2023   History of COPD 06/01/2023   Allergic rhinitis 06/01/2023   Depression 06/01/2023   Intraparenchymal hematoma of brain (HCC) 06/01/2023   Closed fracture of shaft of left humerus 06/01/2023   Rigidity (muscles) 05/21/2023   Traumatic head injury less than 3 months ago 05/21/2023   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   Malignant neoplasm of upper lobe of lung, unspecified laterality (HCC) 01/07/2023   Syncope    Symptomatic sinus bradycardia 09/10/2022   Hypokalemia 09/10/2022   Hypomagnesemia 09/10/2022   CAD (coronary artery disease) 09/10/2022   COVID-19 (11/30/2021) 12/01/2021   B12 deficiency 06/28/2020   Abnormal glucose 10/09/2018   Osteoporosis 12/28/2015   COPD (chronic obstructive pulmonary disease)  with emphysema (HCC) 02/25/2015   Atherosclerosis of aorta (HCC) BY ct SCAN IN 2015 02/25/2015   Essential hypertension    Hyperlipidemia, mixed    Vitamin D deficiency    Personal history of lung cancer 12/04/1996    PCP:  Lucky Cowboy, MD Pharmacy:   [facility] [id number] [place], [place] - [address] [place] [address] Phone: [phone] Fax: [phone]     Social Drivers of Health (SDOH) Social History: SDOH Screenings   Food Insecurity: No Food Insecurity (11/23/2023)  Housing: Low Risk  (11/23/2023)  Transportation Needs: No Transportation Needs (11/23/2023)  Utilities: Not At Risk (11/23/2023)  Depression (PHQ2-9): Medium Risk (05/14/2023)  Tobacco Use: Medium Risk (12/28/2023)   SDOH Interventions:     Readmission Risk Interventions     No data to display

## 2024-01-16 DIAGNOSIS — I619 Nontraumatic intracerebral hemorrhage, unspecified: Secondary | ICD-10-CM | POA: Diagnosis not present

## 2024-01-16 LAB — BASIC METABOLIC PANEL
Anion gap: 11 (ref 5–15)
BUN: 15 mg/dL (ref 8–23)
CO2: 26 mmol/L (ref 22–32)
Calcium: 9.2 mg/dL (ref 8.9–10.3)
Chloride: 100 mmol/L (ref 98–111)
Creatinine, Ser: 0.64 mg/dL (ref 0.44–1.00)
GFR, Estimated: >60 mL/min (ref >60)
Glucose, Bld: 161 mg/dL — ABNORMAL HIGH (ref 70–99)
Potassium: 3.7 mmol/L (ref 3.5–5.1)
Sodium: 137 mmol/L (ref 135–145)

## 2024-01-16 LAB — MAGNESIUM: Magnesium: 1.6 mg/dL — ABNORMAL LOW (ref 1.7–2.4)

## 2024-01-16 LAB — PHOSPHORUS: Phosphorus: 3.1 mg/dL (ref 2.5–4.6)

## 2024-01-16 NOTE — Progress Notes (Signed)
[facility] Liaison Note   There are still no routine beds available at [facility] at this time.    Hospital liaison will follow up tomorrow or sooner if a room becomes available.    Please do not hesitate to call with questions.    Henderson Newcomer, LPN [facility]ospice Liaison [phone]

## 2024-01-16 NOTE — TOC Progression Note (Signed)
Transition of Care [facility]) - Progression Note    Patient Details  Name: Penny [name] MRN: [id number] Date of Birth: [birth year]  Transition of Care [facility]) CM/SW Contact  Marliss Coots, LCSW Phone Number: 01/16/2024, 3:56 PM  Clinical Narrative:     3:56 PM Patient has been approved for residential hospice at [facility] and is awaiting bed availability.  Expected Discharge Plan: Hospice Medical Facility Barriers to Discharge: Hospice Bed not available  Expected Discharge Plan and Services In-house Referral: Clinical Social Work, Hospice / Palliative Care   Post Acute Care Choice: Hospice Living arrangements for the past 2 months: Single Family Home                                       Social Determinants of Health (SDOH) Interventions SDOH Screenings   Food Insecurity: No Food Insecurity (11/23/2023)  Housing: Low Risk  (11/23/2023)  Transportation Needs: No Transportation Needs (11/23/2023)  Utilities: Not At Risk (11/23/2023)  Depression (PHQ2-9): Medium Risk (05/14/2023)  Tobacco Use: Medium Risk (12/28/2023)    Readmission Risk Interventions     No data to display

## 2024-01-16 NOTE — Progress Notes (Addendum)
STROKE TEAM PROGRESS NOTE    SIGNIFICANT HOSPITAL EVENTS 2/11 patient admitted with right thalamic ICH ICH score 2 2/12 goals of care discussion had with family and decision made to transition patient to inpatient hospice  INTERIM HISTORY/SUBJECTIVE Patient has intermittently required Cleviprex overnight for blood pressure control.  Given previous level of function and quality of life as well as new disabilities expected with this ICH, goals of care discussion was had with patient's children.  Decision was made to discharge patient to inpatient hospice.  CODE STATUS was changed to DNR/DNI per agreement with family.  Current plan is to discharge to [facility] for hospice care when bed available. At this time no beds available, will transfer to palliative floor later today  OBJECTIVE  CBC    Component Value Date/Time   WBC 4.8 01/15/2024 0655   RBC 3.79 (L) 01/15/2024 0655   HGB 10.9 (L) 01/15/2024 0655   HGB 11.6 (L) 01/15/2024 0655   HCT 34.5 (L) 01/15/2024 0655   HCT 34.0 (L) 01/15/2024 0655   PLT 230 01/15/2024 0655   MCV 91.0 01/15/2024 0655   MCH 28.8 01/15/2024 0655   MCHC 31.6 01/15/2024 0655   RDW 14.8 01/15/2024 0655   LYMPHSABS 1.2 01/15/2024 0655   MONOABS 0.4 01/15/2024 0655   EOSABS 0.1 01/15/2024 0655   BASOSABS 0.0 01/15/2024 0655    BMET    Component Value Date/Time   NA 137 01/16/2024 0926   K 3.7 01/16/2024 0926   CL 100 01/16/2024 0926   CO2 26 01/16/2024 0926   GLUCOSE 161 (H) 01/16/2024 0926   BUN 15 01/16/2024 0926   CREATININE 0.64 01/16/2024 0926   CREATININE 0.67 10/29/2023 1113   CALCIUM 9.2 01/16/2024 0926   EGFR 84 10/29/2023 1113   GFRNONAA >60 01/16/2024 0926   GFRNONAA 59 (L) 02/07/2021 1450    IMAGING past 24 hours No results found.   Vitals:   01/16/24 0800 01/16/24 0900 01/16/24 1000 01/16/24 1100  BP: (!) 157/89 (!) 148/94 (!) 163/96 (!) 163/99  Pulse: (!) 114 (!) 114 (!) 124 (!) 121  Resp: (!) 24  (!) 23 (!) 24  Temp:       TempSrc:      SpO2: 91% 90% 92% 92%  Weight:         PHYSICAL EXAM General: Frail-appearing elderly patient in no acute distress Psych:  Mood and affect appropriate for situation CV: Regular rate and rhythm on monitor Respiratory:  Regular, unlabored respirations on room air  NEURO:  Mental Status:Eyes open, not tracking as much today. Appears comfortable Speech/Language: No verbal output  Cranial Nerves:  II: PERRL.  III, IV, VI: EOMI. Eyelids elevate symmetrically.  V: Sensation is intact to light touch and symmetrical to face.  VII: Left facial droop VIII: hearing intact to voice. IX, X: Voice is dysarthric XII: tongue is midline without fasciculations. Motor: Able to move right upper and lower extremities with antigravity strength, does not move left upper extremity, able to move left lower extremity but does not break gravity Tone: is normal and bulk is normal Sensation- Intact to light touch on the right, left-sided neglect present Coordination: Unable to perform Gait- deferred  ASSESSMENT/PLAN  Penny [name] is a 89 y.o. female with history of cerebral amyloid angiopathy and recent ICH admitted for recurrent right-sided cortically-based ICH.  Patient had been discharged from a rehabilitation facility about 3 weeks ago per family and had been doing relatively well.  However, on exam today,  she exhibits severe left-sided deficits and neglect.  Given significant disability and likely poor quality of life, discussion was had with patient's family regarding goals of care, and decision was made to transition patient to inpatient hospice.  NIH on Admission 16  Intracerebral Hemorrhage:  right frontal ICH with IVH Etiology: Cerebral amyloid angiopathy Code Stroke CT head 35 cc parasagittal right frontal ICH with IVH, no hydrocephalus LDL 182 HgbA1c 5.9 Continue Keppra for seizure prophylaxis Continue morphine, haloperidol, lorazepam and oxycodone as needed for  comfort VTE prophylaxis -SCDs aspirin 81 mg daily prior to admission, now on No antithrombotic secondary to ICH Disposition: Inpatient hospice at [facility]  Hx of Stroke/TIA Patient has cerebral amyloid angiopathy and had another ICH in 12/24  Hypertension Home meds: None No blood pressure goal at this time, patient on comfort care  Dysphagia Patient has post-stroke dysphagia, SLP consulted    Diet   DIET DYS 3 Room service appropriate? Yes; Fluid consistency: Thin   Advance diet as tolerated  Other Stroke Risk Factors None   Other Active Problems None  Hospital day # 1  Patient seen and examined by NP/APP with MD. MD to update note as needed.   Elmer Picker, DNP, FNP-BC [facility] Pager: [phone]  I have personally obtained history,examined this patient, reviewed notes, independently viewed imaging studies, participated in medical decision making and plan of care.ROS completed by me personally and pertinent positives fully documented  I have made any additions or clarifications directly to the above note. Agree with note above.  Patient medically stable to be transferred to hospice bed at [facility] when available.  Discussed with son and daughter at the bedside and answered questions.  Greater than 50% time during this 35-minute visit was spent in counseling and coordination of care and discussion with patient and care team and answering questions.  Delia Heady, MD Medical Director [facility] Pager: [phone] 01/16/2024 5:28 PM   To contact Stroke Continuity provider, please refer to [email or web address]. After hours, contact General Neurology

## 2024-01-16 NOTE — Plan of Care (Signed)
  Problem: Education: Goal: Knowledge of disease or condition will improve Outcome: Not Progressing Goal: Knowledge of secondary prevention will improve (MUST DOCUMENT ALL) Outcome: Not Progressing Goal: Knowledge of patient specific risk factors will improve (DELETE if not current risk factor) Outcome: Not Progressing   Problem: Intracerebral Hemorrhage Tissue Perfusion: Goal: Complications of Intracerebral Hemorrhage will be minimized Outcome: Not Progressing   Problem: Coping: Goal: Will verbalize positive feelings about self Outcome: Not Progressing Goal: Will identify appropriate support needs Outcome: Not Progressing   Problem: Health Behavior/Discharge Planning: Goal: Ability to manage health-related needs will improve Outcome: Not Progressing Goal: Goals will be collaboratively established with patient/family Outcome: Not Progressing   Problem: Self-Care: Goal: Ability to participate in self-care as condition permits will improve Outcome: Not Progressing Goal: Verbalization of feelings and concerns over difficulty with self-care will improve Outcome: Not Progressing Goal: Ability to communicate needs accurately will improve Outcome: Not Progressing   Problem: Nutrition: Goal: Risk of aspiration will decrease Outcome: Not Progressing Goal: Dietary intake will improve Outcome: Not Progressing   Problem: Education: Goal: Knowledge of General Education information will improve Description: Including pain rating scale, medication(s)/side effects and non-pharmacologic comfort measures Outcome: Not Progressing   Problem: Health Behavior/Discharge Planning: Goal: Ability to manage health-related needs will improve Outcome: Not Progressing   Problem: Clinical Measurements: Goal: Ability to maintain clinical measurements within normal limits will improve Outcome: Not Progressing Goal: Will remain free from infection Outcome: Not Progressing Goal: Diagnostic test  results will improve Outcome: Not Progressing Goal: Respiratory complications will improve Outcome: Not Progressing Goal: Cardiovascular complication will be avoided Outcome: Not Progressing   Problem: Activity: Goal: Risk for activity intolerance will decrease Outcome: Not Progressing   Problem: Nutrition: Goal: Adequate nutrition will be maintained Outcome: Not Progressing   Problem: Coping: Goal: Level of anxiety will decrease Outcome: Not Progressing   Problem: Elimination: Goal: Will not experience complications related to bowel motility Outcome: Not Progressing Goal: Will not experience complications related to urinary retention Outcome: Not Progressing   Problem: Pain Managment: Goal: General experience of comfort will improve and/or be controlled Outcome: Not Progressing   Problem: Safety: Goal: Ability to remain free from injury will improve Outcome: Not Progressing   Problem: Skin Integrity: Goal: Risk for impaired skin integrity will decrease Outcome: Not Progressing   Problem: Education: Goal: Knowledge of the prescribed therapeutic regimen will improve Outcome: Not Progressing   Problem: Coping: Goal: Ability to identify and develop effective coping behavior will improve Outcome: Not Progressing   Problem: Clinical Measurements: Goal: Quality of life will improve Outcome: Not Progressing   Problem: Respiratory: Goal: Verbalizations of increased ease of respirations will increase Outcome: Not Progressing   Problem: Role Relationship: Goal: Family's ability to cope with current situation will improve Outcome: Not Progressing Goal: Ability to verbalize concerns, feelings, and thoughts to partner or family member will improve Outcome: Not Progressing   Problem: Pain Management: Goal: Satisfaction with pain management regimen will improve Outcome: Not Progressing

## 2024-01-17 DIAGNOSIS — I619 Nontraumatic intracerebral hemorrhage, unspecified: Secondary | ICD-10-CM | POA: Diagnosis not present

## 2024-01-17 NOTE — TOC Progression Note (Signed)
Transition of Care [facility]) - Progression Note    Patient Details  Name: Penny [name] MRN: [id number] Date of Birth: 08-Feb-1933  Transition of [facility]) CM/SW Contact  Marliss Coots, LCSW Phone Number: 01/17/2024, 11:41 AM  Clinical Narrative:     11:41 AM Per Efraim Kaufmann of [facility], [facility] does not have bed availability for discharge today.   Expected Discharge Plan: Hospice Medical Facility Barriers to Discharge: Hospice Bed not available  Expected Discharge Plan and Services In-house Referral: Clinical Social Work, Hospice / Palliative Care   Post Acute Care Choice: Hospice Living arrangements for the past 2 months: Single Family Home                                       Social Determinants of Health (SDOH) Interventions SDOH Screenings   Food Insecurity: Patient Unable To Answer (01/16/2024)  Housing: Patient Unable To Answer (01/16/2024)  Transportation Needs: Patient Unable To Answer (01/16/2024)  Utilities: Patient Unable To Answer (01/16/2024)  Depression (PHQ2-9): Medium Risk (05/14/2023)  Social Connections: Patient Unable To Answer (01/16/2024)  Tobacco Use: Medium Risk (12/28/2023)    Readmission Risk Interventions     No data to display

## 2024-01-17 NOTE — Progress Notes (Addendum)
STROKE TEAM PROGRESS NOTE    SIGNIFICANT HOSPITAL EVENTS 2/11 patient admitted with right thalamic ICH ICH score 2 2/12 goals of care discussion had with family and decision made to transition patient to inpatient hospice  INTERIM HISTORY/SUBJECTIVE Patient has intermittently required Cleviprex overnight for blood pressure control.  Given previous level of function and quality of life as well as new disabilities expected with this ICH, goals of care discussion was had with patient's children.  Decision was made to discharge patient to inpatient hospice.  CODE STATUS was changed to DNR/DNI per agreement with family.  Current plan is to discharge to [facility] for hospice care when bed available. At this time no beds available.  OBJECTIVE  CBC    Component Value Date/Time   WBC 4.8 01/15/2024 0655   RBC 3.79 (L) 01/15/2024 0655   HGB 10.9 (L) 01/15/2024 0655   HGB 11.6 (L) 01/15/2024 0655   HCT 34.5 (L) 01/15/2024 0655   HCT 34.0 (L) 01/15/2024 0655   PLT 230 01/15/2024 0655   MCV 91.0 01/15/2024 0655   MCH 28.8 01/15/2024 0655   MCHC 31.6 01/15/2024 0655   RDW 14.8 01/15/2024 0655   LYMPHSABS 1.2 01/15/2024 0655   MONOABS 0.4 01/15/2024 0655   EOSABS 0.1 01/15/2024 0655   BASOSABS 0.0 01/15/2024 0655    BMET    Component Value Date/Time   NA 137 01/16/2024 0926   K 3.7 01/16/2024 0926   CL 100 01/16/2024 0926   CO2 26 01/16/2024 0926   GLUCOSE 161 (H) 01/16/2024 0926   BUN 15 01/16/2024 0926   CREATININE 0.64 01/16/2024 0926   CREATININE 0.67 10/29/2023 1113   CALCIUM 9.2 01/16/2024 0926   EGFR 84 10/29/2023 1113   GFRNONAA >60 01/16/2024 0926   GFRNONAA 59 (L) 02/07/2021 1450    IMAGING past 24 hours No results found.   Vitals:   01/16/24 1500 01/16/24 2031 01/16/24 2032 01/17/24 0801  BP: (!) 163/101 (!) 162/103 (!) 162/103 (!) 148/87  Pulse: (!) 117  (!) 139 68  Resp: 20  (!) 21   Temp:   99.7 F (37.6 C) 99.3 F (37.4 C)  TempSrc:      SpO2: 91%  96%  93%  Weight:         PHYSICAL EXAM General: Frail-appearing elderly patient in no acute distress Psych:  Mood and affect appropriate for situation CV: Regular rate and rhythm on monitor Respiratory:  Regular, unlabored respirations on room air  NEURO:  Mental Status:Eyes open, not tracking as much today. Appears comfortable Speech/Language: No verbal output  Cranial Nerves:  II: PERRL.  III, IV, VI: EOMI. Eyelids elevate symmetrically.  V: Sensation is intact to light touch and symmetrical to face.  VII: Left facial droop VIII: hearing intact to voice. IX, X: Voice is dysarthric XII: tongue is midline without fasciculations. Motor: Able to move right upper and lower extremities with antigravity strength, does not move left upper extremity, able to move left lower extremity but does not break gravity Tone: is normal and bulk is normal Sensation- Intact to light touch on the right, left-sided neglect present Coordination: Unable to perform Gait- deferred  ASSESSMENT/PLAN  [name] is a 89 y.o. female with history of cerebral amyloid angiopathy and recent ICH admitted for recurrent right-sided cortically-based ICH.  Patient had been discharged from a rehabilitation facility about 3 weeks ago per family and had been doing relatively well.  However, on exam today, she exhibits severe left-sided deficits  and neglect.  Given significant disability and likely poor quality of life, discussion was had with patient's family regarding goals of care, and decision was made to transition patient to inpatient hospice.  NIH on Admission 16  Intracerebral Hemorrhage:  right frontal ICH with IVH Etiology: Cerebral amyloid angiopathy Code Stroke CT head 35 cc parasagittal right frontal ICH with IVH, no hydrocephalus LDL 182 HgbA1c 5.9 Continue Keppra for seizure prophylaxis Continue morphine, haloperidol, lorazepam and oxycodone as needed for comfort VTE prophylaxis -SCDs aspirin  81 mg daily prior to admission, now on No antithrombotic secondary to ICH Disposition: Inpatient hospice at [facility]  Hx of Stroke/TIA Patient has cerebral amyloid angiopathy and had another ICH in 12/24  Hypertension Home meds: None No blood pressure goal at this time, patient on comfort care  Dysphagia Patient has post-stroke dysphagia, SLP consulted    Diet   DIET DYS 3 Room service appropriate? Yes; Fluid consistency: Thin   Advance diet as tolerated  Other Stroke Risk Factors None   Other Active Problems None  Hospital day # 2  Patient seen and examined by NP/APP with MD. MD to update note as needed.   Elmer Picker, DNP, FNP-BC [facility] Pager: [phone]  I have personally obtained history,examined this patient, reviewed notes, independently viewed imaging studies, participated in medical decision making and plan of care.ROS completed by me personally and pertinent positives fully documented  I have made any additions or clarifications directly to the above note. Agree with note above.  Patient remains full comfort care measures and appears to be resting peacefully.  No bed available for transfer to [facility] yet.  Long discussion with daughter at the bedside and answered questions.  Delia Heady, MD Medical Director [facility] Stroke [facility] Pager: [phone] 01/17/2024 3:50 PM  To contact Stroke Continuity provider, please refer to [email or web address]. After hours, contact General Neurology

## 2024-01-17 NOTE — Progress Notes (Signed)
[facility] Liaison Note   There are no routine beds available at [facility] at this time.    Hospital liaison will follow up tomorrow or sooner if a room becomes available.    Please do not hesitate to call with questions.    Henderson Newcomer, LPN [facility] Hospice Liaison [phone]

## 2024-01-17 NOTE — Plan of Care (Signed)
Problem: Education: Goal: Knowledge of General Education information will improve Description: Including pain rating scale, medication(s)/side effects and non-pharmacologic comfort measures Outcome: Progressing   Problem: Health Behavior/Discharge Planning: Goal: Ability to manage health-related needs will improve Outcome: Progressing   Problem: Clinical Measurements: Goal: Ability to maintain clinical measurements within normal limits will improve Outcome: Progressing   Problem: Clinical Measurements: Goal: Will remain free from infection Outcome: Progressing   Problem: Clinical Measurements: Goal: Diagnostic test results will improve Outcome: Progressing   Problem: Clinical Measurements: Goal: Respiratory complications will improve Outcome: Progressing   Problem: Clinical Measurements: Goal: Cardiovascular complication will be avoided Outcome: Progressing   Problem: Activity: Goal: Risk for activity intolerance will decrease Outcome: Progressing   Problem: Nutrition: Goal: Adequate nutrition will be maintained Outcome: Progressing   Problem: Coping: Goal: Level of anxiety will decrease Outcome: Progressing   Problem: Elimination: Goal: Will not experience complications related to bowel motility Outcome: Progressing   Problem: Pain Managment: Goal: General experience of comfort will improve Outcome: Progressing   Problem: Safety: Goal: Ability to remain free from injury will improve Outcome: Progressing   Problem: Skin Integrity: Goal: Risk for impaired skin integrity will decrease Outcome: Progressing   Problem: Activity: Goal: Ability to tolerate increased activity will improve Outcome: Progressing   Problem: Clinical Measurements: Goal: Ability to maintain a body temperature in the normal range will improve Outcome: Progressing   Problem: Respiratory: Goal: Ability to maintain adequate ventilation will improve Outcome: Progressing   Problem:  Respiratory: Goal: Ability to maintain a clear airway will improve Outcome: Progressing   Problem: Urinary Elimination: Goal: Signs and symptoms of infection will decrease Outcome: Progressing   Problem: Education: Goal: Knowledge of the prescribed therapeutic regimen will improve Outcome: Progressing   Problem: Coping: Goal: Ability to identify and develop effective coping behavior will improve Outcome: Progressing   Problem: Clinical Measurements: Goal: Quality of life will improve Outcome: Progressing   Problem: Respiratory: Goal: Verbalizations of increased ease of respirations will increase Outcome: Progressing   Problem: Role Relationship: Goal: Family's ability to cope with current situation will improve Outcome: Progressing   Problem: Role Relationship: Goal: Ability to verbalize concerns, feelings, and thoughts to partner or family member will improve Outcome: Progressing   Problem: Pain Management: Goal: Satisfaction with pain management regimen will improve Outcome: Progressing

## 2024-01-17 NOTE — Plan of Care (Signed)
Patient responds to sensation, UTA patient level of orientation. Patient shows signs of shallow breathing and comfort care measures remain in place. Patient son at bedside. Pending placement to hospice facility.   Problem: Education: Goal: Knowledge of disease or condition will improve Outcome: Progressing   Problem: Education: Goal: Knowledge of secondary prevention will improve (MUST DOCUMENT ALL) Outcome: Progressing   Problem: Education: Goal: Knowledge of patient specific risk factors will improve (DELETE if not current risk factor) Outcome: Progressing   Problem: Intracerebral Hemorrhage Tissue Perfusion: Goal: Complications of Intracerebral Hemorrhage will be minimized Outcome: Progressing   Problem: Coping: Goal: Will verbalize positive feelings about self Outcome: Progressing   Problem: Coping: Goal: Will identify appropriate support needs Outcome: Progressing   Problem: Health Behavior/Discharge Planning: Goal: Ability to manage health-related needs will improve Outcome: Progressing   Problem: Health Behavior/Discharge Planning: Goal: Goals will be collaboratively established with patient/family Outcome: Progressing   Problem: Self-Care: Goal: Ability to participate in self-care as condition permits will improve Outcome: Progressing   Problem: Self-Care: Goal: Verbalization of feelings and concerns over difficulty with self-care will improve Outcome: Progressing   Problem: Self-Care: Goal: Ability to communicate needs accurately will improve Outcome: Progressing   Problem: Nutrition: Goal: Risk of aspiration will decrease Outcome: Progressing   Problem: Nutrition: Goal: Dietary intake will improve Outcome: Progressing   Problem: Education: Goal: Knowledge of General Education information will improve Description: Including pain rating scale, medication(s)/side effects and non-pharmacologic comfort measures Outcome: Progressing   Problem: Health  Behavior/Discharge Planning: Goal: Ability to manage health-related needs will improve Outcome: Progressing   Problem: Clinical Measurements: Goal: Ability to maintain clinical measurements within normal limits will improve Outcome: Progressing   Problem: Clinical Measurements: Goal: Will remain free from infection Outcome: Progressing   Problem: Clinical Measurements: Goal: Diagnostic test results will improve Outcome: Progressing   Problem: Clinical Measurements: Goal: Respiratory complications will improve Outcome: Progressing   Problem: Clinical Measurements: Goal: Cardiovascular complication will be avoided Outcome: Progressing   Problem: Activity: Goal: Risk for activity intolerance will decrease Outcome: Progressing   Problem: Nutrition: Goal: Adequate nutrition will be maintained Outcome: Progressing   Problem: Coping: Goal: Level of anxiety will decrease Outcome: Progressing   Problem: Elimination: Goal: Will not experience complications related to bowel motility Outcome: Progressing   Problem: Elimination: Goal: Will not experience complications related to urinary retention Outcome: Progressing   Problem: Pain Managment: Goal: General experience of comfort will improve and/or be controlled Outcome: Progressing   Problem: Safety: Goal: Ability to remain free from injury will improve Outcome: Progressing   Problem: Skin Integrity: Goal: Risk for impaired skin integrity will decrease Outcome: Progressing   Problem: Education: Goal: Knowledge of the prescribed therapeutic regimen will improve Outcome: Progressing   Problem: Coping: Goal: Ability to identify and develop effective coping behavior will improve Outcome: Progressing   Problem: Clinical Measurements: Goal: Quality of life will improve Outcome: Progressing   Problem: Respiratory: Goal: Verbalizations of increased ease of respirations will increase Outcome: Progressing   Problem:  Role Relationship: Goal: Family's ability to cope with current situation will improve Outcome: Progressing   Problem: Role Relationship: Goal: Ability to verbalize concerns, feelings, and thoughts to partner or family member will improve Outcome: Progressing   Problem: Pain Management: Goal: Satisfaction with pain management regimen will improve Outcome: Progressing

## 2024-01-18 DIAGNOSIS — I619 Nontraumatic intracerebral hemorrhage, unspecified: Secondary | ICD-10-CM | POA: Diagnosis not present

## 2024-01-18 NOTE — Care Management Important Message (Signed)
Important Message  Patient Details  Name: Penny [name] MRN: [id number] Date of Birth: Dec 02, 1933   Important Message Given:  Yes - Medicare IM     Dorena Bodo 01/18/2024, 3:19 PM

## 2024-01-18 NOTE — Progress Notes (Addendum)
STROKE TEAM PROGRESS NOTE    SIGNIFICANT HOSPITAL EVENTS 2/11 patient admitted with right thalamic ICH ICH score 2 2/12 goals of care discussion had with family and decision made to transition patient to inpatient hospice  INTERIM HISTORY/SUBJECTIVE Patient appears to be comfortable and is still awaiting a hospice bed.  Family remains at the bedside.  OBJECTIVE  CBC    Component Value Date/Time   WBC 4.8 01/15/2024 0655   RBC 3.79 (L) 01/15/2024 0655   HGB 10.9 (L) 01/15/2024 0655   HGB 11.6 (L) 01/15/2024 0655   HCT 34.5 (L) 01/15/2024 0655   HCT 34.0 (L) 01/15/2024 0655   PLT 230 01/15/2024 0655   MCV 91.0 01/15/2024 0655   MCH 28.8 01/15/2024 0655   MCHC 31.6 01/15/2024 0655   RDW 14.8 01/15/2024 0655   LYMPHSABS 1.2 01/15/2024 0655   MONOABS 0.4 01/15/2024 0655   EOSABS 0.1 01/15/2024 0655   BASOSABS 0.0 01/15/2024 0655    BMET    Component Value Date/Time   NA 137 01/16/2024 0926   K 3.7 01/16/2024 0926   CL 100 01/16/2024 0926   CO2 26 01/16/2024 0926   GLUCOSE 161 (H) 01/16/2024 0926   BUN 15 01/16/2024 0926   CREATININE 0.64 01/16/2024 0926   CREATININE 0.67 10/29/2023 1113   CALCIUM 9.2 01/16/2024 0926   EGFR 84 10/29/2023 1113   GFRNONAA >60 01/16/2024 0926   GFRNONAA 59 (L) 02/07/2021 1450    IMAGING past 24 hours No results found.   Vitals:   01/17/24 2139 01/17/24 2317 01/18/24 0751 01/18/24 1100  BP:   114/83   Pulse:   (!) 126   Resp: (!) 35 (!) 23 20 (!) 23  Temp:   98.4 F (36.9 C)   TempSrc:   Oral   SpO2:   (!) 84%   Weight:         PHYSICAL EXAM General: Frail-appearing elderly patient in no acute distress Psych:  Mood and affect appropriate for situation Respiratory:  Regular, mildly labored respirations on room air  NEURO:  Mental Status: Patient rests with eyes closed, does not respond to voice or follow commands appears comfortable Speech/Language: No verbal output   ASSESSMENT/PLAN  Penny [name] is a 89  y.o. female with history of cerebral amyloid angiopathy and recent ICH admitted for recurrent right-sided cortically-based ICH.  Patient had been discharged from a rehabilitation facility about 3 weeks ago per family and had been doing relatively well.  However, on exam today, she exhibits severe left-sided deficits and neglect.  Given significant disability and likely poor quality of life, discussion was had with patient's family regarding goals of care, and decision was made to transition patient to inpatient hospice.  NIH on Admission 16  Intracerebral Hemorrhage:  right frontal ICH with IVH Etiology: Cerebral amyloid angiopathy Code Stroke CT head 35 cc parasagittal right frontal ICH with IVH, no hydrocephalus LDL 182 HgbA1c 5.9 Continue Keppra for seizure prophylaxis Continue morphine, haloperidol, lorazepam and oxycodone as needed for comfort VTE prophylaxis -SCDs aspirin 81 mg daily prior to admission, now on No antithrombotic secondary to ICH Disposition: Inpatient hospice at [facility]  Hx of Stroke/TIA Patient has cerebral amyloid angiopathy and had another ICH in 12/24  Hypertension Home meds: None No blood pressure goal at this time, patient on comfort care  Dysphagia Patient has post-stroke dysphagia, SLP consulted    Diet   DIET DYS 3 Room service appropriate? Yes; Fluid consistency: Thin   Advance diet  as tolerated  Other Stroke Risk Factors None   Other Active Problems None  Hospital day # 3  Patient seen and examined by NP/APP with MD. MD to update note as needed.   Cortney E Ernestina Columbia , MSN, AGACNP-BC [facility] See Amion for schedule and pager information 01/18/2024 3:52 PM  I have personally obtained history,examined this patient, reviewed notes, independently viewed imaging studies, participated in medical decision making and plan of care.ROS completed by me personally and pertinent positives fully documented  I have made any additions  or clarifications directly to the above note. Agree with note above.  Discussed with daughter at the bedside.  Await nursing home hospice.  And patient stable for discharge when bed available  Delia Heady, MD Medical Director [facility] Pager: [phone] 01/18/2024 4:27 PM   To contact Stroke Continuity provider, please refer to [email or web address]. After hours, contact General Neurology

## 2024-01-18 NOTE — Progress Notes (Signed)
[facility] Liaison Note   There are no routine beds available at [facility] at this time.    Hospital liaison will follow up tomorrow or sooner if a room becomes available.    Please do not hesitate to call with questions.    Henderson Newcomer, LPN [facility] Hospice Liaison [phone]

## 2024-01-18 NOTE — TOC Progression Note (Signed)
Transition of Care [facility]) - Progression Note    Patient Details  Name: Penny [name] MRN: [id number] Date of Birth: September 12, 1933  Transition of Care [facility] CM/SW Contact  Marliss Coots, LCSW Phone Number: 01/18/2024, 11:45 AM  Clinical Narrative:     11:45 AM Per [facility], [facility] does not have bed availability for discharge today but will continue to follow. Medical team is aware of discharge barrier.  Expected Discharge Plan: Hospice Medical Facility Barriers to Discharge: Hospice Bed not available  Expected Discharge Plan and Services In-house Referral: Clinical Social Work, Hospice / Palliative Care   Post Acute Care Choice: Hospice Living arrangements for the past 2 months: Single Family Home                                       Social Determinants of Health (SDOH) Interventions SDOH Screenings   Food Insecurity: Patient Unable To Answer (01/16/2024)  Housing: Patient Unable To Answer (01/16/2024)  Transportation Needs: Patient Unable To Answer (01/16/2024)  Utilities: Patient Unable To Answer (01/16/2024)  Depression (PHQ2-9): Medium Risk (05/14/2023)  Social Connections: Patient Unable To Answer (01/16/2024)  Tobacco Use: Medium Risk (12/28/2023)    Readmission Risk Interventions     No data to display

## 2024-01-18 NOTE — Plan of Care (Signed)
  Problem: Education: Goal: Knowledge of disease or condition will improve Outcome: Progressing Goal: Knowledge of secondary prevention will improve (MUST DOCUMENT ALL) Outcome: Progressing Goal: Knowledge of patient specific risk factors will improve (DELETE if not current risk factor) Outcome: Progressing   Problem: Intracerebral Hemorrhage Tissue Perfusion: Goal: Complications of Intracerebral Hemorrhage will be minimized Outcome: Progressing   Problem: Coping: Goal: Will verbalize positive feelings about self Outcome: Progressing Goal: Will identify appropriate support needs Outcome: Progressing   Problem: Health Behavior/Discharge Planning: Goal: Ability to manage health-related needs will improve Outcome: Progressing Goal: Goals will be collaboratively established with patient/family Outcome: Progressing   Problem: Self-Care: Goal: Ability to participate in self-care as condition permits will improve Outcome: Progressing Goal: Verbalization of feelings and concerns over difficulty with self-care will improve Outcome: Progressing Goal: Ability to communicate needs accurately will improve Outcome: Progressing   Problem: Nutrition: Goal: Risk of aspiration will decrease Outcome: Progressing Goal: Dietary intake will improve Outcome: Progressing   Problem: Education: Goal: Knowledge of General Education information will improve Description: Including pain rating scale, medication(s)/side effects and non-pharmacologic comfort measures Outcome: Progressing   Problem: Health Behavior/Discharge Planning: Goal: Ability to manage health-related needs will improve Outcome: Progressing   Problem: Clinical Measurements: Goal: Ability to maintain clinical measurements within normal limits will improve Outcome: Progressing Goal: Will remain free from infection Outcome: Progressing Goal: Diagnostic test results will improve Outcome: Progressing Goal: Respiratory  complications will improve Outcome: Progressing Goal: Cardiovascular complication will be avoided Outcome: Progressing   Problem: Activity: Goal: Risk for activity intolerance will decrease Outcome: Progressing   Problem: Nutrition: Goal: Adequate nutrition will be maintained Outcome: Progressing   Problem: Coping: Goal: Level of anxiety will decrease Outcome: Progressing   Problem: Elimination: Goal: Will not experience complications related to bowel motility Outcome: Progressing Goal: Will not experience complications related to urinary retention Outcome: Progressing   Problem: Pain Managment: Goal: General experience of comfort will improve and/or be controlled Outcome: Progressing   Problem: Safety: Goal: Ability to remain free from injury will improve Outcome: Progressing   Problem: Skin Integrity: Goal: Risk for impaired skin integrity will decrease Outcome: Progressing   Problem: Education: Goal: Knowledge of the prescribed therapeutic regimen will improve Outcome: Progressing   Problem: Coping: Goal: Ability to identify and develop effective coping behavior will improve Outcome: Progressing   Problem: Clinical Measurements: Goal: Quality of life will improve Outcome: Progressing   Problem: Respiratory: Goal: Verbalizations of increased ease of respirations will increase Outcome: Progressing   Problem: Role Relationship: Goal: Family's ability to cope with current situation will improve Outcome: Progressing Goal: Ability to verbalize concerns, feelings, and thoughts to partner or family member will improve Outcome: Progressing   Problem: Pain Management: Goal: Satisfaction with pain management regimen will improve Outcome: Progressing

## 2024-01-19 DIAGNOSIS — I619 Nontraumatic intracerebral hemorrhage, unspecified: Secondary | ICD-10-CM | POA: Diagnosis not present

## 2024-01-19 NOTE — Plan of Care (Signed)
  Problem: Health Behavior/Discharge Planning: Goal: Goals will be collaboratively established with patient/family Outcome: Progressing   Problem: Clinical Measurements: Goal: Will remain free from infection Outcome: Progressing Goal: Diagnostic test results will improve Outcome: Progressing   Problem: Safety: Goal: Ability to remain free from injury will improve Outcome: Progressing   Problem: Skin Integrity: Goal: Risk for impaired skin integrity will decrease Outcome: Progressing   Problem: Role Relationship: Goal: Family's ability to cope with current situation will improve Outcome: Progressing

## 2024-01-19 NOTE — Progress Notes (Addendum)
 STROKE TEAM PROGRESS NOTE    SIGNIFICANT HOSPITAL EVENTS 2/11 patient admitted with right thalamic ICH ICH score 2 2/12 goals of care discussion had with family and decision made to transition patient to inpatient hospice  INTERIM HISTORY/SUBJECTIVE Patient is seen in her room with no family at the bedside.  She appears comfortable at this time and continues to await an inpatient hospice bed.  Vital signs have been stable. OBJECTIVE  CBC    Component Value Date/Time   WBC 4.8 01/15/2024 0655   RBC 3.79 (L) 01/15/2024 0655   HGB 10.9 (L) 01/15/2024 0655   HGB 11.6 (L) 01/15/2024 0655   HCT 34.5 (L) 01/15/2024 0655   HCT 34.0 (L) 01/15/2024 0655   PLT 230 01/15/2024 0655   MCV 91.0 01/15/2024 0655   MCH 28.8 01/15/2024 0655   MCHC 31.6 01/15/2024 0655   RDW 14.8 01/15/2024 0655   LYMPHSABS 1.2 01/15/2024 0655   MONOABS 0.4 01/15/2024 0655   EOSABS 0.1 01/15/2024 0655   BASOSABS 0.0 01/15/2024 0655    BMET    Component Value Date/Time   NA 137 01/16/2024 0926   K 3.7 01/16/2024 0926   CL 100 01/16/2024 0926   CO2 26 01/16/2024 0926   GLUCOSE 161 (H) 01/16/2024 0926   BUN 15 01/16/2024 0926   CREATININE 0.64 01/16/2024 0926   CREATININE 0.67 10/29/2023 1113   CALCIUM 9.2 01/16/2024 0926   EGFR 84 10/29/2023 1113   GFRNONAA >60 01/16/2024 0926   GFRNONAA 59 (L) 02/07/2021 1450    IMAGING past 24 hours No results found.   Vitals:   01/18/24 0751 01/18/24 1100 01/18/24 2105 01/19/24 0801  BP: 114/83  111/69 122/72  Pulse: (!) 126  (!) 108 (!) 103  Resp: 20 (!) 23 20 16   Temp: 98.4 F (36.9 C)  97.9 F (36.6 C) (!) 97.4 F (36.3 C)  TempSrc: Oral  Oral Oral  SpO2: (!) 84%  (!) 86% 90%  Weight:         PHYSICAL EXAM General: Frail-appearing elderly patient in no acute distress Psych:  Mood and affect appropriate for situation Respiratory:  Regular, mildly labored respirations on room air  NEURO:  Mental Status: Patient rests with eyes closed, does not  respond to voice or follow commands, appears comfortable Speech/Language: No verbal output   ASSESSMENT/PLAN  Penny [name] is a 89 y.o. female with history of cerebral amyloid angiopathy and recent ICH admitted for recurrent right-sided cortically-based ICH.  Patient had been discharged from a rehabilitation facility about 3 weeks ago per family and had been doing relatively well.  However, on exam today, she exhibits severe left-sided deficits and neglect.  Given significant disability and likely poor quality of life, discussion was had with patient's family regarding goals of care, and decision was made to transition patient to inpatient hospice.  NIH on Admission 16  Intracerebral Hemorrhage:  right frontal ICH with IVH Etiology: Cerebral amyloid angiopathy Code Stroke CT head 35 cc parasagittal right frontal ICH with IVH, no hydrocephalus LDL 182 HgbA1c 5.9 Continue Keppra for seizure prophylaxis Continue morphine, haloperidol, lorazepam and oxycodone as needed for comfort VTE prophylaxis -SCDs aspirin 81 mg daily prior to admission, now on No antithrombotic secondary to ICH Disposition: Inpatient hospice at [facility]  Hx of Stroke/TIA Patient has cerebral amyloid angiopathy and had another ICH in 12/24  Hypertension Home meds: None No blood pressure goal at this time, patient on comfort care  Dysphagia Patient has post-stroke dysphagia,  SLP consulted    Diet   DIET DYS 3 Room service appropriate? Yes; Fluid consistency: Thin   Advance diet as tolerated  Other Stroke Risk Factors None   Other Active Problems None  Hospital day # 4  Patient seen and examined by NP/APP with MD. MD to update note as needed.   Cortney E Ernestina Columbia , MSN, AGACNP-BC [facility] See Amion for schedule and pager information 01/19/2024 1:54 PM I have personally obtained history,examined this patient, reviewed notes, independently viewed imaging studies,  participated in medical decision making and plan of care.ROS completed by me personally and pertinent positives fully documented  I have made any additions or clarifications directly to the above note. Agree with note above.  Patient medically stable to be transferred to hospice nursing bed when available.  No family at the bedside.  Penny Heady, MD Medical Director [facility] Pager: [phone] 01/19/2024 4:10 PM  To contact Stroke Continuity provider, please refer to [email or web address]. After hours, contact General Neurology

## 2024-01-19 NOTE — Progress Notes (Signed)
 [facility] [phone] [facility] Liaison Note:   Referral received for [facility].    At this time [facility] does not have any beds to offer. We will follow up for evaluation of appropriateness once a bed becomes available.    Thank you for the opportunity to participate in this patient's plan of care.   Roe Rutherford, BSN, [employer] [phone]

## 2024-01-19 NOTE — Plan of Care (Signed)
  Problem: Education: Goal: Knowledge of disease or condition will improve Outcome: Progressing Goal: Knowledge of secondary prevention will improve (MUST DOCUMENT ALL) Outcome: Progressing Goal: Knowledge of patient specific risk factors will improve (DELETE if not current risk factor) Outcome: Progressing   Problem: Intracerebral Hemorrhage Tissue Perfusion: Goal: Complications of Intracerebral Hemorrhage will be minimized Outcome: Progressing   Problem: Coping: Goal: Will verbalize positive feelings about self Outcome: Progressing Goal: Will identify appropriate support needs Outcome: Progressing   Problem: Health Behavior/Discharge Planning: Goal: Ability to manage health-related needs will improve Outcome: Progressing Goal: Goals will be collaboratively established with patient/family Outcome: Progressing   Problem: Self-Care: Goal: Ability to participate in self-care as condition permits will improve Outcome: Progressing Goal: Verbalization of feelings and concerns over difficulty with self-care will improve Outcome: Progressing Goal: Ability to communicate needs accurately will improve Outcome: Progressing   Problem: Nutrition: Goal: Risk of aspiration will decrease Outcome: Progressing Goal: Dietary intake will improve Outcome: Progressing   Problem: Education: Goal: Knowledge of General Education information will improve Description: Including pain rating scale, medication(s)/side effects and non-pharmacologic comfort measures Outcome: Progressing   Problem: Health Behavior/Discharge Planning: Goal: Ability to manage health-related needs will improve Outcome: Progressing   Problem: Clinical Measurements: Goal: Ability to maintain clinical measurements within normal limits will improve Outcome: Progressing Goal: Will remain free from infection Outcome: Progressing Goal: Diagnostic test results will improve Outcome: Progressing Goal: Respiratory  complications will improve Outcome: Progressing Goal: Cardiovascular complication will be avoided Outcome: Progressing   Problem: Activity: Goal: Risk for activity intolerance will decrease Outcome: Progressing   Problem: Nutrition: Goal: Adequate nutrition will be maintained Outcome: Progressing   Problem: Elimination: Goal: Will not experience complications related to bowel motility Outcome: Progressing Goal: Will not experience complications related to urinary retention Outcome: Progressing   Problem: Pain Managment: Goal: General experience of comfort will improve and/or be controlled Outcome: Progressing   Problem: Safety: Goal: Ability to remain free from injury will improve Outcome: Progressing   Problem: Skin Integrity: Goal: Risk for impaired skin integrity will decrease Outcome: Progressing   Problem: Education: Goal: Knowledge of the prescribed therapeutic regimen will improve Outcome: Progressing   Problem: Coping: Goal: Ability to identify and develop effective coping behavior will improve Outcome: Progressing   Problem: Clinical Measurements: Goal: Quality of life will improve Outcome: Progressing   Problem: Respiratory: Goal: Verbalizations of increased ease of respirations will increase Outcome: Progressing   Problem: Pain Management: Goal: Satisfaction with pain management regimen will improve Outcome: Progressing

## 2024-02-02 NOTE — Final Progress Note (Addendum)
 Patient expired.  Time of death called at 92 by Cortney Ernestina Columbia NP and Alroy Dust RN.  Called patient's daughter and informed. Daughter on way in.      Contacted SWOT nurse to assist with process.

## 2024-02-02 NOTE — Death Summary Note (Signed)
 Patient ID: Penny [name] MRN: [id number] DOB/AGE: [birth year] 88 y.o.  Admit date: 17-Jan-2024 Death date: Jan 22, 2024  Admission Diagnoses:Intracerebral Hemorrhage:  right frontal ICH with IVH Etiology: Cerebral amyloid angiopathy  Cause of Death: Right frontal intracerebral hemorrhage  Pertinent Medical Diagnosis: Principal Problem:   ICH (intracerebral hemorrhage) [facility])   Hospital Course: Patient with history of cerebral amyloid angiopathy and recent intracerebral hemorrhage who had just been discharged from rehabilitation facility was admitted on 01/17/2024 with acute onset flaccid paralysis of the left side as well as left hemianopia.  Given diagnosis of cerebral amyloid angiopathy, recurrent hemorrhagic strokes and patient's current symptoms with significant disability, goals of care discussion was had with family.  Decision was made to transition patient to comfort care.  Comfort care was initiated, and patient died at 42 on 01/22/2024.  Signed: Marjorie Smolder 01-22-2024, 1:26 PM

## 2024-02-02 NOTE — Plan of Care (Signed)
  Problem: Education: Goal: Knowledge of disease or condition will improve Outcome: Progressing Goal: Knowledge of secondary prevention will improve (MUST DOCUMENT ALL) Outcome: Progressing Goal: Knowledge of patient specific risk factors will improve (DELETE if not current risk factor) Outcome: Progressing   Problem: Intracerebral Hemorrhage Tissue Perfusion: Goal: Complications of Intracerebral Hemorrhage will be minimized Outcome: Progressing   Problem: Coping: Goal: Will verbalize positive feelings about self Outcome: Progressing Goal: Will identify appropriate support needs Outcome: Progressing   Problem: Health Behavior/Discharge Planning: Goal: Ability to manage health-related needs will improve Outcome: Progressing Goal: Goals will be collaboratively established with patient/family Outcome: Progressing   Problem: Self-Care: Goal: Ability to participate in self-care as condition permits will improve Outcome: Progressing Goal: Verbalization of feelings and concerns over difficulty with self-care will improve Outcome: Progressing Goal: Ability to communicate needs accurately will improve Outcome: Progressing   Problem: Nutrition: Goal: Risk of aspiration will decrease Outcome: Progressing Goal: Dietary intake will improve Outcome: Progressing   Problem: Education: Goal: Knowledge of General Education information will improve Description: Including pain rating scale, medication(s)/side effects and non-pharmacologic comfort measures Outcome: Progressing   Problem: Health Behavior/Discharge Planning: Goal: Ability to manage health-related needs will improve Outcome: Progressing   Problem: Clinical Measurements: Goal: Ability to maintain clinical measurements within normal limits will improve Outcome: Progressing Goal: Will remain free from infection Outcome: Progressing Goal: Diagnostic test results will improve Outcome: Progressing Goal: Respiratory  complications will improve Outcome: Progressing Goal: Cardiovascular complication will be avoided Outcome: Progressing   Problem: Activity: Goal: Risk for activity intolerance will decrease Outcome: Progressing   Problem: Nutrition: Goal: Adequate nutrition will be maintained Outcome: Progressing   Problem: Coping: Goal: Level of anxiety will decrease Outcome: Progressing   Problem: Elimination: Goal: Will not experience complications related to bowel motility Outcome: Progressing Goal: Will not experience complications related to urinary retention Outcome: Progressing   Problem: Pain Managment: Goal: General experience of comfort will improve and/or be controlled Outcome: Progressing   Problem: Safety: Goal: Ability to remain free from injury will improve Outcome: Progressing   Problem: Skin Integrity: Goal: Risk for impaired skin integrity will decrease Outcome: Progressing   Problem: Education: Goal: Knowledge of the prescribed therapeutic regimen will improve Outcome: Progressing   Problem: Coping: Goal: Ability to identify and develop effective coping behavior will improve Outcome: Progressing   Problem: Clinical Measurements: Goal: Quality of life will improve Outcome: Progressing   Problem: Respiratory: Goal: Verbalizations of increased ease of respirations will increase Outcome: Progressing   Problem: Role Relationship: Goal: Family's ability to cope with current situation will improve Outcome: Progressing Goal: Ability to verbalize concerns, feelings, and thoughts to partner or family member will improve Outcome: Progressing   Problem: Pain Management: Goal: Satisfaction with pain management regimen will improve Outcome: Progressing

## 2024-02-02 DEATH — deceased

## 2024-03-11 ENCOUNTER — Ambulatory Visit: Payer: Medicare Other | Admitting: Adult Health

## 2024-04-30 ENCOUNTER — Ambulatory Visit: Payer: Medicare Other | Admitting: Nurse Practitioner

## 2024-10-28 ENCOUNTER — Encounter: Payer: Medicare Other | Admitting: Nurse Practitioner
